# Patient Record
Sex: Female | Born: 1955
Health system: Southern US, Community
[De-identification: ages and names within clinical notes are randomized; demographics above are authoritative.]

## PROBLEM LIST (undated history)

## (undated) DIAGNOSIS — Z9221 Personal history of antineoplastic chemotherapy: Secondary | ICD-10-CM

## (undated) DIAGNOSIS — C787 Secondary malignant neoplasm of liver and intrahepatic bile duct: Secondary | ICD-10-CM

## (undated) DIAGNOSIS — Z86718 Personal history of other venous thrombosis and embolism: Secondary | ICD-10-CM

## (undated) DIAGNOSIS — G971 Other reaction to spinal and lumbar puncture: Secondary | ICD-10-CM

## (undated) DIAGNOSIS — Z933 Colostomy status: Secondary | ICD-10-CM

## (undated) DIAGNOSIS — K219 Gastro-esophageal reflux disease without esophagitis: Secondary | ICD-10-CM

## (undated) DIAGNOSIS — F32A Depression, unspecified: Secondary | ICD-10-CM

## (undated) DIAGNOSIS — Z978 Presence of other specified devices: Secondary | ICD-10-CM

## (undated) DIAGNOSIS — C19 Malignant neoplasm of rectosigmoid junction: Principal | ICD-10-CM

## (undated) DIAGNOSIS — R339 Retention of urine, unspecified: Secondary | ICD-10-CM

## (undated) DIAGNOSIS — Z7901 Long term (current) use of anticoagulants: Secondary | ICD-10-CM

## (undated) DIAGNOSIS — Z8719 Personal history of other diseases of the digestive system: Secondary | ICD-10-CM

## (undated) DIAGNOSIS — R0600 Dyspnea, unspecified: Secondary | ICD-10-CM

## (undated) DIAGNOSIS — Z86711 Personal history of pulmonary embolism: Secondary | ICD-10-CM

## (undated) DIAGNOSIS — Z8619 Personal history of other infectious and parasitic diseases: Secondary | ICD-10-CM

## (undated) DIAGNOSIS — D509 Iron deficiency anemia, unspecified: Secondary | ICD-10-CM

## (undated) DIAGNOSIS — I890 Lymphedema, not elsewhere classified: Secondary | ICD-10-CM

## (undated) DIAGNOSIS — Z872 Personal history of diseases of the skin and subcutaneous tissue: Secondary | ICD-10-CM

## (undated) DIAGNOSIS — M549 Dorsalgia, unspecified: Secondary | ICD-10-CM

## (undated) DIAGNOSIS — R21 Rash and other nonspecific skin eruption: Secondary | ICD-10-CM

## (undated) DIAGNOSIS — Z960 Presence of urogenital implants: Secondary | ICD-10-CM

## (undated) DIAGNOSIS — Q625 Duplication of ureter: Secondary | ICD-10-CM

## (undated) DIAGNOSIS — G8929 Other chronic pain: Secondary | ICD-10-CM

## (undated) DIAGNOSIS — F329 Major depressive disorder, single episode, unspecified: Secondary | ICD-10-CM

## (undated) DIAGNOSIS — T17998A Other foreign object in respiratory tract, part unspecified causing other injury, initial encounter: Secondary | ICD-10-CM

## (undated) DIAGNOSIS — Z973 Presence of spectacles and contact lenses: Secondary | ICD-10-CM

## (undated) DIAGNOSIS — D473 Essential (hemorrhagic) thrombocythemia: Secondary | ICD-10-CM

## (undated) DIAGNOSIS — F419 Anxiety disorder, unspecified: Secondary | ICD-10-CM

## (undated) DIAGNOSIS — E876 Hypokalemia: Secondary | ICD-10-CM

## (undated) DIAGNOSIS — K5909 Other constipation: Secondary | ICD-10-CM

## (undated) DIAGNOSIS — M419 Scoliosis, unspecified: Secondary | ICD-10-CM

## (undated) DIAGNOSIS — N1330 Unspecified hydronephrosis: Secondary | ICD-10-CM

## (undated) HISTORY — DX: Dorsalgia, unspecified: M54.9

## (undated) HISTORY — DX: Iron deficiency anemia, unspecified: D50.9

## (undated) HISTORY — DX: Essential (hemorrhagic) thrombocythemia: D47.3

## (undated) HISTORY — PX: WISDOM TOOTH EXTRACTION: SHX21

## (undated) HISTORY — DX: Other chronic pain: G89.29

## (undated) HISTORY — DX: Malignant neoplasm of rectosigmoid junction: C19

---

## 1998-02-20 ENCOUNTER — Ambulatory Visit (HOSPITAL_COMMUNITY): Admission: RE | Admit: 1998-02-20 | Discharge: 1998-02-20 | Payer: Self-pay | Admitting: Gynecology

## 2000-02-17 ENCOUNTER — Other Ambulatory Visit: Admission: RE | Admit: 2000-02-17 | Discharge: 2000-02-17 | Payer: Self-pay | Admitting: Gynecology

## 2000-11-04 ENCOUNTER — Ambulatory Visit (HOSPITAL_COMMUNITY): Admission: RE | Admit: 2000-11-04 | Discharge: 2000-11-04 | Payer: Self-pay | Admitting: Gynecology

## 2000-11-04 ENCOUNTER — Encounter: Payer: Self-pay | Admitting: Gynecology

## 2001-03-03 ENCOUNTER — Other Ambulatory Visit: Admission: RE | Admit: 2001-03-03 | Discharge: 2001-03-03 | Payer: Self-pay | Admitting: Gynecology

## 2002-04-20 ENCOUNTER — Other Ambulatory Visit: Admission: RE | Admit: 2002-04-20 | Discharge: 2002-04-20 | Payer: Self-pay | Admitting: Gynecology

## 2003-04-24 ENCOUNTER — Other Ambulatory Visit: Admission: RE | Admit: 2003-04-24 | Discharge: 2003-04-24 | Payer: Self-pay | Admitting: Gynecology

## 2003-05-03 ENCOUNTER — Ambulatory Visit (HOSPITAL_COMMUNITY): Admission: RE | Admit: 2003-05-03 | Discharge: 2003-05-03 | Payer: Self-pay | Admitting: Gynecology

## 2003-05-03 ENCOUNTER — Encounter: Payer: Self-pay | Admitting: Gynecology

## 2004-04-25 ENCOUNTER — Other Ambulatory Visit: Admission: RE | Admit: 2004-04-25 | Discharge: 2004-04-25 | Payer: Self-pay | Admitting: Gynecology

## 2005-06-05 ENCOUNTER — Other Ambulatory Visit: Admission: RE | Admit: 2005-06-05 | Discharge: 2005-06-05 | Payer: Self-pay | Admitting: Gynecology

## 2007-07-22 ENCOUNTER — Encounter: Admission: RE | Admit: 2007-07-22 | Discharge: 2007-08-11 | Payer: Self-pay | Admitting: Orthopedic Surgery

## 2009-12-31 ENCOUNTER — Other Ambulatory Visit: Admission: RE | Admit: 2009-12-31 | Discharge: 2009-12-31 | Payer: Self-pay | Admitting: Gynecology

## 2009-12-31 ENCOUNTER — Ambulatory Visit: Payer: Self-pay | Admitting: Gynecology

## 2010-01-08 ENCOUNTER — Ambulatory Visit: Payer: Self-pay | Admitting: Gynecology

## 2010-01-10 ENCOUNTER — Ambulatory Visit (HOSPITAL_COMMUNITY): Admission: RE | Admit: 2010-01-10 | Discharge: 2010-01-10 | Payer: Self-pay | Admitting: Gynecology

## 2010-01-23 ENCOUNTER — Encounter: Admission: RE | Admit: 2010-01-23 | Discharge: 2010-01-23 | Payer: Self-pay | Admitting: Gynecology

## 2010-04-25 ENCOUNTER — Ambulatory Visit: Payer: Self-pay | Admitting: Gynecology

## 2010-05-01 ENCOUNTER — Ambulatory Visit: Payer: Self-pay | Admitting: Oncology

## 2010-05-20 LAB — CBC WITH DIFFERENTIAL/PLATELET
Basophils Absolute: 0 10*3/uL (ref 0.0–0.1)
EOS%: 2.2 % (ref 0.0–7.0)
HCT: 40.1 % (ref 34.8–46.6)
HGB: 13.8 g/dL (ref 11.6–15.9)
MCH: 30 pg (ref 25.1–34.0)
MCV: 87.3 fL (ref 79.5–101.0)
MONO%: 7.4 % (ref 0.0–14.0)
NEUT%: 67.9 % (ref 38.4–76.8)
RDW: 15.2 % — ABNORMAL HIGH (ref 11.2–14.5)

## 2010-05-20 LAB — COMPREHENSIVE METABOLIC PANEL
AST: 16 U/L (ref 0–37)
Albumin: 4.2 g/dL (ref 3.5–5.2)
Alkaline Phosphatase: 75 U/L (ref 39–117)
Potassium: 4.5 mEq/L (ref 3.5–5.3)
Sodium: 138 mEq/L (ref 135–145)
Total Protein: 6.6 g/dL (ref 6.0–8.3)

## 2010-05-20 LAB — CHCC SMEAR

## 2010-05-23 LAB — BCR/ABL (LIO MMD)

## 2010-05-23 LAB — JAK-2 V617F

## 2010-08-15 ENCOUNTER — Ambulatory Visit: Payer: Self-pay | Admitting: Oncology

## 2010-08-19 LAB — CBC WITH DIFFERENTIAL/PLATELET
BASO%: 0.5 % (ref 0.0–2.0)
Eosinophils Absolute: 0.1 10*3/uL (ref 0.0–0.5)
LYMPH%: 25.8 % (ref 14.0–49.7)
MCHC: 34.2 g/dL (ref 31.5–36.0)
MCV: 87.2 fL (ref 79.5–101.0)
MONO%: 7.2 % (ref 0.0–14.0)
NEUT%: 63.7 % (ref 38.4–76.8)
Platelets: 553 10*3/uL — ABNORMAL HIGH (ref 145–400)
RBC: 4.75 10*6/uL (ref 3.70–5.45)

## 2010-11-18 ENCOUNTER — Other Ambulatory Visit: Payer: Self-pay | Admitting: Oncology

## 2010-11-18 ENCOUNTER — Encounter (HOSPITAL_BASED_OUTPATIENT_CLINIC_OR_DEPARTMENT_OTHER): Payer: BC Managed Care – PPO | Admitting: Oncology

## 2010-11-18 DIAGNOSIS — R799 Abnormal finding of blood chemistry, unspecified: Secondary | ICD-10-CM

## 2010-11-18 DIAGNOSIS — D473 Essential (hemorrhagic) thrombocythemia: Secondary | ICD-10-CM

## 2010-11-18 LAB — CBC WITH DIFFERENTIAL/PLATELET
BASO%: 0.4 % (ref 0.0–2.0)
Basophils Absolute: 0 10*3/uL (ref 0.0–0.1)
EOS%: 2.7 % (ref 0.0–7.0)
Eosinophils Absolute: 0.2 10*3/uL (ref 0.0–0.5)
HCT: 43.6 % (ref 34.8–46.6)
HGB: 14.4 g/dL (ref 11.6–15.9)
LYMPH%: 20 % (ref 14.0–49.7)
MCH: 28.8 pg (ref 25.1–34.0)
MCHC: 33.1 g/dL (ref 31.5–36.0)
MCV: 87.2 fL (ref 79.5–101.0)
MONO#: 0.2 10*3/uL (ref 0.1–0.9)
MONO%: 4.1 % (ref 0.0–14.0)
NEUT#: 4.2 10*3/uL (ref 1.5–6.5)
NEUT%: 72.8 % (ref 38.4–76.8)
Platelets: 553 10*3/uL — ABNORMAL HIGH (ref 145–400)
RBC: 5.01 10*6/uL (ref 3.70–5.45)
RDW: 15.4 % — ABNORMAL HIGH (ref 11.2–14.5)
WBC: 5.8 10*3/uL (ref 3.9–10.3)
lymph#: 1.2 10*3/uL (ref 0.9–3.3)

## 2010-11-18 LAB — LACTATE DEHYDROGENASE: LDH: 149 U/L (ref 94–250)

## 2010-11-18 LAB — MORPHOLOGY
PLT EST: INCREASED
RBC Comments: NORMAL

## 2010-11-18 LAB — COMPREHENSIVE METABOLIC PANEL
AST: 16 U/L (ref 0–37)
Albumin: 4.4 g/dL (ref 3.5–5.2)
Alkaline Phosphatase: 75 U/L (ref 39–117)
BUN: 9 mg/dL (ref 6–23)
Potassium: 4.9 mEq/L (ref 3.5–5.3)

## 2010-11-18 LAB — CHCC SMEAR

## 2011-02-17 ENCOUNTER — Other Ambulatory Visit: Payer: Self-pay | Admitting: Oncology

## 2011-02-17 ENCOUNTER — Encounter (HOSPITAL_BASED_OUTPATIENT_CLINIC_OR_DEPARTMENT_OTHER): Payer: BC Managed Care – PPO | Admitting: Oncology

## 2011-02-17 DIAGNOSIS — D473 Essential (hemorrhagic) thrombocythemia: Secondary | ICD-10-CM

## 2011-02-17 LAB — CBC WITH DIFFERENTIAL/PLATELET
BASO%: 0.4 % (ref 0.0–2.0)
Basophils Absolute: 0 10e3/uL (ref 0.0–0.1)
EOS%: 2.5 % (ref 0.0–7.0)
Eosinophils Absolute: 0.1 10e3/uL (ref 0.0–0.5)
HCT: 39.4 % (ref 34.8–46.6)
HGB: 13.3 g/dL (ref 11.6–15.9)
LYMPH%: 27.1 % (ref 14.0–49.7)
MCH: 29.6 pg (ref 25.1–34.0)
MCHC: 33.8 g/dL (ref 31.5–36.0)
MCV: 87.7 fL (ref 79.5–101.0)
MONO#: 0.4 10e3/uL (ref 0.1–0.9)
MONO%: 7 % (ref 0.0–14.0)
NEUT#: 3.5 10e3/uL (ref 1.5–6.5)
NEUT%: 63 % (ref 38.4–76.8)
Platelets: 509 10e3/uL — ABNORMAL HIGH (ref 145–400)
RBC: 4.5 10e6/uL (ref 3.70–5.45)
RDW: 15.9 % — ABNORMAL HIGH (ref 11.2–14.5)
WBC: 5.6 10e3/uL (ref 3.9–10.3)
lymph#: 1.5 10e3/uL (ref 0.9–3.3)

## 2011-05-19 ENCOUNTER — Other Ambulatory Visit: Payer: Self-pay | Admitting: Oncology

## 2011-05-19 ENCOUNTER — Encounter (HOSPITAL_BASED_OUTPATIENT_CLINIC_OR_DEPARTMENT_OTHER): Payer: BC Managed Care – PPO | Admitting: Oncology

## 2011-05-19 DIAGNOSIS — D473 Essential (hemorrhagic) thrombocythemia: Secondary | ICD-10-CM

## 2011-05-19 LAB — CBC WITH DIFFERENTIAL/PLATELET
Basophils Absolute: 0.1 10*3/uL (ref 0.0–0.1)
EOS%: 2.4 % (ref 0.0–7.0)
HCT: 40.7 % (ref 34.8–46.6)
HGB: 13.9 g/dL (ref 11.6–15.9)
LYMPH%: 20.7 % (ref 14.0–49.7)
MCH: 30.1 pg (ref 25.1–34.0)
MCV: 88.2 fL (ref 79.5–101.0)
MONO%: 6.5 % (ref 0.0–14.0)
NEUT%: 69 % (ref 38.4–76.8)
Platelets: 505 10*3/uL — ABNORMAL HIGH (ref 145–400)
RDW: 15.1 % — ABNORMAL HIGH (ref 11.2–14.5)

## 2011-05-19 LAB — MORPHOLOGY: PLT EST: INCREASED

## 2011-05-19 LAB — COMPREHENSIVE METABOLIC PANEL
ALT: 13 U/L (ref 0–35)
Albumin: 4 g/dL (ref 3.5–5.2)
CO2: 26 mEq/L (ref 19–32)
Chloride: 104 mEq/L (ref 96–112)
Glucose, Bld: 82 mg/dL (ref 70–99)
Potassium: 4.6 mEq/L (ref 3.5–5.3)
Sodium: 137 mEq/L (ref 135–145)
Total Protein: 6.5 g/dL (ref 6.0–8.3)

## 2011-05-19 LAB — LACTATE DEHYDROGENASE: LDH: 150 U/L (ref 94–250)

## 2011-08-20 ENCOUNTER — Telehealth: Payer: Self-pay | Admitting: Oncology

## 2011-08-20 ENCOUNTER — Telehealth: Payer: Self-pay | Admitting: *Deleted

## 2011-08-20 ENCOUNTER — Other Ambulatory Visit: Payer: Self-pay | Admitting: Oncology

## 2011-08-20 ENCOUNTER — Other Ambulatory Visit (HOSPITAL_BASED_OUTPATIENT_CLINIC_OR_DEPARTMENT_OTHER): Payer: BC Managed Care – PPO | Admitting: Lab

## 2011-08-20 DIAGNOSIS — R799 Abnormal finding of blood chemistry, unspecified: Secondary | ICD-10-CM

## 2011-08-20 DIAGNOSIS — D473 Essential (hemorrhagic) thrombocythemia: Secondary | ICD-10-CM

## 2011-08-20 LAB — CBC WITH DIFFERENTIAL/PLATELET
BASO%: 0.6 % (ref 0.0–2.0)
EOS%: 2.3 % (ref 0.0–7.0)
MCH: 29.4 pg (ref 25.1–34.0)
MCHC: 33.6 g/dL (ref 31.5–36.0)
MCV: 87.4 fL (ref 79.5–101.0)
MONO%: 6 % (ref 0.0–14.0)
RDW: 15.5 % — ABNORMAL HIGH (ref 11.2–14.5)
lymph#: 1.5 10*3/uL (ref 0.9–3.3)

## 2011-08-20 NOTE — Telephone Encounter (Signed)
Called pt again and relayed message below by Dr. Gaylyn Rong.  Instructed her to keep lab appts as scheduled and she verbalized understanding.

## 2011-08-20 NOTE — Telephone Encounter (Deleted)
Message copied by Wende Mott on Wed Aug 20, 2011  5:07 PM ------      Message from: HA, Raliegh Ip T      Created: Wed Aug 20, 2011  2:02 PM       Please call pt.  She has stable Plt.  She has known ET but no Hydrea yet since no history of clot.   Continue previously arranged appointments.   Thanks.

## 2011-08-20 NOTE — Telephone Encounter (Signed)
Message copied by Wende Mott on Wed Aug 20, 2011  2:08 PM ------      Message from: HA, Raliegh Ip T      Created: Wed Aug 20, 2011  2:02 PM       Please call pt.  She has stable Plt.  She has known ET but no Hydrea yet since no history of clot.   Continue previously arranged appointments.   Thanks.

## 2011-08-20 NOTE — Telephone Encounter (Signed)
gve the pt her feb,may,sept 2013 appt calendar.

## 2011-11-04 ENCOUNTER — Encounter: Payer: Self-pay | Admitting: *Deleted

## 2011-11-10 ENCOUNTER — Encounter: Payer: Self-pay | Admitting: Oncology

## 2011-11-10 DIAGNOSIS — D473 Essential (hemorrhagic) thrombocythemia: Secondary | ICD-10-CM | POA: Insufficient documentation

## 2011-11-17 ENCOUNTER — Telehealth: Payer: Self-pay | Admitting: Oncology

## 2011-11-17 ENCOUNTER — Other Ambulatory Visit (HOSPITAL_BASED_OUTPATIENT_CLINIC_OR_DEPARTMENT_OTHER): Payer: BC Managed Care – PPO | Admitting: Lab

## 2011-11-17 ENCOUNTER — Ambulatory Visit (HOSPITAL_BASED_OUTPATIENT_CLINIC_OR_DEPARTMENT_OTHER): Payer: BC Managed Care – PPO | Admitting: Oncology

## 2011-11-17 VITALS — BP 123/82 | HR 62 | Temp 97.1°F | Ht 67.0 in | Wt 141.9 lb

## 2011-11-17 DIAGNOSIS — D473 Essential (hemorrhagic) thrombocythemia: Secondary | ICD-10-CM

## 2011-11-17 LAB — CBC WITH DIFFERENTIAL/PLATELET
Basophils Absolute: 0 10*3/uL (ref 0.0–0.1)
EOS%: 2.6 % (ref 0.0–7.0)
HGB: 14.2 g/dL (ref 11.6–15.9)
MCH: 29.7 pg (ref 25.1–34.0)
MCV: 87.3 fL (ref 79.5–101.0)
MONO%: 5.8 % (ref 0.0–14.0)
NEUT#: 3.6 10*3/uL (ref 1.5–6.5)
RBC: 4.77 10*6/uL (ref 3.70–5.45)
RDW: 15.7 % — ABNORMAL HIGH (ref 11.2–14.5)
lymph#: 1.5 10*3/uL (ref 0.9–3.3)

## 2011-11-17 LAB — COMPREHENSIVE METABOLIC PANEL
ALT: 12 U/L (ref 0–35)
AST: 16 U/L (ref 0–37)
Albumin: 4.2 g/dL (ref 3.5–5.2)
Alkaline Phosphatase: 69 U/L (ref 39–117)
BUN: 9 mg/dL (ref 6–23)
Calcium: 9.4 mg/dL (ref 8.4–10.5)
Chloride: 103 mEq/L (ref 96–112)
Potassium: 5 mEq/L (ref 3.5–5.3)
Sodium: 139 mEq/L (ref 135–145)
Total Protein: 6.8 g/dL (ref 6.0–8.3)

## 2011-11-17 LAB — LACTATE DEHYDROGENASE: LDH: 153 U/L (ref 94–250)

## 2011-11-17 NOTE — Telephone Encounter (Signed)
Gv pt appts for june-feb2014

## 2011-11-17 NOTE — Progress Notes (Signed)
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  CURRENT THERAPY:  Watchful observation.  INTERVAL HISTORY: Carolyn Barton 56 y.o. female returns for regular follow up by herself.  She reports feeling well.  She denies head ache, visual changes, chest pain, abdominal pain, bleeding symptoms, lower extremity swelling/pain.    Patient denies fatigue, headache, visual changes, confusion, drenching night sweats, palpable lymph node swelling, mucositis, odynophagia, dysphagia, nausea vomiting, jaundice, chest pain, palpitation, shortness of breath, dyspnea on exertion, productive cough, gum bleeding, epistaxis, hematemesis, hemoptysis, abdominal pain, abdominal swelling, early satiety, melena, hematochezia, hematuria, skin rash, spontaneous bleeding, joint swelling, joint pain, heat or cold intolerance, bowel bladder incontinence, back pain, focal motor weakness, paresthesia, depression, suicidal or homocidal ideation, feeling hopelessness.   Past Medical History  Diagnosis Date  . Essential thrombocytosis    CURRENT MEDS:  ASA 81mg  PO daily.    ALLERGIES:   has no known allergies.  REVIEW OF SYSTEMS:  The rest of the 14-point review of system was negative.   Filed Vitals:   11/17/11 1204  BP: 123/82  Pulse: 62  Temp: 97.1 F (36.2 C)   Wt Readings from Last 3 Encounters:  11/17/11 141 lb 14.4 oz (64.365 kg)  05/19/11 141 lb 4.8 oz (64.093 kg)   ECOG Performance status: 0  PHYSICAL EXAMINATION:  General: thin-appearing woman in no acute distress.  Eyes:  no scleral icterus.  ENT:  There were no oropharyngeal lesions.  Neck was without thyromegaly.  Lymphatics:  Negative cervical, supraclavicular or axillary adenopathy.  Respiratory: lungs were clear bilaterally without wheezing or crackles.  Cardiovascular:  Regular rate and rhythm, S1/S2, without murmur, rub or gallop.  There was no pedal  edema.  GI:  abdomen was soft, flat, nontender, nondistended, without organomegaly.  Muscoloskeletal:  no spinal tenderness of palpation of vertebral spine.  Skin exam was without echymosis, petichae.  Neuro exam was nonfocal.  Patient was able to get on and off exam table without assistance.  Gait was normal.  Patient was alerted and oriented.  Attention was good.   Language was appropriate.  Mood was normal without depression.  Speech was not pressured.  Thought content was not tangential.     LABORATORY/RADIOLOGY DATA:  Lab Results  Component Value Date   WBC 5.6 11/17/2011   HGB 14.2 11/17/2011   HCT 41.7 11/17/2011   PLT 538* 11/17/2011   GLUCOSE 82 05/19/2011   ALT 13 05/19/2011   AST 18 05/19/2011   NA 137 05/19/2011   K 4.6 05/19/2011   CL 104 05/19/2011   CREATININE 0.66 05/19/2011   BUN 11 05/19/2011   CO2 26 05/19/2011    ASSESSMENT AND PLAN:   1. Essential thrombocytosis, JAK2 mutation negative:  No evidence of disease progression to fibrosis or AML.  As she is younger than 20 and never had an episode of thrombosis, there is no indication to start Hydrea at this time.  I advised her to continue taking Aspirin.  I will continue to observe with CBC in about 4 and then 8 months.  I'll see her in about 1 year.  2. Age-appropriate cancer screening. She reported that she is up to date with mammogram and Papsmear.  She is working with her insurance company to get preapproval for screening colonoscopy.

## 2012-02-09 ENCOUNTER — Other Ambulatory Visit: Payer: BC Managed Care – PPO

## 2012-03-11 ENCOUNTER — Telehealth: Payer: Self-pay

## 2012-03-11 ENCOUNTER — Other Ambulatory Visit (HOSPITAL_BASED_OUTPATIENT_CLINIC_OR_DEPARTMENT_OTHER): Payer: BC Managed Care – PPO | Admitting: Lab

## 2012-03-11 DIAGNOSIS — D473 Essential (hemorrhagic) thrombocythemia: Secondary | ICD-10-CM

## 2012-03-11 LAB — CBC WITH DIFFERENTIAL/PLATELET
BASO%: 1.2 % (ref 0.0–2.0)
Basophils Absolute: 0.1 10*3/uL (ref 0.0–0.1)
Eosinophils Absolute: 0.1 10*3/uL (ref 0.0–0.5)
HCT: 40.5 % (ref 34.8–46.6)
HGB: 13.8 g/dL (ref 11.6–15.9)
LYMPH%: 21.2 % (ref 14.0–49.7)
MCHC: 34 g/dL (ref 31.5–36.0)
MONO#: 0.3 10*3/uL (ref 0.1–0.9)
NEUT#: 3.8 10*3/uL (ref 1.5–6.5)
NEUT%: 69.1 % (ref 38.4–76.8)
Platelets: 517 10*3/uL — ABNORMAL HIGH (ref 145–400)
WBC: 5.5 10*3/uL (ref 3.9–10.3)
lymph#: 1.2 10*3/uL (ref 0.9–3.3)

## 2012-03-11 LAB — MORPHOLOGY: PLT EST: INCREASED

## 2012-03-11 NOTE — Telephone Encounter (Signed)
Message copied by Kallie Locks on Thu Mar 11, 2012  4:10 PM ------      Message from: HA, Raliegh Ip T      Created: Thu Mar 11, 2012  1:37 PM       Please call patient. Her thrombocytosis is stable. I again recommended watchful observation. Please keep previously range appointments.

## 2012-06-02 ENCOUNTER — Ambulatory Visit: Payer: BC Managed Care – PPO | Admitting: Oncology

## 2012-06-02 ENCOUNTER — Other Ambulatory Visit: Payer: BC Managed Care – PPO

## 2012-07-12 ENCOUNTER — Other Ambulatory Visit: Payer: BC Managed Care – PPO | Admitting: Lab

## 2012-07-14 ENCOUNTER — Other Ambulatory Visit (HOSPITAL_BASED_OUTPATIENT_CLINIC_OR_DEPARTMENT_OTHER): Payer: BC Managed Care – PPO | Admitting: Lab

## 2012-07-14 DIAGNOSIS — D473 Essential (hemorrhagic) thrombocythemia: Secondary | ICD-10-CM

## 2012-07-14 LAB — CBC WITH DIFFERENTIAL/PLATELET
BASO%: 1.2 % (ref 0.0–2.0)
Eosinophils Absolute: 0.1 10*3/uL (ref 0.0–0.5)
HCT: 39.9 % (ref 34.8–46.6)
HGB: 13.5 g/dL (ref 11.6–15.9)
LYMPH%: 29 % (ref 14.0–49.7)
MCHC: 33.8 g/dL (ref 31.5–36.0)
MONO#: 0.4 10*3/uL (ref 0.1–0.9)
NEUT#: 3.3 10*3/uL (ref 1.5–6.5)
NEUT%: 59.7 % (ref 38.4–76.8)
Platelets: 474 10*3/uL — ABNORMAL HIGH (ref 145–400)
WBC: 5.5 10*3/uL (ref 3.9–10.3)
lymph#: 1.6 10*3/uL (ref 0.9–3.3)

## 2012-07-16 ENCOUNTER — Telehealth: Payer: Self-pay

## 2012-07-16 NOTE — Telephone Encounter (Signed)
Message copied by Kallie Locks on Fri Jul 16, 2012 10:54 AM ------      Message from: HA, Raliegh Ip T      Created: Thu Jul 15, 2012  1:27 PM       Please call pt.  Her essential thrombocytosis is slightly better than before.  Continue observation for now.  No treatment is needed, unless she develops blood clots.  Thanks.

## 2012-07-29 ENCOUNTER — Telehealth: Payer: Self-pay

## 2012-07-29 NOTE — Telephone Encounter (Signed)
Message copied by Kallie Locks on Thu Jul 29, 2012 11:26 AM ------      Message from: HA, Raliegh Ip T      Created: Thu Jul 15, 2012  1:27 PM       Please call pt.  Her essential thrombocytosis is slightly better than before.  Continue observation for now.  No treatment is needed, unless she develops blood clots.  Thanks.

## 2012-09-29 DIAGNOSIS — G971 Other reaction to spinal and lumbar puncture: Secondary | ICD-10-CM

## 2012-09-29 HISTORY — DX: Other reaction to spinal and lumbar puncture: G97.1

## 2012-11-11 HISTORY — PX: LUMBAR LAMINECTOMY: SHX95

## 2012-11-16 ENCOUNTER — Telehealth: Payer: Self-pay | Admitting: Oncology

## 2012-11-16 NOTE — Telephone Encounter (Signed)
pt called to r/s just had back surgery and would like to r/s

## 2012-11-17 ENCOUNTER — Ambulatory Visit: Payer: BC Managed Care – PPO | Admitting: Oncology

## 2012-11-17 ENCOUNTER — Other Ambulatory Visit: Payer: BC Managed Care – PPO | Admitting: Lab

## 2012-12-01 ENCOUNTER — Ambulatory Visit (HOSPITAL_BASED_OUTPATIENT_CLINIC_OR_DEPARTMENT_OTHER): Payer: BC Managed Care – PPO | Admitting: Oncology

## 2012-12-01 ENCOUNTER — Other Ambulatory Visit (HOSPITAL_BASED_OUTPATIENT_CLINIC_OR_DEPARTMENT_OTHER): Payer: BC Managed Care – PPO | Admitting: Lab

## 2012-12-01 ENCOUNTER — Telehealth: Payer: Self-pay | Admitting: Oncology

## 2012-12-01 VITALS — BP 131/81 | HR 73 | Temp 97.5°F | Resp 20 | Ht 67.0 in | Wt 146.6 lb

## 2012-12-01 DIAGNOSIS — R51 Headache: Secondary | ICD-10-CM

## 2012-12-01 DIAGNOSIS — D473 Essential (hemorrhagic) thrombocythemia: Secondary | ICD-10-CM

## 2012-12-01 LAB — CBC WITH DIFFERENTIAL/PLATELET
BASO%: 0.6 % (ref 0.0–2.0)
EOS%: 3.9 % (ref 0.0–7.0)
HCT: 42.5 % (ref 34.8–46.6)
MCH: 29.8 pg (ref 25.1–34.0)
MCHC: 34.5 g/dL (ref 31.5–36.0)
NEUT%: 59.2 % (ref 38.4–76.8)
RBC: 4.93 10*6/uL (ref 3.70–5.45)
RDW: 15.3 % — ABNORMAL HIGH (ref 11.2–14.5)
lymph#: 1.8 10*3/uL (ref 0.9–3.3)

## 2012-12-01 LAB — COMPREHENSIVE METABOLIC PANEL (CC13)
ALT: 21 U/L (ref 0–55)
AST: 15 U/L (ref 5–34)
Calcium: 10.2 mg/dL (ref 8.4–10.4)
Chloride: 101 mEq/L (ref 98–107)
Creatinine: 0.8 mg/dL (ref 0.6–1.1)

## 2012-12-01 NOTE — Progress Notes (Signed)
[facility] OFFICE PROGRESS NOTE  Cc:  Dorian Heckle, MD  DIAGNOSIS:  JAK-2 positive Essential Thrombocytosis.  (BCR/ABL negative per testing 05/20/2010)  CURRENT THERAPY:  Watchful observation.  INTERVAL HISTORY: Carolyn Barton 57 y.o. female returns for regular follow up by herself.  She underwent some form of back surgery last week due to painful cyst.  She has been having severe headache the last few days.  She has drunk plenty of fluid and stayed in bed without much relief.  She has been taking some OTC pain med with only transient relief.  Headache is diffuse, intermittent; no relation to activities.  She denied fever, neck rigidity, photophobia.  With respect to ET, she denied chest pain, SOB, leg swelling, abd pain.  The rest of the 14-point review of system was negative.    Past Medical History  Diagnosis Date  . Essential thrombocytosis    CURRENT MEDS:  ASA 81mg  PO daily.    ALLERGIES:  has No Known Allergies.  REVIEW OF SYSTEMS:  The rest of the 14-point review of system was negative.   Filed Vitals:   12/01/12 0936  BP: 131/81  Pulse: 73  Temp: 97.5 F (36.4 C)  Resp: 20   Wt Readings from Last 3 Encounters:  12/01/12 146 lb 9.6 oz (66.497 kg)  11/17/11 141 lb 14.4 oz (64.365 kg)  05/19/11 141 lb 4.8 oz (64.093 kg)   ECOG Performance status: 0  PHYSICAL EXAMINATION:  General: thin-appearing woman in no acute distress.  Eyes:  no scleral icterus.  ENT:  There were no oropharyngeal lesions.  Neck was without thyromegaly. There was nuchal rigidity.   Lymphatics:  Negative cervical, supraclavicular or axillary adenopathy.  Respiratory: lungs were clear bilaterally without wheezing or crackles.  Cardiovascular:  Regular rate and rhythm, S1/S2, without murmur, rub or gallop.  There was no pedal edema.  GI:  abdomen was soft, flat, nontender, nondistended, without organomegaly.  Muscoloskeletal:  no spinal tenderness of palpation of vertebral spine.   Skin exam was without echymosis, petichae.  Neuro exam was nonfocal.  Patient was able to get on and off exam table without assistance.  Gait was normal.  Patient was alerted and oriented.  Attention was good.   Language was appropriate.  Mood was normal without depression.  Speech was not pressured.  Thought content was not tangential.    LABORATORY/RADIOLOGY DATA:  Lab Results  Component Value Date   WBC 6.6 12/01/2012   HGB 14.7 12/01/2012   HCT 42.5 12/01/2012   PLT 538* 12/01/2012   GLUCOSE 94 12/01/2012   ALT 21 12/01/2012   AST 15 12/01/2012   NA 136 12/01/2012   K 4.9 12/01/2012   CL 101 12/01/2012   CREATININE 0.8 12/01/2012   BUN 12.5 12/01/2012   CO2 26 12/01/2012    ASSESSMENT AND PLAN:   1. Essential thrombocytosis, JAK2 mutation positive:  There has been on evidence of thrombotic complication.  She is still <65.  There is no indication for Hydrea.  Her Plt has slightly increased most likely due to recent surgery.  She expressed informed understanding and agreed with observation for now.  2. Headache:  Query if CSF fluid leakage.  She has appointment with her neurosurgeon today.  I advised her to inquire about blood patch.  3. Age-appropriate cancer screening. She reported that she is up to date with mammogram and Papsmear.   4. Follow up:  As she has been stable, I recommended lab-only appointment in  about 6 months and return visit in about 1 year.

## 2012-12-01 NOTE — Telephone Encounter (Signed)
Carolyn Barton and advise on Sept and March 2015 appt... Barton ok and aware

## 2012-12-01 NOTE — Patient Instructions (Addendum)
1.  Essential thrombocytosis. 2.  Treatment:  Observation.  In patients with documented blood clots or older than 65, we should start Hydrea to decrease risk of blood clot. 3.  Start Aspirin 81mg  by mouth once daily to decrease risk of blood clot. 4.  Follow up:  In about 1 year.

## 2012-12-06 ENCOUNTER — Other Ambulatory Visit: Payer: BC Managed Care – PPO

## 2012-12-06 ENCOUNTER — Other Ambulatory Visit: Payer: Self-pay | Admitting: Neurosurgery

## 2012-12-06 DIAGNOSIS — M7130 Other bursal cyst, unspecified site: Secondary | ICD-10-CM

## 2012-12-07 ENCOUNTER — Other Ambulatory Visit: Payer: BC Managed Care – PPO

## 2012-12-07 ENCOUNTER — Ambulatory Visit
Admission: RE | Admit: 2012-12-07 | Discharge: 2012-12-07 | Disposition: A | Discharge status: Home / Self Care | Payer: BC Managed Care – PPO | Source: Ambulatory Visit | Attending: Neurosurgery | Admitting: Neurosurgery

## 2012-12-07 DIAGNOSIS — M7130 Other bursal cyst, unspecified site: Secondary | ICD-10-CM

## 2012-12-07 MED ORDER — GADOBENATE DIMEGLUMINE 529 MG/ML IV SOLN
13.0000 mL | Freq: Once | INTRAVENOUS | Status: AC | PRN
  Administered 2012-12-07: 13 mL via INTRAVENOUS

## 2012-12-09 ENCOUNTER — Other Ambulatory Visit: Payer: Self-pay | Admitting: Neurosurgery

## 2012-12-10 ENCOUNTER — Encounter (HOSPITAL_COMMUNITY): Payer: Self-pay | Admitting: Pharmacy Technician

## 2012-12-17 ENCOUNTER — Encounter (HOSPITAL_COMMUNITY)
Admission: RE | Admit: 2012-12-17 | Discharge: 2012-12-17 | Disposition: A | Discharge status: Home / Self Care | Payer: BC Managed Care – PPO | Source: Ambulatory Visit | Attending: Neurosurgery | Admitting: Neurosurgery

## 2012-12-17 ENCOUNTER — Encounter: Payer: Self-pay | Admitting: Oncology

## 2012-12-17 ENCOUNTER — Encounter (HOSPITAL_COMMUNITY): Payer: Self-pay

## 2012-12-17 LAB — BASIC METABOLIC PANEL
CO2: 26 mEq/L (ref 19–32)
Calcium: 9.8 mg/dL (ref 8.4–10.5)
Glucose, Bld: 95 mg/dL (ref 70–99)
Sodium: 136 mEq/L (ref 135–145)

## 2012-12-17 LAB — CBC
MCH: 29.9 pg (ref 26.0–34.0)
MCV: 86.7 fL (ref 78.0–100.0)
Platelets: 589 10*3/uL — ABNORMAL HIGH (ref 150–400)
RBC: 4.98 MIL/uL (ref 3.87–5.11)

## 2012-12-17 NOTE — Pre-Procedure Instructions (Signed)
DOLOREZ JEFFREY  12/17/2012   Your procedure is scheduled on:  Tuesday December 21, 2012  Report to [facility] at 1030 AM.  Call this number if you have problems the morning of surgery: [phone]   Remember:   Do not eat food or drink liquids after midnight.   Take these medicines the morning of surgery with A SIP OF WATER: Hydrocodone-Acetaminophen if needed for pain.   Do not wear jewelry, make-up or nail polish.  Do not wear lotions, powders, or perfumes. You may wear deodorant.  Do not shave 48 hours prior to surgery.   Do not bring valuables to the hospital.  Contacts, dentures or bridgework may not be worn into surgery.  Leave suitcase in the car. After surgery it may be brought to your room.  For patients admitted to the hospital, checkout time is 11:00 AM the day of  discharge.   Patients discharged the day of surgery will not be allowed to drive  home.    Special Instructions: Shower using CHG 2 nights before surgery and the night before surgery.  If you shower the day of surgery use CHG.  Use special wash - you have one bottle of CHG for all showers.  You should use approximately 1/3 of the bottle for each shower.   Please read over the following fact sheets that you were given: Pain Booklet, Coughing and Deep Breathing, MRSA Information and Surgical Site Infection Prevention

## 2012-12-18 ENCOUNTER — Inpatient Hospital Stay (HOSPITAL_COMMUNITY)
Admission: AD | Admit: 2012-12-18 | Discharge: 2012-12-23 | DRG: 837 | Disposition: A | Discharge status: Home / Self Care | Payer: BC Managed Care – PPO | Source: Ambulatory Visit | Attending: Neurosurgery | Admitting: Neurosurgery

## 2012-12-18 ENCOUNTER — Encounter (HOSPITAL_COMMUNITY): Payer: Self-pay | Admitting: *Deleted

## 2012-12-18 DIAGNOSIS — Z87891 Personal history of nicotine dependence: Secondary | ICD-10-CM

## 2012-12-18 DIAGNOSIS — Y834 Other reconstructive surgery as the cause of abnormal reaction of the patient, or of later complication, without mention of misadventure at the time of the procedure: Secondary | ICD-10-CM | POA: Diagnosis present

## 2012-12-18 DIAGNOSIS — Z79899 Other long term (current) drug therapy: Secondary | ICD-10-CM

## 2012-12-18 DIAGNOSIS — Z791 Long term (current) use of non-steroidal anti-inflammatories (NSAID): Secondary | ICD-10-CM

## 2012-12-18 DIAGNOSIS — G988 Other disorders of nervous system: Principal | ICD-10-CM | POA: Diagnosis present

## 2012-12-18 DIAGNOSIS — Y92009 Unspecified place in unspecified non-institutional (private) residence as the place of occurrence of the external cause: Secondary | ICD-10-CM

## 2012-12-18 DIAGNOSIS — D473 Essential (hemorrhagic) thrombocythemia: Secondary | ICD-10-CM | POA: Diagnosis present

## 2012-12-18 LAB — CBC WITH DIFFERENTIAL/PLATELET
Hemoglobin: 13.2 g/dL (ref 12.0–15.0)
Lymphocytes Relative: 10 % — ABNORMAL LOW (ref 12–46)
Lymphs Abs: 1.1 10*3/uL (ref 0.7–4.0)
MCH: 28.7 pg (ref 26.0–34.0)
Monocytes Relative: 5 % (ref 3–12)
Neutrophils Relative %: 84 % — ABNORMAL HIGH (ref 43–77)
Platelets: 557 10*3/uL — ABNORMAL HIGH (ref 150–400)
RBC: 4.6 MIL/uL (ref 3.87–5.11)
WBC: 11 10*3/uL — ABNORMAL HIGH (ref 4.0–10.5)

## 2012-12-18 LAB — BASIC METABOLIC PANEL
CO2: 20 mEq/L (ref 19–32)
Calcium: 9.4 mg/dL (ref 8.4–10.5)
GFR calc non Af Amer: >90 mL/min (ref >90)
Glucose, Bld: 98 mg/dL (ref 70–99)
Potassium: 3.7 mEq/L (ref 3.5–5.1)
Sodium: 130 mEq/L — ABNORMAL LOW (ref 135–145)

## 2012-12-18 LAB — URINALYSIS, ROUTINE W REFLEX MICROSCOPIC
Glucose, UA: NEGATIVE mg/dL
Hgb urine dipstick: NEGATIVE
Leukocytes, UA: NEGATIVE
Specific Gravity, Urine: 1.015 (ref 1.005–1.030)
pH: 7 (ref 5.0–8.0)

## 2012-12-18 LAB — PROTIME-INR: Prothrombin Time: 13 seconds (ref 11.6–15.2)

## 2012-12-18 LAB — APTT: aPTT: 35 seconds (ref 24–37)

## 2012-12-18 MED ORDER — CEFAZOLIN SODIUM 1-5 GM-% IV SOLN
1.0000 g | Freq: Three times a day (TID) | INTRAVENOUS | Status: DC
  Administered 2012-12-18 – 2012-12-21 (×9): 1 g via INTRAVENOUS
  Administered 2012-12-21: 2 g via INTRAVENOUS
  Administered 2012-12-21 – 2012-12-23 (×4): 1 g via INTRAVENOUS
  Filled 2012-12-18 (×15): qty 50

## 2012-12-18 MED ORDER — SENNOSIDES-DOCUSATE SODIUM 8.6-50 MG PO TABS: 1.0000 | ORAL_TABLET | Freq: Every evening | ORAL | Status: DC | PRN

## 2012-12-18 MED ORDER — NALOXONE HCL 0.4 MG/ML IJ SOLN: 0.4000 mg | INTRAMUSCULAR | Status: DC | PRN

## 2012-12-18 MED ORDER — ACETAMINOPHEN 650 MG RE SUPP: 650.0000 mg | RECTAL | Status: DC | PRN

## 2012-12-18 MED ORDER — PHENOL 1.4 % MT LIQD: 1.0000 | OROMUCOSAL | Status: DC | PRN

## 2012-12-18 MED ORDER — ONDANSETRON HCL 4 MG/2ML IJ SOLN
4.0000 mg | INTRAMUSCULAR | Status: DC | PRN
  Administered 2012-12-18 – 2012-12-19 (×3): 4 mg via INTRAVENOUS
  Filled 2012-12-18 (×3): qty 2

## 2012-12-18 MED ORDER — SENNA 8.6 MG PO TABS
1.0000 | ORAL_TABLET | Freq: Two times a day (BID) | ORAL | Status: DC
  Administered 2012-12-18 – 2012-12-23 (×9): 8.6 mg via ORAL
  Filled 2012-12-18 (×14): qty 1

## 2012-12-18 MED ORDER — MENTHOL 3 MG MT LOZG: 1.0000 | LOZENGE | OROMUCOSAL | Status: DC | PRN

## 2012-12-18 MED ORDER — OXYCODONE HCL 5 MG PO TABS
5.0000 mg | ORAL_TABLET | ORAL | Status: DC | PRN
  Administered 2012-12-18 – 2012-12-23 (×6): 5 mg via ORAL
  Filled 2012-12-18 (×6): qty 1

## 2012-12-18 MED ORDER — KETOROLAC TROMETHAMINE 30 MG/ML IJ SOLN
30.0000 mg | Freq: Four times a day (QID) | INTRAMUSCULAR | Status: DC
  Administered 2012-12-18 – 2012-12-23 (×15): 30 mg via INTRAVENOUS
  Filled 2012-12-18 (×24): qty 1

## 2012-12-18 MED ORDER — SODIUM CHLORIDE 0.9 % IV SOLN
250.0000 mL | INTRAVENOUS | Status: DC
  Administered 2012-12-18 – 2012-12-20 (×3): 250 mL via INTRAVENOUS

## 2012-12-18 MED ORDER — DIAZEPAM 5 MG PO TABS
5.0000 mg | ORAL_TABLET | Freq: Four times a day (QID) | ORAL | Status: DC | PRN
  Administered 2012-12-22 – 2012-12-23 (×4): 5 mg via ORAL
  Filled 2012-12-18 (×4): qty 1

## 2012-12-18 MED ORDER — DIPHENHYDRAMINE HCL 12.5 MG/5ML PO ELIX: 12.5000 mg | ORAL_SOLUTION | Freq: Four times a day (QID) | ORAL | Status: DC | PRN

## 2012-12-18 MED ORDER — SODIUM CHLORIDE 0.9 % IJ SOLN
3.0000 mL | Freq: Two times a day (BID) | INTRAMUSCULAR | Status: DC
  Administered 2012-12-21 – 2012-12-22 (×2): 3 mL via INTRAVENOUS

## 2012-12-18 MED ORDER — DIPHENHYDRAMINE HCL 50 MG/ML IJ SOLN: 12.5000 mg | Freq: Four times a day (QID) | INTRAMUSCULAR | Status: DC | PRN

## 2012-12-18 MED ORDER — SODIUM CHLORIDE 0.9 % IJ SOLN: 9.0000 mL | INTRAMUSCULAR | Status: DC | PRN

## 2012-12-18 MED ORDER — HYDROMORPHONE 0.3 MG/ML IV SOLN
INTRAVENOUS | Status: DC
  Administered 2012-12-18: 17:00:00 via INTRAVENOUS
  Administered 2012-12-19: 1.5 mg via INTRAVENOUS
  Administered 2012-12-19: 0.83 mg via INTRAVENOUS
  Administered 2012-12-19: 0.3 mg via INTRAVENOUS
  Administered 2012-12-19: 1.5 mg via INTRAVENOUS
  Administered 2012-12-19: 0.3 mg via INTRAVENOUS
  Administered 2012-12-19: 1.5 mg via INTRAVENOUS
  Administered 2012-12-19: 4.5 mg via INTRAVENOUS
  Administered 2012-12-19: 0.6 mg via INTRAVENOUS
  Administered 2012-12-20: 0.9 mg via INTRAVENOUS
  Administered 2012-12-20: 0.6 mg via INTRAVENOUS
  Administered 2012-12-20 (×2): 0.3 mg via INTRAVENOUS
  Administered 2012-12-20: 0.6 mg via INTRAVENOUS
  Administered 2012-12-21: 0.1 mg via INTRAVENOUS
  Filled 2012-12-18 (×3): qty 25

## 2012-12-18 MED ORDER — ACETAMINOPHEN 325 MG PO TABS: 650.0000 mg | ORAL_TABLET | ORAL | Status: DC | PRN

## 2012-12-18 MED ORDER — SODIUM CHLORIDE 0.9 % IJ SOLN: 3.0000 mL | INTRAMUSCULAR | Status: DC | PRN

## 2012-12-18 MED ORDER — POTASSIUM CHLORIDE IN NACL 20-0.9 MEQ/L-% IV SOLN
INTRAVENOUS | Status: DC
  Administered 2012-12-18 – 2012-12-21 (×5): via INTRAVENOUS
  Filled 2012-12-18 (×12): qty 1000

## 2012-12-18 MED ORDER — BISACODYL 5 MG PO TBEC: 5.0000 mg | DELAYED_RELEASE_TABLET | Freq: Every day | ORAL | Status: DC | PRN

## 2012-12-18 NOTE — Progress Notes (Signed)
Pt was a direct admission from the ED for spinal leak.  Assisted to bed, patient and husband oriented to room, pt in obvious excruitating pain.  Pt states pain is a 10/10 and is a headache.  Dr. Franky Macho paged and is in route to see patient.  Attempted to start PIV x2 without success.  IV team notified.  Arty Lantzy, [facility]

## 2012-12-18 NOTE — H&P (Signed)
Carolyn Barton is an 57 y.o. female.   Chief Complaint: headache, csf leak HPI: Recent surgery in February a right L4/5 laminectomy for synovial cyst resection. Did well until 3 weeks after surgery. Then developed headaches. 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 Decision made for surgical repair on the 25th of this month. Having unrelenting pain in her head and just could not deal with it at home.   Past Medical History  Diagnosis Date  . Essential thrombocytosis   . PONV (postoperative nausea and vomiting)     Past Surgical History  Procedure Laterality Date  . Cyst excision  11/11/2012    lumbar cyst remove    No family history on file. Social History:  reports that she has quit smoking. Her smoking use included Cigarettes. She has a .25 pack-year smoking history. She has never used smokeless tobacco. She reports that she does not drink alcohol or use illicit drugs.  Allergies: No Known Allergies  Medications Prior to Admission  Medication Sig Dispense Refill  . Butalbital-Acetaminophen 50-300 MG TABS Take 1 tablet by mouth every 6 (six) hours as needed (for headache).       Marland Kitchen HYDROcodone-acetaminophen (NORCO) 7.5-325 MG per tablet Take 1 tablet by mouth every 6 (six) hours as needed for pain.      Marland Kitchen ibuprofen (ADVIL,MOTRIN) 200 MG tablet Take 600 mg by mouth every 6 (six) hours as needed for pain.      . Naproxen Sodium (ALEVE PO) Take 2 tablets by mouth daily.        Results for orders placed during the hospital encounter of 12/17/12 (from the past 48 hour(s))  BASIC METABOLIC PANEL     Status: None   Collection Time    12/17/12  2:18 PM      Result Value Range   Sodium 136  135 - 145 mEq/L   Potassium 4.2  3.5 - 5.1 mEq/L   Chloride 100  96 - 112 mEq/L   CO2 26  19 - 32 mEq/L   Glucose, Bld 95  70 - 99 mg/dL   BUN 7  6 - 23 mg/dL   Creatinine, Ser 1.61  0.50 - 1.10 mg/dL   Calcium 9.8  8.4 - 09.6 mg/dL   GFR calc non Af Amer >90  >90 mL/min   GFR calc Af Amer >90   >90 mL/min   Comment:            The eGFR has been calculated     using the CKD EPI equation.     This calculation has not been     validated in all clinical     situations.     eGFR's persistently     <90 mL/min signify     possible Chronic Kidney Disease.  CBC     Status: Abnormal   Collection Time    12/17/12  2:18 PM      Result Value Range   WBC 9.9  4.0 - 10.5 K/uL   RBC 4.98  3.87 - 5.11 MIL/uL   Hemoglobin 14.9  12.0 - 15.0 g/dL   HCT 04.5  40.9 - 81.1 %   MCV 86.7  78.0 - 100.0 fL   MCH 29.9  26.0 - 34.0 pg   MCHC 34.5  30.0 - 36.0 g/dL   RDW 91.4  78.2 - 95.6 %   Platelets 589 (*) 150 - 400 K/uL  SURGICAL PCR SCREEN     Status: Abnormal   Collection Time  12/17/12  2:23 PM      Result Value Range   MRSA, PCR NEGATIVE  NEGATIVE   Staphylococcus aureus POSITIVE (*) NEGATIVE   Comment:            The Xpert SA Assay (FDA     approved for NASAL specimens     in patients over 72 years of age),     is one component of     a comprehensive surveillance     program.  Test performance has     been validated by [employer] for patients greater     than or equal to 2 year old.     It is not intended     to diagnose infection nor to     guide or monitor treatment.   Dg Chest 2 View  12/17/2012  *RADIOLOGY REPORT*  Clinical Data: Preoperative evaluation for spinal fluid leak repair.  Nonsmoker.  No cardiopulmonary disease  CHEST - 2 VIEW  Comparison: None.  Findings: Heart and mediastinal contours are within normal limits. The lung fields appear clear with no signs of focal infiltrate or congestive failure.  No pleural fluid or significant peribronchial cuffing is seen.  Bony structures appear intact.  IMPRESSION: No worrisome focal or acute cardiopulmonary abnormality identified.   Original Report Authenticated By: Rhodia Albright, M.D.     Review of Systems  Eyes: Negative.   Respiratory: Negative.   Cardiovascular: Negative.   Gastrointestinal: Negative.    Genitourinary: Negative.   Musculoskeletal: Positive for back pain.  Skin: Negative.   Neurological: Positive for weakness and headaches.  Endo/Heme/Allergies: Negative.   Psychiatric/Behavioral: Negative.     There were no vitals taken for this visit. Physical Exam  Constitutional: She is oriented to person, place, and time. She appears well-developed and well-nourished.  HENT:  Head: Normocephalic and atraumatic.  Eyes: Conjunctivae and EOM are normal. Pupils are equal, round, and reactive to light.  Neck: Normal range of motion. Neck supple.  Cardiovascular: Normal rate, regular rhythm, normal heart sounds and intact distal pulses.   Respiratory: Effort normal and breath sounds normal.  GI: Soft. Bowel sounds are normal.  Musculoskeletal: Normal range of motion.  Neurological: She is alert and oriented to person, place, and time. She has normal reflexes. No cranial nerve deficit. Coordination normal.  Skin: Skin is warm and dry.  Psychiatric: She has a normal mood and affect. Her behavior is normal. Judgment and thought content normal.     Assessment/Plan Admit for preop and pain control. Will also place on abx due to leak.  Neuro exam otherwise normal.  Dr. Venetia Maxon to see next week.    Carolyn Barton L 12/18/2012, 3:34 PM

## 2012-12-19 NOTE — Progress Notes (Signed)
Patient ID: Carolyn Barton, female   DOB: [birth year], 57 y.o.   MRN: [id number] BP 113/67  Pulse 100  Temp(Src) 97.2 F (36.2 C) (Oral)  Resp 14  Ht 5\' 6"  (1.676 m)  Wt 66.407 kg (146 lb 6.4 oz)  BMI 23.64 kg/m2  SpO2 100% Alert and oriented x 4 Speech is clear and fluent Moving all extremities well Wound is closed, no fluid on sheets or gown. Fluctuant Pain now controlled Await or

## 2012-12-20 MED ORDER — CEFAZOLIN SODIUM-DEXTROSE 2-3 GM-% IV SOLR: 2.0000 g | INTRAVENOUS | Status: DC

## 2012-12-20 NOTE — Progress Notes (Addendum)
Anesthesia Chart Review:  Patient is a 57 year old female scheduled for repair of CSF leak by Dr. Venetia Maxon on 12/21/12.  She underwent right L4-5 laminectomy for synovial cyst resection on 11/11/12.  Other history include essential thrombocytosis, post-operative N/V, former smoker.  She was seen by Hematologist Dr. Gaylyn Rong on 12/02/12 for follow-up of essential thrombocytosis, JAK2 mutation positive.  He felt there was no evidence of thrombotic complication and no indication for Hydrea at this time.  Continued observation was recommended. No PCP is listed.  [facility] reports her PCP is listed as Dr. Colin Broach (who is a GYN).  EKG on 12/17/12 showed NSR, T wave abnormality, consider anterior ischemia.  Currently, there are no comparison EKGs.  I was called for a preoperative EKG from [facility], but only a one lead was run on 11/11/12.    CXR on 12/17/12 showed no worrisome focal or acute cardiopulmonary abnormality.  Preoperative labs noted.  PLT count 557K, stable.   Patient has a CSF leak that needs to be repaired.  She has no known history of MI, CHF, HTN, DM.  She tolerated lumbar laminectomy last month.  I discussed above with Anesthesiologist Dr. Randa Evens.  Consider repeating her EKG on arrival and clinically correlate.  There is no documented chest pain history according to her health history or preoperative H&P. (Update: Noted patient has been admitted with headache and CSF leak.  Further evaluation and orders per the evaluating anesthesiologist.)  Shonna Chock, PA-C [facility]/Anesthesiology Phone [phone] 12/20/2012 1:27 PM

## 2012-12-20 NOTE — Progress Notes (Signed)
Utilization review completed. Brolin Dambrosia, RN, BSN. 

## 2012-12-20 NOTE — Progress Notes (Signed)
Subjective: Patient reports "I feel so much better than I did. I still have a headache, but its mild compared to Saturday."  Objective: Vital signs in last 24 hours: Temp:  [97.2 F (36.2 C)-99.1 F (37.3 C)] 97.9 F (36.6 C) (03/24 1406) Pulse Rate:  [72-101] 101 (03/24 1406) Resp:  [14-25] 22 (03/24 1553) BP: (91-133)/(57-79) 130/73 mmHg (03/24 1406) SpO2:  [94 %-98 %] 97 % (03/24 1553)  Intake/Output from previous day: 03/23 0701 - 03/24 0700 In: 1300 [P.O.:240; I.V.:960; IV Piggyback:100] Out: 3750 [Urine:3750] Intake/Output this shift: Total I/O In: -  Out: 2700 [Urine:2700]  Alert, conversant, in good spirits. MAEW. Only pain is "mild" headache. Incision without erythema. Some swelling present with HOB at 10degrees. Small amount clear drainage (?CSF) leaks from incision with logroll.  Foley patent. SCD's in use. Pt verbalizes understanding of plan for repair tomorrow.  Lab Results:  Recent Labs  12/18/12 1624  WBC 11.0*  HGB 13.2  HCT 38.1  PLT 557*   BMET  Recent Labs  12/18/12 1624  NA 130*  K 3.7  CL 94*  CO2 20  GLUCOSE 98  BUN 11  CREATININE 0.60  CALCIUM 9.4    Studies/Results: No results found.  Assessment/Plan:   LOS: 2 days  Permit for CSF leak repair. NPO after midnight.   Georgiann Cocker 12/20/2012, 5:47 PM

## 2012-12-21 ENCOUNTER — Inpatient Hospital Stay (HOSPITAL_COMMUNITY): Payer: BC Managed Care – PPO | Admitting: Anesthesiology

## 2012-12-21 ENCOUNTER — Inpatient Hospital Stay (HOSPITAL_COMMUNITY): Admission: RE | Admit: 2012-12-21 | Payer: BC Managed Care – PPO | Source: Ambulatory Visit | Admitting: Neurosurgery

## 2012-12-21 ENCOUNTER — Encounter (HOSPITAL_COMMUNITY)
Admission: AD | Disposition: A | Discharge status: Home / Self Care | Payer: Self-pay | Source: Ambulatory Visit | Attending: Neurosurgery

## 2012-12-21 ENCOUNTER — Encounter (HOSPITAL_COMMUNITY): Payer: Self-pay | Admitting: Anesthesiology

## 2012-12-21 ENCOUNTER — Encounter (HOSPITAL_COMMUNITY): Payer: Self-pay | Admitting: Vascular Surgery

## 2012-12-21 HISTORY — PX: OTHER SURGICAL HISTORY: SHX169

## 2012-12-21 SURGERY — REPAIR OF CEREBROSPINAL FLUID LEAK
Anesthesia: General | Site: Spine Lumbar | Wound class: Clean

## 2012-12-21 MED ORDER — HYDROCODONE-ACETAMINOPHEN 5-325 MG PO TABS: 1.0000 | ORAL_TABLET | ORAL | Status: DC | PRN

## 2012-12-21 MED ORDER — ONDANSETRON HCL 4 MG/2ML IJ SOLN
INTRAMUSCULAR | Status: DC | PRN
  Administered 2012-12-21: 4 mg via INTRAVENOUS

## 2012-12-21 MED ORDER — ONDANSETRON HCL 4 MG/2ML IJ SOLN: 4.0000 mg | INTRAMUSCULAR | Status: DC | PRN

## 2012-12-21 MED ORDER — DEXTROSE 5 % IV SOLN
INTRAVENOUS | Status: DC | PRN
  Administered 2012-12-21: 14:00:00 via INTRAVENOUS

## 2012-12-21 MED ORDER — ARTIFICIAL TEARS OP OINT
TOPICAL_OINTMENT | OPHTHALMIC | Status: DC | PRN
  Administered 2012-12-21: 1 via OPHTHALMIC

## 2012-12-21 MED ORDER — BACITRACIN ZINC 500 UNIT/GM EX OINT
TOPICAL_OINTMENT | CUTANEOUS | Status: DC | PRN
  Administered 2012-12-21: 1 via TOPICAL

## 2012-12-21 MED ORDER — EPHEDRINE SULFATE 50 MG/ML IJ SOLN
INTRAMUSCULAR | Status: DC | PRN
  Administered 2012-12-21: 10 mg via INTRAVENOUS

## 2012-12-21 MED ORDER — MIDAZOLAM HCL 5 MG/5ML IJ SOLN
INTRAMUSCULAR | Status: DC | PRN
  Administered 2012-12-21: 2 mg via INTRAVENOUS

## 2012-12-21 MED ORDER — 0.9 % SODIUM CHLORIDE (POUR BTL) OPTIME
TOPICAL | Status: DC | PRN
  Administered 2012-12-21: 1000 mL

## 2012-12-21 MED ORDER — CEFAZOLIN SODIUM 1-5 GM-% IV SOLN: 1.0000 g | Freq: Three times a day (TID) | INTRAVENOUS | Status: DC

## 2012-12-21 MED ORDER — PANTOPRAZOLE SODIUM 40 MG IV SOLR
40.0000 mg | Freq: Every day | INTRAVENOUS | Status: DC
  Administered 2012-12-21: 40 mg via INTRAVENOUS
  Filled 2012-12-21 (×2): qty 40

## 2012-12-21 MED ORDER — ONDANSETRON HCL 4 MG/2ML IJ SOLN: 4.0000 mg | Freq: Four times a day (QID) | INTRAMUSCULAR | Status: DC | PRN

## 2012-12-21 MED ORDER — THROMBIN 5000 UNITS EX SOLR
CUTANEOUS | Status: DC | PRN
  Administered 2012-12-21 (×2): 5000 [IU] via TOPICAL

## 2012-12-21 MED ORDER — POLYETHYLENE GLYCOL 3350 17 G PO PACK
17.0000 g | PACK | Freq: Every day | ORAL | Status: DC | PRN
  Filled 2012-12-21: qty 1

## 2012-12-21 MED ORDER — DEXAMETHASONE SODIUM PHOSPHATE 4 MG/ML IJ SOLN
INTRAMUSCULAR | Status: DC | PRN
  Administered 2012-12-21: 8 mg via INTRAVENOUS

## 2012-12-21 MED ORDER — FENTANYL CITRATE 0.05 MG/ML IJ SOLN
INTRAMUSCULAR | Status: DC | PRN
  Administered 2012-12-21 (×2): 50 ug via INTRAVENOUS
  Administered 2012-12-21: 100 ug via INTRAVENOUS

## 2012-12-21 MED ORDER — NEOSTIGMINE METHYLSULFATE 1 MG/ML IJ SOLN
INTRAMUSCULAR | Status: DC | PRN
  Administered 2012-12-21: 3 mg via INTRAVENOUS

## 2012-12-21 MED ORDER — ROCURONIUM BROMIDE 100 MG/10ML IV SOLN
INTRAVENOUS | Status: DC | PRN
  Administered 2012-12-21: 50 mg via INTRAVENOUS

## 2012-12-21 MED ORDER — FLEET ENEMA 7-19 GM/118ML RE ENEM: 1.0000 | ENEMA | Freq: Once | RECTAL | Status: AC | PRN

## 2012-12-21 MED ORDER — LACTATED RINGERS IV SOLN
INTRAVENOUS | Status: DC | PRN
  Administered 2012-12-21: 13:00:00 via INTRAVENOUS

## 2012-12-21 MED ORDER — ACETAMINOPHEN 325 MG PO TABS: 650.0000 mg | ORAL_TABLET | ORAL | Status: DC | PRN

## 2012-12-21 MED ORDER — PHENYLEPHRINE HCL 10 MG/ML IJ SOLN
INTRAMUSCULAR | Status: DC | PRN
  Administered 2012-12-21 (×3): 80 ug via INTRAVENOUS

## 2012-12-21 MED ORDER — OXYCODONE HCL 5 MG/5ML PO SOLN: 5.0000 mg | Freq: Once | ORAL | Status: DC | PRN

## 2012-12-21 MED ORDER — GLYCOPYRROLATE 0.2 MG/ML IJ SOLN
INTRAMUSCULAR | Status: DC | PRN
  Administered 2012-12-21: 0.4 mg via INTRAVENOUS

## 2012-12-21 MED ORDER — HYDROMORPHONE HCL PF 1 MG/ML IJ SOLN
0.2500 mg | INTRAMUSCULAR | Status: DC | PRN
  Administered 2012-12-21 (×4): 0.5 mg via INTRAVENOUS

## 2012-12-21 MED ORDER — SENNA 8.6 MG PO TABS
1.0000 | ORAL_TABLET | Freq: Two times a day (BID) | ORAL | Status: DC
  Filled 2012-12-21 (×4): qty 1

## 2012-12-21 MED ORDER — MORPHINE SULFATE 2 MG/ML IJ SOLN
1.0000 mg | INTRAMUSCULAR | Status: DC | PRN
  Administered 2012-12-21 (×2): 2 mg via INTRAVENOUS
  Filled 2012-12-21 (×2): qty 1

## 2012-12-21 MED ORDER — OXYCODONE HCL 5 MG PO TABS: 5.0000 mg | ORAL_TABLET | Freq: Once | ORAL | Status: DC | PRN

## 2012-12-21 MED ORDER — MENTHOL 3 MG MT LOZG: 1.0000 | LOZENGE | OROMUCOSAL | Status: DC | PRN

## 2012-12-21 MED ORDER — BISACODYL 10 MG RE SUPP
10.0000 mg | Freq: Every day | RECTAL | Status: DC | PRN
  Filled 2012-12-21: qty 1

## 2012-12-21 MED ORDER — PROPOFOL 10 MG/ML IV BOLUS
INTRAVENOUS | Status: DC | PRN
  Administered 2012-12-21: 170 mg via INTRAVENOUS

## 2012-12-21 MED ORDER — SODIUM CHLORIDE 0.9 % IV SOLN: 250.0000 mL | INTRAVENOUS | Status: DC

## 2012-12-21 MED ORDER — KCL IN DEXTROSE-NACL 20-5-0.45 MEQ/L-%-% IV SOLN
INTRAVENOUS | Status: DC
  Administered 2012-12-21 – 2012-12-22 (×2): via INTRAVENOUS
  Filled 2012-12-21 (×5): qty 1000

## 2012-12-21 MED ORDER — SODIUM CHLORIDE 0.9 % IJ SOLN
3.0000 mL | Freq: Two times a day (BID) | INTRAMUSCULAR | Status: DC
  Administered 2012-12-22 (×2): 3 mL via INTRAVENOUS

## 2012-12-21 MED ORDER — VANCOMYCIN HCL 1000 MG IV SOLR
1000.0000 mg | INTRAVENOUS | Status: DC | PRN
  Administered 2012-12-21: 1000 mg via INTRAVENOUS

## 2012-12-21 MED ORDER — DOCUSATE SODIUM 100 MG PO CAPS
100.0000 mg | ORAL_CAPSULE | Freq: Two times a day (BID) | ORAL | Status: DC
  Administered 2012-12-21 – 2012-12-23 (×3): 100 mg via ORAL
  Filled 2012-12-21 (×2): qty 1

## 2012-12-21 MED ORDER — HEMOSTATIC AGENTS (NO CHARGE) OPTIME
TOPICAL | Status: DC | PRN
  Administered 2012-12-21: 1 via TOPICAL

## 2012-12-21 MED ORDER — ACETAMINOPHEN 650 MG RE SUPP: 650.0000 mg | RECTAL | Status: DC | PRN

## 2012-12-21 MED ORDER — LIDOCAINE HCL 4 % MT SOLN
OROMUCOSAL | Status: DC | PRN
  Administered 2012-12-21: 4 mL via TOPICAL

## 2012-12-21 MED ORDER — PHENOL 1.4 % MT LIQD: 1.0000 | OROMUCOSAL | Status: DC | PRN

## 2012-12-21 MED ORDER — LIDOCAINE HCL (CARDIAC) 20 MG/ML IV SOLN
INTRAVENOUS | Status: DC | PRN
  Administered 2012-12-21: 80 mg via INTRAVENOUS

## 2012-12-21 MED ORDER — SODIUM CHLORIDE 0.9 % IJ SOLN: 3.0000 mL | INTRAMUSCULAR | Status: DC | PRN

## 2012-12-21 MED ORDER — OXYCODONE-ACETAMINOPHEN 5-325 MG PO TABS
1.0000 | ORAL_TABLET | ORAL | Status: DC | PRN
  Administered 2012-12-23: 2 via ORAL
  Filled 2012-12-21: qty 2

## 2012-12-21 SURGICAL SUPPLY — 57 items
APL SKNCLS STERI-STRIP NONHPOA (GAUZE/BANDAGES/DRESSINGS)
BAG DECANTER FOR FLEXI CONT (MISCELLANEOUS) ×1 IMPLANT
BENZOIN TINCTURE PRP APPL 2/3 (GAUZE/BANDAGES/DRESSINGS) IMPLANT
CANISTER SUCTION 2500CC (MISCELLANEOUS) ×2 IMPLANT
CLOTH BEACON ORANGE TIMEOUT ST (SAFETY) ×2 IMPLANT
CONT SPEC 4OZ CLIKSEAL STRL BL (MISCELLANEOUS) ×1 IMPLANT
DRAPE LAPAROTOMY 100X72X124 (DRAPES) ×2 IMPLANT
DRAPE MICROSCOPE ZEISS OPMI (DRAPES) ×1 IMPLANT
DRAPE POUCH INSTRU U-SHP 10X18 (DRAPES) ×2 IMPLANT
DRAPE SURG 17X23 STRL (DRAPES) ×2 IMPLANT
DRESSING TELFA 8X3 (GAUZE/BANDAGES/DRESSINGS) ×1 IMPLANT
DURAFORM COLLAGEN 1X1 5-PACK (Neuro Prosthesis/Implant) ×1 IMPLANT
DURAPREP 26ML APPLICATOR (WOUND CARE) ×2 IMPLANT
DURASEAL SPINE SEALANT 3ML (MISCELLANEOUS) ×1 IMPLANT
ELECT REM PT RETURN 9FT ADLT (ELECTROSURGICAL) ×2
ELECTRODE REM PT RTRN 9FT ADLT (ELECTROSURGICAL) ×1 IMPLANT
GAUZE SPONGE 4X4 16PLY XRAY LF (GAUZE/BANDAGES/DRESSINGS) IMPLANT
GLOVE BIO SURGEON STRL SZ8 (GLOVE) ×2 IMPLANT
GLOVE BIOGEL PI IND STRL 7.0 (GLOVE) IMPLANT
GLOVE BIOGEL PI IND STRL 8 (GLOVE) ×1 IMPLANT
GLOVE BIOGEL PI IND STRL 8.5 (GLOVE) ×1 IMPLANT
GLOVE BIOGEL PI INDICATOR 7.0 (GLOVE) ×3
GLOVE BIOGEL PI INDICATOR 8 (GLOVE) ×1
GLOVE BIOGEL PI INDICATOR 8.5 (GLOVE) ×2
GLOVE ECLIPSE 8.0 STRL XLNG CF (GLOVE) ×2 IMPLANT
GLOVE ECLIPSE 8.5 STRL (GLOVE) ×1 IMPLANT
GLOVE EXAM NITRILE LRG STRL (GLOVE) IMPLANT
GLOVE EXAM NITRILE MD LF STRL (GLOVE) IMPLANT
GLOVE EXAM NITRILE XL STR (GLOVE) IMPLANT
GLOVE EXAM NITRILE XS STR PU (GLOVE) IMPLANT
GLOVE SURG SS PI 6.5 STRL IVOR (GLOVE) ×2 IMPLANT
GOWN BRE IMP SLV AUR LG STRL (GOWN DISPOSABLE) ×1 IMPLANT
GOWN BRE IMP SLV AUR XL STRL (GOWN DISPOSABLE) ×2 IMPLANT
GOWN STRL REIN 2XL LVL4 (GOWN DISPOSABLE) ×1 IMPLANT
KIT BASIN OR (CUSTOM PROCEDURE TRAY) ×2 IMPLANT
KIT ROOM TURNOVER OR (KITS) ×2 IMPLANT
LIDOCAINE 1% W/EPI 1:100,000 IMPLANT
NDL HYPO 25X5/8 SAFETYGLIDE (NEEDLE) ×1 IMPLANT
NEEDLE HYPO 25X5/8 SAFETYGLIDE (NEEDLE) ×2 IMPLANT
NS IRRIG 1000ML POUR BTL (IV SOLUTION) ×2 IMPLANT
PACK LAMINECTOMY NEURO (CUSTOM PROCEDURE TRAY) ×2 IMPLANT
PAD ARMBOARD 7.5X6 YLW CONV (MISCELLANEOUS) ×6 IMPLANT
SENSORCAINE 0.5% IMPLANT
SPONGE GAUZE 4X4 12PLY (GAUZE/BANDAGES/DRESSINGS) ×1 IMPLANT
SPONGE SURGIFOAM ABS GEL SZ50 (HEMOSTASIS) ×1 IMPLANT
STAPLER SKIN PROX WIDE 3.9 (STAPLE) IMPLANT
STRIP CLOSURE SKIN 1/2X4 (GAUZE/BANDAGES/DRESSINGS) IMPLANT
SUT ETHILON 3 0 FSL (SUTURE) ×1 IMPLANT
SUT PROLENE 6 0 BV (SUTURE) ×3 IMPLANT
SUT VIC AB 0 CT1 18XCR BRD8 (SUTURE) ×1 IMPLANT
SUT VIC AB 0 CT1 8-18 (SUTURE) ×2
SUT VIC AB 2-0 CT1 18 (SUTURE) ×2 IMPLANT
SUT VIC AB 3-0 SH 8-18 (SUTURE) ×2 IMPLANT
TAPE CLOTH SURG 4X10 WHT LF (GAUZE/BANDAGES/DRESSINGS) ×1 IMPLANT
TOWEL OR 17X24 6PK STRL BLUE (TOWEL DISPOSABLE) ×2 IMPLANT
TOWEL OR 17X26 10 PK STRL BLUE (TOWEL DISPOSABLE) ×2 IMPLANT
WATER STERILE IRR 1000ML POUR (IV SOLUTION) ×2 IMPLANT

## 2012-12-21 NOTE — Progress Notes (Signed)
To OR for repair of CSF leak later today.

## 2012-12-21 NOTE — Brief Op Note (Signed)
12/18/2012 - 12/21/2012  3:01 PM  PATIENT:  Carolyn Barton  57 y.o. 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  PROCEDURE:  Procedure(s) with comments: REPAIR OF CEREBROSPINAL FLUID LEAK (N/A) - Repair of Cerebrospinal fluid leak  SURGEON:  Surgeon(s) and Role:    * Maeola Harman, MD - Primary    * Temple Pacini, MD - Assisting  PHYSICIAN ASSISTANT:   ASSISTANTS: Poteat, RN   ANESTHESIA:   general  EBL:  Total I/O In: 800 [I.V.:800] Out: 720 [Urine:700; Blood:20]  BLOOD ADMINISTERED:none  DRAINS: none   LOCAL MEDICATIONS USED:  MARCAINE     SPECIMEN:  No Specimen  DISPOSITION OF SPECIMEN:  N/A  COUNTS:  YES  TOURNIQUET:  * No tourniquets in log *  DICTATION: DICTATION: Patient is a 57 year old woman with headache and leakage from her back after uncomplicated lumbar laminectomy and resection of synovial cyst. She was admitted throught the ER with leakage of fluid from her back  Procedure: Patient was brought to the operating room and underwent uncomplicated induction of anesthesia and was then placed in a prone position on the [employer].  Her back was prepped and draped in the usual fashion with betadine scrub and duraprep.  Prior incision was reopened.  This demonstrated CSF.  A subcutaneous pocket was tracked to the laminectomy defect.  Using the microscope, a dural defect was identified along the dorsal laminectomy defect. I was able to gain access to this site without removing additional bone.  After doing so, I was able to define the dural defect.  I then repaired the dural defect with a running 6-0 prolene suture.  After a Valsalva maneuver showed a water-tight closure, the suture line was covered with Duragen and dural sealant.  The wound was closed with 0, 2-0 vicryl and 3-0 Nylonl sutures and a sterile occlusive dressing.  Counts were correct at the end of the case.  The patient was extubated and taken to Recovery in stable and satisfactory condition having tolerated her surgery well.  PLAN OF CARE: Admit to inpatient   PATIENT DISPOSITION:  PACU - hemodynamically stable.   Delay start of Pharmacological VTE agent (>24hrs) due to surgical blood loss or risk of bleeding: yes

## 2012-12-21 NOTE — Progress Notes (Signed)
Carolyn Barton  #[id number] DOB:  [birth year] 12/08/2012:  Carolyn Barton returns today to review her MRI. She is having worsening headaches and has occasional severe right leg pain.  Since the MRI is consistent with a CSF leak and I believe this is the basis for her pain, she is also getting an enlarging fluid collection on her back.  She does need to have exploration of this CSF leak.  Unfortunately I am leaving town at the end of the week. She says she did not want to have surgery and have me leave town. She would rather have me be around. Consequently, we are going to go ahead and schedule for exploration for CSF leak on 12/21/2012.  I explained to her that the only concern that I would have that would necessitate surgery sooner is if she actually had leakage from her skin, but that her incision appears to be healing well and clearly this is improved. I gave her Fioricet for headache. She knows to lie flat and that this will help, should that increase in frequency, I will plan on going ahead with surgery on the 25th of March. Risks and benefits were discussed. She knows she may have to lie flat for up to three days after surgery.          Danae Orleans. Venetia Maxon, M.D./aft  NEUROSURGICAL CONSULTATION   Carolyn Barton "Carolyn" C. Barton  DOB:  [birth year] #[id number]    November 03, 2012   HISTORY:     Carolyn Barton is a 57 year old woman with right leg pain.  She complains of pain into her right thigh, occasionally into her right lower leg and is frustrated with her inability to play tennis as a result of this pain.  She says this began in August of 2013.  She has been taking Hydrocodone 7.5/325 twice daily without relief.  She has a "high platelet count" which is followed at [facility] every four months and no treatment has been required.  She is otherwise healthy.    REVIEW OF SYSTEMS:   A detailed Review of Systems sheet was reviewed with the patient.  Pertinent positives include musculoskeletal - she notes leg  pain, otherwise unremarkable.  All other systems are negative; this includes Constitutional symptoms, Eyes, Cardiovascular, Ears, nose, mouth, throat, Endocrine, Respiratory, Gastrointestinal, Genitourinary, Integumentary & Breast, Neurologic, Psychiatric, Hematologic/Lymphatic, Allergic/Immunologic.    PAST MEDICAL HISTORY:      Current Medical Conditions:    As previously described above.     Medications and Allergies:  Current medications include Hydrocodone  and she takes no other medications.      Height and Weight:     She is currently 5'6" tall, 138 lbs.   FAMILY HISTORY:    Her mother is deceased.  Her father is 73 in good health.    SOCIAL HISTORY:    She denies smoking, drinking alcohol, or any history of substance abuse.    DIAGNOSTIC STUDIES:   She has an MRI which was obtained through [facility] which shows moderate to severe facet arthropathy at L4-5 with a 1.2 x .8 x 1.3 cm. right facet synovial cyst causing severe right lateral recess stenosis and impinging upon and displacing the descending right L5, S1, and S2 nerve roots. She has a mild right and minimal left foraminal stenosis.  The 1.3 cm. right facet synovial cyst extends into the paraspinal soft tissues containing debris and synovitis.  There is a 9 mm. left-sided facet cyst.  There is additionally at  L3-4 a left facet synovial cyst of 7 mm. in size and there is a moderate to severe foraminal stenosis at L5-S1.     PHYSICAL EXAMINATION:      General Appearance:   On examination today, Carolyn Barton is a pleasant and cooperative woman in no acute distress.     Blood Pressure, Pulse:     Her blood pressure is 118/74.  Heart rate is 64 and regular.  Respiratory rate is 18.      HEENT - normocephalic, atraumatic.  The pupils are equal, round and reactive to light.  The extraocular muscles are intact.  Sclerae - white.  Conjunctiva - pink.  Oropharynx benign.  Uvula midline.     Neck - there are no masses,  meningismus, deformities, tracheal deviation, jugular vein distention or carotid bruits.  There is normal cervical range of motion.  Spurlings' test is negative without reproducible radicular pain turning the patient's head to either side.  Lhermitte's sign is not present with axial compression.      Respiratory - there is normal respiratory effort with good intercostal function.  Lungs are clear to auscultation.  There are no rales, rhonchi or wheezes.      Cardiovascular - the heart has regular rate and rhythm to auscultation.  No murmurs are appreciated.  There is no extremity edema, cyanosis or clubbing.  There are palpable pedal pulses.      Abdomen - soft, nontender, no hepatosplenomegaly appreciated or masses.  There are active bowel sounds.  No guarding or rebound.      Musculoskeletal Examination - she is able to bend to touch her toes.  She is able to stand on her heels and toes.  She has a positive straight leg raise on the right and negative on the left.  She has decreased ability to squat on her right leg compared to the left.    NEUROLOGICAL EXAMINATION: The patient is oriented to time, person and place and has good recall of both recent and remote memory with normal attention span and concentration.  The patient speaks with clear and fluent speech and exhibits normal language function and appropriate fund of knowledge.    Cranial Nerve Examination - pupils are equal, round and reactive to light.  Extraocular movements are full.  Visual fields are full to confrontational testing.  Facial sensation and facial movement are symmetric and intact.  Hearing is intact to finger rub.  Palate is upgoing.  Shoulder shrug is symmetric.  Tongue protrudes in the midline.      Motor Examination - motor strength is 5/5 in the bilateral deltoids, biceps, triceps, handgrips, wrist extensors, interosseous.  In the lower extremities motor strength is 5/5 in hip flexion, extension, quadriceps, hamstrings,  plantar flexion, dorsiflexion, 4-/5 right hip abductor strength, 4-/5 right extensor hallucis longus strength, and 5/5 left extensor hallucis longus.      Sensory Examination - she notes decreased pin sensation in a right L5 distribution.      Deep Tendon Reflexes - 2 in the biceps, triceps, and brachioradialis, 2 in the knees, 2 in the ankles.  The great toes are downgoing to plantar stimulation.      Cerebellar Examination - normal coordination in upper and lower extremities and normal rapid alternating movements.  Romberg test is negative.    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  I have recommended that she undergo a right L4-5 laminectomy and resection of synovial cyst.  We went over attendant risks and benefits of surgery. She wishes to proceed.  This has been scheduled for 11/11/2012.    I reviewed the studies with the patient and went over her physical examination.  I reviewed surgical models and discussed the typical hospital course and operative and postoperative course and the potential risks and benefits of surgery.  The risks of surgery were discussed in detail and include, but are not limited to, the risks of anesthesia, blood loss and the possibility of hemorrhage, infection, damage to nerves, damage to blood vessels, injury to the lumbar nerve root causing either temporary or permanent leg pain, numbness, weakness.  There is potential for spinal fluid leak from dural tear.  There is the potential for post-laminectomy spondylolisthesis, recurrent disc ruptured quoted at approximately 10%, failure to relieve pain, worsening of pain, need for further surgery.    [facility]    Danae Orleans. Venetia Maxon, M.D.

## 2012-12-21 NOTE — Progress Notes (Signed)
Subjective: Patient reports "I feel ok"  Objective: Vital signs in last 24 hours: Temp:  [97.9 F (36.6 C)-98.7 F (37.1 C)] 98 F (36.7 C) (03/25 1000) Pulse Rate:  [72-101] 81 (03/25 1000) Resp:  [15-25] 18 (03/25 1000) BP: (102-130)/(64-79) 116/69 mmHg (03/25 1000) SpO2:  [96 %-98 %] 96 % (03/25 1000)  Intake/Output from previous day: 03/24 0701 - 03/25 0700 In: 2059.4 [P.O.:240; I.V.:1819.4] Out: 7075 [Urine:7075] Intake/Output this shift:    Alert, conversant. Husband and daughter at bedside. No change in h/a. Ready to proceed with repair of csf leak.  Lab Results:  Recent Labs  12/18/12 1624  WBC 11.0*  HGB 13.2  HCT 38.1  PLT 557*   BMET  Recent Labs  12/18/12 1624  NA 130*  K 3.7  CL 94*  CO2 20  GLUCOSE 98  BUN 11  CREATININE 0.60  CALCIUM 9.4    Studies/Results: No results found.  Assessment/Plan:   LOS: 3 days  Surgical repair of csf leak today.   Georgiann Cocker 12/21/2012, 11:49 AM

## 2012-12-21 NOTE — Transfer of Care (Signed)
Immediate Anesthesia Transfer of Care Note  Patient: Carolyn Barton  Procedure(s) Performed: Procedure(s) with comments: REPAIR OF CEREBROSPINAL FLUID LEAK (N/A) - Repair of Cerebrospinal fluid leak  Patient Location: PACU  Anesthesia Type:General  Level of Consciousness: awake, alert  and oriented  Airway & Oxygen Therapy: Patient Spontanous Breathing and Patient connected to face mask oxygen  Post-op Assessment: Report given to PACU RN  Post vital signs: Reviewed and stable  Complications: No apparent anesthesia complications

## 2012-12-21 NOTE — Op Note (Signed)
12/18/2012 - 12/21/2012  3:01 PM  PATIENT:  Carolyn Barton  57 y.o. 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  PROCEDURE:  Procedure(s) with comments: REPAIR OF CEREBROSPINAL FLUID LEAK (N/A) - Repair of Cerebrospinal fluid leak  SURGEON:  Surgeon(s) and Role:    * Makynzi Eastland, MD - Primary    * Henry A Pool, MD - Assisting  PHYSICIAN ASSISTANT:   ASSISTANTS: Poteat, RN   ANESTHESIA:   general  EBL:  Total I/O In: 800 [I.V.:800] Out: 720 [Urine:700; Blood:20]  BLOOD ADMINISTERED:none  DRAINS: none   LOCAL MEDICATIONS USED:  MARCAINE     SPECIMEN:  No Specimen  DISPOSITION OF SPECIMEN:  N/A  COUNTS:  YES  TOURNIQUET:  * No tourniquets in log *  DICTATION: DICTATION: Patient is a 57 year old woman with headache and leakage from her back after uncomplicated lumbar laminectomy and resection of synovial cyst. She was admitted throught the ER with leakage of fluid from her back  Procedure: Patient was brought to the operating room and underwent uncomplicated induction of anesthesia and was then placed in a prone position on the Wilson Frame.  Her back was prepped and draped in the usual fashion with betadine scrub and duraprep.  Prior incision was reopened.  This demonstrated CSF.  A subcutaneous pocket was tracked to the laminectomy defect.  Using the microscope, a dural defect was identified along the dorsal laminectomy defect. I was able to gain access to this site without removing additional bone.  After doing so, I was able to define the dural defect.  I then repaired the dural defect with a running 6-0 prolene suture.  After a Valsalva maneuver showed a water-tight closure, the suture line was covered with Duragen and dural sealant.  The wound was closed with 0, 2-0 vicryl and 3-0 Nylonl sutures and a sterile occlusive dressing.  Counts were correct at the end of the case.  The patient was extubated and taken to Recovery in stable and satisfactory condition having tolerated her surgery well.  PLAN OF CARE: Admit to inpatient   PATIENT DISPOSITION:  PACU - hemodynamically stable.   Delay start of Pharmacological VTE agent (>24hrs) due to surgical blood loss or risk of bleeding: yes  

## 2012-12-21 NOTE — Anesthesia Preprocedure Evaluation (Signed)
Anesthesia Evaluation  Patient identified by MRN, date of birth, ID band Patient awake    Reviewed: Allergy & Precautions, H&P , NPO status , Patient's Chart, lab work & pertinent test results  History of Anesthesia Complications (+) PONV  Airway Mallampati: II  Neck ROM: full    Dental   Pulmonary former smoker,          Cardiovascular     Neuro/Psych    GI/Hepatic   Endo/Other    Renal/GU      Musculoskeletal   Abdominal   Peds  Hematology   Anesthesia Other Findings   Reproductive/Obstetrics                           Anesthesia Physical Anesthesia Plan  ASA: II  Anesthesia Plan: General   Post-op Pain Management:    Induction: Intravenous  Airway Management Planned: Oral ETT  Additional Equipment:   Intra-op Plan:   Post-operative Plan: Extubation in OR  Informed Consent: I have reviewed the patients History and Physical, chart, labs and discussed the procedure including the risks, benefits and alternatives for the proposed anesthesia with the patient or authorized representative who has indicated his/her understanding and acceptance.     Plan Discussed with: CRNA and Surgeon  Anesthesia Plan Comments:         Anesthesia Quick Evaluation

## 2012-12-21 NOTE — Progress Notes (Signed)
Awake, alert, conversant.  No headache or leg pain.  MAEW.  Doing well.

## 2012-12-21 NOTE — Progress Notes (Signed)
Ready for surgery in am.

## 2012-12-21 NOTE — Preoperative (Signed)
Beta Blockers   Reason not to administer Beta Blockers:Not Applicable 

## 2012-12-22 MED ORDER — PANTOPRAZOLE SODIUM 40 MG PO TBEC
40.0000 mg | DELAYED_RELEASE_TABLET | Freq: Every day | ORAL | Status: DC
  Administered 2012-12-22: 40 mg via ORAL
  Filled 2012-12-22 (×2): qty 1

## 2012-12-22 NOTE — Progress Notes (Signed)
PT Cancellation Note  Patient Details Name: CURTIS URIARTE MRN: [id number] DOB: 1955/11/09   Cancelled Treatment:    Reason Eval/Treat Not Completed: Patient not medically ready. Pt with CSF leak and to remain flat in bed today. Plan for OOB tomorrow per Dr. Lovell Sheehan note. PT to re-assess when able.   Marcene Brawn 12/22/2012, 11:24 AM

## 2012-12-22 NOTE — Progress Notes (Signed)
Subjective: Patient reports doing well  Objective: Vital signs in last 24 hours: Temp:  [97 F (36.1 C)-98.5 F (36.9 C)] 98.2 F (36.8 C) (03/26 0605) Pulse Rate:  [61-90] 67 (03/26 0605) Resp:  [11-20] 16 (03/26 0605) BP: (104-137)/(55-81) 131/77 mmHg (03/26 0605) SpO2:  [92 %-100 %] 99 % (03/26 0605)  Intake/Output from previous day: 03/25 0701 - 03/26 0700 In: 800 [I.V.:800] Out: 1120 [Urine:1100; Blood:20] Intake/Output this shift:    Physical Exam: Back flat.  No leg complaints, dressing CDI  Lab Results: No results found for this basename: WBC, HGB, HCT, PLT,  in the last 72 hours BMET No results found for this basename: NA, K, CL, CO2, GLUCOSE, BUN, CREATININE, CALCIUM,  in the last 72 hours  Studies/Results: No results found.  Assessment/Plan: Flat until tomorrow, then OOB.    LOS: 4 days    Dorian Heckle, MD 12/22/2012, 6:20 AM

## 2012-12-22 NOTE — Clinical Social Work Note (Signed)
Clinical Social Work   CSW received consult for SNF. CSW reviewed pt's chart and discussed pt with RNCM. Awaiting PT/OT evals for discharge recommendations. Evals are needed for prior auth for SNF placement. CSW will assess for SNF, if appropriate. CSW will continue to follow.   Dede Query, MSW, LCSW [phone]

## 2012-12-22 NOTE — Progress Notes (Signed)
OT Cancellation Note  Patient Details Name: Carolyn Barton MRN: [id number] DOB: 1956/03/05   Cancelled Treatment:    Reason Eval/Treat Not Completed: Patient not medically ready. Per MD this morning, pt to remain flat in bed today, OOB tomorrow  Galen Manila 12/22/2012, 9:51 AM

## 2012-12-22 NOTE — Consult Note (Cosign Needed)
Carolyn Mcsweeney EdD 

## 2012-12-22 NOTE — Anesthesia Postprocedure Evaluation (Signed)
Anesthesia Post Note  Patient: Carolyn Barton  Procedure(s) Performed: Procedure(s) (LRB): REPAIR OF CEREBROSPINAL FLUID LEAK (N/A)  Anesthesia type: general  Patient location: PACU  Post pain: Pain level controlled  Post assessment: Patient's Cardiovascular Status Stable  Last Vitals:  Filed Vitals:   12/22/12 0605  BP: 131/77  Pulse: 67  Temp: 36.8 C  Resp: 16    Post vital signs: Reviewed and stable  Level of consciousness: sedated  Complications: No apparent anesthesia complications

## 2012-12-23 NOTE — Progress Notes (Signed)
PT Cancellation Note  Patient Details Name: Carolyn Barton MRN: [id number] DOB: [birth year]   Cancelled Treatment:    Reason Eval/Treat Not Completed: Other (comment) (Pt. DC'd home prior to PT eval)   Ferman Hamming 12/23/2012, 2:01 PM Weldon Picking PT [facility] [phone] Beeper [phone]

## 2012-12-23 NOTE — Consult Note (Cosign Needed)
Sonja Wilson, EdD 

## 2012-12-23 NOTE — Clinical Social Work Note (Signed)
Clinical Social Work   Pt discharged to home prior to assessment. Per discharge summary, SNF was not needed. CSW is signing off at this time.   Dede Query, MSW, LCSW [phone]

## 2012-12-23 NOTE — Discharge Summary (Signed)
Physician Discharge Summary  Patient ID: EGYPT WELCOME MRN: [id number] DOB/AGE: 56/02/1956 56 y.o.  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 *   Discharged Condition: good  Hospital Course: Carolyn Barton was readmitted on 12-18-12 for severe headache.  Pain was controlled with Dilaudid PCA and surgery for repair of lumbar CSF leak proceeded as scheduled on 12-21-12.  Following uncomplicated surgery, she remained flat for 36hrs, mobilizing 12-23-12.  Consults: None  Significant Diagnostic Studies:   Treatments: surgery: Repair of Cerebrospinal fluid leak    Discharge Exam: Blood pressure 103/60, pulse 65, temperature 98.1 F (36.7 C), temperature source Oral, resp. rate 16, height 5\' 6"  (1.676 m), weight 66.407 kg (146 lb 6.4 oz), SpO2 98.00%. Alert, conversant. No report of pain. Denies h/a. MAEW. Good strength all extremities. Incision without erythema, drainage, or swelling. Site slightly sunken, as expected. Suture intact.    Disposition: D/C to home. Pt verbalizes understanding of d/c instructions & will call office for 2week appt for suture removal. Rx's: Hydrocodone 5/325 1-2 po q6hrs prn pain #50, Valium 5mg  1po q8hrs prn spasm #50      Future Appointments Provider Department Dept Phone   06/03/2013 10:00 AM [facility] [phone]   12/01/2013 10:45 AM Krista Blue [facility] [phone]   12/01/2013 11:15 AM Myrtis Ser, NP [facility] [phone]       Medication List    ASK your doctor about these medications       ALEVE PO  Take 2 tablets by mouth daily.     Butalbital-Acetaminophen 50-300 MG Tabs  Take 1 tablet by mouth every 6 (six) hours as needed (for headache).     HYDROcodone-acetaminophen 7.5-325 MG per tablet  Commonly known as:  NORCO  Take 1 tablet by mouth every 6 (six) hours as needed for pain.     ibuprofen 200 MG tablet  Commonly known as:  ADVIL,MOTRIN  Take 600 mg by mouth every 6 (six) hours as needed for pain.         Signed: Georgiann Cocker 12/23/2012, 7:55 AM

## 2012-12-23 NOTE — Progress Notes (Signed)
Subjective: Patient reports "I think I'm doing well." I don't have any headache."  Objective: Vital signs in last 24 hours: Temp:  [98.1 F (36.7 C)-98.6 F (37 C)] 98.1 F (36.7 C) (03/27 0505) Pulse Rate:  [65-90] 65 (03/27 0505) Resp:  [16-18] 16 (03/27 0505) BP: (103-126)/(60-83) 103/60 mmHg (03/27 0505) SpO2:  [98 %-100 %] 98 % (03/27 0505)  Intake/Output from previous day: 03/26 0701 - 03/27 0700 In: -  Out: 3900 [Urine:3900] Intake/Output this shift:    Alert, conversant. No report of pain. Denies h/a. MAEW. Good strength all extremities. Incision without erythema, drainage, or swelling. Site slightly sunken, as expected. Suture intact.  Lab Results: No results found for this basename: WBC, HGB, HCT, PLT,  in the last 72 hours BMET No results found for this basename: NA, K, CL, CO2, GLUCOSE, BUN, CREATININE, CALCIUM,  in the last 72 hours  Studies/Results: No results found.  Assessment/Plan: Improving   LOS: 5 days  Per Dr. Venetia Maxon, d/c foley, elevate hob gradually this am, to chair by noon, ambulate after lunch. Ok to d/c to home when ambulating safely. Pt verbalizes understanding of d/c instructions & will call office for 2week appt for suture removal. Rx's to chart: Hydrocodone 5/325 1-2 po q6hrs prn pain #50, Valium 5mg  1po q8hrs prn spasm #50   Georgiann Cocker 12/23/2012, 7:50 AM

## 2012-12-23 NOTE — Progress Notes (Signed)
Doing well  DC home

## 2012-12-28 NOTE — Discharge Summary (Signed)
D/C home. Doing well.

## 2013-03-31 ENCOUNTER — Other Ambulatory Visit: Payer: Self-pay | Admitting: Neurosurgery

## 2013-03-31 DIAGNOSIS — M5416 Radiculopathy, lumbar region: Secondary | ICD-10-CM

## 2013-04-05 ENCOUNTER — Ambulatory Visit
Admission: RE | Admit: 2013-04-05 | Discharge: 2013-04-05 | Disposition: A | Discharge status: Home / Self Care | Payer: BC Managed Care – PPO | Source: Ambulatory Visit | Attending: Neurosurgery | Admitting: Neurosurgery

## 2013-04-05 DIAGNOSIS — M5416 Radiculopathy, lumbar region: Secondary | ICD-10-CM

## 2013-04-05 MED ORDER — GADOBENATE DIMEGLUMINE 529 MG/ML IV SOLN
13.0000 mL | Freq: Once | INTRAVENOUS | Status: AC | PRN
  Administered 2013-04-05: 13 mL via INTRAVENOUS

## 2013-04-26 ENCOUNTER — Other Ambulatory Visit: Payer: Self-pay | Admitting: Neurosurgery

## 2013-04-26 DIAGNOSIS — M533 Sacrococcygeal disorders, not elsewhere classified: Secondary | ICD-10-CM

## 2013-04-27 ENCOUNTER — Ambulatory Visit
Admission: RE | Admit: 2013-04-27 | Discharge: 2013-04-27 | Disposition: A | Discharge status: Home / Self Care | Payer: BC Managed Care – PPO | Source: Ambulatory Visit | Attending: Neurosurgery | Admitting: Neurosurgery

## 2013-04-27 VITALS — BP 134/86 | HR 77

## 2013-04-27 DIAGNOSIS — M533 Sacrococcygeal disorders, not elsewhere classified: Secondary | ICD-10-CM

## 2013-04-27 MED ORDER — METHYLPREDNISOLONE ACETATE 40 MG/ML INJ SUSP (RADIOLOG
120.0000 mg | Freq: Once | INTRAMUSCULAR | Status: AC
  Administered 2013-04-27: 120 mg via EPIDURAL

## 2013-04-27 MED ORDER — IOHEXOL 180 MG/ML  SOLN
1.0000 mL | Freq: Once | INTRAMUSCULAR | Status: AC | PRN
Start: 2013-04-27 — End: 2013-04-27
  Administered 2013-04-27: 1 mL via EPIDURAL

## 2013-05-09 ENCOUNTER — Other Ambulatory Visit (HOSPITAL_COMMUNITY): Payer: Self-pay | Admitting: Neurosurgery

## 2013-05-09 ENCOUNTER — Other Ambulatory Visit: Payer: Self-pay | Admitting: Neurosurgery

## 2013-05-09 DIAGNOSIS — M545 Low back pain: Secondary | ICD-10-CM

## 2013-05-10 ENCOUNTER — Ambulatory Visit (HOSPITAL_COMMUNITY)
Admission: RE | Admit: 2013-05-10 | Discharge: 2013-05-10 | Disposition: A | Discharge status: Home / Self Care | Payer: BC Managed Care – PPO | Source: Ambulatory Visit | Attending: Neurosurgery | Admitting: Neurosurgery

## 2013-05-10 DIAGNOSIS — I700 Atherosclerosis of aorta: Secondary | ICD-10-CM | POA: Insufficient documentation

## 2013-05-10 DIAGNOSIS — I708 Atherosclerosis of other arteries: Secondary | ICD-10-CM | POA: Insufficient documentation

## 2013-05-10 DIAGNOSIS — M545 Low back pain: Secondary | ICD-10-CM

## 2013-05-10 DIAGNOSIS — M48061 Spinal stenosis, lumbar region without neurogenic claudication: Secondary | ICD-10-CM | POA: Insufficient documentation

## 2013-05-10 DIAGNOSIS — N3289 Other specified disorders of bladder: Secondary | ICD-10-CM | POA: Insufficient documentation

## 2013-05-10 DIAGNOSIS — IMO0002 Reserved for concepts with insufficient information to code with codable children: Secondary | ICD-10-CM | POA: Insufficient documentation

## 2013-05-10 DIAGNOSIS — Q762 Congenital spondylolisthesis: Secondary | ICD-10-CM | POA: Insufficient documentation

## 2013-05-10 DIAGNOSIS — M5380 Other specified dorsopathies, site unspecified: Secondary | ICD-10-CM | POA: Insufficient documentation

## 2013-05-10 DIAGNOSIS — M4040 Postural lordosis, site unspecified: Secondary | ICD-10-CM | POA: Insufficient documentation

## 2013-05-10 MED ORDER — ONDANSETRON HCL 4 MG/2ML IJ SOLN: 4.0000 mg | Freq: Four times a day (QID) | INTRAMUSCULAR | Status: DC | PRN

## 2013-05-10 MED ORDER — DIAZEPAM 5 MG PO TABS
10.0000 mg | ORAL_TABLET | Freq: Once | ORAL | Status: AC
  Administered 2013-05-10: 10 mg via ORAL
  Filled 2013-05-10: qty 2

## 2013-05-10 MED ORDER — IOHEXOL 180 MG/ML  SOLN
20.0000 mL | Freq: Once | INTRAMUSCULAR | Status: AC | PRN
  Administered 2013-05-10: 16 mL via INTRATHECAL

## 2013-05-10 MED ORDER — HYDROCODONE-ACETAMINOPHEN 10-325 MG PO TABS: 1.0000 | ORAL_TABLET | ORAL | Status: DC | PRN

## 2013-05-10 MED ORDER — HYDROCODONE-ACETAMINOPHEN 5-325 MG PO TABS
ORAL_TABLET | ORAL | Status: AC
  Administered 2013-05-10: 2
  Filled 2013-05-10: qty 2

## 2013-05-10 NOTE — Procedures (Signed)
Omnipaque 180 L23 LP no complications

## 2013-06-03 ENCOUNTER — Telehealth: Payer: Self-pay | Admitting: Hematology and Oncology

## 2013-06-03 ENCOUNTER — Other Ambulatory Visit: Payer: BC Managed Care – PPO | Admitting: Lab

## 2013-06-03 NOTE — Telephone Encounter (Signed)
Pt lmonvm 9/3 to cx 9/5 lab. Returned call and asked pt to call back to r/s lb.

## 2013-11-29 ENCOUNTER — Telehealth: Payer: Self-pay | Admitting: Hematology and Oncology

## 2013-11-29 NOTE — Telephone Encounter (Signed)
pt called and r/s lab and MD to 3/10

## 2013-12-01 ENCOUNTER — Ambulatory Visit: Payer: BC Managed Care – PPO | Admitting: Hematology and Oncology

## 2013-12-01 ENCOUNTER — Other Ambulatory Visit: Payer: BC Managed Care – PPO

## 2013-12-05 ENCOUNTER — Other Ambulatory Visit: Payer: Self-pay | Admitting: Hematology and Oncology

## 2013-12-05 DIAGNOSIS — D473 Essential (hemorrhagic) thrombocythemia: Secondary | ICD-10-CM

## 2013-12-06 ENCOUNTER — Encounter: Payer: Self-pay | Admitting: Hematology and Oncology

## 2013-12-06 ENCOUNTER — Ambulatory Visit (HOSPITAL_BASED_OUTPATIENT_CLINIC_OR_DEPARTMENT_OTHER): Payer: 59 | Admitting: Hematology and Oncology

## 2013-12-06 ENCOUNTER — Other Ambulatory Visit (HOSPITAL_BASED_OUTPATIENT_CLINIC_OR_DEPARTMENT_OTHER): Payer: 59

## 2013-12-06 VITALS — BP 123/79 | HR 73 | Temp 98.0°F | Resp 18 | Ht 66.0 in | Wt 144.4 lb

## 2013-12-06 DIAGNOSIS — D47Z9 Other specified neoplasms of uncertain behavior of lymphoid, hematopoietic and related tissue: Secondary | ICD-10-CM

## 2013-12-06 DIAGNOSIS — M549 Dorsalgia, unspecified: Secondary | ICD-10-CM

## 2013-12-06 DIAGNOSIS — D473 Essential (hemorrhagic) thrombocythemia: Secondary | ICD-10-CM

## 2013-12-06 LAB — CBC WITH DIFFERENTIAL/PLATELET
BASO%: 1.2 % (ref 0.0–2.0)
BASOS ABS: 0.1 10*3/uL (ref 0.0–0.1)
EOS ABS: 0.2 10*3/uL (ref 0.0–0.5)
EOS%: 2.9 % (ref 0.0–7.0)
HCT: 40.8 % (ref 34.8–46.6)
HEMOGLOBIN: 13.3 g/dL (ref 11.6–15.9)
LYMPH%: 28.7 % (ref 14.0–49.7)
MCH: 28.2 pg (ref 25.1–34.0)
MCHC: 32.5 g/dL (ref 31.5–36.0)
MCV: 86.6 fL (ref 79.5–101.0)
MONO#: 0.6 10*3/uL (ref 0.1–0.9)
MONO%: 8.2 % (ref 0.0–14.0)
NEUT%: 59 % (ref 38.4–76.8)
NEUTROS ABS: 4.1 10*3/uL (ref 1.5–6.5)
PLATELETS: 652 10*3/uL — AB (ref 145–400)
RBC: 4.71 10*6/uL (ref 3.70–5.45)
RDW: 15.1 % — ABNORMAL HIGH (ref 11.2–14.5)
WBC: 6.9 10*3/uL (ref 3.9–10.3)
lymph#: 2 10*3/uL (ref 0.9–3.3)

## 2013-12-06 LAB — FERRITIN CHCC: FERRITIN: 81 ng/mL (ref 9–269)

## 2013-12-06 NOTE — Progress Notes (Signed)
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 Further testing revealed abnormalities with JAK 2 mutation positive from peripheral blood, suggestive of essential thrombocytosis. She was being observed.  INTERVAL HISTORY: Carolyn Barton 58 y.o. female returns for further followup. Since she was seen here last year, she underwent spine surgery complicated by bulging disc and severe back pain. She is currently in the process of getting a second opinion. She denies any recent thrombosis after surgery. Denies any headache, skin itching, night sweats or weight loss.  I have reviewed the past medical history, past surgical history, social history and family history with the patient and they are unchanged from previous note.  ALLERGIES:  has No Known Allergies.  MEDICATIONS:  Current Outpatient Prescriptions  Medication Sig Dispense Refill  . gabapentin (NEURONTIN) 300 MG capsule Take 300 mg by mouth 3 (three) times daily.      Marland Kitchen ibuprofen (ADVIL,MOTRIN) 200 MG tablet Take 600 mg by mouth every 6 (six) hours as needed for pain.      Marland Kitchen oxyCODONE-acetaminophen (PERCOCET) 10-325 MG per tablet Take 1 tablet by mouth every 6 (six) hours as needed.       No current facility-administered medications for this visit.    REVIEW OF SYSTEMS:   Constitutional: Denies fevers, chills or abnormal weight loss Eyes: Denies blurriness of vision Ears, nose, mouth, throat, and face: Denies mucositis or sore throat Respiratory: Denies cough, dyspnea or wheezes Cardiovascular: Denies palpitation, chest discomfort or lower extremity swelling Gastrointestinal:  Denies nausea,  heartburn or change in bowel habits Skin: Denies abnormal skin rashes Lymphatics: Denies new lymphadenopathy or easy bruising Neurological:Denies numbness, tingling or new weaknesses Behavioral/Psych: Mood is stable, no new changes  All other systems were reviewed with the patient and are negative.  PHYSICAL EXAMINATION: ECOG PERFORMANCE STATUS: 0 - Asymptomatic  Filed Vitals:   12/06/13 1258  BP: 123/79  Pulse: 73  Temp: 98 F (36.7 C)  Resp: 18   Filed Weights   12/06/13 1258  Weight: 144 lb 6.4 oz (65.499 kg)    GENERAL:alert, no distress and comfortable SKIN: skin color, texture, turgor are normal, no rashes or significant lesions EYES: normal, Conjunctiva are pink and non-injected, sclera clear OROPHARYNX:no exudate, no erythema and lips, buccal mucosa, and tongue normal  NECK: supple, thyroid normal size, non-tender, without nodularity LYMPH:  no palpable lymphadenopathy in the cervical, axillary or inguinal LUNGS: clear to auscultation and percussion with normal breathing effort HEART: regular rate & rhythm and no murmurs and no lower extremity edema ABDOMEN:abdomen soft, non-tender and normal bowel sounds Musculoskeletal:no cyanosis of digits and no clubbing  NEURO: alert & oriented x 3 with fluent speech, no focal motor/sensory deficits  LABORATORY DATA:  I have reviewed the data as listed    Component Value Date/Time   NA 130* 12/18/2012 1624   NA 136 12/01/2012 0924   K 3.7 12/18/2012 1624   K 4.9 12/01/2012 0924   CL 94* 12/18/2012 1624   CL 101 12/01/2012 0924   CO2 20 12/18/2012 1624   CO2 26 12/01/2012 0924   GLUCOSE 98 12/18/2012 1624   GLUCOSE 94 12/01/2012 0924   BUN 11 12/18/2012 1624   BUN 12.5 12/01/2012 0924   CREATININE  0.60 12/18/2012 1624   CREATININE 0.8 12/01/2012 0924   CALCIUM 9.4 12/18/2012 1624   CALCIUM 10.2 12/01/2012 0924   PROT 7.6 12/01/2012 0924   PROT 6.8 11/17/2011 1128   ALBUMIN 4.0 12/01/2012 0924   ALBUMIN 4.2 11/17/2011 1128   AST 15 12/01/2012  0924   AST 16 11/17/2011 1128   ALT 21 12/01/2012 0924   ALT 12 11/17/2011 1128   ALKPHOS 92 12/01/2012 0924   ALKPHOS 69 11/17/2011 1128   BILITOT 0.48 12/01/2012 0924   BILITOT 0.5 11/17/2011 1128   GFRNONAA >90 12/18/2012 1624   GFRAA >90 12/18/2012 1624    No results found for this basename: SPEP, UPEP,  kappa and lambda light chains    Lab Results  Component Value Date   WBC 6.9 12/06/2013   NEUTROABS 4.1 12/06/2013   HGB 13.3 12/06/2013   HCT 40.8 12/06/2013   MCV 86.6 12/06/2013   PLT 652* 12/06/2013      Chemistry      Component Value Date/Time   NA 130* 12/18/2012 1624   NA 136 12/01/2012 0924   K 3.7 12/18/2012 1624   K 4.9 12/01/2012 0924   CL 94* 12/18/2012 1624   CL 101 12/01/2012 0924   CO2 20 12/18/2012 1624   CO2 26 12/01/2012 0924   BUN 11 12/18/2012 1624   BUN 12.5 12/01/2012 0924   CREATININE 0.60 12/18/2012 1624   CREATININE 0.8 12/01/2012 0924      Component Value Date/Time   CALCIUM 9.4 12/18/2012 1624   CALCIUM 10.2 12/01/2012 0924   ALKPHOS 92 12/01/2012 0924   ALKPHOS 69 11/17/2011 1128   AST 15 12/01/2012 0924   AST 16 11/17/2011 1128   ALT 21 12/01/2012 0924   ALT 12 11/17/2011 1128   BILITOT 0.48 12/01/2012 0924   BILITOT 0.5 11/17/2011 1128     ASSESSMENT & PLAN:  #1 myeloproliferative disorder There is no indication to treat. I recommend the patient to start aspirin 81 mg daily after she completed her surgical treatment for back pain #2 back pain I furnished her a letter to proceed with surgery as indicated. There is no contraindication for her to proceed with surgery for back pain. I did mention the risk of thrombosis postoperatively and recommend aggressive DVT prophylaxis.  Orders Placed This Encounter  Procedures  . CBC with Differential    Standing Status: Future     Number of Occurrences:      Standing Expiration Date: 12/06/2014   All questions were answered. The patient knows to call the clinic with any problems, questions or concerns. No barriers to learning was  detected. I spent 15 minutes counseling the patient face to face. The total time spent in the appointment was 20 minutes and more than 50% was on counseling and review of test results     [facility], [place], MD 12/06/2013 1:32 PM

## 2013-12-07 ENCOUNTER — Telehealth: Payer: Self-pay | Admitting: *Deleted

## 2013-12-07 NOTE — Telephone Encounter (Signed)
sw pt gv appt for 12/10/13 w/ labs@ 2:15pm and ov@2 :45pm. Pt is aware...td

## 2013-12-26 ENCOUNTER — Other Ambulatory Visit: Payer: Self-pay | Admitting: Neurosurgery

## 2013-12-27 ENCOUNTER — Encounter (HOSPITAL_COMMUNITY): Payer: Self-pay | Admitting: Pharmacy Technician

## 2014-01-04 ENCOUNTER — Other Ambulatory Visit (HOSPITAL_COMMUNITY): Payer: Self-pay | Admitting: *Deleted

## 2014-01-04 NOTE — Pre-Procedure Instructions (Signed)
Carolyn Barton  01/04/2014   Your procedure is scheduled on:  Wednesday, January 11, 2014 at 10:30 AM.   Report to [facility] Entrance "A" Admitting Office at 7:30 AM.   Call this number if you have problems the morning of surgery: [phone]   Remember:   Do not eat food or drink liquids after midnight Tuesday, 01/10/14.   Take these medicines the morning of surgery with A SIP OF WATER: gabapentin (NEURONTIN), oxyCODONE-acetaminophen (PERCOCET) - if needed.  Stop Vitamins and Ibuprofen as of today. Do not take any NSAIDS (Ibuprofen, Aleve, Motrin, etc) or Aspirin products prior to surgery.     Do not wear jewelry, make-up or nail polish.  Do not wear lotions, powders, or perfumes. You may wear deodorant.  Do not shave 48 hours prior to surgery.   Do not bring valuables to the hospital.  [facility] is not responsible                  for any belongings or valuables.               Contacts, dentures or bridgework may not be worn into surgery.  Leave suitcase in the car. After surgery it may be brought to your room.  For patients admitted to the hospital, discharge time is determined by your                treatment team.               Special Instructions: [place] - Preparing for Surgery  Before surgery, you can play an important role.  Because skin is not sterile, your skin needs to be as free of germs as possible.  You can reduce the number of germs on you skin by washing with CHG (chlorahexidine gluconate) soap before surgery.  CHG is an antiseptic cleaner which kills germs and bonds with the skin to continue killing germs even after washing.  Please DO NOT use if you have an allergy to CHG or antibacterial soaps.  If your skin becomes reddened/irritated stop using the CHG and inform your nurse when you arrive at Short Stay.  Do not shave (including legs and underarms) for at least 48 hours prior to the first CHG shower.  You may shave your face.  Please follow  these instructions carefully:   1.  Shower with CHG Soap the night before surgery and the                                morning of Surgery.  2.  If you choose to wash your hair, wash your hair first as usual with your       normal shampoo.  3.  After you shampoo, rinse your hair and body thoroughly to remove the                      Shampoo.  4.  Use CHG as you would any other liquid soap.  You can apply chg directly       to the skin and wash gently with scrungie or a clean washcloth.  5.  Apply the CHG Soap to your body ONLY FROM THE NECK DOWN.        Do not use on open wounds or open sores.  Avoid contact with your eyes, ears, mouth and genitals (private parts).  Wash genitals (private parts) with your  normal soap.  6.  Wash thoroughly, paying special attention to the area where your surgery        will be performed.  7.  Thoroughly rinse your body with warm water from the neck down.  8.  DO NOT shower/wash with your normal soap after using and rinsing off       the CHG Soap.  9.  Pat yourself dry with a clean towel.            10.  Wear clean pajamas.            11.  Place clean sheets on your bed the night of your first shower and do not        sleep with pets.  Day of Surgery  Do not apply any lotions the morning of surgery.  Please wear clean clothes to the hospital/surgery center.     Please read over the following fact sheets that you were given: Pain Booklet, Coughing and Deep Breathing, Blood Transfusion Information, MRSA Information and Surgical Site Infection Prevention

## 2014-01-05 ENCOUNTER — Other Ambulatory Visit (HOSPITAL_COMMUNITY): Payer: 59

## 2014-01-05 ENCOUNTER — Encounter (HOSPITAL_COMMUNITY)
Admission: RE | Admit: 2014-01-05 | Discharge: 2014-01-05 | Disposition: A | Discharge status: Home / Self Care | Payer: 59 | Source: Ambulatory Visit | Attending: Neurosurgery | Admitting: Neurosurgery

## 2014-01-05 ENCOUNTER — Encounter (HOSPITAL_COMMUNITY): Payer: Self-pay

## 2014-01-05 DIAGNOSIS — Z01812 Encounter for preprocedural laboratory examination: Secondary | ICD-10-CM | POA: Insufficient documentation

## 2014-01-05 LAB — CBC
HCT: 43 % (ref 36.0–46.0)
HEMOGLOBIN: 13.9 g/dL (ref 12.0–15.0)
MCH: 28.4 pg (ref 26.0–34.0)
MCHC: 32.3 g/dL (ref 30.0–36.0)
MCV: 87.8 fL (ref 78.0–100.0)
PLATELETS: 607 10*3/uL — AB (ref 150–400)
RBC: 4.9 MIL/uL (ref 3.87–5.11)
RDW: 14.9 % (ref 11.5–15.5)
WBC: 7 10*3/uL (ref 4.0–10.5)

## 2014-01-05 LAB — TYPE AND SCREEN
ABO/RH(D): A NEG
Antibody Screen: NEGATIVE

## 2014-01-05 LAB — BASIC METABOLIC PANEL
BUN: 10 mg/dL (ref 6–23)
CHLORIDE: 102 meq/L (ref 96–112)
CO2: 27 mEq/L (ref 19–32)
Calcium: 9.7 mg/dL (ref 8.4–10.5)
Creatinine, Ser: 0.69 mg/dL (ref 0.50–1.10)
GFR calc non Af Amer: >90 mL/min (ref >90)
Glucose, Bld: 97 mg/dL (ref 70–99)
POTASSIUM: 5.2 meq/L (ref 3.7–5.3)
Sodium: 141 mEq/L (ref 137–147)

## 2014-01-05 LAB — SURGICAL PCR SCREEN
MRSA, PCR: NEGATIVE
STAPHYLOCOCCUS AUREUS: POSITIVE — AB

## 2014-01-05 LAB — ABO/RH: ABO/RH(D): A NEG

## 2014-01-05 NOTE — Progress Notes (Signed)
Patient made aware that her nasal swab tested positive for staph and that a prescription was called in to [facility]. She verbalized understanding .

## 2014-01-10 MED ORDER — CEFAZOLIN SODIUM-DEXTROSE 2-3 GM-% IV SOLR
2.0000 g | INTRAVENOUS | Status: DC
  Filled 2014-01-10: qty 50

## 2014-01-11 ENCOUNTER — Inpatient Hospital Stay (HOSPITAL_COMMUNITY): Payer: 59

## 2014-01-11 ENCOUNTER — Encounter (HOSPITAL_COMMUNITY)
Admission: RE | Disposition: A | Discharge status: Home / Self Care | Payer: Self-pay | Source: Ambulatory Visit | Attending: Neurosurgery

## 2014-01-11 ENCOUNTER — Encounter (HOSPITAL_COMMUNITY): Payer: Self-pay | Admitting: *Deleted

## 2014-01-11 ENCOUNTER — Inpatient Hospital Stay (HOSPITAL_COMMUNITY)
Admission: RE | Admit: 2014-01-11 | Discharge: 2014-01-13 | DRG: 458 | Disposition: A | Discharge status: Home / Self Care | Payer: 59 | Source: Ambulatory Visit | Attending: Neurosurgery | Admitting: Neurosurgery

## 2014-01-11 ENCOUNTER — Encounter (HOSPITAL_COMMUNITY): Payer: 59 | Admitting: Vascular Surgery

## 2014-01-11 ENCOUNTER — Ambulatory Visit (HOSPITAL_COMMUNITY): Payer: 59 | Admitting: Anesthesiology

## 2014-01-11 DIAGNOSIS — M419 Scoliosis, unspecified: Secondary | ICD-10-CM | POA: Diagnosis present

## 2014-01-11 DIAGNOSIS — M5126 Other intervertebral disc displacement, lumbar region: Secondary | ICD-10-CM | POA: Diagnosis present

## 2014-01-11 DIAGNOSIS — M4120 Other idiopathic scoliosis, site unspecified: Principal | ICD-10-CM | POA: Diagnosis present

## 2014-01-11 DIAGNOSIS — M47817 Spondylosis without myelopathy or radiculopathy, lumbosacral region: Secondary | ICD-10-CM | POA: Diagnosis present

## 2014-01-11 DIAGNOSIS — Z87891 Personal history of nicotine dependence: Secondary | ICD-10-CM

## 2014-01-11 HISTORY — PX: MAXIMUM ACCESS (MAS)POSTERIOR LUMBAR INTERBODY FUSION (PLIF) 1 LEVEL: SHX6368

## 2014-01-11 SURGERY — FOR MAXIMUM ACCESS (MAS) POSTERIOR LUMBAR INTERBODY FUSION (PLIF) 1 LEVEL
Anesthesia: General | Site: Back

## 2014-01-11 MED ORDER — OXYCODONE-ACETAMINOPHEN 5-325 MG PO TABS: 1.0000 | ORAL_TABLET | Freq: Four times a day (QID) | ORAL | Status: DC | PRN

## 2014-01-11 MED ORDER — OXYCODONE-ACETAMINOPHEN 5-325 MG PO TABS: 1.0000 | ORAL_TABLET | ORAL | Status: DC | PRN

## 2014-01-11 MED ORDER — POLYETHYLENE GLYCOL 3350 17 G PO PACK
17.0000 g | PACK | Freq: Every day | ORAL | Status: DC | PRN
  Filled 2014-01-11: qty 1

## 2014-01-11 MED ORDER — PROPOFOL 10 MG/ML IV BOLUS
INTRAVENOUS | Status: AC
  Filled 2014-01-11: qty 20

## 2014-01-11 MED ORDER — GLYCOPYRROLATE 0.2 MG/ML IJ SOLN
INTRAMUSCULAR | Status: AC
  Filled 2014-01-11: qty 4

## 2014-01-11 MED ORDER — GLYCOPYRROLATE 0.2 MG/ML IJ SOLN
INTRAMUSCULAR | Status: DC | PRN
  Administered 2014-01-11: 0.2 mg via INTRAVENOUS

## 2014-01-11 MED ORDER — OXYCODONE HCL 5 MG PO TABS: 5.0000 mg | ORAL_TABLET | Freq: Four times a day (QID) | ORAL | Status: DC | PRN

## 2014-01-11 MED ORDER — DEXAMETHASONE SODIUM PHOSPHATE 10 MG/ML IJ SOLN
INTRAMUSCULAR | Status: AC
  Filled 2014-01-11: qty 1

## 2014-01-11 MED ORDER — OXYCODONE HCL 5 MG PO TABS
5.0000 mg | ORAL_TABLET | ORAL | Status: DC | PRN
  Administered 2014-01-11 – 2014-01-12 (×3): 5 mg via ORAL
  Filled 2014-01-11 (×3): qty 1

## 2014-01-11 MED ORDER — OXYCODONE HCL 5 MG PO TABS
5.0000 mg | ORAL_TABLET | Freq: Once | ORAL | Status: AC | PRN
  Administered 2014-01-11: 5 mg via ORAL

## 2014-01-11 MED ORDER — KCL IN DEXTROSE-NACL 20-5-0.45 MEQ/L-%-% IV SOLN
INTRAVENOUS | Status: DC
  Filled 2014-01-11 (×5): qty 1000

## 2014-01-11 MED ORDER — HYDROCODONE-ACETAMINOPHEN 5-325 MG PO TABS
1.0000 | ORAL_TABLET | ORAL | Status: DC | PRN
Start: 2014-01-11 — End: 2014-01-13
  Administered 2014-01-12 – 2014-01-13 (×9): 2 via ORAL
  Filled 2014-01-11 (×9): qty 2

## 2014-01-11 MED ORDER — BUPIVACAINE LIPOSOME 1.3 % IJ SUSP
INTRAMUSCULAR | Status: DC | PRN
  Administered 2014-01-11: 20 mL

## 2014-01-11 MED ORDER — GABAPENTIN 300 MG PO CAPS
300.0000 mg | ORAL_CAPSULE | Freq: Three times a day (TID) | ORAL | Status: DC
  Administered 2014-01-11 – 2014-01-13 (×7): 300 mg via ORAL
  Filled 2014-01-11 (×8): qty 1

## 2014-01-11 MED ORDER — LIDOCAINE-EPINEPHRINE 1 %-1:100000 IJ SOLN
INTRAMUSCULAR | Status: DC | PRN
  Administered 2014-01-11: 5 mL via INTRADERMAL

## 2014-01-11 MED ORDER — DEXTROSE 5 % IV SOLN
10.0000 mg | INTRAVENOUS | Status: DC | PRN
  Administered 2014-01-11: 20 ug/min via INTRAVENOUS

## 2014-01-11 MED ORDER — EPHEDRINE SULFATE 50 MG/ML IJ SOLN
INTRAMUSCULAR | Status: DC | PRN
  Administered 2014-01-11: 5 mg via INTRAVENOUS

## 2014-01-11 MED ORDER — PROPOFOL 10 MG/ML IV BOLUS
INTRAVENOUS | Status: DC | PRN
  Administered 2014-01-11: 130 mg via INTRAVENOUS

## 2014-01-11 MED ORDER — OXYCODONE HCL 5 MG PO TABS
ORAL_TABLET | ORAL | Status: AC
  Filled 2014-01-11: qty 1

## 2014-01-11 MED ORDER — CEFAZOLIN SODIUM 1-5 GM-% IV SOLN
1.0000 g | Freq: Three times a day (TID) | INTRAVENOUS | Status: AC
  Administered 2014-01-11 (×2): 1 g via INTRAVENOUS
  Filled 2014-01-11 (×2): qty 50

## 2014-01-11 MED ORDER — DIAZEPAM 5 MG PO TABS
ORAL_TABLET | ORAL | Status: AC
  Filled 2014-01-11: qty 1

## 2014-01-11 MED ORDER — OXYCODONE HCL 5 MG/5ML PO SOLN: 5.0000 mg | Freq: Once | ORAL | Status: AC | PRN

## 2014-01-11 MED ORDER — OXYCODONE-ACETAMINOPHEN 5-325 MG PO TABS
1.0000 | ORAL_TABLET | Freq: Four times a day (QID) | ORAL | Status: DC
  Administered 2014-01-11: 1 via ORAL

## 2014-01-11 MED ORDER — MORPHINE SULFATE 2 MG/ML IJ SOLN
1.0000 mg | INTRAMUSCULAR | Status: DC | PRN
  Administered 2014-01-11: 4 mg via INTRAVENOUS
  Filled 2014-01-11: qty 2

## 2014-01-11 MED ORDER — HYDROMORPHONE HCL PF 1 MG/ML IJ SOLN
0.2500 mg | INTRAMUSCULAR | Status: DC | PRN
  Administered 2014-01-11 (×2): 0.5 mg via INTRAVENOUS

## 2014-01-11 MED ORDER — DOCUSATE SODIUM 100 MG PO CAPS
100.0000 mg | ORAL_CAPSULE | Freq: Two times a day (BID) | ORAL | Status: DC
  Administered 2014-01-11 – 2014-01-13 (×4): 100 mg via ORAL
  Filled 2014-01-11 (×5): qty 1

## 2014-01-11 MED ORDER — FLEET ENEMA 7-19 GM/118ML RE ENEM
1.0000 | ENEMA | Freq: Once | RECTAL | Status: AC | PRN
  Filled 2014-01-11: qty 1

## 2014-01-11 MED ORDER — LIDOCAINE HCL (CARDIAC) 20 MG/ML IV SOLN
INTRAVENOUS | Status: AC
  Filled 2014-01-11: qty 5

## 2014-01-11 MED ORDER — BISACODYL 10 MG RE SUPP: 10.0000 mg | Freq: Every day | RECTAL | Status: DC | PRN

## 2014-01-11 MED ORDER — FENTANYL CITRATE 0.05 MG/ML IJ SOLN
INTRAMUSCULAR | Status: AC
  Filled 2014-01-11: qty 5

## 2014-01-11 MED ORDER — ONDANSETRON HCL 4 MG/2ML IJ SOLN
INTRAMUSCULAR | Status: AC
  Filled 2014-01-11: qty 2

## 2014-01-11 MED ORDER — DEXMEDETOMIDINE HCL IN NACL 200 MCG/50ML IV SOLN
INTRAVENOUS | Status: DC | PRN
  Administered 2014-01-11: 0.5 ug/kg/h via INTRAVENOUS

## 2014-01-11 MED ORDER — PHENOL 1.4 % MT LIQD: 1.0000 | OROMUCOSAL | Status: DC | PRN

## 2014-01-11 MED ORDER — ARTIFICIAL TEARS OP OINT
TOPICAL_OINTMENT | OPHTHALMIC | Status: DC | PRN
  Administered 2014-01-11: 1 via OPHTHALMIC

## 2014-01-11 MED ORDER — LACTATED RINGERS IV SOLN
INTRAVENOUS | Status: DC | PRN
  Administered 2014-01-11: 11:00:00 via INTRAVENOUS

## 2014-01-11 MED ORDER — LACTATED RINGERS IV SOLN
INTRAVENOUS | Status: DC
  Administered 2014-01-11: 08:00:00 via INTRAVENOUS

## 2014-01-11 MED ORDER — HYDROMORPHONE HCL PF 1 MG/ML IJ SOLN
INTRAMUSCULAR | Status: AC
  Filled 2014-01-11: qty 1

## 2014-01-11 MED ORDER — SODIUM CHLORIDE 0.9 % IJ SOLN: 3.0000 mL | INTRAMUSCULAR | Status: DC | PRN

## 2014-01-11 MED ORDER — LACTATED RINGERS IV SOLN
INTRAVENOUS | Status: DC | PRN
  Administered 2014-01-11 (×2): via INTRAVENOUS

## 2014-01-11 MED ORDER — ONDANSETRON HCL 4 MG/2ML IJ SOLN: 4.0000 mg | INTRAMUSCULAR | Status: DC | PRN

## 2014-01-11 MED ORDER — SENNA 8.6 MG PO TABS
1.0000 | ORAL_TABLET | Freq: Two times a day (BID) | ORAL | Status: DC
  Administered 2014-01-11 – 2014-01-13 (×4): 8.6 mg via ORAL
  Filled 2014-01-11 (×6): qty 1

## 2014-01-11 MED ORDER — PHENYLEPHRINE HCL 10 MG/ML IJ SOLN
INTRAMUSCULAR | Status: DC | PRN
  Administered 2014-01-11: 80 ug via INTRAVENOUS
  Administered 2014-01-11 (×2): 40 ug via INTRAVENOUS

## 2014-01-11 MED ORDER — THROMBIN 20000 UNITS EX SOLR
CUTANEOUS | Status: DC | PRN
  Administered 2014-01-11: 12:00:00 via TOPICAL

## 2014-01-11 MED ORDER — ACETAMINOPHEN 650 MG RE SUPP: 650.0000 mg | RECTAL | Status: DC | PRN

## 2014-01-11 MED ORDER — FENTANYL CITRATE 0.05 MG/ML IJ SOLN
INTRAMUSCULAR | Status: DC | PRN
  Administered 2014-01-11: 75 ug via INTRAVENOUS
  Administered 2014-01-11: 100 ug via INTRAVENOUS
  Administered 2014-01-11: 75 ug via INTRAVENOUS

## 2014-01-11 MED ORDER — DIAZEPAM 5 MG PO TABS
5.0000 mg | ORAL_TABLET | Freq: Four times a day (QID) | ORAL | Status: DC | PRN
  Administered 2014-01-11 – 2014-01-13 (×6): 5 mg via ORAL
  Filled 2014-01-11 (×6): qty 1

## 2014-01-11 MED ORDER — ONDANSETRON HCL 4 MG/2ML IJ SOLN: 4.0000 mg | Freq: Once | INTRAMUSCULAR | Status: DC | PRN

## 2014-01-11 MED ORDER — ROCURONIUM BROMIDE 100 MG/10ML IV SOLN
INTRAVENOUS | Status: DC | PRN
  Administered 2014-01-11: 25 mg via INTRAVENOUS

## 2014-01-11 MED ORDER — SODIUM CHLORIDE 0.9 % IJ SOLN
3.0000 mL | Freq: Two times a day (BID) | INTRAMUSCULAR | Status: DC
  Administered 2014-01-11 – 2014-01-12 (×2): 3 mL via INTRAVENOUS

## 2014-01-11 MED ORDER — MENTHOL 3 MG MT LOZG: 1.0000 | LOZENGE | OROMUCOSAL | Status: DC | PRN

## 2014-01-11 MED ORDER — DEXAMETHASONE SODIUM PHOSPHATE 10 MG/ML IJ SOLN
INTRAMUSCULAR | Status: DC | PRN
  Administered 2014-01-11: 10 mg via INTRAVENOUS

## 2014-01-11 MED ORDER — ONDANSETRON HCL 4 MG/2ML IJ SOLN
INTRAMUSCULAR | Status: DC | PRN
  Administered 2014-01-11: 4 mg via INTRAVENOUS

## 2014-01-11 MED ORDER — LIDOCAINE HCL (CARDIAC) 20 MG/ML IV SOLN
INTRAVENOUS | Status: DC | PRN
  Administered 2014-01-11: 20 mg via INTRAVENOUS

## 2014-01-11 MED ORDER — PHENYLEPHRINE 40 MCG/ML (10ML) SYRINGE FOR IV PUSH (FOR BLOOD PRESSURE SUPPORT)
PREFILLED_SYRINGE | INTRAVENOUS | Status: AC
  Filled 2014-01-11: qty 10

## 2014-01-11 MED ORDER — 0.9 % SODIUM CHLORIDE (POUR BTL) OPTIME
TOPICAL | Status: DC | PRN
  Administered 2014-01-11: 1000 mL

## 2014-01-11 MED ORDER — ALUM & MAG HYDROXIDE-SIMETH 200-200-20 MG/5ML PO SUSP: 30.0000 mL | Freq: Four times a day (QID) | ORAL | Status: DC | PRN

## 2014-01-11 MED ORDER — BUPIVACAINE HCL (PF) 0.5 % IJ SOLN
INTRAMUSCULAR | Status: DC | PRN
  Administered 2014-01-11: 5 mL

## 2014-01-11 MED ORDER — MIDAZOLAM HCL 2 MG/2ML IJ SOLN
INTRAMUSCULAR | Status: AC
  Filled 2014-01-11: qty 2

## 2014-01-11 MED ORDER — PANTOPRAZOLE SODIUM 40 MG IV SOLR
40.0000 mg | Freq: Every day | INTRAVENOUS | Status: DC
  Administered 2014-01-11: 40 mg via INTRAVENOUS
  Filled 2014-01-11 (×2): qty 40

## 2014-01-11 MED ORDER — BUPIVACAINE LIPOSOME 1.3 % IJ SUSP
20.0000 mL | Freq: Once | INTRAMUSCULAR | Status: DC
  Filled 2014-01-11: qty 20

## 2014-01-11 MED ORDER — DEXMEDETOMIDINE HCL IN NACL 200 MCG/50ML IV SOLN
INTRAVENOUS | Status: AC
  Filled 2014-01-11: qty 50

## 2014-01-11 MED ORDER — ACETAMINOPHEN 325 MG PO TABS: 650.0000 mg | ORAL_TABLET | ORAL | Status: DC | PRN

## 2014-01-11 MED ORDER — MIDAZOLAM HCL 5 MG/5ML IJ SOLN
INTRAMUSCULAR | Status: DC | PRN
  Administered 2014-01-11: 2 mg via INTRAVENOUS

## 2014-01-11 MED ORDER — NEOSTIGMINE METHYLSULFATE 1 MG/ML IJ SOLN
INTRAMUSCULAR | Status: AC
  Filled 2014-01-11: qty 10

## 2014-01-11 MED ORDER — OXYCODONE-ACETAMINOPHEN 5-325 MG PO TABS
1.0000 | ORAL_TABLET | ORAL | Status: DC | PRN
  Administered 2014-01-11 – 2014-01-12 (×2): 1 via ORAL
  Filled 2014-01-11 (×3): qty 1

## 2014-01-11 MED ORDER — VITAMIN D3 25 MCG (1000 UNIT) PO TABS
1000.0000 [IU] | ORAL_TABLET | Freq: Every day | ORAL | Status: DC
  Administered 2014-01-11 – 2014-01-13 (×3): 1000 [IU] via ORAL
  Filled 2014-01-11 (×3): qty 1

## 2014-01-11 MED ORDER — SUCCINYLCHOLINE CHLORIDE 20 MG/ML IJ SOLN
INTRAMUSCULAR | Status: AC
  Filled 2014-01-11: qty 1

## 2014-01-11 SURGICAL SUPPLY — 100 items
ADH SKN CLS APL DERMABOND .7 (GAUZE/BANDAGES/DRESSINGS) ×1
APL SKNCLS STERI-STRIP NONHPOA (GAUZE/BANDAGES/DRESSINGS)
BAG DECANTER FOR FLEXI CONT (MISCELLANEOUS) ×1 IMPLANT
BENZOIN TINCTURE PRP APPL 2/3 (GAUZE/BANDAGES/DRESSINGS) ×1 IMPLANT
BLADE SURG ROTATE 9660 (MISCELLANEOUS) IMPLANT
BONE MATRIX OSTEOCEL PRO MED (Bone Implant) ×2 IMPLANT
BUR MATCHSTICK NEURO 3.0 LAGG (BURR) ×3 IMPLANT
BUR PRECISION FLUTE 5.0 (BURR) ×3 IMPLANT
CAGE COROENT MP 8X23 (Cage) ×4 IMPLANT
CANISTER SUCT 3000ML (MISCELLANEOUS) ×3 IMPLANT
CLIP NEUROVISION LG (CLIP) ×2 IMPLANT
CLOSURE WOUND 1/2 X4 (GAUZE/BANDAGES/DRESSINGS)
CONT SPEC 4OZ CLIKSEAL STRL BL (MISCELLANEOUS) ×6 IMPLANT
COVER BACK TABLE 24X17X13 BIG (DRAPES) IMPLANT
COVER TABLE BACK 60X90 (DRAPES) ×3 IMPLANT
DERMABOND ADVANCED (GAUZE/BANDAGES/DRESSINGS) ×2
DERMABOND ADVANCED .7 DNX12 (GAUZE/BANDAGES/DRESSINGS) ×1 IMPLANT
DRAPE C-ARM 42X72 X-RAY (DRAPES) ×4 IMPLANT
DRAPE C-ARMOR (DRAPES) ×2 IMPLANT
DRAPE LAPAROTOMY 100X72X124 (DRAPES) ×3 IMPLANT
DRAPE POUCH INSTRU U-SHP 10X18 (DRAPES) ×3 IMPLANT
DRAPE PROXIMA HALF (DRAPES) ×4 IMPLANT
DRAPE SURG 17X23 STRL (DRAPES) ×3 IMPLANT
DRESSING TELFA 8X3 (GAUZE/BANDAGES/DRESSINGS) ×1 IMPLANT
DRSG OPSITE POSTOP 4X6 (GAUZE/BANDAGES/DRESSINGS) ×2 IMPLANT
DURAPREP 26ML APPLICATOR (WOUND CARE) ×3 IMPLANT
ELECT BLADE 4.0 EZ CLEAN MEGAD (MISCELLANEOUS) ×3
ELECT REM PT RETURN 9FT ADLT (ELECTROSURGICAL) ×3
ELECTRODE BLDE 4.0 EZ CLN MEGD (MISCELLANEOUS) IMPLANT
ELECTRODE REM PT RTRN 9FT ADLT (ELECTROSURGICAL) ×1 IMPLANT
EVACUATOR 1/8 PVC DRAIN (DRAIN) ×1 IMPLANT
GAUZE SPONGE 4X4 16PLY XRAY LF (GAUZE/BANDAGES/DRESSINGS) IMPLANT
GLOVE BIO SURGEON STRL SZ8 (GLOVE) ×6 IMPLANT
GLOVE BIOGEL PI IND STRL 7.0 (GLOVE) IMPLANT
GLOVE BIOGEL PI IND STRL 7.5 (GLOVE) IMPLANT
GLOVE BIOGEL PI IND STRL 8 (GLOVE) ×2 IMPLANT
GLOVE BIOGEL PI IND STRL 8.5 (GLOVE) ×2 IMPLANT
GLOVE BIOGEL PI INDICATOR 7.0 (GLOVE) ×4
GLOVE BIOGEL PI INDICATOR 7.5 (GLOVE) ×4
GLOVE BIOGEL PI INDICATOR 8 (GLOVE) ×2
GLOVE BIOGEL PI INDICATOR 8.5 (GLOVE) ×2
GLOVE ECLIPSE 7.5 STRL STRAW (GLOVE) ×4 IMPLANT
GLOVE ECLIPSE 8.0 STRL XLNG CF (GLOVE) ×4 IMPLANT
GLOVE EXAM NITRILE LRG STRL (GLOVE) IMPLANT
GLOVE EXAM NITRILE MD LF STRL (GLOVE) IMPLANT
GLOVE EXAM NITRILE XL STR (GLOVE) IMPLANT
GLOVE EXAM NITRILE XS STR PU (GLOVE) IMPLANT
GLOVE SURG SS PI 7.0 STRL IVOR (GLOVE) ×8 IMPLANT
GOWN BRE IMP SLV AUR LG STRL (GOWN DISPOSABLE) IMPLANT
GOWN BRE IMP SLV AUR XL STRL (GOWN DISPOSABLE) ×2 IMPLANT
GOWN STRL REIN 2XL LVL4 (GOWN DISPOSABLE) ×2 IMPLANT
GOWN STRL REUS W/ TWL LRG LVL3 (GOWN DISPOSABLE) IMPLANT
GOWN STRL REUS W/ TWL XL LVL3 (GOWN DISPOSABLE) IMPLANT
GOWN STRL REUS W/TWL 2XL LVL3 (GOWN DISPOSABLE) ×2 IMPLANT
GOWN STRL REUS W/TWL LRG LVL3 (GOWN DISPOSABLE) ×6
GOWN STRL REUS W/TWL XL LVL3 (GOWN DISPOSABLE) ×6
KIT BASIN OR (CUSTOM PROCEDURE TRAY) ×3 IMPLANT
KIT NDL NVM5 EMG ELECT (KITS) IMPLANT
KIT NEEDLE NVM5 EMG ELECT (KITS) ×1 IMPLANT
KIT NEEDLE NVM5 EMG ELECTRODE (KITS) ×2
KIT POSITION SURG JACKSON T1 (MISCELLANEOUS) ×3 IMPLANT
KIT ROOM TURNOVER OR (KITS) ×3 IMPLANT
MILL MEDIUM DISP (BLADE) ×3 IMPLANT
NDL HYPO 21X1 ECLIPSE (NEEDLE) IMPLANT
NDL HYPO 25X1 1.5 SAFETY (NEEDLE) ×1 IMPLANT
NDL SPNL 18GX3.5 QUINCKE PK (NEEDLE) IMPLANT
NEEDLE HYPO 21X1 ECLIPSE (NEEDLE) ×3 IMPLANT
NEEDLE HYPO 25X1 1.5 SAFETY (NEEDLE) ×3 IMPLANT
NEEDLE SPNL 18GX3.5 QUINCKE PK (NEEDLE) ×3 IMPLANT
NS IRRIG 1000ML POUR BTL (IV SOLUTION) ×3 IMPLANT
PACK LAMINECTOMY NEURO (CUSTOM PROCEDURE TRAY) ×3 IMPLANT
PAD ARMBOARD 7.5X6 YLW CONV (MISCELLANEOUS) ×5 IMPLANT
PATTIES SURGICAL .5 X.5 (GAUZE/BANDAGES/DRESSINGS) IMPLANT
PATTIES SURGICAL .5 X1 (DISPOSABLE) IMPLANT
PATTIES SURGICAL 1X1 (DISPOSABLE) IMPLANT
ROD 35MM (Rod) ×4 IMPLANT
SCREW LOCK (Screw) ×12 IMPLANT
SCREW LOCK FXNS SPNE MAS PL (Screw) IMPLANT
SCREW SHANK 5.0X30MM (Screw) ×2 IMPLANT
SCREW SHANK 5.0X35 (Screw) ×2 IMPLANT
SCREW SHANK 6.5X65 (Screw) ×4 IMPLANT
SCREW TULIP 5.5 (Screw) ×8 IMPLANT
SPONGE GAUZE 4X4 12PLY (GAUZE/BANDAGES/DRESSINGS) ×1 IMPLANT
SPONGE LAP 4X18 X RAY DECT (DISPOSABLE) IMPLANT
SPONGE SURGIFOAM ABS GEL 100 (HEMOSTASIS) ×3 IMPLANT
STAPLER SKIN PROX WIDE 3.9 (STAPLE) IMPLANT
STRIP CLOSURE SKIN 1/2X4 (GAUZE/BANDAGES/DRESSINGS) ×1 IMPLANT
SUT VIC AB 1 CT1 18XBRD ANBCTR (SUTURE) ×2 IMPLANT
SUT VIC AB 1 CT1 8-18 (SUTURE) ×3
SUT VIC AB 2-0 CT1 18 (SUTURE) ×4 IMPLANT
SUT VIC AB 3-0 SH 8-18 (SUTURE) ×6 IMPLANT
SYR 20CC LL (SYRINGE) ×2 IMPLANT
SYR 20ML ECCENTRIC (SYRINGE) ×3 IMPLANT
SYR 3ML LL SCALE MARK (SYRINGE) ×2 IMPLANT
SYR 5ML LL (SYRINGE) IMPLANT
TOWEL OR 17X24 6PK STRL BLUE (TOWEL DISPOSABLE) ×3 IMPLANT
TOWEL OR 17X26 10 PK STRL BLUE (TOWEL DISPOSABLE) ×3 IMPLANT
TRAP SPECIMEN MUCOUS 40CC (MISCELLANEOUS) ×3 IMPLANT
TRAY FOLEY CATH 14FRSI W/METER (CATHETERS) ×3 IMPLANT
WATER STERILE IRR 1000ML POUR (IV SOLUTION) ×3 IMPLANT

## 2014-01-11 NOTE — Anesthesia Procedure Notes (Addendum)
Procedure Name: Intubation Date/Time: 01/11/2014 10:36 AM Performed by: Judith Demps, Virgel Gess Pre-anesthesia Checklist: Patient identified, Emergency Drugs available, Suction available and Patient being monitored Patient Re-evaluated:Patient Re-evaluated prior to inductionOxygen Delivery Method: Circle system utilized Preoxygenation: Pre-oxygenation with 100% oxygen Intubation Type: IV induction Ventilation: Mask ventilation without difficulty Laryngoscope Size: Mac and 3 Grade View: Grade I Tube type: Oral Tube size: 7.5 mm Number of attempts: 1 Airway Equipment and Method: Stylet Placement Confirmation: ETT inserted through vocal cords under direct vision,  positive ETCO2,  CO2 detector and breath sounds checked- equal and bilateral Secured at: 21 cm Tube secured with: Tape Dental Injury: Teeth and Oropharynx as per pre-operative assessment

## 2014-01-11 NOTE — Interval H&P Note (Signed)
History and Physical Interval Note:  01/11/2014 8:09 AM  Carolyn Barton  has presented today for surgery, with the diagnosis of Scoliosis, Stenosis, Spondylosis, Lumbar hnp without myelopathy  The various methods of treatment have been discussed with the patient and family. After consideration of risks, benefits and other options for treatment, the patient has consented to  Procedure(s) with comments: FOR MAXIMUM ACCESS (MAS) POSTERIOR LUMBAR INTERBODY FUSION (PLIF) 1 LEVEL (N/A) - L5-S1 For maximum access posterior lumbar interbody fusion as a surgical intervention .  The patient's history has been reviewed, patient examined, no change in status, stable for surgery.  I have reviewed the patient's chart and labs.  Questions were answered to the patient's satisfaction.     [name]

## 2014-01-11 NOTE — H&P (Signed)
[place], [place] [address] Phone: [phone]   Patient ID:   [id number] Patient: Carolyn Barton  Date of Birth: July 21, 1956 Visit Type: Office Visit   Date: 12/26/2013 11:30 AM Provider: Marchia Meiers. Vertell Limber MD   This 58 year old female presents for back pain.  History of Present Illness: 1.  back pain  The patient has gone on to have continued pain in her left leg despite Dr. Maryjean Ka injections and subsequent referral to Dr. Randolm Idol at [facility].  She did get relief from a left L5 selective nerve root block recently which lasted 24 hours and was a first time she got sustained relief for any length of time.  A repeat MRI of the lumbar spine was performed which demonstrates in conjunction with scoliotic curvature and foraminal stenosis on the left at the L5 S1 level that there is a significant disc bulge at this level which appears to be causing left L5 nerve root compression within the neural foramen.  [facility] who recommended l[facility]ctomy at this level.  My review of the films indicates that the foraminal stenosis and disc degeneration with scoliosis would not be adequately addressed with simple decompressive surgery alone as a significant contributor to her pathology relates to the disc height loss, curvature, foraminal stenosis and disc protrusion, which would not be addressed with simple decompressive surgery.  Clearly the patient continues to have significant pain and she describes that she remains quite miserable.  She appears to have full strength on competition testing.  She has significant left leg pain with standing and weightbearing and is unable to do so for any length of time.  She is not able to sit on her left buttock cheek because of the severity of her pain.  Because of these findings I recommended proceeding with surgery.        PAST MEDICAL/SURGICAL HISTORY   (Reviewed, updated)  Disease/disorder Onset Date Management  Date Comments    Lumbar synovial cyst removal     DIAGNOSTICS HISTORY: Test Ordered Interpretation Result completed  TFESI - L4-L5 - L5-S1 07/12/2013   07/14/2013   Test Ordered Ordering Comments Modifier  TFESI - L4-L5 - L5-S1 07/12/2013       PAST MEDICAL HISTORY, SURGICAL HISTORY, FAMILY HISTORY, SOCIAL HISTORY AND REVIEW OF SYSTEMS I have reviewed the patient's past medical, surgical, family and social history as well as the comprehensive review of systems as included on the [facility] history form dated, which I have signed.  Family History  (Reviewed, updated)  Relationship Family Member Name Deceased Age at Death Condition Onset Age Cause of Death  Mother  Y  Cancer, unknown  Y  Mother  Y  Hypertension  N  Mother  Y  Coronary artery disease  N  Mother  Y       SOCIAL HISTORY  (Reviewed, updated) Preferred language is Unknown.   MARITAL STATUS/FAMILY/SOCIAL SUPPORT Currently married.   Smoking status: Never smoker.         MEDICATIONS(added, continued or stopped this visit):   Started Medication Directions Instruction Stopped   gabapentin 300 mg capsule take 1 capsule by oral route 3 times every day    03/29/2013 Medrol (Pak) 4 mg tablets in a dose pack take as directed  12/26/2013  04/06/2013 Mobic 7.5 mg tablet take 1 tablet by oral route  BID prn pain  12/26/2013  07/14/2013 Norco 5 mg-325 mg tablet take 1 tablet  by oral route  every 8 hours as needed for pain  12/26/2013  07/28/2013 Nucynta 50 mg tablet take 1 tablet by oral route  every 4 - 6 hours as needed not to exceed 5/day  12/26/2013   oxycodone-acetaminophen 10 mg-325 mg tablet take 1 tablet by oral route  every 6 hours as needed    03/29/2013 oxycodone-acetaminophen 5 mg-325 mg tablet take tablet by oral route  every 4 hours as needed  12/26/2013  08/16/2013 oxycodone-acetaminophen 5 mg-325 mg tablet take 1 tablet by oral route  every 6 hours as needed  12/26/2013   05/31/2013 voltaren 75mg  ORAL Take one tablet twice a day for two weeks, then take one tablet as needed  12/26/2013    ALLERGIES:  Ingredient Reaction Medication Name Comment  NO KNOWN ALLERGIES     No known allergies. Reviewed, no changes.   Vitals Date Temp F BP Pulse Ht In Wt Lb BMI BSA Pain Score  12/26/2013  127/83 90 66 140 22.6  7/10        IMPRESSION Severe left L5 radiculopathy secondary to foraminal stenosis, disc protrusion, disc height loss, scoliotic curvature.  I recommended decompression and fusion at the L5 through S1 level and she wishes to proceed.  While she's had prior surgery at L4 L5 and in fact had a synovial cyst on the right at this level, this level does not appear at this point to be severely affected and I have not recommended that anything be done at the L4 L5 level.  Assessment/Plan # Detail Type Description   1. 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       2. Assessment Lumbar radiculopathy (724.4).       3. Assessment Lumbar spinal stenosis (724.02).       4. Assessment Lumbago (724.2).       5. Assessment Lumbar scoliosis (737.30).       6. Assessment Lumbar spondylosis (721.3).         Pain Assessment/Treatment Pain Scale: 7/10. Method: Numeric Pain Intensity Scale. Location: back. Onset: 10/26/2013. Duration: varies. Quality: sharp, stabbing. Pain Assessment/Treatment follow-up plan of care: Patient currently taking pain medication as prescribed..  Patient will return to me with [facility] for brace fitting and we will proceed with surgical decompression fusion at the L5-S1 level on 01/11/14  Orders: Diagnostic Procedures: Assessment Procedure  737.30 PLIF - L5-S1             Provider:  Marchia Meiers. Vertell Limber MD  12/27/2013 11:26 AM Dictation edited by: Marchia Meiers. Vertell Limber    CC Providers: [name] MD [address] [place], [place]  [address]  ----------------------------------------------------------------------------------------------------------------------------------------------------------------------         Electronically signed by Marchia Meiers. Vertell Limber MD on 12/27/2013 11:26 AM  > [place], [place] [address] Phone: [phone]   Patient ID:   [id number] Patient: Carolyn Barton  Date of Birth: [birth year] Visit Type: Office Visit   Date: 01/09/2014 09:15 AM Provider: Marchia Meiers. Vertell Limber MD   This 58 year old female presents for Follow Up of back pain.  History of Present Illness: 1.  Follow Up of back pain  Mrs. Feeser visits today for LSO fitting.      Medical/Surgical/Interim History Reviewed, no change.  Last detailed document date:12/26/2013.   PAST MEDICAL HISTORY, SURGICAL HISTORY, FAMILY HISTORY, SOCIAL HISTORY AND REVIEW OF SYSTEMS I have reviewed the patient's past medical, surgical, family and social history as well as the comprehensive review of systems as  included on the [facility] history form dated, which I have signed.  Family History: Reviewed, no changes.  Last detailed document: 12/26/2013.   Social History: Tobacco use reviewed. Reviewed, no changes. Last detailed document date: 12/26/2013.      MEDICATIONS(added, continued or stopped this visit):   Started Medication Directions Instruction Stopped   gabapentin 300 mg capsule take 1 capsule by oral route 3 times every day     oxycodone-acetaminophen 10 mg-325 mg tablet take 1 tablet by oral route  every 6 hours as needed      ALLERGIES:  Ingredient Reaction Medication Name Comment  NO KNOWN ALLERGIES     No known allergies. Reviewed, no changes.   Vitals Date Temp F BP Pulse Ht In Wt Lb BMI BSA Pain Score  01/09/2014  125/86 71 66 148 23.89  7/10        IMPRESSION Persistent lumbar, left hip, and left leg pain.   Assessment/Plan # Detail  Type Description   1. Assessment Lumbar spondylosis (721.3).       2. Assessment Lumbar scoliosis (737.30).       3. Assessment Lumbar radiculopathy (724.4).       4. 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         Pain Assessment/Treatment Pain Scale: 7/10. Method: Numeric Pain Intensity Scale. Location: back. Onset: 10/26/2013. Duration: varies. Quality: throbbing. Pain Assessment/Treatment follow-up plan of care: Patient currently taking pain medication daily..  Medium LSO with small rigid anterior panel fitted.  Planned procedure discussed with pt, including pre-op and post-op expectations.              Provider:  Marchia Meiers. Vertell Limber MD  01/09/2014 05:11 PM Dictation edited by: Mike Craze. [place]    CC Providers: [name] MD [address] [place], [place] [address]  ----------------------------------------------------------------------------------------------------------------------------------------------------------------------         Electronically signed by Marchia Meiers. Vertell Limber MD on 01/11/2014 07:26 AM

## 2014-01-11 NOTE — Progress Notes (Signed)
pts precedex dced

## 2014-01-11 NOTE — Progress Notes (Signed)
Utilization review completed.  

## 2014-01-11 NOTE — Progress Notes (Signed)
Pt. With precedex running upon arrival to pacu at 0.49mcg/kg/hr

## 2014-01-11 NOTE — Anesthesia Preprocedure Evaluation (Addendum)
Anesthesia Evaluation  Patient identified by MRN, date of birth, ID band Patient awake    Reviewed: Allergy & Precautions, H&P , NPO status , Patient's Chart, lab work & pertinent test results  Airway Mallampati: II TM Distance: >3 FB Neck ROM: Full    Dental  (+) Teeth Intact, Dental Advisory Given   Pulmonary former smoker,  breath sounds clear to auscultation        Cardiovascular Rhythm:Regular Rate:Normal     Neuro/Psych    GI/Hepatic   Endo/Other    Renal/GU      Musculoskeletal   Abdominal   Peds  Hematology   Anesthesia Other Findings   Reproductive/Obstetrics                          Anesthesia Physical Anesthesia Plan  ASA: II  Anesthesia Plan: General   Post-op Pain Management:    Induction: Intravenous  Airway Management Planned: Oral ETT  Additional Equipment:   Intra-op Plan:   Post-operative Plan: Extubation in OR  Informed Consent: I have reviewed the patients History and Physical, chart, labs and discussed the procedure including the risks, benefits and alternatives for the proposed anesthesia with the patient or authorized representative who has indicated his/her understanding and acceptance.   Dental advisory given  Plan Discussed with: CRNA and Anesthesiologist  Anesthesia Plan Comments: (HNP L5-S1 H/O Post-op N/V  Plan GA with oral  ETT  Roberts Gaudy, MD)        Anesthesia Quick Evaluation

## 2014-01-11 NOTE — Plan of Care (Signed)
Problem: Consults Goal: Diagnosis - Spinal Surgery Outcome: Completed/Met Date Met:  01/11/14 Thoraco/Lumbar Spine Fusion

## 2014-01-11 NOTE — Brief Op Note (Addendum)
01/11/2014  1:30 PM  PATIENT:  Carolyn Barton  58 y.o. 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  SURGEON:  Surgeon(s) and Role:    * [name], MD - Primary    * Floyce Stakes, MD - Assisting  PHYSICIAN ASSISTANT:   ASSISTANTS: Poteat, RN   ANESTHESIA:   general  EBL:  Total I/O In: 1500 [I.V.:1500] Out: 575 [Urine:425; Blood:150]  BLOOD ADMINISTERED:none  DRAINS: none   LOCAL MEDICATIONS USED:  MARCAINE     SPECIMEN:  No Specimen  DISPOSITION OF SPECIMEN:  N/A  COUNTS:  YES  TOURNIQUET:  * No tourniquets in log *  DICTATION: Patient is a 58 year old with spondylosis , stenosis, scoliosis, disc herniation and severe back and left lower extremity pain at L 5 S 1level of the lumbar spine. It was elected to take her to surgery for MASPLIF L 5 S 1  level with posterolateral arthrodesis.  Procedure:   Following uncomplicated induction of GETA, and placement of electrodes for neural monitoring, patient was turned into a prone position on the [place] tableand using AP  fluoroscopy the area of planned incision was marked, prepped with betadine scrub and Duraprep, then draped. Exposure was performed of facet joint complex at L 5 S 1 level and the MAS retractor was placed.5.5 x 35 mm cortical Nuvasive screw was placed at L 5 on the right and 5.5 x 30 mm screw was placed at L 5 on the left according  to standard landmarks using neural monitoring.  A total laminectomy of L 5 was then performed with disarticulation of facets.   Decompression was greater than for standard PLIF procedure with thorough decompression of all neural elements. This bone was saved for grafting, combined with Osteocel after being run through bone mill and was placed in bone packing device.  Thorough discectomy was performed bilaterally at L  5 S 1 and the endplates were prepared for grafting.  23 x 8 x 4 degree cages were placed in the interspace and positioning was confirmed with AP and lateral fluoroscopy.  8 cc of autograft/Osteocel was packed in the interspace medial to the second cage.   Remaining screws were placed at S 1 (6.5 x 35 mm)  and 35 mm rods were placed.   And the screws were locked and torqued.Final Xrays showed well positioned implants and screw fixation. The posterolateral region was packed with remaining autograft on the right of midline. 20 cc Exparel was placed in the subcutaneous tissues.  The wounds were irrigated and then closed with 1, 2-0 and 3-0 Vicryl stitches. Sterile occlusive dressing was placed with Dermabond. The patient was then extubated in the operating room and taken to recovery in stable and satisfactory condition having tolerated her operation well. Counts were correct at the end of the case.  PLAN OF CARE: Admit to inpatient   PATIENT DISPOSITION:  PACU - hemodynamically stable.   Delay start of Pharmacological VTE agent (>24hrs) due to surgical blood loss or risk of bleeding:  yes

## 2014-01-11 NOTE — Anesthesia Postprocedure Evaluation (Signed)
  Anesthesia Post-op Note  Patient: Carolyn Barton  Procedure(s) Performed: Procedure(s) with comments: FOR MAXIMUM ACCESS (MAS) POSTERIOR LUMBAR INTERBODY FUSION Lumbar Five Sacral One (N/A) - FOR MAXIMUM ACCESS (MAS) POSTERIOR LUMBAR INTERBODY FUSION Lumbar Five Sacral One  Patient Location: PACU  Anesthesia Type:General  Level of Consciousness: awake, alert  and oriented  Airway and Oxygen Therapy: Patient Spontanous Breathing and Patient connected to nasal cannula oxygen  Post-op Pain: mild  Post-op Assessment: Post-op Vital signs reviewed, Patient's Cardiovascular Status Stable, Respiratory Function Stable, Patent Airway, No signs of Nausea or vomiting and Pain level controlled  Post-op Vital Signs: stable  Last Vitals:  Filed Vitals:   01/11/14 1526  BP: 109/57  Pulse:   Temp: 36.1 C  Resp: 10    Complications: No apparent anesthesia complications

## 2014-01-11 NOTE — Progress Notes (Signed)
Awake, alert, conversant.  Full strength both lower extremities.  Doing well.  

## 2014-01-11 NOTE — Op Note (Addendum)
01/11/2014  1:30 PM  PATIENT:  Carolyn Barton  58 y.o. 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   SURGEON:  Surgeon(s) and Role:    * [name], MD - Primary    * Floyce Stakes, MD - Assisting  PHYSICIAN ASSISTANT:   ASSISTANTS: Poteat, RN   ANESTHESIA:   general  EBL:  Total I/O In: 1500 [I.V.:1500] Out: 575 [Urine:425; Blood:150]  BLOOD ADMINISTERED:none  DRAINS: none   LOCAL MEDICATIONS USED:  MARCAINE     SPECIMEN:  No Specimen  DISPOSITION OF SPECIMEN:  N/A  COUNTS:  YES  TOURNIQUET:  * No tourniquets in log *  DICTATION: Patient is a 58 year old with spondylosis , stenosis, scoliosis, disc herniation and severe back and left lower extremity pain at L 5 S 1level of the lumbar spine. It was elected to take her to surgery for MASPLIF L 5 S 1  level with posterolateral arthrodesis.  Procedure:   Following uncomplicated induction of GETA, and placement of electrodes for neural monitoring, patient was turned into a prone position on the [place] tableand using AP  fluoroscopy the area of planned incision was marked, prepped with betadine scrub and Duraprep, then draped. Exposure was performed of facet joint complex at L 5 S 1 level and the MAS retractor was placed.5.5 x 35 mm cortical Nuvasive screw was placed at L 5 on the right and 5.5 x 30 mm screw was placed at L 5 on the left according  to standard landmarks using neural monitoring.  A total laminectomy of L 5 was then performed with disarticulation of facets.  This bone was saved for grafting, combined with Osteocel after being run through bone mill and was placed in bone packing device.  Decompression was greater than for standard PLIF procedure with thorough decompression of all neural elements. Thorough discectomy was performed bilaterally at L  5 S 1 and the endplates were prepared for grafting.  23 x 8 x 4 degree cages were placed in the interspace and positioning was confirmed with AP and lateral fluoroscopy.  8 cc of autograft/Osteocel was packed in the interspace medial to the second cage.   Remaining screws were placed at S 1 (6.5 x 35 mm)  and 35 mm rods were placed.   And the screws were locked and torqued.Final Xrays showed well positioned implants and screw fixation. The posterolateral region was packed with remaining autograft on the right of midline. 20 cc Exparel was placed in the subcutaneous tissues.  The wounds were irrigated and then closed with 1, 2-0 and 3-0 Vicryl stitches. Sterile occlusive dressing was placed with Dermabond. The patient was then extubated in the operating room and taken to recovery in stable and satisfactory condition having tolerated her operation well. Counts were correct at the end of the case.  PLAN OF CARE: Admit to inpatient   PATIENT DISPOSITION:  PACU - hemodynamically stable.   Delay start of Pharmacological VTE agent (>24hrs) due to surgical blood loss or risk of bleeding:  yes

## 2014-01-11 NOTE — Evaluation (Signed)
Occupational Therapy Evaluation Patient Details Name: Carolyn Barton MRN: [id number] DOB: Mar 01, 1956 Today's Date: 01/11/2014    History of Present Illness 58 y.o. s/p FOR MAXIMUM ACCESS (MAS) POSTERIOR LUMBAR INTERBODY FUSION Lumbar Five Sacral One (N/A) - FOR MAXIMUM ACCESS (MAS) POSTERIOR LUMBAR INTERBODY FUSION Lumbar Five Sacral One   Clinical Impression   Pt presents with below problem list. Pt independent with ADLs, PTA. Feel pt will benefit from acute OT to increase independence prior to d/c.     Follow Up Recommendations  No OT follow up;Supervision - Intermittent (when OOB/mobility)    Equipment Recommendations  3 in 1 bedside comode    Recommendations for Other Services       Precautions / Restrictions Precautions Precautions: Fall;Back Precaution Booklet Issued: Yes (comment) Precaution Comments: Reviewed precautions with pt and spouse; nurse verified okay to work with pt Required Braces or Orthoses: Spinal Brace Spinal Brace: Lumbar corset;Applied in sitting position Restrictions Weight Bearing Restrictions: No      Mobility Bed Mobility Overal bed mobility: Needs Assistance Bed Mobility: Rolling;Sidelying to Sit;Sit to Sidelying Rolling: Min guard Sidelying to sit: Min assist     Sit to sidelying: Min guard General bed mobility comments: Cues for log roll technique.  Transfers Overall transfer level: Needs assistance Equipment used: 1 person hand held assist Transfers: Sit to/from Stand Sit to Stand: Min guard;Min assist         General transfer comment: Cues for technique.    Balance                                            ADL Overall ADL's : Needs assistance/impaired                 Upper Body Dressing : Moderate assistance;Sitting (back brace)   Lower Body Dressing: Minimal assistance;Sit to/from stand;With adaptive equipment   Toilet Transfer: Ambulation;Min guard;Regular Toilet;Grab bars   Toileting-  Water quality scientist and Hygiene: Min guard;Sit to/from stand       Functional mobility during ADLs: Min guard (handheld assist/IV pole) General ADL Comments: Educated on use of cup for teeth care and placement of grooming items. Educated on AE for LB ADLs and pt practiced with reacher/sockaid. Educated on toilet aid if pt would need it-simulated later and pt appeared to be able to reach. Educated on back brace.     Vision                     Perception     Praxis      Pertinent Vitals/Pain Pain 6/10. Nurse notified. Pt premedicated.      Hand Dominance Right   Extremity/Trunk Assessment Upper Extremity Assessment Upper Extremity Assessment: Overall WFL for tasks assessed   Lower Extremity Assessment Lower Extremity Assessment: Defer to PT evaluation       Communication Communication Communication: No difficulties   Cognition Arousal/Alertness: Awake/alert Behavior During Therapy: WFL for tasks assessed/performed Overall Cognitive Status: Within Functional Limits for tasks assessed                     General Comments       Exercises       Shoulder Instructions      Home Living Family/patient expects to be discharged to:: Private residence Living Arrangements: Spouse/significant other Available Help at Discharge: Family;Available 24 hours/day Type of Home: House  Home Access: Level entry     Home Layout: One level     Bathroom Shower/Tub: Occupational psychologist: Handicapped height     Home Equipment: Shower seat - built in;Cane - single point (access to equipment)          Prior Functioning/Environment Level of Independence: Independent             OT Diagnosis: Acute pain   OT Problem List: Decreased strength;Impaired balance (sitting and/or standing);Decreased activity tolerance;Decreased range of motion;Decreased knowledge of use of DME or AE;Decreased knowledge of precautions;Pain   OT  Treatment/Interventions: Self-care/ADL training;DME and/or AE instruction;Therapeutic activities;Patient/family education;Balance training    OT Goals(Current goals can be found in the care plan section) Acute Rehab OT Goals Patient Stated Goal: get back to tennis OT Goal Formulation: With patient Time For Goal Achievement: 01/18/14 Potential to Achieve Goals: Good ADL Goals Pt Will Perform Lower Body Dressing: with modified independence;with adaptive equipment;sit to/from stand Pt Will Transfer to Toilet: with modified independence;ambulating (3 in 1 over commode) Pt Will Perform Toileting - Clothing Manipulation and hygiene: with modified independence;sit to/from stand Pt Will Perform Tub/Shower Transfer: Shower transfer;with supervision;ambulating;rolling walker (shower equipment tbd) Additional ADL Goal #1: Pt will independently verbalize and demonstrate 3/3 back precautions.    OT Frequency: Min 2X/week   Barriers to D/C:            Co-evaluation              End of Session Equipment Utilized During Treatment: Gait belt;Back brace Nurse Communication: Mobility status;Other (comment) (pain level)  Activity Tolerance: Patient tolerated treatment well Patient left: in bed;with call bell/phone within reach;with family/visitor present   Time: 1749-4496 OT Time Calculation (min): 26 min Charges:  OT General Charges $OT Visit: 1 Procedure OT Evaluation $Initial OT Evaluation Tier I: 1 Procedure OT Treatments $Self Care/Home Management : 8-22 mins G-Codes:    Benito Mccreedy OTR/L [phone] 01/11/2014, 5:59 PM

## 2014-01-11 NOTE — Transfer of Care (Signed)
Immediate Anesthesia Transfer of Care Note  Patient: Carolyn Barton  Procedure(s) Performed: Procedure(s) with comments: FOR MAXIMUM ACCESS (MAS) POSTERIOR LUMBAR INTERBODY FUSION Lumbar Five Sacral One (N/A) - FOR MAXIMUM ACCESS (MAS) POSTERIOR LUMBAR INTERBODY FUSION Lumbar Five Sacral One  Patient Location: PACU  Anesthesia Type:General  Level of Consciousness: awake, alert , oriented and sedated  Airway & Oxygen Therapy: Patient Spontanous Breathing and Patient connected to nasal cannula oxygen  Post-op Assessment: Report given to PACU RN, Post -op Vital signs reviewed and stable and Patient moving all extremities  Post vital signs: Reviewed and stable  Complications: No apparent anesthesia complications

## 2014-01-12 ENCOUNTER — Encounter (HOSPITAL_COMMUNITY): Payer: Self-pay | Admitting: Neurosurgery

## 2014-01-12 MED ORDER — PANTOPRAZOLE SODIUM 40 MG PO TBEC: 40.0000 mg | DELAYED_RELEASE_TABLET | Freq: Every day | ORAL | Status: DC

## 2014-01-12 NOTE — Progress Notes (Signed)
Occupational Therapy Treatment and Discharge Patient Details Name: Carolyn Barton MRN: [id number] DOB: 29-Oct-1955 Today's Date: 01/12/2014    History of present illness 58 y.o. s/p FOR MAXIMUM ACCESS (MAS) POSTERIOR LUMBAR INTERBODY FUSION Lumbar Five Sacral One (N/A) - FOR MAXIMUM ACCESS (MAS) POSTERIOR LUMBAR INTERBODY FUSION Lumbar Five Sacral One   OT comments  This 58 yo female presents to acute OT with all education completed, we will sign off.  Follow Up Recommendations  No OT follow up;Supervision - Intermittent    Equipment Recommendations  3 in 1 bedside comode       Precautions / Restrictions Precautions Precautions: Back Precaution Booklet Issued: Yes (comment) Precaution Comments: Pt able to state 3/3 precautions Required Braces or Orthoses: Spinal Brace Spinal Brace: Applied in sitting position Restrictions Weight Bearing Restrictions: No       Mobility Bed Mobility Overal bed mobility: Needs Assistance Bed Mobility: Rolling;Sidelying to Sit Rolling: Supervision Sidelying to sit: Supervision       General bed mobility comments: Vcs for technique  Transfers Overall transfer level: Needs assistance Equipment used: Rolling walker (2 wheeled) Transfers: Sit to/from Stand Sit to Stand: Min guard         General transfer comment: Cues for safe hand placement        ADL Overall ADL's : Needs assistance/impaired                         Toilet Transfer: Supervision/safety;Comfort height toilet;Grab bars   Toileting- Clothing Manipulation and Hygiene: Supervision/safety;Sit to/from stand Toileting - Clothing Manipulation Details (indicate cue type and reason): Pt reports she will use wet wipes at home for back peri-care       General ADL Comments: Pt reports she does not feel geting in and out of shower stall will be an issue. She will borrow a reacher from a friend and does not plan on wearing socks and only slip on shoes. She says if  she needs to wear socks she will do them if she can by crossing her legs over one another or ask her husband to A her. She is aware that she should dress her UB first and then her lower body so that her brace will be on. Pt able to donn her own back brace                Cognition   Behavior During Therapy: [facility] for tasks assessed/performed Overall Cognitive Status: Within Functional Limits for tasks assessed                                    Pertinent Vitals/ Pain       7/10--RN made aware  Home Living Family/patient expects to be discharged to:: Private residence Living Arrangements: Spouse/significant other Available Help at Discharge: Family;Available 24 hours/day                                        Frequency Min 2X/week     Progress Toward Goals  OT Goals(current goals can now be found in the care plan section)  Progress towards OT goals: Goals met/education completed, patient discharged from [place] Discharge plan remains appropriate       End of Session Equipment Utilized During Treatment: Back brace;Rolling walker  Activity Tolerance Patient tolerated treatment well   Patient Left in chair;with call bell/phone within reach;with family/visitor present   Nurse Communication Patient requests pain meds        Time: 2256-7209 OT Time Calculation (min): 23 min  Charges: OT General Charges $OT Visit: 1 Procedure OT Treatments $Self Care/Home Management : 23-37 mins  Almon Register [phone] 01/12/2014, 11:25 AM

## 2014-01-12 NOTE — Progress Notes (Signed)
PT Cancellation Note  Patient Details Name: Carolyn Barton MRN: [id number] DOB: 1955-11-13   Cancelled Treatment:    Reason Eval/Treat Not Completed: PT screened, no needs identified, will sign off.  Pt ambulating mod I with RW and her husband in the hallway.  Per RN she has been getting up appropriately.  Education on precautions completed by OT (see OT notes for details).    PT to sign off.    Thanks,    Barbarann Ehlers. Debbra Digiulio, PT, DPT [phone]   01/12/2014, 1:25 PM

## 2014-01-12 NOTE — Progress Notes (Signed)
Subjective: Patient reports "I'm sore in my back, but my leg is good"  Objective: Vital signs in last 24 hours: Temp:  [97 F (36.1 C)-98.8 F (37.1 C)] 98.8 F (37.1 C) (04/16 0813) Pulse Rate:  [56-101] 101 (04/16 0813) Resp:  [10-25] 18 (04/16 0813) BP: (99-134)/(42-92) 117/78 mmHg (04/16 0813) SpO2:  [94 %-100 %] 97 % (04/16 0813)  Intake/Output from previous day: 04/15 0701 - 04/16 0700 In: 2260 [P.O.:360; I.V.:1900] Out: 2125 [Urine:1975; Blood:150] Intake/Output this shift:    Alert, conversant, husband present. Some left buttock pain and incisional soreness, but no leg pain. Incision without erythema,swelling, or drainage beneath honeycomb drsg & Dermabond. Good strength BLE. Voiding without difficulty. No BM yet.   Lab Results: No results found for this basename: WBC, HGB, HCT, PLT,  in the last 72 hours BMET No results found for this basename: NA, K, CL, CO2, GLUCOSE, BUN, CREATININE, CALCIUM,  in the last 72 hours  Studies/Results: Dg Lumbar Spine 2-3 Views  01/11/2014   CLINICAL DATA:  Lumbar fusion  EXAM: LUMBAR SPINE - 2-3 VIEW; DG C-ARM 1-60 MIN  COMPARISON:  12/07/2013 and earlier studies  FINDINGS: Two intraoperative fluoroscopic spot images document placement of bilateral pedicle screws at L5 and S1. Graft markers placed in the L5-S1 interspace.  IMPRESSION: PLIF L5-S1.   Electronically Signed   By: Arne Cleveland M.D.   On: 01/11/2014 16:42   Dg C-arm 1-60 Min  01/11/2014   CLINICAL DATA:  Lumbar fusion  EXAM: LUMBAR SPINE - 2-3 VIEW; DG C-ARM 1-60 MIN  COMPARISON:  12/07/2013 and earlier studies  FINDINGS: Two intraoperative fluoroscopic spot images document placement of bilateral pedicle screws at L5 and S1. Graft markers placed in the L5-S1 interspace.  IMPRESSION: PLIF L5-S1.   Electronically Signed   By: Arne Cleveland M.D.   On: 01/11/2014 16:42    Assessment/Plan: Improving   LOS: 1 day  Mobilize in LSO with PT.   Aaron Edelman Raedyn Wenke 01/12/2014, 10:34  AM

## 2014-01-13 NOTE — Progress Notes (Signed)
Pt and husband given D/C instructions with Rx's, verbal understanding of teaching was given. Pt D/C'd home via wheelchair @ 1610 per MD order. Pt's IV was removed prior to D/C. Pt is stable and has no other needs. Holli Humbles, RN

## 2014-01-13 NOTE — Discharge Summary (Signed)
Physician Discharge Summary  Patient ID: Carolyn Barton MRN: [id number] DOB/AGE: October 15, 1955 58 y.o.  Admit date: 01/11/2014 Discharge date: 01/13/2014  Admission Diagnoses:lumbar degenerative disc disease. scoliosis  Discharge Diagnoses:  Active Problems:   Scoliosis of lumbar spine   Discharged Condition: ambulating  Hospital Course: surgery  Consults: none  Significant Diagnostic Studies: mri  Treatments: lumbar fusion  Discharge Exam: Blood pressure 108/71, pulse 82, temperature 99.5 F (37.5 C), temperature source Oral, resp. rate 16, height 5\' 6"  (1.676 m), weight 67.132 kg (148 lb), SpO2 95.00%. Ambulating. Wants to go home  Disposition: 01-Home or Self Care   Future Appointments Provider Department Dept Phone   12/11/2014 2:15 PM [facility] [phone]   12/11/2014 2:45 PM Heath Lark, MD [facility] [phone]       Medication List    ASK your doctor about these medications       cholecalciferol 1000 UNITS tablet  Commonly known as:  VITAMIN D  Take 1,000 Units by mouth daily.     gabapentin 300 MG capsule  Commonly known as:  NEURONTIN  Take 300 mg by mouth 3 (three) times daily.     ibuprofen 200 MG tablet  Commonly known as:  ADVIL,MOTRIN  Take 600 mg by mouth every 6 (six) hours as needed for pain.     oxyCODONE-acetaminophen 10-325 MG per tablet  Commonly known as:  PERCOCET  Take 1 tablet by mouth every 6 (six) hours as needed for pain.         Signed: Floyce Stakes 01/13/2014, 3:39 PM

## 2014-01-25 ENCOUNTER — Ambulatory Visit
Admission: RE | Admit: 2014-01-25 | Discharge: 2014-01-25 | Disposition: A | Discharge status: Home / Self Care | Payer: 59 | Source: Ambulatory Visit | Attending: Neurosurgery | Admitting: Neurosurgery

## 2014-01-25 ENCOUNTER — Other Ambulatory Visit: Payer: Self-pay | Admitting: Neurosurgery

## 2014-01-25 DIAGNOSIS — M545 Low back pain, unspecified: Secondary | ICD-10-CM

## 2014-01-26 ENCOUNTER — Encounter (HOSPITAL_COMMUNITY): Payer: 59 | Admitting: Anesthesiology

## 2014-01-26 ENCOUNTER — Ambulatory Visit (HOSPITAL_COMMUNITY): Payer: 59

## 2014-01-26 ENCOUNTER — Ambulatory Visit (HOSPITAL_COMMUNITY)
Admission: RE | Admit: 2014-01-26 | Discharge: 2014-01-27 | Disposition: A | Discharge status: Home / Self Care | Payer: 59 | Source: Ambulatory Visit | Attending: Neurosurgery | Admitting: Neurosurgery

## 2014-01-26 ENCOUNTER — Other Ambulatory Visit: Payer: Self-pay | Admitting: Neurosurgery

## 2014-01-26 ENCOUNTER — Encounter (HOSPITAL_COMMUNITY)
Admission: RE | Disposition: A | Discharge status: Home / Self Care | Payer: Self-pay | Source: Ambulatory Visit | Attending: Neurosurgery

## 2014-01-26 ENCOUNTER — Ambulatory Visit (HOSPITAL_COMMUNITY): Payer: 59 | Admitting: Anesthesiology

## 2014-01-26 DIAGNOSIS — R030 Elevated blood-pressure reading, without diagnosis of hypertension: Secondary | ICD-10-CM | POA: Insufficient documentation

## 2014-01-26 DIAGNOSIS — IMO0002 Reserved for concepts with insufficient information to code with codable children: Secondary | ICD-10-CM | POA: Insufficient documentation

## 2014-01-26 DIAGNOSIS — Z87891 Personal history of nicotine dependence: Secondary | ICD-10-CM | POA: Insufficient documentation

## 2014-01-26 DIAGNOSIS — Y831 Surgical operation with implant of artificial internal device as the cause of abnormal reaction of the patient, or of later complication, without mention of misadventure at the time of the procedure: Secondary | ICD-10-CM | POA: Insufficient documentation

## 2014-01-26 DIAGNOSIS — D759 Disease of blood and blood-forming organs, unspecified: Secondary | ICD-10-CM | POA: Insufficient documentation

## 2014-01-26 DIAGNOSIS — M5416 Radiculopathy, lumbar region: Secondary | ICD-10-CM | POA: Diagnosis present

## 2014-01-26 DIAGNOSIS — T8489XA Other specified complication of internal orthopedic prosthetic devices, implants and grafts, initial encounter: Secondary | ICD-10-CM | POA: Insufficient documentation

## 2014-01-26 HISTORY — PX: OTHER SURGICAL HISTORY: SHX169

## 2014-01-26 LAB — CBC
HEMATOCRIT: 41.2 % (ref 36.0–46.0)
Hemoglobin: 13.2 g/dL (ref 12.0–15.0)
MCH: 28.3 pg (ref 26.0–34.0)
MCHC: 32 g/dL (ref 30.0–36.0)
MCV: 88.4 fL (ref 78.0–100.0)
Platelets: 707 10*3/uL — ABNORMAL HIGH (ref 150–400)
RBC: 4.66 MIL/uL (ref 3.87–5.11)
RDW: 15.2 % (ref 11.5–15.5)
WBC: 9.8 10*3/uL (ref 4.0–10.5)

## 2014-01-26 LAB — BASIC METABOLIC PANEL
BUN: 12 mg/dL (ref 6–23)
CALCIUM: 9.5 mg/dL (ref 8.4–10.5)
CHLORIDE: 103 meq/L (ref 96–112)
CO2: 24 meq/L (ref 19–32)
Creatinine, Ser: 0.65 mg/dL (ref 0.50–1.10)
GFR calc Af Amer: >90 mL/min (ref >90)
GFR calc non Af Amer: >90 mL/min (ref >90)
GLUCOSE: 72 mg/dL (ref 70–99)
Potassium: 4.3 mEq/L (ref 3.7–5.3)
Sodium: 142 mEq/L (ref 137–147)

## 2014-01-26 SURGERY — POSTERIOR LUMBAR FUSION 1 LEVEL
Anesthesia: General | Site: Back

## 2014-01-26 MED ORDER — METHOCARBAMOL 500 MG PO TABS
500.0000 mg | ORAL_TABLET | Freq: Four times a day (QID) | ORAL | Status: DC | PRN
  Administered 2014-01-27: 500 mg via ORAL
  Filled 2014-01-26: qty 1

## 2014-01-26 MED ORDER — MIDAZOLAM HCL 2 MG/2ML IJ SOLN
INTRAMUSCULAR | Status: AC
  Filled 2014-01-26: qty 2

## 2014-01-26 MED ORDER — GLYCOPYRROLATE 0.2 MG/ML IJ SOLN
INTRAMUSCULAR | Status: AC
  Filled 2014-01-26: qty 2

## 2014-01-26 MED ORDER — LIDOCAINE HCL (CARDIAC) 20 MG/ML IV SOLN
INTRAVENOUS | Status: DC | PRN
  Administered 2014-01-26: 100 mg via INTRAVENOUS

## 2014-01-26 MED ORDER — SODIUM CHLORIDE 0.9 % IJ SOLN: 3.0000 mL | INTRAMUSCULAR | Status: DC | PRN

## 2014-01-26 MED ORDER — LIDOCAINE-EPINEPHRINE 1 %-1:100000 IJ SOLN
INTRAMUSCULAR | Status: DC | PRN
  Administered 2014-01-26: 5 mL via INTRADERMAL
  Administered 2014-01-26: 30 mL

## 2014-01-26 MED ORDER — BISACODYL 10 MG RE SUPP: 10.0000 mg | Freq: Every day | RECTAL | Status: DC | PRN

## 2014-01-26 MED ORDER — SENNA 8.6 MG PO TABS
1.0000 | ORAL_TABLET | Freq: Two times a day (BID) | ORAL | Status: DC
  Filled 2014-01-26 (×2): qty 1

## 2014-01-26 MED ORDER — FENTANYL CITRATE 0.05 MG/ML IJ SOLN
INTRAMUSCULAR | Status: DC | PRN
  Administered 2014-01-26: 100 ug via INTRAVENOUS
  Administered 2014-01-26 (×3): 50 ug via INTRAVENOUS

## 2014-01-26 MED ORDER — HYDROCODONE-ACETAMINOPHEN 5-325 MG PO TABS
1.0000 | ORAL_TABLET | ORAL | Status: DC | PRN
  Administered 2014-01-27 (×2): 2 via ORAL
  Filled 2014-01-26 (×2): qty 2

## 2014-01-26 MED ORDER — ONDANSETRON HCL 4 MG/2ML IJ SOLN
INTRAMUSCULAR | Status: AC
  Filled 2014-01-26: qty 2

## 2014-01-26 MED ORDER — SODIUM CHLORIDE 0.9 % IV SOLN
250.0000 mL | INTRAVENOUS | Status: DC
Start: 2014-01-26 — End: 2014-01-27

## 2014-01-26 MED ORDER — PANTOPRAZOLE SODIUM 40 MG IV SOLR
40.0000 mg | Freq: Every day | INTRAVENOUS | Status: DC
  Filled 2014-01-26: qty 40

## 2014-01-26 MED ORDER — DEXAMETHASONE SODIUM PHOSPHATE 10 MG/ML IJ SOLN
INTRAMUSCULAR | Status: AC
  Filled 2014-01-26: qty 1

## 2014-01-26 MED ORDER — ACETAMINOPHEN 325 MG PO TABS: 650.0000 mg | ORAL_TABLET | ORAL | Status: DC | PRN

## 2014-01-26 MED ORDER — ONDANSETRON HCL 4 MG/2ML IJ SOLN
INTRAMUSCULAR | Status: DC | PRN
  Administered 2014-01-26: 4 mg via INTRAVENOUS

## 2014-01-26 MED ORDER — CEFAZOLIN SODIUM-DEXTROSE 2-3 GM-% IV SOLR
INTRAVENOUS | Status: AC
  Administered 2014-01-26: 2 g via INTRAVENOUS
  Filled 2014-01-26: qty 50

## 2014-01-26 MED ORDER — VITAMIN D3 25 MCG (1000 UNIT) PO TABS
1000.0000 [IU] | ORAL_TABLET | Freq: Every day | ORAL | Status: DC
  Administered 2014-01-26: 1000 [IU] via ORAL
  Filled 2014-01-26 (×2): qty 1

## 2014-01-26 MED ORDER — ALUM & MAG HYDROXIDE-SIMETH 200-200-20 MG/5ML PO SUSP: 30.0000 mL | Freq: Four times a day (QID) | ORAL | Status: DC | PRN

## 2014-01-26 MED ORDER — PHENYLEPHRINE HCL 10 MG/ML IJ SOLN
INTRAMUSCULAR | Status: DC | PRN
  Administered 2014-01-26 (×3): 80 ug via INTRAVENOUS

## 2014-01-26 MED ORDER — FLEET ENEMA 7-19 GM/118ML RE ENEM
1.0000 | ENEMA | Freq: Once | RECTAL | Status: AC | PRN
  Filled 2014-01-26: qty 1

## 2014-01-26 MED ORDER — THROMBIN 20000 UNITS EX SOLR
CUTANEOUS | Status: DC | PRN
  Administered 2014-01-26: 19:00:00 via TOPICAL

## 2014-01-26 MED ORDER — SODIUM CHLORIDE 0.9 % IJ SOLN: 3.0000 mL | Freq: Two times a day (BID) | INTRAMUSCULAR | Status: DC

## 2014-01-26 MED ORDER — MIDAZOLAM HCL 5 MG/5ML IJ SOLN
INTRAMUSCULAR | Status: DC | PRN
  Administered 2014-01-26: 2 mg via INTRAVENOUS

## 2014-01-26 MED ORDER — CEFAZOLIN SODIUM 1-5 GM-% IV SOLN
1.0000 g | Freq: Three times a day (TID) | INTRAVENOUS | Status: DC
  Administered 2014-01-27: 1 g via INTRAVENOUS
  Filled 2014-01-26 (×2): qty 50

## 2014-01-26 MED ORDER — HYDROMORPHONE HCL PF 1 MG/ML IJ SOLN
INTRAMUSCULAR | Status: AC
  Filled 2014-01-26: qty 1

## 2014-01-26 MED ORDER — MIDAZOLAM HCL 2 MG/2ML IJ SOLN: 0.5000 mg | Freq: Once | INTRAMUSCULAR | Status: DC | PRN

## 2014-01-26 MED ORDER — OXYCODONE HCL 5 MG/5ML PO SOLN: 5.0000 mg | Freq: Once | ORAL | Status: AC | PRN

## 2014-01-26 MED ORDER — PROMETHAZINE HCL 25 MG/ML IJ SOLN: 6.2500 mg | INTRAMUSCULAR | Status: DC | PRN

## 2014-01-26 MED ORDER — DIAZEPAM 5 MG PO TABS
5.0000 mg | ORAL_TABLET | Freq: Four times a day (QID) | ORAL | Status: DC | PRN
  Administered 2014-01-26: 5 mg via ORAL

## 2014-01-26 MED ORDER — PANTOPRAZOLE SODIUM 40 MG PO TBEC
40.0000 mg | DELAYED_RELEASE_TABLET | Freq: Every day | ORAL | Status: DC
  Administered 2014-01-26: 40 mg via ORAL

## 2014-01-26 MED ORDER — OXYCODONE HCL 5 MG PO TABS: 15.0000 mg | ORAL_TABLET | ORAL | Status: DC | PRN

## 2014-01-26 MED ORDER — DOCUSATE SODIUM 100 MG PO CAPS
100.0000 mg | ORAL_CAPSULE | Freq: Two times a day (BID) | ORAL | Status: DC
  Administered 2014-01-26: 100 mg via ORAL
  Filled 2014-01-26 (×3): qty 1

## 2014-01-26 MED ORDER — SENNOSIDES-DOCUSATE SODIUM 8.6-50 MG PO TABS
1.0000 | ORAL_TABLET | Freq: Every evening | ORAL | Status: DC | PRN
  Filled 2014-01-26: qty 1

## 2014-01-26 MED ORDER — KCL IN DEXTROSE-NACL 20-5-0.45 MEQ/L-%-% IV SOLN
INTRAVENOUS | Status: DC
  Administered 2014-01-26: 23:00:00 via INTRAVENOUS
  Filled 2014-01-26 (×3): qty 1000

## 2014-01-26 MED ORDER — ACETAMINOPHEN 650 MG RE SUPP: 650.0000 mg | RECTAL | Status: DC | PRN

## 2014-01-26 MED ORDER — PHENYLEPHRINE 40 MCG/ML (10ML) SYRINGE FOR IV PUSH (FOR BLOOD PRESSURE SUPPORT)
PREFILLED_SYRINGE | INTRAVENOUS | Status: AC
  Filled 2014-01-26: qty 10

## 2014-01-26 MED ORDER — ROCURONIUM BROMIDE 100 MG/10ML IV SOLN
INTRAVENOUS | Status: DC | PRN
  Administered 2014-01-26: 40 mg via INTRAVENOUS

## 2014-01-26 MED ORDER — FENTANYL CITRATE 0.05 MG/ML IJ SOLN
INTRAMUSCULAR | Status: AC
  Filled 2014-01-26: qty 5

## 2014-01-26 MED ORDER — METHOCARBAMOL 1000 MG/10ML IJ SOLN
500.0000 mg | Freq: Four times a day (QID) | INTRAMUSCULAR | Status: DC | PRN
  Filled 2014-01-26: qty 5

## 2014-01-26 MED ORDER — NEOSTIGMINE METHYLSULFATE 10 MG/10ML IV SOLN
INTRAVENOUS | Status: DC | PRN
  Administered 2014-01-26: 3 mg via INTRAVENOUS

## 2014-01-26 MED ORDER — 0.9 % SODIUM CHLORIDE (POUR BTL) OPTIME
TOPICAL | Status: DC | PRN
  Administered 2014-01-26: 1000 mL

## 2014-01-26 MED ORDER — NEOSTIGMINE METHYLSULFATE 10 MG/10ML IV SOLN
INTRAVENOUS | Status: AC
  Filled 2014-01-26: qty 1

## 2014-01-26 MED ORDER — MENTHOL 3 MG MT LOZG: 1.0000 | LOZENGE | OROMUCOSAL | Status: DC | PRN

## 2014-01-26 MED ORDER — OXYCODONE-ACETAMINOPHEN 10-325 MG PO TABS: 1.0000 | ORAL_TABLET | ORAL | Status: DC | PRN

## 2014-01-26 MED ORDER — LIDOCAINE HCL (CARDIAC) 20 MG/ML IV SOLN
INTRAVENOUS | Status: AC
  Filled 2014-01-26: qty 5

## 2014-01-26 MED ORDER — BUPIVACAINE HCL (PF) 0.5 % IJ SOLN
INTRAMUSCULAR | Status: DC | PRN
  Administered 2014-01-26: 5 mL

## 2014-01-26 MED ORDER — ONDANSETRON HCL 4 MG/2ML IJ SOLN: 4.0000 mg | INTRAMUSCULAR | Status: DC | PRN

## 2014-01-26 MED ORDER — GLYCOPYRROLATE 0.2 MG/ML IJ SOLN
INTRAMUSCULAR | Status: DC | PRN
  Administered 2014-01-26: 0.4 mg via INTRAVENOUS

## 2014-01-26 MED ORDER — PHENOL 1.4 % MT LIQD: 1.0000 | OROMUCOSAL | Status: DC | PRN

## 2014-01-26 MED ORDER — LACTATED RINGERS IV SOLN
INTRAVENOUS | Status: DC | PRN
  Administered 2014-01-26 (×2): via INTRAVENOUS

## 2014-01-26 MED ORDER — HYDROMORPHONE HCL PF 1 MG/ML IJ SOLN
0.2500 mg | INTRAMUSCULAR | Status: DC | PRN
  Administered 2014-01-26 (×4): 0.5 mg via INTRAVENOUS

## 2014-01-26 MED ORDER — LACTATED RINGERS IV SOLN: INTRAVENOUS | Status: DC

## 2014-01-26 MED ORDER — MEPERIDINE HCL 25 MG/ML IJ SOLN: 6.2500 mg | INTRAMUSCULAR | Status: DC | PRN

## 2014-01-26 MED ORDER — CEFAZOLIN SODIUM-DEXTROSE 2-3 GM-% IV SOLR: 2.0000 g | INTRAVENOUS | Status: DC

## 2014-01-26 MED ORDER — OXYCODONE HCL 5 MG PO TABS
5.0000 mg | ORAL_TABLET | Freq: Once | ORAL | Status: AC | PRN
  Administered 2014-01-26: 5 mg via ORAL

## 2014-01-26 MED ORDER — PROPOFOL 10 MG/ML IV BOLUS
INTRAVENOUS | Status: AC
  Filled 2014-01-26: qty 20

## 2014-01-26 MED ORDER — DIAZEPAM 5 MG PO TABS
ORAL_TABLET | ORAL | Status: AC
  Filled 2014-01-26: qty 1

## 2014-01-26 MED ORDER — DEXAMETHASONE SODIUM PHOSPHATE 10 MG/ML IJ SOLN
INTRAMUSCULAR | Status: DC | PRN
  Administered 2014-01-26: 10 mg via INTRAVENOUS

## 2014-01-26 MED ORDER — GABAPENTIN 300 MG PO CAPS
300.0000 mg | ORAL_CAPSULE | Freq: Three times a day (TID) | ORAL | Status: DC
  Administered 2014-01-26: 300 mg via ORAL
  Filled 2014-01-26 (×4): qty 1

## 2014-01-26 MED ORDER — PROPOFOL 10 MG/ML IV BOLUS
INTRAVENOUS | Status: DC | PRN
  Administered 2014-01-26: 180 mg via INTRAVENOUS

## 2014-01-26 SURGICAL SUPPLY — 79 items
ADH SKN CLS APL DERMABOND .7 (GAUZE/BANDAGES/DRESSINGS) ×1
APL SKNCLS STERI-STRIP NONHPOA (GAUZE/BANDAGES/DRESSINGS) ×1
BAG DECANTER FOR FLEXI CONT (MISCELLANEOUS) ×3 IMPLANT
BENZOIN TINCTURE PRP APPL 2/3 (GAUZE/BANDAGES/DRESSINGS) ×3 IMPLANT
BLADE 10 SAFETY STRL DISP (BLADE) ×3 IMPLANT
BLADE SURG ROTATE 9660 (MISCELLANEOUS) IMPLANT
BUR MATCHSTICK NEURO 3.0 LAGG (BURR) ×3 IMPLANT
BUR PRECISION FLUTE 5.0 (BURR) ×3 IMPLANT
CANISTER SUCT 3000ML (MISCELLANEOUS) ×3 IMPLANT
CLOSURE WOUND 1/2 X4 (GAUZE/BANDAGES/DRESSINGS) ×1
CONT SPEC 4OZ CLIKSEAL STRL BL (MISCELLANEOUS) ×6 IMPLANT
COVER BACK TABLE 24X17X13 BIG (DRAPES) IMPLANT
COVER TABLE BACK 60X90 (DRAPES) ×3 IMPLANT
DERMABOND ADVANCED (GAUZE/BANDAGES/DRESSINGS) ×2
DERMABOND ADVANCED .7 DNX12 (GAUZE/BANDAGES/DRESSINGS) ×1 IMPLANT
DRAPE C-ARM 42X72 X-RAY (DRAPES) ×4 IMPLANT
DRAPE C-ARMOR (DRAPES) ×2 IMPLANT
DRAPE LAPAROTOMY 100X72X124 (DRAPES) ×3 IMPLANT
DRAPE POUCH INSTRU U-SHP 10X18 (DRAPES) ×3 IMPLANT
DRAPE SURG 17X23 STRL (DRAPES) ×3 IMPLANT
DRESSING TELFA 8X3 (GAUZE/BANDAGES/DRESSINGS) ×3 IMPLANT
DRSG OPSITE POSTOP 4X6 (GAUZE/BANDAGES/DRESSINGS) ×2 IMPLANT
DURAPREP 26ML APPLICATOR (WOUND CARE) ×3 IMPLANT
ELECT REM PT RETURN 9FT ADLT (ELECTROSURGICAL) ×3
ELECTRODE REM PT RTRN 9FT ADLT (ELECTROSURGICAL) ×1 IMPLANT
EVACUATOR 1/8 PVC DRAIN (DRAIN) ×3 IMPLANT
GAUZE SPONGE 4X4 16PLY XRAY LF (GAUZE/BANDAGES/DRESSINGS) IMPLANT
GLOVE BIO SURGEON STRL SZ8 (GLOVE) ×6 IMPLANT
GLOVE BIOGEL PI IND STRL 7.5 (GLOVE) IMPLANT
GLOVE BIOGEL PI IND STRL 8 (GLOVE) ×2 IMPLANT
GLOVE BIOGEL PI IND STRL 8.5 (GLOVE) ×2 IMPLANT
GLOVE BIOGEL PI INDICATOR 7.5 (GLOVE) ×2
GLOVE BIOGEL PI INDICATOR 8 (GLOVE) ×6
GLOVE BIOGEL PI INDICATOR 8.5 (GLOVE) ×4
GLOVE ECLIPSE 7.5 STRL STRAW (GLOVE) ×8 IMPLANT
GLOVE ECLIPSE 8.0 STRL XLNG CF (GLOVE) ×6 IMPLANT
GLOVE EXAM NITRILE LRG STRL (GLOVE) IMPLANT
GLOVE EXAM NITRILE MD LF STRL (GLOVE) IMPLANT
GLOVE EXAM NITRILE XL STR (GLOVE) IMPLANT
GLOVE EXAM NITRILE XS STR PU (GLOVE) IMPLANT
GOWN BRE IMP SLV AUR LG STRL (GOWN DISPOSABLE) IMPLANT
GOWN BRE IMP SLV AUR XL STRL (GOWN DISPOSABLE) ×2 IMPLANT
GOWN STRL REIN 2XL LVL4 (GOWN DISPOSABLE) ×2 IMPLANT
GOWN STRL REUS W/ TWL LRG LVL3 (GOWN DISPOSABLE) IMPLANT
GOWN STRL REUS W/ TWL XL LVL3 (GOWN DISPOSABLE) IMPLANT
GOWN STRL REUS W/TWL 2XL LVL3 (GOWN DISPOSABLE) ×4 IMPLANT
GOWN STRL REUS W/TWL LRG LVL3 (GOWN DISPOSABLE) ×3
GOWN STRL REUS W/TWL XL LVL3 (GOWN DISPOSABLE) ×3
KIT BASIN OR (CUSTOM PROCEDURE TRAY) ×3 IMPLANT
KIT POSITION SURG JACKSON T1 (MISCELLANEOUS) ×3 IMPLANT
KIT ROOM TURNOVER OR (KITS) ×3 IMPLANT
MILL MEDIUM DISP (BLADE) ×3 IMPLANT
NDL HYPO 25X1 1.5 SAFETY (NEEDLE) ×1 IMPLANT
NDL SPNL 18GX3.5 QUINCKE PK (NEEDLE) IMPLANT
NEEDLE HYPO 25X1 1.5 SAFETY (NEEDLE) ×3 IMPLANT
NEEDLE SPNL 18GX3.5 QUINCKE PK (NEEDLE) ×3 IMPLANT
NS IRRIG 1000ML POUR BTL (IV SOLUTION) ×3 IMPLANT
PACK LAMINECTOMY NEURO (CUSTOM PROCEDURE TRAY) ×3 IMPLANT
PAD ARMBOARD 7.5X6 YLW CONV (MISCELLANEOUS) ×9 IMPLANT
PATTIES SURGICAL .5 X.5 (GAUZE/BANDAGES/DRESSINGS) IMPLANT
PATTIES SURGICAL .5 X1 (DISPOSABLE) IMPLANT
PATTIES SURGICAL 1X1 (DISPOSABLE) IMPLANT
SPONGE GAUZE 4X4 12PLY (GAUZE/BANDAGES/DRESSINGS) ×3 IMPLANT
SPONGE LAP 4X18 X RAY DECT (DISPOSABLE) IMPLANT
SPONGE SURGIFOAM ABS GEL 100 (HEMOSTASIS) ×3 IMPLANT
STAPLER SKIN PROX WIDE 3.9 (STAPLE) IMPLANT
STRIP CLOSURE SKIN 1/2X4 (GAUZE/BANDAGES/DRESSINGS) ×2 IMPLANT
SUT VIC AB 1 CT1 18XBRD ANBCTR (SUTURE) ×2 IMPLANT
SUT VIC AB 1 CT1 8-18 (SUTURE) ×3
SUT VIC AB 2-0 CT1 18 (SUTURE) ×4 IMPLANT
SUT VIC AB 3-0 SH 8-18 (SUTURE) ×6 IMPLANT
SYR 20ML ECCENTRIC (SYRINGE) ×3 IMPLANT
SYR 3ML LL SCALE MARK (SYRINGE) ×2 IMPLANT
SYR 5ML LL (SYRINGE) IMPLANT
TOWEL OR 17X24 6PK STRL BLUE (TOWEL DISPOSABLE) ×3 IMPLANT
TOWEL OR 17X26 10 PK STRL BLUE (TOWEL DISPOSABLE) ×3 IMPLANT
TRAP SPECIMEN MUCOUS 40CC (MISCELLANEOUS) ×1 IMPLANT
TRAY FOLEY CATH 14FRSI W/METER (CATHETERS) ×1 IMPLANT
WATER STERILE IRR 1000ML POUR (IV SOLUTION) ×3 IMPLANT

## 2014-01-26 NOTE — Interval H&P Note (Signed)
History and Physical Interval Note:  01/26/2014 5:36 PM  Carolyn Barton  has presented today for surgery, with the diagnosis of Lumbar Hardware  The various methods of treatment have been discussed with the patient and family. After consideration of risks, benefits and other options for treatment, the patient has consented to  Procedure(s): Revision Lumbar Hardware (N/A) as a surgical intervention .  The patient's history has been reviewed, patient examined, no change in status, stable for surgery.  I have reviewed the patient's chart and labs.  Questions were answered to the patient's satisfaction.     Carolyn Barton  CT scan shows medial breach of left L 5 pedicle.  Plan is to revise this screw with the hope that patient has relief of her radicular pain.

## 2014-01-26 NOTE — Progress Notes (Signed)
Awake, alert, conversant.  Patient states leg pain resolved.  Strength full PF/DF.  Doing well.

## 2014-01-26 NOTE — Anesthesia Procedure Notes (Signed)
Procedure Name: Intubation Date/Time: 01/26/2014 6:02 PM Performed by: Maude Leriche D Pre-anesthesia Checklist: Patient identified, Emergency Drugs available, Suction available, Patient being monitored and Timeout performed Patient Re-evaluated:Patient Re-evaluated prior to inductionOxygen Delivery Method: Circle system utilized Preoxygenation: Pre-oxygenation with 100% oxygen Intubation Type: IV induction Ventilation: Mask ventilation without difficulty Laryngoscope Size: Miller and 2 Grade View: Grade I Tube type: Oral Tube size: 7.0 mm Number of attempts: 1 Airway Equipment and Method: Stylet Placement Confirmation: ETT inserted through vocal cords under direct vision,  positive ETCO2 and breath sounds checked- equal and bilateral Secured at: 21 cm Tube secured with: Tape Dental Injury: Teeth and Oropharynx as per pre-operative assessment

## 2014-01-26 NOTE — Anesthesia Preprocedure Evaluation (Addendum)
Anesthesia Evaluation  Patient identified by MRN, date of birth, ID band Patient awake    Reviewed: Allergy & Precautions, H&P , NPO status , Patient's Chart, lab work & pertinent test results, reviewed documented beta blocker date and time   History of Anesthesia Complications (+) PONV and history of anesthetic complications  Airway Mallampati: II TM Distance: >3 FB Neck ROM: Full    Dental  (+) Teeth Intact, Dental Advisory Given   Pulmonary former smoker,  breath sounds clear to auscultation  Pulmonary exam normal       Cardiovascular negative cardio ROS  Rhythm:Regular Rate:Normal     Neuro/Psych Chronic back pain: narcotics    GI/Hepatic negative GI ROS, Neg liver ROS,   Endo/Other  negative endocrine ROS  Renal/GU negative Renal ROS     Musculoskeletal   Abdominal   Peds  Hematology  (+) Blood dyscrasia (h/o thrombocytosis: plt 707K), ,   Anesthesia Other Findings   Reproductive/Obstetrics                         Anesthesia Physical Anesthesia Plan  ASA: II  Anesthesia Plan: General   Post-op Pain Management:    Induction: Intravenous  Airway Management Planned: Oral ETT  Additional Equipment:   Intra-op Plan:   Post-operative Plan: Extubation in OR  Informed Consent: I have reviewed the patients History and Physical, chart, labs and discussed the procedure including the risks, benefits and alternatives for the proposed anesthesia with the patient or authorized representative who has indicated his/her understanding and acceptance.   Dental advisory given  Plan Discussed with: CRNA and Surgeon  Anesthesia Plan Comments: (Plan routine monitors, GETA)        Anesthesia Quick Evaluation

## 2014-01-26 NOTE — Transfer of Care (Signed)
Immediate Anesthesia Transfer of Care Note  Patient: Carolyn Barton  Procedure(s) Performed: Procedure(s) with comments: Revision Lumbar Hardware (N/A) - Revision Lumbar Hardware  Patient Location: PACU  Anesthesia Type:General  Level of Consciousness: awake and alert   Airway & Oxygen Therapy: Patient Spontanous Breathing and Patient connected to nasal cannula oxygen  Post-op Assessment: Report given to PACU RN and Post -op Vital signs reviewed and stable  Post vital signs: Reviewed and stable  Complications: No apparent anesthesia complications

## 2014-01-26 NOTE — Anesthesia Postprocedure Evaluation (Signed)
Anesthesia Post Note  Patient: Carolyn Barton  Procedure(s) Performed: Procedure(s) (LRB): Revision Lumbar Hardware (N/A)  Anesthesia type: General  Patient location: PACU  Post pain: Pain level controlled and Adequate analgesia  Post assessment: Post-op Vital signs reviewed, Patient's Cardiovascular Status Stable, Respiratory Function Stable, Patent Airway and Pain level controlled  Last Vitals:  Filed Vitals:   01/26/14 1915  BP: 93/49  Pulse: 86  Temp: 36.9 C  Resp: 12    Post vital signs: Reviewed and stable  Level of consciousness: awake, alert  and oriented  Complications: No apparent anesthesia complications

## 2014-01-26 NOTE — Brief Op Note (Signed)
01/26/2014  7:23 PM  PATIENT:  Carolyn Barton  58 y.o. female  PRE-OPERATIVE DIAGNOSIS:  Lumbar Radiculopathy  POST-OPERATIVE DIAGNOSIS:  Lumbar Radiculopathy  PROCEDURE:  Procedure(s) with comments: Revision Lumbar Hardware (N/A) - Revision Lumbar Hardware  SURGEON:  Surgeon(s) and Role:    * Serina Nichter, MD - Primary  PHYSICIAN ASSISTANT:   ASSISTANTS: Poteat, RN   ANESTHESIA:   general  EBL:  Total I/O In: 500 [I.V.:500] Out: 25 [Blood:25]  BLOOD ADMINISTERED:none  DRAINS: none   LOCAL MEDICATIONS USED:  MARCAINE     SPECIMEN:  No Specimen  DISPOSITION OF SPECIMEN:  N/A  COUNTS:  YES  TOURNIQUET:  * No tourniquets in log *  DICTATION: DICTATION: Patient is a 58-year-old woman who recently underwent MAS PLIF procedure at L 5 S 1 level and did well for two weeks, then began to complain of left leg and buttock pain.  Imaging with CT demonstrated a medial breach of the pedicle of the Left L 5 screw and it was elected to take the patient back to surgery for hardware revision.w Procedure:   Following uncomplicated induction of GETA,  patient was turned into a prone position on the Jackson table and  the area of planned incision was marked, prepped with betadine scrub and Duraprep, then draped. Exposure was performed previous surgery and the MAS retractor was placed.A 5.5 x 30 mm cortical Nuvasive screws was removed at L 5 on the left and repositioned.  This was done after undoing the locking caps and removing the rod on the left.   Subsequently,  the screws were locked and torqued. Final Xrays showed well positioned implants and screw fixation.  The wound was irrigated and then closed with 1, 2-0 and 3-0 Vicryl stitches. Sterile occlusive dressing was placed with Dermabond and an occlusive dressing. The patient was then extubated in the operating room and taken to recovery in stable and satisfactory condition having tolerated her operation well. Counts were correct at the  end of the case.  PLAN OF CARE: Admit for overnight observation  PATIENT DISPOSITION:  PACU - hemodynamically stable.   Delay start of Pharmacological VTE agent (>24hrs) due to surgical blood loss or risk of bleeding: yes  

## 2014-01-26 NOTE — Op Note (Signed)
01/26/2014  7:23 PM  PATIENT:  Carolyn Barton  58 y.o. female  PRE-OPERATIVE DIAGNOSIS:  Lumbar Radiculopathy  POST-OPERATIVE DIAGNOSIS:  Lumbar Radiculopathy  PROCEDURE:  Procedure(s) with comments: Revision Lumbar Hardware (N/A) - Revision Lumbar Hardware  SURGEON:  Surgeon(s) and Role:    * [name], MD - Primary  PHYSICIAN ASSISTANT:   ASSISTANTS: Poteat, RN   ANESTHESIA:   general  EBL:  Total I/O In: 500 [I.V.:500] Out: 25 [Blood:25]  BLOOD ADMINISTERED:none  DRAINS: none   LOCAL MEDICATIONS USED:  MARCAINE     SPECIMEN:  No Specimen  DISPOSITION OF SPECIMEN:  N/A  COUNTS:  YES  TOURNIQUET:  * No tourniquets in log *  DICTATION: DICTATION: Patient is a 58 year old woman who recently underwent MAS PLIF procedure at L 5 S 1 level and did well for two weeks, then began to complain of left leg and buttock pain.  Imaging with CT demonstrated a medial breach of the pedicle of the Left L 5 screw and it was elected to take the patient back to surgery for hardware revision.w Procedure:   Following uncomplicated induction of GETA,  patient was turned into a prone position on the [place] table and  the area of planned incision was marked, prepped with betadine scrub and Duraprep, then draped. Exposure was performed previous surgery and the MAS retractor was placed.A 5.5 x 30 mm cortical Nuvasive screws was removed at L 5 on the left and repositioned.  This was done after undoing the locking caps and removing the rod on the left.   Subsequently,  the screws were locked and torqued. Final Xrays showed well positioned implants and screw fixation.  The wound was irrigated and then closed with 1, 2-0 and 3-0 Vicryl stitches. Sterile occlusive dressing was placed with Dermabond and an occlusive dressing. The patient was then extubated in the operating room and taken to recovery in stable and satisfactory condition having tolerated her operation well. Counts were correct at the  end of the case.  PLAN OF CARE: Admit for overnight observation  PATIENT DISPOSITION:  PACU - hemodynamically stable.   Delay start of Pharmacological VTE agent (>24hrs) due to surgical blood loss or risk of bleeding: yes

## 2014-01-26 NOTE — H&P (Signed)
[facility] [place], [place] [address] Phone: [phone]   Patient ID:   [id number] Patient: Carolyn Barton  Date of Birth: 11-13-1955 Visit Type: Office Visit   Date: 01/25/2014 09:30 AM Provider: Marchia Meiers. Vertell Limber MD   This 58 year old female presents for Follow Up of Back pain.  History of Present Illness: 1.  Follow Up of Back pain  01/11/14  L5-S1 MAS PLIF  Pt visits after calling to report sudden onset & subsequent increrase of left buttock pain.  She notes she did very well after surgery for 2 weeks. She recalls no injury.   Medrol Dose pack completed - no help Oxycodone 15mg  q4hrs Valium 5mg  q4-5 hrs   X-ray on [facility]  The patient is planing of extrusion and left buttock pain.  She said she did well for 2 weeks and then had severe pain immediately following which was sudden in onset.  I reviewed her radiographs today and I'm concerned that the left L5 pedicle screw appears to be medial.  This could account for her left leg pain.  I recommend a CT scan be obtained to evaluate screw and hardware positioning and if this is indeed a problem this will need to be revised.      Medical/Surgical/Interim History Reviewed, no change.  Last detailed document date:12/26/2013.   Family History: Reviewed, no changes.  Last detailed document: 12/26/2013.   Social History: Tobacco use reviewed. Reviewed, no changes. Last detailed document date: 12/26/2013.      MEDICATIONS(added, continued or stopped this visit):   Started Medication Directions Instruction Stopped  01/20/2014 diazepam 5 mg tablet take 1 tablet by oral route  every 6 - 8 hours as needed for spasms     gabapentin 300 mg capsule take 1 capsule by oral route 3 times every day    01/20/2014 Medrol (Pak) 4 mg tablets in a dose pack Take as directed  01/25/2014  01/20/2014 oxycodone 15 mg tablet take 1 tablet by oral route  every 4 - 6 hours as needed      ALLERGIES:  Ingredient  Reaction Medication Name Comment  NO KNOWN ALLERGIES     No known allergies.   Vitals Date Temp F BP Pulse Ht In Wt Lb BMI BSA Pain Score  01/25/2014  157/98 120 66 145 23.4  10/10        IMPRESSION Left buttock pain following lumbar decompression and fusion.  Completed Orders (this encounter) Order Details Reason Side Interpretation Result Initial Treatment Date Region  Hypertension education Continue to monitor blood pressure. If remains elevated, contact primary care physician.         Assessment/Plan # Detail Type Description   1. Assessment Lumbar scoliosis (737.30).       2. Assessment Lumbar radiculopathy (724.4).       3. Assessment Lumbar spondylosis (721.3).       4. Assessment Lumbar spinal stenosis (724.02).       5. Assessment Abnormal findings, elevated BP w/o HTN (796.2).         Pain Assessment/Treatment Pain Scale: 10/10. Method: Numeric Pain Intensity Scale. Location: left buttock. Onset: 10/26/2013. Duration: varies. Quality: stabbing. Pain Assessment/Treatment follow-up plan of care: Patient currently taking pain medication as prescribed..  CT scan to assess hardware positioning.  Orders: Diagnostic Procedures: Assessment Procedure  724.4 CT L-Spine W/o Contrast  Instruction(s)/Education: Assessment Instruction  796.2 Hypertension education             Provider:  Marchia Meiers.  Vertell Limber MD  01/25/2014 09:30 AM Dictation edited by: Marchia Meiers. Vertell Limber    CC Providers: [name] MD [address] [place], [place] [address]  ----------------------------------------------------------------------------------------------------------------------------------------------------------------------         Electronically signed by Marchia Meiers. Vertell Limber MD on 01/25/2014 09:45 AM

## 2014-01-27 NOTE — Discharge Summary (Signed)
Physician Discharge Summary  Patient ID: MAKALYNN BERWANGER MRN: [id number] DOB/AGE: [birth year] 58 y.o.  Admit date: 01/26/2014 Discharge date: 01/27/2014  Admission Diagnoses: Lumbar Radiculopathy    Discharge Diagnoses: Lumbar Radiculopathy s/p Revision Lumbar Hardware   Active Problems:   Lumbar radiculopathy   Discharged Condition: good  Hospital Course: Jenesis Martin was admitted for revision of lumbar hardware.  Following uncomplicated surgery, she recovered nicely in Neuro PACU and transferred to 3500 for observation.  She has progressed well.  Consults: None  Significant Diagnostic Studies: radiology: X-Ray: intra-operative  Treatments: surgery: Revision Lumbar Hardware    Discharge Exam: Blood pressure 109/75, pulse 66, temperature 97.9 F (36.6 C), temperature source Oral, resp. rate 18, height 5\' 6"  (1.676 m), weight 67.132 kg (148 lb), SpO2 97.00%. Alert, conversant, smiling. Denies leg pain, buttock pain, noting only lumbar incisional soreness.  Honeycomb drsg intact over Dermabond. No erythema, swelling, or drainage. Good strength BLE.   Disposition: 01-Home or Self Care  Pt has pain meds & muscle relaxers at home. She verbalizes understanding of d/c instructions & agrees to call office to schedule 3-4 week f/u appt.    Future Appointments Provider Department Dept Phone   12/11/2014 2:15 PM [facility] [phone]   12/11/2014 2:45 PM Heath Lark, MD [facility] [phone]       Medication List    ASK your doctor about these medications       cholecalciferol 1000 UNITS tablet  Commonly known as:  VITAMIN D  Take 1,000 Units by mouth daily.     diazepam 5 MG tablet  Commonly known as:  VALIUM  Take 5 mg by mouth every 6 (six) hours as needed for muscle spasms.     gabapentin 300 MG capsule  Commonly known as:  NEURONTIN  Take 300 mg by mouth 3 (three) times daily.      oxyCODONE 15 MG immediate release tablet  Commonly known as:  ROXICODONE  Take 15 mg by mouth every 6 (six) hours as needed for pain.     oxyCODONE-acetaminophen 10-325 MG per tablet  Commonly known as:  PERCOCET  Take 1 tablet by mouth every 4 (four) hours as needed for pain.         SignedVerdis Prime 01/27/2014, 7:58 AM

## 2014-01-27 NOTE — Progress Notes (Signed)
Subjective: Patient reports "I'm a little sore where the surgery was, but my leg doesn't hurt"  Objective: Vital signs in last 24 hours: Temp:  [97.3 F (36.3 C)-98.5 F (36.9 C)] 97.9 F (36.6 C) (05/01 0424) Pulse Rate:  [54-86] 66 (05/01 0424) Resp:  [9-18] 18 (05/01 0424) BP: (88-128)/(49-83) 109/75 mmHg (05/01 0424) SpO2:  [92 %-100 %] 97 % (05/01 0424) Weight:  [67.132 kg (148 lb)] 67.132 kg (148 lb) (04/30 1322)  Intake/Output from previous day: 04/30 0701 - 05/01 0700 In: 1050 [P.O.:50; I.V.:1000] Out: 25 [Blood:25] Intake/Output this shift:    Alert, conversant, smiling. Denies leg pain, buttock pain, noting only lumbar incisional soreness.  Honeycomb drsg intact over Dermabond. No erythema, swelling, or drainage. Good strength BLE.  Lab Results:  Recent Labs  01/26/14 1315  WBC 9.8  HGB 13.2  HCT 41.2  PLT 707*   BMET  Recent Labs  01/26/14 1315  NA 142  K 4.3  CL 103  CO2 24  GLUCOSE 72  BUN 12  CREATININE 0.65  CALCIUM 9.5    Studies/Results: Ct Lumbar Spine Wo Contrast  01/25/2014   CLINICAL DATA:  Back pain radiating to the left buttock. Fusion surgery 2 weeks ago.  EXAM: CT LUMBAR SPINE WITHOUT CONTRAST  TECHNIQUE: Multidetector CT imaging of the lumbar spine was performed without intravenous contrast administration. Multiplanar CT image reconstructions were also generated.  COMPARISON:  Operative radiographs 01/11/2014. Radiographs 01/25/2014. Preoperative myelogram 05/10/2013.  FINDINGS: T12-L1 and L1-2:  Unremarkable.  L2-3: Retrolisthesis of 3 mm. Bulging of the disc. No significant stenosis.  L3-4: Mild bulging of the disc. Mild facet and ligamentous hypertrophy. No significant stenosis.  L4-5: Bulging of the disc. Facet and ligamentous hypertrophy. No significant stenosis.  L5-S1: Previous decompression and fusion surgery. Discectomy with interbody fusion material that is grossly well positioned. The pedicle screw on the right at L5 traverses  the L4-5 facet joint and angles outward, passing through the ventral cortex at the junction of the pedicle and transverse process. The screw on the left traverses the facet joint and breaches the medial cortex of the pedicle. There would be some potential for this to affect the left L5 nerve root. Sacral screws appear well positioned without involvement of the sacral foramina.  IMPRESSION: Decompression and fusion surgery at L5-S1. Right pedicle screw at L5 traverses the facet joint and passes through the ventral cortex at the junction of the pedicle and transverse process. Left pedicle screw at L5 traverses the facet joint and breaches the medial cortex of the pedicle. This would have some potential to affect the left L5 nerve root.   Electronically Signed   By: Nelson Chimes M.D.   On: 01/25/2014 21:16    Assessment/Plan: Improved   LOS: 1 day  Per DrStern, ok to d/c to home. Pt has pain meds & muscle relaxers at home. She verbalizes understanding of d/c instructions & agrees to call office to schedule 3-4 week f/u appt.   Aaron Edelman Enijah Furr 01/27/2014, 7:53 AM

## 2014-01-27 NOTE — Plan of Care (Signed)
Problem: Consults Goal: Diagnosis - Spinal Surgery Outcome: Completed/Met Date Met:  01/27/14 REVISION OF LUMBAR HARDWARE

## 2014-01-27 NOTE — Progress Notes (Signed)
PT Cancellation Note/ Discharge  Patient Details Name: Carolyn Barton MRN: [id number] DOB: 01-17-1956   Cancelled Treatment:    Reason Eval/Treat Not Completed: PT screened, no needs identified, will sign off. Pt familiar with all precautions from prior surgery without current needs or deficits.   Santanna Olenik B Tadan Shill 01/27/2014, 8:39 AM Elwyn Reach, PT [phone]

## 2014-01-27 NOTE — Progress Notes (Signed)
Pt. Alert and oriented,follows simple instructions, denies pain. Incision area without swelling, redness or S/S of infection. Voiding adequate clear yellow urine. Moving all extremities well and vitals stable and documented. Patient discharged home with spouse. Lumbar surgery notes instructions given to patient and family member for home safety and precautions. Pt. and family stated understanding of instructions given. Pain med given to patient prior to discharged.

## 2014-01-27 NOTE — Progress Notes (Signed)
Patient doing better.  Discharge home.

## 2014-01-27 NOTE — Progress Notes (Signed)
OT Cancellation Note  Patient Details Name: Carolyn Barton MRN: [id number] DOB: 21-Sep-1956   Cancelled Treatment:    Reason Eval/Treat Not Completed: OT screened, no needs identified, will sign off.    01/27/2014 Luther Bradley OTR/L Pager [phone] Office [phone]

## 2014-06-13 ENCOUNTER — Ambulatory Visit: Payer: Self-pay | Admitting: Gynecology

## 2014-07-31 ENCOUNTER — Ambulatory Visit: Payer: Self-pay | Admitting: Gynecology

## 2014-12-08 ENCOUNTER — Other Ambulatory Visit: Payer: Self-pay | Admitting: Hematology and Oncology

## 2014-12-08 DIAGNOSIS — D473 Essential (hemorrhagic) thrombocythemia: Secondary | ICD-10-CM

## 2014-12-11 ENCOUNTER — Ambulatory Visit: Payer: 59 | Admitting: Hematology and Oncology

## 2014-12-11 ENCOUNTER — Other Ambulatory Visit: Payer: 59

## 2015-06-07 ENCOUNTER — Encounter: Payer: Self-pay | Admitting: Internal Medicine

## 2015-07-27 ENCOUNTER — Ambulatory Visit (AMBULATORY_SURGERY_CENTER): Payer: Self-pay | Admitting: *Deleted

## 2015-07-27 VITALS — Ht 65.0 in | Wt 139.0 lb

## 2015-07-27 DIAGNOSIS — Z1211 Encounter for screening for malignant neoplasm of colon: Secondary | ICD-10-CM

## 2015-07-27 MED ORDER — SUPREP BOWEL PREP KIT 17.5-3.13-1.6 GM/177ML PO SOLN: 1.0000 | Freq: Once | ORAL | Status: DC

## 2015-07-27 NOTE — Progress Notes (Signed)
Patient denies any allergies to egg or soy products. Patient has history of PONV with anesthesia.  Patient denies oxygen use at home and denies diet medications. Emmi instructions for colonoscopy explained and given to patient.

## 2015-07-27 NOTE — Progress Notes (Signed)
Medication Samples have been provided to the patient.  Drug name: Hinda Kehr: 1  LOT: [id number]  Exp.Date: 05/2017  The patient has been instructed regarding the correct time, dose, and frequency of taking this medication, including desired effects and most common side effects.   Marveen Reeks 12:53 PM 07/27/2015

## 2015-08-07 ENCOUNTER — Encounter: Payer: Self-pay | Admitting: Internal Medicine

## 2015-08-10 ENCOUNTER — Ambulatory Visit (AMBULATORY_SURGERY_CENTER): Payer: Self-pay | Admitting: Internal Medicine

## 2015-08-10 ENCOUNTER — Emergency Department (HOSPITAL_COMMUNITY)
Admission: EM | Admit: 2015-08-10 | Discharge: 2015-08-10 | Disposition: A | Discharge status: Home / Self Care | Payer: BLUE CROSS/BLUE SHIELD | Attending: Emergency Medicine | Admitting: Emergency Medicine

## 2015-08-10 ENCOUNTER — Encounter (HOSPITAL_COMMUNITY): Payer: Self-pay

## 2015-08-10 ENCOUNTER — Encounter: Payer: Self-pay | Admitting: Internal Medicine

## 2015-08-10 VITALS — BP 154/88 | HR 147 | Temp 96.7°F | Resp 17 | Ht 65.0 in | Wt 139.0 lb

## 2015-08-10 DIAGNOSIS — D649 Anemia, unspecified: Secondary | ICD-10-CM | POA: Insufficient documentation

## 2015-08-10 DIAGNOSIS — G8929 Other chronic pain: Secondary | ICD-10-CM | POA: Insufficient documentation

## 2015-08-10 DIAGNOSIS — R002 Palpitations: Secondary | ICD-10-CM | POA: Insufficient documentation

## 2015-08-10 DIAGNOSIS — Z87891 Personal history of nicotine dependence: Secondary | ICD-10-CM | POA: Diagnosis not present

## 2015-08-10 DIAGNOSIS — Z79899 Other long term (current) drug therapy: Secondary | ICD-10-CM | POA: Diagnosis not present

## 2015-08-10 DIAGNOSIS — Z1211 Encounter for screening for malignant neoplasm of colon: Secondary | ICD-10-CM

## 2015-08-10 DIAGNOSIS — R600 Localized edema: Secondary | ICD-10-CM | POA: Diagnosis not present

## 2015-08-10 DIAGNOSIS — R000 Tachycardia, unspecified: Secondary | ICD-10-CM

## 2015-08-10 LAB — CBC WITH DIFFERENTIAL/PLATELET
Basophils Absolute: 0.1 10*3/uL (ref 0.0–0.1)
Basophils Relative: 1 %
EOS PCT: 4 %
Eosinophils Absolute: 0.2 10*3/uL (ref 0.0–0.7)
HCT: 34.8 % — ABNORMAL LOW (ref 36.0–46.0)
HEMOGLOBIN: 10.9 g/dL — AB (ref 12.0–15.0)
LYMPHS ABS: 1.2 10*3/uL (ref 0.7–4.0)
LYMPHS PCT: 24 %
MCH: 23.9 pg — AB (ref 26.0–34.0)
MCHC: 31.3 g/dL (ref 30.0–36.0)
MCV: 76.3 fL — AB (ref 78.0–100.0)
MONOS PCT: 6 %
Monocytes Absolute: 0.3 10*3/uL (ref 0.1–1.0)
Neutro Abs: 3.2 10*3/uL (ref 1.7–7.7)
Neutrophils Relative %: 65 %
PLATELETS: 507 10*3/uL — AB (ref 150–400)
RBC: 4.56 MIL/uL (ref 3.87–5.11)
RDW: 15.2 % (ref 11.5–15.5)
WBC: 5 10*3/uL (ref 4.0–10.5)

## 2015-08-10 LAB — COMPREHENSIVE METABOLIC PANEL
ALK PHOS: 101 U/L (ref 38–126)
ALT: 14 U/L (ref 14–54)
AST: 19 U/L (ref 15–41)
Albumin: 3.4 g/dL — ABNORMAL LOW (ref 3.5–5.0)
Anion gap: 8 (ref 5–15)
BUN: 5 mg/dL — ABNORMAL LOW (ref 6–20)
CO2: 24 mmol/L (ref 22–32)
Calcium: 8.7 mg/dL — ABNORMAL LOW (ref 8.9–10.3)
Chloride: 106 mmol/L (ref 101–111)
Creatinine, Ser: 0.61 mg/dL (ref 0.44–1.00)
Glucose, Bld: 76 mg/dL (ref 65–99)
Potassium: 4.1 mmol/L (ref 3.5–5.1)
Sodium: 138 mmol/L (ref 135–145)
Total Bilirubin: 0.4 mg/dL (ref 0.3–1.2)
Total Protein: 6.3 g/dL — ABNORMAL LOW (ref 6.5–8.1)

## 2015-08-10 LAB — I-STAT TROPONIN, ED: TROPONIN I, POC: 0 ng/mL (ref 0.00–0.08)

## 2015-08-10 LAB — TSH: TSH: 1.815 u[IU]/mL (ref 0.350–4.500)

## 2015-08-10 LAB — D-DIMER, QUANTITATIVE (NOT AT ARMC): D DIMER QUANT: 0.48 ug{FEU}/mL (ref 0.00–0.48)

## 2015-08-10 MED ORDER — METHADONE HCL 10 MG PO TABS
10.0000 mg | ORAL_TABLET | Freq: Once | ORAL | Status: AC
  Administered 2015-08-10: 10 mg via ORAL
  Filled 2015-08-10: qty 1

## 2015-08-10 MED ORDER — SODIUM CHLORIDE 0.9 % IV BOLUS (SEPSIS)
1000.0000 mL | Freq: Once | INTRAVENOUS | Status: AC
  Administered 2015-08-10: 1000 mL via INTRAVENOUS

## 2015-08-10 MED ORDER — SODIUM CHLORIDE 0.9 % IV SOLN: 500.0000 mL | INTRAVENOUS | Status: DC

## 2015-08-10 NOTE — ED Notes (Addendum)
Pt. Coming from [place]. Pt. Was being prepped for colonoscopy when she went into SVT at 150bpm. Nurses at endoscopy center instructed pt. To preform vagal maneuvers. Pt. Converted before EMS arrival to sinus tachycardia at 115bpm. Pt. Only given NS at endoscopy center. Pt. Having no other associated symptoms.

## 2015-08-10 NOTE — Progress Notes (Signed)
Pt arrived in room and when EKG applied pt found to be in SVT at 150BPM, VSS, plan is to cancel case and f/u with cardiology.

## 2015-08-10 NOTE — Discharge Instructions (Signed)
Continue taking your home medications as prescribed. Follow-up with your primary care provider in 3 days. I also recommend having your blood work rechecked by her primary care provider regarding the your hemoglobin level. Return to the emergency department if symptoms worsen or new onset of fever, difficulty breathing, coughing up blood, wheezing, chest pain, numbness, tingling, weakness, leg swelling, syncope.

## 2015-08-10 NOTE — Progress Notes (Signed)
Patient brought back to admitting bay 6 with Dr. Hilarie Fredrickson in attendance. States patient is in new onset SVT. Patient arrived 0848 am, placed on monitor SVT rate 117-119. Denies chest pain or shortness of breath. Does not appear to be in distress. Patient does state that she feels heart beating rapidly, unaware of when it began. Dr. Hilarie Fredrickson consulted with Dr. Aundra Dubin, advised to send patient to [facility] ED for evaluation. Patient and husband notified and in agreement. EMS called at [place], [place], charge nurse [facility] ED notified at [phone]. EMS arrived at 0913, vital signs unchanged, patient departs with EMS at 0918, IV in right hand patent IVF infused, total 800 cc.

## 2015-08-10 NOTE — ED Notes (Signed)
Pt. Given drink with EDP approval.  

## 2015-08-10 NOTE — ED Provider Notes (Signed)
CSN: [id number]     Arrival date & time 08/10/15  0941 History   First MD Initiated Contact with Patient 08/10/15 1002     Chief Complaint  Patient presents with  . Tachycardia     (Consider location/radiation/quality/duration/timing/severity/associated sxs/prior Treatment) HPI Comments: Patient is a 59 year old female with PMH of chronic back pain who presents to the ED with complaint of tachycardia, onset PTA. Patient states she was about to get a [facility] when she started to feel like her heart was racing. She notes when she was placed on the monitor prior to having the procedure done, nurse reported pt was in SVT at a rate of 150. Patient was advised by the nurse at the clinic to perform vagal maneuvers. Patient states she did not feel like her heart rate started to decrease until she was walking from the procedure room to the waiting room. She was given 1/2L IVF at the clinic. Upon arrival of EMS patient was no longer in SVT, reported heart rate by EMS was 115 bpm. Denies fever, chills, headache, cough, SOB, CP, abdominal pain, nausea, vomiting, diaphoresis, lightheadedness, dizziness, numbness, tingling, weakness. Denies prior cardiac history.   Past Medical History  Diagnosis Date  . PONV (postoperative nausea and vomiting)   . Headache(784.0)     not since surgery for spinal leak  . Blood dyscrasia     thrombocytosis not on any meds  . Chronic back pain   . Anemia     History  . Essential thrombocytosis (HCC)     pt. denies this diagnosis   Past Surgical History  Procedure Laterality Date  . Cyst excision  11/11/2012    lumbar cyst remove  . Maximum access (mas)posterior lumbar interbody fusion (plif) 1 level N/A 01/11/2014    Procedure: FOR MAXIMUM ACCESS (MAS) POSTERIOR LUMBAR INTERBODY FUSION Lumbar Five Sacral One;  Surgeon: [name], MD;  Location: [place];  Service: Neurosurgery;  Laterality: N/A;  FOR MAXIMUM ACCESS  (MAS) POSTERIOR LUMBAR INTERBODY FUSION Lumbar Five Sacral One  . Back surgery      x 4 lower back  . Wisdom tooth extraction    . Perm. dental implant      lower back right   Family History  Problem Relation Age of Onset  . Colon cancer Neg Hx   . Colon polyps Neg Hx   . Rectal cancer Neg Hx   . Stomach cancer Neg Hx   . Esophageal cancer Neg Hx    Social History  Substance Use Topics  . Smoking status: Former Smoker -- 0.25 packs/day for 1 years    Types: Cigarettes  . Smokeless tobacco: Never Used  . Alcohol Use: No   OB History    No data available     Review of Systems  Cardiovascular: Positive for palpitations.  All other systems reviewed and are negative.     Allergies  Review of patient's allergies indicates no known allergies.  Home Medications   Prior to Admission medications   Medication Sig Start Date End Date Taking? Authorizing Provider  DULoxetine (CYMBALTA) 60 MG capsule Take 60 mg by mouth daily.   Yes Historical Provider, MD  HYDROmorphone (DILAUDID) 4 MG tablet Take by mouth every 4 (four) hours as needed for severe pain.   Yes Historical Provider, MD  methadone (DOLOPHINE) 10 MG tablet Take 10 mg by mouth every 3 (three) hours.   Yes Historical Provider, MD  vitamin B-12 (  CYANOCOBALAMIN) 1000 MCG tablet Take 1,000 mcg by mouth daily.   Yes Historical Provider, MD   BP 134/88 mmHg  Pulse 93  Temp(Src) 98.1 F (36.7 C) (Oral)  Resp 18  Ht 5\' 6"  (1.676 m)  Wt 139 lb (63.05 kg)  BMI 22.45 kg/m2  SpO2 100% Physical Exam  Constitutional: She is oriented to person, place, and time. She appears well-developed and well-nourished.  HENT:  Head: Normocephalic and atraumatic.  Mouth/Throat: Oropharynx is clear and moist. No oropharyngeal exudate.  Eyes: Conjunctivae and EOM are normal. Right eye exhibits no discharge. Left eye exhibits no discharge. No scleral icterus.  Neck: Normal range of motion. Neck supple.  Cardiovascular: Regular rhythm,  normal heart sounds and intact distal pulses.   Tachycardic (106 bpm)  Pulmonary/Chest: Effort normal and breath sounds normal. No respiratory distress. She has no wheezes. She has no rales. She exhibits no tenderness.  Abdominal: Soft. Bowel sounds are normal. She exhibits no distension and no mass. There is no tenderness. There is no rebound and no guarding.  Musculoskeletal: Normal range of motion. She exhibits edema (mild nonpitting edema noted in BLE). She exhibits no tenderness.  Lymphadenopathy:    She has no cervical adenopathy.  Neurological: She is alert and oriented to person, place, and time.  Skin: Skin is warm and dry. She is not diaphoretic.  Nursing note and vitals reviewed.   ED Course  Procedures (including critical care time) Labs Review Labs Reviewed  CBC WITH DIFFERENTIAL/PLATELET - Abnormal; Notable for the following:    Hemoglobin 10.9 (*)    HCT 34.8 (*)    MCV 76.3 (*)    MCH 23.9 (*)    Platelets 507 (*)    All other components within normal limits  COMPREHENSIVE METABOLIC PANEL - Abnormal; Notable for the following:    BUN 5 (*)    Calcium 8.7 (*)    Total Protein 6.3 (*)    Albumin 3.4 (*)    All other components within normal limits  TSH  D-DIMER, QUANTITATIVE (NOT AT [facility])  I-STAT TROPOININ, ED    Imaging Review No results found. I have personally reviewed and evaluated these images and lab results as part of my medical decision-making.   EKG Interpretation   Date/Time:  Friday August 10 2015 09:43:46 EST Ventricular Rate:  108 PR Interval:  161 QRS Duration: 89 QT Interval:  341 QTC Calculation: 457 R Axis:   11 Text Interpretation:  Sinus tachycardia Probable left atrial enlargement  Abnormal R-wave progression, early transition Nonspecific T abnormalities,  anterior leads No significant change since last tracing Confirmed by YAO   MD, DAVID ([id number]) on 08/10/2015 10:09:58 AM     Filed Vitals:   08/10/15 1500  BP: 134/88   Pulse: 93  Temp:   Resp: 18     MDM   Final diagnoses:  Tachycardia    Pt presents with tachycardia and reported SVT that occurred at [facility] prior to having a routine colonoscopy. Patient reports no resolution of SVT with vagal maneuvers, she notes that she fell her palpitations improved while she was walking from the procedure room to the waiting room at the clinic. She was started on IV fluids at the clinic. Denies any chest pain or shortness of breath. Denies any prior cardiac history. HR 106 bpm. exam unremarkable. EKG revealed sinus tachycardia. Hgb 10.9. Troponin negative. Patient given 1 L IVF in the ED.  Patient reports she was having a routine colonoscopy. Denies hematochezia  or rectal bleeding. She notes when she was seen in her PCP office in September her hemoglobin was low (value unknown). She was prescribed iron however she states that she was unable to take them do to side effects. Patient also reports taking methadone and Dilaudid daily for chronic pain. She notes she takes 10 mg of methadone 3 times a day. Patient states she took her dose of methadone this morning. She also reports being nothing by mouth for colonoscopy procedure since last night. Pt given her home dose of methadone in the ED.   D-dimer 0.48. TSH 1.815. Orthostatics negative. Pt's vitals rechecked s/p IVF and methadone. HR in the 80s and 90s. No SVT present during ED visit. Pt continues to not have any CP, SOB or palpitations. I suspect pt's tachycardia is likely due to dehydration associated with being NPO for colonoscopy and possible methadone withdrawal. Discussed results and plan for discharge with patient. Patient advised to follow-up with her PCP on Monday for follow up regarding tachycardia and dec. hemoglobin.  Evaluation does not show pathology requring ongoing emergent intervention or admission. Pt is hemodynamically stable and mentating appropriately. Discussed findings/results and plan  with patient/guardian, who agrees with plan. All questions answered. Return precautions discussed and outpatient follow up given.    Chesley Noon [place], [place] 08/10/15 [place] Yao, MD 08/11/15 [phone]

## 2015-08-13 ENCOUNTER — Telehealth: Payer: Self-pay | Admitting: *Deleted

## 2015-08-13 NOTE — Telephone Encounter (Signed)
  Follow up Call-  Call back number 08/10/2015  Post procedure Call Back phone  # [phone]  Permission to leave phone message Yes     Patient questions:  Do you have a fever, pain , or abdominal swelling? No.  Patient is home after ED room on Friday. States she is "fine." Pain Score  0 *  Have you tolerated food without any problems? Yes.    Have you been able to return to your normal activities? Yes.    Do you have any questions about your discharge instructions: Diet   No. Medications  No. Follow up visit  No.  Do you have questions or concerns about your Care? No.  Actions: * If pain score is 4 or above: No action needed, pain <4.

## 2015-08-14 ENCOUNTER — Telehealth: Payer: Self-pay | Admitting: Internal Medicine

## 2015-08-14 NOTE — Telephone Encounter (Signed)
Contacted patient to check on her. No answer I left a message She came for colonoscopy last week which was canceled when she was noted to have supraventricular tachycardia I asked that she call me back to let me know how she is doing and we can hopefully facilitate rescheduling colonoscopy

## 2015-08-15 NOTE — Telephone Encounter (Signed)
Patient returned phone call. °

## 2015-08-15 NOTE — Telephone Encounter (Signed)
I have spoken to patient who states that she is doing well. She went to the ER shortly after leaving our office with SVT. ER was unable to find any cause and PCP was unable to find any abnormality. Patient is scheduled to see Dr Harrington Challenger on 09/03/15 and states that she will call back when she is ready to reschedule her procedure.

## 2015-08-27 ENCOUNTER — Other Ambulatory Visit (HOSPITAL_COMMUNITY): Payer: Self-pay | Admitting: *Deleted

## 2015-08-28 ENCOUNTER — Encounter (HOSPITAL_COMMUNITY)
Admission: RE | Admit: 2015-08-28 | Discharge: 2015-08-28 | Disposition: A | Discharge status: Home / Self Care | Payer: BLUE CROSS/BLUE SHIELD | Source: Ambulatory Visit | Attending: Internal Medicine | Admitting: Internal Medicine

## 2015-08-28 DIAGNOSIS — D649 Anemia, unspecified: Secondary | ICD-10-CM | POA: Insufficient documentation

## 2015-08-28 MED ORDER — SODIUM CHLORIDE 0.9 % IV SOLN
510.0000 mg | Freq: Once | INTRAVENOUS | Status: AC
  Administered 2015-08-28: 510 mg via INTRAVENOUS
  Filled 2015-08-28: qty 17

## 2015-09-03 ENCOUNTER — Encounter: Payer: Self-pay | Admitting: Internal Medicine

## 2015-09-03 ENCOUNTER — Ambulatory Visit (INDEPENDENT_AMBULATORY_CARE_PROVIDER_SITE_OTHER): Payer: BLUE CROSS/BLUE SHIELD | Admitting: Internal Medicine

## 2015-09-03 VITALS — BP 120/62 | HR 68 | Ht 65.0 in | Wt 137.8 lb

## 2015-09-03 DIAGNOSIS — R609 Edema, unspecified: Secondary | ICD-10-CM | POA: Diagnosis not present

## 2015-09-03 NOTE — Patient Instructions (Signed)

## 2015-09-03 NOTE — Progress Notes (Signed)
Cardiology Office Note   Date:  09/03/2015   ID:  Carolyn Barton, DOB Feb 04, 1956, MRN [id number]  PCP:  Marton Redwood, MD  Cardiologist:   Dorris Carnes, MD   F/U of SVT  Eval of edema    History of Present Illness: Carolyn Barton is a 59 y.o. female who is referred for SVT  The pt was sched to undergo colonoscopy in November.  Was at endoscope center, about to start procedure when HR increased to 150s   Per Dr Garth Schlatter note rate wasd 117-119 Pt instructed to bear down (vagal maneuver)  Sent to [facility]  Procedure cancelled  By time she got to ER she was in ST Pt sensed heart racing at time  Denied dizziness  No CP   Since then she denies further palpitations. She is active  [employer].  No problems  Did notice the development of LE edema over past 1 month  Denied change in diet  No injury       Current Outpatient Prescriptions  Medication Sig Dispense Refill  . DULoxetine (CYMBALTA) 60 MG capsule Take 60 mg by mouth daily.    . furosemide (LASIX) 20 MG tablet Take 20 mg by mouth daily.   5  . HYDROmorphone (DILAUDID) 4 MG tablet Take by mouth every 4 (four) hours as needed for severe pain.    . methadone (DOLOPHINE) 10 MG tablet Take 10 mg by mouth every 3 (three) hours.    . vitamin B-12 (CYANOCOBALAMIN) 1000 MCG tablet Take 1,000 mcg by mouth daily.    . Vitamin D, Ergocalciferol, (DRISDOL) 50000 UNITS CAPS capsule Take 50,000 Units by mouth every 7 (seven) days.   10   No current facility-administered medications for this visit.    Allergies:   Review of patient's allergies indicates no known allergies.   Past Medical History  Diagnosis Date  . PONV (postoperative nausea and vomiting)   . Headache(784.0)     not since surgery for spinal leak  . Blood dyscrasia     thrombocytosis not on any meds  . Chronic back pain   . Anemia     History  . Essential thrombocytosis (HCC)     pt. denies this diagnosis    Past Surgical History  Procedure Laterality Date    . Cyst excision  11/11/2012    lumbar cyst remove  . Maximum access (mas)posterior lumbar interbody fusion (plif) 1 level N/A 01/11/2014    Procedure: FOR MAXIMUM ACCESS (MAS) POSTERIOR LUMBAR INTERBODY FUSION Lumbar Five Sacral One;  Surgeon: [name], MD;  Location: [place] [facility];  Service: Neurosurgery;  Laterality: N/A;  FOR MAXIMUM ACCESS (MAS) POSTERIOR LUMBAR INTERBODY FUSION Lumbar Five Sacral One  . Back surgery      x 4 lower back  . Wisdom tooth extraction    . Perm. dental implant      lower back right     Social History:  The patient  reports that she has quit smoking. Her smoking use included Cigarettes. She has a .25 pack-year smoking history. She has never used smokeless tobacco. She reports that she does not drink alcohol or use illicit drugs.   Family History:  The patient's family history includes Heart disease in her mother; Melanoma in her mother. There is no history of Colon cancer, Colon polyps, Rectal cancer, Stomach cancer, or Esophageal cancer.    ROS:  Please see the history of present illness. All other systems are reviewed and  Negative  to the above problem except as noted.    PHYSICAL EXAM: VS:  BP 120/62 mmHg  Pulse 68  Ht 5\' 5"  (1.651 m)  Wt 62.506 kg (137 lb 12.8 oz)  BMI 22.93 kg/m2  GEN: Well nourished, well developed, in no acute distress HEENT: normal Neck: JVP is increased, carotid bruits, or masses Cardiac: RRR; no murmurs, rubs, or gallops, 1+ edema  Respiratory:  clear to auscultation bilaterally, normal work of breathing GI: soft, nontender, nondistended, + BS  No hepatomegaly  MS: no deformity Moving all extremities   Skin: warm and dry, no rash Neuro:  Strength and sensation are intact Psych: euthymic mood, full affect   EKG:  EKG is not ordered today. Previously ST   Lipid Panel No results found for: CHOL, TRIG, HDL, CHOLHDL, VLDL, LDLCALC, LDLDIRECT    Wt Readings from Last 3 Encounters:  09/03/15 62.506 kg (137 lb 12.8  oz)  08/10/15 63.05 kg (139 lb)  08/10/15 63.05 kg (139 lb)      ASSESSMENT AND PLAN:  1.  Tachycardia  Reported SVT on 11/11  I cannot find that strips were saved  Very transient  By time she got to ER she was in [place]. Recomm I would follow  First episode in her life, if indeed it occurred ans was not sinus tach .  Avoid overdoing stimulants Rx for metoprolol given to slow heart rate  If recurs will need to determine further Rx.  2.  Edema  Edema in feet/calves  Neck veins are sl increased I would recomm an echo to evaluate systolic/diastolic dysfunction. She has Rx for lasix  It is OK to take 1 periodically  Will contact pt with test results     F/U based on test results    Signed, Dorris Carnes, MD  09/03/2015 11:14 AM    [facility] [address], [place], [place]  [address] Phone: [phone]; Fax: [phone]

## 2015-09-17 ENCOUNTER — Other Ambulatory Visit: Payer: Self-pay

## 2015-09-17 ENCOUNTER — Ambulatory Visit (HOSPITAL_COMMUNITY): Payer: BLUE CROSS/BLUE SHIELD | Attending: Cardiovascular Disease

## 2015-09-17 DIAGNOSIS — I313 Pericardial effusion (noninflammatory): Secondary | ICD-10-CM | POA: Insufficient documentation

## 2015-09-17 DIAGNOSIS — R609 Edema, unspecified: Secondary | ICD-10-CM | POA: Diagnosis not present

## 2015-09-17 DIAGNOSIS — I517 Cardiomegaly: Secondary | ICD-10-CM | POA: Diagnosis not present

## 2015-09-17 DIAGNOSIS — Z87891 Personal history of nicotine dependence: Secondary | ICD-10-CM | POA: Diagnosis not present

## 2015-09-17 DIAGNOSIS — I071 Rheumatic tricuspid insufficiency: Secondary | ICD-10-CM | POA: Diagnosis not present

## 2015-09-20 ENCOUNTER — Encounter: Payer: Self-pay | Admitting: Internal Medicine

## 2015-09-27 ENCOUNTER — Telehealth: Payer: Self-pay | Admitting: *Deleted

## 2015-09-27 NOTE — Telephone Encounter (Signed)
Left another voicemail for patient to call back.   ===View-only below this line===  ----- Message -----    From: Larina Bras, CMA    Sent: 09/27/2015   8:23 AM      To: Larina Bras, CMA  ===View-only below this line=== ----- Message -----    From: Larina Bras, CMA    Sent: 09/26/2015      To: Larina Bras, CMA  Left voicemail for patient to call back.  ----- Message -----    From: Jerene Bears, MD    Sent: 09/25/2015   4:37 PM      To: Larina Bras, CMA  We need to reschedule colonoscopy JMP  ----- Message -----    From: Fay Records, MD    Sent: 09/25/2015   4:27 PM      To: Jerene Bears, MD  I saw pt in clinic. Attached is note.  Echo with vigorous LV function  Would follow for recurrent SVT  Taking lasix for some LE edema OK to proceed with work up for anemia with colonoscopy. If neg, with edema, would you recomm a abdominal scan?

## 2015-10-02 NOTE — Telephone Encounter (Signed)
Left message for patient to call back. If she does not return my call this time, a letter will be sent to her home address for her to contact us regarding rescheduling colonoscopy.

## 2015-10-03 NOTE — Telephone Encounter (Signed)
Patient has scheduled a colonoscopy on 12/20/15 and for previsit prior to that.

## 2015-11-02 ENCOUNTER — Other Ambulatory Visit (HOSPITAL_COMMUNITY): Payer: Self-pay | Admitting: *Deleted

## 2015-11-05 ENCOUNTER — Encounter (HOSPITAL_COMMUNITY)
Admission: RE | Admit: 2015-11-05 | Discharge: 2015-11-05 | Disposition: A | Discharge status: Home / Self Care | Payer: BLUE CROSS/BLUE SHIELD | Source: Ambulatory Visit | Attending: Internal Medicine | Admitting: Internal Medicine

## 2015-11-05 DIAGNOSIS — D649 Anemia, unspecified: Secondary | ICD-10-CM | POA: Insufficient documentation

## 2015-11-05 MED ORDER — SODIUM CHLORIDE 0.9 % IV SOLN
510.0000 mg | INTRAVENOUS | Status: DC
  Administered 2015-11-05: 510 mg via INTRAVENOUS
  Filled 2015-11-05: qty 17

## 2015-11-12 ENCOUNTER — Ambulatory Visit (HOSPITAL_COMMUNITY)
Admission: RE | Admit: 2015-11-12 | Discharge: 2015-11-12 | Disposition: A | Discharge status: Home / Self Care | Payer: BLUE CROSS/BLUE SHIELD | Source: Ambulatory Visit | Attending: Internal Medicine | Admitting: Internal Medicine

## 2015-11-12 DIAGNOSIS — D649 Anemia, unspecified: Secondary | ICD-10-CM | POA: Diagnosis present

## 2015-11-12 MED ORDER — FERUMOXYTOL INJECTION 510 MG/17 ML
510.0000 mg | INTRAVENOUS | Status: AC
  Administered 2015-11-12: 510 mg via INTRAVENOUS
  Filled 2015-11-12: qty 17

## 2015-11-29 ENCOUNTER — Ambulatory Visit (AMBULATORY_SURGERY_CENTER): Payer: Self-pay | Admitting: *Deleted

## 2015-11-29 VITALS — Ht 66.0 in | Wt 138.0 lb

## 2015-11-29 DIAGNOSIS — Z1211 Encounter for screening for malignant neoplasm of colon: Secondary | ICD-10-CM

## 2015-11-29 MED ORDER — NA SULFATE-K SULFATE-MG SULF 17.5-3.13-1.6 GM/177ML PO SOLN: 1.0000 | Freq: Once | ORAL | Status: DC

## 2015-11-29 NOTE — Progress Notes (Signed)
No egg or soy allergy. No anesthesia problems.  No home O2.  No diet meds.  

## 2015-12-20 ENCOUNTER — Telehealth: Payer: Self-pay | Admitting: *Deleted

## 2015-12-20 ENCOUNTER — Ambulatory Visit: Payer: BLUE CROSS/BLUE SHIELD | Admitting: Internal Medicine

## 2015-12-20 NOTE — Progress Notes (Signed)
Left message for Carolyn Barton to reschedule the patient later on this morning for a later date.  Please include nausea medication in pre-vist.  Dr. Hilarie Fredrickson is aware of the situation.

## 2015-12-20 NOTE — Telephone Encounter (Signed)
Patient still vomited the whole prep after speaking with Dr. Hilarie Fredrickson.  We decided to reschedule.  I told her to go back to bed, and that we would call her later to reschedule.  She would like some nausea meds with the next prep if at all possible. She hadn't had a BM yet at all.

## 2015-12-28 ENCOUNTER — Telehealth: Payer: Self-pay | Admitting: *Deleted

## 2015-12-28 NOTE — Telephone Encounter (Signed)
Dr Hilarie Fredrickson has requested that patient complete a cologuard since patient has been unable to go forward with colonoscopy due to vomiting prep. I have left a voicemail for patient to call back.

## 2016-01-01 NOTE — Telephone Encounter (Signed)
Patient states that she is willing to have cologuard. I have sent demographic information to [employer] for the test and have asked that patient contact us if she does not hear from them within 1 week.

## 2016-01-01 NOTE — Telephone Encounter (Signed)
I have attempted to reach out to patient again. I have left another voicemail for her to call back.

## 2016-01-18 ENCOUNTER — Telehealth: Payer: Self-pay | Admitting: *Deleted

## 2016-01-18 NOTE — Telephone Encounter (Signed)
Dr Hilarie Fredrickson has received lab results from Dr Jacquelynn Cree office. He states, "labs reviewed. Patient has IDA. This is concerning for possible GI blood loss. She has attempted colonoscopy on 2 occasions which failed 1) SVT in pre-op 2) intolerant of prep (N/V). Cologuard requested. Should be returned, ASAP, though best test is colonoscopy. Please contact patient re: cologuard." I have spoken to patient to advise her of Dr Vena Rua recommendations. She verbalizes understanding and states that she will complete her cologuard this upcoming week.

## 2016-02-29 ENCOUNTER — Inpatient Hospital Stay (HOSPITAL_COMMUNITY)
Admission: EM | Admit: 2016-02-29 | Discharge: 2016-03-15 | DRG: 329 | Disposition: A | Discharge status: Home Health | Payer: BLUE CROSS/BLUE SHIELD | Attending: Family Medicine | Admitting: Family Medicine

## 2016-02-29 ENCOUNTER — Emergency Department (HOSPITAL_COMMUNITY): Payer: BLUE CROSS/BLUE SHIELD

## 2016-02-29 ENCOUNTER — Encounter (HOSPITAL_COMMUNITY): Payer: Self-pay | Admitting: *Deleted

## 2016-02-29 DIAGNOSIS — K6389 Other specified diseases of intestine: Secondary | ICD-10-CM | POA: Diagnosis not present

## 2016-02-29 DIAGNOSIS — J90 Pleural effusion, not elsewhere classified: Secondary | ICD-10-CM | POA: Diagnosis not present

## 2016-02-29 DIAGNOSIS — R160 Hepatomegaly, not elsewhere classified: Secondary | ICD-10-CM | POA: Diagnosis present

## 2016-02-29 DIAGNOSIS — G8929 Other chronic pain: Secondary | ICD-10-CM | POA: Diagnosis present

## 2016-02-29 DIAGNOSIS — C7919 Secondary malignant neoplasm of other urinary organs: Secondary | ICD-10-CM | POA: Diagnosis present

## 2016-02-29 DIAGNOSIS — Z7982 Long term (current) use of aspirin: Secondary | ICD-10-CM

## 2016-02-29 DIAGNOSIS — C19 Malignant neoplasm of rectosigmoid junction: Secondary | ICD-10-CM | POA: Diagnosis not present

## 2016-02-29 DIAGNOSIS — A419 Sepsis, unspecified organism: Secondary | ICD-10-CM | POA: Diagnosis not present

## 2016-02-29 DIAGNOSIS — D649 Anemia, unspecified: Secondary | ICD-10-CM | POA: Diagnosis present

## 2016-02-29 DIAGNOSIS — R109 Unspecified abdominal pain: Secondary | ICD-10-CM | POA: Diagnosis present

## 2016-02-29 DIAGNOSIS — K56609 Unspecified intestinal obstruction, unspecified as to partial versus complete obstruction: Secondary | ICD-10-CM | POA: Diagnosis present

## 2016-02-29 DIAGNOSIS — C786 Secondary malignant neoplasm of retroperitoneum and peritoneum: Secondary | ICD-10-CM | POA: Diagnosis present

## 2016-02-29 DIAGNOSIS — M5416 Radiculopathy, lumbar region: Secondary | ICD-10-CM | POA: Diagnosis present

## 2016-02-29 DIAGNOSIS — E860 Dehydration: Secondary | ICD-10-CM | POA: Diagnosis present

## 2016-02-29 DIAGNOSIS — R112 Nausea with vomiting, unspecified: Secondary | ICD-10-CM

## 2016-02-29 DIAGNOSIS — D72829 Elevated white blood cell count, unspecified: Secondary | ICD-10-CM

## 2016-02-29 DIAGNOSIS — Z981 Arthrodesis status: Secondary | ICD-10-CM

## 2016-02-29 DIAGNOSIS — Z79891 Long term (current) use of opiate analgesic: Secondary | ICD-10-CM

## 2016-02-29 DIAGNOSIS — F329 Major depressive disorder, single episode, unspecified: Secondary | ICD-10-CM | POA: Diagnosis present

## 2016-02-29 DIAGNOSIS — Z87891 Personal history of nicotine dependence: Secondary | ICD-10-CM

## 2016-02-29 DIAGNOSIS — E440 Moderate protein-calorie malnutrition: Secondary | ICD-10-CM | POA: Diagnosis present

## 2016-02-29 DIAGNOSIS — E876 Hypokalemia: Secondary | ICD-10-CM | POA: Diagnosis present

## 2016-02-29 DIAGNOSIS — L0390 Cellulitis, unspecified: Secondary | ICD-10-CM | POA: Diagnosis not present

## 2016-02-29 DIAGNOSIS — K5669 Other intestinal obstruction: Secondary | ICD-10-CM | POA: Diagnosis not present

## 2016-02-29 DIAGNOSIS — S30821A Blister (nonthermal) of abdominal wall, initial encounter: Secondary | ICD-10-CM

## 2016-02-29 DIAGNOSIS — L089 Local infection of the skin and subcutaneous tissue, unspecified: Secondary | ICD-10-CM

## 2016-02-29 DIAGNOSIS — F32A Depression, unspecified: Secondary | ICD-10-CM | POA: Diagnosis present

## 2016-02-29 DIAGNOSIS — L03311 Cellulitis of abdominal wall: Secondary | ICD-10-CM | POA: Diagnosis not present

## 2016-02-29 DIAGNOSIS — D473 Essential (hemorrhagic) thrombocythemia: Secondary | ICD-10-CM | POA: Diagnosis present

## 2016-02-29 DIAGNOSIS — C787 Secondary malignant neoplasm of liver and intrahepatic bile duct: Secondary | ICD-10-CM | POA: Diagnosis present

## 2016-02-29 DIAGNOSIS — K5660 Unspecified intestinal obstruction: Secondary | ICD-10-CM | POA: Diagnosis present

## 2016-02-29 DIAGNOSIS — N135 Crossing vessel and stricture of ureter without hydronephrosis: Secondary | ICD-10-CM | POA: Diagnosis present

## 2016-02-29 DIAGNOSIS — M419 Scoliosis, unspecified: Secondary | ICD-10-CM | POA: Diagnosis present

## 2016-02-29 DIAGNOSIS — R188 Other ascites: Secondary | ICD-10-CM | POA: Diagnosis present

## 2016-02-29 DIAGNOSIS — K56699 Other intestinal obstruction unspecified as to partial versus complete obstruction: Secondary | ICD-10-CM

## 2016-02-29 DIAGNOSIS — E871 Hypo-osmolality and hyponatremia: Secondary | ICD-10-CM | POA: Diagnosis present

## 2016-02-29 LAB — CBC
HEMATOCRIT: 39.4 % (ref 36.0–46.0)
HEMOGLOBIN: 12.4 g/dL (ref 12.0–15.0)
MCH: 22.7 pg — ABNORMAL LOW (ref 26.0–34.0)
MCHC: 31.5 g/dL (ref 30.0–36.0)
MCV: 72.2 fL — ABNORMAL LOW (ref 78.0–100.0)
Platelets: 954 10*3/uL (ref 150–400)
RBC: 5.46 MIL/uL — AB (ref 3.87–5.11)
RDW: 16 % — ABNORMAL HIGH (ref 11.5–15.5)
WBC: 14 10*3/uL — AB (ref 4.0–10.5)

## 2016-02-29 LAB — COMPREHENSIVE METABOLIC PANEL
ALBUMIN: 3.7 g/dL (ref 3.5–5.0)
ALT: 23 U/L (ref 14–54)
ANION GAP: 12 (ref 5–15)
AST: 28 U/L (ref 15–41)
Alkaline Phosphatase: 88 U/L (ref 38–126)
BILIRUBIN TOTAL: 0.4 mg/dL (ref 0.3–1.2)
BUN: 12 mg/dL (ref 6–20)
CO2: 23 mmol/L (ref 22–32)
Calcium: 9.5 mg/dL (ref 8.9–10.3)
Chloride: 98 mmol/L — ABNORMAL LOW (ref 101–111)
Creatinine, Ser: 0.57 mg/dL (ref 0.44–1.00)
GFR calc non Af Amer: >60 mL/min (ref >60)
GLUCOSE: 127 mg/dL — AB (ref 65–99)
POTASSIUM: 3.7 mmol/L (ref 3.5–5.1)
Sodium: 133 mmol/L — ABNORMAL LOW (ref 135–145)
TOTAL PROTEIN: 6.9 g/dL (ref 6.5–8.1)

## 2016-02-29 LAB — LIPASE, BLOOD: Lipase: 17 U/L (ref 11–51)

## 2016-02-29 MED ORDER — SODIUM CHLORIDE 0.9 % IV SOLN
Freq: Once | INTRAVENOUS | Status: AC
  Administered 2016-03-01: 05:00:00 via INTRAVENOUS

## 2016-02-29 MED ORDER — ONDANSETRON HCL 4 MG/2ML IJ SOLN
4.0000 mg | Freq: Three times a day (TID) | INTRAMUSCULAR | Status: DC | PRN
  Administered 2016-03-03: 4 mg via INTRAVENOUS

## 2016-02-29 MED ORDER — SODIUM CHLORIDE 0.9 % IV SOLN
Freq: Once | INTRAVENOUS | Status: AC
  Administered 2016-02-29: 18:00:00 via INTRAVENOUS

## 2016-02-29 MED ORDER — ACETAMINOPHEN 325 MG PO TABS
650.0000 mg | ORAL_TABLET | Freq: Four times a day (QID) | ORAL | Status: DC | PRN
  Administered 2016-03-11: 650 mg via ORAL
  Filled 2016-02-29: qty 2

## 2016-02-29 MED ORDER — LIDOCAINE HCL 2 % EX GEL
1.0000 "application " | Freq: Once | CUTANEOUS | Status: AC
  Administered 2016-02-29: 1 via TOPICAL
  Filled 2016-02-29: qty 20

## 2016-02-29 MED ORDER — IOPAMIDOL (ISOVUE-300) INJECTION 61%
INTRAVENOUS | Status: AC
  Administered 2016-02-29: 100 mL
  Filled 2016-02-29: qty 100

## 2016-02-29 MED ORDER — SODIUM CHLORIDE 0.9 % IV BOLUS (SEPSIS)
1000.0000 mL | Freq: Once | INTRAVENOUS | Status: AC
  Administered 2016-02-29: 1000 mL via INTRAVENOUS

## 2016-02-29 MED ORDER — HYDROMORPHONE HCL 1 MG/ML IJ SOLN
1.0000 mg | INTRAMUSCULAR | Status: DC | PRN
Start: 2016-02-29 — End: 2016-03-07
  Administered 2016-03-01 – 2016-03-03 (×10): 1 mg via INTRAVENOUS
  Administered 2016-03-03 (×2): 0.5 mg via INTRAVENOUS
  Administered 2016-03-03: 1 mg via INTRAVENOUS
  Filled 2016-02-29 (×12): qty 1

## 2016-02-29 MED ORDER — FENTANYL CITRATE (PF) 100 MCG/2ML IJ SOLN
50.0000 ug | INTRAMUSCULAR | Status: DC | PRN
  Administered 2016-02-29: 50 ug via INTRAVENOUS
  Filled 2016-02-29: qty 2

## 2016-02-29 MED ORDER — SODIUM CHLORIDE 0.9% FLUSH
3.0000 mL | Freq: Two times a day (BID) | INTRAVENOUS | Status: DC
  Administered 2016-03-01 – 2016-03-14 (×18): 3 mL via INTRAVENOUS

## 2016-02-29 MED ORDER — MIDAZOLAM HCL 2 MG/2ML IJ SOLN
2.0000 mg | Freq: Once | INTRAMUSCULAR | Status: AC
  Administered 2016-02-29: 2 mg via INTRAVENOUS
  Filled 2016-02-29: qty 2

## 2016-02-29 MED ORDER — ACETAMINOPHEN 650 MG RE SUPP: 650.0000 mg | Freq: Four times a day (QID) | RECTAL | Status: DC | PRN

## 2016-02-29 NOTE — H&P (Signed)
History and Physical    Carolyn Barton:[id number] DOB: [birth year] DOA: 02/29/2016  Referring MD/NP/PA:   PCP: Marton Redwood, MD   Patient coming from:  The patient is coming from home.  At baseline, pt is independent for most of ADL.    Chief Complaint: Nausea, vomiting, abdominal pain, constipation  HPI: Carolyn Barton is a 60 y.o. female with medical history significant of chronic back pain, thrombocytosis, depression, tachycardia, who presents with nausea, vomiting, abdominal pain and constipation.  Patient reports that she has been having severe constipation, nausea, vomiting and abdominal pain for about one week. Her last bowel movement was a week ago. She has vomited 1 to 2 time each day without blood in the vomitus. She has a diffused abdominal pain, intermittent, 5 out of 10 in severity, nonradiating. It is not aggravated or alleviated by any fractures. The patient denies chest pain, shortness of breath, cough, symptoms of UTI or unilateral weakness. She was given prescription of Movantik by her PCP without help.  ED Course: pt was found to have WBC 14.0, lipase is 17, temperature normal, tachycardia, renal function okay. X-ray showed possible bowel obstruction. Pt is place on tele bed for obs.  # CT abdomen/pelvis showed: 1. colonic mass noted at the distal sigmoid colon, measuring 6.6 x 3.2 x 3.8 cm, resulting in partial obstruction, compatible with primary colonic malignancy, 2. mall adjacent nodules seen, concerning for local spread of disease. No pelvic sidewall lymphadenopathy seen. 3. Small volume ascites within the abdomen and pelvis. 4. Nonspecific hypodensities within the liver, measuring up to 2.2 cm in size. Metastatic disease cannot be excluded. 5. Moderate hiatal hernia seen. 7. 1.7 cm nodule posterior to the uterus may simply reflect an exophytic fibroid.  Review of Systems:   General: no fevers, chills, no changes in body weight, has poor appetite, has  fatigue HEENT: no blurry vision, hearing changes or sore throat Pulm: no dyspnea, coughing, wheezing CV: no chest pain, no palpitations Abd: has nausea, vomiting, abdominal pain, no diarrhea, has constipation GU: no dysuria, burning on urination, increased urinary frequency, hematuria  Ext: no leg edema Neuro: no unilateral weakness, numbness, or tingling, no vision change or hearing loss Skin: no rash MSK: No muscle spasm, no deformity, no limitation of range of movement in spin Heme: No easy bruising.  Travel history: No recent long distant travel.  Allergy: No Known Allergies  Past Medical History  Diagnosis Date  . PONV (postoperative nausea and vomiting)   . Headache(784.0)     not since surgery for spinal leak  . Blood dyscrasia     thrombocytosis not on any meds  . Chronic back pain   . Anemia     History  . Essential thrombocytosis (HCC)     pt. denies this diagnosis  . Tachycardia     Past Surgical History  Procedure Laterality Date  . Cyst excision  11/11/2012    lumbar cyst remove  . Maximum access (mas)posterior lumbar interbody fusion (plif) 1 level N/A 01/11/2014    Procedure: FOR MAXIMUM ACCESS (MAS) POSTERIOR LUMBAR INTERBODY FUSION Lumbar Five Sacral One;  Surgeon: [name], MD;  Location: [facility];  Service: Neurosurgery;  Laterality: N/A;  FOR MAXIMUM ACCESS (MAS) POSTERIOR LUMBAR INTERBODY FUSION Lumbar Five Sacral One  . Back surgery      x 4 lower back  . Wisdom tooth extraction    . Perm. dental implant      lower back right  Social History:  reports that she has quit smoking. Her smoking use included Cigarettes. She has a .25 pack-year smoking history. She has never used smokeless tobacco. She reports that she does not drink alcohol or use illicit drugs.  Family History:  Family History  Problem Relation Age of Onset  . Colon cancer Neg Hx   . Colon polyps Neg Hx   . Rectal cancer Neg Hx   . Stomach cancer Neg Hx   . Esophageal  cancer Neg Hx   . Heart disease Mother   . Melanoma Mother      Prior to Admission medications   Medication Sig Start Date End Date Taking? Authorizing Provider  DULoxetine (CYMBALTA) 60 MG capsule Take 60 mg by mouth daily.   Yes Historical Provider, MD  furosemide (LASIX) 20 MG tablet Take 20 mg by mouth daily.  08/29/15  Yes Historical Provider, MD  HYDROmorphone (DILAUDID) 4 MG tablet Take by mouth every 4 (four) hours as needed for severe pain.   Yes Historical Provider, MD  methadone (DOLOPHINE) 10 MG tablet Take 10 mg by mouth 3 (three) times daily.    Yes Historical Provider, MD  naloxegol oxalate (MOVANTIK) 25 MG TABS tablet Take 25 mg by mouth daily.   Yes Historical Provider, MD  vitamin B-12 (CYANOCOBALAMIN) 1000 MCG tablet Take 1,000 mcg by mouth daily.   Yes Historical Provider, MD  Vitamin D, Ergocalciferol, (DRISDOL) 50000 UNITS CAPS capsule Take 50,000 Units by mouth every 7 (seven) days.  08/29/15  Yes Historical Provider, MD  Na Sulfate-K Sulfate-Mg Sulf (SUPREP BOWEL PREP) SOLN Take 1 kit by mouth once. Name brand only, suprep as directed, no substitutions 11/29/15   Jerene Bears, MD    Physical Exam: Filed Vitals:   02/29/16 2015 02/29/16 2030 02/29/16 2045 02/29/16 2100  BP: 146/88 144/89 147/88 150/89  Pulse: 106 104 108 105  Temp:      TempSrc:      Resp:      Height:      Weight:      SpO2: 96% 97% 95% 96%   General: Not in acute distress HEENT:       Eyes: PERRL, EOMI, no scleral icterus.       ENT: No discharge from the ears and nose, no pharynx injection, no tonsillar enlargement.        Neck: No JVD, no bruit, no mass felt. Heme: No neck lymph node enlargement. Cardiac: S1/S2, RRR, No murmurs, No gallops or rubs. Pulm:  No rales, wheezing, rhonchi or rubs. Abd: distended, diffused tenderness, no rebound pain, no organomegaly, BS present. GU: No hematuria Ext: No pitting leg edema bilaterally. 2+DP/PT pulse bilaterally. Musculoskeletal: No joint  deformities, No joint redness or warmth, no limitation of ROM in spin. Skin: No rashes.  Neuro: Alert, oriented X3, cranial nerves II-XII grossly intact, moves all extremities normally. Psych: Patient is not psychotic, no suicidal or hemocidal ideation.  Labs on Admission: I have personally reviewed following labs and imaging studies  CBC:  Recent Labs Lab 02/29/16 1457  WBC 14.0*  HGB 12.4  HCT 39.4  MCV 72.2*  PLT 676*   Basic Metabolic Panel:  Recent Labs Lab 02/29/16 1457  NA 133*  K 3.7  CL 98*  CO2 23  GLUCOSE 127*  BUN 12  CREATININE 0.57  CALCIUM 9.5   GFR: Estimated Creatinine Clearance: 68.1 mL/min (by C-G formula based on Cr of 0.57). Liver Function Tests:  Recent Labs Lab 02/29/16 1457  AST 28  ALT 23  ALKPHOS 88  BILITOT 0.4  PROT 6.9  ALBUMIN 3.7    Recent Labs Lab 02/29/16 1457  LIPASE 17   No results for input(s): AMMONIA in the last 168 hours. Coagulation Profile: No results for input(s): INR, PROTIME in the last 168 hours. Cardiac Enzymes: No results for input(s): CKTOTAL, CKMB, CKMBINDEX, TROPONINI in the last 168 hours. BNP (last 3 results) No results for input(s): PROBNP in the last 8760 hours. HbA1C: No results for input(s): HGBA1C in the last 72 hours. CBG: No results for input(s): GLUCAP in the last 168 hours. Lipid Profile: No results for input(s): CHOL, HDL, LDLCALC, TRIG, CHOLHDL, LDLDIRECT in the last 72 hours. Thyroid Function Tests: No results for input(s): TSH, T4TOTAL, FREET4, T3FREE, THYROIDAB in the last 72 hours. Anemia Panel: No results for input(s): VITAMINB12, FOLATE, FERRITIN, TIBC, IRON, RETICCTPCT in the last 72 hours. Urine analysis:    Component Value Date/Time   COLORURINE YELLOW 12/18/2012 [place] 12/18/2012 1754   LABSPEC 1.015 12/18/2012 1754   PHURINE 7.0 12/18/2012 1754   GLUCOSEU NEGATIVE 12/18/2012 1754   HGBUR NEGATIVE 12/18/2012 1754   BILIRUBINUR NEGATIVE 12/18/2012  1754   KETONESUR 40* 12/18/2012 1754   PROTEINUR NEGATIVE 12/18/2012 1754   UROBILINOGEN 1.0 12/18/2012 1754   NITRITE NEGATIVE 12/18/2012 1754   LEUKOCYTESUR NEGATIVE 12/18/2012 1754   Sepsis Labs: '@LABRCNTIP'$ (procalcitonin:4,lacticidven:4) )No results found for this or any previous visit (from the past 240 hour(s)).   Radiological Exams on Admission: Ct Abdomen Pelvis W Contrast  02/29/2016  CLINICAL DATA:  Acute onset of constipation, nausea and vomiting. Initial encounter. EXAM: CT ABDOMEN AND PELVIS WITH CONTRAST TECHNIQUE: Multidetector CT imaging of the abdomen and pelvis was performed using the standard protocol following bolus administration of intravenous contrast. CONTRAST:  157m ISOVUE-300 IOPAMIDOL (ISOVUE-300) INJECTION 61% COMPARISON:  CT of the lumbar spine performed 01/25/2014, and abdominal radiograph performed earlier today at 2:46 p.m. FINDINGS: The visualized lung bases are clear. A moderate hiatal hernia is noted. The patient's enteric tube is seen coiled within the hiatal hernia, ending about the superior aspect of the hernia. Small volume ascites is noted within the abdomen and pelvis. Nonspecific hypodensities are seen within the liver, measuring up to 2.2 cm in size. Metastatic disease cannot be excluded. The spleen is unremarkable in appearance. The gallbladder is within normal limits. The pancreas and adrenal glands are unremarkable. The kidneys are unremarkable in appearance. There is no evidence of hydronephrosis. No renal or ureteral stones are seen. No perinephric stranding is appreciated. No free fluid is identified. The small bowel is unremarkable in appearance. The stomach is within normal limits. No acute vascular abnormalities are seen. The appendix is not definitely characterized. The colon is diffusely filled with fluid and air. There is interposition of the hepatic flexure of the colon anterior to the liver. This appears to reflect partial obstruction due to a mass  at the distal sigmoid colon, measuring approximately 6.6 x 3.2 x 3.8 cm. Small adjacent nodules are seen, concerning for local spread of disease. No pelvic sidewall lymphadenopathy is seen. The bladder is mildly distended and grossly unremarkable in appearance. A 1.7 cm nodule posterior to the uterus may simply reflect an exophytic fibroid. The uterus is otherwise unremarkable. The ovaries are relatively symmetric. No suspicious adnexal masses are seen. No inguinal lymphadenopathy is seen. No acute osseous abnormalities are identified. The patient is status post lumbar spinal fusion at L5-S1. IMPRESSION: 1. Colonic mass noted at  the distal sigmoid colon, measuring 6.6 x 3.2 x 3.8 cm, resulting in partial obstruction. The colon is diffusely filled with fluid and air, more proximally. Findings compatible with primary colonic malignancy. 2. Small adjacent nodules seen, concerning for local spread of disease. No pelvic sidewall lymphadenopathy seen. 3. Small volume ascites within the abdomen and pelvis. 4. Nonspecific hypodensities within the liver, measuring up to 2.2 cm in size. Metastatic disease cannot be excluded. Would correlate with LFTs, and consider further evaluation as deemed clinically appropriate. 5. Moderate hiatal hernia seen. 6. Enteric tube noted coiled within the hiatal hernia, ending about the superior aspect of the hernia. 7. 1.7 cm nodule posterior to the uterus may simply reflect an exophytic fibroid. Electronically Signed   By: Garald Balding M.D.   On: 02/29/2016 22:18   Dg Abd 2 Views  02/29/2016  CLINICAL DATA:  Constipation and vomit EXAM: ABDOMEN - 2 VIEW COMPARISON:  None. FINDINGS: Supine and upright images obtained. There is generalized bowel dilatation with multiple air-fluid levels. No free air evident. Postoperative changes noted at L5 and S1. There is atelectatic change in lung bases. IMPRESSION: Bowel gas pattern is consistent with obstruction. No free air evident. Atelectasis lung  bases, slightly more on the right than on the left. Electronically Signed   By: Lowella Grip III M.D.   On: 02/29/2016 15:15     EKG: Not done in ED, will get one.   Assessment/Plan Principal Problem:   Mass of colon Active Problems:   Essential thrombocytosis (HCC)   Scoliosis of lumbar spine   Lumbar radiculopathy   Bowel obstruction (HCC)   Depression   Colonic mass   Nausea & vomiting   Abdominal pain   Mass of colon/bowel obstruction: Patient's nausea, vomiting and abdominal pain are caused by partial bowel obstruction secondary to massive colon. It is very concerning for colon cancer.  -Will place on tele bed for obs -NGT placed in ED -NPO -INR/PTT/type & screen -check CEA -please call oncology in AM  Lumbar radiculopathy: pt is on chronic Dilaudid and methadone for back pain  -IV Dilaudid when necessary -hold oral pain meds   Depression: Stable, no suicidal or homicidal ideations. -hold home oral medications: Cymbalta  Essential thrombocytosis [facility]): pt was seen by oncologist Dr. Alvy Bimler on 12/06/13. Per dr. Calton Dach note, pt has abnormalities with JAK 2 mutation, suggestive of essential thrombocytosis. She was being observed. Pt is supposed to take ASA 81 mg daily, which she is taking inconsitently. -will start ASA when able to eat. -follow up with oncologist   DVT ppx: SCD Code Status: Full code Family Communication: None at bed side.  Disposition Plan:  Anticipate discharge back to previous home environment Consults called:  none Admission status: Obs / tele  Date of Service 02/29/2016    Ivor Costa [facility] Pager [phone]  If 7PM-7AM, please contact night-coverage [email or web address] Password [email or web address] 02/29/2016, 10:40 PM

## 2016-02-29 NOTE — ED Notes (Signed)
Notified Dr. Laneta Simmers of Platelet count of 954 and no new orders

## 2016-02-29 NOTE — ED Notes (Signed)
Pt states her last bm was last Thursday (on chronic pain meds).  She states went to her pcp who placed her on Movantik and a clear diet.  Pt states X-ray yesterday did not show a blockage.  Pt called her pcp today b/c she is unable to keep any food down and still had not had a bm, and they told her to come here.

## 2016-02-29 NOTE — ED Notes (Signed)
Notified Dr. Laneta Simmers of platelet count of 954.

## 2016-03-01 DIAGNOSIS — K6389 Other specified diseases of intestine: Secondary | ICD-10-CM | POA: Diagnosis not present

## 2016-03-01 DIAGNOSIS — C189 Malignant neoplasm of colon, unspecified: Secondary | ICD-10-CM | POA: Diagnosis not present

## 2016-03-01 DIAGNOSIS — F329 Major depressive disorder, single episode, unspecified: Secondary | ICD-10-CM | POA: Diagnosis not present

## 2016-03-01 DIAGNOSIS — E860 Dehydration: Secondary | ICD-10-CM | POA: Diagnosis present

## 2016-03-01 DIAGNOSIS — J90 Pleural effusion, not elsewhere classified: Secondary | ICD-10-CM | POA: Diagnosis not present

## 2016-03-01 DIAGNOSIS — I959 Hypotension, unspecified: Secondary | ICD-10-CM | POA: Diagnosis not present

## 2016-03-01 DIAGNOSIS — E440 Moderate protein-calorie malnutrition: Secondary | ICD-10-CM | POA: Diagnosis not present

## 2016-03-01 DIAGNOSIS — E871 Hypo-osmolality and hyponatremia: Secondary | ICD-10-CM | POA: Diagnosis present

## 2016-03-01 DIAGNOSIS — N135 Crossing vessel and stricture of ureter without hydronephrosis: Secondary | ICD-10-CM | POA: Diagnosis present

## 2016-03-01 DIAGNOSIS — M5416 Radiculopathy, lumbar region: Secondary | ICD-10-CM | POA: Diagnosis present

## 2016-03-01 DIAGNOSIS — L03311 Cellulitis of abdominal wall: Secondary | ICD-10-CM | POA: Diagnosis not present

## 2016-03-01 DIAGNOSIS — K5660 Unspecified intestinal obstruction: Secondary | ICD-10-CM | POA: Diagnosis present

## 2016-03-01 DIAGNOSIS — D72819 Decreased white blood cell count, unspecified: Secondary | ICD-10-CM | POA: Diagnosis not present

## 2016-03-01 DIAGNOSIS — D473 Essential (hemorrhagic) thrombocythemia: Secondary | ICD-10-CM | POA: Diagnosis not present

## 2016-03-01 DIAGNOSIS — K5649 Other impaction of intestine: Secondary | ICD-10-CM | POA: Diagnosis not present

## 2016-03-01 DIAGNOSIS — C786 Secondary malignant neoplasm of retroperitoneum and peritoneum: Secondary | ICD-10-CM | POA: Diagnosis present

## 2016-03-01 DIAGNOSIS — Z79891 Long term (current) use of opiate analgesic: Secondary | ICD-10-CM | POA: Diagnosis not present

## 2016-03-01 DIAGNOSIS — C7919 Secondary malignant neoplasm of other urinary organs: Secondary | ICD-10-CM | POA: Diagnosis present

## 2016-03-01 DIAGNOSIS — Z7982 Long term (current) use of aspirin: Secondary | ICD-10-CM | POA: Diagnosis not present

## 2016-03-01 DIAGNOSIS — G8929 Other chronic pain: Secondary | ICD-10-CM | POA: Diagnosis present

## 2016-03-01 DIAGNOSIS — Z87891 Personal history of nicotine dependence: Secondary | ICD-10-CM | POA: Diagnosis not present

## 2016-03-01 DIAGNOSIS — D649 Anemia, unspecified: Secondary | ICD-10-CM | POA: Diagnosis present

## 2016-03-01 DIAGNOSIS — C19 Malignant neoplasm of rectosigmoid junction: Secondary | ICD-10-CM | POA: Diagnosis present

## 2016-03-01 DIAGNOSIS — E876 Hypokalemia: Secondary | ICD-10-CM | POA: Diagnosis present

## 2016-03-01 DIAGNOSIS — A419 Sepsis, unspecified organism: Secondary | ICD-10-CM | POA: Diagnosis not present

## 2016-03-01 DIAGNOSIS — R112 Nausea with vomiting, unspecified: Secondary | ICD-10-CM | POA: Diagnosis not present

## 2016-03-01 DIAGNOSIS — C787 Secondary malignant neoplasm of liver and intrahepatic bile duct: Secondary | ICD-10-CM | POA: Diagnosis present

## 2016-03-01 DIAGNOSIS — R160 Hepatomegaly, not elsewhere classified: Secondary | ICD-10-CM | POA: Diagnosis present

## 2016-03-01 DIAGNOSIS — R1010 Upper abdominal pain, unspecified: Secondary | ICD-10-CM | POA: Diagnosis not present

## 2016-03-01 DIAGNOSIS — R1013 Epigastric pain: Secondary | ICD-10-CM | POA: Diagnosis not present

## 2016-03-01 DIAGNOSIS — R109 Unspecified abdominal pain: Secondary | ICD-10-CM | POA: Diagnosis present

## 2016-03-01 DIAGNOSIS — K5669 Other intestinal obstruction: Secondary | ICD-10-CM | POA: Diagnosis not present

## 2016-03-01 DIAGNOSIS — R1084 Generalized abdominal pain: Secondary | ICD-10-CM | POA: Diagnosis not present

## 2016-03-01 DIAGNOSIS — R188 Other ascites: Secondary | ICD-10-CM | POA: Diagnosis present

## 2016-03-01 DIAGNOSIS — M419 Scoliosis, unspecified: Secondary | ICD-10-CM | POA: Diagnosis present

## 2016-03-01 LAB — BASIC METABOLIC PANEL
Anion gap: 9 (ref 5–15)
BUN: 11 mg/dL (ref 6–20)
CHLORIDE: 104 mmol/L (ref 101–111)
CO2: 25 mmol/L (ref 22–32)
Calcium: 8.7 mg/dL — ABNORMAL LOW (ref 8.9–10.3)
Creatinine, Ser: 0.54 mg/dL (ref 0.44–1.00)
GFR calc non Af Amer: >60 mL/min (ref >60)
Glucose, Bld: 87 mg/dL (ref 65–99)
POTASSIUM: 3.2 mmol/L — AB (ref 3.5–5.1)
SODIUM: 138 mmol/L (ref 135–145)

## 2016-03-01 LAB — PROTIME-INR
INR: 1.29 (ref 0.00–1.49)
PROTHROMBIN TIME: 16.2 s — AB (ref 11.6–15.2)

## 2016-03-01 LAB — APTT: aPTT: 31 seconds (ref 24–37)

## 2016-03-01 LAB — CBC
HCT: 33.7 % — ABNORMAL LOW (ref 36.0–46.0)
HEMOGLOBIN: 10.2 g/dL — AB (ref 12.0–15.0)
MCH: 22.2 pg — AB (ref 26.0–34.0)
MCHC: 30.3 g/dL (ref 30.0–36.0)
MCV: 73.4 fL — ABNORMAL LOW (ref 78.0–100.0)
Platelets: 727 10*3/uL — ABNORMAL HIGH (ref 150–400)
RBC: 4.59 MIL/uL (ref 3.87–5.11)
RDW: 16 % — ABNORMAL HIGH (ref 11.5–15.5)
WBC: 13 10*3/uL — ABNORMAL HIGH (ref 4.0–10.5)

## 2016-03-01 LAB — TYPE AND SCREEN
ABO/RH(D): A NEG
Antibody Screen: NEGATIVE

## 2016-03-01 LAB — GLUCOSE, CAPILLARY: GLUCOSE-CAPILLARY: 74 mg/dL (ref 65–99)

## 2016-03-01 LAB — LACTIC ACID, PLASMA: LACTIC ACID, VENOUS: 0.7 mmol/L (ref 0.5–2.0)

## 2016-03-01 MED ORDER — POTASSIUM CHLORIDE 10 MEQ/100ML IV SOLN
10.0000 meq | Freq: Once | INTRAVENOUS | Status: AC
  Administered 2016-03-01: 10 meq via INTRAVENOUS

## 2016-03-01 MED ORDER — ENOXAPARIN SODIUM 40 MG/0.4ML ~~LOC~~ SOLN
40.0000 mg | Freq: Once | SUBCUTANEOUS | Status: AC
  Administered 2016-03-01: 40 mg via SUBCUTANEOUS
  Filled 2016-03-01: qty 0.4

## 2016-03-01 MED ORDER — SODIUM CHLORIDE 0.9 % IV SOLN
INTRAVENOUS | Status: DC
  Administered 2016-03-01 (×2): via INTRAVENOUS
  Administered 2016-03-03: 1000 mL via INTRAVENOUS
  Administered 2016-03-04: 04:00:00 via INTRAVENOUS

## 2016-03-01 MED ORDER — POTASSIUM CHLORIDE 10 MEQ/100ML IV SOLN
10.0000 meq | INTRAVENOUS | Status: AC
  Administered 2016-03-01: 10 meq via INTRAVENOUS
  Filled 2016-03-01 (×2): qty 100

## 2016-03-01 NOTE — Consult Note (Signed)
Reason for Consult:Large bowel obstruction Referring Physician: Taniyah Ballow is an 60 y.o. female.  HPI: This patient first noted problems with generalized abdominal bloating and borborigmy about 9 days ago.  This was not associated with nausea or vomiting.  She saw her PCP who, because of her history of chronic back pain, thought this was related to her chronic narcotic use.  He prescribed a laxative, but when this failed to improve her condition he told her to come to the emergency department where she was seen yesterday and found to have a large bowel obstruction.  She has not had a bowel movement or passed gas for a week.  No colonoscopy ever.  Scheduled for colonoscopy in the fall of 2016, cancel because of tachycardia.  Again scheduled in early this year, canceled for reaction to prep.  Dr. Hilarie Fredrickson is her GI doctor, Dr. Marton Redwood is her PCP, and Dr. Dierdre Harness is her neurosurgeon.  Past Medical History  Diagnosis Date  . PONV (postoperative nausea and vomiting)   . Headache(784.0)     not since surgery for spinal leak  . Blood dyscrasia     thrombocytosis not on any meds  . Chronic back pain   . Anemia     History  . Essential thrombocytosis (HCC)     pt. denies this diagnosis  . Tachycardia     Past Surgical History  Procedure Laterality Date  . Cyst excision  11/11/2012    lumbar cyst remove  . Maximum access (mas)posterior lumbar interbody fusion (plif) 1 level N/A 01/11/2014    Procedure: FOR MAXIMUM ACCESS (MAS) POSTERIOR LUMBAR INTERBODY FUSION Lumbar Five Sacral One;  Surgeon: [name], MD;  Location: [facility];  Service: Neurosurgery;  Laterality: N/A;  FOR MAXIMUM ACCESS (MAS) POSTERIOR LUMBAR INTERBODY FUSION Lumbar Five Sacral One  . Back surgery      x 4 lower back  . Wisdom tooth extraction    . Perm. dental implant      lower back right    Family History  Problem Relation Age of Onset  . Colon cancer Neg Hx   . Colon polyps Neg Hx   .  Rectal cancer Neg Hx   . Stomach cancer Neg Hx   . Esophageal cancer Neg Hx   . Heart disease Mother   . Melanoma Mother     Social History:  reports that she has quit smoking. Her smoking use included Cigarettes. She has a .25 pack-year smoking history. She has never used smokeless tobacco. She reports that she does not drink alcohol or use illicit drugs.  Allergies: No Known Allergies  Medications: I have reviewed the patient's current medications.  Results for orders placed or performed during the hospital encounter of 02/29/16 (from the past 48 hour(s))  Lipase, blood     Status: None   Collection Time: 02/29/16  2:57 PM  Result Value Ref Range   Lipase 17 11 - 51 U/L  Comprehensive metabolic panel     Status: Abnormal   Collection Time: 02/29/16  2:57 PM  Result Value Ref Range   Sodium 133 (L) 135 - 145 mmol/L   Potassium 3.7 3.5 - 5.1 mmol/L   Chloride 98 (L) 101 - 111 mmol/L   CO2 23 22 - 32 mmol/L   Glucose, Bld 127 (H) 65 - 99 mg/dL   BUN 12 6 - 20 mg/dL   Creatinine, Ser 0.57 0.44 - 1.00 mg/dL   Calcium 9.5 8.9 -  10.3 mg/dL   Total Protein 6.9 6.5 - 8.1 g/dL   Albumin 3.7 3.5 - 5.0 g/dL   AST 28 15 - 41 U/L   ALT 23 14 - 54 U/L   Alkaline Phosphatase 88 38 - 126 U/L   Total Bilirubin 0.4 0.3 - 1.2 mg/dL   GFR calc non Af Amer >60 >60 mL/min   GFR calc Af Amer >60 >60 mL/min    Comment: (NOTE) The eGFR has been calculated using the CKD EPI equation. This calculation has not been validated in all clinical situations. eGFR's persistently <60 mL/min signify possible Chronic Kidney Disease.    Anion gap 12 5 - 15  CBC     Status: Abnormal   Collection Time: 02/29/16  2:57 PM  Result Value Ref Range   WBC 14.0 (H) 4.0 - 10.5 K/uL   RBC 5.46 (H) 3.87 - 5.11 MIL/uL   Hemoglobin 12.4 12.0 - 15.0 g/dL   HCT 39.4 36.0 - 46.0 %   MCV 72.2 (L) 78.0 - 100.0 fL   MCH 22.7 (L) 26.0 - 34.0 pg   MCHC 31.5 30.0 - 36.0 g/dL   RDW 16.0 (H) 11.5 - 15.5 %   Platelets 954  (HH) 150 - 400 K/uL    Comment: REPEATED TO VERIFY SPECIMEN CHECKED FOR CLOTS PLATELET COUNT CONFIRMED BY SMEAR CRITICAL RESULT CALLED TO, READ BACK BY AND VERIFIED WITH: S NEESOM,RN AT 1604 ON 6.2.17 BY W JOHNSON   Type and screen [place]     Status: None   Collection Time: 02/29/16 11:30 PM  Result Value Ref Range   ABO/RH(D) A NEG    Antibody Screen NEG    Sample Expiration 03/03/2016   Protime-INR     Status: Abnormal   Collection Time: 02/29/16 11:43 PM  Result Value Ref Range   Prothrombin Time 16.2 (H) 11.6 - 15.2 seconds   INR 1.29 0.00 - 1.49  APTT     Status: None   Collection Time: 02/29/16 11:43 PM  Result Value Ref Range   aPTT 31 24 - 37 seconds  Lactic acid, plasma     Status: None   Collection Time: 02/29/16 11:44 PM  Result Value Ref Range   Lactic Acid, Venous 0.7 0.5 - 2.0 mmol/L  Basic metabolic panel     Status: Abnormal   Collection Time: 03/01/16  5:30 AM  Result Value Ref Range   Sodium 138 135 - 145 mmol/L   Potassium 3.2 (L) 3.5 - 5.1 mmol/L   Chloride 104 101 - 111 mmol/L   CO2 25 22 - 32 mmol/L   Glucose, Bld 87 65 - 99 mg/dL   BUN 11 6 - 20 mg/dL   Creatinine, Ser 0.54 0.44 - 1.00 mg/dL   Calcium 8.7 (L) 8.9 - 10.3 mg/dL   GFR calc non Af Amer >60 >60 mL/min   GFR calc Af Amer >60 >60 mL/min    Comment: (NOTE) The eGFR has been calculated using the CKD EPI equation. This calculation has not been validated in all clinical situations. eGFR's persistently <60 mL/min signify possible Chronic Kidney Disease.    Anion gap 9 5 - 15    Ct Abdomen Pelvis W Contrast  02/29/2016  CLINICAL DATA:  Acute onset of constipation, nausea and vomiting. Initial encounter. EXAM: CT ABDOMEN AND PELVIS WITH CONTRAST TECHNIQUE: Multidetector CT imaging of the abdomen and pelvis was performed using the standard protocol following bolus administration of intravenous contrast. CONTRAST:  142m ISOVUE-300  IOPAMIDOL (ISOVUE-300) INJECTION 61%  COMPARISON:  CT of the lumbar spine performed 01/25/2014, and abdominal radiograph performed earlier today at 2:46 p.m. FINDINGS: The visualized lung bases are clear. A moderate hiatal hernia is noted. The patient's enteric tube is seen coiled within the hiatal hernia, ending about the superior aspect of the hernia. Small volume ascites is noted within the abdomen and pelvis. Nonspecific hypodensities are seen within the liver, measuring up to 2.2 cm in size. Metastatic disease cannot be excluded. The spleen is unremarkable in appearance. The gallbladder is within normal limits. The pancreas and adrenal glands are unremarkable. The kidneys are unremarkable in appearance. There is no evidence of hydronephrosis. No renal or ureteral stones are seen. No perinephric stranding is appreciated. No free fluid is identified. The small bowel is unremarkable in appearance. The stomach is within normal limits. No acute vascular abnormalities are seen. The appendix is not definitely characterized. The colon is diffusely filled with fluid and air. There is interposition of the hepatic flexure of the colon anterior to the liver. This appears to reflect partial obstruction due to a mass at the distal sigmoid colon, measuring approximately 6.6 x 3.2 x 3.8 cm. Small adjacent nodules are seen, concerning for local spread of disease. No pelvic sidewall lymphadenopathy is seen. The bladder is mildly distended and grossly unremarkable in appearance. A 1.7 cm nodule posterior to the uterus may simply reflect an exophytic fibroid. The uterus is otherwise unremarkable. The ovaries are relatively symmetric. No suspicious adnexal masses are seen. No inguinal lymphadenopathy is seen. No acute osseous abnormalities are identified. The patient is status post lumbar spinal fusion at L5-S1. IMPRESSION: 1. Colonic mass noted at the distal sigmoid colon, measuring 6.6 x 3.2 x 3.8 cm, resulting in partial obstruction. The colon is diffusely filled  with fluid and air, more proximally. Findings compatible with primary colonic malignancy. 2. Small adjacent nodules seen, concerning for local spread of disease. No pelvic sidewall lymphadenopathy seen. 3. Small volume ascites within the abdomen and pelvis. 4. Nonspecific hypodensities within the liver, measuring up to 2.2 cm in size. Metastatic disease cannot be excluded. Would correlate with LFTs, and consider further evaluation as deemed clinically appropriate. 5. Moderate hiatal hernia seen. 6. Enteric tube noted coiled within the hiatal hernia, ending about the superior aspect of the hernia. 7. 1.7 cm nodule posterior to the uterus may simply reflect an exophytic fibroid. Electronically Signed   By: Roanna Raider M.D.   On: 02/29/2016 22:18   Dg Abd 2 Views  02/29/2016  CLINICAL DATA:  Constipation and vomit EXAM: ABDOMEN - 2 VIEW COMPARISON:  None. FINDINGS: Supine and upright images obtained. There is generalized bowel dilatation with multiple air-fluid levels. No free air evident. Postoperative changes noted at L5 and S1. There is atelectatic change in lung bases. IMPRESSION: Bowel gas pattern is consistent with obstruction. No free air evident. Atelectasis lung bases, slightly more on the right than on the left. Electronically Signed   By: Bretta Bang III M.D.   On: 02/29/2016 15:15    Review of Systems  Constitutional: Negative for fever and chills.  Eyes: Negative.   Respiratory: Negative.   Cardiovascular: Negative.   Gastrointestinal: Positive for constipation.       Bloating and obstipation  Musculoskeletal: Negative.   Neurological: Negative.   Endo/Heme/Allergies: Negative.   Psychiatric/Behavioral: Negative.    Blood pressure 142/78, pulse 98, temperature 99.3 F (37.4 C), temperature source Oral, resp. rate 16, height 5\' 5"  (1.651 m), weight 57.635  kg (127 lb 1 oz), SpO2 98 %. Physical Exam  Nursing note and vitals reviewed. Constitutional: She is oriented to person,  place, and time. She appears well-developed and well-nourished. No distress.  Small in stature  HENT:  Head: Normocephalic and atraumatic.  Eyes: Conjunctivae and EOM are normal. Pupils are equal, round, and reactive to light.  Neck: Normal range of motion. Neck supple.  Cardiovascular: Regular rhythm, normal heart sounds and intact distal pulses.  Exam reveals no friction rub.   No murmur heard. P 110  Respiratory: Effort normal and breath sounds normal. No respiratory distress. She has no wheezes. She has no rales. She exhibits no tenderness.  GI: Soft. She exhibits distension. She exhibits no shifting dullness, no pulsatile liver, no fluid wave, no abdominal bruit, no ascites and no mass. Bowel sounds are increased. There is no tenderness. There is no rigidity, no rebound, no guarding and no CVA tenderness. No hernia.  Musculoskeletal: Normal range of motion.  Neurological: She is alert and oriented to person, place, and time. She has normal reflexes.  Skin: Skin is warm and dry.  Psychiatric: Her speech is normal and behavior is normal. Judgment and thought content normal. Her mood appears anxious.    Assessment/Plan: Unfortunate, very nice female patient, with large bowel obstruction, partial, likely from and obstructing tumor.  She hsa an NGT in place which does not seem to be doing any good with decompression.  She is not tender, therefore the urgency for surgery is not much although this will not get better without surgery.  More than likely she will need a diverting colostomy, possible resection.  Unknown if she has metastatic disease.  Will check CEA level.  Discuss when surgery is possible.  Loetta Connelley 03/01/2016, 6:42 AM

## 2016-03-01 NOTE — Progress Notes (Signed)
Patient ID: Carolyn Barton, female   DOB: 24-May-1956, 60 y.o.   MRN: [id number]                                                                PROGRESS NOTE                                                                                                                                                                                                             Patient Demographics:    Carolyn Barton, is a 60 y.o. female, DOB - Sep 03, 1956, [id number]  Admit date - 02/29/2016   Admitting Physician Ivor Costa, MD  Outpatient Primary MD for the patient is Marton Redwood, MD  LOS - 1d  Outpatient Specialists:   [facility], Dierdre Harness neurosurgery   Chief Complaint  Patient presents with  . Constipation       Brief Narrative  60 y.o. female with medical history significant of chronic back pain, thrombocytosis, depression, tachycardia, who presents with nausea, vomiting, abdominal pain and constipation.  Patient reports that she has been having severe constipation, nausea, vomiting and abdominal pain for about one week. Her last bowel movement was a week ago. She has vomited 1 to 2 time each day without blood in the vomitus. She has a diffused abdominal pain, intermittent, 5 out of 10 in severity, nonradiating. It is not aggravated or alleviated by any fractures. The patient denies chest pain, shortness of breath, cough, symptoms of UTI or unilateral weakness. She was given prescription of Movantik by her PCP without help.  ED Course: pt was found to have WBC 14.0, lipase is 17, temperature normal, tachycardia, renal function okay. X-ray showed possible bowel obstruction. Pt is place on tele bed for obs.  # CT abdomen/pelvis showed: 1. colonic mass noted at the distal sigmoid colon, measuring 6.6 x 3.2 x 3.8 cm, resulting in partial obstruction, compatible with primary colonic malignancy, 2. mall adjacent nodules seen, concerning for local spread of disease. No pelvic sidewall  lymphadenopathy seen. 3. Small volume ascites within the abdomen and pelvis. 4. Nonspecific hypodensities within the liver, measuring up to 2.2 cm in size. Metastatic disease cannot be excluded. 5. Moderate hiatal hernia seen. 7. 1.7 cm nodule posterior to the uterus may simply reflect an exophytic fibroid.   Subjective:  [name] today denies abd pain, n/v, diarrhea, brbpr, black stool.  Pt has NGT in place, and is on ice chips per surgery,  Pt awaiting surgical intervention for removal of colonic mass.     Assessment  & Plan :    Principal Problem:   Mass of colon Active Problems:   Essential thrombocytosis (HCC)   Scoliosis of lumbar spine   Lumbar radiculopathy   Bowel obstruction (HCC)   Depression   Colonic mass   Nausea & vomiting   Abdominal pain   1.  Colonic mass Appreciate surgery input Awaiting CEA NPO  I spoke with Dr. Jana Hakim, apparently GI oncology (Dr. Benay Spice), will be available on Monday, and can be consulted then   2. Partial bowel obstruction NPO, NGT to low intermittent suction Awaiting removal of colonic mass.   3.  Hypokalemia Replete Check cmp in am  4.  N/v resolved for now zofran iv prn  5.  Thrombocytosis (pt was seen by oncologist Dr. Alvy Bimler on 12/06/13. Per dr. Calton Dach note, pt has abnormalities with JAK 2 mutation, suggestive of essential thrombocytosis) Repeat cbc in am Check ferritin, iron, tibc  Code Status : FULL CODE  Family Communication  :  W/ patient  Disposition Plan  :  home  Barriers For Discharge : none  Consults  :  surgery  Procedures  :  Awaiting colonic mass resection  DVT Prophylaxis  :  lovenox  Lab Results  Component Value Date   PLT 727* 03/01/2016    Antibiotics  :  none  Anti-infectives    None        Objective:   Filed Vitals:   02/29/16 2100 02/29/16 2301 03/01/16 0005 03/01/16 0608  BP: 150/89 136/95 141/83 142/78  Pulse: 105 113 103 98  Temp:   99.2 F (37.3 C) 99.3 F  (37.4 C)  TempSrc:    Oral  Resp:  18  16  Height:   5\' 5"  (1.651 m)   Weight:   57.635 kg (127 lb 1 oz)   SpO2: 96% 93% 96% 98%    Wt Readings from Last 3 Encounters:  03/01/16 57.635 kg (127 lb 1 oz)  11/29/15 62.596 kg (138 lb)  11/12/15 62.143 kg (137 lb)    No intake or output data in the 24 hours ending 03/01/16 0741   Physical Exam  Awake Alert, Oriented X 3, No new F.N deficits, Normal affect [place].AT,PERRAL Supple Neck,No JVD, No cervical lymphadenopathy appriciated.  Symmetrical Chest wall movement, Good air movement bilaterally, CTAB RRR,No Gallops,Rubs or new Murmurs, No Parasternal Heave +ve B.Sounds, Abd Soft, No tenderness, No organomegaly appriciated, No rebound - guarding or rigidity. No Cyanosis, Clubbing or edema, No new Rash or bruise  NGT in place   Data Review:    CBC  Recent Labs Lab 02/29/16 1457 03/01/16 0530  WBC 14.0* 13.0*  HGB 12.4 10.2*  HCT 39.4 33.7*  PLT 954* 727*  MCV 72.2* 73.4*  MCH 22.7* 22.2*  MCHC 31.5 30.3  RDW 16.0* 16.0*    Chemistries   Recent Labs Lab 02/29/16 1457 03/01/16 0530  NA 133* 138  K 3.7 3.2*  CL 98* 104  CO2 23 25  GLUCOSE 127* 87  BUN 12 11  CREATININE 0.57 0.54  CALCIUM 9.5 8.7*  AST 28  --   ALT 23  --   ALKPHOS 88  --   BILITOT 0.4  --    ------------------------------------------------------------------------------------------------------------------ No results for input(s): CHOL, HDL, LDLCALC, TRIG,  CHOLHDL, LDLDIRECT in the last 72 hours.  No results found for: HGBA1C ------------------------------------------------------------------------------------------------------------------ No results for input(s): TSH, T4TOTAL, T3FREE, THYROIDAB in the last 72 hours.  Invalid input(s): FREET3 ------------------------------------------------------------------------------------------------------------------ No results for input(s): VITAMINB12, FOLATE, FERRITIN, TIBC, IRON, RETICCTPCT in the  last 72 hours.  Coagulation profile  Recent Labs Lab 02/29/16 2343  INR 1.29    No results for input(s): DDIMER in the last 72 hours.  Cardiac Enzymes No results for input(s): CKMB, TROPONINI, MYOGLOBIN in the last 168 hours.  Invalid input(s): CK ------------------------------------------------------------------------------------------------------------------ No results found for: BNP  Inpatient Medications  Scheduled Meds: . potassium chloride  10 mEq Intravenous Q1 Hr x 2  . sodium chloride flush  3 mL Intravenous Q12H   Continuous Infusions: . sodium chloride     PRN Meds:.acetaminophen **OR** acetaminophen, HYDROmorphone (DILAUDID) injection, ondansetron  Micro Results No results found for this or any previous visit (from the past 240 hour(s)).  Radiology Reports Ct Abdomen Pelvis W Contrast  02/29/2016  CLINICAL DATA:  Acute onset of constipation, nausea and vomiting. Initial encounter. EXAM: CT ABDOMEN AND PELVIS WITH CONTRAST TECHNIQUE: Multidetector CT imaging of the abdomen and pelvis was performed using the standard protocol following bolus administration of intravenous contrast. CONTRAST:  118mL ISOVUE-300 IOPAMIDOL (ISOVUE-300) INJECTION 61% COMPARISON:  CT of the lumbar spine performed 01/25/2014, and abdominal radiograph performed earlier today at 2:46 p.m. FINDINGS: The visualized lung bases are clear. A moderate hiatal hernia is noted. The patient's enteric tube is seen coiled within the hiatal hernia, ending about the superior aspect of the hernia. Small volume ascites is noted within the abdomen and pelvis. Nonspecific hypodensities are seen within the liver, measuring up to 2.2 cm in size. Metastatic disease cannot be excluded. The spleen is unremarkable in appearance. The gallbladder is within normal limits. The pancreas and adrenal glands are unremarkable. The kidneys are unremarkable in appearance. There is no evidence of hydronephrosis. No renal or ureteral  stones are seen. No perinephric stranding is appreciated. No free fluid is identified. The small bowel is unremarkable in appearance. The stomach is within normal limits. No acute vascular abnormalities are seen. The appendix is not definitely characterized. The colon is diffusely filled with fluid and air. There is interposition of the hepatic flexure of the colon anterior to the liver. This appears to reflect partial obstruction due to a mass at the distal sigmoid colon, measuring approximately 6.6 x 3.2 x 3.8 cm. Small adjacent nodules are seen, concerning for local spread of disease. No pelvic sidewall lymphadenopathy is seen. The bladder is mildly distended and grossly unremarkable in appearance. A 1.7 cm nodule posterior to the uterus may simply reflect an exophytic fibroid. The uterus is otherwise unremarkable. The ovaries are relatively symmetric. No suspicious adnexal masses are seen. No inguinal lymphadenopathy is seen. No acute osseous abnormalities are identified. The patient is status post lumbar spinal fusion at L5-S1. IMPRESSION: 1. Colonic mass noted at the distal sigmoid colon, measuring 6.6 x 3.2 x 3.8 cm, resulting in partial obstruction. The colon is diffusely filled with fluid and air, more proximally. Findings compatible with primary colonic malignancy. 2. Small adjacent nodules seen, concerning for local spread of disease. No pelvic sidewall lymphadenopathy seen. 3. Small volume ascites within the abdomen and pelvis. 4. Nonspecific hypodensities within the liver, measuring up to 2.2 cm in size. Metastatic disease cannot be excluded. Would correlate with LFTs, and consider further evaluation as deemed clinically appropriate. 5. Moderate hiatal hernia seen. 6. Enteric tube noted coiled  within the hiatal hernia, ending about the superior aspect of the hernia. 7. 1.7 cm nodule posterior to the uterus may simply reflect an exophytic fibroid. Electronically Signed   By: Garald Balding M.D.   On:  02/29/2016 22:18   Dg Abd 2 Views  02/29/2016  CLINICAL DATA:  Constipation and vomit EXAM: ABDOMEN - 2 VIEW COMPARISON:  None. FINDINGS: Supine and upright images obtained. There is generalized bowel dilatation with multiple air-fluid levels. No free air evident. Postoperative changes noted at L5 and S1. There is atelectatic change in lung bases. IMPRESSION: Bowel gas pattern is consistent with obstruction. No free air evident. Atelectasis lung bases, slightly more on the right than on the left. Electronically Signed   By: Lowella Grip III M.D.   On: 02/29/2016 15:15    Time Spent in minutes  30   Jani Gravel M.D on 03/01/2016 at 7:41 AM  Between 7am to 7pm - Pager - [phone]  After 7pm go to [email or web address] - password [email or web address]  [facility] -  Office  [phone]

## 2016-03-01 NOTE — ED Provider Notes (Signed)
CSN: [id number]     Arrival date & time 02/29/16  1435 History   First MD Initiated Contact with Patient 02/29/16 1646     Chief Complaint  Patient presents with  . Constipation     (Consider location/radiation/quality/duration/timing/severity/associated sxs/prior Treatment) Patient is a 60 y.o. female presenting with constipation. The history is provided by the patient.  Constipation Severity:  Severe Time since last bowel movement:  1 week Timing:  Constant Progression:  Worsening Chronicity:  New Context: dehydration and narcotics   Stool description:  None produced Relieved by:  Nothing Worsened by:  Nothing tried Ineffective treatments:  None tried Associated symptoms: vomiting   Associated symptoms: no abdominal pain, no diarrhea and no fever     Past Medical History  Diagnosis Date  . PONV (postoperative nausea and vomiting)   . Headache(784.0)     not since surgery for spinal leak  . Blood dyscrasia     thrombocytosis not on any meds  . Chronic back pain   . Anemia     History  . Essential thrombocytosis (HCC)     pt. denies this diagnosis  . Tachycardia    Past Surgical History  Procedure Laterality Date  . Cyst excision  11/11/2012    lumbar cyst remove  . Maximum access (mas)posterior lumbar interbody fusion (plif) 1 level N/A 01/11/2014    Procedure: FOR MAXIMUM ACCESS (MAS) POSTERIOR LUMBAR INTERBODY FUSION Lumbar Five Sacral One;  Surgeon: Maeola Harman, MD;  Location: [facility];  Service: Neurosurgery;  Laterality: N/A;  FOR MAXIMUM ACCESS (MAS) POSTERIOR LUMBAR INTERBODY FUSION Lumbar Five Sacral One  . Back surgery      x 4 lower back  . Wisdom tooth extraction    . Perm. dental implant      lower back right   Family History  Problem Relation Age of Onset  . Colon cancer Neg Hx   . Colon polyps Neg Hx   . Rectal cancer Neg Hx   . Stomach cancer Neg Hx   . Esophageal cancer Neg Hx   . Heart disease Mother   . Melanoma Mother    Social  History  Substance Use Topics  . Smoking status: Former Smoker -- 0.25 packs/day for 1 years    Types: Cigarettes  . Smokeless tobacco: Never Used  . Alcohol Use: No   OB History    No data available     Review of Systems  Constitutional: Negative for fever.  Gastrointestinal: Positive for vomiting and constipation. Negative for abdominal pain and diarrhea.  All other systems reviewed and are negative.     Allergies  Review of patient's allergies indicates no known allergies.  Home Medications   Prior to Admission medications   Medication Sig Start Date End Date Taking? Authorizing Provider  DULoxetine (CYMBALTA) 60 MG capsule Take 60 mg by mouth daily.   Yes Historical Provider, MD  furosemide (LASIX) 20 MG tablet Take 20 mg by mouth daily.  08/29/15  Yes Historical Provider, MD  HYDROmorphone (DILAUDID) 4 MG tablet Take by mouth every 4 (four) hours as needed for severe pain.   Yes Historical Provider, MD  methadone (DOLOPHINE) 10 MG tablet Take 10 mg by mouth 3 (three) times daily.    Yes Historical Provider, MD  naloxegol oxalate (MOVANTIK) 25 MG TABS tablet Take 25 mg by mouth daily.   Yes Historical Provider, MD  vitamin B-12 (CYANOCOBALAMIN) 1000 MCG tablet Take 1,000 mcg by mouth daily.   Yes Historical  Provider, MD  Vitamin D, Ergocalciferol, (DRISDOL) 50000 UNITS CAPS capsule Take 50,000 Units by mouth every 7 (seven) days.  08/29/15  Yes Historical Provider, MD  Na Sulfate-K Sulfate-Mg Sulf (SUPREP BOWEL PREP) SOLN Take 1 kit by mouth once. Name brand only, suprep as directed, no substitutions 11/29/15   Jerene Bears, MD   BP 141/83 mmHg  Pulse 103  Temp(Src) 99.2 F (37.3 C) (Oral)  Resp 18  Ht '5\' 5"'$  (1.651 m)  Wt 127 lb 1 oz (57.635 kg)  BMI 21.14 kg/m2  SpO2 96% Physical Exam  Constitutional: She is oriented to person, place, and time. She appears well-developed and well-nourished. No distress.  HENT:  Head: Normocephalic.  Eyes: Conjunctivae are normal.   Neck: Neck supple. No tracheal deviation present.  Cardiovascular: Normal rate, regular rhythm and normal heart sounds.   Pulmonary/Chest: Effort normal and breath sounds normal. No respiratory distress.  Abdominal: Soft. She exhibits distension (with tympany). There is tenderness (diffuse). There is no rebound and no guarding.  Neurological: She is alert and oriented to person, place, and time.  Skin: Skin is warm and dry.  Psychiatric: She has a normal mood and affect.  Vitals reviewed.   ED Course  Procedures (including critical care time) Labs Review Labs Reviewed  COMPREHENSIVE METABOLIC PANEL - Abnormal; Notable for the following:    Sodium 133 (*)    Chloride 98 (*)    Glucose, Bld 127 (*)    All other components within normal limits  CBC - Abnormal; Notable for the following:    WBC 14.0 (*)    RBC 5.46 (*)    MCV 72.2 (*)    MCH 22.7 (*)    RDW 16.0 (*)    Platelets 954 (*)    All other components within normal limits  PROTIME-INR - Abnormal; Notable for the following:    Prothrombin Time 16.2 (*)    All other components within normal limits  LIPASE, BLOOD  APTT  LACTIC ACID, PLASMA  URINALYSIS, ROUTINE W REFLEX MICROSCOPIC (NOT AT [facility])  PATHOLOGIST SMEAR REVIEW  CBC  BASIC METABOLIC PANEL  CEA  TYPE AND SCREEN    Imaging Review Ct Abdomen Pelvis W Contrast  02/29/2016  CLINICAL DATA:  Acute onset of constipation, nausea and vomiting. Initial encounter. EXAM: CT ABDOMEN AND PELVIS WITH CONTRAST TECHNIQUE: Multidetector CT imaging of the abdomen and pelvis was performed using the standard protocol following bolus administration of intravenous contrast. CONTRAST:  147m ISOVUE-300 IOPAMIDOL (ISOVUE-300) INJECTION 61% COMPARISON:  CT of the lumbar spine performed 01/25/2014, and abdominal radiograph performed earlier today at 2:46 p.m. FINDINGS: The visualized lung bases are clear. A moderate hiatal hernia is noted. The patient's enteric tube is seen coiled within  the hiatal hernia, ending about the superior aspect of the hernia. Small volume ascites is noted within the abdomen and pelvis. Nonspecific hypodensities are seen within the liver, measuring up to 2.2 cm in size. Metastatic disease cannot be excluded. The spleen is unremarkable in appearance. The gallbladder is within normal limits. The pancreas and adrenal glands are unremarkable. The kidneys are unremarkable in appearance. There is no evidence of hydronephrosis. No renal or ureteral stones are seen. No perinephric stranding is appreciated. No free fluid is identified. The small bowel is unremarkable in appearance. The stomach is within normal limits. No acute vascular abnormalities are seen. The appendix is not definitely characterized. The colon is diffusely filled with fluid and air. There is interposition of the hepatic flexure of  the colon anterior to the liver. This appears to reflect partial obstruction due to a mass at the distal sigmoid colon, measuring approximately 6.6 x 3.2 x 3.8 cm. Small adjacent nodules are seen, concerning for local spread of disease. No pelvic sidewall lymphadenopathy is seen. The bladder is mildly distended and grossly unremarkable in appearance. A 1.7 cm nodule posterior to the uterus may simply reflect an exophytic fibroid. The uterus is otherwise unremarkable. The ovaries are relatively symmetric. No suspicious adnexal masses are seen. No inguinal lymphadenopathy is seen. No acute osseous abnormalities are identified. The patient is status post lumbar spinal fusion at L5-S1. IMPRESSION: 1. Colonic mass noted at the distal sigmoid colon, measuring 6.6 x 3.2 x 3.8 cm, resulting in partial obstruction. The colon is diffusely filled with fluid and air, more proximally. Findings compatible with primary colonic malignancy. 2. Small adjacent nodules seen, concerning for local spread of disease. No pelvic sidewall lymphadenopathy seen. 3. Small volume ascites within the abdomen and  pelvis. 4. Nonspecific hypodensities within the liver, measuring up to 2.2 cm in size. Metastatic disease cannot be excluded. Would correlate with LFTs, and consider further evaluation as deemed clinically appropriate. 5. Moderate hiatal hernia seen. 6. Enteric tube noted coiled within the hiatal hernia, ending about the superior aspect of the hernia. 7. 1.7 cm nodule posterior to the uterus may simply reflect an exophytic fibroid. Electronically Signed   By: Garald Balding M.D.   On: 02/29/2016 22:18   Dg Abd 2 Views  02/29/2016  CLINICAL DATA:  Constipation and vomit EXAM: ABDOMEN - 2 VIEW COMPARISON:  None. FINDINGS: Supine and upright images obtained. There is generalized bowel dilatation with multiple air-fluid levels. No free air evident. Postoperative changes noted at L5 and S1. There is atelectatic change in lung bases. IMPRESSION: Bowel gas pattern is consistent with obstruction. No free air evident. Atelectasis lung bases, slightly more on the right than on the left. Electronically Signed   By: Lowella Grip III M.D.   On: 02/29/2016 15:15   I have personally reviewed and evaluated these images and lab results as part of my medical decision-making.   EKG Interpretation None      MDM   Final diagnoses:  Other specified intestinal obstruction (HCC)  Mass of colon    60 y.o. female presents with clinical signs of obstruction with ongoing constipation, no bowel movements in many days and increasing abdominal distention. XR c/w obstructive pattern. CT ordered for definitive evaluation after NG placed for decompression. Hospitalist was consulted for admission and will see the patient in the emergency department. Dr Hulen Skains of general surgery was consulted regarding colonic mass which is likely underlying etiology and they will see in consultation.    Leo Grosser, MD 03/01/16 [phone]

## 2016-03-02 ENCOUNTER — Inpatient Hospital Stay (HOSPITAL_COMMUNITY): Payer: BLUE CROSS/BLUE SHIELD

## 2016-03-02 LAB — COMPREHENSIVE METABOLIC PANEL
ALBUMIN: 2.8 g/dL — AB (ref 3.5–5.0)
ALT: 16 U/L (ref 14–54)
ANION GAP: 14 (ref 5–15)
AST: 17 U/L (ref 15–41)
Alkaline Phosphatase: 69 U/L (ref 38–126)
BUN: 7 mg/dL (ref 6–20)
CHLORIDE: 106 mmol/L (ref 101–111)
CO2: 18 mmol/L — AB (ref 22–32)
Calcium: 8.7 mg/dL — ABNORMAL LOW (ref 8.9–10.3)
Creatinine, Ser: 0.56 mg/dL (ref 0.44–1.00)
GFR calc Af Amer: >60 mL/min (ref >60)
GFR calc non Af Amer: >60 mL/min (ref >60)
GLUCOSE: 49 mg/dL — AB (ref 65–99)
POTASSIUM: 3.5 mmol/L (ref 3.5–5.1)
SODIUM: 138 mmol/L (ref 135–145)
Total Bilirubin: 0.7 mg/dL (ref 0.3–1.2)
Total Protein: 5.5 g/dL — ABNORMAL LOW (ref 6.5–8.1)

## 2016-03-02 LAB — URINALYSIS, ROUTINE W REFLEX MICROSCOPIC
BILIRUBIN URINE: NEGATIVE
GLUCOSE, UA: 100 mg/dL — AB
HGB URINE DIPSTICK: NEGATIVE
Ketones, ur: >80 mg/dL — AB
Leukocytes, UA: NEGATIVE
Nitrite: NEGATIVE
PROTEIN: NEGATIVE mg/dL
Specific Gravity, Urine: 1.025 (ref 1.005–1.030)
pH: 6.5 (ref 5.0–8.0)

## 2016-03-02 LAB — GLUCOSE, CAPILLARY
GLUCOSE-CAPILLARY: 127 mg/dL — AB (ref 65–99)
Glucose-Capillary: 49 mg/dL — ABNORMAL LOW (ref 65–99)

## 2016-03-02 LAB — CBC
HEMATOCRIT: 32.9 % — AB (ref 36.0–46.0)
Hemoglobin: 9.9 g/dL — ABNORMAL LOW (ref 12.0–15.0)
MCH: 22.2 pg — ABNORMAL LOW (ref 26.0–34.0)
MCHC: 30.1 g/dL (ref 30.0–36.0)
MCV: 73.8 fL — ABNORMAL LOW (ref 78.0–100.0)
PLATELETS: 628 10*3/uL — AB (ref 150–400)
RBC: 4.46 MIL/uL (ref 3.87–5.11)
RDW: 16 % — AB (ref 11.5–15.5)
WBC: 11.2 10*3/uL — AB (ref 4.0–10.5)

## 2016-03-02 LAB — CEA
CEA: 6.9 ng/mL — AB (ref 0.0–4.7)
CEA: 7.1 ng/mL — AB (ref 0.0–4.7)

## 2016-03-02 MED ORDER — ENOXAPARIN SODIUM 40 MG/0.4ML ~~LOC~~ SOLN
40.0000 mg | Freq: Once | SUBCUTANEOUS | Status: AC
  Administered 2016-03-02: 40 mg via SUBCUTANEOUS
  Filled 2016-03-02: qty 0.4

## 2016-03-02 MED ORDER — DEXTROSE 5 % IV SOLN
2.0000 g | INTRAVENOUS | Status: AC
  Administered 2016-03-03: 2 g via INTRAVENOUS
  Filled 2016-03-02: qty 2

## 2016-03-02 MED ORDER — SODIUM CHLORIDE 0.9 % IV SOLN
INTRAVENOUS | Status: DC
  Administered 2016-03-02: 21:00:00 via INTRAVENOUS

## 2016-03-02 MED ORDER — DEXTROSE 5 % IV SOLN
2.0000 g | Freq: Two times a day (BID) | INTRAVENOUS | Status: DC
  Filled 2016-03-02 (×2): qty 2

## 2016-03-02 MED ORDER — POLYETHYLENE GLYCOL 3350 17 GM/SCOOP PO POWD
1.0000 | Freq: Once | ORAL | Status: DC
  Filled 2016-03-02: qty 255

## 2016-03-02 NOTE — Progress Notes (Signed)
I called Dr. Maudie Mercury and spoke with him about placement of NGT from the Xray. He called back after he spoke with radiology and stated that it might just be kinked and to advance it. Myself and Ginger RN advanced the tube and got out about 20 cc. After flushing NGT another 30 cc came out and tube appeared to be working satisfactory. After about 2 hr on low intermittent suction, tube did not appear to be putting out much but did work. Dr Maudie Mercury stated to just continue on with NGT since it did seem to be working. Pt in no distress. Will continue to monitor

## 2016-03-02 NOTE — Progress Notes (Addendum)
Patient ID: Carolyn Barton, female   DOB: 1955-11-27, 60 y.o.   MRN: [id number]                                                                PROGRESS NOTE                                                                                                                                                                                                             Patient Demographics:    Carolyn Barton, is a 60 y.o. female, DOB - 1956/07/21, [id number]  Admit date - 02/29/2016   Admitting Physician Ivor Costa, MD  Outpatient Primary MD for the patient is Marton Redwood, MD  LOS - 3  Outpatient Specialists:  [facility], [facility] neurosurgery  Chief Complaint  Patient presents with  . Constipation       Brief Narrative   60 y.o. female with medical history significant of chronic back pain, thrombocytosis, depression, tachycardia, who presents with nausea, vomiting, abdominal pain and constipation.  Patient reports that she has been having severe constipation, nausea, vomiting and abdominal pain for about one week. Her last bowel movement was a week ago. She has vomited 1 to 2 time each day without blood in the vomitus. She has a diffused abdominal pain, intermittent, 5 out of 10 in severity, nonradiating. It is not aggravated or alleviated by any fractures. The patient denies chest pain, shortness of breath, cough, symptoms of UTI or unilateral weakness. She was given prescription of Movantik by her PCP without help.  ED Course: pt was found to have WBC 14.0, lipase is 17, temperature normal, tachycardia, renal function okay. X-ray showed possible bowel obstruction. Pt is place on tele bed for obs.  # CT abdomen/pelvis showed: 1. colonic mass noted at the distal sigmoid colon, measuring 6.6 x 3.2 x 3.8 cm, resulting in partial obstruction, compatible with primary colonic malignancy, 2. mall adjacent nodules seen, concerning for local spread of disease. No pelvic sidewall lymphadenopathy  seen. 3. Small volume ascites within the abdomen and pelvis. 4. Nonspecific hypodensities within the liver, measuring up to 2.2 cm in size. Metastatic disease cannot be excluded. 5. Moderate hiatal hernia seen. 7. 1.7 cm nodule posterior to the uterus may simply reflect an exophytic fibroid.  Subjective:  Carolyn Barton today denies n/v, abd pain, diarrhea, brbpr, black stool. No bm.  NGT ? Not suctioning, awaiting abd xray to verify placement.      Assessment  & Plan :    Principal Problem:   Mass of colon Active Problems:   Essential thrombocytosis (HCC)   Scoliosis of lumbar spine   Lumbar radiculopathy   Bowel obstruction (HCC)   Depression   Colonic mass   Nausea & vomiting   Abdominal pain   1.  Colonic mass NPO after MN Appreciate surgery input.  Anticipate surgery tomorrow to remove.  Please call oncology in am tomorrow regarding colonic mass, Dr. Benay Spice or another GI oncologist should be available at that time  2. Partial SBO Clear liquid as tolerated.  Cont NGT Xray abdomen to verify placement and then restart with low intermittent suction  3. Anemia Stable Check cbc in am  4. Thrombocytosis (pt was seen by oncologist Dr. Alvy Bimler on 12/06/13. Per dr. Calton Dach note, pt has abnormalities with JAK 2 mutation, suggestive of essential thrombocytosis) Repeat cbc in am  5. . Hypokalemia resolved Replete Check cmp in am  6. N/v resolved for now zofran iv prn     Code Status : FULL  Family Communication  : patient  Disposition Plan  : home  Barriers For Discharge : none  Consults  :  surgery  Procedures  : ngt  DVT Prophylaxis  :  SCD  Lab Results  Component Value Date   PLT 628* 03/02/2016    Antibiotics  :     Anti-infectives    None        Objective:   Filed Vitals:   03/01/16 0608 03/01/16 1435 03/01/16 2214 03/02/16 0506  BP: 142/78 130/71 130/81 139/78  Pulse: 98 82 88 94  Temp: 99.3 F (37.4 C) 98.4 F (36.9 C) 98.4 F  (36.9 C) 97.9 F (36.6 C)  TempSrc: Oral   Oral  Resp: 16 20 16 18   Height:      Weight:      SpO2: 98% 97% 100% 100%    Wt Readings from Last 3 Encounters:  03/01/16 57.635 kg (127 lb 1 oz)  11/29/15 62.596 kg (138 lb)  11/12/15 62.143 kg (137 lb)    No intake or output data in the 24 hours ending 03/02/16 0722   Physical Exam  Awake Alert, Oriented X 3, No new F.N deficits, Normal affect [place].AT,PERRAL Supple Neck,No JVD, No cervical lymphadenopathy appriciated.  Symmetrical Chest wall movement, Good air movement bilaterally, CTAB RRR,No Gallops,Rubs or new Murmurs, No Parasternal Heave +ve B.Sounds, (slightly hyperactive)  Abd Soft, No tenderness, No organomegaly appriciated, No rebound - guarding or rigidity. No Cyanosis, Clubbing or edema, No new Rash or bruise  NGT in place    Data Review:    CBC  Recent Labs Lab 02/29/16 1457 03/01/16 0530 03/02/16 0449  WBC 14.0* 13.0* 11.2*  HGB 12.4 10.2* 9.9*  HCT 39.4 33.7* 32.9*  PLT 954* 727* 628*  MCV 72.2* 73.4* 73.8*  MCH 22.7* 22.2* 22.2*  MCHC 31.5 30.3 30.1  RDW 16.0* 16.0* 16.0*    Chemistries   Recent Labs Lab 02/29/16 1457 03/01/16 0530 03/02/16 0449  NA 133* 138 138  K 3.7 3.2* 3.5  CL 98* 104 106  CO2 23 25 18*  GLUCOSE 127* 87 49*  BUN 12 11 7   CREATININE 0.57 0.54 0.56  CALCIUM 9.5 8.7* 8.7*  AST 28  --  17  ALT 23  --  16  ALKPHOS 88  --  69  BILITOT 0.4  --  0.7   ------------------------------------------------------------------------------------------------------------------ No results for input(s): CHOL, HDL, LDLCALC, TRIG, CHOLHDL, LDLDIRECT in the last 72 hours.  No results found for: HGBA1C ------------------------------------------------------------------------------------------------------------------ No results for input(s): TSH, T4TOTAL, T3FREE, THYROIDAB in the last 72 hours.  Invalid input(s):  FREET3 ------------------------------------------------------------------------------------------------------------------ No results for input(s): VITAMINB12, FOLATE, FERRITIN, TIBC, IRON, RETICCTPCT in the last 72 hours.  Coagulation profile  Recent Labs Lab 02/29/16 2343  INR 1.29    No results for input(s): DDIMER in the last 72 hours.  Cardiac Enzymes No results for input(s): CKMB, TROPONINI, MYOGLOBIN in the last 168 hours.  Invalid input(s): CK ------------------------------------------------------------------------------------------------------------------ No results found for: BNP  Inpatient Medications  Scheduled Meds: . sodium chloride flush  3 mL Intravenous Q12H   Continuous Infusions: . sodium chloride 100 mL/hr at 03/01/16 2052   PRN Meds:.acetaminophen **OR** acetaminophen, HYDROmorphone (DILAUDID) injection, ondansetron  Micro Results No results found for this or any previous visit (from the past 240 hour(s)).  Radiology Reports Ct Abdomen Pelvis W Contrast  02/29/2016  CLINICAL DATA:  Acute onset of constipation, nausea and vomiting. Initial encounter. EXAM: CT ABDOMEN AND PELVIS WITH CONTRAST TECHNIQUE: Multidetector CT imaging of the abdomen and pelvis was performed using the standard protocol following bolus administration of intravenous contrast. CONTRAST:  136mL ISOVUE-300 IOPAMIDOL (ISOVUE-300) INJECTION 61% COMPARISON:  CT of the lumbar spine performed 01/25/2014, and abdominal radiograph performed earlier today at 2:46 p.m. FINDINGS: The visualized lung bases are clear. A moderate hiatal hernia is noted. The patient's enteric tube is seen coiled within the hiatal hernia, ending about the superior aspect of the hernia. Small volume ascites is noted within the abdomen and pelvis. Nonspecific hypodensities are seen within the liver, measuring up to 2.2 cm in size. Metastatic disease cannot be excluded. The spleen is unremarkable in appearance. The gallbladder  is within normal limits. The pancreas and adrenal glands are unremarkable. The kidneys are unremarkable in appearance. There is no evidence of hydronephrosis. No renal or ureteral stones are seen. No perinephric stranding is appreciated. No free fluid is identified. The small bowel is unremarkable in appearance. The stomach is within normal limits. No acute vascular abnormalities are seen. The appendix is not definitely characterized. The colon is diffusely filled with fluid and air. There is interposition of the hepatic flexure of the colon anterior to the liver. This appears to reflect partial obstruction due to a mass at the distal sigmoid colon, measuring approximately 6.6 x 3.2 x 3.8 cm. Small adjacent nodules are seen, concerning for local spread of disease. No pelvic sidewall lymphadenopathy is seen. The bladder is mildly distended and grossly unremarkable in appearance. A 1.7 cm nodule posterior to the uterus may simply reflect an exophytic fibroid. The uterus is otherwise unremarkable. The ovaries are relatively symmetric. No suspicious adnexal masses are seen. No inguinal lymphadenopathy is seen. No acute osseous abnormalities are identified. The patient is status post lumbar spinal fusion at L5-S1. IMPRESSION: 1. Colonic mass noted at the distal sigmoid colon, measuring 6.6 x 3.2 x 3.8 cm, resulting in partial obstruction. The colon is diffusely filled with fluid and air, more proximally. Findings compatible with primary colonic malignancy. 2. Small adjacent nodules seen, concerning for local spread of disease. No pelvic sidewall lymphadenopathy seen. 3. Small volume ascites within the abdomen and pelvis. 4. Nonspecific hypodensities within the liver, measuring up to 2.2 cm in size. Metastatic disease cannot be excluded. Would correlate with LFTs, and  consider further evaluation as deemed clinically appropriate. 5. Moderate hiatal hernia seen. 6. Enteric tube noted coiled within the hiatal hernia, ending  about the superior aspect of the hernia. 7. 1.7 cm nodule posterior to the uterus may simply reflect an exophytic fibroid. Electronically Signed   By: Garald Balding M.D.   On: 02/29/2016 22:18   Dg Abd 2 Views  02/29/2016  CLINICAL DATA:  Constipation and vomit EXAM: ABDOMEN - 2 VIEW COMPARISON:  None. FINDINGS: Supine and upright images obtained. There is generalized bowel dilatation with multiple air-fluid levels. No free air evident. Postoperative changes noted at L5 and S1. There is atelectatic change in lung bases. IMPRESSION: Bowel gas pattern is consistent with obstruction. No free air evident. Atelectasis lung bases, slightly more on the right than on the left. Electronically Signed   By: Lowella Grip III M.D.   On: 02/29/2016 15:15    Time Spent in minutes  30   Jani Gravel M.D on 03/02/2016 at 7:22 AM  Between 7am to 7pm - Pager - [phone]  After 7pm go to [email or web address] - password [facility]  [facility] -  Office  [phone]

## 2016-03-02 NOTE — Progress Notes (Signed)
NGT still not hooked into suction because there appears to be a reason that it is not suctioning. ABD xray delayed due to high volume of tests per Radiology tech. Pt not in distress

## 2016-03-02 NOTE — Progress Notes (Signed)
Have alerted our GI oncology team to patient's situation and consult to follow 03/03/2016

## 2016-03-02 NOTE — Anesthesia Preprocedure Evaluation (Addendum)
Anesthesia Evaluation  Patient identified by MRN, date of birth, ID band Patient awake    Reviewed: Allergy & Precautions, NPO status , Patient's Chart, lab work & pertinent test results  History of Anesthesia Complications (+) PONV and history of anesthetic complications  Airway Mallampati: I  TM Distance: >3 FB Neck ROM: Full    Dental  (+) Dental Advisory Given, Implants   Pulmonary neg pulmonary ROS, former smoker,    breath sounds clear to auscultation       Cardiovascular hypertension, Pt. on medications (-) angina Rhythm:Regular Rate:Normal  12/16 ECHO: EF 65-70%, valves OK   Neuro/Psych  Headaches, Depression Chronic back pain: narcotics    GI/Hepatic Neg liver ROS, N/v with abdominal mass, has NG now   Endo/Other  negative endocrine ROS  Renal/GU negative Renal ROS     Musculoskeletal   Abdominal   Peds  Hematology  (+) Blood dyscrasia (Hb 9.9), ,   Anesthesia Other Findings   Reproductive/Obstetrics                          Anesthesia Physical Anesthesia Plan  ASA: III  Anesthesia Plan: General   Post-op Pain Management:    Induction: Intravenous and Rapid sequence  Airway Management Planned: Oral ETT  Additional Equipment:   Intra-op Plan:   Post-operative Plan: Possible Post-op intubation/ventilation  Informed Consent: I have reviewed the patients History and Physical, chart, labs and discussed the procedure including the risks, benefits and alternatives for the proposed anesthesia with the patient or authorized representative who has indicated his/her understanding and acceptance.   Dental advisory given  Plan Discussed with: Surgeon and CRNA  Anesthesia Plan Comments: (Plan routine monitors, GETA, possible post op ventilation)        Anesthesia Quick Evaluation

## 2016-03-02 NOTE — Progress Notes (Signed)
Pt's NG tube was not suctioning. MD informed, and he advised he would order a CXR. Will continue to monitor.

## 2016-03-02 NOTE — Progress Notes (Signed)
Subjective: Comfortable No flatus  Objective: Vital signs in last 24 hours: Temp:  [97.9 F (36.6 C)-98.4 F (36.9 C)] 97.9 F (36.6 C) (06/04 0506) Pulse Rate:  [82-94] 94 (06/04 0506) Resp:  [16-20] 18 (06/04 0506) BP: (130-139)/(71-81) 139/78 mmHg (06/04 0506) SpO2:  [97 %-100 %] 100 % (06/04 0506) Last BM Date: 02/21/16  Intake/Output from previous day: 06/03 0701 - 06/04 0700 In: 1198.3 [I.V.:1198.3] Out: -  Intake/Output this shift:    Abdomen distended, non tender  Lab Results:   Recent Labs  03/01/16 0530 03/02/16 0449  WBC 13.0* 11.2*  HGB 10.2* 9.9*  HCT 33.7* 32.9*  PLT 727* 628*   BMET  Recent Labs  03/01/16 0530 03/02/16 0449  NA 138 138  K 3.2* 3.5  CL 104 106  CO2 25 18*  GLUCOSE 87 49*  BUN 11 7  CREATININE 0.54 0.56  CALCIUM 8.7* 8.7*   PT/INR  Recent Labs  02/29/16 2343  LABPROT 16.2*  INR 1.29   ABG No results for input(s): PHART, HCO3 in the last 72 hours.  Invalid input(s): PCO2, PO2  Studies/Results: Ct Abdomen Pelvis W Contrast  02/29/2016  CLINICAL DATA:  Acute onset of constipation, nausea and vomiting. Initial encounter. EXAM: CT ABDOMEN AND PELVIS WITH CONTRAST TECHNIQUE: Multidetector CT imaging of the abdomen and pelvis was performed using the standard protocol following bolus administration of intravenous contrast. CONTRAST:  14mL ISOVUE-300 IOPAMIDOL (ISOVUE-300) INJECTION 61% COMPARISON:  CT of the lumbar spine performed 01/25/2014, and abdominal radiograph performed earlier today at 2:46 p.m. FINDINGS: The visualized lung bases are clear. A moderate hiatal hernia is noted. The patient's enteric tube is seen coiled within the hiatal hernia, ending about the superior aspect of the hernia. Small volume ascites is noted within the abdomen and pelvis. Nonspecific hypodensities are seen within the liver, measuring up to 2.2 cm in size. Metastatic disease cannot be excluded. The spleen is unremarkable in appearance. The  gallbladder is within normal limits. The pancreas and adrenal glands are unremarkable. The kidneys are unremarkable in appearance. There is no evidence of hydronephrosis. No renal or ureteral stones are seen. No perinephric stranding is appreciated. No free fluid is identified. The small bowel is unremarkable in appearance. The stomach is within normal limits. No acute vascular abnormalities are seen. The appendix is not definitely characterized. The colon is diffusely filled with fluid and air. There is interposition of the hepatic flexure of the colon anterior to the liver. This appears to reflect partial obstruction due to a mass at the distal sigmoid colon, measuring approximately 6.6 x 3.2 x 3.8 cm. Small adjacent nodules are seen, concerning for local spread of disease. No pelvic sidewall lymphadenopathy is seen. The bladder is mildly distended and grossly unremarkable in appearance. A 1.7 cm nodule posterior to the uterus may simply reflect an exophytic fibroid. The uterus is otherwise unremarkable. The ovaries are relatively symmetric. No suspicious adnexal masses are seen. No inguinal lymphadenopathy is seen. No acute osseous abnormalities are identified. The patient is status post lumbar spinal fusion at L5-S1. IMPRESSION: 1. Colonic mass noted at the distal sigmoid colon, measuring 6.6 x 3.2 x 3.8 cm, resulting in partial obstruction. The colon is diffusely filled with fluid and air, more proximally. Findings compatible with primary colonic malignancy. 2. Small adjacent nodules seen, concerning for local spread of disease. No pelvic sidewall lymphadenopathy seen. 3. Small volume ascites within the abdomen and pelvis. 4. Nonspecific hypodensities within the liver, measuring up to 2.2 cm in  size. Metastatic disease cannot be excluded. Would correlate with LFTs, and consider further evaluation as deemed clinically appropriate. 5. Moderate hiatal hernia seen. 6. Enteric tube noted coiled within the hiatal  hernia, ending about the superior aspect of the hernia. 7. 1.7 cm nodule posterior to the uterus may simply reflect an exophytic fibroid. Electronically Signed   By: Garald Balding M.D.   On: 02/29/2016 22:18   Dg Abd 2 Views  02/29/2016  CLINICAL DATA:  Constipation and vomit EXAM: ABDOMEN - 2 VIEW COMPARISON:  None. FINDINGS: Supine and upright images obtained. There is generalized bowel dilatation with multiple air-fluid levels. No free air evident. Postoperative changes noted at L5 and S1. There is atelectatic change in lung bases. IMPRESSION: Bowel gas pattern is consistent with obstruction. No free air evident. Atelectasis lung bases, slightly more on the right than on the left. Electronically Signed   By: Lowella Grip III M.D.   On: 02/29/2016 15:15    Anti-infectives: Anti-infectives    None      Assessment/Plan: s/p * No surgery found *  Obstructing colon mass  Will need exp lap with colostomy/resection of mass this week.  Hopefully tomorrow by Dr. Brantley Stage depending on the OR schedule CEA is pending I discussed the surgery with the patient and her family in detail  LOS: 1 day    Carolyn Barton A 03/02/2016

## 2016-03-03 ENCOUNTER — Inpatient Hospital Stay (HOSPITAL_COMMUNITY): Payer: BLUE CROSS/BLUE SHIELD | Admitting: Anesthesiology

## 2016-03-03 ENCOUNTER — Encounter (HOSPITAL_COMMUNITY)
Admission: EM | Disposition: A | Discharge status: Home Health | Payer: Self-pay | Source: Home / Self Care | Attending: Internal Medicine

## 2016-03-03 ENCOUNTER — Encounter (HOSPITAL_COMMUNITY): Payer: Self-pay | Admitting: Certified Registered Nurse Anesthetist

## 2016-03-03 ENCOUNTER — Encounter: Payer: Self-pay | Admitting: *Deleted

## 2016-03-03 DIAGNOSIS — C19 Malignant neoplasm of rectosigmoid junction: Secondary | ICD-10-CM

## 2016-03-03 HISTORY — PX: LAPAROTOMY: SHX154

## 2016-03-03 HISTORY — DX: Malignant neoplasm of rectosigmoid junction: C19

## 2016-03-03 HISTORY — PX: BIOPSY: SHX5522

## 2016-03-03 HISTORY — PX: COLOSTOMY: SHX63

## 2016-03-03 LAB — CBC
HCT: 32.6 % — ABNORMAL LOW (ref 36.0–46.0)
HEMATOCRIT: 39.9 % (ref 36.0–46.0)
HEMOGLOBIN: 9.9 g/dL — AB (ref 12.0–15.0)
Hemoglobin: 12.1 g/dL (ref 12.0–15.0)
MCH: 21.8 pg — AB (ref 26.0–34.0)
MCH: 21.6 pg — ABNORMAL LOW (ref 26.0–34.0)
MCHC: 30.4 g/dL (ref 30.0–36.0)
MCHC: 30.3 g/dL (ref 30.0–36.0)
MCV: 71.8 fL — ABNORMAL LOW (ref 78.0–100.0)
MCV: 71.1 fL — AB (ref 78.0–100.0)
Platelets: 480 10*3/uL — ABNORMAL HIGH (ref 150–400)
Platelets: 552 10*3/uL — ABNORMAL HIGH (ref 150–400)
RBC: 4.54 MIL/uL (ref 3.87–5.11)
RBC: 5.61 MIL/uL — AB (ref 3.87–5.11)
RDW: 15.9 % — AB (ref 11.5–15.5)
RDW: 15.8 % — AB (ref 11.5–15.5)
WBC: 10.4 10*3/uL (ref 4.0–10.5)
WBC: 2.8 10*3/uL — AB (ref 4.0–10.5)

## 2016-03-03 LAB — COMPREHENSIVE METABOLIC PANEL
ALBUMIN: 2.9 g/dL — AB (ref 3.5–5.0)
ALK PHOS: 65 U/L (ref 38–126)
ALT: 15 U/L (ref 14–54)
ANION GAP: 9 (ref 5–15)
AST: 15 U/L (ref 15–41)
BUN: <5 mg/dL — ABNORMAL LOW (ref 6–20)
CALCIUM: 8.5 mg/dL — AB (ref 8.9–10.3)
CO2: 24 mmol/L (ref 22–32)
Chloride: 102 mmol/L (ref 101–111)
Creatinine, Ser: 0.47 mg/dL (ref 0.44–1.00)
GFR calc non Af Amer: >60 mL/min (ref >60)
GLUCOSE: 81 mg/dL (ref 65–99)
POTASSIUM: 3.2 mmol/L — AB (ref 3.5–5.1)
SODIUM: 135 mmol/L (ref 135–145)
Total Bilirubin: 0.7 mg/dL (ref 0.3–1.2)
Total Protein: 5.5 g/dL — ABNORMAL LOW (ref 6.5–8.1)

## 2016-03-03 LAB — BASIC METABOLIC PANEL
ANION GAP: 10 (ref 5–15)
BUN: 8 mg/dL (ref 6–20)
CALCIUM: 8 mg/dL — AB (ref 8.9–10.3)
CHLORIDE: 104 mmol/L (ref 101–111)
CO2: 19 mmol/L — AB (ref 22–32)
CREATININE: 0.77 mg/dL (ref 0.44–1.00)
GFR calc non Af Amer: >60 mL/min (ref >60)
Glucose, Bld: 125 mg/dL — ABNORMAL HIGH (ref 65–99)
Potassium: 4.6 mmol/L (ref 3.5–5.1)
SODIUM: 133 mmol/L — AB (ref 135–145)

## 2016-03-03 LAB — PATHOLOGIST SMEAR REVIEW

## 2016-03-03 LAB — SURGICAL PCR SCREEN
MRSA, PCR: NEGATIVE
Staphylococcus aureus: NEGATIVE

## 2016-03-03 LAB — MAGNESIUM: MAGNESIUM: 1.2 mg/dL — AB (ref 1.7–2.4)

## 2016-03-03 LAB — GLUCOSE, CAPILLARY: Glucose-Capillary: 79 mg/dL (ref 65–99)

## 2016-03-03 SURGERY — LAPAROTOMY, EXPLORATORY
Anesthesia: General | Site: Abdomen

## 2016-03-03 MED ORDER — NALOXONE HCL 0.4 MG/ML IJ SOLN: 0.4000 mg | INTRAMUSCULAR | Status: DC | PRN

## 2016-03-03 MED ORDER — FENTANYL CITRATE (PF) 100 MCG/2ML IJ SOLN
INTRAMUSCULAR | Status: DC | PRN
  Administered 2016-03-03 (×2): 50 ug via INTRAVENOUS
  Administered 2016-03-03: 200 ug via INTRAVENOUS
  Administered 2016-03-03 (×2): 100 ug via INTRAVENOUS
  Administered 2016-03-03: 50 ug via INTRAVENOUS

## 2016-03-03 MED ORDER — CHLORHEXIDINE GLUCONATE 0.12 % MT SOLN
15.0000 mL | Freq: Two times a day (BID) | OROMUCOSAL | Status: DC
  Administered 2016-03-03 – 2016-03-15 (×22): 15 mL via OROMUCOSAL
  Filled 2016-03-03 (×18): qty 15

## 2016-03-03 MED ORDER — PROMETHAZINE HCL 25 MG/ML IJ SOLN: 6.2500 mg | INTRAMUSCULAR | Status: DC | PRN

## 2016-03-03 MED ORDER — PROPOFOL 10 MG/ML IV BOLUS
INTRAVENOUS | Status: DC | PRN
  Administered 2016-03-03: 160 mg via INTRAVENOUS
  Administered 2016-03-03 (×2): 40 mg via INTRAVENOUS

## 2016-03-03 MED ORDER — KETOROLAC TROMETHAMINE 30 MG/ML IJ SOLN
30.0000 mg | Freq: Four times a day (QID) | INTRAMUSCULAR | Status: DC | PRN
  Administered 2016-03-03 – 2016-03-05 (×4): 30 mg via INTRAVENOUS
  Filled 2016-03-03 (×4): qty 1

## 2016-03-03 MED ORDER — KETOROLAC TROMETHAMINE 30 MG/ML IJ SOLN
INTRAMUSCULAR | Status: DC | PRN
  Administered 2016-03-03: 30 mg via INTRAVENOUS

## 2016-03-03 MED ORDER — ONDANSETRON HCL 4 MG/2ML IJ SOLN: 4.0000 mg | Freq: Four times a day (QID) | INTRAMUSCULAR | Status: DC | PRN

## 2016-03-03 MED ORDER — 0.9 % SODIUM CHLORIDE (POUR BTL) OPTIME
TOPICAL | Status: DC | PRN
  Administered 2016-03-03 (×2): 1000 mL

## 2016-03-03 MED ORDER — LACTATED RINGERS IV SOLN
INTRAVENOUS | Status: DC
  Administered 2016-03-03 (×4): via INTRAVENOUS

## 2016-03-03 MED ORDER — PHENYLEPHRINE 40 MCG/ML (10ML) SYRINGE FOR IV PUSH (FOR BLOOD PRESSURE SUPPORT)
PREFILLED_SYRINGE | INTRAVENOUS | Status: DC | PRN
  Administered 2016-03-03: 40 ug via INTRAVENOUS

## 2016-03-03 MED ORDER — DIPHENHYDRAMINE HCL 12.5 MG/5ML PO ELIX
12.5000 mg | ORAL_SOLUTION | Freq: Four times a day (QID) | ORAL | Status: DC | PRN
  Filled 2016-03-03: qty 5

## 2016-03-03 MED ORDER — LORAZEPAM 2 MG/ML IJ SOLN
1.0000 mg | INTRAMUSCULAR | Status: DC | PRN
  Administered 2016-03-03 – 2016-03-15 (×3): 1 mg via INTRAVENOUS
  Filled 2016-03-03 (×3): qty 1

## 2016-03-03 MED ORDER — HYDROMORPHONE 1 MG/ML IV SOLN
INTRAVENOUS | Status: AC
  Administered 2016-03-03: 0 mg
  Filled 2016-03-03: qty 25

## 2016-03-03 MED ORDER — HYDROMORPHONE HCL 1 MG/ML IJ SOLN
1.0000 mg | Freq: Once | INTRAMUSCULAR | Status: AC
  Administered 2016-03-03: 1 mg via INTRAVENOUS
  Filled 2016-03-03: qty 1

## 2016-03-03 MED ORDER — SUCCINYLCHOLINE CHLORIDE 200 MG/10ML IV SOSY
PREFILLED_SYRINGE | INTRAVENOUS | Status: DC | PRN
  Administered 2016-03-03: 100 mg via INTRAVENOUS

## 2016-03-03 MED ORDER — DIPHENHYDRAMINE HCL 50 MG/ML IJ SOLN: 12.5000 mg | Freq: Four times a day (QID) | INTRAMUSCULAR | Status: DC | PRN

## 2016-03-03 MED ORDER — METHOCARBAMOL 1000 MG/10ML IJ SOLN
500.0000 mg | Freq: Four times a day (QID) | INTRAVENOUS | Status: DC | PRN
  Administered 2016-03-04 – 2016-03-05 (×2): 500 mg via INTRAVENOUS
  Filled 2016-03-03 (×5): qty 5

## 2016-03-03 MED ORDER — HYDROMORPHONE HCL 1 MG/ML IJ SOLN
INTRAMUSCULAR | Status: AC
  Filled 2016-03-03: qty 1

## 2016-03-03 MED ORDER — DILTIAZEM HCL 100 MG IV SOLR
5.0000 mg/h | INTRAVENOUS | Status: DC
  Administered 2016-03-03: 5 mg/h via INTRAVENOUS
  Filled 2016-03-03 (×2): qty 100

## 2016-03-03 MED ORDER — HYDROMORPHONE 1 MG/ML IV SOLN
INTRAVENOUS | Status: DC
  Administered 2016-03-03: 5.1 mg via INTRAVENOUS
  Administered 2016-03-03: 12:00:00 via INTRAVENOUS

## 2016-03-03 MED ORDER — PROPOFOL 10 MG/ML IV BOLUS
INTRAVENOUS | Status: AC
  Filled 2016-03-03: qty 40

## 2016-03-03 MED ORDER — METOPROLOL TARTRATE 5 MG/5ML IV SOLN
5.0000 mg | INTRAVENOUS | Status: DC | PRN
  Administered 2016-03-03: 5 mg via INTRAVENOUS
  Filled 2016-03-03: qty 5

## 2016-03-03 MED ORDER — MIDAZOLAM HCL 2 MG/2ML IJ SOLN
INTRAMUSCULAR | Status: AC
  Filled 2016-03-03: qty 2

## 2016-03-03 MED ORDER — MIDAZOLAM HCL 2 MG/2ML IJ SOLN: 0.5000 mg | Freq: Once | INTRAMUSCULAR | Status: DC | PRN

## 2016-03-03 MED ORDER — TEMAZEPAM 7.5 MG PO CAPS: 7.5000 mg | ORAL_CAPSULE | Freq: Every evening | ORAL | Status: DC | PRN

## 2016-03-03 MED ORDER — POTASSIUM CHLORIDE 10 MEQ/100ML IV SOLN
10.0000 meq | INTRAVENOUS | Status: AC
  Administered 2016-03-03 (×3): 10 meq via INTRAVENOUS
  Filled 2016-03-03 (×3): qty 100

## 2016-03-03 MED ORDER — MIDAZOLAM HCL 5 MG/5ML IJ SOLN
INTRAMUSCULAR | Status: DC | PRN
  Administered 2016-03-03 (×2): 1 mg via INTRAVENOUS

## 2016-03-03 MED ORDER — LIDOCAINE 2% (20 MG/ML) 5 ML SYRINGE
INTRAMUSCULAR | Status: DC | PRN
  Administered 2016-03-03: 20 mg via INTRAVENOUS

## 2016-03-03 MED ORDER — FENTANYL CITRATE (PF) 250 MCG/5ML IJ SOLN
INTRAMUSCULAR | Status: AC
  Filled 2016-03-03: qty 5

## 2016-03-03 MED ORDER — CETYLPYRIDINIUM CHLORIDE 0.05 % MT LIQD
7.0000 mL | Freq: Two times a day (BID) | OROMUCOSAL | Status: DC
  Administered 2016-03-04 – 2016-03-14 (×19): 7 mL via OROMUCOSAL

## 2016-03-03 MED ORDER — HYDROMORPHONE HCL 1 MG/ML IJ SOLN
0.2500 mg | INTRAMUSCULAR | Status: DC | PRN
  Administered 2016-03-03 (×2): 0.5 mg via INTRAVENOUS

## 2016-03-03 MED ORDER — SODIUM CHLORIDE 0.9% FLUSH: 9.0000 mL | INTRAVENOUS | Status: DC | PRN

## 2016-03-03 MED ORDER — HYDROMORPHONE 1 MG/ML IV SOLN
INTRAVENOUS | Status: DC
  Administered 2016-03-03: 17:00:00 via INTRAVENOUS
  Administered 2016-03-03 – 2016-03-04 (×2): 2.4 mg via INTRAVENOUS
  Administered 2016-03-04: 4.8 mg via INTRAVENOUS
  Administered 2016-03-04: 6 mg via INTRAVENOUS
  Administered 2016-03-04: 3 mg via INTRAVENOUS
  Administered 2016-03-04: 4.2 mg via INTRAVENOUS
  Administered 2016-03-04: 5.4 mg via INTRAVENOUS
  Administered 2016-03-05: 4.2 mg via INTRAVENOUS
  Administered 2016-03-05: 5.4 mg via INTRAVENOUS
  Administered 2016-03-05 (×2): 3.6 mg via INTRAVENOUS
  Administered 2016-03-05: 1.2 mg via INTRAVENOUS
  Administered 2016-03-05: 4.2 mg via INTRAVENOUS
  Administered 2016-03-05: 0.6 mg via INTRAVENOUS
  Administered 2016-03-06: 2.21 mg via INTRAVENOUS
  Administered 2016-03-06: 2.4 mg via INTRAVENOUS
  Administered 2016-03-06: 5.4 mg via INTRAVENOUS
  Administered 2016-03-06: 0.6 mg via INTRAVENOUS
  Administered 2016-03-06: 1.2 mg via INTRAVENOUS
  Administered 2016-03-06: 3 mg via INTRAVENOUS
  Administered 2016-03-06: 4.2 mg via INTRAVENOUS
  Administered 2016-03-07 (×2): 2.4 mg via INTRAVENOUS
  Administered 2016-03-07: 3.4 mg via INTRAVENOUS
  Filled 2016-03-03 (×3): qty 25

## 2016-03-03 MED ORDER — SUGAMMADEX SODIUM 200 MG/2ML IV SOLN
INTRAVENOUS | Status: DC | PRN
  Administered 2016-03-03: 115.2 mg via INTRAVENOUS

## 2016-03-03 MED ORDER — MEPERIDINE HCL 25 MG/ML IJ SOLN: 6.2500 mg | INTRAMUSCULAR | Status: DC | PRN

## 2016-03-03 MED ORDER — VECURONIUM BROMIDE 10 MG IV SOLR
INTRAVENOUS | Status: DC | PRN
  Administered 2016-03-03: 3 mg via INTRAVENOUS
  Administered 2016-03-03 (×2): 2 mg via INTRAVENOUS
  Administered 2016-03-03: 5 mg via INTRAVENOUS

## 2016-03-03 MED ORDER — HYDROMORPHONE HCL 1 MG/ML IJ SOLN
2.0000 mg | INTRAMUSCULAR | Status: DC | PRN
  Administered 2016-03-03: 2 mg via INTRAVENOUS
  Filled 2016-03-03: qty 2

## 2016-03-03 MED ORDER — CEFOTETAN DISODIUM-DEXTROSE 2-2.08 GM-% IV SOLR
INTRAVENOUS | Status: AC
  Filled 2016-03-03: qty 50

## 2016-03-03 MED ORDER — DILTIAZEM LOAD VIA INFUSION
10.0000 mg | Freq: Once | INTRAVENOUS | Status: AC
  Administered 2016-03-03: 10 mg via INTRAVENOUS
  Filled 2016-03-03: qty 10

## 2016-03-03 SURGICAL SUPPLY — 56 items
BLADE SURG ROTATE 9660 (MISCELLANEOUS) ×3 IMPLANT
BNDG GAUZE ELAST 4 BULKY (GAUZE/BANDAGES/DRESSINGS) ×3 IMPLANT
CANISTER SUCTION 2500CC (MISCELLANEOUS) ×4 IMPLANT
CATH ROBINSON RED A/P 14FR (CATHETERS) ×3 IMPLANT
CHLORAPREP W/TINT 26ML (MISCELLANEOUS) ×4 IMPLANT
CONT SPEC 4OZ CLIKSEAL STRL BL (MISCELLANEOUS) ×6 IMPLANT
COVER SURGICAL LIGHT HANDLE (MISCELLANEOUS) ×4 IMPLANT
DRAPE LAPAROSCOPIC ABDOMINAL (DRAPES) ×4 IMPLANT
DRAPE UTILITY XL STRL (DRAPES) ×11 IMPLANT
DRAPE WARM FLUID 44X44 (DRAPE) ×4 IMPLANT
DRSG OPSITE POSTOP 4X10 (GAUZE/BANDAGES/DRESSINGS) ×3 IMPLANT
DRSG OPSITE POSTOP 4X8 (GAUZE/BANDAGES/DRESSINGS) IMPLANT
DRSG PAD ABDOMINAL 8X10 ST (GAUZE/BANDAGES/DRESSINGS) ×6 IMPLANT
ELECT BLADE 6.5 EXT (BLADE) ×4 IMPLANT
ELECT CAUTERY BLADE 6.4 (BLADE) ×8 IMPLANT
ELECT REM PT RETURN 9FT ADLT (ELECTROSURGICAL) ×4
ELECTRODE REM PT RTRN 9FT ADLT (ELECTROSURGICAL) ×2 IMPLANT
GLOVE BIO SURGEON STRL SZ7 (GLOVE) ×3 IMPLANT
GLOVE BIO SURGEON STRL SZ8 (GLOVE) ×8 IMPLANT
GLOVE BIOGEL PI IND STRL 7.0 (GLOVE) ×1 IMPLANT
GLOVE BIOGEL PI IND STRL 7.5 (GLOVE) ×1 IMPLANT
GLOVE BIOGEL PI IND STRL 8 (GLOVE) ×3 IMPLANT
GLOVE BIOGEL PI INDICATOR 7.0 (GLOVE) ×2
GLOVE BIOGEL PI INDICATOR 7.5 (GLOVE) ×2
GLOVE BIOGEL PI INDICATOR 8 (GLOVE) ×2
GLOVE ECLIPSE 7.5 STRL STRAW (GLOVE) ×3 IMPLANT
GOWN STRL REUS W/ TWL LRG LVL3 (GOWN DISPOSABLE) ×12 IMPLANT
GOWN STRL REUS W/ TWL XL LVL3 (GOWN DISPOSABLE) ×2 IMPLANT
GOWN STRL REUS W/TWL LRG LVL3 (GOWN DISPOSABLE) ×24
GOWN STRL REUS W/TWL XL LVL3 (GOWN DISPOSABLE) ×4
IV CATH 14GX2 1/4 (CATHETERS) ×3 IMPLANT
KIT BASIN OR (CUSTOM PROCEDURE TRAY) ×4 IMPLANT
KIT OSTOMY DRAINABLE 2.75 STR (WOUND CARE) ×3 IMPLANT
KIT ROOM TURNOVER OR (KITS) ×4 IMPLANT
LIGASURE IMPACT 36 18CM CVD LR (INSTRUMENTS) ×3 IMPLANT
NS IRRIG 1000ML POUR BTL (IV SOLUTION) ×8 IMPLANT
PACK GENERAL/GYN (CUSTOM PROCEDURE TRAY) ×4 IMPLANT
PAD ARMBOARD 7.5X6 YLW CONV (MISCELLANEOUS) ×4 IMPLANT
PENCIL BUTTON HOLSTER BLD 10FT (ELECTRODE) ×4 IMPLANT
RELOAD PROXIMATE 75MM BLUE (ENDOMECHANICALS) ×4 IMPLANT
RELOAD STAPLE 75 3.8 BLU REG (ENDOMECHANICALS) IMPLANT
SPECIMEN JAR X LARGE (MISCELLANEOUS) ×1 IMPLANT
SPONGE LAP 18X18 X RAY DECT (DISPOSABLE) ×3 IMPLANT
STAPLER PROXIMATE 75MM BLUE (STAPLE) ×3 IMPLANT
STAPLER VISISTAT 35W (STAPLE) ×4 IMPLANT
SUCTION POOLE TIP (SUCTIONS) ×4 IMPLANT
SUT PDS AB 1 CTX 36 (SUTURE) ×6 IMPLANT
SUT PDS AB 1 TP1 96 (SUTURE) IMPLANT
SUT VIC AB 2-0 SH 18 (SUTURE) ×3 IMPLANT
SUT VIC AB 3-0 SH 18 (SUTURE) ×13 IMPLANT
SUT VICRYL AB 2 0 TIES (SUTURE) ×4 IMPLANT
SUT VICRYL AB 3 0 TIES (SUTURE) ×4 IMPLANT
TAPE CLOTH SURG 6X10 WHT LF (GAUZE/BANDAGES/DRESSINGS) ×3 IMPLANT
TOWEL OR 17X26 10 PK STRL BLUE (TOWEL DISPOSABLE) ×8 IMPLANT
TRAY FOLEY CATH 14FRSI W/METER (CATHETERS) ×3 IMPLANT
YANKAUER SUCT BULB TIP NO VENT (SUCTIONS) ×4 IMPLANT

## 2016-03-03 NOTE — Progress Notes (Signed)
After new orders givens, pt. Finally more comfortable, pain level down to 4, on scale of 0 -10. But HR remains up in 150-160's.  Text paged Dr. Eliseo Squires, will await for new orders or return call and continue to monitor.  Alphonzo Lemmings, RN

## 2016-03-03 NOTE — Progress Notes (Signed)
Pt. Being transferred to stepdown 3S 16, report given to Joy, Therapist, sports.  Rapid response RN's will transfer pt.  Alphonzo Lemmings, RN

## 2016-03-03 NOTE — Progress Notes (Signed)
Oncology Nurse Navigator Documentation  Oncology Nurse Navigator Flowsheets 03/03/2016  Navigator Location [facility]  Abnormal Finding Date 02/29/2016  Confirmed Diagnosis Date 03/03/2016  Surgery Date 03/03/2016  Referral from hospital for medical oncology for colon cancer. Going to surgery today. Scheduled for 03/18/16 with Dr. Benay Spice at 2 pm. HIM department notified to enter into EPIC. Will contact patient when she recovers from her surgery with appointment. Confirmed with Dr. Alvy Bimler (saw patient in 2015) that OK for Dr. Benay Spice to see her for this oncology need.

## 2016-03-03 NOTE — Care Management Note (Signed)
Case Management Note  Patient Details  Name: Carolyn Barton MRN: [id number] Date of Birth: [birth year]  Subjective/Objective:                 Spoke with patient and two brothers in room. Patient returned from having colostomy placed this afternoon. Has NGT to wall suction. Lives at home with husband. Will require [place] RN at DC and colostomy supplies.    Action/Plan:  CM to continue to follow for HH/ colostomy needs.  Expected Discharge Date:                  Expected Discharge Plan:  [place]  In-House Referral:     Discharge planning Services  CM Consult  Post Acute Care Choice:  Durable Medical Equipment, Home Health (New colostomy) Choice offered to:     DME Arranged:    DME Agency:     HH Arranged:    HH Agency:     Status of Service:  In process, will continue to follow  Medicare Important Message Given:    Date Medicare IM Given:    Medicare IM give by:    Date Additional Medicare IM Given:    Additional Medicare Important Message give by:     If discussed at [place] of Stay Meetings, dates discussed:    Additional Comments:  Carles Collet, RN 03/03/2016, 3:54 PM

## 2016-03-03 NOTE — Addendum Note (Signed)
Addendum  created 03/03/16 1314 by Bethel Born, CRNA   Modules edited: Anesthesia Events, Narrator   Narrator:  Narrator: Event Log Edited

## 2016-03-03 NOTE — Progress Notes (Signed)
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  Unfortunately, had two failed attempts at colonoscopy due to SVT (11/16) and intolerance to bowel prep (3/17). Cologuard was ordered by GI (unsure if she completed this).  Now, admitted with distal colonic obstruction secondary to obstructing colonic mass with possible lymph node +/- liver metastases.  I visited with patient this morning and provided support.  She is anticipating resection today with need for colostomy.  Has discussed possibility of chemotherapy with Dr. Benay Spice after surgical recovery.  She is comfortable with treatment plan and displaying optimism "in looking forward".  Appreciate care by all involved.  Please call if I can be of assistance.  Will follow peripherally.

## 2016-03-03 NOTE — Progress Notes (Signed)
Initial Nutrition Assessment  DOCUMENTATION CODES:   Not applicable  INTERVENTION:   -RD will follow for diet advancement and supplement as appropriate  NUTRITION DIAGNOSIS:   Inadequate oral intake related to altered GI function as evidenced by NPO status.  GOAL:   Patient will meet greater than or equal to 90% of their needs  MONITOR:   Diet advancement, Labs, Weight trends, Skin, I & O's  REASON FOR ASSESSMENT:   Malnutrition Screening Tool    ASSESSMENT:   Carolyn Barton is a 60 y.o. female with medical history significant of chronic back pain, thrombocytosis, depression, tachycardia, who presents with nausea, vomiting, abdominal pain and constipation.  Pt admitted with colonic mass vs SBO.   S/p Procedure(s) (LRB) on 03/03/16: EXPLORATORY LAPAROTOMY (N/A) BIOPSY OF PERITONEAL NODULE (N/A) DIVERTING SIGMOID COLOSTOMY (N/A)  Pt either in OR or receiving nursing care at time of visit. Unable to complete Nutrition-Focused physical exam at this time.   Pt currently NPO. NGT placed on 02/29/16 and has been connected low, intermittent suction.   Per MD notes, pt with large bowel obstruction for probable distal colon cancer, with potential peritoneal and liver mets. Awaiting oncology consult.   Wt hx reviewed. UBW around 145#. Noted pt has experienced a 7.9% wt loss over the past 3 months, which is significant for time frame.   RD suspects malnutrition, however, unable to confirm at this time. Pt is at high nutritional risk due to recent surgery, NPO status, weight loss, and metastatic disease.   Labs reviewed: K: 3.0.   Diet Order:  Diet NPO time specified  Skin:  Reviewed, no issues  Last BM:  03/03/16  Height:   Ht Readings from Last 1 Encounters:  03/01/16 5\' 5"  (1.651 m)    Weight:   Wt Readings from Last 1 Encounters:  03/01/16 127 lb 1 oz (57.635 kg)    Ideal Body Weight:  56.8 kg  BMI:  Body mass index is 21.14 kg/(m^2).  Estimated Nutritional  Needs:   Kcal:  1700-1900  Protein:  85-100 grams  Fluid:  1.7-1.9 L  EDUCATION NEEDS:   No education needs identified at this time  Breck [place] A. Jimmye Norman, RD, LDN, CDE Pager: [phone] After hours Pager: [phone]

## 2016-03-03 NOTE — Progress Notes (Signed)
HR still up in 160's. Paged Dr. Eliseo Squires & orders for EKG.  Will continue to monitor.  Alphonzo Lemmings, RN

## 2016-03-03 NOTE — Progress Notes (Signed)
Received pt. From PACU after surgery, Dilaudid PCA was max out, and pt. Still in severe abd. Pain, rating it 10 plus.  Paged Dr. Brantley Stage and informed of above, orders received.  Will continue to monitor and carry out orders.  Alphonzo Lemmings, RN

## 2016-03-03 NOTE — Progress Notes (Signed)
Patient ID: Carolyn Barton, female   DOB: [birth year], 60 y.o.   MRN: [id number]                                                                PROGRESS NOTE                                                                                                                                                                                                             Patient Demographics:    Adaisha Harewood, is a 60 y.o. female, DOB - Sep 08, 1956, [id number]  Admit date - 02/29/2016   Admitting Physician Ivor Costa, MD  Outpatient Primary MD for the patient is Marton Redwood, MD  LOS - 4  Outpatient Specialists:  [facility], Dr. Vertell Limber neurosurgery  Chief Complaint  Patient presents with  . Constipation       Brief Narrative   60 y.o. female with medical history significant of chronic back pain, thrombocytosis, depression, tachycardia, who presents with nausea, vomiting, abdominal pain and constipation.  Patient reports that she has been having severe constipation, nausea, vomiting and abdominal pain for about one week. Her last bowel movement was a week ago. She has vomited 1 to 2 time each day without blood in the vomitus. She has a diffused abdominal pain, intermittent, 5 out of 10 in severity, nonradiating. It is not aggravated or alleviated by any fractures. The patient denies chest pain, shortness of breath, cough, symptoms of UTI or unilateral weakness. She was given prescription of Movantik by her PCP without help.   # CT abdomen/pelvis showed: 1. colonic mass noted at the distal sigmoid colon, measuring 6.6 x 3.2 x 3.8 cm, resulting in partial obstruction, compatible with primary colonic malignancy, 2. mall adjacent nodules seen, concerning for local spread of disease. No pelvic sidewall lymphadenopathy seen. 3. Small volume ascites within the abdomen and pelvis. 4. Nonspecific hypodensities within the liver, measuring up to 2.2 cm in size. Metastatic disease cannot be excluded. 5.  Moderate hiatal hernia seen. 7. 1.7 cm nodule posterior to the uterus may simply reflect an exophytic fibroid.    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 Alvy Bimler on 12/06/13. Per dr. Calton Dach note, pt has abnormalities with JAK 2 mutation, suggestive of essential thrombocytosis)    Hypokalemia resolved replace   N/v resolved for now zofran iv prn     Code Status : FULL  Family Communication  : patient  Disposition Plan  : home  Barriers For Discharge : none  Consults  :  surgery  Procedures  : ngt  DVT Prophylaxis  :  SCD  Lab Results  Component Value Date   PLT 480* 03/03/2016    Antibiotics  :     Anti-infectives    Start     Dose/Rate Route Frequency Ordered Stop   03/03/16 0846  cefoTEtan in Dextrose 5% (CEFOTAN) 2-2.08 GM-% IVPB    Comments:  Forte, Lindsi   : cabinet override      03/03/16 0846 03/03/16 2059   03/03/16 0600  cefoTEtan (CEFOTAN) 2 g in dextrose 5 % 50 mL IVPB    Comments:  In OR holding pre op   2 g 100 mL/hr over 30 Minutes Intravenous On call to O.R. 03/02/16 1054 03/03/16 1009   03/02/16 1000  cefoTEtan (CEFOTAN) 2 g in dextrose 5 % 50 mL IVPB  Status:  Discontinued    Comments:  In OR holding pre op   2 g 100 mL/hr over 30 Minutes Intravenous Every 12 hours 03/02/16 0959 03/02/16 1054        Objective:   Filed Vitals:   03/03/16 1245 03/03/16 1311 03/03/16 1327 03/03/16 1425  BP:  146/88 148/88 111/80  Pulse:  102 113 100 80  Temp:   97.7 F (36.5 C) 98.1 F (36.7 C)  TempSrc:   Oral Oral  Resp: 22  24 22   Height:      Weight:      SpO2: 98% 99% 100% 95%    Wt Readings from Last 3 Encounters:  03/01/16 57.635 kg (127 lb 1 oz)  11/29/15 62.596 kg (138 lb)  11/12/15 62.143 kg (137 lb)     Intake/Output Summary (Last 24 hours) at 03/03/16 1534 Last data filed at 03/03/16 1345  Gross per 24 hour  Intake   3350 ml  Output   3195 ml  Net    155 ml     Physical Exam  Awake Alert, appears in pain Symmetrical Chest wall movement, Good air movement bilaterally, CTAB RRR,No Gallops,Rubs or new Murmurs, No Parasternal Heave Colostomy bag in place- output     Data Review:    CBC  Recent Labs Lab 02/29/16 1457 03/01/16 0530 03/02/16 0449 03/03/16 0634  WBC 14.0* 13.0* 11.2* 10.4  HGB 12.4 10.2* 9.9* 9.9*  HCT 39.4 33.7* 32.9* 32.6*  PLT 954* 727* 628* 480*  MCV 72.2* 73.4* 73.8* 71.8*  MCH 22.7* 22.2* 22.2* 21.8*  MCHC 31.5 30.3 30.1 30.4  RDW 16.0* 16.0* 16.0* 15.9*    Chemistries   Recent Labs Lab 02/29/16 1457 03/01/16 0530 03/02/16 0449 03/03/16 0634  NA 133* 138 138 135  K 3.7 3.2* 3.5 3.2*  CL 98* 104  106 102  CO2 23 25 18* 24  GLUCOSE 127* 87 49* 81  BUN 12 11 7  <5*  CREATININE 0.57 0.54 0.56 0.47  CALCIUM 9.5 8.7* 8.7* 8.5*  AST 28  --  17 15  ALT 23  --  16 15  ALKPHOS 88  --  69 65  BILITOT 0.4  --  0.7 0.7   ------------------------------------------------------------------------------------------------------------------ No results for input(s): CHOL, HDL, LDLCALC, TRIG, CHOLHDL, LDLDIRECT in the last 72 hours.  No results found for: HGBA1C ------------------------------------------------------------------------------------------------------------------ No results for input(s): TSH, T4TOTAL, T3FREE, THYROIDAB in the last 72 hours.  Invalid input(s):  FREET3 ------------------------------------------------------------------------------------------------------------------ No results for input(s): VITAMINB12, FOLATE, FERRITIN, TIBC, IRON, RETICCTPCT in the last 72 hours.  Coagulation profile  Recent Labs Lab 02/29/16 2343  INR 1.29    No results for input(s): DDIMER in the last 72 hours.  Cardiac Enzymes No results for input(s): CKMB, TROPONINI, MYOGLOBIN in the last 168 hours.  Invalid input(s): CK ------------------------------------------------------------------------------------------------------------------ No results found for: BNP  Inpatient Medications  Scheduled Meds: . cefoTEtan in Dextrose 5%      . HYDROmorphone      . HYDROmorphone   Intravenous Q4H  . HYDROmorphone      . sodium chloride flush  3 mL Intravenous Q12H   Continuous Infusions: . sodium chloride 100 mL/hr at 03/01/16 2052  . lactated ringers 10 mL/hr at 03/03/16 0850   PRN Meds:.acetaminophen **OR** acetaminophen, diphenhydrAMINE **OR** diphenhydrAMINE, HYDROmorphone (DILAUDID) injection, LORazepam, naloxone **AND** sodium chloride flush, ondansetron (ZOFRAN) IV, temazepam  Micro Results Recent Results (from the past 240 hour(s))  Surgical PCR screen     Status: None   Collection Time: 03/03/16  4:00 AM  Result Value Ref Range Status   MRSA, PCR NEGATIVE NEGATIVE Final   Staphylococcus aureus NEGATIVE NEGATIVE Final    Comment:        The Xpert SA Assay (FDA approved for NASAL specimens in patients over 76 years of age), is one component of a comprehensive surveillance program.  Test performance has been validated by [facility] for patients greater than or equal to 62 year old. It is not intended to diagnose infection nor to guide or monitor treatment.     Radiology Reports Ct Abdomen Pelvis W Contrast  02/29/2016  CLINICAL DATA:  Acute onset of constipation, nausea and vomiting. Initial encounter. EXAM: CT ABDOMEN AND PELVIS WITH  CONTRAST TECHNIQUE: Multidetector CT imaging of the abdomen and pelvis was performed using the standard protocol following bolus administration of intravenous contrast. CONTRAST:  12mL ISOVUE-300 IOPAMIDOL (ISOVUE-300) INJECTION 61% COMPARISON:  CT of the lumbar spine performed 01/25/2014, and abdominal radiograph performed earlier today at 2:46 p.m. FINDINGS: The visualized lung bases are clear. A moderate hiatal hernia is noted. The patient's enteric tube is seen coiled within the hiatal hernia, ending about the superior aspect of the hernia. Small volume ascites is noted within the abdomen and pelvis. Nonspecific hypodensities are seen within the liver, measuring up to 2.2 cm in size. Metastatic disease cannot be excluded. The spleen is unremarkable in appearance. The gallbladder is within normal limits. The pancreas and adrenal glands are unremarkable. The kidneys are unremarkable in appearance. There is no evidence of hydronephrosis. No renal or ureteral stones are seen. No perinephric stranding is appreciated. No free fluid is identified. The small bowel is unremarkable in appearance. The stomach is within normal limits. No acute vascular abnormalities are seen. The appendix is not definitely characterized. The colon is diffusely filled with fluid and  air. There is interposition of the hepatic flexure of the colon anterior to the liver. This appears to reflect partial obstruction due to a mass at the distal sigmoid colon, measuring approximately 6.6 x 3.2 x 3.8 cm. Small adjacent nodules are seen, concerning for local spread of disease. No pelvic sidewall lymphadenopathy is seen. The bladder is mildly distended and grossly unremarkable in appearance. A 1.7 cm nodule posterior to the uterus may simply reflect an exophytic fibroid. The uterus is otherwise unremarkable. The ovaries are relatively symmetric. No suspicious adnexal masses are seen. No inguinal lymphadenopathy is seen. No acute osseous abnormalities  are identified. The patient is status post lumbar spinal fusion at L5-S1. IMPRESSION: 1. Colonic mass noted at the distal sigmoid colon, measuring 6.6 x 3.2 x 3.8 cm, resulting in partial obstruction. The colon is diffusely filled with fluid and air, more proximally. Findings compatible with primary colonic malignancy. 2. Small adjacent nodules seen, concerning for local spread of disease. No pelvic sidewall lymphadenopathy seen. 3. Small volume ascites within the abdomen and pelvis. 4. Nonspecific hypodensities within the liver, measuring up to 2.2 cm in size. Metastatic disease cannot be excluded. Would correlate with LFTs, and consider further evaluation as deemed clinically appropriate. 5. Moderate hiatal hernia seen. 6. Enteric tube noted coiled within the hiatal hernia, ending about the superior aspect of the hernia. 7. 1.7 cm nodule posterior to the uterus may simply reflect an exophytic fibroid. Electronically Signed   By: Garald Balding M.D.   On: 02/29/2016 22:18   Dg Abd 2 Views  03/02/2016  ADDENDUM REPORT: 03/02/2016 12:40 ADDENDUM: NG tube is buckled at the side port and looped back on itself in the distal esophagus. Electronically Signed   By: Rolm Baptise M.D.   On: 03/02/2016 12:40  03/02/2016  CLINICAL DATA:  Bowel obstruction EXAM: ABDOMEN - 2 VIEW COMPARISON:  02/29/2016 FINDINGS: Again noted are dilated large and small bowel loops as seen on prior study, unchanged, concerning for distal obstruction. No free air organomegaly. IMPRESSION: Stable distal obstruction pattern. Electronically Signed: By: Rolm Baptise M.D. On: 03/02/2016 11:44   Dg Abd 2 Views  02/29/2016  CLINICAL DATA:  Constipation and vomit EXAM: ABDOMEN - 2 VIEW COMPARISON:  None. FINDINGS: Supine and upright images obtained. There is generalized bowel dilatation with multiple air-fluid levels. No free air evident. Postoperative changes noted at L5 and S1. There is atelectatic change in lung bases. IMPRESSION: Bowel gas pattern  is consistent with obstruction. No free air evident. Atelectasis lung bases, slightly more on the right than on the left. Electronically Signed   By: Lowella Grip III M.D.   On: 02/29/2016 15:15    Time Spent in minutes  [place] DO on 03/03/2016 at 3:34 PM  Between 7am to 7pm - Pager - [phone]  After 7pm go to [email or web address] - password [facility]  [facility] -  Office  [phone]

## 2016-03-03 NOTE — Progress Notes (Signed)
Day of Surgery  Subjective: Pt in good spirits   Discussed care plan and need for  Colostomy   Objective: Vital signs in last 24 hours: Temp:  [98.4 F (36.9 C)-99.4 F (37.4 C)] 98.6 F (37 C) (06/05 0834) Pulse Rate:  [90-100] 93 (06/05 0834) Resp:  [16-19] 16 (06/05 0834) BP: (146-158)/(85-92) 158/92 mmHg (06/05 0834) SpO2:  [96 %-97 %] 96 % (06/05 0834) Last BM Date: 02/21/16  Intake/Output from previous day: March 31, 2023 0701 - 06/05 0700 In: 1120 [P.O.:120; I.V.:1000] Out: -  Intake/Output this shift:    abdomen  mild distention   Lab Results:   Recent Labs  03-30-16 0449 03/03/16 0634  WBC 11.2* 10.4  HGB 9.9* 9.9*  HCT 32.9* 32.6*  PLT 628* 480*   BMET  Recent Labs  03/30/2016 0449 03/03/16 0634  NA 138 135  K 3.5 3.2*  CL 106 102  CO2 18* 24  GLUCOSE 49* 81  BUN 7 <5*  CREATININE 0.56 0.47  CALCIUM 8.7* 8.5*   PT/INR  Recent Labs  02/29/16 2343  LABPROT 16.2*  INR 1.29   ABG No results for input(s): PHART, HCO3 in the last 72 hours.  Invalid input(s): PCO2, PO2  Studies/Results: Dg Abd 2 Views  2016-03-30  ADDENDUM REPORT: 03/30/16 12:40 ADDENDUM: NG tube is buckled at the side port and looped back on itself in the distal esophagus. Electronically Signed   By: Rolm Baptise M.D.   On: 2016-03-30 12:40  2016/03/30  CLINICAL DATA:  Bowel obstruction EXAM: ABDOMEN - 2 VIEW COMPARISON:  02/29/2016 FINDINGS: Again noted are dilated large and small bowel loops as seen on prior study, unchanged, concerning for distal obstruction. No free air organomegaly. IMPRESSION: Stable distal obstruction pattern. Electronically Signed: By: Rolm Baptise M.D. On: 2016/03/30 11:44    Anti-infectives: Anti-infectives    Start     Dose/Rate Route Frequency Ordered Stop   03/03/16 0846  cefoTEtan in Dextrose 5% (CEFOTAN) 2-2.08 GM-% IVPB    Comments:  Forte, Lindsi   : cabinet override      03/03/16 0846 03/03/16 2059   03/03/16 0600  cefoTEtan (CEFOTAN) 2 g in  dextrose 5 % 50 mL IVPB    Comments:  In OR holding pre op   2 g 100 mL/hr over 30 Minutes Intravenous On call to O.R. March 30, 2016 1054 03/04/16 0559   03-30-16 1000  cefoTEtan (CEFOTAN) 2 g in dextrose 5 % 50 mL IVPB  Status:  Discontinued    Comments:  In OR holding pre op   2 g 100 mL/hr over 30 Minutes Intravenous Every 12 hours 03/30/16 0959 2016-03-30 1054      Assessment/Plan: Large bowel obstruction for probable distal colon cancer Possible peritoneal and liver mets  Recommend Ex lap and possible colectomy.  She will need colostomy  This may be unresectable currently   The procedure was discussed with the patient. partial colectomy discussed with the patient as well as non operative treatments. The risks of operative management include bleeding,  Infection,  Leak of anastamosis,  Ostomy formation, open procedure,  Sepsis,  Abcess,  Hernia,  DVT,  Pulmonary complications,  Cardiovascular  complications,  Injury to ureter,  Bladder,kidney,and anesthesia risks,  And death. The patient understands.  Questions answered.   The success of the procedure is 50-85 % for treating the patients symptoms. They agree to proceed.   LOS: 2 days    Chanese Hartsough A. 03/03/2016

## 2016-03-03 NOTE — Progress Notes (Signed)
Call received from central telemetry that pt. HR was 140-150's sustain.  Pt. Very uncomfortable, hurting in back and abd. Call rapid response nurse to come see pt.  Charge Nurse, Ihor Dow & this RN reposition pt. In bed.  Paged Dr. Brantley Stage to inform of HR and pain.  New orders received, will continue to monitor and carry out orders.  Alphonzo Lemmings, RN

## 2016-03-03 NOTE — Progress Notes (Signed)
Colostomy bag was full and when this RN went to empty it, noticed that it had leaked onto the abd. Dressing a little.  Paged Dr. Brantley Stage and informed of above, orders received to do a wet to dry dressing.  Will continue to monitor and carry out orders.  Alphonzo Lemmings, RN

## 2016-03-03 NOTE — Op Note (Signed)
Preoperative diagnosis: Large bowel obstruction at rectosigmoid junction  Postoperative diagnosis: Large bowel obstruction at proximal rectum consistent with adenocarcinoma and carcinomatosis with multiple liver masses bilateral lobes consistent with metastatic disease  Procedure: Exploratory laparotomy with descending colostomy and creation of mucous fistula with repair of chronic cecal serosal tears  Surgeon: Erroll Luna M.D.  Anesthesia: Gen.  EBL: 50 mL  Specimens: Peritoneal nodules consistent with adenocarcinoma on frozen section  Drains: 14 French red rubber catheter into mucous fistula  Indications for procedure: The patient presents for exploratory laparotomy due to a large bowel obstruction after being admitted of the weekend. CT scan shows a large mass at the rectosigmoid junction causing complete obstruction. She was resuscitated over the weekend with a nasogastric tube and IV fluids. CT scan was reviewed which showed a large obstructing mass at the rectosigmoid junction complete obstruction as well as possible carcinomatosis and possible liver metastases. The patient and husband were present for discussion of operative intervention and the high risk that this was metastatic disease. I explained to the need for colostomy currently have potential chemotherapy and/or radiation therapy depending on findings. I was highly suspicious that this was not resectable explained this to them up front. I felt palate colostomy would be helpful in the subsequent adjuvant chemotherapy and/or radiation therapy depending on findings.The procedure was discussed with the patient. partial colectomy discussed with the patient as well as non operative treatments. The risks of operative management include bleeding,  Infection,  Leak of anastamosis,  Ostomy formation, open procedure,  Sepsis,  Abcess,  Hernia,  DVT,  Pulmonary complications,  Cardiovascular  complications,  Injury to ureter,   Bladder,kidney,and anesthesia risks,  And death. The patient understands.  Questions answered.   The success of the procedure is 50-85 % for treating the patients symptoms. They agree to proceed.    Description of procedure: The patient has permitted in holding area and questions were answered. She was taken back to the operating room and placed supine on the OR table. After induction of general anesthesia, a Foley catheter was placed under sterile conditions and the abdomen was prepped and draped in a sterile fashion. Timeout was done and she received appropriate preoperative antibiotics. A lower midline incision was used just below the umbilicus down to the pubic symphysis. Dissection was carried down to the subcutaneous fatty tissue until the midline of the abdominal wall was encountered. This was opened in the midline. The abdominal cavity was entered. She had a moderate amount of ascites. The large and small bowel are massively dilated consistent with distal obstruction. The small intestine was run from ligament of Treitz to the cecum and appeared distended but viable without signs of obstruction. She did have significant studding of the pelvic peritoneum adjacent to a large obstructing mass which appeared to be at the proximal rectum. The mass was densely adherent to the left pelvic sidewall just below the ovary. The cecum was examined and had significant chronic serosal tears noted. I decompressed the cecum with a pursestring suture and the need to catheter to remove much of the air and then tied the Pershing sutures to close the enterotomy. I then prepared the serosal tears with 3-0 Vicryl. These appeared chronic. I then divided the distal descending colon with a GIA 75 stapling device. I then decompressed this with a pursestring suture and suctioned as well as the distal colon leading down to the pelvic mass. The Pershing sutures on each were tied and closed. Frozen section was taken  of 2 peritoneal  nodules away from the primary mass consistent with carcinomatosis and adenocarcinoma. Upon palpation of the liver, her multiple 1 cm white appearing lesions consistent with metastatic disease. I mobilized the left colon down to the pelvis. I first identified the left ureter and it appeared mildly dilated. I followed the ureter down into the pelvis and the ureter entered the left pelvic sidewall where the cancer was densely adherent to. These 2 structures could not be easily separated. I felt that diversion at this point in time due to her obstruction was most appropriate move given her evidence of metastatic disease involvement of her left pelvic sidewall that would require resection of the ureter. This would offer her no survival advantage it would prolong her postoperative course. She will ultimately require a stent in the left ureter in the near future and chemotherapy given her multiple metastatic sites. The pelvis was irrigated. I reexamined the ascending, colon, transverse colon and descending colons insult no other evidence of serosal tear from distention. In the left lower quadrant circular incision was made and I was able to bring the descending colon that for an end colostomy. I brought up the mucus fistula through this same wound and secured to the skin with 3-0 Vicryl. The end was opened and through and I placed a 14 French red rubber catheter into the mucus fistula given the massive amount of edema at her colostomy site. I then closed the fascia with #1 PDS in the midline and packed the skin edges opened with saline soaked Curlex. The ostomy was matured with 3-0 Vicryl in the mucus fistula was incorporated into this. The red rubber catheter marked the mucus fistula. Appliance was applied. A dry dressing was applied. All final counts are found to be correct. The patient was awoke extubated taken to recovery in satisfactory condition.

## 2016-03-03 NOTE — Progress Notes (Addendum)
Paged regarding patient's HR.  Pain controlled.  Chart review shows h/o suspected SVT when colonoscopy attempted.  Asked for EKG and will try IV BB for now.  BP stable.  May need cardizem bolus if not responding.  Doubt patient could do vagal maneuvers.  Eulogio Bear DO    Addendum:   EKG appears to have p waves, though baseline moving.  Will repeat after BB if HR slows down. Will transfer to SDU and start cardizem gtt with bolus

## 2016-03-03 NOTE — Anesthesia Procedure Notes (Signed)
Procedure Name: Intubation Date/Time: 03/03/2016 9:33 AM Performed by: Bethel Born Pre-anesthesia Checklist: Timeout performed, Patient identified, Emergency Drugs available, Suction available and Patient being monitored Patient Re-evaluated:Patient Re-evaluated prior to inductionOxygen Delivery Method: Circle system utilized Preoxygenation: Pre-oxygenation with 100% oxygen Intubation Type: Rapid sequence and IV induction Laryngoscope Size: Miller and 2 Grade View: Grade I Tube type: Oral Tube size: 7.0 mm Number of attempts: 1 Airway Equipment and Method: Stylet Placement Confirmation: ETT inserted through vocal cords under direct vision,  breath sounds checked- equal and bilateral and positive ETCO2 Secured at: 23 cm Tube secured with: Tape Dental Injury: Teeth and Oropharynx as per pre-operative assessment  Comments: NG tube connected to suction prior to induction.  Stomach decompressed and suction removed.

## 2016-03-03 NOTE — Transfer of Care (Signed)
Immediate Anesthesia Transfer of Care Note  Patient: Carolyn Barton  Procedure(s) Performed: Procedure(s): EXPLORATORY LAPAROTOMY (N/A) BIOPSY OF PERITONEAL NODULE (N/A) DIVERTING SIGMOID COLOSTOMY (N/A)  Patient Location: PACU  Anesthesia Type:General  Level of Consciousness: awake, alert , oriented and patient cooperative  Airway & Oxygen Therapy: Patient Spontanous Breathing and Patient connected to nasal cannula oxygen  Post-op Assessment: Report given to RN and Post -op Vital signs reviewed and stable  Post vital signs: Reviewed and stable  Last Vitals:  Filed Vitals:   03/03/16 0616 03/03/16 0834  BP: 150/88 158/92  Pulse: 90 93  Temp: 36.9 C 37 C  Resp: 16 16    Last Pain:  Filed Vitals:   03/03/16 0836  PainSc: 6       Patients Stated Pain Goal: 2 ([id number])  Complications: No apparent anesthesia complications

## 2016-03-03 NOTE — Anesthesia Postprocedure Evaluation (Signed)
Anesthesia Post Note  Patient: Carolyn Barton  Procedure(s) Performed: Procedure(s) (LRB): EXPLORATORY LAPAROTOMY (N/A) BIOPSY OF PERITONEAL NODULE (N/A) DIVERTING SIGMOID COLOSTOMY (N/A)  Patient location during evaluation: PACU Anesthesia Type: General Level of consciousness: sedated Pain management: pain level controlled Vital Signs Assessment: post-procedure vital signs reviewed and stable Respiratory status: spontaneous breathing Cardiovascular status: stable Postop Assessment: no signs of nausea or vomiting Anesthetic complications: no     Last Vitals:  Filed Vitals:   03/03/16 1233 03/03/16 1245  BP: 151/92   Pulse: 102 102  Temp:    Resp: 23 22    Last Pain:  Filed Vitals:   03/03/16 1246  PainSc: Asleep   Pain Goal: Patients Stated Pain Goal: 2 (03/03/16 0700)               Cristi Gwynn JR,JOHN Mateo Flow

## 2016-03-03 NOTE — Significant Event (Signed)
Rapid Response Event Note  Overview: Time Called: 1600 Arrival Time: [id number] Event Type: Cardiac  Initial Focused Assessment: Patient is sp bowel resection and colostomy.  She is in extreme pain 10/10, back and abdomen.   BP 140/100  ST 140s  RR 18  O2 sat 98% on 2l [place] Lung sounds decreased bases, regular heart tones.  Interventions: Attempting Pain control additional 2mg  Dilaudid IV and increased parameters on PCA dilaudid.  She has also received a dose of Ativan. Heat pad to lower back with some relief.  1800 Patient with increased pain control but HR remain 120-140 BP 119/84 IV lopressor given with effect but HR again increased. 12 lead EKG confirms ST 1900  Cardizem gtt started, 10mg  bolus and gtt at 5mg /hr ST 108 after bolus BP 110/77 During transport to 3s16 HR again 120. Patient continues with improved pain control Rn updated on patient status,  RN to call if assistance needed  Event Summary: Name of Physician Notified: Dr Eliseo Squires at 1600  Name of Consulting Physician Notified: cornett at 1600  Outcome: Transferred (Comment)  Event End Time: 1953  Raliegh Ip

## 2016-03-04 DIAGNOSIS — I959 Hypotension, unspecified: Secondary | ICD-10-CM

## 2016-03-04 LAB — BASIC METABOLIC PANEL
ANION GAP: 10 (ref 5–15)
BUN: 12 mg/dL (ref 6–20)
CHLORIDE: 107 mmol/L (ref 101–111)
CO2: 18 mmol/L — AB (ref 22–32)
Calcium: 7.6 mg/dL — ABNORMAL LOW (ref 8.9–10.3)
Creatinine, Ser: 0.66 mg/dL (ref 0.44–1.00)
GFR calc Af Amer: >60 mL/min (ref >60)
GLUCOSE: 97 mg/dL (ref 65–99)
POTASSIUM: 4.6 mmol/L (ref 3.5–5.1)
Sodium: 135 mmol/L (ref 135–145)

## 2016-03-04 LAB — CBC
HEMATOCRIT: 41 % (ref 36.0–46.0)
HEMOGLOBIN: 13 g/dL (ref 12.0–15.0)
MCH: 22.8 pg — AB (ref 26.0–34.0)
MCHC: 31.7 g/dL (ref 30.0–36.0)
MCV: 72.1 fL — AB (ref 78.0–100.0)
PLATELETS: 461 10*3/uL — AB (ref 150–400)
RBC: 5.69 MIL/uL — AB (ref 3.87–5.11)
RDW: 16.1 % — ABNORMAL HIGH (ref 11.5–15.5)
WBC: 11.7 10*3/uL — AB (ref 4.0–10.5)

## 2016-03-04 LAB — IRON AND TIBC
Iron: 6 ug/dL — ABNORMAL LOW (ref 28–170)
SATURATION RATIOS: 3 % — AB (ref 10.4–31.8)
TIBC: 232 ug/dL — AB (ref 250–450)
UIBC: 226 ug/dL

## 2016-03-04 LAB — MAGNESIUM: Magnesium: 1.8 mg/dL (ref 1.7–2.4)

## 2016-03-04 LAB — FERRITIN: FERRITIN: 23 ng/mL (ref 11–307)

## 2016-03-04 MED ORDER — MAGNESIUM SULFATE 2 GM/50ML IV SOLN
2.0000 g | Freq: Once | INTRAVENOUS | Status: AC
  Administered 2016-03-04: 2 g via INTRAVENOUS
  Filled 2016-03-04: qty 50

## 2016-03-04 MED ORDER — SODIUM CHLORIDE 0.9 % IV BOLUS (SEPSIS)
500.0000 mL | Freq: Once | INTRAVENOUS | Status: AC
  Administered 2016-03-04: 500 mL via INTRAVENOUS

## 2016-03-04 MED ORDER — SODIUM CHLORIDE 0.9 % IV BOLUS (SEPSIS)
1000.0000 mL | Freq: Once | INTRAVENOUS | Status: AC
Start: 2016-03-04 — End: 2016-03-04
  Administered 2016-03-04: 1000 mL via INTRAVENOUS

## 2016-03-04 MED ORDER — DEXTROSE-NACL 5-0.9 % IV SOLN
INTRAVENOUS | Status: DC
  Administered 2016-03-04: 12:00:00 via INTRAVENOUS
  Administered 2016-03-04: 1000 mL via INTRAVENOUS
  Administered 2016-03-05: 23:00:00 via INTRAVENOUS
  Administered 2016-03-05: 1000 mL via INTRAVENOUS
  Administered 2016-03-06 – 2016-03-07 (×2): via INTRAVENOUS

## 2016-03-04 MED ORDER — SODIUM CHLORIDE 0.9 % IV BOLUS (SEPSIS)
1000.0000 mL | Freq: Once | INTRAVENOUS | Status: AC
  Administered 2016-03-04: 1000 mL via INTRAVENOUS

## 2016-03-04 MED ORDER — ENOXAPARIN SODIUM 40 MG/0.4ML ~~LOC~~ SOLN
40.0000 mg | Freq: Every day | SUBCUTANEOUS | Status: DC
Start: 2016-03-04 — End: 2016-03-15
  Administered 2016-03-04 – 2016-03-15 (×12): 40 mg via SUBCUTANEOUS
  Filled 2016-03-04 (×12): qty 0.4

## 2016-03-04 NOTE — Progress Notes (Signed)
Patient ID: Carolyn Barton, female   DOB: [birth year], 60 y.o.   MRN: [id number]     [facility]      [address] [place], [place] [address]    Phone: [phone] FAX: [phone]     Subjective: Pt remains tachycardic.  sBP high 80s.  Denies symptoms.  Minimal NGT output.  Ostomy functioning.   Objective:  Vital signs:  Filed Vitals:   03/04/16 0700 03/04/16 0705 03/04/16 0731 03/04/16 0800  BP: '86/69 88/70 84/71 '$ 87/64  Pulse: 110 112 120 118  Temp:   98.3 F (36.8 C)   TempSrc:   Oral   Resp: '28 29 27 16  '$ Height:      Weight:      SpO2: 97% 97%  97%    Last BM Date: 03/03/16  Intake/Output   Yesterday:  06/05 0701 - 06/06 0700 In: 7353 [I.V.:6966; NG/GT:35; IV Piggyback:850] Out: 2992 [Urine:3205; Emesis/NG output:50; Stool:460; Blood:70] This shift:  Total I/O In: -  Out: 50 [Emesis/NG output:50]   Physical Exam: General: Pt awake/alert/oriented x4 in no acute distress Chest: cta.  CV:  s1s2 rrr, tachy.  Abdomen: Soft.  distended.  Midline wound is c/d/i.  Lt colostomy swollen, pink and viable, stool present.  No evidence of peritonitis.  No incarcerated hernias.    Problem List:   Principal Problem:   Mass of colon Active Problems:   Essential thrombocytosis (HCC)   Scoliosis of lumbar spine   Lumbar radiculopathy   Bowel obstruction (HCC)   Depression   Colonic mass   Nausea & vomiting   Abdominal pain    Results:   Labs: Results for orders placed or performed during the hospital encounter of 02/29/16 (from the past 48 hour(s))  Glucose, capillary     Status: Abnormal   Collection Time: 03/02/16 11:52 AM  Result Value Ref Range   Glucose-Capillary 127 (H) 65 - 99 mg/dL   Comment 1 Notify RN   Urinalysis, Routine w reflex microscopic     Status: Abnormal   Collection Time: 03/02/16  6:52 PM  Result Value Ref Range   Color, Urine YELLOW YELLOW   APPearance CLOUDY (A) CLEAR   Specific  Gravity, Urine 1.025 1.005 - 1.030   pH 6.5 5.0 - 8.0   Glucose, UA 100 (A) NEGATIVE mg/dL   Hgb urine dipstick NEGATIVE NEGATIVE   Bilirubin Urine NEGATIVE NEGATIVE   Ketones, ur >80 (A) NEGATIVE mg/dL   Protein, ur NEGATIVE NEGATIVE mg/dL   Nitrite NEGATIVE NEGATIVE   Leukocytes, UA NEGATIVE NEGATIVE    Comment: MICROSCOPIC NOT DONE ON URINES WITH NEGATIVE PROTEIN, BLOOD, LEUKOCYTES, NITRITE, OR GLUCOSE <1000 mg/dL.  Surgical PCR screen     Status: None   Collection Time: 03/03/16  4:00 AM  Result Value Ref Range   MRSA, PCR NEGATIVE NEGATIVE   Staphylococcus aureus NEGATIVE NEGATIVE    Comment:        The Xpert SA Assay (FDA approved for NASAL specimens in patients over 41 years of age), is one component of a comprehensive surveillance program.  Test performance has been validated by [facility] for patients greater than or equal to 1 year old. It is not intended to diagnose infection nor to guide or monitor treatment.   CBC     Status: Abnormal   Collection Time: 03/03/16  6:34 AM  Result Value Ref Range   WBC 10.4 4.0 - 10.5 K/uL   RBC 4.54 3.87 -  5.11 MIL/uL   Hemoglobin 9.9 (L) 12.0 - 15.0 g/dL   HCT 32.6 (L) 36.0 - 46.0 %   MCV 71.8 (L) 78.0 - 100.0 fL   MCH 21.8 (L) 26.0 - 34.0 pg   MCHC 30.4 30.0 - 36.0 g/dL   RDW 15.9 (H) 11.5 - 15.5 %   Platelets 480 (H) 150 - 400 K/uL  Comprehensive metabolic panel     Status: Abnormal   Collection Time: 03/03/16  6:34 AM  Result Value Ref Range   Sodium 135 135 - 145 mmol/L   Potassium 3.2 (L) 3.5 - 5.1 mmol/L   Chloride 102 101 - 111 mmol/L   CO2 24 22 - 32 mmol/L   Glucose, Bld 81 65 - 99 mg/dL   BUN <5 (L) 6 - 20 mg/dL   Creatinine, Ser 0.47 0.44 - 1.00 mg/dL   Calcium 8.5 (L) 8.9 - 10.3 mg/dL   Total Protein 5.5 (L) 6.5 - 8.1 g/dL   Albumin 2.9 (L) 3.5 - 5.0 g/dL   AST 15 15 - 41 U/L   ALT 15 14 - 54 U/L   Alkaline Phosphatase 65 38 - 126 U/L   Total Bilirubin 0.7 0.3 - 1.2 mg/dL   GFR calc non Af Amer >60  >60 mL/min   GFR calc Af Amer >60 >60 mL/min    Comment: (NOTE) The eGFR has been calculated using the CKD EPI equation. This calculation has not been validated in all clinical situations. eGFR's persistently <60 mL/min signify possible Chronic Kidney Disease.    Anion gap 9 5 - 15  Glucose, capillary     Status: None   Collection Time: 03/03/16  7:48 AM  Result Value Ref Range   Glucose-Capillary 79 65 - 99 mg/dL  CBC     Status: Abnormal   Collection Time: 03/03/16  9:20 PM  Result Value Ref Range   WBC 2.8 (L) 4.0 - 10.5 K/uL   RBC 5.61 (H) 3.87 - 5.11 MIL/uL   Hemoglobin 12.1 12.0 - 15.0 g/dL   HCT 39.9 36.0 - 46.0 %   MCV 71.1 (L) 78.0 - 100.0 fL   MCH 21.6 (L) 26.0 - 34.0 pg   MCHC 30.3 30.0 - 36.0 g/dL   RDW 15.8 (H) 11.5 - 15.5 %   Platelets 552 (H) 150 - 400 K/uL  Basic metabolic panel     Status: Abnormal   Collection Time: 03/03/16 11:06 PM  Result Value Ref Range   Sodium 133 (L) 135 - 145 mmol/L   Potassium 4.6 3.5 - 5.1 mmol/L    Comment: DELTA CHECK NOTED   Chloride 104 101 - 111 mmol/L   CO2 19 (L) 22 - 32 mmol/L   Glucose, Bld 125 (H) 65 - 99 mg/dL   BUN 8 6 - 20 mg/dL   Creatinine, Ser 0.77 0.44 - 1.00 mg/dL   Calcium 8.0 (L) 8.9 - 10.3 mg/dL   GFR calc non Af Amer >60 >60 mL/min   GFR calc Af Amer >60 >60 mL/min    Comment: (NOTE) The eGFR has been calculated using the CKD EPI equation. This calculation has not been validated in all clinical situations. eGFR's persistently <60 mL/min signify possible Chronic Kidney Disease.    Anion gap 10 5 - 15  Magnesium     Status: Abnormal   Collection Time: 03/03/16 11:06 PM  Result Value Ref Range   Magnesium 1.2 (L) 1.7 - 2.4 mg/dL  Ferritin     Status: None  Collection Time: 03/04/16  4:12 AM  Result Value Ref Range   Ferritin 23 11 - 307 ng/mL  Iron and TIBC     Status: Abnormal   Collection Time: 03/04/16  4:12 AM  Result Value Ref Range   Iron 6 (L) 28 - 170 ug/dL   TIBC 232 (L) 250 - 450 ug/dL    Saturation Ratios 3 (L) 10.4 - 31.8 %   UIBC 226 ug/dL    Imaging / Studies: Dg Abd 2 Views  03/02/2016  ADDENDUM REPORT: 03/02/2016 12:40 ADDENDUM: NG tube is buckled at the side port and looped back on itself in the distal esophagus. Electronically Signed   By: Rolm Baptise M.D.   On: 03/02/2016 12:40  03/02/2016  CLINICAL DATA:  Bowel obstruction EXAM: ABDOMEN - 2 VIEW COMPARISON:  02/29/2016 FINDINGS: Again noted are dilated large and small bowel loops as seen on prior study, unchanged, concerning for distal obstruction. No free air organomegaly. IMPRESSION: Stable distal obstruction pattern. Electronically Signed: By: Rolm Baptise M.D. On: 03/02/2016 11:44    Medications / Allergies:  Scheduled Meds: . antiseptic oral rinse  7 mL Mouth Rinse q12n4p  . chlorhexidine  15 mL Mouth Rinse BID  . HYDROmorphone   Intravenous Q4H  . sodium chloride  1,000 mL Intravenous Once  . sodium chloride flush  3 mL Intravenous Q12H   Continuous Infusions:  PRN Meds:.acetaminophen **OR** acetaminophen, diphenhydrAMINE **OR** diphenhydrAMINE, HYDROmorphone (DILAUDID) injection, HYDROmorphone (DILAUDID) injection, ketorolac, LORazepam, methocarbamol (ROBAXIN)  IV, naloxone **AND** sodium chloride flush, ondansetron (ZOFRAN) IV  Antibiotics: Anti-infectives    Start     Dose/Rate Route Frequency Ordered Stop   03/03/16 0846  cefoTEtan in Dextrose 5% (CEFOTAN) 2-2.08 GM-% IVPB    Comments:  Forte, Lindsi   : cabinet override      03/03/16 0846 03/03/16 2059   03/03/16 0600  cefoTEtan (CEFOTAN) 2 g in dextrose 5 % 50 mL IVPB    Comments:  In OR holding pre op   2 g 100 mL/hr over 30 Minutes Intravenous On call to O.R. 03/02/16 1054 03/03/16 1009   03/02/16 1000  cefoTEtan (CEFOTAN) 2 g in dextrose 5 % 50 mL IVPB  Status:  Discontinued    Comments:  In OR holding pre op   2 g 100 mL/hr over 30 Minutes Intravenous Every 12 hours 03/02/16 0959 03/02/16 1054        Assessment/Plan Obstructing  large bowel mass, likely adenocarcinoma with metastasis to liver  POD#1 exploratory laparotomy, descending colostomy, mucus fistula, repair of chronic serosal tears---Dr. Brantley Stage 03/03/16 -continue NGT decompression, bowel rest, ice chips, PCA, WOC consult for teaching -mobilize, IS -BID wet to dry dressing changes -await pathology Hypotension/tachycardia-likely volume related. give 1L saline bolus.  Continue foley and watch UOP.  Will consider changing to fentanyl PCA.  Further management per primary team. FEN-NPO, add D5NS, recheck Mg, await labs VTE prophylaxis-scd, add lovenox Dispo-SDU   Erby Pian, [facility] Surgery Pager [phone](7A-4:30P) For consults and floor pages call [phone](7A-4:30P)  03/04/2016

## 2016-03-04 NOTE — Progress Notes (Signed)
PROGRESS NOTE    Carolyn Barton  [id number] DOB: December 06, 1955 DOA: 02/29/2016 PCP: Marton Redwood, MD   Outpatient Specialists:     Brief Narrative:  60 y.o. female with medical history significant of chronic back pain, thrombocytosis, depression, tachycardia, who presents with nausea, vomiting, abdominal pain and constipation.  Patient reports that she has been having severe constipation, nausea, vomiting and abdominal pain for about one week. Her last bowel movement was a week ago. She has vomited 1 to 2 time each day without blood in the vomitus. She has a diffused abdominal pain, intermittent, 5 out of 10 in severity, nonradiating. It is not aggravated or alleviated by any fractures. The patient denies chest pain, shortness of breath, cough, symptoms of UTI or unilateral weakness. She was given prescription of Movantik by her PCP without help.  The CT abdomen/pelvis showed: 1. colonic mass noted at the distal sigmoid colon, measuring 6.6 x 3.2 x 3.8 cm, resulting in partial obstruction, compatible with primary colonic malignancy, 2. mall adjacent nodules seen, concerning for local spread of disease. No pelvic sidewall lymphadenopathy seen.  S/p resection on 6/5.  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 Alvy Bimler on 12/06/13. Per dr. Calton Dach note, pt has abnormalities with  JAK 2 mutation, suggestive of essential thrombocytosis) -blood thinners once ok with surgery  Hypokalemia  replace  N/v -NG tube in place zofran iv prn  Urinary retention -placed foley Almost 2L out     DVT prophylaxis:  SCD's  Code Status: Full Code   Family Communication: Husband at bedside  Disposition Plan:     Consultants:   surgery  Procedures:   Colonic resection     Subjective: Episode of fast HR last PM Low BP this AM Urinary retention  Objective: Filed Vitals:   03/04/16 0700 03/04/16 0705 03/04/16 0731 03/04/16 0800  BP: 86/69 88/70 84/71  87/64  Pulse: 110 112 120 118  Temp:   98.3 F (36.8 C)   TempSrc:   Oral   Resp: 28 29 27 16   Height:      Weight:      SpO2: 97% 97%  97%    Intake/Output Summary (Last 24 hours) at 03/04/16 0948 Last data filed at 03/04/16 0745  Gross per 24 hour  Intake   7851 ml  Output   3835 ml  Net   4016 ml   Filed Weights   02/29/16 1445 03/01/16 0005  Weight: 61.236 kg (135 lb) 57.635 kg (127 lb 1 oz)    Examination:  General exam: more awake and interactive today Respiratory system: Clear to auscultation. Respiratory effort normal. Cardiovascular system: S1 & S2 heard, tachy-sinus Gastrointestinal system: small amount of stool in  Central nervous system: Alert and oriented. No focal neurological deficits. Skin: No rashes, lesions or ulcers Psychiatry: asking same questions over and over  Data Reviewed: I have personally reviewed following labs and imaging studies  CBC:  Recent Labs Lab 02/29/16 1457 03/01/16 0530 03/02/16 0449 03/03/16 0634 03/03/16 2120  WBC 14.0* 13.0* 11.2* 10.4 2.8*  HGB 12.4 10.2* 9.9* 9.9* 12.1  HCT 39.4 33.7* 32.9* 32.6* 39.9  MCV 72.2* 73.4* 73.8* 71.8* 71.1*  PLT 954* 727* 628* 480* [id number]*   Basic Metabolic Panel:  Recent Labs Lab 02/29/16 1457 03/01/16 0530 03/02/16 0449 03/03/16 0634 03/03/16 2306  NA 133* 138 138 135 133*  K 3.7 3.2*  3.5 3.2* 4.6  CL 98* 104 106 102 104  CO2 23 25 18* 24 19*  GLUCOSE 127* 87 49* 81 125*  BUN 12 11 7  <5* 8  CREATININE 0.57 0.54 0.56 0.47 0.77  CALCIUM 9.5 8.7* 8.7* 8.5* 8.0*  MG  --   --   --   --  1.2*   GFR: Estimated Creatinine Clearance: 68.1 mL/min (by C-G formula based on Cr of 0.77). Liver Function Tests:  Recent Labs Lab 02/29/16 1457 03/02/16 0449 03/03/16 0634  AST 28 17 15   ALT 23 16 15   ALKPHOS 88 69 65  BILITOT 0.4 0.7 0.7  PROT 6.9 5.5* 5.5*  ALBUMIN 3.7 2.8* 2.9*    Recent Labs Lab 02/29/16 1457  LIPASE 17   No results for input(s): AMMONIA in the last 168 hours. Coagulation Profile:  Recent Labs Lab 02/29/16 2343  INR 1.29   Cardiac Enzymes: No results for input(s): CKTOTAL, CKMB, CKMBINDEX, TROPONINI in the last 168 hours. BNP (last 3 results) No results for input(s): PROBNP in the last 8760 hours. HbA1C: No results for input(s): HGBA1C in the last 72 hours. CBG:  Recent Labs Lab 03/01/16 0741 03/02/16 0818 03/02/16 1152 03/03/16 0748  GLUCAP 74 49* 127* 79   Lipid Profile: No results for input(s): CHOL, HDL, LDLCALC, TRIG, CHOLHDL, LDLDIRECT in the last 72 hours. Thyroid Function Tests: No results for input(s): TSH, T4TOTAL, FREET4, T3FREE, THYROIDAB in the last 72 hours. Anemia Panel:  Recent Labs  03/04/16 0412  FERRITIN 23  TIBC 232*  IRON 6*   Urine analysis:    Component Value Date/Time   COLORURINE YELLOW 03/02/2016 1852   APPEARANCEUR CLOUDY* 03/02/2016 1852   LABSPEC 1.025 03/02/2016 1852   PHURINE 6.5 03/02/2016 1852   GLUCOSEU 100* 03/02/2016 1852   HGBUR NEGATIVE 03/02/2016 1852   BILIRUBINUR NEGATIVE 03/02/2016 1852   KETONESUR >80* 03/02/2016 1852   PROTEINUR NEGATIVE 03/02/2016 1852   UROBILINOGEN 1.0 12/18/2012 1754   NITRITE NEGATIVE 03/02/2016 1852   LEUKOCYTESUR NEGATIVE 03/02/2016 1852    ) Recent Results (from the past 240 hour(s))  Surgical PCR screen     Status: None   Collection Time:  03/03/16  4:00 AM  Result Value Ref Range Status   MRSA, PCR NEGATIVE NEGATIVE Final   Staphylococcus aureus NEGATIVE NEGATIVE Final    Comment:        The Xpert SA Assay (FDA approved for NASAL specimens in patients over 74 years of age), is one component of a comprehensive surveillance program.  Test performance has been validated by [facility] for patients greater than or equal to 13 year old. It is not intended to diagnose infection nor to guide or monitor treatment.       Anti-infectives    Start     Dose/Rate Route Frequency Ordered Stop   03/03/16 0846  cefoTEtan in Dextrose 5% (CEFOTAN) 2-2.08 GM-% IVPB    Comments:  Forte, Lindsi   :  cabinet override      03/03/16 0846 03/03/16 2059   03/03/16 0600  cefoTEtan (CEFOTAN) 2 g in dextrose 5 % 50 mL IVPB    Comments:  In OR holding pre op   2 g 100 mL/hr over 30 Minutes Intravenous On call to O.R. 03/02/16 1054 03/03/16 1009   03/02/16 1000  cefoTEtan (CEFOTAN) 2 g in dextrose 5 % 50 mL IVPB  Status:  Discontinued    Comments:  In OR holding pre op   2 g 100 mL/hr over 30 Minutes Intravenous Every 12 hours 03/02/16 0959 03/02/16 1054       Radiology Studies: Dg Abd 2 Views  03/02/2016  ADDENDUM REPORT: 03/02/2016 12:40 ADDENDUM: NG tube is buckled at the side port and looped back on itself in the distal esophagus. Electronically Signed   By: Rolm Baptise M.D.   On: 03/02/2016 12:40  03/02/2016  CLINICAL DATA:  Bowel obstruction EXAM: ABDOMEN - 2 VIEW COMPARISON:  02/29/2016 FINDINGS: Again noted are dilated large and small bowel loops as seen on prior study, unchanged, concerning for distal obstruction. No free air organomegaly. IMPRESSION: Stable distal obstruction pattern. Electronically Signed: By: Rolm Baptise M.D. On: 03/02/2016 11:44        Scheduled Meds: . antiseptic oral rinse  7 mL Mouth Rinse q12n4p  . chlorhexidine  15 mL Mouth Rinse BID  . HYDROmorphone   Intravenous Q4H  . sodium chloride  1,000 mL  Intravenous Once  . sodium chloride flush  3 mL Intravenous Q12H   Continuous Infusions:    LOS: 3 days    Time spent: 35 min    [place], DO [facility] Pager [phone]  If 7PM-7AM, please contact night-coverage [email or web address] Password [facility] 03/04/2016, 9:48 AM

## 2016-03-04 NOTE — Consult Note (Signed)
Urology Consult   Physician requesting consult: Erroll Luna  Reason for consult: Possible ureteral involvement of colon cancer   History of Present Illness: Carolyn Barton is a 60 y.o. female with PMH significant for lumbar radiculopathy, scoliosis, depression, thrombocytosis, and tachycardia who presented to the ED with c/o abdominal pain with N/V and constipation x 1 week.  CT scan revealed a colonic mass at the distal sigmoid colon resulting in partial obstruction compatible with primary colonic malignancy, small adjacent nodules concerning for local spread of disease, non specific hypodensities in the liver, and no hydroureteronephrosis.  WBC 11, Cr 0.56 on admission.   She is s/p colon resection with colostomy 03/03/16 by Dr. Brantley Stage.  During the procedure he noted that the cancer was densely adherent to the left pelvic sidewall where the ureter entered.  The two structures could not be easily separated.  He documented in his op note that he felt she would likely require a left ureteral stent and chemotherapy due to multiple metastatic sites. Final path is pending but it appears to be adenocarcinoma. Oncology is evaluating the pt.   She did not have a foley placed until post operatively at which time she had 1700cc out.  Foley has been in place and functioning well since that time.    She denies a history of voiding or storage urinary symptoms, hematuria,  urolithiasis, GU malignancy/trauma/surgery.  She is currently sleepy but her pain is well controlled.  She denies F/C, HA, CP, SOB, and N/V.  Past Medical History  Diagnosis Date  . PONV (postoperative nausea and vomiting)   . Headache(784.0)     not since surgery for spinal leak  . Blood dyscrasia     thrombocytosis not on any meds  . Chronic back pain   . Anemia     History  . Essential thrombocytosis (HCC)     pt. denies this diagnosis  . Tachycardia     Past Surgical History  Procedure Laterality Date  . Cyst excision   11/11/2012    lumbar cyst remove  . Maximum access (mas)posterior lumbar interbody fusion (plif) 1 level N/A 01/11/2014    Procedure: FOR MAXIMUM ACCESS (MAS) POSTERIOR LUMBAR INTERBODY FUSION Lumbar Five Sacral One;  Surgeon: [name], MD;  Location: [facility];  Service: Neurosurgery;  Laterality: N/A;  FOR MAXIMUM ACCESS (MAS) POSTERIOR LUMBAR INTERBODY FUSION Lumbar Five Sacral One  . Back surgery      x 4 lower back  . Wisdom tooth extraction    . Perm. dental implant      lower back right     Current Hospital Medications:  Home meds:    Medication List    ASK your doctor about these medications        DULoxetine 60 MG capsule  Commonly known as:  CYMBALTA  Take 60 mg by mouth daily.     furosemide 20 MG tablet  Commonly known as:  LASIX  Take 20 mg by mouth daily.     HYDROmorphone 4 MG tablet  Commonly known as:  DILAUDID  Take by mouth every 4 (four) hours as needed for severe pain.     methadone 10 MG tablet  Commonly known as:  DOLOPHINE  Take 10 mg by mouth 3 (three) times daily.     MOVANTIK 25 MG Tabs tablet  Generic drug:  naloxegol oxalate  Take 25 mg by mouth daily.     Na Sulfate-K Sulfate-Mg Sulf 17.5-3.13-1.6 GM/180ML Soln  Commonly known  as:  SUPREP BOWEL PREP KIT  Take 1 kit by mouth once. Name brand only, suprep as directed, no substitutions     vitamin B-12 1000 MCG tablet  Commonly known as:  CYANOCOBALAMIN  Take 1,000 mcg by mouth daily.     Vitamin D (Ergocalciferol) 50000 units Caps capsule  Commonly known as:  DRISDOL  Take 50,000 Units by mouth every 7 (seven) days.        Scheduled Meds: . antiseptic oral rinse  7 mL Mouth Rinse q12n4p  . chlorhexidine  15 mL Mouth Rinse BID  . HYDROmorphone   Intravenous Q4H  . sodium chloride  1,000 mL Intravenous Once  . sodium chloride flush  3 mL Intravenous Q12H   Continuous Infusions:  PRN Meds:.acetaminophen **OR** acetaminophen, diphenhydrAMINE **OR** diphenhydrAMINE,  HYDROmorphone (DILAUDID) injection, HYDROmorphone (DILAUDID) injection, ketorolac, LORazepam, methocarbamol (ROBAXIN)  IV, naloxone **AND** sodium chloride flush, ondansetron (ZOFRAN) IV  Allergies: No Known Allergies  Family History  Problem Relation Age of Onset  . Colon cancer Neg Hx   . Colon polyps Neg Hx   . Rectal cancer Neg Hx   . Stomach cancer Neg Hx   . Esophageal cancer Neg Hx   . Heart disease Mother   . Melanoma Mother     Social History:  reports that she has quit smoking. Her smoking use included Cigarettes. She has a .25 pack-year smoking history. She has never used smokeless tobacco. She reports that she does not drink alcohol or use illicit drugs.  ROS: A complete review of systems was performed.  All systems are negative except for pertinent findings as noted.  Physical Exam:  Vital signs in last 24 hours: Temp:  [97 F (36.1 C)-99.3 F (37.4 C)] 98.3 F (36.8 C) (06/06 0731) Pulse Rate:  [80-161] 118 (06/06 0800) Resp:  [16-33] 16 (06/06 0800) BP: (84-156)/(61-103) 87/64 mmHg (06/06 0800) SpO2:  [94 %-100 %] 97 % (06/06 0800) Constitutional:  Alert and oriented, No acute distress; very sleepy Cardiovascular: Regular rate and rhythm Respiratory: Normal respiratory effort GI: NGT in place; Abdomen is soft, distended, surgical site with C/D dressing; ostomy bag with stool GU: foley in place with pink urine Lymphatic: No lymphadenopathy Neurologic: Grossly intact, no focal deficits Psychiatric: Normal mood and affect  Laboratory Data:   Recent Labs  03/02/16 0449 03/03/16 0634 03/03/16 2120  WBC 11.2* 10.4 2.8*  HGB 9.9* 9.9* 12.1  HCT 32.9* 32.6* 39.9  PLT 628* 480* 552*     Recent Labs  03/02/16 0449 03/03/16 0634 03/03/16 2306  NA 138 135 133*  K 3.5 3.2* 4.6  CL 106 102 104  GLUCOSE 49* 81 125*  BUN 7 <5* 8  CALCIUM 8.7* 8.5* 8.0*  CREATININE 0.56 0.47 0.77     Results for orders placed or performed during the hospital encounter  of 02/29/16 (from the past 24 hour(s))  CBC     Status: Abnormal   Collection Time: 03/03/16  9:20 PM  Result Value Ref Range   WBC 2.8 (L) 4.0 - 10.5 K/uL   RBC 5.61 (H) 3.87 - 5.11 MIL/uL   Hemoglobin 12.1 12.0 - 15.0 g/dL   HCT 39.9 36.0 - 46.0 %   MCV 71.1 (L) 78.0 - 100.0 fL   MCH 21.6 (L) 26.0 - 34.0 pg   MCHC 30.3 30.0 - 36.0 g/dL   RDW 15.8 (H) 11.5 - 15.5 %   Platelets 552 (H) 150 - 400 K/uL  Basic metabolic panel  Status: Abnormal   Collection Time: 03/03/16 11:06 PM  Result Value Ref Range   Sodium 133 (L) 135 - 145 mmol/L   Potassium 4.6 3.5 - 5.1 mmol/L   Chloride 104 101 - 111 mmol/L   CO2 19 (L) 22 - 32 mmol/L   Glucose, Bld 125 (H) 65 - 99 mg/dL   BUN 8 6 - 20 mg/dL   Creatinine, Ser 0.77 0.44 - 1.00 mg/dL   Calcium 8.0 (L) 8.9 - 10.3 mg/dL   GFR calc non Af Amer >60 >60 mL/min   GFR calc Af Amer >60 >60 mL/min   Anion gap 10 5 - 15  Magnesium     Status: Abnormal   Collection Time: 03/03/16 11:06 PM  Result Value Ref Range   Magnesium 1.2 (L) 1.7 - 2.4 mg/dL  Ferritin     Status: None   Collection Time: 03/04/16  4:12 AM  Result Value Ref Range   Ferritin 23 11 - 307 ng/mL  Iron and TIBC     Status: Abnormal   Collection Time: 03/04/16  4:12 AM  Result Value Ref Range   Iron 6 (L) 28 - 170 ug/dL   TIBC 232 (L) 250 - 450 ug/dL   Saturation Ratios 3 (L) 10.4 - 31.8 %   UIBC 226 ug/dL   Recent Results (from the past 240 hour(s))  Surgical PCR screen     Status: None   Collection Time: 03/03/16  4:00 AM  Result Value Ref Range Status   MRSA, PCR NEGATIVE NEGATIVE Final   Staphylococcus aureus NEGATIVE NEGATIVE Final    Comment:        The Xpert SA Assay (FDA approved for NASAL specimens in patients over 57 years of age), is one component of a comprehensive surveillance program.  Test performance has been validated by [facility] for patients greater than or equal to 25 year old. It is not intended to diagnose infection nor to guide or  monitor treatment.     Renal Function:  Recent Labs  02/29/16 1457 03/01/16 0530 03/02/16 0449 03/03/16 0634 03/03/16 2306  CREATININE 0.57 0.54 0.56 0.47 0.77   Estimated Creatinine Clearance: 68.1 mL/min (by C-G formula based on Cr of 0.77).  Radiologic Imaging: Dg Abd 2 Views  03/02/2016  ADDENDUM REPORT: 03/02/2016 12:40 ADDENDUM: NG tube is buckled at the side port and looped back on itself in the distal esophagus. Electronically Signed   By: Rolm Baptise M.D.   On: 03/02/2016 12:40  03/02/2016  CLINICAL DATA:  Bowel obstruction EXAM: ABDOMEN - 2 VIEW COMPARISON:  02/29/2016 FINDINGS: Again noted are dilated large and small bowel loops as seen on prior study, unchanged, concerning for distal obstruction. No free air organomegaly. IMPRESSION: Stable distal obstruction pattern. Electronically Signed: By: Rolm Baptise M.D. On: 03/02/2016 11:44   Imaging: CLINICAL DATA: Acute onset of constipation, nausea and vomiting. Initial encounter.  EXAM: CT ABDOMEN AND PELVIS WITH CONTRAST  TECHNIQUE: Multidetector CT imaging of the abdomen and pelvis was performed using the standard protocol following bolus administration of intravenous contrast.  CONTRAST: 16m ISOVUE-300 IOPAMIDOL (ISOVUE-300) INJECTION 61%  COMPARISON: CT of the lumbar spine performed 01/25/2014, and abdominal radiograph performed earlier today at 2:46 p.m.  FINDINGS: The visualized lung bases are clear. A moderate hiatal hernia is noted. The patient's enteric tube is seen coiled within the hiatal hernia, ending about the superior aspect of the hernia.  Small volume ascites is noted within the abdomen and pelvis.  Nonspecific hypodensities  are seen within the liver, measuring up to 2.2 cm in size. Metastatic disease cannot be excluded. The spleen is unremarkable in appearance. The gallbladder is within normal limits. The pancreas and adrenal glands are unremarkable.  The kidneys are  unremarkable in appearance. There is no evidence of hydronephrosis. No renal or ureteral stones are seen. No perinephric stranding is appreciated.  No free fluid is identified. The small bowel is unremarkable in appearance. The stomach is within normal limits. No acute vascular abnormalities are seen.  The appendix is not definitely characterized. The colon is diffusely filled with fluid and air. There is interposition of the hepatic flexure of the colon anterior to the liver. This appears to reflect partial obstruction due to a mass at the distal sigmoid colon, measuring approximately 6.6 x 3.2 x 3.8 cm.  Small adjacent nodules are seen, concerning for local spread of disease. No pelvic sidewall lymphadenopathy is seen.  The bladder is mildly distended and grossly unremarkable in appearance. A 1.7 cm nodule posterior to the uterus may simply reflect an exophytic fibroid. The uterus is otherwise unremarkable. The ovaries are relatively symmetric. No suspicious adnexal masses are seen. No inguinal lymphadenopathy is seen.  No acute osseous abnormalities are identified. The patient is status post lumbar spinal fusion at L5-S1.  IMPRESSION: 1. Colonic mass noted at the distal sigmoid colon, measuring 6.6 x 3.2 x 3.8 cm, resulting in partial obstruction. The colon is diffusely filled with fluid and air, more proximally. Findings compatible with primary colonic malignancy. 2. Small adjacent nodules seen, concerning for local spread of disease. No pelvic sidewall lymphadenopathy seen. 3. Small volume ascites within the abdomen and pelvis. 4. Nonspecific hypodensities within the liver, measuring up to 2.2 cm in size. Metastatic disease cannot be excluded. Would correlate with LFTs, and consider further evaluation as deemed clinically appropriate. 5. Moderate hiatal hernia seen. 6. Enteric tube noted coiled within the hiatal hernia, ending about the superior aspect of the  hernia. 7. 1.7 cm nodule posterior to the uterus may simply reflect an exophytic fibroid.   Electronically Signed  By: Garald Balding M.D.  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  Dr. Gaynelle Arabian will discuss with Dr. Brantley Stage and Oncology.  She may require a left ureteral stent in the future.    DANCY, AMANDA 03/04/2016, 10:28 AM    Pt seen and examined. We have discussed observation for now, and then placement of left ureteral stent as noted above.

## 2016-03-04 NOTE — Progress Notes (Signed)
Brief discussion with patient yesterday and this morning. She is recovering from surgery. The operative findings are noted.  Full consult follow over the next few days. Outpatient follow-up will be scheduled at the [facility].

## 2016-03-04 NOTE — Consult Note (Signed)
WOC ostomy consult note Stoma type/location: LLQ, end colostomy Stomal assessment/size: aprox. 2" round, budded, pink, moist Peristomal assessment: pouch intact from change at bedside during the night Treatment options for stomal/peristomal skin: NA Output bloody Ostomy pouching:2pc. In place Education provided:  Brother at the bedside with patient, she is sleeping but open eyes and appropriate.  I have left Ostomy journal and other materials for patient today. She still has PCA and NG tube in place.  I will begin teaching with patient and husband this week.  Enrolled patient in [place] program:No  [place] will follow along with you for continued support with ostomy teaching and care [place] RN,CWOCN [id number]

## 2016-03-05 ENCOUNTER — Encounter (HOSPITAL_COMMUNITY): Payer: Self-pay | Admitting: Surgery

## 2016-03-05 ENCOUNTER — Encounter: Payer: Self-pay | Admitting: *Deleted

## 2016-03-05 DIAGNOSIS — D72819 Decreased white blood cell count, unspecified: Secondary | ICD-10-CM

## 2016-03-05 LAB — PREALBUMIN

## 2016-03-05 MED ORDER — SODIUM CHLORIDE 0.9 % IV BOLUS (SEPSIS): 500.0000 mL | Freq: Once | INTRAVENOUS | Status: DC

## 2016-03-05 MED ORDER — SODIUM CHLORIDE 0.9 % IV BOLUS (SEPSIS)
750.0000 mL | Freq: Once | INTRAVENOUS | Status: AC
  Administered 2016-03-05: 750 mL via INTRAVENOUS

## 2016-03-05 MED ORDER — SODIUM CHLORIDE 0.9 % IV BOLUS (SEPSIS)
500.0000 mL | Freq: Once | INTRAVENOUS | Status: AC
  Administered 2016-03-05: 500 mL via INTRAVENOUS

## 2016-03-05 MED ORDER — METOPROLOL TARTRATE 5 MG/5ML IV SOLN
2.5000 mg | Freq: Four times a day (QID) | INTRAVENOUS | Status: DC | PRN
  Administered 2016-03-06 – 2016-03-11 (×4): 2.5 mg via INTRAVENOUS
  Filled 2016-03-05 (×4): qty 5

## 2016-03-05 NOTE — Progress Notes (Signed)
2 Days Post-Op  Subjective: She walked 40 feet with PT today.  HR up to 140 with that.  Ostomy has some gas and a fair amount of stool in it. Her Open wound looks fine and we redressed it.  Objective: Vital signs in last 24 hours: Temp:  [97.5 F (36.4 C)-98.9 F (37.2 C)] 98.9 F (37.2 C) (06/07 0743) Pulse Rate:  [86-124] 122 (06/07 0742) Resp:  [16-30] 22 (06/07 0742) BP: (76-113)/(48-72) 103/62 mmHg (06/07 0742) SpO2:  [89 %-100 %] 96 % (06/07 0742) Last BM Date: 03/03/16 2317 IV NG 500 Stool 500 Urine 550 recorded for the 24 hours. Fluid balance is just 767 cc. BP is better, but she remains tachycardic sats good on [place] and room air Labs:  BMP OK WBC is up H/H is also up Platelets remain elevated  Intake/Output from previous day: 06/06 0701 - 06/07 0700 In: 2317.5 [I.V.:2262.5; IV Piggyback:55] Out: 0923 [Urine:550; Emesis/NG output:500; Stool:500] Intake/Output this shift: Total I/O In: 250 [I.V.:250] Out: 200 [Urine:200]  General appearance: alert, cooperative, no distress and tired and up in chair at exam. Resp: clear to auscultation bilaterally Cardio: sinus tachycardia GI: soft, very sore, sites look fine, I cannot see the ostomy right now, few BS.  Lab Results:   Recent Labs  03/03/16 2120 03/04/16 0959  WBC 2.8* 11.7*  HGB 12.1 13.0  HCT 39.9 41.0  PLT 552* 461*    BMET  Recent Labs  03/03/16 2306 03/04/16 0959  NA 133* 135  K 4.6 4.6  CL 104 107  CO2 19* 18*  GLUCOSE 125* 97  BUN 8 12  CREATININE 0.77 0.66  CALCIUM 8.0* 7.6*   PT/INR No results for input(s): LABPROT, INR in the last 72 hours.   Recent Labs Lab 02/29/16 1457 03/02/16 0449 03/03/16 0634  AST '28 17 15  '$ ALT '23 16 15  '$ ALKPHOS 88 69 65  BILITOT 0.4 0.7 0.7  PROT 6.9 5.5* 5.5*  ALBUMIN 3.7 2.8* 2.9*     Lipase     Component Value Date/Time   LIPASE 17 02/29/2016 1457     Studies/Results: No results found. Prior to Admission medications   Medication Sig  Start Date End Date Taking? Authorizing Provider  DULoxetine (CYMBALTA) 60 MG capsule Take 60 mg by mouth daily.   Yes Historical Provider, MD  furosemide (LASIX) 20 MG tablet Take 20 mg by mouth daily.  08/29/15  Yes Historical Provider, MD  HYDROmorphone (DILAUDID) 4 MG tablet Take by mouth every 4 (four) hours as needed for severe pain.   Yes Historical Provider, MD  methadone (DOLOPHINE) 10 MG tablet Take 10 mg by mouth 3 (three) times daily.    Yes Historical Provider, MD  naloxegol oxalate (MOVANTIK) 25 MG TABS tablet Take 25 mg by mouth daily.   Yes Historical Provider, MD  vitamin B-12 (CYANOCOBALAMIN) 1000 MCG tablet Take 1,000 mcg by mouth daily.   Yes Historical Provider, MD  Vitamin D, Ergocalciferol, (DRISDOL) 50000 UNITS CAPS capsule Take 50,000 Units by mouth every 7 (seven) days.  08/29/15  Yes Historical Provider, MD  Na Sulfate-K Sulfate-Mg Sulf (SUPREP BOWEL PREP) SOLN Take 1 kit by mouth once. Name brand only, suprep as directed, no substitutions 11/29/15   Jerene Bears, MD    Medications: . antiseptic oral rinse  7 mL Mouth Rinse q12n4p  . chlorhexidine  15 mL Mouth Rinse BID  . enoxaparin (LOVENOX) injection  40 mg Subcutaneous Daily  . HYDROmorphone   Intravenous Q4H  .  sodium chloride flush  3 mL Intravenous Q12H   . 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 Brantley Stage 03/03/16 Hypotension/tachycardia - fluid bolus this AM 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 Chronic constipation Hx of depression Hx of essential thrombocytosis FEN-NPO, fluids - clamp NG trail VTE prophylaxis-scd, add lovenox    Plan:  I am going to leave her NG clamped for now and see how she does, add flavors to ice chips.  Wet to dry dressings BID,  I am going to give her a NS bolus this AM.   Check daily weights also, I think she is most likely dry.  Recheck labs in AM, add prealbumin to labs this AM.  D/c foley.     LOS: 4 days    Amber Guthridge 03/05/2016 [phone]

## 2016-03-05 NOTE — Evaluation (Signed)
Physical Therapy Evaluation Patient Details Name: Carolyn Barton MRN: [id number] DOB: August 20, 1956 Today's Date: 03/05/2016   History of Present Illness  Carolyn Barton is a 60 y.o. female with PMH significant for lumbar radiculopathy, scoliosis, depression, thrombocytosis, and tachycardia who presented to the ED with c/o abdominal pain with N/V and constipation x 1 week. CT scan revealed a colonic mass at the distal sigmoid colon resulting in partial obstruction compatible with primary colonic malignancy, small adjacent nodules concerning for local spread of disease, non specific hypodensities in the liver, and no hydroureteronephrosis. WBC 11, Cr 0.56 on admission. She is s/p colon resection with colostomy 03/03/16 by Dr. Brantley Stage. During the procedure he noted that the cancer was densely adherent to the left pelvic sidewall where the ureter entered. The two structures could not be easily separated. He documented in his op note that he felt she would likely require a left ureteral stent and chemotherapy due to multiple metastatic sites. Final path is pending but it appears to be adenocarcinoma. Oncology is evaluating the pt.   Clinical Impression  Pt admitted with above diagnosis. Pt currently with functional limitations due to the deficits listed below (see PT Problem List). Pt was able to ambulate with PT with chair follow. Pt should progress well and just need HHPT at home with suppportive family.  Will follow acutely.  Pt will benefit from skilled PT to increase their independence and safety with mobility to allow discharge to the venue listed below.      Follow Up Recommendations Home health PT;Supervision/Assistance - 24 hour    Equipment Recommendations  Rolling walker with 5" wheels;3in1 (PT)    Recommendations for Other Services       Precautions / Restrictions Precautions Precautions: Fall Restrictions Weight Bearing Restrictions: No      Mobility  Bed Mobility                General bed mobility comments: in chair  Transfers Overall transfer level: Needs assistance Equipment used: Rolling walker (2 wheeled) Transfers: Sit to/from Stand Sit to Stand: Mod assist;+2 physical assistance         General transfer comment: Needed asssit to power up due to pain abdomen.    Ambulation/Gait Ambulation/Gait assistance: Min assist;+2 physical assistance Ambulation Distance (Feet): 50 Feet Assistive device: Rolling walker (2 wheeled) Gait Pattern/deviations: Step-through pattern;Decreased stride length;Antalgic;Trunk flexed   Gait velocity interpretation: Below normal speed for age/gender General Gait Details: Pt was able to ambulate with RW with min assist and cues for step sequencing.  followed pt with chair for safety and pt did need to sit after 50 feet.  Took pt to  a window in the chair so she could see outside.    Stairs            Wheelchair Mobility    Modified Rankin (Stroke Patients Only)       Balance Overall balance assessment: Needs assistance Sitting-balance support: Feet supported;Bilateral upper extremity supported Sitting balance-Leahy Scale: Poor Sitting balance - Comments: needs UE support due to pain abdomen   Standing balance support: Bilateral upper extremity supported;During functional activity Standing balance-Leahy Scale: Poor Standing balance comment: relies on RW for balance                             Pertinent Vitals/Pain Pain Assessment: 0-10 Pain Score: 7  Pain Location: abdomen Pain Descriptors / Indicators: Aching;Grimacing;Guarding Pain Intervention(s): Limited activity within patient's tolerance;Monitored  during session;Premedicated before session;Repositioned used PCA Hr 122-145 bpm.  92% on RA.      Home Living Family/patient expects to be discharged to:: Private residence Living Arrangements: Spouse/significant other Available Help at Discharge: Family;Available 24 hours/day Type  of Home: House Home Access: Stairs to enter Entrance Stairs-Rails: Right;Left;Can reach both Entrance Stairs-Number of Steps: 2 Home Layout: One level Home Equipment: Cane - single point;Shower seat - built in Additional Comments: Pt still drives.     Prior Function Level of Independence: Independent               Hand Dominance   Dominant Hand: Right    Extremity/Trunk Assessment   Upper Extremity Assessment: Defer to OT evaluation           Lower Extremity Assessment: Generalized weakness      Cervical / Trunk Assessment: Normal  Communication   Communication: No difficulties  Cognition Arousal/Alertness: Awake/alert Behavior During Therapy: Anxious Overall Cognitive Status: Within Functional Limits for tasks assessed                      General Comments      Exercises        Assessment/Plan    PT Assessment Patient needs continued PT services  PT Diagnosis Generalized weakness;Acute pain   PT Problem List Decreased balance;Decreased activity tolerance;Decreased mobility;Decreased knowledge of use of DME;Decreased safety awareness;Decreased knowledge of precautions;Pain;Decreased strength  PT Treatment Interventions DME instruction;Gait training;Functional mobility training;Therapeutic activities;Therapeutic exercise;Stair training;Balance training;Patient/family education   PT Goals (Current goals can be found in the Care Plan section) Acute Rehab PT Goals Patient Stated Goal: to go home PT Goal Formulation: With patient Time For Goal Achievement: 03/19/16 Potential to Achieve Goals: Good    Frequency Min 3X/week   Barriers to discharge        Co-evaluation               End of Session Equipment Utilized During Treatment: Gait belt Activity Tolerance: Patient limited by fatigue Patient left: in chair;with call bell/phone within reach;with family/visitor present Nurse Communication: Mobility status         Time:  0912-0943 PT Time Calculation (min) (ACUTE ONLY): 31 min   Charges:    Eval Gait     PT G CodesDenice Barton 03/20/16, 1:03 PM Carolyn Barton,PT Acute Rehabilitation [phone] (pager)

## 2016-03-05 NOTE — Progress Notes (Signed)
Still tachycardic with 750 fluid bolus.  EKG on 03/03/16 confirmed Sinus tachycardia.  Planning to get up and void shortly after foley was d/ced.  I am going to give her another 500 ml bolus and talk with Dr. Doyle Askew.

## 2016-03-05 NOTE — Consult Note (Signed)
WOC ostomy consult note Stoma type/location: LLQ colostomy end stoma Stomal assessment/size: 2 3/4 inches Peristomal assessment: Intact  Output:  Small amount soft brown Ostomy pouching: 2pc.  Education provided: Patient's spouse participated in total appliance change process.  Spouse educated on the 2 piece Hollister system being used.  States he has read the Journal materials provided by [employer], [place].  Steps he directly performed included:  Measuring the stoma, molding of the barrier ring, application of wafer.  Spouse observed all steps involved and all of his questions were answered to his expressed satisfaction.  Patient unable to participate secondary to fatigue of working with PT this morning.  Discussed POC with patient and bedside nurse.  [facility] will continue to follow for additional Ostomy teaching. Thanks, Val Riles MSN, RN, CNS-BC, [employer]

## 2016-03-05 NOTE — Progress Notes (Signed)
PROGRESS NOTE    Carolyn Barton  [id number] DOB: 07-29-1956 DOA: 02/29/2016 PCP: Marton Redwood, MD    Brief Narrative:  60 y.o. female with chronic back pain, thrombocytosis, depression, tachycardia, who presented with nausea, vomiting, abdominal pain and constipation. Imaging studies notable for colonic mass in distal sigmoid colon, measuring 6.6 x 3.2 x 3.8 cm, resulting in partial obstruction, compatible with primary colonic malignancy, small adjacent nodules, concerning for local spread of disease. Pt is now S/p resection on 6/5.  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 Brantley Stage 03/03/16 - plan to leave NGT clamed for now and see how she does  - allow ice chips  - provide analgesia as needed   Hypotension/tachycardia - appears to be reactive - agree with boluses for now - added low dose metoprolol as needed for HR > 110  Leukocytosis - from leukopenia to leukocytosis in 24 hours: 2.8 --> 11.7, no blood work this AM, pt does not want to get stuck again  - she was made aware she may need further sepsis work up if she clinically decompensates  - ? Underlying developing sepsis  - monitor closely in SDU  - will ask for CXR and UA, urine culture, lactic acid in AM  Right hand swelling - ask nurse to reposition line   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 - continue to provide IV analgesia until pt PO and at that time we can resume Methadone   Thrombocytosis, essential  - CBC in AM  Hypokalemia  - resolved   N/V - improving - allow zofran as needed   Urinary retention - placed foley  DVT prophylaxis:  SCD's  Code Status: Full Code  Family Communication: Husband at bedside  Disposition Plan:  Home once cleared by surgery team   Consultants:   surgery  Procedures:   Colonic resection   Subjective: Fast HR  last PM and this AM  Objective: Filed Vitals:   03/05/16 1437 03/05/16 1438 03/05/16 1600 03/05/16 1907  BP: 115/69     Pulse:      Temp:  97.6 F (36.4 C)  98.6 F (37 C)  TempSrc:  Oral  Oral  Resp: 32  36   Height:      Weight:      SpO2:   100%     Intake/Output Summary (Last 24 hours) at 03/05/16 1911 Last data filed at 03/05/16 1800  Gross per 24 hour  Intake 2856.45 ml  Output   1825 ml  Net 1031.45 ml   Filed Weights   02/29/16 1445 03/01/16 0005  Weight: 61.236 kg (135 lb) 57.635 kg (127 lb 1 oz)    Examination:  General exam: more awake and interactive today Respiratory system: Clear to auscultation. Respiratory effort normal. Cardiovascular system: S1 & S2 heard, tachy-sinus Gastrointestinal system: non tender, non distended  Central nervous system: Alert and oriented. No focal neurological deficits.  Data Reviewed: I have personally reviewed following labs and imaging studies  CBC:  Recent Labs Lab 03/01/16 0530 03/02/16 0449 03/03/16 0634 03/03/16 2120 03/04/16 0959  WBC 13.0* 11.2* 10.4 2.8* 11.7*  HGB 10.2* 9.9* 9.9* 12.1 13.0  HCT 33.7* 32.9* 32.6* 39.9 41.0  MCV 73.4* 73.8* 71.8* 71.1* 72.1*  PLT 727* 628* 480* 552* 123456*   Basic Metabolic Panel:  Recent Labs Lab 03/01/16 0530 03/02/16 0449 03/03/16 0634 03/03/16 2306 03/04/16  0959  NA 138 138 135 133* 135  K 3.2* 3.5 3.2* 4.6 4.6  CL 104 106 102 104 107  CO2 25 18* 24 19* 18*  GLUCOSE 87 49* 81 125* 97  BUN 11 7 <5* 8 12  CREATININE 0.54 0.56 0.47 0.77 0.66  CALCIUM 8.7* 8.7* 8.5* 8.0* 7.6*  MG  --   --   --  1.2* 1.8   GFR: Estimated Creatinine Clearance: 68.1 mL/min (by C-G formula based on Cr of 0.66). Liver Function Tests:  Recent Labs Lab 02/29/16 1457 03/02/16 0449 03/03/16 0634  AST 28 17 15   ALT 23 16 15   ALKPHOS 88 69 65  BILITOT 0.4 0.7 0.7  PROT 6.9 5.5* 5.5*  ALBUMIN 3.7 2.8* 2.9*    Recent Labs Lab 02/29/16 1457  LIPASE 17   No results for  input(s): AMMONIA in the last 168 hours. Coagulation Profile:  Recent Labs Lab 02/29/16 2343  INR 1.29   CBG:  Recent Labs Lab 03/01/16 0741 03/02/16 0818 03/02/16 1152 03/03/16 0748  GLUCAP 74 49* 127* 79   Anemia Panel:  Recent Labs  03/04/16 0412  FERRITIN 23  TIBC 232*  IRON 6*   Urine analysis:    Component Value Date/Time   COLORURINE YELLOW 03/02/2016 1852   APPEARANCEUR CLOUDY* 03/02/2016 1852   LABSPEC 1.025 03/02/2016 1852   PHURINE 6.5 03/02/2016 1852   GLUCOSEU 100* 03/02/2016 1852   HGBUR NEGATIVE 03/02/2016 1852   BILIRUBINUR NEGATIVE 03/02/2016 1852   KETONESUR >80* 03/02/2016 1852   PROTEINUR NEGATIVE 03/02/2016 1852   UROBILINOGEN 1.0 12/18/2012 1754   NITRITE NEGATIVE 03/02/2016 1852   LEUKOCYTESUR NEGATIVE 03/02/2016 1852    ) Recent Results (from the past 240 hour(s))  Surgical PCR screen     Status: None   Collection Time: 03/03/16  4:00 AM  Result Value Ref Range Status   MRSA, PCR NEGATIVE NEGATIVE Final   Staphylococcus aureus NEGATIVE NEGATIVE Final    Comment:        The Xpert SA Assay (FDA approved for NASAL specimens in patients over 37 years of age), is one component of a comprehensive surveillance program.  Test performance has been validated by [facility] for patients greater than or equal to 31 year old. It is not intended to diagnose infection nor to guide or monitor treatment.       Anti-infectives    Start     Dose/Rate Route Frequency Ordered Stop   03/03/16 0846  cefoTEtan in Dextrose 5% (CEFOTAN) 2-2.08 GM-% IVPB    Comments:  Forte, Lindsi   : cabinet override      03/03/16 0846 03/03/16 2059   03/03/16 0600  cefoTEtan (CEFOTAN) 2 g in dextrose 5 % 50 mL IVPB    Comments:  In OR holding pre op   2 g 100 mL/hr over 30 Minutes Intravenous On call to O.R. 03/02/16 1054 03/03/16 1009   03/02/16 1000  cefoTEtan (CEFOTAN) 2 g in dextrose 5 % 50 mL IVPB  Status:  Discontinued    Comments:  In OR holding pre  op   2 g 100 mL/hr over 30 Minutes Intravenous Every 12 hours 03/02/16 0959 03/02/16 1054     Radiology Studies: No results found.  Scheduled Meds: . antiseptic oral rinse  7 mL Mouth Rinse q12n4p  . chlorhexidine  15 mL Mouth Rinse BID  . enoxaparin (LOVENOX) injection  40 mg Subcutaneous Daily  . HYDROmorphone   Intravenous Q4H  . sodium chloride  500 mL Intravenous Once  . sodium chloride flush  3 mL Intravenous Q12H   Continuous Infusions: . dextrose 5 % and 0.9% NaCl 1,000 mL (03/05/16 0427)     LOS: 4 days    Time spent: 35 min  MAGICK-Kerin Kren, DO [facility] Pager [phone]  If 7PM-7AM, please contact night-coverage [email or web address] Password [email or web address] 03/05/2016, 7:11 PM

## 2016-03-05 NOTE — Progress Notes (Signed)
Email request to pathology to send case out for [facility] testing:  Patient: Carolyn Barton, Carolyn Barton Collected: 03/03/2016 Client: Connell Accession: [id number] Received: 03/03/2016 Erroll Luna, MD DOB: 11/07/1955 Age: 60 Gender: F Reported: 03/04/2016 [address] Patient Ph: [phone] MRN #: [id number] [place], [place] [address] Visit #: [id number] Chart #: Phone: [phone] Fax: CC: Ivor Costa, MD REPORT OF SURGICAL

## 2016-03-06 ENCOUNTER — Inpatient Hospital Stay (HOSPITAL_COMMUNITY): Payer: BLUE CROSS/BLUE SHIELD

## 2016-03-06 DIAGNOSIS — K5660 Unspecified intestinal obstruction: Secondary | ICD-10-CM

## 2016-03-06 DIAGNOSIS — C189 Malignant neoplasm of colon, unspecified: Secondary | ICD-10-CM

## 2016-03-06 DIAGNOSIS — C786 Secondary malignant neoplasm of retroperitoneum and peritoneum: Secondary | ICD-10-CM

## 2016-03-06 DIAGNOSIS — D473 Essential (hemorrhagic) thrombocythemia: Secondary | ICD-10-CM

## 2016-03-06 DIAGNOSIS — M549 Dorsalgia, unspecified: Secondary | ICD-10-CM

## 2016-03-06 DIAGNOSIS — C787 Secondary malignant neoplasm of liver and intrahepatic bile duct: Secondary | ICD-10-CM

## 2016-03-06 DIAGNOSIS — R1010 Upper abdominal pain, unspecified: Secondary | ICD-10-CM

## 2016-03-06 DIAGNOSIS — N135 Crossing vessel and stricture of ureter without hydronephrosis: Secondary | ICD-10-CM

## 2016-03-06 LAB — URINALYSIS, ROUTINE W REFLEX MICROSCOPIC
BILIRUBIN URINE: NEGATIVE
GLUCOSE, UA: 100 mg/dL — AB
Ketones, ur: NEGATIVE mg/dL
Leukocytes, UA: NEGATIVE
Nitrite: NEGATIVE
PH: 5.5 (ref 5.0–8.0)
Protein, ur: 30 mg/dL — AB
SPECIFIC GRAVITY, URINE: 1.019 (ref 1.005–1.030)

## 2016-03-06 LAB — BASIC METABOLIC PANEL
Anion gap: 5 (ref 5–15)
BUN: 8 mg/dL (ref 6–20)
CHLORIDE: 108 mmol/L (ref 101–111)
CO2: 21 mmol/L — ABNORMAL LOW (ref 22–32)
CREATININE: 0.43 mg/dL — AB (ref 0.44–1.00)
Calcium: 7.8 mg/dL — ABNORMAL LOW (ref 8.9–10.3)
GFR calc non Af Amer: >60 mL/min (ref >60)
GLUCOSE: 117 mg/dL — AB (ref 65–99)
Potassium: 3.9 mmol/L (ref 3.5–5.1)
SODIUM: 134 mmol/L — AB (ref 135–145)

## 2016-03-06 LAB — URINE MICROSCOPIC-ADD ON: WBC, UA: NONE SEEN WBC/hpf (ref 0–5)

## 2016-03-06 LAB — CBC
HCT: 29.3 % — ABNORMAL LOW (ref 36.0–46.0)
Hemoglobin: 9 g/dL — ABNORMAL LOW (ref 12.0–15.0)
MCH: 21.5 pg — AB (ref 26.0–34.0)
MCHC: 30.7 g/dL (ref 30.0–36.0)
MCV: 70.1 fL — ABNORMAL LOW (ref 78.0–100.0)
Platelets: 375 10*3/uL (ref 150–400)
RBC: 4.18 MIL/uL (ref 3.87–5.11)
RDW: 16.1 % — AB (ref 11.5–15.5)
WBC: 11.7 10*3/uL — ABNORMAL HIGH (ref 4.0–10.5)

## 2016-03-06 LAB — LACTIC ACID, PLASMA: LACTIC ACID, VENOUS: 1.8 mmol/L (ref 0.5–2.0)

## 2016-03-06 MED ORDER — TAMSULOSIN HCL 0.4 MG PO CAPS
0.4000 mg | ORAL_CAPSULE | Freq: Every day | ORAL | Status: DC
  Administered 2016-03-06 – 2016-03-15 (×10): 0.4 mg via ORAL
  Filled 2016-03-06 (×10): qty 1

## 2016-03-06 NOTE — Progress Notes (Signed)
Assessment: 1. Metastatic colon cancer. Awaiting Oncology evaluation and Rx. Discussion with Pt and husband this AM. She will probably need elective Left ureteral stent in future. This could be placed as [facility] at [facility].                         2. Voiding: Foley out. Pt has not voided. 700cc this am. May be too early to be concerned.   Plan:  Walk pt , follow pvr's. Consider I/O cath, or repeat foley until pt is up and around.     Subjective:     AEMILIA Barton is a 60 y.o. female with:  1. abdominal pain with N/V and constipation x 1 week.   CT scan revealed a colonic mass at the distal sigmoid colon resulting in partial obstruction compatible with primary colonic malignancy, small adjacent nodules concerning for local spread of disease, non specific hypodensities in the liver, and no hydroureteronephrosis. WBC 11, Cr 0.56 on admission.   .   She is now s/p colon resection with colostomy 03/03/16 by Dr. Brantley Stage. During the procedure he noted that the cancer was densely adherent to the left pelvic sidewall where the ureter entered. The two structures could not be easily separated. He documented in his op note that he felt she would likely require a left ureteral stent and chemotherapy due to multiple metastatic sites.     Path:   1. Peritoneum, biopsy, Peritoneal Nodule - METASTATIC ADENOCARCINOMA, CONSISTENT WITH COLONIC PRIMARY. 2. Peritoneum, biopsy, 2nd Peritoneal Nodule - METASTATIC ADENOCARCINOMA, CONSISTENT WITH COLONIC PRIMARY. [name] MD  2.  She did not have a foley placed until post operatively at which time she had 1700cc out. Foley out now, but pt has not voided. 700cc by u/s this AM, and pt has no sense of urge to void.  U/S: O: Abd: tender 2ndary to surgical incision. No bladder palpation A: Pt will get up and walk and try to transfer to toilet to void. ? 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  P: Follow.  3. PMH:  1.  lumbar radiculopathy                2.  Scoliosis,                3. Depression                4. Thrombocytosis                5.  tachycardia Objective: Vital signs in last 24 hours: Temp:  [97.6 F (36.4 C)-99 F (37.2 C)] 98.4 F (36.9 C) (06/08 0341) Pulse Rate:  [118] 118 (06/08 0343) Resp:  [16-36] 23 (06/08 0753) BP: (107-151)/(67-137) 126/69 mmHg (06/08 0343) SpO2:  [98 %-100 %] 99 % (06/08 0753) Weight:  [72.848 kg (160 lb 9.6 oz)] 72.848 kg (160 lb 9.6 oz) (06/08 0446)A  Intake/Output from previous day: 06/07 0701 - 06/08 0700 In: 1862.8 [I.V.:1573.8; IV Piggyback:289] Out: 1250 [Urine:1100; Emesis/NG output:150] Intake/Output this shift:    Past Medical History  Diagnosis Date  . PONV (postoperative nausea and vomiting)   . Headache(784.0)     not since surgery for spinal leak  . Blood dyscrasia     thrombocytosis not on any meds  . Chronic back pain   . Anemia     History  . Essential thrombocytosis (HCC)     pt. denies this diagnosis  . Tachycardia     Physical  Exam:  Lungs - Normal respiratory effort, chest expands symmetrically.  Abdomen - Soft, non-tender & non-distended.  Lab Results:  Recent Labs  03/03/16 2120 03/04/16 0959 03/06/16 0424  WBC 2.8* 11.7* 11.7*  HGB 12.1 13.0 9.0*  HCT 39.9 41.0 29.3*   BMET  Recent Labs  03/04/16 0959 03/06/16 0424  NA 135 134*  K 4.6 3.9  CL 107 108  CO2 18* 21*  GLUCOSE 97 117*  BUN 12 8  CREATININE 0.66 0.43*  CALCIUM 7.6* 7.8*   No results for input(s): LABURIN in the last 72 hours. Results for orders placed or performed during the hospital encounter of 02/29/16  Surgical PCR screen     Status: None   Collection Time: 03/03/16  4:00 AM  Result Value Ref Range Status   MRSA, PCR NEGATIVE NEGATIVE Final   Staphylococcus aureus NEGATIVE NEGATIVE Final    Comment:        The Xpert SA Assay (FDA approved for NASAL specimens in patients over 30 years of age), is one component of a comprehensive surveillance program.  Test  performance has been validated by [facility] for patients greater than or equal to 52 year old. It is not intended to diagnose infection nor to guide or monitor treatment.     Studies/Results: Path as above.   [name] 03/06/2016, 8:09 AM

## 2016-03-06 NOTE — Consult Note (Signed)
New Hematology/Oncology Consult   Referral MD: Dr. Doyle Askew       Reason for Referral: Colon cancer    HPI: She reports feeling well until approximately one week prior to hospital admission which she developed the onset of nausea and constipation. She presented to the emergency room 02/29/2016 with nausea/vomiting and abdominal pain. A CT of the abdomen and pelvis on 02/29/2016 revealed nonspecific hypodensities in the liver, unremarkable kidneys. The colon was diffusely filled with fluid and air with evidence of a partial obstruction due to a mass at the distal sigmoid colon. Small adjacent nodules were seen. Small volume of ascites. An NG tube was placed and surgery was consulted. She was taken to the operating room on 03/03/2016. The large and small bowel were dilated consistent with an obstruction. Studding of the pelvic peritoneum was noted adjacent to a large obstructing mass at the proximal rectum. The mass was adherent to the left pelvic sidewall. Chronic serosal tears were noted of the cecum. Frozen sections of 2 peritoneal nodules were consistent with adenocarcinoma. Multiple 1 cm white lesions were noted in the liver consistent with metastatic disease. The left ureter appeared mildly dilated where it entered the pelvic sidewall at the adherent cancer. The ureter and tumor could not be easily separated. A decision was made to proceed with diversion and not attempt to resect the primary tumor. An end colostomy was created at the descending colon.  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  [name] remains in the ICU step down unit recovering from surgery.     Past Medical History  Diagnosis Date  . G2 P2    . Headache(784.0)     not since surgery for spinal leak  .        Marland Kitchen Chronic back pain   . Anemia     History  . Essential thrombocytosis (HCC)           :  Past Surgical History   Procedure Laterality Date  . Cyst excision  11/11/2012    lumbar cyst remove  . Maximum access (mas)posterior lumbar interbody fusion (plif) 1 level N/A 01/11/2014    Procedure: FOR MAXIMUM ACCESS (MAS) POSTERIOR LUMBAR INTERBODY FUSION Lumbar Five Sacral One;  Surgeon: [name], MD;  Location: [facility];  Service: Neurosurgery;  Laterality: N/A;  FOR MAXIMUM ACCESS (MAS) POSTERIOR LUMBAR INTERBODY FUSION Lumbar Five Sacral One  . Back surgery      x 4 lower back  . Wisdom tooth extraction    . Perm. dental implant      lower back right  . Laparotomy N/A 03/03/2016    Procedure: EXPLORATORY LAPAROTOMY;  Surgeon: Erroll Luna, MD;  Location: [facility];  Service: General;  Laterality: N/A;  . Biopsy N/A 03/03/2016    Procedure: BIOPSY OF PERITONEAL NODULE;  Surgeon: Erroll Luna, MD;  Location: [place];  Service: General;  Laterality: N/A;  . Colostomy N/A 03/03/2016    Procedure: DIVERTING SIGMOID COLOSTOMY;  Surgeon: Erroll Luna, MD;  Location: [place];  Service: General;  Laterality: N/A;  :   Current facility-administered medications:  .  acetaminophen (TYLENOL) tablet 650 mg, 650 mg, Oral, Q6H PRN **OR** acetaminophen (TYLENOL) suppository 650 mg, 650 mg, Rectal, Q6H PRN, Ivor Costa, MD .  antiseptic oral rinse (CPC / CETYLPYRIDINIUM CHLORIDE 0.05%) solution 7 mL, 7 mL, Mouth Rinse, q12n4p, Jessica U Vann, DO, 7 mL at 03/05/16 1600 .  chlorhexidine (PERIDEX) 0.12 %  solution 15 mL, 15 mL, Mouth Rinse, BID, Jessica U Vann, DO, 15 mL at 03/05/16 2200 .  dextrose 5 %-0.9 % sodium chloride infusion, , Intravenous, Continuous, Emina Riebock, NP, Last Rate: 125 mL/hr at 03/06/16 0629 .  diphenhydrAMINE (BENADRYL) injection 12.5 mg, 12.5 mg, Intravenous, Q6H PRN **OR** diphenhydrAMINE (BENADRYL) 12.5 MG/5ML elixir 12.5 mg, 12.5 mg, Oral, Q6H PRN, Erroll Luna, MD .  enoxaparin (LOVENOX) injection 40 mg, 40 mg, Subcutaneous, Daily, Emina Riebock, NP, 40 mg at 03/05/16 0916 .  HYDROmorphone  (DILAUDID) 1 mg/mL PCA injection, , Intravenous, Q4H, Erroll Luna, MD, 1.2 mg at 03/05/16 1228 .  HYDROmorphone (DILAUDID) injection 1 mg, 1 mg, Intravenous, Q3H PRN, Ivor Costa, MD, 0.5 mg at 03/03/16 1117 .  HYDROmorphone (DILAUDID) injection 2 mg, 2 mg, Intravenous, Q1H PRN, Erroll Luna, MD, 2 mg at 03/03/16 1659 .  ketorolac (TORADOL) 30 MG/ML injection 30 mg, 30 mg, Intravenous, Q6H PRN, Erroll Luna, MD, 30 mg at 03/05/16 [id number] .  LORazepam (ATIVAN) injection 1 mg, 1 mg, Intravenous, Q2H PRN, Geradine Girt, DO, 1 mg at 03/03/16 2254 .  methocarbamol (ROBAXIN) 500 mg in dextrose 5 % 50 mL IVPB, 500 mg, Intravenous, Q6H PRN, Erroll Luna, MD, 500 mg at 03/05/16 0919 .  metoprolol (LOPRESSOR) injection 2.5 mg, 2.5 mg, Intravenous, Q6H PRN, Theodis Blaze, MD .  naloxone [facility]) injection 0.4 mg, 0.4 mg, Intravenous, PRN **AND** sodium chloride flush (NS) 0.9 % injection 9 mL, 9 mL, Intravenous, PRN, Erroll Luna, MD .  ondansetron (ZOFRAN) injection 4 mg, 4 mg, Intravenous, Q6H PRN, Erroll Luna, MD .  sodium chloride flush (NS) 0.9 % injection 3 mL, 3 mL, Intravenous, Q12H, Ivor Costa, MD, 3 mL at 03/05/16 2200:  . antiseptic oral rinse  7 mL Mouth Rinse q12n4p  . chlorhexidine  15 mL Mouth Rinse BID  . enoxaparin (LOVENOX) injection  40 mg Subcutaneous Daily  . HYDROmorphone   Intravenous Q4H  . sodium chloride flush  3 mL Intravenous Q12H  :  No Known Allergies:  Family History  Problem Relation Age of Onset  . Colon cancer Neg Hx   . Colon polyps Neg Hx   . Rectal cancer Neg Hx   . Stomach cancer Neg Hx   . Esophageal cancer Neg Hx   . Heart disease Mother   . Melanoma Mother   :  Social History   Social History  . Marital Status: Married    Spouse Name: N/A  . Number of Children: N/A  . Years of Education: N/A         Social History Main Topics  . Smoking status: Former Smoker -- 0.25 packs/day for 1 years    Types: Cigarettes  . Smokeless tobacco:  Never Used  . Alcohol Use: No  . Drug Use: No  . Sexual Activity: Yes   Review of Systems:  Positives include:Nausea, constipation, vomiting, abdominal pain  A complete ROS was otherwise negative.   Physical Exam:  Blood pressure 126/69, pulse 118, temperature 98.4 F (36.9 C), temperature source Oral, resp. rate 23, height 5\' 5"  (1.651 m), weight 160 lb 9.6 oz (72.848 kg), SpO2 99 %.  HEENT: Neck without mass Lungs: Clear bilaterally, no respiratory distress Cardiac: Tachycardia, regular rate and rhythm Abdomen: Open midline incision with a gauze packing, left abdominal colostomy with liquid stool, no mass, no hepatomegaly  Vascular: No leg edema Lymph nodes: No cervical, supra-clavicular, axillary, or inguinal nodes Neurologic: Alert and oriented, the motor exam appears  intact in the upper and lower extremities Skin: No rash  LABS:   Recent Labs  03/04/16 0959 03/06/16 0424  WBC 11.7* 11.7*  HGB 13.0 9.0*  HCT 41.0 29.3*  PLT 461* 375     Recent Labs  03/04/16 0959 03/06/16 0424  NA 135 134*  K 4.6 3.9  CL 107 108  CO2 18* 21*  GLUCOSE 97 117*  BUN 12 8  CREATININE 0.66 0.43*  CALCIUM 7.6* 7.8*   02/29/2016-[facility]      RADIOLOGY:  Dg Chest 2 View  03/06/2016  CLINICAL DATA:  Shortness of breath and weakness EXAM: CHEST  2 VIEW COMPARISON:  12/17/2012 FINDINGS: The heart size appears normal. Aortic atherosclerosis noted. Lung volumes are low. There are bilateral pleural effusions identified. Overlying platelike atelectasis identified. IMPRESSION: 1. Low lung volumes and bilateral pleural effusions. Electronically Signed   By: Kerby Moors M.D.   On: 03/06/2016 08:28   Ct Abdomen Pelvis W Contrast  02/29/2016  CLINICAL DATA:  Acute onset of constipation, nausea and vomiting. Initial encounter. EXAM: CT ABDOMEN AND PELVIS WITH CONTRAST TECHNIQUE: Multidetector CT imaging of the abdomen and pelvis was performed using the standard protocol following bolus  administration of intravenous contrast. CONTRAST:  172mL ISOVUE-300 IOPAMIDOL (ISOVUE-300) INJECTION 61% COMPARISON:  CT of the lumbar spine performed 01/25/2014, and abdominal radiograph performed earlier today at 2:46 p.m. FINDINGS: The visualized lung bases are clear. A moderate hiatal hernia is noted. The patient's enteric tube is seen coiled within the hiatal hernia, ending about the superior aspect of the hernia. Small volume ascites is noted within the abdomen and pelvis. Nonspecific hypodensities are seen within the liver, measuring up to 2.2 cm in size. Metastatic disease cannot be excluded. The spleen is unremarkable in appearance. The gallbladder is within normal limits. The pancreas and adrenal glands are unremarkable. The kidneys are unremarkable in appearance. There is no evidence of hydronephrosis. No renal or ureteral stones are seen. No perinephric stranding is appreciated. No free fluid is identified. The small bowel is unremarkable in appearance. The stomach is within normal limits. No acute vascular abnormalities are seen. The appendix is not definitely characterized. The colon is diffusely filled with fluid and air. There is interposition of the hepatic flexure of the colon anterior to the liver. This appears to reflect partial obstruction due to a mass at the distal sigmoid colon, measuring approximately 6.6 x 3.2 x 3.8 cm. Small adjacent nodules are seen, concerning for local spread of disease. No pelvic sidewall lymphadenopathy is seen. The bladder is mildly distended and grossly unremarkable in appearance. A 1.7 cm nodule posterior to the uterus may simply reflect an exophytic fibroid. The uterus is otherwise unremarkable. The ovaries are relatively symmetric. No suspicious adnexal masses are seen. No inguinal lymphadenopathy is seen. No acute osseous abnormalities are identified. The patient is status post lumbar spinal fusion at L5-S1. IMPRESSION: 1. Colonic mass noted at the distal  sigmoid colon, measuring 6.6 x 3.2 x 3.8 cm, resulting in partial obstruction. The colon is diffusely filled with fluid and air, more proximally. Findings compatible with primary colonic malignancy. 2. Small adjacent nodules seen, concerning for local spread of disease. No pelvic sidewall lymphadenopathy seen. 3. Small volume ascites within the abdomen and pelvis. 4. Nonspecific hypodensities within the liver, measuring up to 2.2 cm in size. Metastatic disease cannot be excluded. Would correlate with LFTs, and consider further evaluation as deemed clinically appropriate. 5. Moderate hiatal hernia seen. 6. Enteric tube noted coiled within the  hiatal hernia, ending about the superior aspect of the hernia. 7. 1.7 cm nodule posterior to the uterus may simply reflect an exophytic fibroid. Electronically Signed   By: Garald Balding M.D.   On: 02/29/2016 22:18   Dg Abd 2 Views  03/02/2016  ADDENDUM REPORT: 03/02/2016 12:40 ADDENDUM: NG tube is buckled at the side port and looped back on itself in the distal esophagus. Electronically Signed   By: Rolm Baptise M.D.   On: 03/02/2016 12:40  03/02/2016  CLINICAL DATA:  Bowel obstruction EXAM: ABDOMEN - 2 VIEW COMPARISON:  02/29/2016 FINDINGS: Again noted are dilated large and small bowel loops as seen on prior study, unchanged, concerning for distal obstruction. No free air organomegaly. IMPRESSION: Stable distal obstruction pattern. Electronically Signed: By: Rolm Baptise M.D. On: 03/02/2016 11:44   Dg Abd 2 Views  02/29/2016  CLINICAL DATA:  Constipation and vomit EXAM: ABDOMEN - 2 VIEW COMPARISON:  None. FINDINGS: Supine and upright images obtained. There is generalized bowel dilatation with multiple air-fluid levels. No free air evident. Postoperative changes noted at L5 and S1. There is atelectatic change in lung bases. IMPRESSION: Bowel gas pattern is consistent with obstruction. No free air evident. Atelectasis lung bases, slightly more on the right than on the left.  Electronically Signed   By: Lowella Grip III M.D.   On: 02/29/2016 15:15   CT abdomen/pelvis 02/29/2016-images reviewed   Assessment and Plan:   1.  Metastatic colorectal cancer-status post an exploratory laparotomy 03/03/2016 revealing a proximal rectal mass, peritoneal carcinomatosis, and liver metastases  Biopsy of peritoneal nodules 03/03/2016 confirmed metastatic adenocarcinoma consistent with a colon primary  2.  Bowel obstruction secondary to #1  3.  Chronic back pain  4.  Essential thrombocytosis  5.  Early obstruction of the left ureter noted at the time of surgery 03/03/2016-Dr. Gaynelle Arabian consulted   Mrs. Yang was admitted with a distal colon obstruction. She has been diagnosed with metastatic colorectal cancer. She underwent a diverting colostomy and biopsy of peritoneal nodules on 03/03/2016.  I discussed the diagnosis and treatment options with Ms. Merkt and her husband. I recommend systemic therapy to begin when she has recovered from surgery. We submitted the peritoneal nodule pathology for Foundation 1 testing. We will decide on an appropriate chemotherapy regimen based on her recovery from surgery and mutation testing.  Recommendations: 1. Continue postoperative care per Dr. Brantley Stage and internal medicine 2. Schedule Port-A-Cath placement 3. Outpatient follow-up with Dr. Gaynelle Arabian to consider placement of a ureter stent 4. Outpatient follow-up will be scheduled at the [facility] in approximately 2 weeks.              [name] Coder, MD 03/06/2016, 8:31 AM

## 2016-03-06 NOTE — Progress Notes (Signed)
Physical Therapy Treatment Patient Details Name: Carolyn Barton MRN: [id number] DOB: 1956-03-10 Today's Date: 03/06/2016    History of Present Illness Carolyn Barton is a 60 y.o. female with PMH significant for lumbar radiculopathy, scoliosis, depression, thrombocytosis, and tachycardia who presented to the ED with c/o abdominal pain with N/V and constipation x 1 week. CT scan revealed a colonic mass at the distal sigmoid colon resulting in partial obstruction compatible with primary colonic malignancy, small adjacent nodules concerning for local spread of disease, non specific hypodensities in the liver, and no hydroureteronephrosis. WBC 11, Cr 0.56 on admission. She is s/p colon resection with colostomy 03/03/16 by Dr. Brantley Stage. During the procedure he noted that the cancer was densely adherent to the left pelvic sidewall where the ureter entered. The two structures could not be easily separated. He documented in his op note that he felt she would likely require a left ureteral stent and chemotherapy due to multiple metastatic sites. Final path is pending but it appears to be adenocarcinoma. Oncology is evaluating the pt.     PT Comments    Patient seen for mobility progression. Tolerated bed mobility and ambulation but required significant time to perform secondary to back pain. Patient with elevated HR upper 140s with activity, required increased rest breaks. Remained elevated 120s with rest, nsg aware. Saturations 98% on room air. No DOE noted.   Follow Up Recommendations  Home health PT;Supervision/Assistance - 24 hour     Equipment Recommendations  Rolling walker with 5" wheels;3in1 (PT)    Recommendations for Other Services       Precautions / Restrictions Precautions Precautions: Fall Restrictions Weight Bearing Restrictions: No    Mobility  Bed Mobility Overal bed mobility: Needs Assistance Bed Mobility: Supine to Sit;Sit to Supine     Supine to sit: Mod  assist Sit to supine: Mod assist   General bed mobility comments: Moderate assist to rotate and elevate trunk to upright, assist for positioning and elevation of LEs to return to supine   Transfers Overall transfer level: Needs assistance Equipment used: Rolling walker (2 wheeled)   Sit to Stand: Mod assist;+2 physical assistance         General transfer comment: Moderate assist to elevate to standing from raised surface. increased time to perform  Ambulation/Gait Ambulation/Gait assistance: Min assist Ambulation Distance (Feet): 60 Feet Assistive device: Rolling walker (2 wheeled) Gait Pattern/deviations: Step-through pattern;Decreased stride length;Antalgic;Trunk flexed Gait velocity: signifcantly decreased Gait velocity interpretation: <1.8 ft/sec, indicative of risk for recurrent falls General Gait Details: Significantly limited by back pain with mobility. Slow guarded gait, increased time to perform   Stairs            Wheelchair Mobility    Modified Rankin (Stroke Patients Only)       Balance     Sitting balance-Leahy Scale: Fair Sitting balance - Comments: could sit EOB self supported without assist     Standing balance-Leahy Scale: Poor Standing balance comment: heavy reliance on RW for UE support                    Cognition Arousal/Alertness: Awake/alert Behavior During Therapy: Anxious Overall Cognitive Status: Within Functional Limits for tasks assessed                      Exercises      General Comments General comments (skin integrity, edema, etc.): increased time spend with bedmobility and gait secondary to pain and elevated HR  Pertinent Vitals/Pain Pain Assessment: 0-10 Pain Score: 4  Pain Location: back Pain Descriptors / Indicators: Aching;Grimacing;Guarding Pain Intervention(s): Limited activity within patient's tolerance;Monitored during session;Premedicated before session;Repositioned    Home Living                       Prior Function            PT Goals (current goals can now be found in the care plan section) Acute Rehab PT Goals Patient Stated Goal: to go home PT Goal Formulation: With patient Time For Goal Achievement: 03/19/16 Potential to Achieve Goals: Good Progress towards PT goals: Progressing toward goals    Frequency  Min 3X/week    PT Plan Current plan remains appropriate    Co-evaluation             End of Session Equipment Utilized During Treatment: Gait belt Activity Tolerance: Patient limited by fatigue Patient left: in bed;with call bell/phone within reach     Time: 1430-1454 PT Time Calculation (min) (ACUTE ONLY): 24 min  Charges:  $Gait Training: 8-22 mins $Therapeutic Activity: 8-22 mins                    G CodesDuncan Dull 04/02/2016, 5:34 PM Alben Deeds, [place] DPT  [phone]

## 2016-03-06 NOTE — Progress Notes (Signed)
3 Days Post-Op  Subjective: She looks a little better this Am, Tachycardia is a little better with the foley back in.  She could not void.  Open wound looks a bit dry and there was some green staining on the dressing.  I don't see a source and wonder if it isn't somehow from the ostomy.  Ostomy bag has some green colored stool in it, she has had the NG removed and Clears ordered.    Objective: Vital signs in last 24 hours: Temp:  [97.6 F (36.4 C)-99.4 F (37.4 C)] 99.4 F (37.4 C) (06/08 0851) Pulse Rate:  [118] 118 (06/08 0343) Resp:  [16-36] 28 (06/08 1107) BP: (107-151)/(67-137) 126/69 mmHg (06/08 0343) SpO2:  [98 %-100 %] 100 % (06/08 1107) Weight:  [72.848 kg (160 lb 9.6 oz)] 72.848 kg (160 lb 9.6 oz) (06/08 0446) Last BM Date: 03/03/16 NG 150 Stool - none recorded Urine 1100 Fluid balance 612 Afebrile, VSS Labs OK, WBC still up, H/H is down some CXR shows low lung volumes, and pleural effusions bilaterally.    Intake/Output from previous day: 06/07 0701 - 06/08 0700 In: 1862.8 [I.V.:1573.8; IV Piggyback:289] Out: 1250 [Urine:1100; Emesis/NG output:150] Intake/Output this shift: Total I/O In: -  Out: 475 [Urine:375; Stool:100]  General appearance: alert, cooperative, no distress and fatigued this AM.   Resp: clear to auscultation bilaterally and anterior exam, she is getting abd wound changed.   GI: soft, open site looks ok, rather dry, but i do not see a source for the soiling noted on the dressing.  she is sore but not tender and does not have peritonitis currently.    Lab Results:   Recent Labs  03/04/16 0959 03/06/16 0424  WBC 11.7* 11.7*  HGB 13.0 9.0*  HCT 41.0 29.3*  PLT 461* 375    BMET  Recent Labs  03/04/16 0959 03/06/16 0424  NA 135 134*  K 4.6 3.9  CL 107 108  CO2 18* 21*  GLUCOSE 97 117*  BUN 12 8  CREATININE 0.66 0.43*  CALCIUM 7.6* 7.8*   PT/INR No results for input(s): LABPROT, INR in the last 72 hours.   Recent Labs Lab  02/29/16 1457 03/02/16 0449 03/03/16 0634  AST 28 17 15   ALT 23 16 15   ALKPHOS 88 69 65  BILITOT 0.4 0.7 0.7  PROT 6.9 5.5* 5.5*  ALBUMIN 3.7 2.8* 2.9*     Lipase     Component Value Date/Time   LIPASE 17 02/29/2016 1457     Studies/Results: Dg Chest 2 View  03/06/2016  CLINICAL DATA:  Shortness of breath and weakness EXAM: CHEST  2 VIEW COMPARISON:  12/17/2012 FINDINGS: The heart size appears normal. Aortic atherosclerosis noted. Lung volumes are low. There are bilateral pleural effusions identified. Overlying platelike atelectasis identified. IMPRESSION: 1. Low lung volumes and bilateral pleural effusions. Electronically Signed   By: Kerby Moors M.D.   On: 03/06/2016 08:28    Medications: . antiseptic oral rinse  7 mL Mouth Rinse q12n4p  . chlorhexidine  15 mL Mouth Rinse BID  . enoxaparin (LOVENOX) injection  40 mg Subcutaneous Daily  . HYDROmorphone   Intravenous Q4H  . sodium chloride flush  3 mL Intravenous Q12H   . dextrose 5 % and 0.9% NaCl 125 mL/hr at 03/06/16 [id number]    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 Chronic constipation Hx of depression Hx of essential thrombocytosis  Assessment/Plan Obstructing large bowel mass, likely adenocarcinoma with metastasis to liver  POD#2 exploratory laparotomy, descending colostomy,  mucus fistula, repair of chronic serosal tears---Dr. Brantley Stage 03/03/16 Hypotension/tachycardia - fluid bolus this AM Urinary retention, she has foley back in .  - will add Flomax  FEN-  IV fluids, and starting clears, NG out ID:  Pre op only VTE prophylaxis-scd/lovenox   Plan:  Increase Wet to dry dressings to TID, Dr. Doyle Askew is giving her some BB for her rate also.  If she does well with the PO's perhaps we can transition her back to PO Methadone, and give oral supplements tomorrow.    LOS: 5 days    Numan Zylstra 03/06/2016 [phone]

## 2016-03-06 NOTE — Progress Notes (Signed)
PROGRESS NOTE    Carolyn Barton  [id number] DOB: [birth year] DOA: 02/29/2016   PCP: Marton Redwood, MD   Brief Narrative:  60 y.o. female with chronic back pain, thrombocytosis, depression, tachycardia, who presented with nausea, vomiting, abdominal pain and constipation. Imaging studies notable for colonic mass in distal sigmoid colon, measuring 6.6 x 3.2 x 3.8 cm, resulting in partial obstruction, compatible with primary colonic malignancy, small adjacent nodules, concerning for local spread of disease. Pt is now S/p resection on 6/5.  Assessment & Plan:   Colonic mass - Obstructing large bowel mass, likely adenocarcinoma with metastasis to liver  - POD# exploratory laparotomy, descending colostomy, mucus fistula by Dr. Brantley Stage 03/03/16 - NGT out and pt reports feeling better  - attempt clear liquids today and see how pt does  - provide analgesia as needed   Hypotension/tachycardia - appears to be reactive - added low dose metoprolol as needed for HR > 110 - keep in SDU for now   Pleural effusions - noted on CXR but no dyspnea - cut fluids down to prevent decompensation   Leukocytosis - from leukopenia to leukocytosis in 24 hours: 2.8 --> 11.7 - lactic acid WNL and UA clear, CXR with no indication of PNA - CBC in AM  Right hand swelling - from IV line - improved   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 - continue to provide IV analgesia until pt PO and at that time we can resume Methadone   Thrombocytosis, essential  - resolved  - CBC in AM  Hypokalemia  - resolved  - BMP in AM  N/V - improving - allow zofran as needed   Urinary retention - placed foley - pt reports feeling better   DVT prophylaxis:  SCD's  Code Status: Full Code  Family Communication: Husband at bedside  Disposition Plan:  Home once cleared by surgery team   Consultants:   Surgery   Oncology   Procedures:   Colonic resection  Subjective: Fast HR last PM and this  AM  Objective: Filed Vitals:   03/06/16 1119 03/06/16 1555 03/06/16 1557 03/06/16 1709  BP:   122/77   Pulse:   115   Temp: 98.5 F (36.9 C)  98.6 F (37 C)   TempSrc: Oral  Oral   Resp:  22 26 28   Height:      Weight:      SpO2:  100% 100% 98%    Intake/Output Summary (Last 24 hours) at 03/06/16 1744 Last data filed at 03/06/16 1600  Gross per 24 hour  Intake 3023.2 ml  Output   1025 ml  Net 1998.2 ml   Filed Weights   02/29/16 1445 03/01/16 0005 03/06/16 0446  Weight: 61.236 kg (135 lb) 57.635 kg (127 lb 1 oz) 72.848 kg (160 lb 9.6 oz)    Examination:  General exam: more awake and interactive today Respiratory system: Clear to auscultation. Respiratory effort normal, diminished breath sounds at bases  Cardiovascular system: S1 & S2 heard, tachy-sinus Gastrointestinal system: non tender, open site looks ok, bit dry, no tenderness with dressing changes  Central nervous system: Alert and oriented. No focal neurological deficits.  Data Reviewed: I have personally reviewed following labs and imaging studies  CBC:  Recent Labs Lab 03/02/16 0449 03/03/16 0634 03/03/16 2120 03/04/16 0959 03/06/16 0424  WBC 11.2* 10.4 2.8* 11.7* 11.7*  HGB 9.9* 9.9* 12.1 13.0 9.0*  HCT 32.9* 32.6* 39.9 41.0 29.3*  MCV 73.8* 71.8* 71.1* 72.1* 70.1*  PLT 628* 480*  552* 461* 123456   Basic Metabolic Panel:  Recent Labs Lab 03/02/16 0449 03/03/16 0634 03/03/16 2306 03/04/16 0959 03/06/16 0424  NA 138 135 133* 135 134*  K 3.5 3.2* 4.6 4.6 3.9  CL 106 102 104 107 108  CO2 18* 24 19* 18* 21*  GLUCOSE 49* 81 125* 97 117*  BUN 7 <5* 8 12 8   CREATININE 0.56 0.47 0.77 0.66 0.43*  CALCIUM 8.7* 8.5* 8.0* 7.6* 7.8*  MG  --   --  1.2* 1.8  --    Liver Function Tests:  Recent Labs Lab 02/29/16 1457 03/02/16 0449 03/03/16 0634  AST 28 17 15   ALT 23 16 15   ALKPHOS 88 69 65  BILITOT 0.4 0.7 0.7  PROT 6.9 5.5* 5.5*  ALBUMIN 3.7 2.8* 2.9*    Recent Labs Lab 02/29/16 1457   LIPASE 17   Coagulation Profile:  Recent Labs Lab 02/29/16 2343  INR 1.29   CBG:  Recent Labs Lab 03/01/16 0741 03/02/16 0818 03/02/16 1152 03/03/16 0748  GLUCAP 74 49* 127* 79   Anemia Panel:  Recent Labs  03/04/16 0412  FERRITIN 23  TIBC 232*  IRON 6*   Urine analysis:    Component Value Date/Time   COLORURINE AMBER* 03/06/2016 0157   APPEARANCEUR CLEAR 03/06/2016 0157   LABSPEC 1.019 03/06/2016 0157   PHURINE 5.5 03/06/2016 0157   GLUCOSEU 100* 03/06/2016 0157   HGBUR SMALL* 03/06/2016 0157   BILIRUBINUR NEGATIVE 03/06/2016 0157   KETONESUR NEGATIVE 03/06/2016 0157   PROTEINUR 30* 03/06/2016 0157   UROBILINOGEN 1.0 12/18/2012 1754   NITRITE NEGATIVE 03/06/2016 0157   LEUKOCYTESUR NEGATIVE 03/06/2016 0157   Recent Results (from the past 240 hour(s))  Surgical PCR screen     Status: None   Collection Time: 03/03/16  4:00 AM  Result Value Ref Range Status   MRSA, PCR NEGATIVE NEGATIVE Final   Staphylococcus aureus NEGATIVE NEGATIVE Final    Anti-infectives    Start     Dose/Rate Route Frequency Ordered Stop   03/03/16 0846  cefoTEtan in Dextrose 5% (CEFOTAN) 2-2.08 GM-% IVPB    Comments:  Forte, Lindsi   : cabinet override      03/03/16 0846 03/03/16 2059   03/03/16 0600  cefoTEtan (CEFOTAN) 2 g in dextrose 5 % 50 mL IVPB    Comments:  In OR holding pre op   2 g 100 mL/hr over 30 Minutes Intravenous On call to O.R. 03/02/16 1054 03/03/16 1009   03/02/16 1000  cefoTEtan (CEFOTAN) 2 g in dextrose 5 % 50 mL IVPB  Status:  Discontinued    Comments:  In OR holding pre op   2 g 100 mL/hr over 30 Minutes Intravenous Every 12 hours 03/02/16 0959 03/02/16 1054     Radiology Studies: Dg Chest 2 View  03/06/2016  CLINICAL DATA:  Shortness of breath and weakness EXAM: CHEST  2 VIEW COMPARISON:  12/17/2012 FINDINGS: The heart size appears normal. Aortic atherosclerosis noted. Lung volumes are low. There are bilateral pleural effusions identified. Overlying  platelike atelectasis identified. IMPRESSION: 1. Low lung volumes and bilateral pleural effusions. Electronically Signed   By: Kerby Moors M.D.   On: 03/06/2016 08:28    Scheduled Meds: . antiseptic oral rinse  7 mL Mouth Rinse q12n4p  . chlorhexidine  15 mL Mouth Rinse BID  . enoxaparin (LOVENOX) injection  40 mg Subcutaneous Daily  . HYDROmorphone   Intravenous Q4H  . sodium chloride flush  3 mL Intravenous Q12H  .  tamsulosin  0.4 mg Oral Daily   Continuous Infusions: . dextrose 5 % and 0.9% NaCl 125 mL/hr at 03/06/16 0629     LOS: 5 days    Time spent: 35 min  Faye Ramsay, DO [facility] Pager [phone]  If 7PM-7AM, please contact night-coverage [email or web address] Password [facility] 03/06/2016, 5:44 PM

## 2016-03-06 NOTE — Plan of Care (Signed)
Problem: Fluid Volume: Goal: Ability to maintain a balanced intake and output will improve Outcome: Not Progressing Unable to void on own post catheter discontinuation.

## 2016-03-07 DIAGNOSIS — E440 Moderate protein-calorie malnutrition: Secondary | ICD-10-CM | POA: Diagnosis present

## 2016-03-07 LAB — CBC
HEMATOCRIT: 28.7 % — AB (ref 36.0–46.0)
Hemoglobin: 9 g/dL — ABNORMAL LOW (ref 12.0–15.0)
MCH: 21.4 pg — AB (ref 26.0–34.0)
MCHC: 31.4 g/dL (ref 30.0–36.0)
MCV: 68.2 fL — AB (ref 78.0–100.0)
Platelets: 336 10*3/uL (ref 150–400)
RBC: 4.21 MIL/uL (ref 3.87–5.11)
RDW: 16.1 % — AB (ref 11.5–15.5)
WBC: 8.8 10*3/uL (ref 4.0–10.5)

## 2016-03-07 LAB — URINE CULTURE: Culture: NO GROWTH

## 2016-03-07 LAB — BASIC METABOLIC PANEL
Anion gap: 6 (ref 5–15)
BUN: <5 mg/dL — ABNORMAL LOW (ref 6–20)
CALCIUM: 7.8 mg/dL — AB (ref 8.9–10.3)
CO2: 23 mmol/L (ref 22–32)
CREATININE: 0.44 mg/dL (ref 0.44–1.00)
Chloride: 105 mmol/L (ref 101–111)
GFR calc Af Amer: >60 mL/min (ref >60)
GFR calc non Af Amer: >60 mL/min (ref >60)
GLUCOSE: 97 mg/dL (ref 65–99)
Potassium: 2.9 mmol/L — ABNORMAL LOW (ref 3.5–5.1)
Sodium: 134 mmol/L — ABNORMAL LOW (ref 135–145)

## 2016-03-07 LAB — GLUCOSE, CAPILLARY: GLUCOSE-CAPILLARY: 89 mg/dL (ref 65–99)

## 2016-03-07 MED ORDER — HYDROMORPHONE HCL 1 MG/ML IJ SOLN
1.0000 mg | INTRAMUSCULAR | Status: DC | PRN
  Administered 2016-03-07 – 2016-03-15 (×8): 1 mg via INTRAVENOUS
  Filled 2016-03-07 (×8): qty 1

## 2016-03-07 MED ORDER — ENSURE ENLIVE PO LIQD
237.0000 mL | Freq: Three times a day (TID) | ORAL | Status: DC
  Administered 2016-03-07 – 2016-03-11 (×7): 237 mL via ORAL

## 2016-03-07 MED ORDER — POTASSIUM CHLORIDE 10 MEQ/100ML IV SOLN
10.0000 meq | INTRAVENOUS | Status: DC
  Administered 2016-03-07 (×2): 10 meq via INTRAVENOUS
  Filled 2016-03-07 (×2): qty 100

## 2016-03-07 MED ORDER — METHADONE HCL 10 MG PO TABS
10.0000 mg | ORAL_TABLET | Freq: Three times a day (TID) | ORAL | Status: DC
  Administered 2016-03-07 – 2016-03-15 (×25): 10 mg via ORAL
  Filled 2016-03-07 (×26): qty 1

## 2016-03-07 MED ORDER — KETOROLAC TROMETHAMINE 30 MG/ML IJ SOLN
15.0000 mg | Freq: Four times a day (QID) | INTRAMUSCULAR | Status: AC | PRN
  Administered 2016-03-08: 15 mg via INTRAVENOUS
  Filled 2016-03-07: qty 1

## 2016-03-07 MED ORDER — METHADONE HCL 10 MG PO TABS: 10.0000 mg | ORAL_TABLET | Freq: Three times a day (TID) | ORAL | Status: DC

## 2016-03-07 MED ORDER — HYDROMORPHONE HCL 2 MG PO TABS: 4.0000 mg | ORAL_TABLET | ORAL | Status: DC | PRN

## 2016-03-07 MED ORDER — DULOXETINE HCL 60 MG PO CPEP
60.0000 mg | ORAL_CAPSULE | Freq: Every day | ORAL | Status: DC
  Administered 2016-03-07 – 2016-03-15 (×9): 60 mg via ORAL
  Filled 2016-03-07 (×9): qty 1

## 2016-03-07 MED ORDER — DULOXETINE HCL 20 MG PO CPEP: 20.0000 mg | ORAL_CAPSULE | Freq: Every day | ORAL | Status: DC

## 2016-03-07 MED ORDER — ENSURE ENLIVE PO LIQD
237.0000 mL | Freq: Two times a day (BID) | ORAL | Status: DC
  Administered 2016-03-07: 237 mL via ORAL

## 2016-03-07 NOTE — Progress Notes (Signed)
PROGRESS NOTE    Carolyn Barton  [id number] DOB: 12-24-1955 DOA: 02/29/2016   PCP: Marton Redwood, MD   Brief Narrative:  60 y.o. female with chronic back pain, thrombocytosis, depression, tachycardia, who presented with nausea, vomiting, abdominal pain and constipation. Imaging studies notable for colonic mass in distal sigmoid colon, measuring 6.6 x 3.2 x 3.8 cm, resulting in partial obstruction, compatible with primary colonic malignancy, small adjacent nodules, concerning for local spread of disease. Pt is now S/p resection on 6/5.  Assessment & Plan:   Colonic mass - Obstructing large bowel mass, likely adenocarcinoma with metastasis to liver  - POD# 4 exploratory laparotomy, descending colostomy, mucus fistula by Dr. Brantley Stage 03/03/16 - NGT out and pt reports feeling better  - advance diet to full liquids  - provide analgesia as needed  - stop IVF as pt over 30 lbs over baseline weight  - encouraged oral intake   Hypotension/tachycardia - appears to be reactive - added low dose metoprolol as needed for HR > 110 - keep in SDU for now   Pleural effusions, anasarca - noted on CXR but no dyspnea - stop fluids as pt over 30 lbs over baseline weight, upper and lower extremity bilateral edema  - resume home regimen with lasix - monitor daily weights   Leukocytosis - from leukopenia to leukocytosis in 24 hours: 2.8 --> 11.7 - lactic acid WNL and UA clear, CXR with no indication of PNA - WBC now WNL  - CBC in AM  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   Objective: Filed Vitals:   03/06/16 2249 03/07/16 0000 03/07/16 0319 03/07/16 0357  BP: 138/80  138/86   Pulse: 118  120   Temp: 99.2 F (37.3 C)  98.6 F (37 C)   TempSrc: Oral  Oral   Resp: 18 19 27 21   Height:   5\' 5"  (1.651 m)   Weight:   73.573 kg (162 lb 3.2 oz)   SpO2: 93% 90% 96% 96%    Intake/Output Summary (Last 24 hours) at 03/07/16 0657 Last data filed at 03/07/16 0528  Gross per 24 hour  Intake 2063.42 ml  Output   1425 ml  Net 638.42 ml   Filed Weights   03/01/16 0005 03/06/16 0446 03/07/16 0319  Weight: 57.635 kg (127 lb 1 oz) 72.848 kg (160 lb 9.6 oz) 73.573 kg (162 lb 3.2 oz)    Examination:  General exam: more awake and interactive today, diffuse anasarca  Respiratory system: Clear to auscultation. Respiratory effort normal, diminished breath sounds at bases  Cardiovascular system: S1 & S2 heard, tachy-sinus Gastrointestinal system: non tender, open site looks ok, bit dry, no tenderness with dressing changes  Central nervous system: Alert and oriented. No focal neurological deficits.  Data Reviewed: I have personally reviewed following labs and imaging studies  CBC:  Recent Labs Lab 03/03/16 0634 03/03/16 2120 03/04/16 0959 03/06/16 0424 03/07/16 0344  WBC  10.4 2.8* 11.7* 11.7* 8.8  HGB 9.9* 12.1 13.0 9.0* 9.0*  HCT 32.6* 39.9 41.0 29.3* 28.7*  MCV 71.8* 71.1* 72.1* 70.1* 68.2*  PLT 480* 552* 461* 375 [id number]   Basic Metabolic Panel:  Recent Labs Lab 03/03/16 0634 03/03/16 2306 03/04/16 0959 03/06/16 0424 03/07/16 0344  NA 135 133* 135 134* 134*  K 3.2* 4.6 4.6 3.9 2.9*  CL 102 104 107 108 105  CO2 24 19* 18* 21* 23  GLUCOSE 81 125* 97 117* 97  BUN <5* 8 12 8  <5*  CREATININE 0.47 0.77 0.66 0.43* 0.44  CALCIUM 8.5* 8.0* 7.6* 7.8* 7.8*  MG  --  1.2* 1.8  --   --    Liver Function Tests:  Recent Labs Lab 02/29/16 1457 03/02/16 0449  03/03/16 0634  AST 28 17 15   ALT 23 16 15   ALKPHOS 88 69 65  BILITOT 0.4 0.7 0.7  PROT 6.9 5.5* 5.5*  ALBUMIN 3.7 2.8* 2.9*    Recent Labs Lab 02/29/16 1457  LIPASE 17   Coagulation Profile:  Recent Labs Lab 02/29/16 2343  INR 1.29   CBG:  Recent Labs Lab 03/01/16 0741 03/02/16 0818 03/02/16 1152 03/03/16 0748  GLUCAP 74 49* 127* 79   Urine analysis:    Component Value Date/Time   COLORURINE AMBER* 03/06/2016 0157   APPEARANCEUR CLEAR 03/06/2016 0157   LABSPEC 1.019 03/06/2016 0157   PHURINE 5.5 03/06/2016 0157   GLUCOSEU 100* 03/06/2016 0157   HGBUR SMALL* 03/06/2016 0157   BILIRUBINUR NEGATIVE 03/06/2016 0157   KETONESUR NEGATIVE 03/06/2016 0157   PROTEINUR 30* 03/06/2016 0157   UROBILINOGEN 1.0 12/18/2012 1754   NITRITE NEGATIVE 03/06/2016 0157   LEUKOCYTESUR NEGATIVE 03/06/2016 0157   Recent Results (from the past 240 hour(s))  Surgical PCR screen     Status: None   Collection Time: 03/03/16  4:00 AM  Result Value Ref Range Status   MRSA, PCR NEGATIVE NEGATIVE Final   Staphylococcus aureus NEGATIVE NEGATIVE Final    Anti-infectives    Start     Dose/Rate Route Frequency Ordered Stop   03/03/16 0846  cefoTEtan in Dextrose 5% (CEFOTAN) 2-2.08 GM-% IVPB    Comments:  Forte, Lindsi   : cabinet override      03/03/16 0846 03/03/16 2059   03/03/16 0600  cefoTEtan (CEFOTAN) 2 g in dextrose 5 % 50 mL IVPB    Comments:  In OR holding pre op   2 g 100 mL/hr over 30 Minutes Intravenous On call to O.R. 03/02/16 1054 03/03/16 1009   03/02/16 1000  cefoTEtan (CEFOTAN) 2 g in dextrose 5 % 50 mL IVPB  Status:  Discontinued    Comments:  In OR holding pre op   2 g 100 mL/hr over 30 Minutes Intravenous Every 12 hours 03/02/16 0959 03/02/16 1054     Radiology Studies: Dg Chest 2 View  03/06/2016  CLINICAL DATA:  Shortness of breath and weakness EXAM: CHEST  2 VIEW COMPARISON:  12/17/2012 FINDINGS: The heart size appears normal. Aortic atherosclerosis noted.  Lung volumes are low. There are bilateral pleural effusions identified. Overlying platelike atelectasis identified. IMPRESSION: 1. Low lung volumes and bilateral pleural effusions. Electronically Signed   By: Kerby Moors M.D.   On: 03/06/2016 08:28    Scheduled Meds: . antiseptic oral rinse  7 mL Mouth Rinse q12n4p  . chlorhexidine  15 mL Mouth Rinse BID  . enoxaparin (LOVENOX) injection  40 mg Subcutaneous Daily  . HYDROmorphone   Intravenous Q4H  .  sodium chloride flush  3 mL Intravenous Q12H  . tamsulosin  0.4 mg Oral Daily   Continuous Infusions: . dextrose 5 % and 0.9% NaCl 50 mL/hr at 03/07/16 0358     LOS: 6 days    Time spent: 35 min  Faye Ramsay, DO [facility] Pager [phone]  If 7PM-7AM, please contact night-coverage [email or web address] Password [facility] 03/07/2016, 6:57 AM

## 2016-03-07 NOTE — Progress Notes (Signed)
Nutrition Follow-up  DOCUMENTATION CODES:   Non-severe (moderate) malnutrition in context of chronic illness  INTERVENTION:  Ensure Enlive TID. Each supplement provides 350 kcals and 20 grams of protein.   NUTRITION DIAGNOSIS:   Malnutrition related to chronic illness as evidenced by percent weight loss, moderate depletions of muscle mass (7.9% x 3 mo).  Ongoing   GOAL:   Patient will meet greater than or equal to 90% of their needs  Unmet  MONITOR:   PO intake, Supplement acceptance, Labs, Weight trends, Skin, I & O's, Diet advancement  ASSESSMENT:   Carolyn Barton is a 60 y.o. female with medical history significant of chronic back pain, thrombocytosis, depression, tachycardia, who presents with nausea, vomiting, abdominal pain and constipation.  S/p diverting sigmoid colostomy.   C/s for assessment of nutritional status.  Diet advanced to full liquids. Pt drinking Ensure at time of visit.  Pt reports getting Jello and ginger ale at breakfast but poor PO's because she does not like those foods. Pt likes Ensure and requests ice cream. Will order both.  Pt tolerating FLD well. No abdominal pain or N/V.   Pt is +8 L fluid and +27 lbs since admission. She reports swelling has gone down.  NFPE: reveals moderate muscle depletion, no fat depletion, and moderate edema.  Previous RD note: "Wt hx reviewed. UBW around 145#. Noted pt has experienced a 7.9% wt loss over the past 3 months, which is significant for time frame." Pt meets criteria for moderate malnutrition.   Labs reviewed; Na 134, K 2.9, BUN <5, Ca 7.8, Iron 6, TIBC 232. Meds reviewed.  Diet Order:  Diet full liquid Room service appropriate?: Yes; Fluid consistency:: Thin  Skin:  Reviewed, no issues  Last BM:  6/8  Height:   Ht Readings from Last 1 Encounters:  03/07/16 5\' 5"  (1.651 m)    Weight:   Wt Readings from Last 1 Encounters:  03/07/16 162 lb 3.2 oz (73.573 kg)    Ideal Body Weight:  56.8  kg  BMI:  Body mass index is 26.99 kg/(m^2).  Estimated Nutritional Needs:   Kcal:  1700-1900  Protein:  85-100 grams  Fluid:  1.7-1.9 L  EDUCATION NEEDS:   No education needs identified at this time  Geoffery Lyons, [place] Dietetic Intern Pager [phone]

## 2016-03-07 NOTE — Consult Note (Signed)
WOC ostomy follow up Stoma type/location: LLQ, end colostomy Stomal assessment/size: pink, moist, budded nicely from the skin Peristomal assessment: intact  Treatment options for stomal/peristomal skin: using 2" barrier ring Output paste dark brown/green stool Ostomy pouching: 2pc. 2 3/4" system Education provided: Husband performed all steps of the pouch change with minimal cuing.  Patient did attempt to open and close the pouch.  Discussed supplies and HHRN.  Education on routine care, emptying pouch.  Enrolled in [facility] program:  Yes   WOC will follow along with you for continued support with ostomy teaching and care [place] RN,CWOCN [id number]

## 2016-03-07 NOTE — Progress Notes (Signed)
Patient experienced one episode of vomiting during which she saturated the top of her abdominal dressing.  ABD dressing removed.  Patients wound has green and blue discharge with a foul odor.  Wound cleansed and redressed.  Patient has redness to her left abdomin at this time that was not present previously in the day.  Updated NP [place] on patients condition.  Received no new orders at this time.  Will continue to monitor.

## 2016-03-07 NOTE — Progress Notes (Signed)
Patient ID: Carolyn Barton, female   DOB: [birth year], 60 y.o.   MRN: [id number]     [facility]      [address] [place], [place] [address]    Phone: [phone] FAX: [phone]     Subjective: No n/v. Still tachy. bp stable. Tolerating clears.   Objective:  Vital signs:  Filed Vitals:   03/07/16 0319 03/07/16 0357 03/07/16 0738 03/07/16 0744  BP: 138/86  144/90   Pulse: 120  118   Temp: 98.6 F (37 C)  99.1 F (37.3 C)   TempSrc: Oral  Axillary   Resp: _0 Height: _1  (1.651 m)     Weight: 73.573 kg (162 lb 3.2 oz)     SpO2: 96% 96% 96% 96%    Last BM Date: 03/03/16  Intake/Output   Yesterday:  06/08 0701 - 06/09 0700 In: 2063.4 [P.O.:600; I.V.:1463.4] Out: 1219 [Urine:1125; Stool:300] This shift: I/O last 3 completed shifts: In: 3387.3 [P.O.:600; I.V.:2787.3] Out: 1975 [Urine:1525; Emesis/NG output:150; Stool:300]    Physical Exam: General: Pt awake/alert/oriented x4 in no  acute distress Abdomen: Soft.  Nondistended.  Tender around incision.  Midline wound with intact fascia, green tinged drainage on dressing, no odor, patches of necrosis.  No evidence of peritonitis.  No incarcerated hernias. llq ostomy is viable, stool and air present.    Problem List:   Principal Problem:   Mass of colon Active Problems:   Essential thrombocytosis (HCC)   Scoliosis of lumbar spine   Lumbar radiculopathy   Bowel obstruction (HCC)   Depression   Colonic mass   Nausea & vomiting   Abdominal pain    Results:   Labs: Results for orders placed or performed during the hospital encounter of 02/29/16 (from the past 48 hour(s))  Prealbumin     Status: Abnormal   Collection Time: 03/05/16 11:37 AM  Result Value Ref Range   Prealbumin <2 (L) 18 - 38 mg/dL  Urinalysis, Routine w reflex microscopic (not at [facility])     Status: Abnormal   Collection Time: 03/06/16  1:57 AM  Result Value Ref Range   Color,  Urine AMBER (A) YELLOW    Comment: BIOCHEMICALS MAY BE AFFECTED BY COLOR   APPearance CLEAR CLEAR   Specific Gravity, Urine 1.019 1.005 - 1.030   pH 5.5 5.0 - 8.0   Glucose, UA 100 (A) NEGATIVE mg/dL   Hgb urine dipstick SMALL (A) NEGATIVE   Bilirubin Urine NEGATIVE NEGATIVE   Ketones, ur NEGATIVE NEGATIVE mg/dL   Protein, ur 30 (A) NEGATIVE mg/dL   Nitrite NEGATIVE NEGATIVE   Leukocytes, UA NEGATIVE NEGATIVE  Urine microscopic-add on     Status: Abnormal   Collection Time: 03/06/16  1:57 AM  Result Value Ref Range   Squamous Epithelial / LPF 0-5 (A) NONE SEEN   WBC, UA NONE SEEN 0 - 5 WBC/hpf   RBC / HPF 0-5 0 - 5 RBC/hpf   Bacteria, UA RARE (A) NONE SEEN   Casts HYALINE CASTS (A) NEGATIVE  CBC     Status: Abnormal   Collection Time: 03/06/16  4:24 AM  Result Value Ref Range   WBC 11.7 (H) 4.0 - 10.5 K/uL   RBC 4.18 3.87 - 5.11 MIL/uL   Hemoglobin 9.0 (L) 12.0 - 15.0 g/dL    Comment: DELTA CHECK NOTED REPEATED TO VERIFY    HCT 29.3 (L) 36.0 - 46.0 %   MCV 70.1 (L) 78.0 -  100.0 fL   MCH 21.5 (L) 26.0 - 34.0 pg   MCHC 30.7 30.0 - 36.0 g/dL   RDW 16.1 (H) 11.5 - 15.5 %   Platelets 375 150 - 400 K/uL  Basic metabolic panel     Status: Abnormal   Collection Time: 03/06/16  4:24 AM  Result Value Ref Range   Sodium 134 (L) 135 - 145 mmol/L   Potassium 3.9 3.5 - 5.1 mmol/L   Chloride 108 101 - 111 mmol/L   CO2 21 (L) 22 - 32 mmol/L   Glucose, Bld 117 (H) 65 - 99 mg/dL   BUN 8 6 - 20 mg/dL   Creatinine, Ser 0.43 (L) 0.44 - 1.00 mg/dL   Calcium 7.8 (L) 8.9 - 10.3 mg/dL   GFR calc non Af Amer >60 >60 mL/min   GFR calc Af Amer >60 >60 mL/min    Comment: (NOTE) The eGFR has been calculated using the CKD EPI equation. This calculation has not been validated in all clinical situations. eGFR's persistently <60 mL/min signify possible Chronic Kidney Disease.    Anion gap 5 5 - 15  Lactic acid, plasma     Status: None   Collection Time: 03/06/16  5:00 AM  Result Value Ref  Range   Lactic Acid, Venous 1.8 0.5 - 2.0 mmol/L  CBC     Status: Abnormal   Collection Time: 03/07/16  3:44 AM  Result Value Ref Range   WBC 8.8 4.0 - 10.5 K/uL   RBC 4.21 3.87 - 5.11 MIL/uL   Hemoglobin 9.0 (L) 12.0 - 15.0 g/dL   HCT 28.7 (L) 36.0 - 46.0 %   MCV 68.2 (L) 78.0 - 100.0 fL   MCH 21.4 (L) 26.0 - 34.0 pg   MCHC 31.4 30.0 - 36.0 g/dL   RDW 16.1 (H) 11.5 - 15.5 %   Platelets 336 150 - 400 K/uL  Basic metabolic panel     Status: Abnormal   Collection Time: 03/07/16  3:44 AM  Result Value Ref Range   Sodium 134 (L) 135 - 145 mmol/L   Potassium 2.9 (L) 3.5 - 5.1 mmol/L    Comment: DELTA CHECK NOTED   Chloride 105 101 - 111 mmol/L   CO2 23 22 - 32 mmol/L   Glucose, Bld 97 65 - 99 mg/dL   BUN <5 (L) 6 - 20 mg/dL   Creatinine, Ser 0.44 0.44 - 1.00 mg/dL   Calcium 7.8 (L) 8.9 - 10.3 mg/dL   GFR calc non Af Amer >60 >60 mL/min   GFR calc Af Amer >60 >60 mL/min    Comment: (NOTE) The eGFR has been calculated using the CKD EPI equation. This calculation has not been validated in all clinical situations. eGFR's persistently <60 mL/min signify possible Chronic Kidney Disease.    Anion gap 6 5 - 15    Imaging / Studies: Dg Chest 2 View  03/06/2016  CLINICAL DATA:  Shortness of breath and weakness EXAM: CHEST  2 VIEW COMPARISON:  12/17/2012 FINDINGS: The heart size appears normal. Aortic atherosclerosis noted. Lung volumes are low. There are bilateral pleural effusions identified. Overlying platelike atelectasis identified. IMPRESSION: 1. Low lung volumes and bilateral pleural effusions. Electronically Signed   By: Kerby Moors M.D.   On: 03/06/2016 08:28    Medications / Allergies:  Scheduled Meds: . antiseptic oral rinse  7 mL Mouth Rinse q12n4p  . chlorhexidine  15 mL Mouth Rinse BID  . enoxaparin (LOVENOX) injection  40 mg Subcutaneous Daily  .  HYDROmorphone   Intravenous Q4H  . potassium chloride  10 mEq Intravenous Q1 Hr x 4  . sodium chloride flush  3 mL  Intravenous Q12H  . tamsulosin  0.4 mg Oral Daily   Continuous Infusions: . dextrose 5 % and 0.9% NaCl 50 mL/hr at 03/07/16 0358   PRN Meds:.acetaminophen **OR** acetaminophen, diphenhydrAMINE **OR** diphenhydrAMINE, HYDROmorphone (DILAUDID) injection, HYDROmorphone (DILAUDID) injection, ketorolac, LORazepam, methocarbamol (ROBAXIN)  IV, metoprolol, naloxone **AND** sodium chloride flush, ondansetron (ZOFRAN) IV  Antibiotics: Anti-infectives    Start     Dose/Rate Route Frequency Ordered Stop   03/03/16 0846  cefoTEtan in Dextrose 5% (CEFOTAN) 2-2.08 GM-% IVPB    Comments:  Forte, Lindsi   : cabinet override      03/03/16 0846 03/03/16 2059   03/03/16 0600  cefoTEtan (CEFOTAN) 2 g in dextrose 5 % 50 mL IVPB    Comments:  In OR holding pre op   2 g 100 mL/hr over 30 Minutes Intravenous On call to O.R. 03/02/16 1054 03/03/16 1009   03/02/16 1000  cefoTEtan (CEFOTAN) 2 g in dextrose 5 % 50 mL IVPB  Status:  Discontinued    Comments:  In OR holding pre op   2 g 100 mL/hr over 30 Minutes Intravenous Every 12 hours 03/02/16 0959 03/02/16 1054         Assessment/Plan Metastatic adenocarcinoma of the colon  POD#4 exploratory laparotomy, descending colostomy, mucus fistula, repair of chronic serosal tears---Dr. Brantley Stage 03/03/16 -advance to fulls.  DC PCA, resume methadone and dilaudid home dose, IV dilaudid for breakthrough to start  -mobilize, IS -TID wet to dry dressing changes Urinary retention-urology following. Foley replaced 6/8, on flomax.  Could consider voiding trial next 1-2 days   Hypotension/tachycardia-BP stable, still tachy, control pain. PRN metoprolol per primary team.  FEN-fulls, dietician consult.  K 2.9, supplemented.  VTE prophylaxis-scd, lovenox Dispo-SDU  Erby Pian, [facility] Pager [phone]) For consults and floor pages call [phone](7A-4:30P)  03/07/2016 8:47 AM

## 2016-03-08 DIAGNOSIS — K5649 Other impaction of intestine: Secondary | ICD-10-CM

## 2016-03-08 LAB — CBC
HEMATOCRIT: 28.7 % — AB (ref 36.0–46.0)
Hemoglobin: 9.1 g/dL — ABNORMAL LOW (ref 12.0–15.0)
MCH: 21.3 pg — ABNORMAL LOW (ref 26.0–34.0)
MCHC: 31.7 g/dL (ref 30.0–36.0)
MCV: 67.1 fL — ABNORMAL LOW (ref 78.0–100.0)
Platelets: 312 10*3/uL (ref 150–400)
RBC: 4.28 MIL/uL (ref 3.87–5.11)
RDW: 16.1 % — AB (ref 11.5–15.5)
WBC: 6.8 10*3/uL (ref 4.0–10.5)

## 2016-03-08 LAB — BASIC METABOLIC PANEL
ANION GAP: 7 (ref 5–15)
BUN: 7 mg/dL (ref 6–20)
CALCIUM: 7.8 mg/dL — AB (ref 8.9–10.3)
CHLORIDE: 103 mmol/L (ref 101–111)
CO2: 23 mmol/L (ref 22–32)
CREATININE: 0.46 mg/dL (ref 0.44–1.00)
GFR calc non Af Amer: >60 mL/min (ref >60)
Glucose, Bld: 95 mg/dL (ref 65–99)
Potassium: 2.7 mmol/L — CL (ref 3.5–5.1)
SODIUM: 133 mmol/L — AB (ref 135–145)

## 2016-03-08 LAB — GLUCOSE, CAPILLARY: GLUCOSE-CAPILLARY: 92 mg/dL (ref 65–99)

## 2016-03-08 MED ORDER — POTASSIUM CHLORIDE 10 MEQ/100ML IV SOLN
10.0000 meq | INTRAVENOUS | Status: AC
  Administered 2016-03-08 (×6): 10 meq via INTRAVENOUS
  Filled 2016-03-08 (×5): qty 100

## 2016-03-08 MED ORDER — DAKINS (1/4 STRENGTH) 0.125 % EX SOLN
Freq: Every day | CUTANEOUS | Status: AC
  Administered 2016-03-08 – 2016-03-10 (×3)
  Filled 2016-03-08: qty 473

## 2016-03-08 MED ORDER — FUROSEMIDE 10 MG/ML IJ SOLN
20.0000 mg | Freq: Once | INTRAMUSCULAR | Status: AC
  Administered 2016-03-08: 20 mg via INTRAVENOUS
  Filled 2016-03-08: qty 2

## 2016-03-08 NOTE — Progress Notes (Addendum)
PROGRESS NOTE    Carolyn Barton  [id number] DOB: 01-Sep-1956 DOA: 02/29/2016   PCP: Marton Redwood, MD   Brief Narrative:  60 y.o. female with chronic back pain, thrombocytosis, depression, tachycardia, who presented with nausea, vomiting, abdominal pain and constipation. Imaging studies notable for colonic mass in distal sigmoid colon, measuring 6.6 x 3.2 x 3.8 cm, resulting in partial obstruction, compatible with primary colonic malignancy, small adjacent nodules, concerning for local spread of disease. Pt is now S/p resection on 6/5.  Assessment & Plan:   Colonic mass - Obstructing large bowel mass, likely adenocarcinoma with metastasis to liver  - POD# 5 exploratory laparotomy, descending colostomy, mucus fistula by Dr. Brantley Stage 03/03/16 - advanced diet to full liquids 6/9 but after Ensure, pt had some vomiting so stopped - if this continues will have to back down to clears  - provide analgesia as needed  - encourage OOB to chair and ambulating with assistance  - appreciate surgery team following   Hypotension/tachycardia - appears to be reactive and I suspect that extra volume also contributing to tachycardia  - added low dose metoprolol as needed for HR > 110 - pt is on lasix 20 mg PO QD at home, I will give her Lasix 20 mg here to see if that will help a little bit with diuresis  - keep in SDU for now   Pleural effusions, anasarca - noted on CXR pleural effusions and with lower and upper extremity swelling  - there is some discrepancy in weight measures: 135 --> 127 --> 160 --> 162 --> 132 lbs this AM - will ask nurse to recheck weight  - at baseline pt is about 130 lbs   Leukocytosis - from leukopenia to leukocytosis in 24 hours: 2.8 --> 11.7 - lactic acid WNL and UA clear, CXR with no indication of PNA 6/8 - WBC now WNL  6.8 - CBC in AM  Moderate protein calorie malnutrition  - nutritionist consulted   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 - resumed methadone    Thrombocytosis, essential  - resolved  - CBC in AM  Hypokalemia  - continue to supplement  - check Mg level  - BMP in AM  N/V - improving - allow zofran as needed  - hold off on ensure for now  Urinary retention - placed foley - pt reports feeling better   DVT prophylaxis:  SCD's  Code Status: Full Code  Family Communication: Husband at bedside  Disposition Plan:  Home once cleared by surgery team   Consultants:   Surgery   Oncology   Procedures:   Colonic resection  Subjective: Fast HR still this AM.   Objective: Filed Vitals:   03/08/16 0313 03/08/16 0724 03/08/16 1031 03/08/16 1100  BP: 137/92     Pulse: 103     Temp: 98.2 F (36.8 C) 98.2 F (36.8 C)  98.1 F (36.7 C)  TempSrc: Oral Oral  Oral  Resp: 28     Height:      Weight:   59.966 kg (132 lb 3.2 oz)   SpO2: 97%       Intake/Output Summary (Last 24 hours) at 03/08/16 1233 Last data filed at 03/08/16 0900  Gross per 24 hour  Intake    200 ml  Output    900 ml  Net   -700 ml   Filed Weights   03/06/16 0446 03/07/16 0319 03/08/16 1031  Weight: 72.848 kg (160 lb 9.6 oz) 73.573 kg (162 lb 3.2 oz)  59.966 kg (132 lb 3.2 oz)    Examination:  General exam: more awake and interactive today, diffuse anasarca  Respiratory system: mild tachypnea with diminished breath sounds at bases  Cardiovascular system: S1 & S2 heard, tachy-sinus Gastrointestinal system: non tender, open site looks ok, bit dry, no tenderness with dressing changes  Central nervous system: Alert and oriented. No focal neurological deficits. Extremities: Lower and upper extremity edema improving   Data Reviewed: I have personally reviewed following labs and imaging studies  CBC:  Recent Labs Lab 03/03/16 2120 03/04/16 0959 03/06/16 0424 03/07/16 0344 03/08/16 0252  WBC 2.8* 11.7* 11.7* 8.8 6.8  HGB 12.1 13.0 9.0* 9.0* 9.1*  HCT 39.9 41.0 29.3* 28.7* 28.7*  MCV 71.1* 72.1* 70.1* 68.2* 67.1*  PLT 552* 461*  375 336 [id number]   Basic Metabolic Panel:  Recent Labs Lab 03/03/16 2306 03/04/16 0959 03/06/16 0424 03/07/16 0344 03/08/16 0252  NA 133* 135 134* 134* 133*  K 4.6 4.6 3.9 2.9* 2.7*  CL 104 107 108 105 103  CO2 19* 18* 21* 23 23  GLUCOSE 125* 97 117* 97 95  BUN 8 12 8  <5* 7  CREATININE 0.77 0.66 0.43* 0.44 0.46  CALCIUM 8.0* 7.6* 7.8* 7.8* 7.8*  MG 1.2* 1.8  --   --   --    Liver Function Tests:  Recent Labs Lab 03/02/16 0449 03/03/16 0634  AST 17 15  ALT 16 15  ALKPHOS 69 65  BILITOT 0.7 0.7  PROT 5.5* 5.5*  ALBUMIN 2.8* 2.9*   CBG:  Recent Labs Lab 03/02/16 0818 03/02/16 1152 03/03/16 0748 03/07/16 0738 03/08/16 0722  GLUCAP 49* 127* 79 89 92   Urine analysis:    Component Value Date/Time   COLORURINE AMBER* 03/06/2016 0157   APPEARANCEUR CLEAR 03/06/2016 0157   LABSPEC 1.019 03/06/2016 0157   PHURINE 5.5 03/06/2016 0157   GLUCOSEU 100* 03/06/2016 0157   HGBUR SMALL* 03/06/2016 0157   BILIRUBINUR NEGATIVE 03/06/2016 0157   KETONESUR NEGATIVE 03/06/2016 0157   PROTEINUR 30* 03/06/2016 0157   UROBILINOGEN 1.0 12/18/2012 1754   NITRITE NEGATIVE 03/06/2016 0157   LEUKOCYTESUR NEGATIVE 03/06/2016 0157   Recent Results (from the past 240 hour(s))  Surgical PCR screen     Status: None   Collection Time: 03/03/16  4:00 AM  Result Value Ref Range Status   MRSA, PCR NEGATIVE NEGATIVE Final   Staphylococcus aureus NEGATIVE NEGATIVE Final    Anti-infectives    Start     Dose/Rate Route Frequency Ordered Stop   03/03/16 0846  cefoTEtan in Dextrose 5% (CEFOTAN) 2-2.08 GM-% IVPB    Comments:  Forte, Lindsi   : cabinet override      03/03/16 0846 03/03/16 2059   03/03/16 0600  cefoTEtan (CEFOTAN) 2 g in dextrose 5 % 50 mL IVPB    Comments:  In OR holding pre op   2 g 100 mL/hr over 30 Minutes Intravenous On call to O.R. 03/02/16 1054 03/03/16 1009   03/02/16 1000  cefoTEtan (CEFOTAN) 2 g in dextrose 5 % 50 mL IVPB  Status:  Discontinued    Comments:  In OR  holding pre op   2 g 100 mL/hr over 30 Minutes Intravenous Every 12 hours 03/02/16 0959 03/02/16 1054     Radiology Studies: No results found.  Scheduled Meds: . antiseptic oral rinse  7 mL Mouth Rinse q12n4p  . chlorhexidine  15 mL Mouth Rinse BID  . DULoxetine  60 mg Oral Daily  . enoxaparin (  LOVENOX) injection  40 mg Subcutaneous Daily  . feeding supplement (ENSURE ENLIVE)  237 mL Oral TID BM  . furosemide  20 mg Intravenous Once  . methadone  10 mg Oral TID  . sodium chloride flush  3 mL Intravenous Q12H  . sodium hypochlorite   Irrigation Daily  . tamsulosin  0.4 mg Oral Daily   Continuous Infusions:     LOS: 7 days    Time spent: 35 min  Faye Ramsay, DO [facility] Pager [phone]  If 7PM-7AM, please contact night-coverage [email or web address] Password [facility] 03/08/2016, 12:33 PM

## 2016-03-08 NOTE — Progress Notes (Signed)
Patient sat in the chair for three hours. She was able to stand and walk in room. She got on the standing scale to clarify weight which is documented in flow sheets. Pt stated after she rests awhile she will get up again. Dressing changed and abdominal incision irrigated with dankin's solution per MD order. Pt tolerated well.

## 2016-03-08 NOTE — Progress Notes (Signed)
Patient ID: Carolyn Barton, female   DOB: 1955/11/17, 60 y.o.   MRN: [id number]     [facility]      [address] [place], [place] [address]    Phone: [phone] FAX: [phone]     Subjective: Vomited yesterday. Had ensure and tolerated well thereafter. Pain okay. Walked x1 yesterday. Still tachy.  bp stable. Normal WBC. Afebrile.    Objective:  Vital signs:  Filed Vitals:   03/07/16 2256 03/07/16 2306 03/08/16 0313 03/08/16 0724  BP:  155/89 137/92   Pulse: 119 112 103   Temp:  99.8 F (37.7 C) 98.2 F (36.8 C) 98.2 F (36.8 C)  TempSrc:  Oral Oral Oral  Resp: '23 24 28   '$ Height:      Weight:      SpO2: 97% 97% 97%     Last BM Date: 03/06/16  Intake/Output   Yesterday:  06/09 0701 - 06/10 0700 In: -  Out: 1100 [Urine:700; Stool:400] This shift:    Physical Exam: General: Pt awake/alert/oriented x4 in no acute distress Abdomen: Soft. Nondistended. Tender around incision. Midline wound with intact fascia, green tinged drainage on dressing, no odor, patches of necrosis. No evidence of peritonitis. No incarcerated hernias. llq ostomy is viable, stool and air present.    Problem List:   Principal Problem:   Mass of colon Active Problems:   Essential thrombocytosis (HCC)   Scoliosis of lumbar spine   Lumbar radiculopathy   Bowel obstruction (HCC)   Depression   Colonic mass   Nausea & vomiting   Abdominal pain   Malnutrition of moderate degree    Results:   Labs: Results for orders placed or performed during the hospital encounter of 02/29/16 (from the past 48 hour(s))  CBC     Status: Abnormal   Collection Time: 03/07/16  3:44 AM  Result Value Ref Range   WBC 8.8 4.0 - 10.5 K/uL   RBC 4.21 3.87 - 5.11 MIL/uL   Hemoglobin 9.0 (L) 12.0 - 15.0 g/dL   HCT 28.7 (L) 36.0 - 46.0 %   MCV 68.2 (L) 78.0 - 100.0 fL   MCH 21.4 (L) 26.0 - 34.0 pg   MCHC 31.4 30.0 - 36.0 g/dL   RDW 16.1 (H) 11.5 -  15.5 %   Platelets 336 150 - 400 K/uL  Basic metabolic panel     Status: Abnormal   Collection Time: 03/07/16  3:44 AM  Result Value Ref Range   Sodium 134 (L) 135 - 145 mmol/L   Potassium 2.9 (L) 3.5 - 5.1 mmol/L    Comment: DELTA CHECK NOTED   Chloride 105 101 - 111 mmol/L   CO2 23 22 - 32 mmol/L   Glucose, Bld 97 65 - 99 mg/dL   BUN <5 (L) 6 - 20 mg/dL   Creatinine, Ser 0.44 0.44 - 1.00 mg/dL   Calcium 7.8 (L) 8.9 - 10.3 mg/dL   GFR calc non Af Amer >60 >60 mL/min   GFR calc Af Amer >60 >60 mL/min    Comment: (NOTE) The eGFR has been calculated using the CKD EPI equation. This calculation has not been validated in all clinical situations. eGFR's persistently <60 mL/min signify possible Chronic Kidney Disease.    Anion gap 6 5 - 15  Glucose, capillary     Status: None   Collection Time: 03/07/16  7:38 AM  Result Value Ref Range   Glucose-Capillary 89 65 - 99 mg/dL  Comment 1 Notify RN   CBC     Status: Abnormal   Collection Time: 03/08/16  2:52 AM  Result Value Ref Range   WBC 6.8 4.0 - 10.5 K/uL   RBC 4.28 3.87 - 5.11 MIL/uL   Hemoglobin 9.1 (L) 12.0 - 15.0 g/dL   HCT 28.7 (L) 36.0 - 46.0 %   MCV 67.1 (L) 78.0 - 100.0 fL   MCH 21.3 (L) 26.0 - 34.0 pg   MCHC 31.7 30.0 - 36.0 g/dL   RDW 16.1 (H) 11.5 - 15.5 %   Platelets 312 150 - 400 K/uL  Basic metabolic panel     Status: Abnormal   Collection Time: 03/08/16  2:52 AM  Result Value Ref Range   Sodium 133 (L) 135 - 145 mmol/L   Potassium 2.7 (LL) 3.5 - 5.1 mmol/L    Comment: CRITICAL RESULT CALLED TO, READ BACK BY AND VERIFIED WITH: AMUNDSON K,RN 03/08/16 0326 WAYK    Chloride 103 101 - 111 mmol/L   CO2 23 22 - 32 mmol/L   Glucose, Bld 95 65 - 99 mg/dL   BUN 7 6 - 20 mg/dL   Creatinine, Ser 0.46 0.44 - 1.00 mg/dL   Calcium 7.8 (L) 8.9 - 10.3 mg/dL   GFR calc non Af Amer >60 >60 mL/min   GFR calc Af Amer >60 >60 mL/min    Comment: (NOTE) The eGFR has been calculated using the CKD EPI equation. This  calculation has not been validated in all clinical situations. eGFR's persistently <60 mL/min signify possible Chronic Kidney Disease.    Anion gap 7 5 - 15  Glucose, capillary     Status: None   Collection Time: 03/08/16  7:22 AM  Result Value Ref Range   Glucose-Capillary 92 65 - 99 mg/dL   Comment 1 Notify RN     Imaging / Studies: No results found.  Medications / Allergies:  Scheduled Meds: . antiseptic oral rinse  7 mL Mouth Rinse q12n4p  . chlorhexidine  15 mL Mouth Rinse BID  . DULoxetine  60 mg Oral Daily  . enoxaparin (LOVENOX) injection  40 mg Subcutaneous Daily  . feeding supplement (ENSURE ENLIVE)  237 mL Oral TID BM  . methadone  10 mg Oral TID  . potassium chloride  10 mEq Intravenous Q1 Hr x 6  . sodium chloride flush  3 mL Intravenous Q12H  . tamsulosin  0.4 mg Oral Daily   Continuous Infusions:  PRN Meds:.acetaminophen **OR** acetaminophen, HYDROmorphone (DILAUDID) injection, HYDROmorphone, ketorolac, LORazepam, methocarbamol (ROBAXIN)  IV, metoprolol  Antibiotics: Anti-infectives    Start     Dose/Rate Route Frequency Ordered Stop   03/03/16 0846  cefoTEtan in Dextrose 5% (CEFOTAN) 2-2.08 GM-% IVPB    Comments:  Forte, Lindsi   : cabinet override      03/03/16 0846 03/03/16 2059   03/03/16 0600  cefoTEtan (CEFOTAN) 2 g in dextrose 5 % 50 mL IVPB    Comments:  In OR holding pre op   2 g 100 mL/hr over 30 Minutes Intravenous On call to O.R. 03/02/16 1054 03/03/16 1009   03/02/16 1000  cefoTEtan (CEFOTAN) 2 g in dextrose 5 % 50 mL IVPB  Status:  Discontinued    Comments:  In OR holding pre op   2 g 100 mL/hr over 30 Minutes Intravenous Every 12 hours 03/02/16 0959 03/02/16 1054        Assessment/Plan Metastatic adenocarcinoma of the colon  POD#5 exploratory laparotomy, descending colostomy, mucus fistula,  repair of chronic serosal tears---Dr. Brantley Stage 03/03/16 -continue with fulls, if vomiting recurs back down to clears. Continue with methadone, PRN  dilaudid -mobilize, IS -TID wet to dry dressing changes, add dakin's, reassess wound in am.  Urinary retention-urology following. Foley replaced 6/8, on flomax. Could consider voiding trial next 1-2 days when more ambulatory Hypotension/tachycardia-BP stable, still tachy, control pain. PRN metoprolol per primary team.  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  ensure FEN-fulls K 2.7, supplemented.  VTE prophylaxis-scd, lovenox Dispo-SDU   Erby Pian, [facility] Surgery Pager [phone])   03/08/2016 8:56 AM

## 2016-03-08 NOTE — Progress Notes (Addendum)
CRITICAL VALUE ALERT  Critical value received:  Potassium 2.7  Date of notification:  03/08/2016  Time of notification:  [id number]  Critical value read back:Yes.    Nurse who received alert:  Fara Chute  MD notified (1st page):  NP Donnal Debar  Time of first page:  0420  MD notified (2nd page):  Time of second page:  Responding MD:  NP Donnal Debar   Time MD responded:  0430   Patient Potassium 2.7. NP notified and ordered Potassium chloride 10 mEq in 100 mL every hour x 6. RN will continue to monitor.

## 2016-03-08 NOTE — Progress Notes (Signed)
Patient up to chair and has agreed to sit for about an hour. Encouraged patient to be up more today and will also attempt to get in two walks today.

## 2016-03-09 DIAGNOSIS — E876 Hypokalemia: Secondary | ICD-10-CM

## 2016-03-09 LAB — CBC
HCT: 27.9 % — ABNORMAL LOW (ref 36.0–46.0)
HEMOGLOBIN: 9 g/dL — AB (ref 12.0–15.0)
MCH: 21.4 pg — ABNORMAL LOW (ref 26.0–34.0)
MCHC: 32.3 g/dL (ref 30.0–36.0)
MCV: 66.4 fL — ABNORMAL LOW (ref 78.0–100.0)
Platelets: 320 10*3/uL (ref 150–400)
RBC: 4.2 MIL/uL (ref 3.87–5.11)
RDW: 16.2 % — ABNORMAL HIGH (ref 11.5–15.5)
WBC: 8.3 10*3/uL (ref 4.0–10.5)

## 2016-03-09 LAB — BASIC METABOLIC PANEL
ANION GAP: 8 (ref 5–15)
BUN: 7 mg/dL (ref 6–20)
CHLORIDE: 99 mmol/L — AB (ref 101–111)
CO2: 23 mmol/L (ref 22–32)
CREATININE: 0.4 mg/dL — AB (ref 0.44–1.00)
Calcium: 7.8 mg/dL — ABNORMAL LOW (ref 8.9–10.3)
GFR calc non Af Amer: >60 mL/min (ref >60)
Glucose, Bld: 71 mg/dL (ref 65–99)
POTASSIUM: 2.8 mmol/L — AB (ref 3.5–5.1)
SODIUM: 130 mmol/L — AB (ref 135–145)

## 2016-03-09 LAB — MAGNESIUM: MAGNESIUM: 1.5 mg/dL — AB (ref 1.7–2.4)

## 2016-03-09 LAB — GLUCOSE, CAPILLARY
GLUCOSE-CAPILLARY: 66 mg/dL (ref 65–99)
Glucose-Capillary: 101 mg/dL — ABNORMAL HIGH (ref 65–99)

## 2016-03-09 MED ORDER — FUROSEMIDE 10 MG/ML IJ SOLN
20.0000 mg | Freq: Once | INTRAMUSCULAR | Status: AC
  Administered 2016-03-09: 20 mg via INTRAVENOUS
  Filled 2016-03-09: qty 2

## 2016-03-09 MED ORDER — MAGNESIUM SULFATE 2 GM/50ML IV SOLN
2.0000 g | Freq: Once | INTRAVENOUS | Status: AC
Start: 2016-03-09 — End: 2016-03-09
  Administered 2016-03-09: 2 g via INTRAVENOUS
  Filled 2016-03-09: qty 50

## 2016-03-09 MED ORDER — POTASSIUM CHLORIDE 20 MEQ/15ML (10%) PO SOLN
40.0000 meq | Freq: Three times a day (TID) | ORAL | Status: DC
  Administered 2016-03-09 – 2016-03-10 (×6): 40 meq via ORAL
  Filled 2016-03-09 (×6): qty 30

## 2016-03-09 MED ORDER — POTASSIUM CHLORIDE CRYS ER 20 MEQ PO TBCR
40.0000 meq | EXTENDED_RELEASE_TABLET | Freq: Three times a day (TID) | ORAL | Status: DC
  Filled 2016-03-09: qty 2

## 2016-03-09 NOTE — Progress Notes (Signed)
Pt's abdominal dressing changed at bedside with Erby Pian NP. Pt tolerated well. Consuelo Pandy RN

## 2016-03-09 NOTE — Progress Notes (Signed)
Patient ID: Carolyn Barton, female   DOB: 1955/10/20, 60 y.o.   MRN: [id number]     [facility]      [address] [place], [place] [address]    Phone: [phone] FAX: [phone]     Subjective: Not eating bs 66.  No n/v.  No appetite. Ostomy is functioning.   Objective:  Vital signs:  Filed Vitals:   03/08/16 1449 03/08/16 2045 03/08/16 2354 03/09/16 0336  BP: 135/83 138/81 128/73 128/75  Pulse: 100 112 118 123  Temp:   97.9 F (36.6 C) 98 F (36.7 C)  TempSrc:   Oral Oral  Resp: _0 Height:      Weight:      SpO2: 98% 97% 94% 96%    Last BM Date: 03/08/16  Intake/Output   Yesterday:  06/10 0701 - 06/11 0700 In: 560 [P.O.:560] Out: 2425 [Urine:2275; Stool:150] This shift:     Physical Exam: General: Pt awake/alert/oriented x4 in no acute distress Abdomen: Soft. Nondistended. Tender around incision. Midline wound with intact fascia, green tinged drainage on dressing, patches of necrosis. No evidence of peritonitis. No incarcerated hernias. llq ostomy is viable, stool and air present.    Problem List:   Principal Problem:   Mass of colon Active Problems:   Essential thrombocytosis (HCC)   Scoliosis of lumbar spine   Lumbar radiculopathy   Bowel obstruction (HCC)   Depression   Colonic mass   Nausea & vomiting   Abdominal pain   Malnutrition of moderate degree    Results:   Labs: Results for orders placed or performed during the hospital encounter of 02/29/16 (from the past 48 hour(s))  CBC     Status: Abnormal   Collection Time: 03/08/16  2:52 AM  Result Value Ref Range   WBC 6.8 4.0 - 10.5 K/uL   RBC 4.28 3.87 - 5.11 MIL/uL   Hemoglobin 9.1 (L) 12.0 - 15.0 g/dL   HCT 28.7 (L) 36.0 - 46.0 %   MCV 67.1 (L) 78.0 - 100.0 fL   MCH 21.3 (L) 26.0 - 34.0 pg   MCHC 31.7 30.0 - 36.0 g/dL   RDW 16.1 (H) 11.5 - 15.5 %   Platelets 312 150 - 400 K/uL  Basic metabolic panel     Status:  Abnormal   Collection Time: 03/08/16  2:52 AM  Result Value Ref Range   Sodium 133 (L) 135 - 145 mmol/L   Potassium 2.7 (LL) 3.5 - 5.1 mmol/L    Comment: CRITICAL RESULT CALLED TO, READ BACK BY AND VERIFIED WITH: AMUNDSON K,RN 03/08/16 0326 WAYK    Chloride 103 101 - 111 mmol/L   CO2 23 22 - 32 mmol/L   Glucose, Bld 95 65 - 99 mg/dL   BUN 7 6 - 20 mg/dL   Creatinine, Ser 0.46 0.44 - 1.00 mg/dL   Calcium 7.8 (L) 8.9 - 10.3 mg/dL   GFR calc non Af Amer >60 >60 mL/min   GFR calc Af Amer >60 >60 mL/min    Comment: (NOTE) The eGFR has been calculated using the CKD EPI equation. This calculation has not been validated in all clinical situations. eGFR's persistently <60 mL/min signify possible Chronic Kidney Disease.    Anion gap 7 5 - 15  Glucose, capillary     Status: None   Collection Time: 03/08/16  7:22 AM  Result Value Ref Range   Glucose-Capillary 92 65 - 99 mg/dL  Comment 1 Notify RN   CBC     Status: Abnormal   Collection Time: 03/09/16  5:04 AM  Result Value Ref Range   WBC 8.3 4.0 - 10.5 K/uL   RBC 4.20 3.87 - 5.11 MIL/uL   Hemoglobin 9.0 (L) 12.0 - 15.0 g/dL   HCT 27.9 (L) 36.0 - 46.0 %   MCV 66.4 (L) 78.0 - 100.0 fL   MCH 21.4 (L) 26.0 - 34.0 pg   MCHC 32.3 30.0 - 36.0 g/dL   RDW 16.2 (H) 11.5 - 15.5 %   Platelets 320 150 - 400 K/uL  Basic metabolic panel     Status: Abnormal   Collection Time: 03/09/16  5:04 AM  Result Value Ref Range   Sodium 130 (L) 135 - 145 mmol/L   Potassium 2.8 (L) 3.5 - 5.1 mmol/L   Chloride 99 (L) 101 - 111 mmol/L   CO2 23 22 - 32 mmol/L   Glucose, Bld 71 65 - 99 mg/dL   BUN 7 6 - 20 mg/dL   Creatinine, Ser 0.40 (L) 0.44 - 1.00 mg/dL   Calcium 7.8 (L) 8.9 - 10.3 mg/dL   GFR calc non Af Amer >60 >60 mL/min   GFR calc Af Amer >60 >60 mL/min    Comment: (NOTE) The eGFR has been calculated using the CKD EPI equation. This calculation has not been validated in all clinical situations. eGFR's persistently <60 mL/min signify possible  Chronic Kidney Disease.    Anion gap 8 5 - 15  Magnesium     Status: Abnormal   Collection Time: 03/09/16  5:04 AM  Result Value Ref Range   Magnesium 1.5 (L) 1.7 - 2.4 mg/dL    Imaging / Studies: No results found.  Medications / Allergies:  Scheduled Meds: . antiseptic oral rinse  7 mL Mouth Rinse q12n4p  . chlorhexidine  15 mL Mouth Rinse BID  . DULoxetine  60 mg Oral Daily  . enoxaparin (LOVENOX) injection  40 mg Subcutaneous Daily  . feeding supplement (ENSURE ENLIVE)  237 mL Oral TID BM  . magnesium sulfate 1 - 4 g bolus IVPB  2 g Intravenous Once  . methadone  10 mg Oral TID  . potassium chloride  40 mEq Oral TID  . sodium chloride flush  3 mL Intravenous Q12H  . sodium hypochlorite   Irrigation Daily  . tamsulosin  0.4 mg Oral Daily   Continuous Infusions:  PRN Meds:.acetaminophen **OR** acetaminophen, HYDROmorphone (DILAUDID) injection, LORazepam, methocarbamol (ROBAXIN)  IV, metoprolol  Antibiotics: Anti-infectives    Start     Dose/Rate Route Frequency Ordered Stop   03/03/16 0846  cefoTEtan in Dextrose 5% (CEFOTAN) 2-2.08 GM-% IVPB    Comments:  Forte, Lindsi   : cabinet override      03/03/16 0846 03/03/16 2059   03/03/16 0600  cefoTEtan (CEFOTAN) 2 g in dextrose 5 % 50 mL IVPB    Comments:  In OR holding pre op   2 g 100 mL/hr over 30 Minutes Intravenous On call to O.R. 03/02/16 1054 03/03/16 1009   03/02/16 1000  cefoTEtan (CEFOTAN) 2 g in dextrose 5 % 50 mL IVPB  Status:  Discontinued    Comments:  In OR holding pre op   2 g 100 mL/hr over 30 Minutes Intravenous Every 12 hours 03/02/16 0959 03/02/16 1054         Assessment/Plan Metastatic adenocarcinoma of the colon  POD#6 exploratory laparotomy, descending colostomy, mucus fistula, repair of chronic serosal tears---Dr. Brantley Stage  03/03/16 -change to regular diet, encouraged PO intake.  She is not eating anything, prealbumin <2.  I recommended a cortrak until appetite increases, but pt declined.  Will  see how she does with a regular diet +shakes. -Continue with methadone, PRN dilaudid -mobilize, IS -suspect pseudomonas to wound, Dakin's started 6/10, check daily.  Urinary retention-urology following. Foley replaced 6/8, on flomax.voiding trial tomorrow Hypotension/tachycardia-BP stable, still tachy, control pain. PRN metoprolol per primary team.  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 see above FEN-give 40kcl PO x3 doses today, give 2g Mg.  Repeat labs in AM.  VTE prophylaxis-scd, lovenox Dispo-SDU   Erby Pian, ANP-BC [facility]   03/09/2016 8:08 AM

## 2016-03-09 NOTE — Progress Notes (Signed)
Pt's blood sugar was 66, pt given 261ml of Coke to drink. Pt's blood sugar was rechecked and had increased to 101. Emina Riebock NP at bedside and aware of blood sugar. Consuelo Pandy RN

## 2016-03-09 NOTE — Progress Notes (Signed)
PROGRESS NOTE    Carolyn Barton  [id number] DOB: 05-14-1956 DOA: 02/29/2016   PCP: Marton Redwood, MD   Brief Narrative:  60 y.o. female with chronic back pain, thrombocytosis, depression, tachycardia, who presented with nausea, vomiting, abdominal pain and constipation. Imaging studies notable for colonic mass in distal sigmoid colon, measuring 6.6 x 3.2 x 3.8 cm, resulting in partial obstruction, compatible with primary colonic malignancy, small adjacent nodules, concerning for local spread of disease. Pt is now S/p resection on 6/5.  Assessment & Plan:   Colonic mass - Obstructing large bowel mass, likely adenocarcinoma with metastasis to liver  - POD# 6 exploratory laparotomy, descending colostomy, mucus fistula by Dr. Brantley Stage 03/03/16 - tolerated full liquids, diet now advanced to regular and pt so far tolerating well  - provide analgesia as needed  - encourage OOB to chair and ambulating with assistance  - appreciate surgery team following   ? Cellulitis - erythema and TTP extending from the outside the wounds site at the umbilical level and towards the left flank area - pt with no fever or leukocytosis but will still need to keep close eye on this  - I have outlined the borders of the erythmea - asked surgery team to look at this as well to address if pt would benefit from abx   Hypotension/tachycardia - appears to be reactive and I suspect that extra volume also contributing to tachycardia  - keep on metoprolol as needed for HR > 110 - gave one dose of Lasix 20 mg IV 6/10 and pt's weight is down from 162 --> 159 lbs this AM - give one more dose today Lasix 20 mg IV  - keep in SDU for now   Pleural effusions, anasarca - noted on CXR pleural effusions and with lower and upper extremity swelling  - there is some discrepancy in weight measures: 135 --> 127 --> 160 --> 162 --> 132 lbs this AM - asked to nurse to get standing weight, pt is 159 lbs this AM  - continue to  monitor daily weights, strict I/O  Leukocytosis - from leukopenia to leukocytosis in 24 hours: 2.8 --> 11.7 --> 8.3 - resolved - CBC In AM  Moderate protein calorie malnutrition  - nutritionist consulted   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 - resumed methadone  - pain controlled for now  Thrombocytosis, essential  - resolved  - CBC in AM  Hypokalemia and hypomagnesemia - supplement both electrolytes and repeat BMP and Mg in AM  N/V - improving - allow zofran as needed   Urinary retention - placed foley  DVT prophylaxis:  SCD's  Code Status: Full Code  Family Communication: Husband at bedside, his questions were answered   Disposition Plan:  Home once cleared by surgery team   Consultants:   Surgery   Oncology   Procedures:   Colonic resection  Subjective: Fast HR still this AM. Some TTP at the left abd area.   Objective: Filed Vitals:   03/08/16 2354 03/09/16 0336 03/09/16 0744 03/09/16 0745  BP: 128/73 128/75 127/76   Pulse: 118 123 119   Temp: 97.9 F (36.6 C) 98 F (36.7 C)  98.4 F (36.9 C)  TempSrc: Oral Oral  Oral  Resp: 19 23 27    Height:      Weight:      SpO2: 94% 96% 94%     Intake/Output Summary (Last 24 hours) at 03/09/16 1151 Last data filed at 03/09/16 0900  Gross per 24 hour  Intake    600 ml  Output   2250 ml  Net  -1650 ml   Filed Weights   03/07/16 0319 03/08/16 1031 03/08/16 1400  Weight: 73.573 kg (162 lb 3.2 oz) 59.966 kg (132 lb 3.2 oz) 72.576 kg (160 lb)    Examination:  General exam: more awake and interactive today Respiratory system: mild tachypnea with diminished breath sounds at bases and crackles  Cardiovascular system: S1 & S2 heard, tachy-sinus Gastrointestinal system: non tender, open site looks ok, bit dry, at the umbilical level and outside the wounds border there is an area of erythema and TTP and warmth to touch that extends toward left flank Central nervous system: Alert and oriented. No focal  neurological deficits. Extremities: Lower and upper extremity edema improving   Data Reviewed: I have personally reviewed following labs and imaging studies  CBC:  Recent Labs Lab 03/04/16 0959 03/06/16 0424 03/07/16 0344 03/08/16 0252 03/09/16 0504  WBC 11.7* 11.7* 8.8 6.8 8.3  HGB 13.0 9.0* 9.0* 9.1* 9.0*  HCT 41.0 29.3* 28.7* 28.7* 27.9*  MCV 72.1* 70.1* 68.2* 67.1* 66.4*  PLT 461* 375 336 312 [id number]   Basic Metabolic Panel:  Recent Labs Lab 03/03/16 2306 03/04/16 0959 03/06/16 0424 03/07/16 0344 03/08/16 0252 03/09/16 0504  NA 133* 135 134* 134* 133* 130*  K 4.6 4.6 3.9 2.9* 2.7* 2.8*  CL 104 107 108 105 103 99*  CO2 19* 18* 21* 23 23 23   GLUCOSE 125* 97 117* 97 95 71  BUN 8 12 8  <5* 7 7  CREATININE 0.77 0.66 0.43* 0.44 0.46 0.40*  CALCIUM 8.0* 7.6* 7.8* 7.8* 7.8* 7.8*  MG 1.2* 1.8  --   --   --  1.5*   Liver Function Tests:  Recent Labs Lab 03/03/16 0634  AST 15  ALT 15  ALKPHOS 65  BILITOT 0.7  PROT 5.5*  ALBUMIN 2.9*   CBG:  Recent Labs Lab 03/03/16 0748 03/07/16 0738 03/08/16 0722 03/09/16 0742 03/09/16 0852  GLUCAP 79 89 92 66 101*   Urine analysis:    Component Value Date/Time   COLORURINE AMBER* 03/06/2016 0157   APPEARANCEUR CLEAR 03/06/2016 0157   LABSPEC 1.019 03/06/2016 0157   PHURINE 5.5 03/06/2016 0157   GLUCOSEU 100* 03/06/2016 0157   HGBUR SMALL* 03/06/2016 0157   BILIRUBINUR NEGATIVE 03/06/2016 0157   KETONESUR NEGATIVE 03/06/2016 0157   PROTEINUR 30* 03/06/2016 0157   UROBILINOGEN 1.0 12/18/2012 1754   NITRITE NEGATIVE 03/06/2016 0157   LEUKOCYTESUR NEGATIVE 03/06/2016 0157   Recent Results (from the past 240 hour(s))  Surgical PCR screen     Status: None   Collection Time: 03/03/16  4:00 AM  Result Value Ref Range Status   MRSA, PCR NEGATIVE NEGATIVE Final   Staphylococcus aureus NEGATIVE NEGATIVE Final    Anti-infectives    Start     Dose/Rate Route Frequency Ordered Stop   03/03/16 0846  cefoTEtan in  Dextrose 5% (CEFOTAN) 2-2.08 GM-% IVPB    Comments:  Forte, Lindsi   : cabinet override      03/03/16 0846 03/03/16 2059   03/03/16 0600  cefoTEtan (CEFOTAN) 2 g in dextrose 5 % 50 mL IVPB    Comments:  In OR holding pre op   2 g 100 mL/hr over 30 Minutes Intravenous On call to O.R. 03/02/16 1054 03/03/16 1009   03/02/16 1000  cefoTEtan (CEFOTAN) 2 g in dextrose 5 % 50 mL IVPB  Status:  Discontinued    Comments:  In  OR holding pre op   2 g 100 mL/hr over 30 Minutes Intravenous Every 12 hours 03/02/16 0959 03/02/16 1054     Radiology Studies: No results found.  Scheduled Meds: . antiseptic oral rinse  7 mL Mouth Rinse q12n4p  . chlorhexidine  15 mL Mouth Rinse BID  . DULoxetine  60 mg Oral Daily  . enoxaparin (LOVENOX) injection  40 mg Subcutaneous Daily  . feeding supplement (ENSURE ENLIVE)  237 mL Oral TID BM  . methadone  10 mg Oral TID  . potassium chloride  40 mEq Oral TID  . sodium chloride flush  3 mL Intravenous Q12H  . sodium hypochlorite   Irrigation Daily  . tamsulosin  0.4 mg Oral Daily   Continuous Infusions:     LOS: 8 days    Time spent: 35 min  Faye Ramsay, DO [facility] Pager [phone]  If 7PM-7AM, please contact night-coverage [email or web address] Password [facility] 03/09/2016, 11:51 AM

## 2016-03-10 ENCOUNTER — Encounter (HOSPITAL_COMMUNITY): Payer: Self-pay | Admitting: Radiology

## 2016-03-10 ENCOUNTER — Inpatient Hospital Stay (HOSPITAL_COMMUNITY): Payer: BLUE CROSS/BLUE SHIELD

## 2016-03-10 DIAGNOSIS — L0390 Cellulitis, unspecified: Secondary | ICD-10-CM | POA: Diagnosis not present

## 2016-03-10 LAB — BASIC METABOLIC PANEL
Anion gap: 6 (ref 5–15)
BUN: 8 mg/dL (ref 6–20)
CALCIUM: 7.6 mg/dL — AB (ref 8.9–10.3)
CHLORIDE: 98 mmol/L — AB (ref 101–111)
CO2: 26 mmol/L (ref 22–32)
CREATININE: 0.38 mg/dL — AB (ref 0.44–1.00)
GFR calc non Af Amer: >60 mL/min (ref >60)
GLUCOSE: 99 mg/dL (ref 65–99)
Potassium: 3.7 mmol/L (ref 3.5–5.1)
Sodium: 130 mmol/L — ABNORMAL LOW (ref 135–145)

## 2016-03-10 LAB — CBC
HEMATOCRIT: 26.8 % — AB (ref 36.0–46.0)
Hemoglobin: 8.7 g/dL — ABNORMAL LOW (ref 12.0–15.0)
MCH: 21.3 pg — AB (ref 26.0–34.0)
MCHC: 32.5 g/dL (ref 30.0–36.0)
MCV: 65.5 fL — AB (ref 78.0–100.0)
PLATELETS: 324 10*3/uL (ref 150–400)
RBC: 4.09 MIL/uL (ref 3.87–5.11)
RDW: 16.1 % — AB (ref 11.5–15.5)
WBC: 11.4 10*3/uL — AB (ref 4.0–10.5)

## 2016-03-10 LAB — GLUCOSE, CAPILLARY: Glucose-Capillary: 95 mg/dL (ref 65–99)

## 2016-03-10 LAB — MAGNESIUM: Magnesium: 1.8 mg/dL (ref 1.7–2.4)

## 2016-03-10 MED ORDER — CLINDAMYCIN PHOSPHATE 600 MG/50ML IV SOLN
600.0000 mg | Freq: Three times a day (TID) | INTRAVENOUS | Status: DC
  Administered 2016-03-10 – 2016-03-11 (×3): 600 mg via INTRAVENOUS
  Filled 2016-03-10 (×4): qty 50

## 2016-03-10 MED ORDER — DEXTROSE 5 % IV SOLN: 1.0000 g | INTRAVENOUS | Status: DC

## 2016-03-10 MED ORDER — IOPAMIDOL (ISOVUE-300) INJECTION 61%
INTRAVENOUS | Status: AC
  Administered 2016-03-10: 100 mL
  Filled 2016-03-10: qty 100

## 2016-03-10 MED ORDER — DIATRIZOATE MEGLUMINE & SODIUM 66-10 % PO SOLN
ORAL | Status: AC
  Administered 2016-03-10: 18:00:00
  Filled 2016-03-10: qty 30

## 2016-03-10 NOTE — Consult Note (Addendum)
WOC follow-up:  Surgical team following for assessment and plan of care for abd wound. Ostomy pouch intact with good seal.  Small amt tan drainage in the pouch; appearance consistent with possible pus.  No stool or flatus, previously noted erythremia in Surgical team progress notes to left outer abd has been marked.  Pt is preparing to go to CT scan this afternoon.  Plan to change pouch and assess peristomal area tomorrow. Julien Girt MSN, RN, [place], [place], [place].

## 2016-03-10 NOTE — Progress Notes (Signed)
Patient ID: Carolyn Barton, female   DOB: 01-Mar-1956, 60 y.o.   MRN: [id number]     [facility]      [address] [place], [place] [address]    Phone: [phone] FAX: [phone]     Subjective: Ate more yesterday and this AM. Walked this AM. WBC up to 11.4k. Tachy. bp stable.  Afebrile.   Objective:  Vital signs:  Filed Vitals:   03/09/16 2355 03/10/16 0415 03/10/16 0650 03/10/16 0725  BP: 116/75 110/88  109/72  Pulse: 120 117 113 116  Temp: 99.1 F (37.3 C) 98.5 F (36.9 C)  98 F (36.7 C)  TempSrc: Oral Oral  Oral  Resp: '13 27 11 20  '$ Height:      Weight:   69.5 kg (153 lb 3.5 oz)   SpO2: 95% 95% 96% 98%    Last BM Date: 03/08/16  Intake/Output   Yesterday:  06/11 0701 - 06/12 0700 In: 840 [P.O.:840] Out: 1657 [Urine:2100; XUXYB:3383] This shift:  Total I/O In: -  Out: 600 [Urine:250; Stool:350]   Physical Exam: General: Pt awake/alert/oriented x4 in no acute distress Abdomen: Soft. Nondistended. Tender around incision. Midline wound with intact fascia, patches of necrosis. No evidence of peritonitis. No incarcerated hernias.  Left abdominal wall erythema, no areas of fluctuance.  llq ostomy is viable, stool and air present.     Problem List:   Principal Problem:   Mass of colon Active Problems:   Essential thrombocytosis (HCC)   Scoliosis of lumbar spine   Lumbar radiculopathy   Bowel obstruction (HCC)   Depression   Colonic mass   Nausea & vomiting   Abdominal pain   Malnutrition of moderate degree    Results:   Labs: Results for orders placed or performed during the hospital encounter of 02/29/16 (from the past 48 hour(s))  CBC     Status: Abnormal   Collection Time: 03/09/16  5:04 AM  Result Value Ref Range   WBC 8.3 4.0 - 10.5 K/uL   RBC 4.20 3.87 - 5.11 MIL/uL   Hemoglobin 9.0 (L) 12.0 - 15.0 g/dL   HCT 27.9 (L) 36.0 - 46.0 %   MCV 66.4 (L) 78.0 - 100.0 fL   MCH 21.4 (L)  26.0 - 34.0 pg   MCHC 32.3 30.0 - 36.0 g/dL   RDW 16.2 (H) 11.5 - 15.5 %   Platelets 320 150 - 400 K/uL  Basic metabolic panel     Status: Abnormal   Collection Time: 03/09/16  5:04 AM  Result Value Ref Range   Sodium 130 (L) 135 - 145 mmol/L   Potassium 2.8 (L) 3.5 - 5.1 mmol/L   Chloride 99 (L) 101 - 111 mmol/L   CO2 23 22 - 32 mmol/L   Glucose, Bld 71 65 - 99 mg/dL   BUN 7 6 - 20 mg/dL   Creatinine, Ser 0.40 (L) 0.44 - 1.00 mg/dL   Calcium 7.8 (L) 8.9 - 10.3 mg/dL   GFR calc non Af Amer >60 >60 mL/min   GFR calc Af Amer >60 >60 mL/min    Comment: (NOTE) The eGFR has been calculated using the CKD EPI equation. This calculation has not been validated in all clinical situations. eGFR's persistently <60 mL/min signify possible Chronic Kidney Disease.    Anion gap 8 5 - 15  Magnesium     Status: Abnormal   Collection Time: 03/09/16  5:04 AM  Result Value Ref Range  Magnesium 1.5 (L) 1.7 - 2.4 mg/dL  Glucose, capillary     Status: None   Collection Time: 03/09/16  7:42 AM  Result Value Ref Range   Glucose-Capillary 66 65 - 99 mg/dL   Comment 1 Notify RN   Glucose, capillary     Status: Abnormal   Collection Time: 03/09/16  8:52 AM  Result Value Ref Range   Glucose-Capillary 101 (H) 65 - 99 mg/dL   Comment 1 Notify RN   Basic metabolic panel     Status: Abnormal   Collection Time: 03/10/16  4:06 AM  Result Value Ref Range   Sodium 130 (L) 135 - 145 mmol/L   Potassium 3.7 3.5 - 5.1 mmol/L    Comment: DELTA CHECK NOTED   Chloride 98 (L) 101 - 111 mmol/L   CO2 26 22 - 32 mmol/L   Glucose, Bld 99 65 - 99 mg/dL   BUN 8 6 - 20 mg/dL   Creatinine, Ser 0.38 (L) 0.44 - 1.00 mg/dL   Calcium 7.6 (L) 8.9 - 10.3 mg/dL   GFR calc non Af Amer >60 >60 mL/min   GFR calc Af Amer >60 >60 mL/min    Comment: (NOTE) The eGFR has been calculated using the CKD EPI equation. This calculation has not been validated in all clinical situations. eGFR's persistently <60 mL/min signify possible  Chronic Kidney Disease.    Anion gap 6 5 - 15  Magnesium     Status: None   Collection Time: 03/10/16  4:06 AM  Result Value Ref Range   Magnesium 1.8 1.7 - 2.4 mg/dL  CBC     Status: Abnormal   Collection Time: 03/10/16  4:06 AM  Result Value Ref Range   WBC 11.4 (H) 4.0 - 10.5 K/uL   RBC 4.09 3.87 - 5.11 MIL/uL   Hemoglobin 8.7 (L) 12.0 - 15.0 g/dL   HCT 26.8 (L) 36.0 - 46.0 %   MCV 65.5 (L) 78.0 - 100.0 fL   MCH 21.3 (L) 26.0 - 34.0 pg   MCHC 32.5 30.0 - 36.0 g/dL   RDW 16.1 (H) 11.5 - 15.5 %   Platelets 324 150 - 400 K/uL  Glucose, capillary     Status: None   Collection Time: 03/10/16  7:22 AM  Result Value Ref Range   Glucose-Capillary 95 65 - 99 mg/dL   Comment 1 Notify RN     Imaging / Studies: No results found.  Medications / Allergies:  Scheduled Meds: . antiseptic oral rinse  7 mL Mouth Rinse q12n4p  . chlorhexidine  15 mL Mouth Rinse BID  . clindamycin (CLEOCIN) IV  600 mg Intravenous Q8H  . DULoxetine  60 mg Oral Daily  . enoxaparin (LOVENOX) injection  40 mg Subcutaneous Daily  . feeding supplement (ENSURE ENLIVE)  237 mL Oral TID BM  . methadone  10 mg Oral TID  . potassium chloride  40 mEq Oral TID  . sodium chloride flush  3 mL Intravenous Q12H  . tamsulosin  0.4 mg Oral Daily   Continuous Infusions:  PRN Meds:.acetaminophen **OR** acetaminophen, HYDROmorphone (DILAUDID) injection, LORazepam, methocarbamol (ROBAXIN)  IV, metoprolol  Antibiotics: Anti-infectives    Start     Dose/Rate Route Frequency Ordered Stop   03/10/16 1400  clindamycin (CLEOCIN) IVPB 600 mg     600 mg 100 mL/hr over 30 Minutes Intravenous Every 8 hours 03/10/16 1104     03/03/16 0846  cefoTEtan in Dextrose 5% (CEFOTAN) 2-2.08 GM-% IVPB    Comments:  Forte, Lindsi   : cabinet override      03/03/16 0846 03/03/16 2059   03/03/16 0600  cefoTEtan (CEFOTAN) 2 g in dextrose 5 % 50 mL IVPB    Comments:  In OR holding pre op   2 g 100 mL/hr over 30 Minutes Intravenous On call to  O.R. 03/02/16 1054 03/03/16 1009   03/02/16 1000  cefoTEtan (CEFOTAN) 2 g in dextrose 5 % 50 mL IVPB  Status:  Discontinued    Comments:  In OR holding pre op   2 g 100 mL/hr over 30 Minutes Intravenous Every 12 hours 03/02/16 0959 03/02/16 1054         Assessment/Plan Metastatic adenocarcinoma of the colon(Dr. Benay Spice)  POD#7 exploratory laparotomy, descending colostomy, mucus fistula, repair of chronic serosal tears---Dr. Brantley Stage 03/03/16 -regular diet, encouraged PO intake.ate a bit more yesterday and this AM.  Continue regular diet +shakes.  -Continue with methadone, PRN dilaudid -mobilize, IS -suspect pseudomonas to wound, Dakin's D#2/3 Left abdominal wall cellulitis-start clindamycin Urinary retention-urology following. Foley replaced 6/8, on flomax.voiding trial Hypotension/tachycardia-BP stable, still tachy, control pain. PRN metoprolol per primary team.  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 see above FEN-hypokalemia and hypomagnesemia-resolved  VTE prophylaxis-scd, lovenox Dispo-SDU   Erby Pian, ANP-BC [facility] Pager [phone](7A-4:30P) For consults and floor pages call [phone](7A-4:30P)  03/10/2016 11:07 AM

## 2016-03-10 NOTE — Progress Notes (Signed)
PT Cancellation Note  Patient Details Name: Carolyn Barton MRN: [id number] DOB: 10/26/1955   Cancelled Treatment:    Reason Eval/Treat Not Completed: Patient declined, no reason specified, stated she just walked with nsg.   Duncan Dull 03/10/2016, 12:12 PM Alben Deeds, [place] DPT  [phone]

## 2016-03-10 NOTE — Progress Notes (Addendum)
PROGRESS NOTE    Carolyn Barton  [id number] DOB: [birth year] DOA: 02/29/2016   PCP: Marton Redwood, MD   Brief Narrative:  60 y.o. female with chronic back pain, thrombocytosis, depression, tachycardia, who presented with nausea, vomiting, abdominal pain and constipation. Imaging studies notable for colonic mass in distal sigmoid colon, measuring 6.6 x 3.2 x 3.8 cm, resulting in partial obstruction, compatible with primary colonic malignancy, small adjacent nodules, concerning for local spread of disease. Pt is now S/p resection on 6/5.  Assessment & Plan:   Colonic mass - Obstructing large bowel mass, likely adenocarcinoma with metastasis to liver  - POD# 7 exploratory laparotomy, descending colostomy, mucus fistula by Dr. Brantley Stage 03/03/16 - diet advanced to regular and pt so far tolerating well  - provide analgesia as needed  - encourage OOB to chair and ambulating with assistance  - appreciate surgery team following   SIRS secondary to Cellulitis - erythema and TTP extending from the outside the wounds site at the umbilical level and towards the left flank area - looks more red this AM and WBC up from 8 --> 11 K with Tmax 100.1 F and still tachy - I have outlined the borders of the erythmea - asked surgery team to look at this as well  - will place on Clindamycin for now, blood cultures per protocol for cellulitis   Hypotension/tachycardia - appears to be reactive and I suspect that extra volume also contributing to tachycardia as well as now cellulitis as outlined above  - will add Clindamycin for now and will obtain blood cultures per protocol  - gave one dose of Lasix 20 mg IV 6/10 and pt's weight is down from 162 --> 159 lbs --> 153 lbs this AM - will hold off on Lasix for now and see how pt does on her own, perhaps she will continue to diurese without lasix  - keep in SDU for now   Pleural effusions, anasarca - improving, weight trending down as outlined above  - hold  Lasix today  - continue to monitor daily weights, strict I/O  Leukocytosis - from leukopenia to leukocytosis in 24 hours: 2.8 --> 11.7 --> 8.3 --> 11 K - suspect SIRS due to cellulitis as outlined above - started Clindamycin, follow up on blood cultures   Moderate protein calorie malnutrition  - nutritionist consulted - pt tolerating diet well    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 - resumed methadone  - pain controlled for now  Thrombocytosis, essential  - resolved  - CBC in AM  Hypokalemia and hypomagnesemia - supplemented and both electrolytes at target range  - BMP and Mg level in AM  N/V - resolved  - allow zofran as needed   Hyponatremia - mild, monitor  - BMP in AM  Urinary retention - keep foley today, trial of voiding in AM  DVT prophylaxis:  SCD's  Code Status: Full Code  Family Communication: Husband at bedside, his questions were answered   Disposition Plan:  Home once cleared by surgery team   Consultants:   Surgery   Oncology   Procedures:   Colonic resection  Subjective: Fast HR still this AM. Still with TTP at the left abd area.   Objective: Filed Vitals:   03/09/16 2355 03/10/16 0415 03/10/16 0650 03/10/16 0725  BP: 116/75 110/88  109/72  Pulse: 120 117 113 116  Temp: 99.1 F (37.3 C) 98.5 F (36.9 C)  98 F (36.7 C)  TempSrc: Oral Oral  Oral  Resp: 13 27 11 20   Height:      Weight:   69.5 kg (153 lb 3.5 oz)   SpO2: 95% 95% 96% 98%    Intake/Output Summary (Last 24 hours) at 03/10/16 1135 Last data filed at 03/10/16 1058  Gross per 24 hour  Intake    600 ml  Output   3800 ml  Net  -3200 ml   Filed Weights   03/08/16 1400 03/09/16 1221 03/10/16 0650  Weight: 72.576 kg (160 lb) 70.7 kg (155 lb 13.8 oz) 69.5 kg (153 lb 3.5 oz)    Examination:  General exam: more awake and interactive today Respiratory system: mild tachypnea with diminished breath sounds at bases and crackles  Cardiovascular system: S1 & S2 heard,  tachy-sinus Gastrointestinal system: non tender, open site looks ok, bit dry, at the umbilical level and outside the wounds border there is an area of erythema and TTP and warmth to touch that extends toward left flank, more red this am compared to yesterday  Central nervous system: Alert and oriented. No focal neurological deficits. Extremities: Lower and upper extremity edema improving   Data Reviewed: I have personally reviewed following labs and imaging studies  CBC:  Recent Labs Lab 03/06/16 0424 03/07/16 0344 03/08/16 0252 03/09/16 0504 03/10/16 0406  WBC 11.7* 8.8 6.8 8.3 11.4*  HGB 9.0* 9.0* 9.1* 9.0* 8.7*  HCT 29.3* 28.7* 28.7* 27.9* 26.8*  MCV 70.1* 68.2* 67.1* 66.4* 65.5*  PLT 375 336 312 320 0000000   Basic Metabolic Panel:  Recent Labs Lab 03/03/16 2306 03/04/16 0959 03/06/16 0424 03/07/16 0344 03/08/16 0252 03/09/16 0504 03/10/16 0406  NA 133* 135 134* 134* 133* 130* 130*  K 4.6 4.6 3.9 2.9* 2.7* 2.8* 3.7  CL 104 107 108 105 103 99* 98*  CO2 19* 18* 21* 23 23 23 26   GLUCOSE 125* 97 117* 97 95 71 99  BUN 8 12 8  <5* 7 7 8   CREATININE 0.77 0.66 0.43* 0.44 0.46 0.40* 0.38*  CALCIUM 8.0* 7.6* 7.8* 7.8* 7.8* 7.8* 7.6*  MG 1.2* 1.8  --   --   --  1.5* 1.8   CBG:  Recent Labs Lab 03/07/16 0738 03/08/16 0722 03/09/16 0742 03/09/16 0852 03/10/16 0722  GLUCAP 89 92 66 101* 95   Urine analysis:    Component Value Date/Time   COLORURINE AMBER* 03/06/2016 0157   APPEARANCEUR CLEAR 03/06/2016 0157   LABSPEC 1.019 03/06/2016 0157   PHURINE 5.5 03/06/2016 0157   GLUCOSEU 100* 03/06/2016 0157   HGBUR SMALL* 03/06/2016 0157   BILIRUBINUR NEGATIVE 03/06/2016 0157   KETONESUR NEGATIVE 03/06/2016 0157   PROTEINUR 30* 03/06/2016 0157   UROBILINOGEN 1.0 12/18/2012 1754   NITRITE NEGATIVE 03/06/2016 0157   LEUKOCYTESUR NEGATIVE 03/06/2016 0157   Recent Results (from the past 240 hour(s))  Surgical PCR screen     Status: None   Collection Time: 03/03/16  4:00  AM  Result Value Ref Range Status   MRSA, PCR NEGATIVE NEGATIVE Final   Staphylococcus aureus NEGATIVE NEGATIVE Final    Anti-infectives    Start     Dose/Rate Route Frequency Ordered Stop   03/10/16 1400  clindamycin (CLEOCIN) IVPB 600 mg     600 mg 100 mL/hr over 30 Minutes Intravenous Every 8 hours 03/10/16 1104     03/03/16 0846  cefoTEtan in Dextrose 5% (CEFOTAN) 2-2.08 GM-% IVPB    Comments:  Forte, Lindsi   : cabinet override      03/03/16 0846 03/03/16 2059  03/03/16 0600  cefoTEtan (CEFOTAN) 2 g in dextrose 5 % 50 mL IVPB    Comments:  In OR holding pre op   2 g 100 mL/hr over 30 Minutes Intravenous On call to O.R. 03/02/16 1054 03/03/16 1009   03/02/16 1000  cefoTEtan (CEFOTAN) 2 g in dextrose 5 % 50 mL IVPB  Status:  Discontinued    Comments:  In OR holding pre op   2 g 100 mL/hr over 30 Minutes Intravenous Every 12 hours 03/02/16 0959 03/02/16 1054     Radiology Studies: No results found.  Scheduled Meds: . antiseptic oral rinse  7 mL Mouth Rinse q12n4p  . chlorhexidine  15 mL Mouth Rinse BID  . clindamycin (CLEOCIN) IV  600 mg Intravenous Q8H  . DULoxetine  60 mg Oral Daily  . enoxaparin (LOVENOX) injection  40 mg Subcutaneous Daily  . feeding supplement (ENSURE ENLIVE)  237 mL Oral TID BM  . methadone  10 mg Oral TID  . potassium chloride  40 mEq Oral TID  . sodium chloride flush  3 mL Intravenous Q12H  . tamsulosin  0.4 mg Oral Daily   Continuous Infusions:     LOS: 9 days    Time spent: 35 min  MAGICK-MYERS, ISKRA, DO [facility] Pager [phone]  If 7PM-7AM, please contact night-coverage [email or web address] Password [facility] 03/10/2016, 11:35 AM

## 2016-03-10 NOTE — Care Management Note (Signed)
Case Management Note  Patient Details  Name: Carolyn Barton MRN: [id number] Date of Birth: 06-03-1956  Subjective/Objective:  Colonic mass, Exploratory laparotomy, descending colostomy, SIRS                 Action/Plan: Discharge Planning: Adonis Housekeeper MD  NCM spoke to pt and brother at bedside. States her husband will be at home to assist with care at home. Will need RW and 3n1 bedside commode. States she believes she can get from a family member. Offered choice/HH list provided. Pt requested [facility] for HH. Contacted [facility] Liaison for [facility]. Will need HH RN/PT orders. Will continue to follow for dc needs. Can afford medications at home.   Expected Discharge Date:               Expected Discharge Plan:  [place]  In-House Referral:  NA  Discharge planning Services  CM Consult  Post Acute Care Choice:  Durable Medical Equipment, Home Health (New colostomy) Choice offered to:  Patient  DME Arranged:    DME Agency:     HH Arranged:  RN, PT [place] Agency:  [facility] [place]  Status of Service:  In process, will continue to follow  Medicare Important Message Given:    Date Medicare IM Given:    Medicare IM give by:    Date Additional Medicare IM Given:    Additional Medicare Important Message give by:     If discussed at [place] of Stay Meetings, dates discussed:    Additional Comments:  Erenest Rasher, RN 03/10/2016, 11:56 AM

## 2016-03-11 DIAGNOSIS — A499 Bacterial infection, unspecified: Secondary | ICD-10-CM

## 2016-03-11 DIAGNOSIS — R6510 Systemic inflammatory response syndrome (SIRS) of non-infectious origin without acute organ dysfunction: Secondary | ICD-10-CM

## 2016-03-11 LAB — BLOOD CULTURE ID PANEL (REFLEXED)
ACINETOBACTER BAUMANNII: NOT DETECTED
CANDIDA PARAPSILOSIS: NOT DETECTED
CARBAPENEM RESISTANCE: NOT DETECTED
Candida albicans: NOT DETECTED
Candida glabrata: NOT DETECTED
Candida krusei: NOT DETECTED
Candida tropicalis: NOT DETECTED
ENTEROBACTER CLOACAE COMPLEX: NOT DETECTED
ENTEROBACTERIACEAE SPECIES: NOT DETECTED
ESCHERICHIA COLI: NOT DETECTED
Enterococcus species: NOT DETECTED
Haemophilus influenzae: NOT DETECTED
KLEBSIELLA OXYTOCA: NOT DETECTED
Klebsiella pneumoniae: NOT DETECTED
LISTERIA MONOCYTOGENES: NOT DETECTED
Methicillin resistance: DETECTED — AB
Neisseria meningitidis: NOT DETECTED
PSEUDOMONAS AERUGINOSA: NOT DETECTED
Proteus species: NOT DETECTED
STAPHYLOCOCCUS AUREUS BCID: NOT DETECTED
STAPHYLOCOCCUS SPECIES: DETECTED — AB
STREPTOCOCCUS AGALACTIAE: NOT DETECTED
Serratia marcescens: NOT DETECTED
Streptococcus pneumoniae: NOT DETECTED
Streptococcus pyogenes: NOT DETECTED
Streptococcus species: NOT DETECTED
Vancomycin resistance: NOT DETECTED

## 2016-03-11 LAB — CBC
HEMATOCRIT: 23.5 % — AB (ref 36.0–46.0)
HEMOGLOBIN: 7.7 g/dL — AB (ref 12.0–15.0)
MCH: 21.5 pg — AB (ref 26.0–34.0)
MCHC: 32.8 g/dL (ref 30.0–36.0)
MCV: 65.6 fL — AB (ref 78.0–100.0)
Platelets: 354 10*3/uL (ref 150–400)
RBC: 3.58 MIL/uL — AB (ref 3.87–5.11)
RDW: 16.1 % — ABNORMAL HIGH (ref 11.5–15.5)
WBC: 14.3 10*3/uL — ABNORMAL HIGH (ref 4.0–10.5)

## 2016-03-11 LAB — BASIC METABOLIC PANEL
Anion gap: 5 (ref 5–15)
BUN: 5 mg/dL — AB (ref 6–20)
CHLORIDE: 98 mmol/L — AB (ref 101–111)
CO2: 24 mmol/L (ref 22–32)
CREATININE: 0.39 mg/dL — AB (ref 0.44–1.00)
Calcium: 7.7 mg/dL — ABNORMAL LOW (ref 8.9–10.3)
GFR calc Af Amer: >60 mL/min (ref >60)
GFR calc non Af Amer: >60 mL/min (ref >60)
GLUCOSE: 79 mg/dL (ref 65–99)
POTASSIUM: 4.7 mmol/L (ref 3.5–5.1)
SODIUM: 127 mmol/L — AB (ref 135–145)

## 2016-03-11 LAB — MAGNESIUM: Magnesium: 1.8 mg/dL (ref 1.7–2.4)

## 2016-03-11 LAB — GLUCOSE, CAPILLARY: Glucose-Capillary: 93 mg/dL (ref 65–99)

## 2016-03-11 MED ORDER — VANCOMYCIN HCL IN DEXTROSE 1-5 GM/200ML-% IV SOLN
1000.0000 mg | Freq: Two times a day (BID) | INTRAVENOUS | Status: DC
  Administered 2016-03-12 – 2016-03-15 (×7): 1000 mg via INTRAVENOUS
  Filled 2016-03-11 (×7): qty 200

## 2016-03-11 MED ORDER — OXYCODONE HCL 5 MG PO TABS
10.0000 mg | ORAL_TABLET | ORAL | Status: DC | PRN
  Administered 2016-03-13 – 2016-03-15 (×5): 10 mg via ORAL
  Filled 2016-03-11 (×6): qty 2

## 2016-03-11 MED ORDER — VANCOMYCIN HCL 10 G IV SOLR
1500.0000 mg | Freq: Once | INTRAVENOUS | Status: AC
  Administered 2016-03-11: 1500 mg via INTRAVENOUS
  Filled 2016-03-11: qty 1500

## 2016-03-11 MED ORDER — TRAMADOL HCL 50 MG PO TABS: 50.0000 mg | ORAL_TABLET | Freq: Four times a day (QID) | ORAL | Status: DC | PRN

## 2016-03-11 MED ORDER — IMIPENEM-CILASTATIN 500 MG IV SOLR
500.0000 mg | Freq: Four times a day (QID) | INTRAVENOUS | Status: DC
  Administered 2016-03-11 – 2016-03-14 (×13): 500 mg via INTRAVENOUS
  Filled 2016-03-11 (×15): qty 500

## 2016-03-11 NOTE — Progress Notes (Addendum)
Pharmacy Antibiotic Note  Carolyn Barton is a 60 y.o. female admitted on 02/29/2016 with nausea, vomiting, abdominal pain, and constipation.  Pharmacy has been consulted for vancomycin dosing.   Called by [facility] lab and patient now has 2/2 positive blood cultures with staphylococcus epidermidis - methicillin resistant (MRSE). She has been receiving imipenem for abdominal wall cellulitis. Spoke with Dr. Garwin Brothers and will start patient on vancomycin  Plan: - Vancomycin 1500 mg IV load x 1 - Then vancomycin 1,000 mg IV q12h - VT goal 15-20 - VT at steady state - Monitor repeat BCx, CBC, renal fx, clinical course  Height: 5\' 5"  (165.1 cm) Weight: 159 lb 2.8 oz (72.2 kg) IBW/kg (Calculated) : 57  Temp (24hrs), Avg:98.3 F (36.8 C), Min:98 F (36.7 C), Max:98.6 F (37 C)   Recent Labs Lab 03/06/16 0500 03/07/16 0344 03/08/16 0252 03/09/16 0504 03/10/16 0406 03/11/16 0241  WBC  --  8.8 6.8 8.3 11.4* 14.3*  CREATININE  --  0.44 0.46 0.40* 0.38* 0.39*  LATICACIDVEN 1.8  --   --   --   --   --     Estimated Creatinine Clearance: 75.4 mL/min (by C-G formula based on Cr of 0.39).    No Known Allergies  Antimicrobials this admission: Imipenem 6/13 >> Vancomycin 6/13 >>  Dose adjustments this admission: None  Microbiology results: 6/12 BCx: MRSE (2/2) 6/8 UCx: negative  6/5 MRSA PCR: negative  Thank you for allowing pharmacy to be a part of this patient's care.  Cassie L. Nicole Kindred, PharmD PGY2 Infectious Diseases Pharmacy Resident Pager: [phone] 03/11/2016 3:17 PM

## 2016-03-11 NOTE — Progress Notes (Signed)
[facility] Progress Note 8 Days Post-Op  Subjective: Patient eating better and feeling better.  Hates the potassium.  Voiding without the catheter.  Objective: Vital signs in last 24 hours: Temp:  [98 F (36.7 C)-98.7 F (37.1 C)] 98.2 F (36.8 C) (06/13 0748) Pulse Rate:  [108-125] 108 (06/13 0409) Resp:  [16-36] 19 (06/13 0409) BP: (106-116)/(63-68) 111/63 mmHg (06/13 0409) SpO2:  [95 %-100 %] 99 % (06/13 0409) Weight:  [69.6 kg (153 lb 7 oz)-72.2 kg (159 lb 2.8 oz)] 72.2 kg (159 lb 2.8 oz) (06/13 0500) Last BM Date: 03/08/16  Intake/Output from previous day: 06/12 0701 - 06/13 0700 In: 50 [IV Piggyback:50] Out: 1700 [Urine:750; Stool:950] Intake/Output this shift:    General: No distress.  Lungs: clear  Abd: Area around the stoma is receding.  Much less tender.  CT shows subcutaneous air in the skin on that side., no abscess or fluid collection.  Extremities: No changes  Neuro: Intact  Lab Results:  @LABLAST2 (wbc:2,hgb:2,hct:2,plt:2) BMET ) Recent Labs  03/10/16 0406 03/11/16 0241  NA 130* 127*  K 3.7 4.7  CL 98* 98*  CO2 26 24  GLUCOSE 99 79  BUN 8 5*  CREATININE 0.38* 0.39*  CALCIUM 7.6* 7.7*   PT/INR No results for input(s): LABPROT, INR in the last 72 hours. ABG No results for input(s): PHART, HCO3 in the last 72 hours.  Invalid input(s): PCO2, PO2  Studies/Results: Ct Abdomen Pelvis W Contrast  03/10/2016  CLINICAL DATA:  Pt with follow-up on blister, infected, abdominal wall, mass of colon. EXAM: CT ABDOMEN AND PELVIS WITH CONTRAST TECHNIQUE: Multidetector CT imaging of the abdomen and pelvis was performed using the standard protocol following bolus administration of intravenous contrast. CONTRAST:  100 ml ISOVUE-300 IOPAMIDOL (ISOVUE-300) INJECTION 61% COMPARISON:  02/29/2016 FINDINGS: Since the prior exam, patient has undergone surgery with formation of a left lower quadrant colostomy decompressing the bowel obstruction caused by the apparent  distal colonic mass. Subcutaneous air surrounds the colostomy. The skin and subcutaneous soft tissues along the anterior abdominal wall incision remains open. The fascia is closed. There are air-fluid levels within the stomach, small bowel and colon consistent with a postoperative adynamic ileus. There is no significant bowel distention. Diffuse edema is seen throughout subcutaneous soft tissues, most evident along the flanks, new since the prior exam. There is no defined fluid collection within the abdomen or the abdominal wall suggest an abscess. Lung bases: Small effusions, right greater than left, with associated lung base atelectasis. Stable hiatal hernia. Hepatobiliary: Subtle liver lesions noted on the prior study are not resolved on this unenhanced scan. Liver unremarkable. Gallbladder is unremarkable. No bile duct dilation. Spleen, pancreas, adrenal glands:  Unremarkable. Kidneys, ureters, bladder: No renal mass or stone. No hydronephrosis. Ureters not well-defined. Bladder with nondependent air likely from recent instrumentation. Peritoneal cavity: Small amount ascites. Minimal free air likely postoperative in origin. Lymph nodes: No definitive enlarged lymph nodes. Small nodules in the pelvis noted on the prior study are less well-defined. Musculoskeletal: Stable postsurgical changes from L5-S1 fusion. No osteoblastic or osteolytic lesions. IMPRESSION: 1. Since the prior exam, the patient has undergone abdominal surgery with formation of a left lower quadrant colostomy. There is subcutaneous air surrounding the colostomy. There is no significant residual bowel obstruction. Air-fluid levels within the bowel is consistent with a postoperative adynamic ileus. There is also diffuse subcutaneous edema which is new. The skin and subcutaneous soft tissues of the anterior abdominal wall incision are open. The fascia is closed. 2.  There is a small amount of ascites, which has increased from the prior exam. 3.  Small pleural effusions with lung base atelectasis is new from the prior study. 4. There is no defined fluid collection to suggest an abscess. Electronically Signed   By: Lajean Manes M.D.   On: 03/10/2016 21:00    Anti-infectives: Anti-infectives    Start     Dose/Rate Route Frequency Ordered Stop   03/11/16 0900  imipenem-cilastatin (PRIMAXIN) 500 mg in sodium chloride 0.9 % 100 mL IVPB     500 mg 200 mL/hr over 30 Minutes Intravenous Every 6 hours 03/11/16 0738     03/10/16 1400  clindamycin (CLEOCIN) IVPB 600 mg  Status:  Discontinued     600 mg 100 mL/hr over 30 Minutes Intravenous Every 8 hours 03/10/16 1104 03/11/16 0738   03/10/16 1145  cefTRIAXone (ROCEPHIN) 1 g in dextrose 5 % 50 mL IVPB  Status:  Discontinued     1 g 100 mL/hr over 30 Minutes Intravenous Every 24 hours 03/10/16 1138 03/10/16 1144   03/03/16 0846  cefoTEtan in Dextrose 5% (CEFOTAN) 2-2.08 GM-% IVPB    Comments:  Forte, Lindsi   : cabinet override      03/03/16 0846 03/03/16 2059   03/03/16 0600  cefoTEtan (CEFOTAN) 2 g in dextrose 5 % 50 mL IVPB    Comments:  In OR holding pre op   2 g 100 mL/hr over 30 Minutes Intravenous On call to O.R. 03/02/16 1054 03/03/16 1009   03/02/16 1000  cefoTEtan (CEFOTAN) 2 g in dextrose 5 % 50 mL IVPB  Status:  Discontinued    Comments:  In OR holding pre op   2 g 100 mL/hr over 30 Minutes Intravenous Every 12 hours 03/02/16 0959 03/02/16 1054      Assessment/Plan: s/p Procedure(s): EXPLORATORY LAPAROTOMY BIOPSY OF PERITONEAL NODULE DIVERTING SIGMOID COLOSTOMY Advance diet Transfer to the floor.  LOS: 10 days   Kathryne Eriksson. Dahlia Bailiff, MD, FACS [phone] [facility] Surgery 03/11/2016

## 2016-03-11 NOTE — Progress Notes (Signed)
PHARMACY - PHYSICIAN COMMUNICATION CRITICAL VALUE ALERT - BLOOD CULTURE IDENTIFICATION (BCID)  Results for orders placed or performed during the hospital encounter of 02/29/16  Blood Culture ID Panel (Reflexed) (Collected: 03/10/2016  2:00 PM)  Result Value Ref Range   Enterococcus species NOT DETECTED NOT DETECTED   Vancomycin resistance NOT DETECTED NOT DETECTED   Listeria monocytogenes NOT DETECTED NOT DETECTED   Staphylococcus species DETECTED (A) NOT DETECTED   Staphylococcus aureus NOT DETECTED NOT DETECTED   Methicillin resistance DETECTED (A) NOT DETECTED   Streptococcus species NOT DETECTED NOT DETECTED   Streptococcus agalactiae NOT DETECTED NOT DETECTED   Streptococcus pneumoniae NOT DETECTED NOT DETECTED   Streptococcus pyogenes NOT DETECTED NOT DETECTED   Acinetobacter baumannii NOT DETECTED NOT DETECTED   Enterobacteriaceae species NOT DETECTED NOT DETECTED   Enterobacter cloacae complex NOT DETECTED NOT DETECTED   Escherichia coli NOT DETECTED NOT DETECTED   Klebsiella oxytoca NOT DETECTED NOT DETECTED   Klebsiella pneumoniae NOT DETECTED NOT DETECTED   Proteus species NOT DETECTED NOT DETECTED   Serratia marcescens NOT DETECTED NOT DETECTED   Carbapenem resistance NOT DETECTED NOT DETECTED   Haemophilus influenzae NOT DETECTED NOT DETECTED   Neisseria meningitidis NOT DETECTED NOT DETECTED   Pseudomonas aeruginosa NOT DETECTED NOT DETECTED   Candida albicans NOT DETECTED NOT DETECTED   Candida glabrata NOT DETECTED NOT DETECTED   Candida krusei NOT DETECTED NOT DETECTED   Candida parapsilosis NOT DETECTED NOT DETECTED   Candida tropicalis NOT DETECTED NOT DETECTED    Name of physician (or Provider) Contacted: Dr. Doyle Askew  Changes to prescribed antibiotics required: One of two sets of blood cultures is positive for coagulase negative staph, methicillin resistant.  This probably represents contamination.  No orders received.  Patient continues on imipenem for abdominal  wall cellulitis.  Carolyn Barton 03/11/2016  11:28 AM

## 2016-03-11 NOTE — Progress Notes (Signed)
WOC wound consult note Reason for Consult: Follow up for ostomy teaching and monitoring Wound type: Colostomy  Measurement: At the 3 - 4 o'clock peristomal site, there is a site of mucocutaneous separation measuring 1.5 cm x 3 cm  X 2.5 cm.  No tunneling identified.  The wound bed is 100% yellow slough.  No observable drainage from area.  Periwound: Intact skin.  Patient's spouse performed the entire pouch removal, cleansing, and pouch replacement process with very minimal assistance.    Dressing procedure/placement/frequency: A section of Aquacel Ag+ hydrofiber cut and lightly packed into separation area.  A barrier ring was stretched and shaped to closely fit around the stoma, then the pouch wafer was placed over the area and the pouch was snapped in place.  Dr. Garwin Brothers was updated on findings and treatment approach.     Discussed POC with patient and bedside nurse.   Thanks Val Riles MSN, RN, CNS-BC, [employer]

## 2016-03-11 NOTE — Progress Notes (Signed)
Received patient from 3S, noted IV site swollen,IV team notified.

## 2016-03-11 NOTE — Progress Notes (Signed)
PROGRESS NOTE    Carolyn Barton  [id number] DOB: 08-13-1956 DOA: 02/29/2016   PCP: Marton Redwood, MD   Brief Narrative:  60 y.o. female with chronic back pain, thrombocytosis, depression, tachycardia, who presented with nausea, vomiting, abdominal pain and constipation. Imaging studies notable for colonic mass in distal sigmoid colon, measuring 6.6 x 3.2 x 3.8 cm, resulting in partial obstruction, compatible with primary colonic malignancy, small adjacent nodules, concerning for local spread of disease. Pt is now S/p resection on 6/5.  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 Brantley Stage 03/03/16 - diet advanced to regular and pt so far tolerating well  - provide analgesia as needed  - encourage OOB to chair and ambulating with assistance  - also encouraged use of spirometer while awake, educated in proper use of spirometer and importance of its use in overall recovery, currently gets it up to 550 cc - appreciate surgery team following   SIRS secondary to Cellulitis 6/12, bacteremia with g+ cocci in clusters (preliminar report) - erythema and TTP extending from the outside the wounds site at the umbilical level and towards the left flank area - looks more red this AM and WBC up from 8 --> 11 --> 14 K, no fevers overnight but still tachy  - I have outlined the borders of the erythema on the abd area and it appears that it is getting better but still warm to touch and TTP - clindamycin was started on 6/12 but now changed to Primaxin and Vancomycin   Hypotension/tachycardia - appears to be reactive and I  suspect that extra volume also contributing to tachycardia as well as now cellulitis as outlined above  - ABX as noted above  - gave one dose of Lasix 20 mg IV 6/10 and pt's weight is down from 162 --> 159 lbs --> 153 lbs --> pending this AM - will hold off on Lasix for now and see how pt does on her own, perhaps she will continue to diurese without lasix  - Ok for transfer out of SDU   Pleural effusions, anasarca - improving, weight trending down as outlined above, weight pending this AM - hold Lasix today and may need to give extra dose in AM if weight not trending down  - continue to monitor daily weights, strict I/O  Leukocytosis - from leukopenia to leukocytosis in 24 hours: 2.8 --> 11.7 --> 8.3 --> 11 --. 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 - pain controlled on chronic home regimen with methadone   Anemia of acute illness - with some post op bleed contributing as well - HG slightly down this AM - CBC in AM  Thrombocytosis, essential  - resolved  - CBC in AM  Hypokalemia and hypomagnesemia - supplemented and both electrolytes at target range  - BMP and Mg level in AM  N/V - resolved  - allow zofran as needed   Hyponatremia - trending down, monitor  -  BMP in AM  Urinary retention - foley taken out but pt with 700 cc in bladder after 8 hours of foley being taken out  - will try in and out cath and if still no urine output later, may need foley to be placed back in   DVT prophylaxis:  SCD's  Code Status: Full Code  Family Communication: Husband at bedside, his questions were answered. Husband also educated on how to help his wife with spirometer.   Disposition Plan:  Home once cleared by surgery team   Consultants:   Surgery   Oncology   Procedures:   Colonic  resection  Subjective: Fast HR still this AM. Still with TTP at the left abd area.   Objective: Filed Vitals:   03/11/16 1224 03/11/16 1225 03/11/16 1420 03/11/16 1630  BP:  113/60 101/62 95/58  Pulse:  121 123 122  Temp: 98.2 F (36.8 C)  98.6 F (37 C) 98 F (36.7 C)  TempSrc: Oral  Oral Oral  Resp:  20 26 25   Height:      Weight:      SpO2:  95% 100% 98%    Intake/Output Summary (Last 24 hours) at 03/11/16 1653 Last data filed at 03/11/16 1421  Gross per 24 hour  Intake    530 ml  Output   1750 ml  Net  -1220 ml   Filed Weights   03/10/16 0650 03/10/16 2136 03/11/16 0500  Weight: 69.5 kg (153 lb 3.5 oz) 69.6 kg (153 lb 7 oz) 72.2 kg (159 lb 2.8 oz)    Examination:  General exam: more awake and interactive today Respiratory system: mild tachypnea with diminished breath sounds at bases and crackles  Cardiovascular system: S1 & S2 heard, tachy-sinus Gastrointestinal system: non tender, open site looks ok, bit dry, at the umbilical level and outside the wounds border, area of erythema and TTP and warmth to touch that extends toward left flank, less red but still TTP Central nervous system: Alert and oriented. No focal neurological deficits. Extremities: Lower and upper extremity edema still present but overall improving   Data Reviewed: I have personally reviewed following labs and imaging studies  CBC:  Recent Labs Lab 03/07/16 0344 03/08/16 0252 03/09/16 0504 03/10/16 0406 03/11/16 0241  WBC 8.8 6.8 8.3 11.4* 14.3*  HGB 9.0* 9.1* 9.0* 8.7* 7.7*  HCT 28.7* 28.7* 27.9* 26.8* 23.5*  MCV 68.2* 67.1* 66.4* 65.5* 65.6*  PLT 336 312 320 324 [id number]   Basic Metabolic Panel:  Recent Labs Lab 03/07/16 0344 03/08/16 0252 03/09/16 0504 03/10/16 0406 03/11/16 0241  NA 134* 133* 130* 130* 127*  K 2.9* 2.7* 2.8* 3.7 4.7  CL 105 103 99* 98* 98*  CO2 23 23 23 26 24   GLUCOSE 97 95 71 99 79  BUN <5* 7 7 8  5*  CREATININE 0.44 0.46 0.40* 0.38* 0.39*  CALCIUM 7.8*  7.8* 7.8* 7.6* 7.7*  MG  --   --  1.5* 1.8 1.8   CBG:  Recent Labs Lab 03/08/16 0722 03/09/16 0742 03/09/16 0852 03/10/16 0722 03/11/16 0746  GLUCAP 92 66 101* 95 93   Urine analysis:    Component Value Date/Time   COLORURINE AMBER* 03/06/2016 0157   APPEARANCEUR CLEAR 03/06/2016 0157   LABSPEC 1.019 03/06/2016 0157   PHURINE 5.5 03/06/2016 0157   GLUCOSEU 100* 03/06/2016 0157   HGBUR SMALL* 03/06/2016 0157   BILIRUBINUR NEGATIVE 03/06/2016 0157   KETONESUR NEGATIVE 03/06/2016 0157   PROTEINUR 30* 03/06/2016 0157   UROBILINOGEN  1.0 12/18/2012 1754   NITRITE NEGATIVE 03/06/2016 0157   LEUKOCYTESUR NEGATIVE 03/06/2016 0157   Recent Results (from the past 240 hour(s))  Surgical PCR screen     Status: None   Collection Time: 03/03/16  4:00 AM  Result Value Ref Range Status   MRSA, PCR NEGATIVE NEGATIVE Final   Staphylococcus aureus NEGATIVE NEGATIVE Final    Anti-infectives    Start     Dose/Rate Route Frequency Ordered Stop   03/12/16 0400  vancomycin (VANCOCIN) IVPB 1000 mg/200 mL premix     1,000 mg 200 mL/hr over 60 Minutes Intravenous Every 12 hours 03/11/16 1513     03/11/16 1600  vancomycin (VANCOCIN) 1,500 mg in sodium chloride 0.9 % 500 mL IVPB     1,500 mg 250 mL/hr over 120 Minutes Intravenous  Once 03/11/16 1513     03/11/16 0900  imipenem-cilastatin (PRIMAXIN) 500 mg in sodium chloride 0.9 % 100 mL IVPB     500 mg 200 mL/hr over 30 Minutes Intravenous Every 6 hours 03/11/16 0738     03/10/16 1400  clindamycin (CLEOCIN) IVPB 600 mg  Status:  Discontinued     600 mg 100 mL/hr over 30 Minutes Intravenous Every 8 hours 03/10/16 1104 03/11/16 0738   03/10/16 1145  cefTRIAXone (ROCEPHIN) 1 g in dextrose 5 % 50 mL IVPB  Status:  Discontinued     1 g 100 mL/hr over 30 Minutes Intravenous Every 24 hours 03/10/16 1138 03/10/16 1144   03/03/16 0846  cefoTEtan in Dextrose 5% (CEFOTAN) 2-2.08 GM-% IVPB    Comments:  Forte, Lindsi   : cabinet override      03/03/16  0846 03/03/16 2059   03/03/16 0600  cefoTEtan (CEFOTAN) 2 g in dextrose 5 % 50 mL IVPB    Comments:  In OR holding pre op   2 g 100 mL/hr over 30 Minutes Intravenous On call to O.R. 03/02/16 1054 03/03/16 1009   03/02/16 1000  cefoTEtan (CEFOTAN) 2 g in dextrose 5 % 50 mL IVPB  Status:  Discontinued    Comments:  In OR holding pre op   2 g 100 mL/hr over 30 Minutes Intravenous Every 12 hours 03/02/16 0959 03/02/16 1054     Radiology Studies: Ct Abdomen Pelvis W Contrast  03/10/2016  CLINICAL DATA:  Pt with follow-up on blister, infected, abdominal wall, mass of colon. EXAM: CT ABDOMEN AND PELVIS WITH CONTRAST TECHNIQUE: Multidetector CT imaging of the abdomen and pelvis was performed using the standard protocol following bolus administration of intravenous contrast. CONTRAST:  100 ml ISOVUE-300 IOPAMIDOL (ISOVUE-300) INJECTION 61% COMPARISON:  02/29/2016 FINDINGS: Since the prior exam, patient has undergone surgery with formation of a left lower quadrant colostomy decompressing the bowel obstruction caused by the apparent distal colonic mass. Subcutaneous air surrounds the colostomy. The skin and subcutaneous soft tissues along the anterior abdominal wall incision remains open. The fascia is closed. There are air-fluid levels within the stomach, small bowel and colon consistent with a postoperative adynamic ileus. There is no significant bowel distention. Diffuse edema is seen throughout subcutaneous soft tissues, most evident along the flanks, new since the prior exam. There is no defined fluid collection within the abdomen or the abdominal wall suggest an abscess. Lung bases: Small effusions, right greater than left, with associated lung base atelectasis. Stable hiatal hernia. Hepatobiliary: Subtle liver lesions noted on the prior study are not resolved on this unenhanced scan. Liver unremarkable. Gallbladder is unremarkable. No bile duct dilation. Spleen, pancreas, adrenal glands:  Unremarkable.  Kidneys, ureters, bladder: No renal mass or stone. No hydronephrosis. Ureters not well-defined. Bladder with nondependent air likely from recent instrumentation. Peritoneal cavity: Small amount ascites. Minimal free air likely postoperative in origin. Lymph nodes: No definitive enlarged lymph nodes. Small nodules in the pelvis noted on the prior study are less well-defined. Musculoskeletal: Stable postsurgical changes from L5-S1 fusion. No osteoblastic or osteolytic lesions. IMPRESSION: 1. Since the prior exam, the patient has undergone abdominal surgery with formation of a left lower quadrant colostomy. There is subcutaneous air surrounding the colostomy. There is no significant residual bowel obstruction. Air-fluid levels within the bowel is consistent with a postoperative adynamic ileus. There is also diffuse subcutaneous edema which is new. The skin and subcutaneous soft tissues of the anterior abdominal wall incision are open. The fascia is closed. 2. There is a small amount of ascites, which has increased from the prior exam. 3. Small pleural effusions with lung base atelectasis is new from the prior study. 4. There is no defined fluid collection to suggest an abscess. Electronically Signed   By: Lajean Manes M.D.   On: 03/10/2016 21:00    Scheduled Meds: . antiseptic oral rinse  7 mL Mouth Rinse q12n4p  . chlorhexidine  15 mL Mouth Rinse BID  . DULoxetine  60 mg Oral Daily  . enoxaparin (LOVENOX) injection  40 mg Subcutaneous Daily  . feeding supplement (ENSURE ENLIVE)  237 mL Oral TID BM  . imipenem-cilastatin  500 mg Intravenous Q6H  . methadone  10 mg Oral TID  . sodium chloride flush  3 mL Intravenous Q12H  . tamsulosin  0.4 mg Oral Daily  . vancomycin  1,500 mg Intravenous Once  . [START ON 03/12/2016] vancomycin  1,000 mg Intravenous Q12H   Continuous Infusions:     LOS: 10 days    Time spent: 35 min  Faye Ramsay, DO [facility] Pager [phone]  If 7PM-7AM,  please contact night-coverage [email or web address] Password [facility] 03/11/2016, 4:53 PM

## 2016-03-11 NOTE — Progress Notes (Signed)
Physical Therapy Treatment Patient Details Name: Carolyn Barton MRN: [id number] DOB: 11-18-1955 Today's Date: 03/11/2016    History of Present Illness Carolyn Barton is a 60 y.o. female with PMH significant for lumbar radiculopathy, scoliosis, depression, thrombocytosis, and tachycardia who presented to the ED with c/o abdominal pain with N/V and constipation x 1 week. CT scan revealed a colonic mass at the distal sigmoid colon resulting in partial obstruction compatible with primary colonic malignancy, small adjacent nodules concerning for local spread of disease, non specific hypodensities in the liver, and no hydroureteronephrosis. WBC 11, Cr 0.56 on admission. She is s/p colon resection with colostomy 03/03/16 by Dr. Brantley Stage. During the procedure he noted that the cancer was densely adherent to the left pelvic sidewall where the ureter entered. The two structures could not be easily separated. He documented in his op note that he felt she would likely require a left ureteral stent and chemotherapy due to multiple metastatic sites. Final path is pending but it appears to be adenocarcinoma. Oncology is evaluating the pt.     PT Comments    Pt is progressing nicely with gait and mobility, only one person assist today and we did not follow her with the chair.  Pt reporting memory issues since admission, thinks it is due to pain meds.  Husband confirms this is just since being in the hospital.  Significant improvements in gait and stability since last session.  I still believe she will progress well enough to d/c home with husband and home therapy.    Follow Up Recommendations  Home health PT;Supervision/Assistance - 24 hour     Equipment Recommendations  Rolling walker with 5" wheels;3in1 (PT)    Recommendations for Other Services   NA     Precautions / Restrictions Precautions Precautions: Fall Precaution Comments: weak and unsteady on her feet    Mobility  Bed Mobility Overal  bed mobility: Needs Assistance Bed Mobility: Rolling;Sidelying to Sit Rolling: Min assist Sidelying to sit: Mod assist       General bed mobility comments: Min assist to help pt find railing to roll, mod assist to support trunk during transition up to sitting EOB.   Transfers Overall transfer level: Needs assistance Equipment used: Rolling walker (2 wheeled) Transfers: Sit to/from Stand Sit to Stand: Mod assist         General transfer comment: Mod assist to support trunk as she transitioned up over weak legs.  Pt needed verbal cues for safe hand placement.   Ambulation/Gait Ambulation/Gait assistance: Min assist Ambulation Distance (Feet): 150 Feet Assistive device: Rolling walker (2 wheeled) Gait Pattern/deviations: Step-through pattern;Shuffle;Trunk flexed Gait velocity: decreased Gait velocity interpretation: Below normal speed for age/gender General Gait Details: Pt with slow, shuffling gait pattern, flexed trunk posture and reliance on RW for balance and support.         Balance Overall balance assessment: Needs assistance Sitting-balance support: Feet supported;Bilateral upper extremity supported Sitting balance-Leahy Scale: Fair     Standing balance support: Bilateral upper extremity supported Standing balance-Leahy Scale: Poor                      Cognition Arousal/Alertness: Awake/alert Behavior During Therapy: WFL for tasks assessed/performed Overall Cognitive Status: Impaired/Different from baseline Area of Impairment: Memory     Memory: Decreased short-term memory                     Pertinent Vitals/Pain Pain Assessment: Faces Faces Pain Scale: Hurts  even more Pain Location: abdomen and low back Pain Descriptors / Indicators: Aching;Burning Pain Intervention(s): Limited activity within patient's tolerance;Monitored during session;Repositioned           PT Goals (current goals can now be found in the care plan section) Acute  Rehab PT Goals Patient Stated Goal: to go home Progress towards PT goals: Progressing toward goals    Frequency  Min 3X/week    PT Plan Current plan remains appropriate       End of Session   Activity Tolerance: Patient limited by pain Patient left: in chair;with call bell/phone within reach;with family/visitor present     Time: [id number] PT Time Calculation (min) (ACUTE ONLY): 21 min  Charges:  $Gait Training: 8-22 mins                      Aiden Rao B. Brook, [place], DPT [phone]   03/11/2016, 4:54 PM

## 2016-03-12 DIAGNOSIS — D72829 Elevated white blood cell count, unspecified: Secondary | ICD-10-CM | POA: Diagnosis present

## 2016-03-12 DIAGNOSIS — R1013 Epigastric pain: Secondary | ICD-10-CM

## 2016-03-12 LAB — CBC
HCT: 23.1 % — ABNORMAL LOW (ref 36.0–46.0)
Hemoglobin: 7.3 g/dL — ABNORMAL LOW (ref 12.0–15.0)
MCH: 21 pg — ABNORMAL LOW (ref 26.0–34.0)
MCHC: 31.6 g/dL (ref 30.0–36.0)
MCV: 66.6 fL — ABNORMAL LOW (ref 78.0–100.0)
Platelets: 455 10*3/uL — ABNORMAL HIGH (ref 150–400)
RBC: 3.47 MIL/uL — ABNORMAL LOW (ref 3.87–5.11)
RDW: 16.6 % — ABNORMAL HIGH (ref 11.5–15.5)
WBC: 20.9 10*3/uL — ABNORMAL HIGH (ref 4.0–10.5)

## 2016-03-12 LAB — BASIC METABOLIC PANEL
ANION GAP: 4 — AB (ref 5–15)
BUN: 6 mg/dL (ref 6–20)
CHLORIDE: 96 mmol/L — AB (ref 101–111)
CO2: 26 mmol/L (ref 22–32)
Calcium: 7.5 mg/dL — ABNORMAL LOW (ref 8.9–10.3)
Creatinine, Ser: 0.41 mg/dL — ABNORMAL LOW (ref 0.44–1.00)
Glucose, Bld: 99 mg/dL (ref 65–99)
POTASSIUM: 3.9 mmol/L (ref 3.5–5.1)
SODIUM: 126 mmol/L — AB (ref 135–145)

## 2016-03-12 NOTE — Progress Notes (Signed)
Patient ID: Carolyn Barton, female   DOB: Aug 28, 1956, 60 y.o.   MRN: [id number]     [facility]      [address] [place], [place] [address]    Phone: [phone] FAX: [phone]     Subjective: No n/v.  Appetite is improving.  WBC up, afebrile. More active. Foley replaced.   Objective:  Vital signs:  Filed Vitals:   03/11/16 1630 03/11/16 2102 03/12/16 0459 03/12/16 0500  BP: 95/58 105/79 124/86   Pulse: 122 118 116   Temp: 98 F (36.7 C) 98.9 F (37.2 C) 98.4 F (36.9 C)   TempSrc: Oral Oral Oral   Resp: '25 20 20   '$ Height:      Weight:    71.4 kg (157 lb 6.5 oz)  SpO2: 98% 93% 93%     Last BM Date: 03/08/16  Intake/Output   Yesterday:  06/13 0701 - 06/14 0700 In: 555 [P.O.:555] Out: 1350 [Urine:1200; Stool:150] This shift:    I/O last 3 completed shifts: In: 30 [P.O.:555; IV Piggyback:50] Out: 2300 [Urine:1550; Stool:750]   Physical Exam: General: Pt awake/alert/oriented x4 in no acute distress  Abdomen: Soft.  Nondistended.  Tender around incision.  Incision without drainage or odor, patches of necrosis with intact fascia. llq stoma is pink and viable.  Left abdominal wall cellulitis, improved.   Problem List:   Principal Problem:   Mass of colon Active Problems:   Essential thrombocytosis (HCC)   Lumbar radiculopathy   Bowel obstruction (HCC)   Nausea & vomiting   Malnutrition of moderate degree   Cellulitis    Results:   Labs: Results for orders placed or performed during the hospital encounter of 02/29/16 (from the past 48 hour(s))  Culture, blood (routine x 2)     Status: None (Preliminary result)   Collection Time: 03/10/16  1:45 PM  Result Value Ref Range   Specimen Description BLOOD LEFT FOREARM    Special Requests BOTTLES DRAWN AEROBIC AND ANAEROBIC 10CC    Culture  Setup Time      GRAM POSITIVE COCCI IN CLUSTERS IN BOTH AEROBIC AND ANAEROBIC BOTTLES CRITICAL RESULT CALLED TO,  READ BACK BY AND VERIFIED WITH: C. STEWART, [place] D AT 1434 ON [id number] BY S. YARBROUGH    Culture NO GROWTH 1 DAY    Report Status PENDING   Culture, blood (routine x 2)     Status: None (Preliminary result)   Collection Time: 03/10/16  2:00 PM  Result Value Ref Range   Specimen Description BLOOD LEFT HAND    Special Requests BOTTLES DRAWN AEROBIC AND ANAEROBIC 10CC    Culture  Setup Time      GRAM POSITIVE COCCI IN CLUSTERS IN BOTH AEROBIC AND ANAEROBIC BOTTLES Organism ID to follow CRITICAL RESULT CALLED TO, READ BACK BY AND VERIFIED WITH: J. Darleen Crocker D AT 1115 ON [id number] BY M. WILSON    Culture NO GROWTH 1 DAY    Report Status PENDING   Blood Culture ID Panel (Reflexed)     Status: Abnormal   Collection Time: 03/10/16  2:00 PM  Result Value Ref Range   Enterococcus species NOT DETECTED NOT DETECTED   Vancomycin resistance NOT DETECTED NOT DETECTED   Listeria monocytogenes NOT DETECTED NOT DETECTED   Staphylococcus species DETECTED (A) NOT DETECTED    Comment: CRITICAL RESULT CALLED TO, READ BACK BY AND VERIFIED WITH: J. Frens Pharm.D. 11:15 03/11/16 (wilsonm)    Staphylococcus aureus NOT DETECTED  NOT DETECTED   Methicillin resistance DETECTED (A) NOT DETECTED    Comment: CRITICAL RESULT CALLED TO, READ BACK BY AND VERIFIED WITH: J. Frens Pharm.D. 11:15 03/11/16 (wilsonm)    Streptococcus species NOT DETECTED NOT DETECTED   Streptococcus agalactiae NOT DETECTED NOT DETECTED   Streptococcus pneumoniae NOT DETECTED NOT DETECTED   Streptococcus pyogenes NOT DETECTED NOT DETECTED   Acinetobacter baumannii NOT DETECTED NOT DETECTED   Enterobacteriaceae species NOT DETECTED NOT DETECTED   Enterobacter cloacae complex NOT DETECTED NOT DETECTED   Escherichia coli NOT DETECTED NOT DETECTED   Klebsiella oxytoca NOT DETECTED NOT DETECTED   Klebsiella pneumoniae NOT DETECTED NOT DETECTED   Proteus species NOT DETECTED NOT DETECTED   Serratia marcescens NOT DETECTED NOT DETECTED    Carbapenem resistance NOT DETECTED NOT DETECTED   Haemophilus influenzae NOT DETECTED NOT DETECTED   Neisseria meningitidis NOT DETECTED NOT DETECTED   Pseudomonas aeruginosa NOT DETECTED NOT DETECTED   Candida albicans NOT DETECTED NOT DETECTED   Candida glabrata NOT DETECTED NOT DETECTED   Candida krusei NOT DETECTED NOT DETECTED   Candida parapsilosis NOT DETECTED NOT DETECTED   Candida tropicalis NOT DETECTED NOT DETECTED  CBC     Status: Abnormal   Collection Time: 03/11/16  2:41 AM  Result Value Ref Range   WBC 14.3 (H) 4.0 - 10.5 K/uL   RBC 3.58 (L) 3.87 - 5.11 MIL/uL   Hemoglobin 7.7 (L) 12.0 - 15.0 g/dL   HCT 23.5 (L) 36.0 - 46.0 %   MCV 65.6 (L) 78.0 - 100.0 fL   MCH 21.5 (L) 26.0 - 34.0 pg   MCHC 32.8 30.0 - 36.0 g/dL   RDW 16.1 (H) 11.5 - 15.5 %   Platelets 354 150 - 400 K/uL  Basic metabolic panel     Status: Abnormal   Collection Time: 03/11/16  2:41 AM  Result Value Ref Range   Sodium 127 (L) 135 - 145 mmol/L   Potassium 4.7 3.5 - 5.1 mmol/L    Comment: DELTA CHECK NOTED   Chloride 98 (L) 101 - 111 mmol/L   CO2 24 22 - 32 mmol/L   Glucose, Bld 79 65 - 99 mg/dL   BUN 5 (L) 6 - 20 mg/dL   Creatinine, Ser 0.39 (L) 0.44 - 1.00 mg/dL   Calcium 7.7 (L) 8.9 - 10.3 mg/dL   GFR calc non Af Amer >60 >60 mL/min   GFR calc Af Amer >60 >60 mL/min    Comment: (NOTE) The eGFR has been calculated using the CKD EPI equation. This calculation has not been validated in all clinical situations. eGFR's persistently <60 mL/min signify possible Chronic Kidney Disease.    Anion gap 5 5 - 15  Magnesium     Status: None   Collection Time: 03/11/16  2:41 AM  Result Value Ref Range   Magnesium 1.8 1.7 - 2.4 mg/dL  Glucose, capillary     Status: None   Collection Time: 03/11/16  7:46 AM  Result Value Ref Range   Glucose-Capillary 93 65 - 99 mg/dL   Comment 1 Notify RN    Comment 2 Document in Chart   CBC     Status: Abnormal   Collection Time: 03/12/16  3:40 AM  Result Value  Ref Range   WBC 20.9 (H) 4.0 - 10.5 K/uL   RBC 3.47 (L) 3.87 - 5.11 MIL/uL   Hemoglobin 7.3 (L) 12.0 - 15.0 g/dL   HCT 23.1 (L) 36.0 - 46.0 %   MCV 66.6 (  L) 78.0 - 100.0 fL   MCH 21.0 (L) 26.0 - 34.0 pg   MCHC 31.6 30.0 - 36.0 g/dL   RDW 16.6 (H) 11.5 - 15.5 %   Platelets 455 (H) 150 - 400 K/uL  Basic metabolic panel     Status: Abnormal   Collection Time: 03/12/16  3:40 AM  Result Value Ref Range   Sodium 126 (L) 135 - 145 mmol/L   Potassium 3.9 3.5 - 5.1 mmol/L    Comment: DELTA CHECK NOTED NO VISIBLE HEMOLYSIS    Chloride 96 (L) 101 - 111 mmol/L   CO2 26 22 - 32 mmol/L   Glucose, Bld 99 65 - 99 mg/dL   BUN 6 6 - 20 mg/dL   Creatinine, Ser 0.41 (L) 0.44 - 1.00 mg/dL   Calcium 7.5 (L) 8.9 - 10.3 mg/dL   GFR calc non Af Amer >60 >60 mL/min   GFR calc Af Amer >60 >60 mL/min    Comment: (NOTE) The eGFR has been calculated using the CKD EPI equation. This calculation has not been validated in all clinical situations. eGFR's persistently <60 mL/min signify possible Chronic Kidney Disease.    Anion gap 4 (L) 5 - 15    Imaging / Studies: Ct Abdomen Pelvis W Contrast  03/10/2016  CLINICAL DATA:  Pt with follow-up on blister, infected, abdominal wall, mass of colon. EXAM: CT ABDOMEN AND PELVIS WITH CONTRAST TECHNIQUE: Multidetector CT imaging of the abdomen and pelvis was performed using the standard protocol following bolus administration of intravenous contrast. CONTRAST:  100 ml ISOVUE-300 IOPAMIDOL (ISOVUE-300) INJECTION 61% COMPARISON:  02/29/2016 FINDINGS: Since the prior exam, patient has undergone surgery with formation of a left lower quadrant colostomy decompressing the bowel obstruction caused by the apparent distal colonic mass. Subcutaneous air surrounds the colostomy. The skin and subcutaneous soft tissues along the anterior abdominal wall incision remains open. The fascia is closed. There are air-fluid levels within the stomach, small bowel and colon consistent with a  postoperative adynamic ileus. There is no significant bowel distention. Diffuse edema is seen throughout subcutaneous soft tissues, most evident along the flanks, new since the prior exam. There is no defined fluid collection within the abdomen or the abdominal wall suggest an abscess. Lung bases: Small effusions, right greater than left, with associated lung base atelectasis. Stable hiatal hernia. Hepatobiliary: Subtle liver lesions noted on the prior study are not resolved on this unenhanced scan. Liver unremarkable. Gallbladder is unremarkable. No bile duct dilation. Spleen, pancreas, adrenal glands:  Unremarkable. Kidneys, ureters, bladder: No renal mass or stone. No hydronephrosis. Ureters not well-defined. Bladder with nondependent air likely from recent instrumentation. Peritoneal cavity: Small amount ascites. Minimal free air likely postoperative in origin. Lymph nodes: No definitive enlarged lymph nodes. Small nodules in the pelvis noted on the prior study are less well-defined. Musculoskeletal: Stable postsurgical changes from L5-S1 fusion. No osteoblastic or osteolytic lesions. IMPRESSION: 1. Since the prior exam, the patient has undergone abdominal surgery with formation of a left lower quadrant colostomy. There is subcutaneous air surrounding the colostomy. There is no significant residual bowel obstruction. Air-fluid levels within the bowel is consistent with a postoperative adynamic ileus. There is also diffuse subcutaneous edema which is new. The skin and subcutaneous soft tissues of the anterior abdominal wall incision are open. The fascia is closed. 2. There is a small amount of ascites, which has increased from the prior exam. 3. Small pleural effusions with lung base atelectasis is new from the prior study. 4. There  is no defined fluid collection to suggest an abscess. Electronically Signed   By: Lajean Manes M.D.   On: 03/10/2016 21:00    Medications / Allergies:  Scheduled Meds: .  antiseptic oral rinse  7 mL Mouth Rinse q12n4p  . chlorhexidine  15 mL Mouth Rinse BID  . DULoxetine  60 mg Oral Daily  . enoxaparin (LOVENOX) injection  40 mg Subcutaneous Daily  . feeding supplement (ENSURE ENLIVE)  237 mL Oral TID BM  . imipenem-cilastatin  500 mg Intravenous Q6H  . methadone  10 mg Oral TID  . sodium chloride flush  3 mL Intravenous Q12H  . tamsulosin  0.4 mg Oral Daily  . vancomycin  1,000 mg Intravenous Q12H   Continuous Infusions:  PRN Meds:.acetaminophen **OR** acetaminophen, HYDROmorphone (DILAUDID) injection, LORazepam, methocarbamol (ROBAXIN)  IV, metoprolol, oxyCODONE, traMADol  Antibiotics: Anti-infectives    Start     Dose/Rate Route Frequency Ordered Stop   03/12/16 0400  vancomycin (VANCOCIN) IVPB 1000 mg/200 mL premix     1,000 mg 200 mL/hr over 60 Minutes Intravenous Every 12 hours 03/11/16 1513     03/11/16 1600  vancomycin (VANCOCIN) 1,500 mg in sodium chloride 0.9 % 500 mL IVPB     1,500 mg 250 mL/hr over 120 Minutes Intravenous  Once 03/11/16 1513 03/11/16 2041   03/11/16 0900  imipenem-cilastatin (PRIMAXIN) 500 mg in sodium chloride 0.9 % 100 mL IVPB     500 mg 200 mL/hr over 30 Minutes Intravenous Every 6 hours 03/11/16 0738     03/10/16 1400  clindamycin (CLEOCIN) IVPB 600 mg  Status:  Discontinued     600 mg 100 mL/hr over 30 Minutes Intravenous Every 8 hours 03/10/16 1104 03/11/16 0738   03/10/16 1145  cefTRIAXone (ROCEPHIN) 1 g in dextrose 5 % 50 mL IVPB  Status:  Discontinued     1 g 100 mL/hr over 30 Minutes Intravenous Every 24 hours 03/10/16 1138 03/10/16 1144   03/03/16 0846  cefoTEtan in Dextrose 5% (CEFOTAN) 2-2.08 GM-% IVPB    Comments:  Forte, Lindsi   : cabinet override      03/03/16 0846 03/03/16 2059   03/03/16 0600  cefoTEtan (CEFOTAN) 2 g in dextrose 5 % 50 mL IVPB    Comments:  In OR holding pre op   2 g 100 mL/hr over 30 Minutes Intravenous On call to O.R. 03/02/16 1054 03/03/16 1009   03/02/16 1000  cefoTEtan  (CEFOTAN) 2 g in dextrose 5 % 50 mL IVPB  Status:  Discontinued    Comments:  In OR holding pre op   2 g 100 mL/hr over 30 Minutes Intravenous Every 12 hours 03/02/16 0959 03/02/16 1054         Assessment/Plan Metastatic adenocarcinoma of the colon(Dr. Benay Spice)  POD#9 exploratory laparotomy, descending colostomy, mucus fistula, repair of chronic serosal tears---Dr. Brantley Stage 03/03/16 -regular diet, appetite improving -Continue with methadone, PRN dilaudid -mobilize, IS -stop dakin's, resume BID wet to dry dressing changes ID-GPC bacteremia +abdominal wall cellulitis, Vanc and primaxin.  Left abdominal wall cellulitis-continue with antibiotics, monitor for developing fluid collection  Urinary retention-foley once again replaced, management per urology.  Plans for JJ stents next week(Dr. Gaynelle Arabian) Hypotension/tachycardia-BP stable, still tachy, control pain. PRN metoprolol per primary team.  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 see above VTE prophylaxis-scd, lovenox Dispo-continue inpatient    Erby Pian, [facility] Surgery Pager [phone](7A-4:30P) For consults and floor pages call [phone](7A-4:30P)  03/12/2016 7:42 AM

## 2016-03-12 NOTE — Progress Notes (Signed)
Assessment: Metastatic Ca. of the rectosigmoid, post resection; with recurrent post op urinary retention with elevated post void residuals with out sensation of retention. Possibilities for urinary retention may be simply post op state with abd pain and infection; lack of mobility; but could reflect neurogenic component of hypotonic bladder 2ndary to bladder nerve disruption from low anterior colonic surgery ( will not really know for about 3 months).   Plan: Tenitively planning for L JJ stent for next week-? Tuesday evening. Hopefully pt will be discharged from [facility] and we can do at [facility] daytime or at [facility] as add-on ( all Urology equipment there). Also, agree with Dr. Olen Pel that in and out cath would be preferred way to handle urinary retention if possible. OK to leave pt overnight after I/O cath at 11 pm, with early AM  Void, PVR by u/s and then I/O cath if pvr > 200cc. In general post void  I/o cath for pvr > 200cc q 6 h after checking pvr by u/s first.     Subjective: 60 yo female: 6.6 x 3.2 x 3.8 cm distal sigmoid colonic mass with partial bowel obstruction, post resection June 05,  With diverting colostomy; with finding of local carcinomatosis involving the Left ureter. Pt will need f/u chemotherapy, and, while she has no hydronephrosis pre-op, is high risk for hydronephrosis 2ndary to external ureteral obstruction from her tumor-which would limit her chemotherapy dosage. Therefore, in consultation with GS ( Dr.Coronett) and Oncology ( Dr. Benay Spice), Urology asked to place Left JJ stent. Hospital course complicated by cellulitis ( gm +m cocci).    In addition, pt has had recurrent urinary retention. She had PVR 70cc last night, without sensation of fullness. Foley catheter replaced overnight.     Objective: Vital signs in last 24 hours: Temp:  [98 F (36.7 C)-98.9 F (37.2 C)] 98.4 F (36.9 C) (06/14 0459) Pulse Rate:  [116-123] 116 (06/14 0459) Resp:  [20-26] 20 (06/14 0459) BP:  (95-124)/(57-86) 124/86 mmHg (06/14 0459) SpO2:  [93 %-100 %] 93 % (06/14 0459) Weight:  [71.4 kg (157 lb 6.5 oz)] 71.4 kg (157 lb 6.5 oz) (06/14 0500)A  Intake/Output from previous day: 06/13 0701 - 06/14 0700 In: 555 [P.O.:555] Out: 1350 [Urine:1200; Stool:150] Intake/Output this shift:    Past Medical History  Diagnosis Date  . PONV (postoperative nausea and vomiting)   . Headache(784.0)     not since surgery for spinal leak  . Blood dyscrasia     thrombocytosis not on any meds  . Chronic back pain   . Anemia     History  . Essential thrombocytosis (HCC)     pt. denies this diagnosis  . Tachycardia     Physical Exam:  Lungs - Normal respiratory effort, chest expands symmetrically.  Abdomen - Soft, non-tender & non-distended.  Lab Results:  Recent Labs  03/10/16 0406 03/11/16 0241 03/12/16 0340  WBC 11.4* 14.3* 20.9*  HGB 8.7* 7.7* 7.3*  HCT 26.8* 23.5* 23.1*   BMET  Recent Labs  03/11/16 0241 03/12/16 0340  NA 127* 126*  K 4.7 3.9  CL 98* 96*  CO2 24 26  GLUCOSE 79 99  BUN 5* 6  CREATININE 0.39* 0.41*  CALCIUM 7.7* 7.5*   No results for input(s): LABURIN in the last 72 hours. Results for orders placed or performed during the hospital encounter of 02/29/16  Surgical PCR screen     Status: None   Collection Time: 03/03/16  4:00 AM  Result Value Ref Range  Status   MRSA, PCR NEGATIVE NEGATIVE Final   Staphylococcus aureus NEGATIVE NEGATIVE Final    Comment:        The Xpert SA Assay (FDA approved for NASAL specimens in patients over 7 years of age), is one component of a comprehensive surveillance program.  Test performance has been validated by [facility] for patients greater than or equal to 92 year old. It is not intended to diagnose infection nor to guide or monitor treatment.   Urine culture     Status: None   Collection Time: 03/06/16  1:57 AM  Result Value Ref Range Status   Specimen Description URINE, CATHETERIZED  Final    Special Requests NONE  Final   Culture NO GROWTH  Final   Report Status 03/07/2016 FINAL  Final  Culture, blood (routine x 2)     Status: None (Preliminary result)   Collection Time: 03/10/16  1:45 PM  Result Value Ref Range Status   Specimen Description BLOOD LEFT FOREARM  Final   Special Requests BOTTLES DRAWN AEROBIC AND ANAEROBIC 10CC  Final   Culture  Setup Time   Final    GRAM POSITIVE COCCI IN CLUSTERS IN BOTH AEROBIC AND ANAEROBIC BOTTLES CRITICAL RESULT CALLED TO, READ BACK BY AND VERIFIED WITH: C. STEWART, PHARM D AT [id number] ON [id number] BY S. YARBROUGH    Culture NO GROWTH 1 DAY  Final   Report Status PENDING  Incomplete  Culture, blood (routine x 2)     Status: None (Preliminary result)   Collection Time: 03/10/16  2:00 PM  Result Value Ref Range Status   Specimen Description BLOOD LEFT HAND  Final   Special Requests BOTTLES DRAWN AEROBIC AND ANAEROBIC 10CC  Final   Culture  Setup Time   Final    GRAM POSITIVE COCCI IN CLUSTERS IN BOTH AEROBIC AND ANAEROBIC BOTTLES Organism ID to follow CRITICAL RESULT CALLED TO, READ BACK BY AND VERIFIED WITH: J. Darleen Crocker D AT 1115 ON [id number] BY M. WILSON    Culture NO GROWTH 1 DAY  Final   Report Status PENDING  Incomplete  Blood Culture ID Panel (Reflexed)     Status: Abnormal   Collection Time: 03/10/16  2:00 PM  Result Value Ref Range Status   Enterococcus species NOT DETECTED NOT DETECTED Final   Vancomycin resistance NOT DETECTED NOT DETECTED Final   Listeria monocytogenes NOT DETECTED NOT DETECTED Final   Staphylococcus species DETECTED (A) NOT DETECTED Final    Comment: CRITICAL RESULT CALLED TO, READ BACK BY AND VERIFIED WITH: J. Frens Pharm.D. 11:15 03/11/16 ([email or web address])    Staphylococcus aureus NOT DETECTED NOT DETECTED Final   Methicillin resistance DETECTED (A) NOT DETECTED Final    Comment: CRITICAL RESULT CALLED TO, READ BACK BY AND VERIFIED WITH: J. Frens Pharm.D. 11:15 03/11/16 ([email or web address])    Streptococcus species NOT  DETECTED NOT DETECTED Final   Streptococcus agalactiae NOT DETECTED NOT DETECTED Final   Streptococcus pneumoniae NOT DETECTED NOT DETECTED Final   Streptococcus pyogenes NOT DETECTED NOT DETECTED Final   Acinetobacter baumannii NOT DETECTED NOT DETECTED Final   Enterobacteriaceae species NOT DETECTED NOT DETECTED Final   Enterobacter cloacae complex NOT DETECTED NOT DETECTED Final   Escherichia coli NOT DETECTED NOT DETECTED Final   Klebsiella oxytoca NOT DETECTED NOT DETECTED Final   Klebsiella pneumoniae NOT DETECTED NOT DETECTED Final   Proteus species NOT DETECTED NOT DETECTED Final   Serratia marcescens NOT DETECTED NOT DETECTED Final   Carbapenem resistance NOT DETECTED NOT  DETECTED Final   Haemophilus influenzae NOT DETECTED NOT DETECTED Final   Neisseria meningitidis NOT DETECTED NOT DETECTED Final   Pseudomonas aeruginosa NOT DETECTED NOT DETECTED Final   Candida albicans NOT DETECTED NOT DETECTED Final   Candida glabrata NOT DETECTED NOT DETECTED Final   Candida krusei NOT DETECTED NOT DETECTED Final   Candida parapsilosis NOT DETECTED NOT DETECTED Final   Candida tropicalis NOT DETECTED NOT DETECTED Final    Studies/Results: No results found.    Jackee Glasner I Nate Common 03/12/2016, 7:05 AM

## 2016-03-12 NOTE — Progress Notes (Addendum)
PROGRESS NOTE    SHATONGA SCIULLI  [id number] DOB: 08/01/1956 DOA: 02/29/2016   PCP: Marton Redwood, MD   Brief Narrative:  60 y.o. female with chronic back pain, thrombocytosis, depression, tachycardia, who presented with nausea, vomiting, abdominal pain and constipation. Imaging studies notable for colonic mass in distal sigmoid colon, measuring 6.6 x 3.2 x 3.8 cm, resulting in partial obstruction, compatible with primary colonic malignancy, small adjacent nodules, concerning for local spread of disease. Pt is now S/p resection on 6/5.  Major events since admission: 6/12 - worsening cellulitis at the abd area, started Clindamycin, blood cultures obtained  6/13 - blood cultures 2/2 G+ cocci in clusters, ABX changed to Primaxin and Vancomycin, pt Ok to transfer out of SDU  6/14 - pt reports feeling better,   Assessment & Plan:   Colonic mass, Metastatic Ca. 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 Brantley Stage 03/03/16 - diet advanced to regular and pt so far tolerating well  - provide analgesia as needed  - encourage OOB to chair and ambulating with assistance  - also encouraged use of spirometer while awake, educated in proper use of spirometer and importance of its use in overall recovery, currently gets it up to 550 cc - appreciate surgery team following   SIRS secondary to Cellulitis 6/12, bacteremia with g+ cocci in clusters (preliminar report) - erythema and TTP extending from the outside the wounds site at the umbilical level and towards the left flank area, looks better  - unclear why WBC up from 8 --> 11 --> 14 --> 20 K, no fevers overnight but still tachy  - continue Primaxin and Vancomycin, d/w ID team   Hypotension/tachycardia - appears to be reactive and I suspect that extra volume also contributing to tachycardia as well as now  cellulitis as outlined above  - ABX as noted above  - gave one dose of Lasix 20 mg IV 6/10 and pt's weight is down from 162 --> 159 lbs --> 157 lbs - place on lasix as per home regimen   Pleural effusions, anasarca - improving, weight trending down as outlined above, weight pending this AM - hold Lasix today and may need to give extra dose in AM if weight not trending down  - continue to monitor daily weights, strict I/O  Leukocytosis - from leukopenia to leukocytosis in 24 hours: 2.8 --> 11.7 --> 8.3 --> 11 --> 14 --> 20 K - suspect SIRS due to cellulitis and bacteremia as outlined above - ABX as noted above  Moderate protein calorie malnutrition  - nutritionist consulted - pt tolerating diet well    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 - pain controlled on chronic home regimen with methadone   Anemia of acute illness - with some post op bleed contributing as well - HG slightly down this AM, if < 7, will plan of transfusing PRBC - CBC in AM  Thrombocytosis, essential  - reactive  - CBC in AM  Hypokalemia and hypomagnesemia - supplemented and both electrolytes at target range  - BMP and Mg level in AM  N/V - resolved  - allow zofran as needed   Hyponatremia - continue trending down, unclear why  - will need to monitor closely  - BMP in AM  Urinary retention - foley taken out but pt with 700 cc in bladder after 8 hours of foley being taken out  -  per urology team, planning for L JJ stent next week  - continue with in/out cath  - appreciate urology team assistance   DVT prophylaxis:  SCD's  Code Status: Full Code  Family Communication: Husband at bedside, his questions were answered. Husband also educated on how to help his wife with spirometer.   Disposition Plan:  Home once cleared by surgery team   Consultants:   Surgery   Oncology   Urology   Procedures:   Colonic resection  Antibiotics:  Primaxin 6/13 -->  Vancomycin 6/13  -->  Subjective: Fast HR still this AM. Still with TTP at the left abd area.   Objective: Filed Vitals:   03/11/16 2102 03/12/16 0459 03/12/16 0500 03/12/16 1438  BP: 105/79 124/86  116/66  Pulse: 118 116  117  Temp: 98.9 F (37.2 C) 98.4 F (36.9 C)  98.2 F (36.8 C)  TempSrc: Oral Oral  Oral  Resp: 20 20  21   Height:      Weight:   71.4 kg (157 lb 6.5 oz)   SpO2: 93% 93%  95%    Intake/Output Summary (Last 24 hours) at 03/12/16 1441 Last data filed at 03/12/16 1234  Gross per 24 hour  Intake    315 ml  Output   1000 ml  Net   -685 ml   Filed Weights   03/10/16 2136 03/11/16 0500 03/12/16 0500  Weight: 69.6 kg (153 lb 7 oz) 72.2 kg (159 lb 2.8 oz) 71.4 kg (157 lb 6.5 oz)    Examination:  General exam: more awake and interactive today Respiratory system: mild tachypnea with diminished breath sounds at bases and crackles  Cardiovascular system: S1 & S2 heard, tachy-sinus Gastrointestinal system: non tender, open site looks ok, bit dry, at the umbilical level and outside the wounds border, area of erythema and TTP and warmth to touch that extends toward left flank, less red but still TTP Central nervous system: Alert and oriented. No focal neurological deficits. Extremities: Lower and upper extremity edema still present but overall improving   Data Reviewed: I have personally reviewed following labs and imaging studies  CBC:  Recent Labs Lab 03/08/16 0252 03/09/16 0504 03/10/16 0406 03/11/16 0241 03/12/16 0340  WBC 6.8 8.3 11.4* 14.3* 20.9*  HGB 9.1* 9.0* 8.7* 7.7* 7.3*  HCT 28.7* 27.9* 26.8* 23.5* 23.1*  MCV 67.1* 66.4* 65.5* 65.6* 66.6*  PLT 312 320 324 354 [id number]*   Basic Metabolic Panel:  Recent Labs Lab 03/08/16 0252 03/09/16 0504 03/10/16 0406 03/11/16 0241 03/12/16 0340  NA 133* 130* 130* 127* 126*  K 2.7* 2.8* 3.7 4.7 3.9  CL 103 99* 98* 98* 96*  CO2 23 23 26 24 26   GLUCOSE 95 71 99 79 99  BUN 7 7 8  5* 6  CREATININE 0.46 0.40* 0.38* 0.39*  0.41*  CALCIUM 7.8* 7.8* 7.6* 7.7* 7.5*  MG  --  1.5* 1.8 1.8  --    CBG:  Recent Labs Lab 03/08/16 0722 03/09/16 0742 03/09/16 0852 03/10/16 0722 03/11/16 0746  GLUCAP 92 66 101* 95 93   Urine analysis:    Component Value Date/Time   COLORURINE AMBER* 03/06/2016 0157   APPEARANCEUR CLEAR 03/06/2016 0157   LABSPEC 1.019 03/06/2016 0157   PHURINE 5.5 03/06/2016 0157   GLUCOSEU 100* 03/06/2016 0157   HGBUR SMALL* 03/06/2016 0157   BILIRUBINUR NEGATIVE 03/06/2016 0157   KETONESUR NEGATIVE 03/06/2016 0157   PROTEINUR 30* 03/06/2016 0157   UROBILINOGEN 1.0 12/18/2012 1754   NITRITE NEGATIVE  03/06/2016 0157   LEUKOCYTESUR NEGATIVE 03/06/2016 0157   Recent Results (from the past 240 hour(s))  Surgical PCR screen     Status: None   Collection Time: 03/03/16  4:00 AM  Result Value Ref Range Status   MRSA, PCR NEGATIVE NEGATIVE Final   Staphylococcus aureus NEGATIVE NEGATIVE Final    Anti-infectives    Start     Dose/Rate Route Frequency Ordered Stop   03/12/16 0400  vancomycin (VANCOCIN) IVPB 1000 mg/200 mL premix     1,000 mg 200 mL/hr over 60 Minutes Intravenous Every 12 hours 03/11/16 1513     03/11/16 1600  vancomycin (VANCOCIN) 1,500 mg in sodium chloride 0.9 % 500 mL IVPB     1,500 mg 250 mL/hr over 120 Minutes Intravenous  Once 03/11/16 1513 03/11/16 2041   03/11/16 0900  imipenem-cilastatin (PRIMAXIN) 500 mg in sodium chloride 0.9 % 100 mL IVPB     500 mg 200 mL/hr over 30 Minutes Intravenous Every 6 hours 03/11/16 0738     03/10/16 1400  clindamycin (CLEOCIN) IVPB 600 mg  Status:  Discontinued     600 mg 100 mL/hr over 30 Minutes Intravenous Every 8 hours 03/10/16 1104 03/11/16 0738   03/10/16 1145  cefTRIAXone (ROCEPHIN) 1 g in dextrose 5 % 50 mL IVPB  Status:  Discontinued     1 g 100 mL/hr over 30 Minutes Intravenous Every 24 hours 03/10/16 1138 03/10/16 1144   03/03/16 0846  cefoTEtan in Dextrose 5% (CEFOTAN) 2-2.08 GM-% IVPB    Comments:  Forte, Lindsi    : cabinet override      03/03/16 0846 03/03/16 2059   03/03/16 0600  cefoTEtan (CEFOTAN) 2 g in dextrose 5 % 50 mL IVPB    Comments:  In OR holding pre op   2 g 100 mL/hr over 30 Minutes Intravenous On call to O.R. 03/02/16 1054 03/03/16 1009   03/02/16 1000  cefoTEtan (CEFOTAN) 2 g in dextrose 5 % 50 mL IVPB  Status:  Discontinued    Comments:  In OR holding pre op   2 g 100 mL/hr over 30 Minutes Intravenous Every 12 hours 03/02/16 0959 03/02/16 1054     Radiology Studies: Ct Abdomen Pelvis W Contrast  03/10/2016  CLINICAL DATA:  Pt with follow-up on blister, infected, abdominal wall, mass of colon. EXAM: CT ABDOMEN AND PELVIS WITH CONTRAST TECHNIQUE: Multidetector CT imaging of the abdomen and pelvis was performed using the standard protocol following bolus administration of intravenous contrast. CONTRAST:  100 ml ISOVUE-300 IOPAMIDOL (ISOVUE-300) INJECTION 61% COMPARISON:  02/29/2016 FINDINGS: Since the prior exam, patient has undergone surgery with formation of a left lower quadrant colostomy decompressing the bowel obstruction caused by the apparent distal colonic mass. Subcutaneous air surrounds the colostomy. The skin and subcutaneous soft tissues along the anterior abdominal wall incision remains open. The fascia is closed. There are air-fluid levels within the stomach, small bowel and colon consistent with a postoperative adynamic ileus. There is no significant bowel distention. Diffuse edema is seen throughout subcutaneous soft tissues, most evident along the flanks, new since the prior exam. There is no defined fluid collection within the abdomen or the abdominal wall suggest an abscess. Lung bases: Small effusions, right greater than left, with associated lung base atelectasis. Stable hiatal hernia. Hepatobiliary: Subtle liver lesions noted on the prior study are not resolved on this unenhanced scan. Liver unremarkable. Gallbladder is unremarkable. No bile duct dilation. Spleen, pancreas,  adrenal glands:  Unremarkable. Kidneys, ureters, bladder: No  renal mass or stone. No hydronephrosis. Ureters not well-defined. Bladder with nondependent air likely from recent instrumentation. Peritoneal cavity: Small amount ascites. Minimal free air likely postoperative in origin. Lymph nodes: No definitive enlarged lymph nodes. Small nodules in the pelvis noted on the prior study are less well-defined. Musculoskeletal: Stable postsurgical changes from L5-S1 fusion. No osteoblastic or osteolytic lesions. IMPRESSION: 1. Since the prior exam, the patient has undergone abdominal surgery with formation of a left lower quadrant colostomy. There is subcutaneous air surrounding the colostomy. There is no significant residual bowel obstruction. Air-fluid levels within the bowel is consistent with a postoperative adynamic ileus. There is also diffuse subcutaneous edema which is new. The skin and subcutaneous soft tissues of the anterior abdominal wall incision are open. The fascia is closed. 2. There is a small amount of ascites, which has increased from the prior exam. 3. Small pleural effusions with lung base atelectasis is new from the prior study. 4. There is no defined fluid collection to suggest an abscess. Electronically Signed   By: Lajean Manes M.D.   On: 03/10/2016 21:00    Scheduled Meds: . antiseptic oral rinse  7 mL Mouth Rinse q12n4p  . chlorhexidine  15 mL Mouth Rinse BID  . DULoxetine  60 mg Oral Daily  . enoxaparin (LOVENOX) injection  40 mg Subcutaneous Daily  . imipenem-cilastatin  500 mg Intravenous Q6H  . methadone  10 mg Oral TID  . sodium chloride flush  3 mL Intravenous Q12H  . tamsulosin  0.4 mg Oral Daily  . vancomycin  1,000 mg Intravenous Q12H   Continuous Infusions:     LOS: 11 days    Time spent: 35 min  Faye Ramsay, DO [facility] Pager [phone]  If 7PM-7AM, please contact night-coverage [email or web address] Password [facility] 03/12/2016, 2:41 PM

## 2016-03-12 NOTE — Care Management Note (Signed)
Case Management Note  Patient Details  Name: Carolyn Barton MRN: [id number] Date of Birth: April 08, 1956  Subjective/Objective:                    Action/Plan:  Spoke with patient and husband at bedside . Offered choice , they want [facility] . Patient's husband willing to learn dressing change and ostomy , Foley care . Patient already has bedside commode and walker at home  Expected Discharge Date:                  Expected Discharge Plan:  [place]  In-House Referral:  NA  Discharge planning Services  CM Consult  Post Acute Care Choice:  Durable Medical Equipment, Home Health (New colostomy) Choice offered to:  Patient  DME Arranged:    DME Agency:     HH Arranged:  RN, PT [place] Agency:  [place]  Status of Service:  Completed, signed off  Medicare Important Message Given:    Date Medicare IM Given:    Medicare IM give by:    Date Additional Medicare IM Given:    Additional Medicare Important Message give by:     If discussed at [place] of Stay Meetings, dates discussed:    Additional Comments:  Marilu Favre, RN 03/12/2016, 10:10 AM

## 2016-03-12 NOTE — Progress Notes (Signed)
Nutrition Follow-up  DOCUMENTATION CODES:   Non-severe (moderate) malnutrition in context of chronic illness  INTERVENTION:   -D/c Ensure Enlive po TID, each supplement provides 350 kcal and 20 grams of protein, due to poor acceptance -Magic Cup TID with meals -Snacks (yogurt) TID  NUTRITION DIAGNOSIS:   Malnutrition related to chronic illness as evidenced by percent weight loss, moderate depletions of muscle mass (7.9% x 3 mo).  Ongoing  GOAL:   Patient will meet greater than or equal to 90% of their needs  Progressing  MONITOR:   PO intake, Supplement acceptance, Labs, Weight trends, Skin, I & O's, Diet advancement  REASON FOR ASSESSMENT:   Malnutrition Screening Tool    ASSESSMENT:   Carolyn Barton is a 60 y.o. female with medical history significant of chronic back pain, thrombocytosis, depression, tachycardia, who presents with nausea, vomiting, abdominal pain and constipation.  S/p procedures on 03/03/16: exploratory laparotomy, descending colostomy, mucus fistula, repair of chronic serosal tears  Pt transferred from SDU to medical floor on 03/11/16.   Pt was advanced to a regular diet on 03/09/16. Spoke with pt and pt husband at bedside. Pt husband reports that pt has overall made a lot of progress since admission. Pt reports that her appetite has improved greatly over the past 3-4 days, particularly due to expanded choices with regular food. Meal completion 20-50%. Pt acknowledges that she has Ensure ordered, but does not like the supplement. Offered other options on formulary; pt reports she has tried [employer], but dislikes them both ("I'm sorry, but I just can't take them"). Pt amenable to yogurt between meals and Magic Cup to promote nutritional adequacy.   Discussed with pt importance of good meal intake for healing. Discussed specific ways in which pt could increase protein sources in her diet (including meats, cheese, milk, ice cream, yogurt,  pudding, etc). Pt very grateful for visit and information given by RD.   Labs reviewed: Na: 127.   Diet Order:  Diet regular Room service appropriate?: Yes; Fluid consistency:: Thin  Skin:  Wound (see comment) (closed abdominal incision)  Last BM:  03/12/16  Height:   Ht Readings from Last 1 Encounters:  03/07/16 5\' 5"  (1.651 m)    Weight:   Wt Readings from Last 1 Encounters:  03/12/16 157 lb 6.5 oz (71.4 kg)    Ideal Body Weight:  56.8 kg  BMI:  Body mass index is 26.19 kg/(m^2).  Estimated Nutritional Needs:   Kcal:  1800-2000  Protein:  90-105 grams  Fluid:  1.8-2.0 L  EDUCATION NEEDS:   No education needs identified at this time  Nurah Petrides A. Jimmye Norman, RD, LDN, CDE Pager: [phone] After hours Pager: [phone]

## 2016-03-13 LAB — CBC
HCT: 22.7 % — ABNORMAL LOW (ref 36.0–46.0)
HEMOGLOBIN: 7.4 g/dL — AB (ref 12.0–15.0)
MCH: 21.1 pg — ABNORMAL LOW (ref 26.0–34.0)
MCHC: 32.6 g/dL (ref 30.0–36.0)
MCV: 64.9 fL — ABNORMAL LOW (ref 78.0–100.0)
PLATELETS: 545 10*3/uL — AB (ref 150–400)
RBC: 3.5 MIL/uL — ABNORMAL LOW (ref 3.87–5.11)
RDW: 16.5 % — AB (ref 11.5–15.5)
WBC: 18.9 10*3/uL — ABNORMAL HIGH (ref 4.0–10.5)

## 2016-03-13 LAB — GLUCOSE, CAPILLARY: Glucose-Capillary: 90 mg/dL (ref 65–99)

## 2016-03-13 LAB — BASIC METABOLIC PANEL
ANION GAP: 5 (ref 5–15)
BUN: <5 mg/dL — ABNORMAL LOW (ref 6–20)
CHLORIDE: 96 mmol/L — AB (ref 101–111)
CO2: 28 mmol/L (ref 22–32)
Calcium: 7.5 mg/dL — ABNORMAL LOW (ref 8.9–10.3)
Creatinine, Ser: 0.36 mg/dL — ABNORMAL LOW (ref 0.44–1.00)
GFR calc non Af Amer: >60 mL/min (ref >60)
Glucose, Bld: 70 mg/dL (ref 65–99)
Potassium: 3.6 mmol/L (ref 3.5–5.1)
Sodium: 129 mmol/L — ABNORMAL LOW (ref 135–145)

## 2016-03-13 LAB — CULTURE, BLOOD (ROUTINE X 2)

## 2016-03-13 MED ORDER — TRAMADOL HCL 50 MG PO TABS
50.0000 mg | ORAL_TABLET | Freq: Four times a day (QID) | ORAL | Status: DC | PRN
  Administered 2016-03-14 – 2016-03-15 (×3): 50 mg via ORAL
  Filled 2016-03-13 (×3): qty 1

## 2016-03-13 MED ORDER — FUROSEMIDE 20 MG PO TABS
20.0000 mg | ORAL_TABLET | Freq: Every day | ORAL | Status: DC
  Administered 2016-03-13 – 2016-03-15 (×3): 20 mg via ORAL
  Filled 2016-03-13 (×3): qty 1

## 2016-03-13 NOTE — Progress Notes (Addendum)
PROGRESS NOTE    Carolyn Barton  [id number] DOB: 08-22-1956 DOA: 02/29/2016   PCP: Marton Redwood, MD   Brief Narrative:  60 y.o. female with chronic back pain, thrombocytosis, depression, tachycardia, who presented with nausea, vomiting, abdominal pain and constipation. Imaging studies notable for colonic mass in distal sigmoid colon, measuring 6.6 x 3.2 x 3.8 cm, resulting in partial obstruction, compatible with primary colonic malignancy, small adjacent nodules, concerning for local spread of disease. Pt is now S/p resection on 6/5.  Major events since admission: 6/12 - worsening cellulitis at the abd area, started Clindamycin, blood cultures obtained  6/13 - blood cultures 2/2 G+ cocci in clusters, ABX changed to Primaxin and Vancomycin, pt Ok to transfer out of SDU  6/14 - pt reports feeling better  Assessment & Plan:   Colonic mass, Metastatic Ca. of the rectosigmoid colon  - Obstructing large bowel mass, likely adenocarcinoma with metastasis to liver  - POD# 10 exploratory laparotomy, descending colostomy, mucus fistula by Dr. Brantley Stage 03/03/16 - diet advanced to regular and pt so far tolerating well  - encourage OOB to chair and ambulating with assistance, pt has been doing well from that standpoint and will need PT once d/c - also encouraged use of spirometer while awake, educated in proper use of spirometer and importance of its use in overall recovery - appreciate surgery team following   Sepsis secondary to Cellulitis 6/12, bacteremia with g+ cocci in clusters (preliminar report) - erythema and TTP extending from the outside the wounds site at the umbilical level and towards the left flank area, looks better  - WBC trend: 14 --> 20 --> 18.9 K, no fevers overnight but still tachy but better overall  - continue Primaxin and Vancomycin, started June 13th, 2017 - will need at least two weeks ABX total or longer if cellulitis still present  - once WBC trending down and pt no  longer tach, suspect can change to PO ABX   Hypotension/tachycardia - appears to be reactive and I suspect that extra volume also contributing to tachycardia as well as now cellulitis as outlined above  - ABX as noted above  - gave one dose of Lasix 20 mg IV 6/10 and pt's weight is down from 162 --> 159 lbs --> 157 lbs - started home regimen with Lasix 20 mg PO QD  Pleural effusions, anasarca - improving, weight trending down as outlined above - resume home lasix 20 mg PO QD 6/15 (please note pt has gotten several dose of lasix 20 mg IV in the past week and has responded well but her K and Mg drop significantly with even low dose) - continue to monitor daily weights, strict I/O  Moderate protein calorie malnutrition  - nutritionist consulted - pt tolerating diet well    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 - pain controlled on chronic home regimen with methadone   Anemia of acute illness - with some post op bleed contributing as well - HG slightly down this AM, if < 7, will plan of transfusing PRBC - CBC in AM  Thrombocytosis, essential  - reactive  - CBC in AM  Hypokalemia and hypomagnesemia - supplemented and both electrolytes at target range  - BMP and Mg level in AM  N/V - resolved  - allow zofran as needed   Hyponatremia - improving from 126 --> 129 this AM  - will need to monitor closely  - BMP in AM  Urinary retention - foley taken out but pt with  700 cc in bladder after 8 hours of foley being taken out  - per urology team, planning for L JJ stent next week (? Tuesday and can be done here if pt still hospitalized) - continue with in/out cath  - appreciate urology team assistance   DVT prophylaxis:  Lovenox SQ  Code Status: Full Code  Family Communication: Husband at bedside, his questions were answered.   Disposition Plan:  Home once cleared by surgery team   Consultants:   Surgery   Oncology   Urology   Procedures:   Colonic  resection  Antibiotics:  Primaxin 6/13 -->  Vancomycin 6/13 -->  Subjective: Fast HR still this AM. Less tender at the abd area.   Objective: Filed Vitals:   03/12/16 2127 03/13/16 0447 03/13/16 0535 03/13/16 1318  BP: 116/69  120/63 130/67  Pulse: 111  102 108  Temp: 98.6 F (37 C)  98.6 F (37 C) 98.2 F (36.8 C)  TempSrc: Oral  Oral Oral  Resp: 20  20 18   Height:      Weight:  72 kg (158 lb 11.7 oz)    SpO2: 94%  93% 95%    Intake/Output Summary (Last 24 hours) at 03/13/16 1920 Last data filed at 03/13/16 1907  Gross per 24 hour  Intake    720 ml  Output   2550 ml  Net  -1830 ml   Filed Weights   03/11/16 0500 03/12/16 0500 03/13/16 0447  Weight: 72.2 kg (159 lb 2.8 oz) 71.4 kg (157 lb 6.5 oz) 72 kg (158 lb 11.7 oz)    Examination:  General exam: more awake and interactive today Respiratory system: mild tachypnea with diminished breath sounds at bases and crackles  Cardiovascular system: S1 & S2 heard, tachy-sinus Gastrointestinal system: non tender, open site looks ok, at the umbilical level and outside the wounds border, area of erythema and TTP and warmth to touch that extends toward left flank, less red and appears like its getting smaller, still somewhat TTP  Central nervous system: Alert and oriented. No focal neurological deficits. Extremities: Lower and upper extremity edema still present  Data Reviewed: I have personally reviewed following labs and imaging studies  CBC:  Recent Labs Lab 03/09/16 0504 03/10/16 0406 03/11/16 0241 03/12/16 0340 03/13/16 0409  WBC 8.3 11.4* 14.3* 20.9* 18.9*  HGB 9.0* 8.7* 7.7* 7.3* 7.4*  HCT 27.9* 26.8* 23.5* 23.1* 22.7*  MCV 66.4* 65.5* 65.6* 66.6* 64.9*  PLT 320 324 354 455* [id number]*   Basic Metabolic Panel:  Recent Labs Lab 03/09/16 0504 03/10/16 0406 03/11/16 0241 03/12/16 0340 03/13/16 0409  NA 130* 130* 127* 126* 129*  K 2.8* 3.7 4.7 3.9 3.6  CL 99* 98* 98* 96* 96*  CO2 23 26 24 26 28   GLUCOSE  71 99 79 99 70  BUN 7 8 5* 6 <5*  CREATININE 0.40* 0.38* 0.39* 0.41* 0.36*  CALCIUM 7.8* 7.6* 7.7* 7.5* 7.5*  MG 1.5* 1.8 1.8  --   --    CBG:  Recent Labs Lab 03/09/16 0742 03/09/16 0852 03/10/16 0722 03/11/16 0746 03/13/16 0816  GLUCAP 66 101* 95 93 90   Urine analysis:    Component Value Date/Time   COLORURINE AMBER* 03/06/2016 0157   APPEARANCEUR CLEAR 03/06/2016 0157   LABSPEC 1.019 03/06/2016 0157   PHURINE 5.5 03/06/2016 0157   GLUCOSEU 100* 03/06/2016 0157   HGBUR SMALL* 03/06/2016 0157   BILIRUBINUR NEGATIVE 03/06/2016 0157   KETONESUR NEGATIVE 03/06/2016 0157   PROTEINUR 30*  03/06/2016 0157   UROBILINOGEN 1.0 12/18/2012 1754   NITRITE NEGATIVE 03/06/2016 0157   LEUKOCYTESUR NEGATIVE 03/06/2016 0157   Recent Results (from the past 240 hour(s))  Surgical PCR screen     Status: None   Collection Time: 03/03/16  4:00 AM  Result Value Ref Range Status   MRSA, PCR NEGATIVE NEGATIVE Final   Staphylococcus aureus NEGATIVE NEGATIVE Final    Anti-infectives    Start     Dose/Rate Route Frequency Ordered Stop   03/12/16 0400  vancomycin (VANCOCIN) IVPB 1000 mg/200 mL premix     1,000 mg 200 mL/hr over 60 Minutes Intravenous Every 12 hours 03/11/16 1513     03/11/16 1600  vancomycin (VANCOCIN) 1,500 mg in sodium chloride 0.9 % 500 mL IVPB     1,500 mg 250 mL/hr over 120 Minutes Intravenous  Once 03/11/16 1513 03/11/16 2041   03/11/16 0900  imipenem-cilastatin (PRIMAXIN) 500 mg in sodium chloride 0.9 % 100 mL IVPB     500 mg 200 mL/hr over 30 Minutes Intravenous Every 6 hours 03/11/16 0738     03/10/16 1400  clindamycin (CLEOCIN) IVPB 600 mg  Status:  Discontinued     600 mg 100 mL/hr over 30 Minutes Intravenous Every 8 hours 03/10/16 1104 03/11/16 0738   03/10/16 1145  cefTRIAXone (ROCEPHIN) 1 g in dextrose 5 % 50 mL IVPB  Status:  Discontinued     1 g 100 mL/hr over 30 Minutes Intravenous Every 24 hours 03/10/16 1138 03/10/16 1144   03/03/16 0846  cefoTEtan in  Dextrose 5% (CEFOTAN) 2-2.08 GM-% IVPB    Comments:  Forte, Lindsi   : cabinet override      03/03/16 0846 03/03/16 2059   03/03/16 0600  cefoTEtan (CEFOTAN) 2 g in dextrose 5 % 50 mL IVPB    Comments:  In OR holding pre op   2 g 100 mL/hr over 30 Minutes Intravenous On call to O.R. 03/02/16 1054 03/03/16 1009   03/02/16 1000  cefoTEtan (CEFOTAN) 2 g in dextrose 5 % 50 mL IVPB  Status:  Discontinued    Comments:  In OR holding pre op   2 g 100 mL/hr over 30 Minutes Intravenous Every 12 hours 03/02/16 0959 03/02/16 1054     Radiology Studies: No results found.  Scheduled Meds: . antiseptic oral rinse  7 mL Mouth Rinse q12n4p  . chlorhexidine  15 mL Mouth Rinse BID  . DULoxetine  60 mg Oral Daily  . enoxaparin (LOVENOX) injection  40 mg Subcutaneous Daily  . furosemide  20 mg Oral Daily  . imipenem-cilastatin  500 mg Intravenous Q6H  . methadone  10 mg Oral TID  . sodium chloride flush  3 mL Intravenous Q12H  . tamsulosin  0.4 mg Oral Daily  . vancomycin  1,000 mg Intravenous Q12H   Continuous Infusions:     LOS: 12 days    Time spent: 35 min  Faye Ramsay, DO [facility] Pager [phone]  If 7PM-7AM, please contact night-coverage [email or web address] Password [email or web address] 03/13/2016, 7:20 PM

## 2016-03-13 NOTE — Progress Notes (Signed)
Patient ID: Carolyn Barton, female   DOB: 1956/05/19, 60 y.o.   MRN: [id number]     [facility]      [address] [place], [place] [address]    Phone: [phone] FAX: [phone]     Subjective: Wbc down. Afebrile. BC with coag negative staph.  Unfortunately, pt did not ambulate yesterday. Appetite is good. Good uop with foley.   Objective:  Vital signs:  Filed Vitals:   03/12/16 1438 03/12/16 2127 03/13/16 0447 03/13/16 0535  BP: 116/66 116/69  120/63  Pulse: 117 111  102  Temp: 98.2 F (36.8 C) 98.6 F (37 C)  98.6 F (37 C)  TempSrc: Oral Oral  Oral  Resp: '21 20  20  '$ Height:      Weight:   72 kg (158 lb 11.7 oz)   SpO2: 95% 94%  93%    Last BM Date: 03/12/16  Intake/Output   Yesterday:  06/14 0701 - 06/15 0700 In: 480 [P.O.:480] Out: 1700 [Urine:1550; Stool:150] This shift:      Physical Exam: General: Pt awake/alert/oriented x4 in no acute distress  Abdomen: Soft. Nondistended. Tender around incision. Incision without drainage or odor, patches of necrosis with intact fascia. llq stoma is pink and viable. Left abdominal wall cellulitis, improved.   Problem List:   Principal Problem:   Mass of colon Active Problems:   Essential thrombocytosis (HCC)   Lumbar radiculopathy   Bowel obstruction (HCC)   Nausea & vomiting   Malnutrition of moderate degree   Cellulitis   Leukocytosis    Results:   Labs: Results for orders placed or performed during the hospital encounter of 02/29/16 (from the past 48 hour(s))  CBC     Status: Abnormal   Collection Time: 03/12/16  3:40 AM  Result Value Ref Range   WBC 20.9 (H) 4.0 - 10.5 K/uL   RBC 3.47 (L) 3.87 - 5.11 MIL/uL   Hemoglobin 7.3 (L) 12.0 - 15.0 g/dL   HCT 23.1 (L) 36.0 - 46.0 %   MCV 66.6 (L) 78.0 - 100.0 fL   MCH 21.0 (L) 26.0 - 34.0 pg   MCHC 31.6 30.0 - 36.0 g/dL   RDW 16.6 (H) 11.5 - 15.5 %   Platelets 455 (H) 150 - 400 K/uL  Basic  metabolic panel     Status: Abnormal   Collection Time: 03/12/16  3:40 AM  Result Value Ref Range   Sodium 126 (L) 135 - 145 mmol/L   Potassium 3.9 3.5 - 5.1 mmol/L    Comment: DELTA CHECK NOTED NO VISIBLE HEMOLYSIS    Chloride 96 (L) 101 - 111 mmol/L   CO2 26 22 - 32 mmol/L   Glucose, Bld 99 65 - 99 mg/dL   BUN 6 6 - 20 mg/dL   Creatinine, Ser 0.41 (L) 0.44 - 1.00 mg/dL   Calcium 7.5 (L) 8.9 - 10.3 mg/dL   GFR calc non Af Amer >60 >60 mL/min   GFR calc Af Amer >60 >60 mL/min    Comment: (NOTE) The eGFR has been calculated using the CKD EPI equation. This calculation has not been validated in all clinical situations. eGFR's persistently <60 mL/min signify possible Chronic Kidney Disease.    Anion gap 4 (L) 5 - 15  CBC     Status: Abnormal   Collection Time: 03/13/16  4:09 AM  Result Value Ref Range   WBC 18.9 (H) 4.0 - 10.5 K/uL   RBC 3.50 (L)  3.87 - 5.11 MIL/uL   Hemoglobin 7.4 (L) 12.0 - 15.0 g/dL   HCT 88.3 (L) 03.2 - 19.3 %   MCV 64.9 (L) 78.0 - 100.0 fL   MCH 21.1 (L) 26.0 - 34.0 pg   MCHC 32.6 30.0 - 36.0 g/dL   RDW 93.6 (H) 72.2 - 80.2 %   Platelets 545 (H) 150 - 400 K/uL  Basic metabolic panel     Status: Abnormal   Collection Time: 03/13/16  4:09 AM  Result Value Ref Range   Sodium 129 (L) 135 - 145 mmol/L   Potassium 3.6 3.5 - 5.1 mmol/L   Chloride 96 (L) 101 - 111 mmol/L   CO2 28 22 - 32 mmol/L   Glucose, Bld 70 65 - 99 mg/dL   BUN <5 (L) 6 - 20 mg/dL   Creatinine, Ser 1.89 (L) 0.44 - 1.00 mg/dL   Calcium 7.5 (L) 8.9 - 10.3 mg/dL   GFR calc non Af Amer >60 >60 mL/min   GFR calc Af Amer >60 >60 mL/min    Comment: (NOTE) The eGFR has been calculated using the CKD EPI equation. This calculation has not been validated in all clinical situations. eGFR's persistently <60 mL/min signify possible Chronic Kidney Disease.    Anion gap 5 5 - 15    Imaging / Studies: No results found.  Medications / Allergies:  Scheduled Meds: . antiseptic oral rinse  7 mL  Mouth Rinse q12n4p  . chlorhexidine  15 mL Mouth Rinse BID  . DULoxetine  60 mg Oral Daily  . enoxaparin (LOVENOX) injection  40 mg Subcutaneous Daily  . furosemide  20 mg Oral Daily  . imipenem-cilastatin  500 mg Intravenous Q6H  . methadone  10 mg Oral TID  . sodium chloride flush  3 mL Intravenous Q12H  . tamsulosin  0.4 mg Oral Daily  . vancomycin  1,000 mg Intravenous Q12H   Continuous Infusions:  PRN Meds:.acetaminophen **OR** acetaminophen, HYDROmorphone (DILAUDID) injection, LORazepam, methocarbamol (ROBAXIN)  IV, metoprolol, oxyCODONE, traMADol  Antibiotics: Anti-infectives    Start     Dose/Rate Route Frequency Ordered Stop   03/12/16 0400  vancomycin (VANCOCIN) IVPB 1000 mg/200 mL premix     1,000 mg 200 mL/hr over 60 Minutes Intravenous Every 12 hours 03/11/16 1513     03/11/16 1600  vancomycin (VANCOCIN) 1,500 mg in sodium chloride 0.9 % 500 mL IVPB     1,500 mg 250 mL/hr over 120 Minutes Intravenous  Once 03/11/16 1513 03/11/16 2041   03/11/16 0900  imipenem-cilastatin (PRIMAXIN) 500 mg in sodium chloride 0.9 % 100 mL IVPB     500 mg 200 mL/hr over 30 Minutes Intravenous Every 6 hours 03/11/16 0738     03/10/16 1400  clindamycin (CLEOCIN) IVPB 600 mg  Status:  Discontinued     600 mg 100 mL/hr over 30 Minutes Intravenous Every 8 hours 03/10/16 1104 03/11/16 0738   03/10/16 1145  cefTRIAXone (ROCEPHIN) 1 g in dextrose 5 % 50 mL IVPB  Status:  Discontinued     1 g 100 mL/hr over 30 Minutes Intravenous Every 24 hours 03/10/16 1138 03/10/16 1144   03/03/16 0846  cefoTEtan in Dextrose 5% (CEFOTAN) 2-2.08 GM-% IVPB    Comments:  Carolyn Barton, Carolyn Barton   : cabinet override      03/03/16 0846 03/03/16 2059   03/03/16 0600  cefoTEtan (CEFOTAN) 2 g in dextrose 5 % 50 mL IVPB    Comments:  In OR holding pre op   2 g  100 mL/hr over 30 Minutes Intravenous On call to O.R. 03/02/16 1054 03/03/16 1009   03/02/16 1000  cefoTEtan (CEFOTAN) 2 g in dextrose 5 % 50 mL IVPB  Status:   Discontinued    Comments:  In OR holding pre op   2 g 100 mL/hr over 30 Minutes Intravenous Every 12 hours 03/02/16 0959 03/02/16 1054         Assessment/Plan Metastatic adenocarcinoma of the colon(Dr. Benay Spice)  POD#10 exploratory laparotomy, descending colostomy, mucus fistula, repair of chronic serosal tears---Dr. Brantley Stage 03/03/16 -regular diet, appetite improving -Continue with methadone, prn oxy -mobilize, IS -sBID wet to dry dressing changes ID-coag negative staph, bacteremia +abdominal wall cellulitis, Vanc and primaxin. cellulitis is improving Left abdominal wall cellulitis-can change to PO antibiotics, but will leave up to primary team given bacteremia  Urinary retention-foley once again replaced, management per urology. Plans for JJ stents next tuesday(Dr. 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 appetite has greatly improved VTE prophylaxis-scd, lovenox Dispo-surgically stable for discharge.  Will need to coordinate with urology about placing a port in at the same time as the stent if the patient is discharged prior to Tuesday.   Erby Pian, [facility] Surgery Pager [phone]) For consults and floor pages call [phone](7A-4:30P)  03/13/2016 8:17 AM

## 2016-03-13 NOTE — Progress Notes (Signed)
Physical Therapy Treatment Patient Details Name: Carolyn Barton MRN: [id number] DOB: 31-May-1956 Today's Date: 03/13/2016    History of Present Illness Carolyn Barton is a 60 y.o. female with PMH significant for lumbar radiculopathy, scoliosis, depression, thrombocytosis, and tachycardia who presented to the ED with c/o abdominal pain with N/V and constipation x 1 week. CT scan revealed a colonic mass at the distal sigmoid colon resulting in partial obstruction compatible with primary colonic malignancy, small adjacent nodules concerning for local spread of disease, non specific hypodensities in the liver, and no hydroureteronephrosis. WBC 11, Cr 0.56 on admission. She is s/p colon resection with colostomy 03/03/16 by Dr. Brantley Stage. During the procedure he noted that the cancer was densely adherent to the left pelvic sidewall where the ureter entered. The two structures could not be easily separated. He documented in his op note that he felt she would likely require a left ureteral stent and chemotherapy due to multiple metastatic sites. Final path is pending but it appears to be adenocarcinoma. Oncology is evaluating the pt.     PT Comments    Pt performed increased gait distance.  Pt remains to require mod assist for bed mobility and transfers but more motivated to assist with efforts.  Will f/u to perform stair training next visit.  Husband is supportive and able to assist at home.     Follow Up Recommendations  Home health PT;Supervision/Assistance - 24 hour     Equipment Recommendations  Rolling walker with 5" wheels;3in1 (PT)    Recommendations for Other Services       Precautions / Restrictions Precautions Precautions: Fall Precaution Comments: weak and unsteady on her feet Restrictions Weight Bearing Restrictions: No    Mobility  Bed Mobility Overal bed mobility: Needs Assistance Bed Mobility: Rolling;Sidelying to Sit Rolling: Min assist Sidelying to sit: Mod assist  (assist for upper trunk elevation into sitting. )   Sit to supine: Mod assist (assist to lift B LEs into bed.)   General bed mobility comments: Pt apprehensive to perform without assistance due to pain and fear.  Pt able to advance LEs to edge of bed un assisted.  Pt required assist for trunk and LEs back into bed.    Transfers Overall transfer level: Needs assistance Equipment used: Rolling walker (2 wheeled) Transfers: Sit to/from Stand Sit to Stand: Mod assist         General transfer comment: Pt unsteady during forward weight shifting and pushing with B LEs.  Pt required cues for upper trunk control and assist to boost and elevate.    Ambulation/Gait Ambulation/Gait assistance: Min assist Ambulation Distance (Feet): 300 Feet Assistive device: Rolling walker (2 wheeled)   Gait velocity: decreased Gait velocity interpretation: Below normal speed for age/gender General Gait Details: Pt with slow, shuffling gait pattern, flexed trunk posture and reliance on RW for balance and support.  Required rest period in standing x3.  Cues for upper trunk control and to increased B stride length.     Stairs Stairs:  (unable to perform as stair well closed on unit will assess next visit.  )          Wheelchair Mobility    Modified Rankin (Stroke Patients Only)       Balance     Sitting balance-Leahy Scale: Fair       Standing balance-Leahy Scale: Poor                      Cognition Arousal/Alertness: Awake/alert  Behavior During Therapy: WFL for tasks assessed/performed Overall Cognitive Status: Impaired/Different from baseline Area of Impairment: Memory     Memory: Decreased short-term memory              Exercises      General Comments        Pertinent Vitals/Pain Pain Assessment: 0-10 Faces Pain Scale: Hurts little more Pain Location: abdomen Pain Descriptors / Indicators: Aching;Discomfort;Grimacing;Guarding Pain Intervention(s): Monitored  during session;Repositioned    Home Living                      Prior Function            PT Goals (current goals can now be found in the care plan section) Acute Rehab PT Goals Patient Stated Goal: to go home Potential to Achieve Goals: Good Progress towards PT goals: Progressing toward goals    Frequency  Min 3X/week    PT Plan Current plan remains appropriate    Co-evaluation             End of Session Equipment Utilized During Treatment:  (did not use gait belt secondary  to abdominal incision.  ) Activity Tolerance: Patient limited by pain Patient left: in chair;with call bell/phone within reach;with family/visitor present     Time: 1003-1020 PT Time Calculation (min) (ACUTE ONLY): 17 min  Charges:  $Gait Training: 8-22 mins                    G Codes:      Cristela Blue 2016/03/26, 10:28 AM  Governor Rooks, PTA pager [phone]

## 2016-03-14 ENCOUNTER — Other Ambulatory Visit: Payer: Self-pay | Admitting: Urology

## 2016-03-14 ENCOUNTER — Ambulatory Visit: Payer: Self-pay | Admitting: General Surgery

## 2016-03-14 DIAGNOSIS — K5669 Other intestinal obstruction: Secondary | ICD-10-CM

## 2016-03-14 DIAGNOSIS — R1084 Generalized abdominal pain: Secondary | ICD-10-CM

## 2016-03-14 DIAGNOSIS — L03311 Cellulitis of abdominal wall: Secondary | ICD-10-CM

## 2016-03-14 DIAGNOSIS — E440 Moderate protein-calorie malnutrition: Secondary | ICD-10-CM

## 2016-03-14 DIAGNOSIS — G43A1 Cyclical vomiting, intractable: Secondary | ICD-10-CM

## 2016-03-14 DIAGNOSIS — K6389 Other specified diseases of intestine: Secondary | ICD-10-CM

## 2016-03-14 DIAGNOSIS — D72829 Elevated white blood cell count, unspecified: Secondary | ICD-10-CM

## 2016-03-14 LAB — BASIC METABOLIC PANEL
Anion gap: 8 (ref 5–15)
CHLORIDE: 95 mmol/L — AB (ref 101–111)
CO2: 28 mmol/L (ref 22–32)
Calcium: 7.6 mg/dL — ABNORMAL LOW (ref 8.9–10.3)
Creatinine, Ser: 0.36 mg/dL — ABNORMAL LOW (ref 0.44–1.00)
Glucose, Bld: 82 mg/dL (ref 65–99)
POTASSIUM: 3.9 mmol/L (ref 3.5–5.1)
SODIUM: 131 mmol/L — AB (ref 135–145)

## 2016-03-14 LAB — CBC
HCT: 23.7 % — ABNORMAL LOW (ref 36.0–46.0)
HEMOGLOBIN: 7.5 g/dL — AB (ref 12.0–15.0)
MCH: 20.4 pg — AB (ref 26.0–34.0)
MCHC: 31.6 g/dL (ref 30.0–36.0)
MCV: 64.6 fL — ABNORMAL LOW (ref 78.0–100.0)
PLATELETS: 707 10*3/uL — AB (ref 150–400)
RBC: 3.67 MIL/uL — AB (ref 3.87–5.11)
RDW: 16.6 % — ABNORMAL HIGH (ref 11.5–15.5)
WBC: 17.2 10*3/uL — AB (ref 4.0–10.5)

## 2016-03-14 LAB — GLUCOSE, CAPILLARY: GLUCOSE-CAPILLARY: 96 mg/dL (ref 65–99)

## 2016-03-14 NOTE — Progress Notes (Signed)
Assessment:  Case discussed with Dr. Benay Spice and pt this AM. She will need IV antibiotics for near future, putting off her port line insertion for (approx) 2 weeks.  I am trying to co-ordinate placement of Left JJ stent with GS placement of her port. I will be away June 24-30, and over July 4.   Plan:  If combination surgery with GS, then we will need particular JJ stent from [place] ( 56F, 24cm Polaris) and C-arm for placement at [facility], with 0.038 Guidewire. I will advise my posting scheduler in office to be aware of combination case.  Urology will follow for discharge, and coordinate with GS for surgical planning.     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 Pt will need f/u chemotherapy, and, while she has no hydronephrosis pre-op, is high risk for hydronephrosis 2ndary to external ureteral obstruction from her tumor-which would limit her chemotherapy dosage. Therefore, in consultation with GS ( Dr.Coronett) and Oncology ( Dr. Benay Spice), Urology asked to place Left JJ stent. Hospital course complicated by cellulitis ( gm +m cocci).   In addition, pt has had recurrent urinary retention. She had PVR 70cc last night, without sensation of fullness. Foley catheter replaced.  Now, pt growing staph in blood c/s. For port access for longer term IV antibiotic.   Objective: Vital signs in last 24 hours: Temp:  [98.2 F (36.8 C)-98.9 F (37.2 C)] 98.9 F (37.2 C) (06/16 0555) Pulse Rate:  [101-108] 101 (06/16 0555) Resp:  [17-18] 18 (06/16 0555) BP: (122-130)/(67-79) 127/79 mmHg (06/16 0555) SpO2:  [93 %-95 %] 95 % (06/16 0555) Weight:  [71.7 kg (158 lb 1.1 oz)] 71.7 kg (158 lb 1.1 oz) (06/16 0555)A  Intake/Output from previous day: 06/15 0701 - 06/16 0700 In: 480 [P.O.:480] Out: 1800 [Urine:1700;  Stool:100] Intake/Output this shift: Total I/O In: 240 [P.O.:240] Out: 250 [Urine:200; Stool:50]  Past Medical History  Diagnosis Date  . PONV (postoperative nausea and vomiting)   . Headache(784.0)     not since surgery for spinal leak  . Blood dyscrasia     thrombocytosis not on any meds  . Chronic back pain   . Anemia     History  . Essential thrombocytosis (HCC)     pt. denies this diagnosis  . Tachycardia     Physical Exam:  Lungs - Normal respiratory effort, chest expands symmetrically.  Abdomen - Soft, non-tender & non-distended.  Lab Results:  Recent Labs  03/12/16 0340 03/13/16 0409  WBC 20.9* 18.9*  HGB 7.3* 7.4*  HCT 23.1* 22.7*   BMET  Recent Labs  03/12/16 0340 03/13/16 0409  NA 126* 129*  K 3.9 3.6  CL 96* 96*  CO2 26 28  GLUCOSE 99 70  BUN 6 <5*  CREATININE 0.41* 0.36*  CALCIUM 7.5* 7.5*   No results for input(s): LABURIN in the last 72 hours. Results for orders placed or performed during the hospital encounter of 02/29/16  Surgical PCR screen     Status: None   Collection Time: 03/03/16  4:00 AM  Result Value Ref Range Status   MRSA, PCR NEGATIVE NEGATIVE Final   Staphylococcus aureus NEGATIVE NEGATIVE Final    Comment:        The Xpert SA Assay (FDA approved for NASAL specimens in patients over 76 years of age), is one component  of a comprehensive surveillance program.  Test performance has been validated by [facility] for patients greater than or equal to 61 year old. It is not intended to diagnose infection nor to guide or monitor treatment.   Urine culture     Status: None   Collection Time: 03/06/16  1:57 AM  Result Value Ref Range Status   Specimen Description URINE, CATHETERIZED  Final   Special Requests NONE  Final   Culture NO GROWTH  Final   Report Status 03/07/2016 FINAL  Final  Culture, blood (routine x 2)     Status: Abnormal   Collection Time: 03/10/16  1:45 PM  Result Value Ref Range Status   Specimen  Description BLOOD LEFT FOREARM  Final   Special Requests BOTTLES DRAWN AEROBIC AND ANAEROBIC 10CC  Final   Culture  Setup Time   Final    GRAM POSITIVE COCCI IN CLUSTERS IN BOTH AEROBIC AND ANAEROBIC BOTTLES CRITICAL RESULT CALLED TO, READ BACK BY AND VERIFIED WITH: C. STEWART, PHARM D AT 1434 ON [id number] BY Rhea Bleacher    Culture (A)  Final    STAPHYLOCOCCUS SPECIES (COAGULASE NEGATIVE) SUSCEPTIBILITIES PERFORMED ON PREVIOUS CULTURE WITHIN THE LAST 5 DAYS.    Report Status 03/13/2016 FINAL  Final  Culture, blood (routine x 2)     Status: Abnormal   Collection Time: 03/10/16  2:00 PM  Result Value Ref Range Status   Specimen Description BLOOD LEFT HAND  Final   Special Requests BOTTLES DRAWN AEROBIC AND ANAEROBIC 10CC  Final   Culture  Setup Time   Final    GRAM POSITIVE COCCI IN CLUSTERS IN BOTH AEROBIC AND ANAEROBIC BOTTLES CRITICAL RESULT CALLED TO, READ BACK BY AND VERIFIED WITH: J. Darleen Crocker D AT 1115 ON [id number] BY M. WILSON    Culture STAPHYLOCOCCUS SPECIES (COAGULASE NEGATIVE) (A)  Final   Report Status 03/13/2016 FINAL  Final   Organism ID, Bacteria STAPHYLOCOCCUS SPECIES (COAGULASE NEGATIVE)  Final      Susceptibility   Staphylococcus species (coagulase negative) - MIC*    CIPROFLOXACIN <=0.5 SENSITIVE Sensitive     ERYTHROMYCIN <=0.25 SENSITIVE Sensitive     GENTAMICIN <=0.5 SENSITIVE Sensitive     OXACILLIN >=4 RESISTANT Resistant     TETRACYCLINE <=1 SENSITIVE Sensitive     VANCOMYCIN 1 SENSITIVE Sensitive     TRIMETH/SULFA <=10 SENSITIVE Sensitive     CLINDAMYCIN <=0.25 SENSITIVE Sensitive     RIFAMPIN <=0.5 SENSITIVE Sensitive     Inducible Clindamycin NEGATIVE Sensitive     * STAPHYLOCOCCUS SPECIES (COAGULASE NEGATIVE)  Blood Culture ID Panel (Reflexed)     Status: Abnormal   Collection Time: 03/10/16  2:00 PM  Result Value Ref Range Status   Enterococcus species NOT DETECTED NOT DETECTED Final   Vancomycin resistance NOT DETECTED NOT DETECTED Final    Listeria monocytogenes NOT DETECTED NOT DETECTED Final   Staphylococcus species DETECTED (A) NOT DETECTED Final    Comment: CRITICAL RESULT CALLED TO, READ BACK BY AND VERIFIED WITH: J. Frens Pharm.D. 11:15 03/11/16 ([email or web address])    Staphylococcus aureus NOT DETECTED NOT DETECTED Final   Methicillin resistance DETECTED (A) NOT DETECTED Final    Comment: CRITICAL RESULT CALLED TO, READ BACK BY AND VERIFIED WITH: J. Frens Pharm.D. 11:15 03/11/16 (wilsonm)    Streptococcus species NOT DETECTED NOT DETECTED Final   Streptococcus agalactiae NOT DETECTED NOT DETECTED Final   Streptococcus pneumoniae NOT DETECTED NOT DETECTED Final   Streptococcus pyogenes NOT DETECTED NOT DETECTED Final  Acinetobacter baumannii NOT DETECTED NOT DETECTED Final   Enterobacteriaceae species NOT DETECTED NOT DETECTED Final   Enterobacter cloacae complex NOT DETECTED NOT DETECTED Final   Escherichia coli NOT DETECTED NOT DETECTED Final   Klebsiella oxytoca NOT DETECTED NOT DETECTED Final   Klebsiella pneumoniae NOT DETECTED NOT DETECTED Final   Proteus species NOT DETECTED NOT DETECTED Final   Serratia marcescens NOT DETECTED NOT DETECTED Final   Carbapenem resistance NOT DETECTED NOT DETECTED Final   Haemophilus influenzae NOT DETECTED NOT DETECTED Final   Neisseria meningitidis NOT DETECTED NOT DETECTED Final   Pseudomonas aeruginosa NOT DETECTED NOT DETECTED Final   Candida albicans NOT DETECTED NOT DETECTED Final   Candida glabrata NOT DETECTED NOT DETECTED Final   Candida krusei NOT DETECTED NOT DETECTED Final   Candida parapsilosis NOT DETECTED NOT DETECTED Final   Candida tropicalis NOT DETECTED NOT DETECTED Final    Studies/Results: No results found.    Anuoluwapo Mefferd I Jeramey Lanuza 03/14/2016, 7:00 AM

## 2016-03-14 NOTE — Progress Notes (Signed)
Physical Therapy Treatment Patient Details Name: Carolyn Barton MRN: [id number] DOB: [birth year] Today's Date: 03/14/2016    History of Present Illness Carolyn Barton is a 60 y.o. female with PMH significant for lumbar radiculopathy, scoliosis, depression, thrombocytosis, and tachycardia who presented to the ED with c/o abdominal pain with N/V and constipation x 1 week. CT scan revealed a colonic mass at the distal sigmoid colon resulting in partial obstruction compatible with primary colonic malignancy, small adjacent nodules concerning for local spread of disease, non specific hypodensities in the liver, and no hydroureteronephrosis. WBC 11, Cr 0.56 on admission. She is s/p colon resection with colostomy 03/03/16 by Dr. Brantley Stage. During the procedure he noted that the cancer was densely adherent to the left pelvic sidewall where the ureter entered. The two structures could not be easily separated. He documented in his op note that he felt she would likely require a left ureteral stent and chemotherapy due to multiple metastatic sites. Final path is pending but it appears to be adenocarcinoma. Oncology is evaluating the pt.     PT Comments    Pt performed with increased mobility and progression to stair training.  Pt requiring decreased assist for bed mobs, transfers and gait.  Will continue PT intervention during hospitalization.    Follow Up Recommendations  Home health PT;Supervision/Assistance - 24 hour     Equipment Recommendations  Rolling walker with 5" wheels;3in1 (PT)    Recommendations for Other Services       Precautions / Restrictions Precautions Precautions: Fall Precaution Comments: weak and unsteady on her feet Restrictions Weight Bearing Restrictions: No    Mobility  Bed Mobility Overal bed mobility: Needs Assistance       Supine to sit: Min assist (able to achieve 3/4 of the way to upright position.  Pt performed  with min assist to complete.  )      General bed mobility comments: Pt performed with decreased assistance and smoother transition.  Pt remains to c/o pain but able to tolerate transfer.    Transfers Overall transfer level: Needs assistance Equipment used: Rolling walker (2 wheeled) Transfers: Sit to/from Stand Sit to Stand: Min assist         General transfer comment: Pt required assist to boost from bed , B LEs remain weak but improved from previous session.    Ambulation/Gait Ambulation/Gait assistance: Min guard Ambulation Distance (Feet): 340 Feet Assistive device: Rolling walker (2 wheeled)   Gait velocity: decreased   General Gait Details: Pt with slow, shuffling gait pattern, flexed trunk posture and reliance on RW for balance and support.  Required rest period in standing x2.  Cues for upper trunk control and to increased B stride length.     Stairs Stairs: Yes Stairs assistance: Min assist Stair Management: One rail Left;Sideways;Step to pattern Number of Stairs: 2 General stair comments: Cues for sequencing and hand placement.    Wheelchair Mobility    Modified Rankin (Stroke Patients Only)       Balance     Sitting balance-Leahy Scale: Fair Sitting balance - Comments: could sit EOB self supported without assist     Standing balance-Leahy Scale: Fair                      Cognition Arousal/Alertness: Awake/alert Behavior During Therapy: WFL for tasks assessed/performed Overall Cognitive Status: Impaired/Different from baseline Area of Impairment: Memory     Memory: Decreased short-term memory  Exercises      General Comments        Pertinent Vitals/Pain Pain Assessment: 0-10 Pain Score: 4  Pain Location: abdomen Pain Descriptors / Indicators: Discomfort Pain Intervention(s): Monitored during session;Repositioned    Home Living                      Prior Function            PT Goals (current goals can now be found in the care plan  section) Acute Rehab PT Goals Patient Stated Goal: to go home Potential to Achieve Goals: Good Progress towards PT goals: Progressing toward goals    Frequency  Min 3X/week    PT Plan Current plan remains appropriate    Co-evaluation             End of Session Equipment Utilized During Treatment:  (did not use gait belt secondary to colostomy and abd incision.  ) Activity Tolerance: Patient limited by pain Patient left: in chair;with call bell/phone within reach;with family/visitor present     Time: [id number] PT Time Calculation (min) (ACUTE ONLY): 19 min  Charges:  $Gait Training: 8-22 mins                    G Codes:      Cristela Blue 2016-03-26, 12:40 PM  Governor Rooks, PTA pager [phone]

## 2016-03-14 NOTE — Progress Notes (Signed)
Pharmacy Antibiotic Note  Carolyn Barton is a 60 y.o. female admitted on 02/29/2016 with nausea, vomiting, abdominal pain, and constipation.  Pharmacy has been consulted for vancomycin dosing.   Also has 2/2 positive blood cultures with staphylococcus epidermidis - methicillin resistant (MRSE).  Plan: - vancomycin 1,000 mg IV q12h - VT goal 15-20 - May be transitioning to PO antibiotics tomorrow per note - Monitor repeat BCx, CBC, renal fx, clinical course  Height: 5\' 5"  (165.1 cm) Weight: 158 lb 1.1 oz (71.7 kg) IBW/kg (Calculated) : 57  Temp (24hrs), Avg:98.6 F (37 C), Min:98.3 F (36.8 C), Max:98.9 F (37.2 C)   Recent Labs Lab 03/10/16 0406 03/11/16 0241 03/12/16 0340 03/13/16 0409 03/14/16 0555  WBC 11.4* 14.3* 20.9* 18.9* 17.2*  CREATININE 0.38* 0.39* 0.41* 0.36* 0.36*    Estimated Creatinine Clearance: 75.2 mL/min (by C-G formula based on Cr of 0.36).    No Known Allergies  Antimicrobials this admission: Imipenem 6/13 >>6/16 Vancomycin 6/13 >>  Dose adjustments this admission: None  Microbiology results: 6/12 BCID: staphylococcus epidermidis - methicillin resistant (MRSE) 6/12: 2/2 CoNS 6/16 BCx: sent  Thank you for allowing pharmacy to be a part of this patient's care.  Arlissa Monteverde D. Petrona Wyeth, PharmD, BCPS Clinical Pharmacist Pager: [phone] 03/14/2016 2:12 PM

## 2016-03-14 NOTE — Progress Notes (Signed)
Patient ID: Carolyn Barton, female   DOB: [birth year], 60 y.o.   MRN: [id number]     [facility]      [address] [place], [place] [address]    Phone: [phone] FAX: [phone]     Subjective: Appetite is good. Ostomy is functioning.  More active. No n/v. Afebrile. Pain controlled with po pain meds.   Objective:  Vital signs:  Filed Vitals:   03/13/16 0535 03/13/16 1318 03/13/16 2120 03/14/16 0555  BP: 120/63 130/67 122/67 127/79  Pulse: 102 108 101 101  Temp: 98.6 F (37 C) 98.2 F (36.8 C) 98.3 F (36.8 C) 98.9 F (37.2 C)  TempSrc: Oral Oral Oral Oral  Resp: '20 18 17 18  '$ Height:      Weight:    71.7 kg (158 lb 1.1 oz)  SpO2: 93% 95% 93% 95%    Last BM Date: 03/13/16  Intake/Output   Yesterday:  06/15 0701 - 06/16 0700 In: 480 [P.O.:480] Out: 1800 [Urine:1700; Stool:100] This shift:    I/O last 3 completed shifts: In: 46 [P.O.:720] Out: 2800 [Urine:2550; Stool:250]    Physical Exam: General: Pt awake/alert/oriented x4 in no acute distress  Abdomen: Soft. Nondistended. Tender around incision. Incision without drainage or odor, patches of necrosis with intact fascia. llq stoma is pink and viable. Left abdominal wall cellulitis, improved.     Problem List:   Principal Problem:   Mass of colon Active Problems:   Essential thrombocytosis (HCC)   Lumbar radiculopathy   Bowel obstruction (HCC)   Nausea & vomiting   Malnutrition of moderate degree   Cellulitis   Leukocytosis    Results:   Labs: Results for orders placed or performed during the hospital encounter of 02/29/16 (from the past 48 hour(s))  CBC     Status: Abnormal   Collection Time: 03/13/16  4:09 AM  Result Value Ref Range   WBC 18.9 (H) 4.0 - 10.5 K/uL   RBC 3.50 (L) 3.87 - 5.11 MIL/uL   Hemoglobin 7.4 (L) 12.0 - 15.0 g/dL   HCT 22.7 (L) 36.0 - 46.0 %   MCV 64.9 (L) 78.0 - 100.0 fL   MCH 21.1 (L) 26.0 - 34.0 pg   MCHC  32.6 30.0 - 36.0 g/dL   RDW 16.5 (H) 11.5 - 15.5 %   Platelets 545 (H) 150 - 400 K/uL  Basic metabolic panel     Status: Abnormal   Collection Time: 03/13/16  4:09 AM  Result Value Ref Range   Sodium 129 (L) 135 - 145 mmol/L   Potassium 3.6 3.5 - 5.1 mmol/L   Chloride 96 (L) 101 - 111 mmol/L   CO2 28 22 - 32 mmol/L   Glucose, Bld 70 65 - 99 mg/dL   BUN <5 (L) 6 - 20 mg/dL   Creatinine, Ser 0.36 (L) 0.44 - 1.00 mg/dL   Calcium 7.5 (L) 8.9 - 10.3 mg/dL   GFR calc non Af Amer >60 >60 mL/min   GFR calc Af Amer >60 >60 mL/min    Comment: (NOTE) The eGFR has been calculated using the CKD EPI equation. This calculation has not been validated in all clinical situations. eGFR's persistently <60 mL/min signify possible Chronic Kidney Disease.    Anion gap 5 5 - 15  Glucose, capillary     Status: None   Collection Time: 03/13/16  8:16 AM  Result Value Ref Range   Glucose-Capillary 90 65 - 99 mg/dL  CBC  Status: Abnormal   Collection Time: 03/14/16  5:55 AM  Result Value Ref Range   WBC 17.2 (H) 4.0 - 10.5 K/uL   RBC 3.67 (L) 3.87 - 5.11 MIL/uL   Hemoglobin 7.5 (L) 12.0 - 15.0 g/dL   HCT 23.7 (L) 36.0 - 46.0 %   MCV 64.6 (L) 78.0 - 100.0 fL   MCH 20.4 (L) 26.0 - 34.0 pg   MCHC 31.6 30.0 - 36.0 g/dL   RDW 16.6 (H) 11.5 - 15.5 %   Platelets 707 (H) 150 - 400 K/uL  Glucose, capillary     Status: None   Collection Time: 03/14/16  8:00 AM  Result Value Ref Range   Glucose-Capillary 96 65 - 99 mg/dL   Comment 1 Notify RN     Imaging / Studies: No results found.  Medications / Allergies:  Scheduled Meds: . antiseptic oral rinse  7 mL Mouth Rinse q12n4p  . chlorhexidine  15 mL Mouth Rinse BID  . DULoxetine  60 mg Oral Daily  . enoxaparin (LOVENOX) injection  40 mg Subcutaneous Daily  . furosemide  20 mg Oral Daily  . imipenem-cilastatin  500 mg Intravenous Q6H  . methadone  10 mg Oral TID  . sodium chloride flush  3 mL Intravenous Q12H  . tamsulosin  0.4 mg Oral Daily  .  vancomycin  1,000 mg Intravenous Q12H   Continuous Infusions:  PRN Meds:.acetaminophen **OR** acetaminophen, HYDROmorphone (DILAUDID) injection, LORazepam, methocarbamol (ROBAXIN)  IV, metoprolol, oxyCODONE, traMADol  Antibiotics: Anti-infectives    Start     Dose/Rate Route Frequency Ordered Stop   03/12/16 0400  vancomycin (VANCOCIN) IVPB 1000 mg/200 mL premix     1,000 mg 200 mL/hr over 60 Minutes Intravenous Every 12 hours 03/11/16 1513     03/11/16 1600  vancomycin (VANCOCIN) 1,500 mg in sodium chloride 0.9 % 500 mL IVPB     1,500 mg 250 mL/hr over 120 Minutes Intravenous  Once 03/11/16 1513 03/11/16 2041   03/11/16 0900  imipenem-cilastatin (PRIMAXIN) 500 mg in sodium chloride 0.9 % 100 mL IVPB     500 mg 200 mL/hr over 30 Minutes Intravenous Every 6 hours 03/11/16 0738     03/10/16 1400  clindamycin (CLEOCIN) IVPB 600 mg  Status:  Discontinued     600 mg 100 mL/hr over 30 Minutes Intravenous Every 8 hours 03/10/16 1104 03/11/16 0738   03/10/16 1145  cefTRIAXone (ROCEPHIN) 1 g in dextrose 5 % 50 mL IVPB  Status:  Discontinued     1 g 100 mL/hr over 30 Minutes Intravenous Every 24 hours 03/10/16 1138 03/10/16 1144   03/03/16 0846  cefoTEtan in Dextrose 5% (CEFOTAN) 2-2.08 GM-% IVPB    Comments:  Forte, Lindsi   : cabinet override      03/03/16 0846 03/03/16 2059   03/03/16 0600  cefoTEtan (CEFOTAN) 2 g in dextrose 5 % 50 mL IVPB    Comments:  In OR holding pre op   2 g 100 mL/hr over 30 Minutes Intravenous On call to O.R. 03/02/16 1054 03/03/16 1009   03/02/16 1000  cefoTEtan (CEFOTAN) 2 g in dextrose 5 % 50 mL IVPB  Status:  Discontinued    Comments:  In OR holding pre op   2 g 100 mL/hr over 30 Minutes Intravenous Every 12 hours 03/02/16 0959 03/02/16 1054        Assessment/Plan Metastatic adenocarcinoma of the colon(Dr. Benay Spice)  POD#11 exploratory laparotomy, descending colostomy, mucus fistula, repair of chronic serosal tears---Dr. Brantley Stage 03/03/16 -  regular diet,  appetite improving -Continue with methadone, prn oxy -mobilize, IS -BID wet to dry dressing changes(dakin's 6/10-6/13) ID-coag negative staph 2/2 bottles, bacteremia +abdominal wall cellulitis, Vanc and primaxin. cellulitis is improving Left abdominal wall cellulitis-can change to PO antibiotics, but will leave up to primary team given bacteremia  Urinary retention-foley once again replaced, management per urology. Plans for JJ stents next tuesday(Dr. 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 appetite has greatly improved VTE prophylaxis-scd, lovenox Dispo-surgically stable for discharge. discussed discharge with Dr. Wynetta Emery, likely tomorrow. I have therefore contacted our office to coordinate port placement at the same time as Dr. Arlyn Leak procedure.   Erby Pian, [facility] Pager [phone]) For consults and floor pages call [phone])  03/14/2016 8:26 AM

## 2016-03-14 NOTE — Progress Notes (Signed)
PROGRESS NOTE    Carolyn Barton  [id number] DOB: June 06, 1956 DOA: 02/29/2016   PCP: Marton Redwood, MD   Brief Narrative:  60 y.o. female with chronic back pain, thrombocytosis, depression, tachycardia, who presented with nausea, vomiting, abdominal pain and constipation. Imaging studies notable for colonic mass in distal sigmoid colon, measuring 6.6 x 3.2 x 3.8 cm, resulting in partial obstruction, compatible with primary colonic malignancy, small adjacent nodules, concerning for local spread of disease. Pt is now S/p resection on 6/5.  Major events since admission: 6/12 - worsening cellulitis at the abd area, started Clindamycin, blood cultures obtained  6/13 - blood cultures 2/2 G+ cocci in clusters, ABX changed to Primaxin and Vancomycin, pt Ok to transfer out of SDU  6/14 - pt reports feeling better  Assessment & Plan:   Colonic mass, Metastatic Ca. 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 Brantley Stage 03/03/16 - diet advanced to regular and pt so far tolerating well  - encourage OOB to chair and ambulating with assistance, pt has been doing well from that standpoint and will need HHPT once d/c - also encouraged use of spirometer while awake, educated in proper use of spirometer and importance of its use in overall recovery - appreciate surgery team following   Sepsis secondary to Cellulitis 6/12, bacteremia with g+ cocci in clusters (preliminar report) - erythema and TTP extending from the outside the wounds site at the umbilical level and towards the left flank area, slowly improving on IV abx - WBC trending down, no fevers overnight but still tachycardic  - continue Primaxin and Vancomycin, started June 13th, 2017, reassess tomorrow for transition to oral antibiotics - will need at least two weeks ABX total or longer if cellulitis still  present  - once WBC trending down and pt no longer tach, suspect can change to PO ABX  - repeat blood cultures - no growth to date  Hypotension/tachycardia - slowly improving - ABX as noted above  - gave one dose of Lasix 20 mg IV 6/10 and pt's weight is down from 162 --> 159 lbs --> 157 lbs - started home regimen with Lasix 20 mg PO QD   Pleural effusions, anasarca - improving, weight trending down as outlined above - resume home lasix 20 mg PO QD 6/15 (please note pt has gotten several dose of lasix 20 mg IV in the past week and has responded well but her K and Mg drop significantly with even low dose) - continue to monitor daily weights, strict I/O  Moderate protein calorie malnutrition  - nutritionist consulted - pt tolerating diet well    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 - pain controlled on chronic home regimen with methadone   Anemia of acute illness - with some post op bleed contributing as well - HG slightly down this AM, if < 7, will plan of transfusing PRBC - CBC pending this morning, will follow   Thrombocytosis, essential  - reactive  - CBC in AM  Hypokalemia and hypomagnesemia - supplemented and both electrolytes at target range   N/V - resolved  - allow zofran as needed   Hyponatremia - improving from 126 --> 129 this AM  - will need to monitor closely  - BMP in AM  Urinary retention - foley taken out but pt with 700 cc in bladder after 8 hours of foley being taken out  -  per urology team, planning for L JJ stent next week (? Tuesday and can be done here if pt still hospitalized) - continue with in/out cath  - appreciate urology team assistance   DVT prophylaxis:  Lovenox SQ  Code Status: Full Code  Disposition Plan:  Home once cleared by surgical teams  Consultants:   Surgery   Oncology   Urology   Procedures:   Colonic resection  Antibiotics:  Primaxin 6/13 -->  Vancomycin 6/13 -->  Subjective: Pt says that she is starting to  feel better.  No fever or chills.  Has felt warm at times.    Objective: Filed Vitals:   03/13/16 0535 03/13/16 1318 03/13/16 2120 03/14/16 0555  BP: 120/63 130/67 122/67 127/79  Pulse: 102 108 101 101  Temp: 98.6 F (37 C) 98.2 F (36.8 C) 98.3 F (36.8 C) 98.9 F (37.2 C)  TempSrc: Oral Oral Oral Oral  Resp: 20 18 17 18   Height:      Weight:    158 lb 1.1 oz (71.7 kg)  SpO2: 93% 95% 93% 95%    Intake/Output Summary (Last 24 hours) at 03/14/16 [id number] Last data filed at 03/14/16 0557  Gross per 24 hour  Intake    480 ml  Output   1800 ml  Net  -1320 ml   Filed Weights   03/12/16 0500 03/13/16 0447 03/14/16 0555  Weight: 157 lb 6.5 oz (71.4 kg) 158 lb 11.7 oz (72 kg) 158 lb 1.1 oz (71.7 kg)    Examination:  General exam: more awake and interactive today Respiratory system: mild tachypnea with diminished breath sounds at bases and crackles  Cardiovascular system: S1 & S2 heard, tachy-sinus Gastrointestinal system: non tender, open site looks ok, at the umbilical level and outside the wounds border, area of erythema and TTP and warmth to touch that extends toward left flank, bright erythema in central area, still somewhat TTP  Central nervous system: Alert and oriented. No focal neurological deficits. Extremities: Lower and upper extremity edema still present  Data Reviewed: I have personally reviewed following labs and imaging studies  CBC:  Recent Labs Lab 03/09/16 0504 03/10/16 0406 03/11/16 0241 03/12/16 0340 03/13/16 0409  WBC 8.3 11.4* 14.3* 20.9* 18.9*  HGB 9.0* 8.7* 7.7* 7.3* 7.4*  HCT 27.9* 26.8* 23.5* 23.1* 22.7*  MCV 66.4* 65.5* 65.6* 66.6* 64.9*  PLT 320 324 354 455* [id number]*   Basic Metabolic Panel:  Recent Labs Lab 03/09/16 0504 03/10/16 0406 03/11/16 0241 03/12/16 0340 03/13/16 0409  NA 130* 130* 127* 126* 129*  K 2.8* 3.7 4.7 3.9 3.6  CL 99* 98* 98* 96* 96*  CO2 23 26 24 26 28   GLUCOSE 71 99 79 99 70  BUN 7 8 5* 6 <5*  CREATININE 0.40*  0.38* 0.39* 0.41* 0.36*  CALCIUM 7.8* 7.6* 7.7* 7.5* 7.5*  MG 1.5* 1.8 1.8  --   --    CBG:  Recent Labs Lab 03/09/16 0742 03/09/16 0852 03/10/16 0722 03/11/16 0746 03/13/16 0816  GLUCAP 66 101* 95 93 90   Urine analysis:    Component Value Date/Time   COLORURINE AMBER* 03/06/2016 0157   APPEARANCEUR CLEAR 03/06/2016 0157   LABSPEC 1.019 03/06/2016 0157   PHURINE 5.5 03/06/2016 0157   GLUCOSEU 100* 03/06/2016 0157   HGBUR SMALL* 03/06/2016 0157   BILIRUBINUR NEGATIVE 03/06/2016 0157   KETONESUR NEGATIVE 03/06/2016 0157   PROTEINUR 30* 03/06/2016 0157   UROBILINOGEN 1.0 12/18/2012 1754   NITRITE NEGATIVE 03/06/2016 0157  LEUKOCYTESUR NEGATIVE 03/06/2016 0157   Recent Results (from the past 240 hour(s))  Surgical PCR screen     Status: None   Collection Time: 03/03/16  4:00 AM  Result Value Ref Range Status   MRSA, PCR NEGATIVE NEGATIVE Final   Staphylococcus aureus NEGATIVE NEGATIVE Final    Anti-infectives    Start     Dose/Rate Route Frequency Ordered Stop   03/12/16 0400  vancomycin (VANCOCIN) IVPB 1000 mg/200 mL premix     1,000 mg 200 mL/hr over 60 Minutes Intravenous Every 12 hours 03/11/16 1513     03/11/16 1600  vancomycin (VANCOCIN) 1,500 mg in sodium chloride 0.9 % 500 mL IVPB     1,500 mg 250 mL/hr over 120 Minutes Intravenous  Once 03/11/16 1513 03/11/16 2041   03/11/16 0900  imipenem-cilastatin (PRIMAXIN) 500 mg in sodium chloride 0.9 % 100 mL IVPB     500 mg 200 mL/hr over 30 Minutes Intravenous Every 6 hours 03/11/16 0738     03/10/16 1400  clindamycin (CLEOCIN) IVPB 600 mg  Status:  Discontinued     600 mg 100 mL/hr over 30 Minutes Intravenous Every 8 hours 03/10/16 1104 03/11/16 0738   03/10/16 1145  cefTRIAXone (ROCEPHIN) 1 g in dextrose 5 % 50 mL IVPB  Status:  Discontinued     1 g 100 mL/hr over 30 Minutes Intravenous Every 24 hours 03/10/16 1138 03/10/16 1144   03/03/16 0846  cefoTEtan in Dextrose 5% (CEFOTAN) 2-2.08 GM-% IVPB    Comments:   Forte, Lindsi   : cabinet override      03/03/16 0846 03/03/16 2059   03/03/16 0600  cefoTEtan (CEFOTAN) 2 g in dextrose 5 % 50 mL IVPB    Comments:  In OR holding pre op   2 g 100 mL/hr over 30 Minutes Intravenous On call to O.R. 03/02/16 1054 03/03/16 1009   03/02/16 1000  cefoTEtan (CEFOTAN) 2 g in dextrose 5 % 50 mL IVPB  Status:  Discontinued    Comments:  In OR holding pre op   2 g 100 mL/hr over 30 Minutes Intravenous Every 12 hours 03/02/16 0959 03/02/16 1054     Radiology Studies: No results found.  Scheduled Meds: . antiseptic oral rinse  7 mL Mouth Rinse q12n4p  . chlorhexidine  15 mL Mouth Rinse BID  . DULoxetine  60 mg Oral Daily  . enoxaparin (LOVENOX) injection  40 mg Subcutaneous Daily  . furosemide  20 mg Oral Daily  . imipenem-cilastatin  500 mg Intravenous Q6H  . methadone  10 mg Oral TID  . sodium chloride flush  3 mL Intravenous Q12H  . tamsulosin  0.4 mg Oral Daily  . vancomycin  1,000 mg Intravenous Q12H   Continuous Infusions:     LOS: 13 days   Time spent: 35 min  Irwin Brakeman, MD [facility] Pager [phone]  If 7PM-7AM, please contact night-coverage [email or web address] Password [facility] 03/14/2016, 7:28 AM

## 2016-03-15 DIAGNOSIS — M5416 Radiculopathy, lumbar region: Secondary | ICD-10-CM

## 2016-03-15 DIAGNOSIS — R339 Retention of urine, unspecified: Secondary | ICD-10-CM

## 2016-03-15 LAB — COMPREHENSIVE METABOLIC PANEL
ALBUMIN: 1.4 g/dL — AB (ref 3.5–5.0)
ALK PHOS: 75 U/L (ref 38–126)
ALT: 8 U/L — AB (ref 14–54)
AST: 13 U/L — AB (ref 15–41)
Anion gap: 7 (ref 5–15)
BILIRUBIN TOTAL: 0.1 mg/dL — AB (ref 0.3–1.2)
CALCIUM: 7.6 mg/dL — AB (ref 8.9–10.3)
CO2: 29 mmol/L (ref 22–32)
Chloride: 95 mmol/L — ABNORMAL LOW (ref 101–111)
Creatinine, Ser: <0.3 mg/dL — ABNORMAL LOW (ref 0.44–1.00)
GLUCOSE: 109 mg/dL — AB (ref 65–99)
Potassium: 3.5 mmol/L (ref 3.5–5.1)
SODIUM: 131 mmol/L — AB (ref 135–145)
TOTAL PROTEIN: 4.3 g/dL — AB (ref 6.5–8.1)

## 2016-03-15 LAB — CBC
HEMATOCRIT: 24.3 % — AB (ref 36.0–46.0)
HEMOGLOBIN: 7.6 g/dL — AB (ref 12.0–15.0)
MCH: 20.7 pg — ABNORMAL LOW (ref 26.0–34.0)
MCHC: 31.3 g/dL (ref 30.0–36.0)
MCV: 66 fL — ABNORMAL LOW (ref 78.0–100.0)
Platelets: 727 10*3/uL — ABNORMAL HIGH (ref 150–400)
RBC: 3.68 MIL/uL — AB (ref 3.87–5.11)
RDW: 16.8 % — ABNORMAL HIGH (ref 11.5–15.5)
WBC: 16.5 10*3/uL — AB (ref 4.0–10.5)

## 2016-03-15 LAB — GLUCOSE, CAPILLARY: Glucose-Capillary: 96 mg/dL (ref 65–99)

## 2016-03-15 MED ORDER — SULFAMETHOXAZOLE-TRIMETHOPRIM 800-160 MG PO TABS: 1.0000 | ORAL_TABLET | Freq: Three times a day (TID) | ORAL | Status: AC

## 2016-03-15 MED ORDER — TAMSULOSIN HCL 0.4 MG PO CAPS: 0.4000 mg | ORAL_CAPSULE | Freq: Every day | ORAL | Status: DC

## 2016-03-15 MED ORDER — SULFAMETHOXAZOLE-TRIMETHOPRIM 800-160 MG PO TABS
1.0000 | ORAL_TABLET | Freq: Three times a day (TID) | ORAL | Status: DC
  Administered 2016-03-15: 1 via ORAL
  Filled 2016-03-15: qty 1

## 2016-03-15 MED ORDER — CIPROFLOXACIN HCL 500 MG PO TABS: 500.0000 mg | ORAL_TABLET | Freq: Two times a day (BID) | ORAL | Status: DC

## 2016-03-15 NOTE — Discharge Summary (Signed)
Physician Discharge Summary  Carolyn Barton  JYN:[id number]  DOB: 31-Oct-1955  DOA: 02/29/2016  PCP: Marton Redwood, MD  Admit date: 02/29/2016 Discharge date: 03/15/2016  Time spent: 45 minutes  Recommendations for Outpatient Follow-up:  1. Follow up in 3 days at [facility] for arranged procedures with urology (Dr. Gaynelle Arabian) and general surgery.   Discharge Diagnoses:  Principal Problem:   Mass of colon Active Problems:   Essential thrombocytosis (HCC)   Lumbar radiculopathy   Bowel obstruction (HCC)   Nausea & vomiting   Malnutrition of moderate degree   Cellulitis   Leukocytosis  Discharge Condition: Improved & Stable  Filed Weights   03/13/16 0447 03/14/16 0555 03/15/16 0605  Weight: 158 lb 11.7 oz (72 kg) 158 lb 1.1 oz (71.7 kg) 153 lb 10.6 oz (69.7 kg)   History of present illness:  She reports feeling well until approximately one week prior to hospital admission which she developed the onset of nausea and constipation. She presented to the emergency room 02/29/2016 with nausea/vomiting and abdominal pain. A CT of the abdomen and pelvis on 02/29/2016 revealed nonspecific hypodensities in the liver, unremarkable kidneys. The colon was diffusely filled with fluid and air with evidence of a partial obstruction due to a mass at the distal sigmoid colon. Small adjacent nodules were seen. Small volume of ascites. An NG tube was placed and surgery was consulted. She was taken to the operating room on 03/03/2016. The large and small bowel were dilated consistent with an obstruction. Studding of the pelvic peritoneum was noted adjacent to a large obstructing mass at the proximal rectum. The mass was adherent to the left pelvic sidewall. Chronic serosal tears were noted of the cecum. Frozen sections of 2 peritoneal nodules were consistent with adenocarcinoma. Multiple 1 cm white lesions were noted in the liver consistent with metastatic disease. The left ureter appeared mildly dilated  where it entered the pelvic sidewall at the adherent cancer. The ureter and tumor could not be easily separated. A decision was made to proceed with diversion and not attempt to resect the primary tumor. An end colostomy was created at the descending colon.  The pathology ([id number]) of 2 peritoneal nodules returned as metastatic adenocarcinoma consistent with a colonic primary.  Hospital Course:   Colonic mass, Metastatic Ca. of the rectosigmoid colon  - Obstructing large bowel mass, likely adenocarcinoma with metastasis to liver  - POD# 12 exploratory laparotomy, descending colostomy, mucus fistula by Dr. Brantley Stage 03/03/16 - diet advanced to regular and pt so far tolerating well  - encourage OOB to chair and ambulating with assistance, pt has been doing well from that standpoint and will need HHPT and rolling walker - also encouraged use of spirometer while awake, educated in proper use of spirometer and importance of its use in overall recovery - appreciate surgery team following - they are ok with discharge and coordinated possible port placement in 3 days with Dr. Gaynelle Arabian  Sepsis secondary to Cellulitis 6/12, bacteremia with MRSE - erythema and TTP extending from the outside the wounds site at the umbilical level and towards the left flank area, significantly improved on day of discharge 6/17 - WBC trending down, no fevers and tachycardia resolved  - Treated with Primaxin and Vancomycin, started June 13th, 2017, sending home on oral bactrim DS TID x 10 more days.  - will need at least two weeks ABX total   - repeated blood cultures - no growth to date  Hypotension/tachycardia - resolved - ABX as noted  above   Pleural effusions, anasarca - improving, weight trending down as outlined above - resume home lasix 20 mg PO QD 6/15  Moderate protein calorie malnutrition  - nutritionist consulted - pt tolerating diet well   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 - pain controlled on  chronic home regimen with methadone   Anemia of acute illness - with some post op bleed contributing as well - Hg holding stable at 7.6 after transfusion of 1 unit.    Thrombocytosis, essential  - reactive  - recommend repeating CBC in 3 days  Hypokalemia and hypomagnesemia - supplemented and both electrolytes at target range   N/V - resolved and tolerating diet  Hyponatremia - improving from 126 to 131 prior to discharge - REcommend repeat BMP in 3 days  Urinary retention - Pt will be sent home with foley in place.    - per urology team, planning for L JJ stent in 3 days,  - General surgery is coordinating possible port placement in 3 days with urology procedure (arranged by general surgery team)  DVT prophylaxis:  Lovenox SQ  Code Status: Full Code  Disposition Plan:  Home once cleared by surgical teams  Consultants:   Surgery   Oncology   Urology  Procedures:   Colonic resection  Antibiotics:  Primaxin 6/13 -->6/16  Vancomycin 6/13 -->6/17  Discharge Exam: Pt feels better and feels ready to go home, she understands the follow up at [place] in 3 days  Filed Vitals:   03/14/16 1450 03/14/16 2149 03/15/16 0605 03/15/16 0612  BP: 122/76 126/70  126/74  Pulse: 94 100  99  Temp: 98.1 F (36.7 C) 98.2 F (36.8 C)  98.2 F (36.8 C)  TempSrc: Oral Oral  Oral  Resp: '18 17  17  '$ Height:      Weight:   153 lb 10.6 oz (69.7 kg)   SpO2: 95% 92%  92%   General exam: more awake and interactive today Respiratory system: mild tachypnea with diminished breath sounds at bases and crackles  Cardiovascular system: S1 & S2 heard, normal rate Gastrointestinal system: non tender, open site looks ok, at the umbilical level and outside the wounds border, erythema much improved almost gone completely  Central nervous system: Alert and oriented. No focal neurological deficits. Extremities: Lower and upper extremity edema improved from yesterday.    Discharge Instructions  Discharge Instructions    Diet - low sodium heart healthy    Complete by:  As directed      Discharge instructions    Complete by:  As directed   Follow up at [facility] on Tuesday for Scheduled procedures with surgery and urology. Return for any problems.     Increase activity slowly    Complete by:  As directed             Medication List    STOP taking these medications        MOVANTIK 25 MG Tabs tablet  Generic drug:  naloxegol oxalate     Na Sulfate-K Sulfate-Mg Sulf 17.5-3.13-1.6 GM/180ML Soln  Commonly known as:  SUPREP BOWEL PREP KIT     vitamin B-12 1000 MCG tablet  Commonly known as:  CYANOCOBALAMIN     Vitamin D (Ergocalciferol) 50000 units Caps capsule  Commonly known as:  DRISDOL      TAKE these medications        DULoxetine 60 MG capsule  Commonly known as:  CYMBALTA  Take 60 mg by mouth daily.  furosemide 20 MG tablet  Commonly known as:  LASIX  Take 20 mg by mouth daily.     HYDROmorphone 4 MG tablet  Commonly known as:  DILAUDID  Take by mouth every 4 (four) hours as needed for severe pain.     methadone 10 MG tablet  Commonly known as:  DOLOPHINE  Take 10 mg by mouth 3 (three) times daily.     sulfamethoxazole-trimethoprim 800-160 MG tablet  Commonly known as:  BACTRIM DS,SEPTRA DS  Take 1 tablet by mouth every 8 (eight) hours.     tamsulosin 0.4 MG Caps capsule  Commonly known as:  FLOMAX  Take 1 capsule (0.4 mg total) by mouth daily.           Follow-up Information    Follow up with CORNETT,THOMAS A., MD.   Specialty:  General Surgery   Why:  our office will call you to schedule a follow up with the surgeon    Contact information:   [place] [address] [phone]       Follow up with Ailene Rud, MD In 3 days.   Specialty:  Urology   Why:  at [facility] for procedures as scheduled and arranged   Contact information:   [place]  [address] [phone]       Get Medicines reviewed and adjusted: Please take all your medications with you for your next visit with your Primary MD  Please request your Primary MD to go over all hospital tests and procedure/radiological results at the follow up. Please ask your Primary MD to get all Hospital records sent to his/her office.  If you experience worsening of your admission symptoms, develop shortness of breath, life threatening emergency, suicidal or homicidal thoughts you must seek medical attention immediately by calling 911 or calling your MD immediately if symptoms less severe.  You must read complete instructions/literature along with all the possible adverse reactions/side effects for all the Medicines you take and that have been prescribed to you. Take any new Medicines after you have completely understood and accept all the possible adverse reactions/side effects.   Do not drive when taking pain medications.   Do not take more than prescribed Pain, Sleep and Anxiety Medications  Special Instructions: If you have smoked or chewed Tobacco in the last 2 yrs please stop smoking, stop any regular Alcohol and or any Recreational drug use.  Wear Seat belts while driving.  Please note  You were cared for by a hospitalist during your hospital stay. Once you are discharged, your primary care physician will handle any further medical issues. Please note that NO REFILLS for any discharge medications will be authorized once you are discharged, as it is imperative that you return to your primary care physician (or establish a relationship with a primary care physician if you do not have one) for your aftercare needs so that they can reassess your need for medications and monitor your lab values.   The results of significant diagnostics from this hospitalization (including imaging, microbiology, ancillary and laboratory) are listed below for reference.    Significant Diagnostic  Studies: Dg Chest 2 View  03/06/2016  CLINICAL DATA:  Shortness of breath and weakness EXAM: CHEST  2 VIEW COMPARISON:  12/17/2012 FINDINGS: The heart size appears normal. Aortic atherosclerosis noted. Lung volumes are low. There are bilateral pleural effusions identified. Overlying platelike atelectasis identified. IMPRESSION: 1. Low lung volumes and bilateral pleural effusions. Electronically  Signed   By: Kerby Moors M.D.   On: 03/06/2016 08:28   Ct Abdomen Pelvis W Contrast  03/10/2016  CLINICAL DATA:  Pt with follow-up on blister, infected, abdominal wall, mass of colon. EXAM: CT ABDOMEN AND PELVIS WITH CONTRAST TECHNIQUE: Multidetector CT imaging of the abdomen and pelvis was performed using the standard protocol following bolus administration of intravenous contrast. CONTRAST:  100 ml ISOVUE-300 IOPAMIDOL (ISOVUE-300) INJECTION 61% COMPARISON:  02/29/2016 FINDINGS: Since the prior exam, patient has undergone surgery with formation of a left lower quadrant colostomy decompressing the bowel obstruction caused by the apparent distal colonic mass. Subcutaneous air surrounds the colostomy. The skin and subcutaneous soft tissues along the anterior abdominal wall incision remains open. The fascia is closed. There are air-fluid levels within the stomach, small bowel and colon consistent with a postoperative adynamic ileus. There is no significant bowel distention. Diffuse edema is seen throughout subcutaneous soft tissues, most evident along the flanks, new since the prior exam. There is no defined fluid collection within the abdomen or the abdominal wall suggest an abscess. Lung bases: Small effusions, right greater than left, with associated lung base atelectasis. Stable hiatal hernia. Hepatobiliary: Subtle liver lesions noted on the prior study are not resolved on this unenhanced scan. Liver unremarkable. Gallbladder is unremarkable. No bile duct dilation. Spleen, pancreas, adrenal glands:  Unremarkable.  Kidneys, ureters, bladder: No renal mass or stone. No hydronephrosis. Ureters not well-defined. Bladder with nondependent air likely from recent instrumentation. Peritoneal cavity: Small amount ascites. Minimal free air likely postoperative in origin. Lymph nodes: No definitive enlarged lymph nodes. Small nodules in the pelvis noted on the prior study are less well-defined. Musculoskeletal: Stable postsurgical changes from L5-S1 fusion. No osteoblastic or osteolytic lesions. IMPRESSION: 1. Since the prior exam, the patient has undergone abdominal surgery with formation of a left lower quadrant colostomy. There is subcutaneous air surrounding the colostomy. There is no significant residual bowel obstruction. Air-fluid levels within the bowel is consistent with a postoperative adynamic ileus. There is also diffuse subcutaneous edema which is new. The skin and subcutaneous soft tissues of the anterior abdominal wall incision are open. The fascia is closed. 2. There is a small amount of ascites, which has increased from the prior exam. 3. Small pleural effusions with lung base atelectasis is new from the prior study. 4. There is no defined fluid collection to suggest an abscess. Electronically Signed   By: Lajean Manes M.D.   On: 03/10/2016 21:00   Ct Abdomen Pelvis W Contrast  02/29/2016  CLINICAL DATA:  Acute onset of constipation, nausea and vomiting. Initial encounter. EXAM: CT ABDOMEN AND PELVIS WITH CONTRAST TECHNIQUE: Multidetector CT imaging of the abdomen and pelvis was performed using the standard protocol following bolus administration of intravenous contrast. CONTRAST:  134m ISOVUE-300 IOPAMIDOL (ISOVUE-300) INJECTION 61% COMPARISON:  CT of the lumbar spine performed 01/25/2014, and abdominal radiograph performed earlier today at 2:46 p.m. FINDINGS: The visualized lung bases are clear. A moderate hiatal hernia is noted. The patient's enteric tube is seen coiled within the hiatal hernia, ending about the  superior aspect of the hernia. Small volume ascites is noted within the abdomen and pelvis. Nonspecific hypodensities are seen within the liver, measuring up to 2.2 cm in size. Metastatic disease cannot be excluded. The spleen is unremarkable in appearance. The gallbladder is within normal limits. The pancreas and adrenal glands are unremarkable. The kidneys are unremarkable in appearance. There is no evidence of hydronephrosis. No renal or ureteral stones are  seen. No perinephric stranding is appreciated. No free fluid is identified. The small bowel is unremarkable in appearance. The stomach is within normal limits. No acute vascular abnormalities are seen. The appendix is not definitely characterized. The colon is diffusely filled with fluid and air. There is interposition of the hepatic flexure of the colon anterior to the liver. This appears to reflect partial obstruction due to a mass at the distal sigmoid colon, measuring approximately 6.6 x 3.2 x 3.8 cm. Small adjacent nodules are seen, concerning for local spread of disease. No pelvic sidewall lymphadenopathy is seen. The bladder is mildly distended and grossly unremarkable in appearance. A 1.7 cm nodule posterior to the uterus may simply reflect an exophytic fibroid. The uterus is otherwise unremarkable. The ovaries are relatively symmetric. No suspicious adnexal masses are seen. No inguinal lymphadenopathy is seen. No acute osseous abnormalities are identified. The patient is status post lumbar spinal fusion at L5-S1. IMPRESSION: 1. Colonic mass noted at the distal sigmoid colon, measuring 6.6 x 3.2 x 3.8 cm, resulting in partial obstruction. The colon is diffusely filled with fluid and air, more proximally. Findings compatible with primary colonic malignancy. 2. Small adjacent nodules seen, concerning for local spread of disease. No pelvic sidewall lymphadenopathy seen. 3. Small volume ascites within the abdomen and pelvis. 4. Nonspecific hypodensities  within the liver, measuring up to 2.2 cm in size. Metastatic disease cannot be excluded. Would correlate with LFTs, and consider further evaluation as deemed clinically appropriate. 5. Moderate hiatal hernia seen. 6. Enteric tube noted coiled within the hiatal hernia, ending about the superior aspect of the hernia. 7. 1.7 cm nodule posterior to the uterus may simply reflect an exophytic fibroid. Electronically Signed   By: Garald Balding M.D.   On: 02/29/2016 22:18   Dg Abd 2 Views  03/02/2016  ADDENDUM REPORT: 03/02/2016 12:40 ADDENDUM: NG tube is buckled at the side port and looped back on itself in the distal esophagus. Electronically Signed   By: Rolm Baptise M.D.   On: 03/02/2016 12:40  03/02/2016  CLINICAL DATA:  Bowel obstruction EXAM: ABDOMEN - 2 VIEW COMPARISON:  02/29/2016 FINDINGS: Again noted are dilated large and small bowel loops as seen on prior study, unchanged, concerning for distal obstruction. No free air organomegaly. IMPRESSION: Stable distal obstruction pattern. Electronically Signed: By: Rolm Baptise M.D. On: 03/02/2016 11:44   Dg Abd 2 Views  02/29/2016  CLINICAL DATA:  Constipation and vomit EXAM: ABDOMEN - 2 VIEW COMPARISON:  None. FINDINGS: Supine and upright images obtained. There is generalized bowel dilatation with multiple air-fluid levels. No free air evident. Postoperative changes noted at L5 and S1. There is atelectatic change in lung bases. IMPRESSION: Bowel gas pattern is consistent with obstruction. No free air evident. Atelectasis lung bases, slightly more on the right than on the left. Electronically Signed   By: Lowella Grip III M.D.   On: 02/29/2016 15:15    Microbiology: Recent Results (from the past 240 hour(s))  Urine culture     Status: None   Collection Time: 03/06/16  1:57 AM  Result Value Ref Range Status   Specimen Description URINE, CATHETERIZED  Final   Special Requests NONE  Final   Culture NO GROWTH  Final   Report Status 03/07/2016 FINAL   Final  Culture, blood (routine x 2)     Status: Abnormal   Collection Time: 03/10/16  1:45 PM  Result Value Ref Range Status   Specimen Description BLOOD LEFT FOREARM  Final  Special Requests BOTTLES DRAWN AEROBIC AND ANAEROBIC 10CC  Final   Culture  Setup Time   Final    GRAM POSITIVE COCCI IN CLUSTERS IN BOTH AEROBIC AND ANAEROBIC BOTTLES CRITICAL RESULT CALLED TO, READ BACK BY AND VERIFIED WITH: C. STEWART, PHARM D AT 59 ON [id number] BY Rhea Bleacher    Culture (A)  Final    STAPHYLOCOCCUS SPECIES (COAGULASE NEGATIVE) SUSCEPTIBILITIES PERFORMED ON PREVIOUS CULTURE WITHIN THE LAST 5 DAYS.    Report Status 03/13/2016 FINAL  Final  Culture, blood (routine x 2)     Status: Abnormal   Collection Time: 03/10/16  2:00 PM  Result Value Ref Range Status   Specimen Description BLOOD LEFT HAND  Final   Special Requests BOTTLES DRAWN AEROBIC AND ANAEROBIC 10CC  Final   Culture  Setup Time   Final    GRAM POSITIVE COCCI IN CLUSTERS IN BOTH AEROBIC AND ANAEROBIC BOTTLES CRITICAL RESULT CALLED TO, READ BACK BY AND VERIFIED WITH: J. Darleen Crocker D AT 1115 ON [id number] BY M. WILSON    Culture STAPHYLOCOCCUS SPECIES (COAGULASE NEGATIVE) (A)  Final   Report Status 03/13/2016 FINAL  Final   Organism ID, Bacteria STAPHYLOCOCCUS SPECIES (COAGULASE NEGATIVE)  Final      Susceptibility   Staphylococcus species (coagulase negative) - MIC*    CIPROFLOXACIN <=0.5 SENSITIVE Sensitive     ERYTHROMYCIN <=0.25 SENSITIVE Sensitive     GENTAMICIN <=0.5 SENSITIVE Sensitive     OXACILLIN >=4 RESISTANT Resistant     TETRACYCLINE <=1 SENSITIVE Sensitive     VANCOMYCIN 1 SENSITIVE Sensitive     TRIMETH/SULFA <=10 SENSITIVE Sensitive     CLINDAMYCIN <=0.25 SENSITIVE Sensitive     RIFAMPIN <=0.5 SENSITIVE Sensitive     Inducible Clindamycin NEGATIVE Sensitive     * STAPHYLOCOCCUS SPECIES (COAGULASE NEGATIVE)  Blood Culture ID Panel (Reflexed)     Status: Abnormal   Collection Time: 03/10/16  2:00 PM  Result Value  Ref Range Status   Enterococcus species NOT DETECTED NOT DETECTED Final   Vancomycin resistance NOT DETECTED NOT DETECTED Final   Listeria monocytogenes NOT DETECTED NOT DETECTED Final   Staphylococcus species DETECTED (A) NOT DETECTED Final    Comment: CRITICAL RESULT CALLED TO, READ BACK BY AND VERIFIED WITH: J. Frens Pharm.D. 11:15 03/11/16 ([email or web address])    Staphylococcus aureus NOT DETECTED NOT DETECTED Final   Methicillin resistance DETECTED (A) NOT DETECTED Final    Comment: CRITICAL RESULT CALLED TO, READ BACK BY AND VERIFIED WITH: J. Frens Pharm.D. 11:15 03/11/16 ([email or web address])    Streptococcus species NOT DETECTED NOT DETECTED Final   Streptococcus agalactiae NOT DETECTED NOT DETECTED Final   Streptococcus pneumoniae NOT DETECTED NOT DETECTED Final   Streptococcus pyogenes NOT DETECTED NOT DETECTED Final   Acinetobacter baumannii NOT DETECTED NOT DETECTED Final   Enterobacteriaceae species NOT DETECTED NOT DETECTED Final   Enterobacter cloacae complex NOT DETECTED NOT DETECTED Final   Escherichia coli NOT DETECTED NOT DETECTED Final   Klebsiella oxytoca NOT DETECTED NOT DETECTED Final   Klebsiella pneumoniae NOT DETECTED NOT DETECTED Final   Proteus species NOT DETECTED NOT DETECTED Final   Serratia marcescens NOT DETECTED NOT DETECTED Final   Carbapenem resistance NOT DETECTED NOT DETECTED Final   Haemophilus influenzae NOT DETECTED NOT DETECTED Final   Neisseria meningitidis NOT DETECTED NOT DETECTED Final   Pseudomonas aeruginosa NOT DETECTED NOT DETECTED Final   Candida albicans NOT DETECTED NOT DETECTED Final   Candida glabrata NOT DETECTED NOT DETECTED Final   Candida krusei  NOT DETECTED NOT DETECTED Final   Candida parapsilosis NOT DETECTED NOT DETECTED Final   Candida tropicalis NOT DETECTED NOT DETECTED Final     Labs: Basic Metabolic Panel:  Recent Labs Lab 03/09/16 0504 03/10/16 0406 03/11/16 0241 03/12/16 0340 03/13/16 0409 03/14/16 0555 03/15/16 0330  NA  130* 130* 127* 126* 129* 131* 131*  K 2.8* 3.7 4.7 3.9 3.6 3.9 3.5  CL 99* 98* 98* 96* 96* 95* 95*  CO2 '23 26 24 26 28 28 29  '$ GLUCOSE 71 99 79 99 70 82 109*  BUN 7 8 5* 6 <5* <5* <5*  CREATININE 0.40* 0.38* 0.39* 0.41* 0.36* 0.36* <0.30*  CALCIUM 7.8* 7.6* 7.7* 7.5* 7.5* 7.6* 7.6*  MG 1.5* 1.8 1.8  --   --   --   --    Liver Function Tests:  Recent Labs Lab 03/15/16 0330  AST 13*  ALT 8*  ALKPHOS 75  BILITOT 0.1*  PROT 4.3*  ALBUMIN 1.4*   No results for input(s): LIPASE, AMYLASE in the last 168 hours. No results for input(s): AMMONIA in the last 168 hours. CBC:  Recent Labs Lab 03/11/16 0241 03/12/16 0340 03/13/16 0409 03/14/16 0555 03/15/16 0330  WBC 14.3* 20.9* 18.9* 17.2* 16.5*  HGB 7.7* 7.3* 7.4* 7.5* 7.6*  HCT 23.5* 23.1* 22.7* 23.7* 24.3*  MCV 65.6* 66.6* 64.9* 64.6* 66.0*  PLT 354 455* 545* 707* 727*   Cardiac Enzymes: No results for input(s): CKTOTAL, CKMB, CKMBINDEX, TROPONINI in the last 168 hours. BNP: BNP (last 3 results) No results for input(s): BNP in the last 8760 hours.  ProBNP (last 3 results) No results for input(s): PROBNP in the last 8760 hours.  CBG:  Recent Labs Lab 03/10/16 0722 03/11/16 0746 03/13/16 0816 03/14/16 0800 03/15/16 0747  GLUCAP 95 93 90 96 96   Signed:  Irwin Brakeman, MD [facility] Pager [phone]  If 7PM-7AM, please contact night-coverage [email or web address] Password [facility] 03/15/2016, 8:52 AM

## 2016-03-15 NOTE — Progress Notes (Signed)
Pt discharged to home.  Discharge instructions explained to pt.  Demonstrated to husband how to change abdominal wet to dry dressing.  Pt has no questions at the time of discharge.  Pt states she has all belongings.  IV removed.  Pt taken off unit via wheelchair by staff.

## 2016-03-15 NOTE — Progress Notes (Deleted)
Physician Discharge Summary  Carolyn Barton  ZHY:[id number]  DOB: 09-08-1956  DOA: 02/29/2016  PCP: Carolyn Clan, MD  Admit date: 02/29/2016 Discharge date: 03/15/2016  Time spent: 45 minutes  Recommendations for Outpatient Follow-up:  1. Follow up in 3 days at [facility] for arranged procedures with urology (Dr. Patsi Barton) and general surgery.   Discharge Diagnoses:  Principal Problem:   Mass of colon Active Problems:   Essential thrombocytosis (HCC)   Lumbar radiculopathy   Bowel obstruction (HCC)   Nausea & vomiting   Malnutrition of moderate degree   Cellulitis   Leukocytosis  Discharge Condition: Improved & Stable  Filed Weights   03/13/16 0447 03/14/16 0555 03/15/16 0605  Weight: 158 lb 11.7 oz (72 kg) 158 lb 1.1 oz (71.7 kg) 153 lb 10.6 oz (69.7 kg)   History of present illness:  She reports feeling well until approximately one week prior to hospital admission which she developed the onset of nausea and constipation. She presented to the emergency room 02/29/2016 with nausea/vomiting and abdominal pain. A CT of the abdomen and pelvis on 02/29/2016 revealed nonspecific hypodensities in the liver, unremarkable kidneys. The colon was diffusely filled with fluid and air with evidence of a partial obstruction due to a mass at the distal sigmoid colon. Small adjacent nodules were seen. Small volume of ascites. An NG tube was placed and surgery was consulted. She was taken to the operating room on 03/03/2016. The large and small bowel were dilated consistent with an obstruction. Studding of the pelvic peritoneum was noted adjacent to a large obstructing mass at the proximal rectum. The mass was adherent to the left pelvic sidewall. Chronic serosal tears were noted of the cecum. Frozen sections of 2 peritoneal nodules were consistent with adenocarcinoma. Multiple 1 cm white lesions were noted in the liver consistent with metastatic disease. The left ureter appeared mildly dilated  where it entered the pelvic sidewall at the adherent cancer. The ureter and tumor could not be easily separated. A decision was made to proceed with diversion and not attempt to resect the primary tumor. An end colostomy was created at the descending colon.  The pathology ([id number]) of 2 peritoneal nodules returned as metastatic adenocarcinoma consistent with a colonic primary.  Hospital Course:   Colonic mass, Metastatic Ca. of the rectosigmoid colon  - Obstructing large bowel mass, likely adenocarcinoma with metastasis to liver  - POD# 12 exploratory laparotomy, descending colostomy, mucus fistula by Carolyn Barton 03/03/16 - diet advanced to regular and pt so far tolerating well  - encourage OOB to chair and ambulating with assistance, pt has been doing well from that standpoint and will need HHPT and rolling walker - also encouraged use of spirometer while awake, educated in proper use of spirometer and importance of its use in overall recovery - appreciate surgery team following - they are ok with discharge and coordinated possible port placement in 3 days with Dr. Patsi Barton  Sepsis secondary to Cellulitis 6/12, bacteremia with MRSE - erythema and TTP extending from the outside the wounds site at the umbilical level and towards the left flank area, significantly improved on day of discharge 6/17 - WBC trending down, no fevers and tachycardia resolved  - Treated with Primaxin and Vancomycin, started June 13th, 2017, sending home on oral bactrim DS TID x 10 more days.  - will need at least two weeks ABX total   - repeated blood cultures - no growth to date  Hypotension/tachycardia - resolved - ABX as noted  above   Pleural effusions, anasarca - improving, weight trending down as outlined above - resume home lasix 20 mg PO QD 6/15  Moderate protein calorie malnutrition  - nutritionist consulted - pt tolerating diet well   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 - pain controlled on  chronic home regimen with methadone   Anemia of acute illness - with some post op bleed contributing as well - Hg holding stable at 7.6 after transfusion of 1 unit.    Thrombocytosis, essential  - reactive  - recommend repeating CBC in 3 days  Hypokalemia and hypomagnesemia - supplemented and both electrolytes at target range   N/V - resolved and tolerating diet  Hyponatremia - improving from 126 to 131 prior to discharge - REcommend repeat BMP in 3 days  Urinary retention - Pt will be sent home with foley in place.    - per urology team, planning for L JJ stent in 3 days,  - General surgery is coordinating possible port placement in 3 days with urology procedure (arranged by general surgery team)  DVT prophylaxis:  Lovenox SQ  Code Status: Full Code  Disposition Plan:  Home once cleared by surgical teams  Consultants:   Surgery   Oncology   Urology  Procedures:   Colonic resection  Antibiotics:  Primaxin 6/13 -->6/16  Vancomycin 6/13 -->6/17  Discharge Exam: Pt feels better and feels ready to go home, she understands the follow up at [facility] in 3 days  Filed Vitals:   03/14/16 1450 03/14/16 2149 03/15/16 0605 03/15/16 0612  BP: 122/76 126/70  126/74  Pulse: 94 100  99  Temp: 98.1 F (36.7 C) 98.2 F (36.8 C)  98.2 F (36.8 C)  TempSrc: Oral Oral  Oral  Resp: '18 17  17  '$ Height:      Weight:   153 lb 10.6 oz (69.7 kg)   SpO2: 95% 92%  92%   General exam: more awake and interactive today Respiratory system: mild tachypnea with diminished breath sounds at bases and crackles  Cardiovascular system: S1 & S2 heard, normal rate Gastrointestinal system: non tender, open site looks ok, at the umbilical level and outside the wounds border, erythema much improved almost gone completely  Central nervous system: Alert and oriented. No focal neurological deficits. Extremities: Lower and upper extremity edema improved from yesterday.    Discharge Instructions  Discharge Instructions    Diet - low sodium heart healthy    Complete by:  As directed      Discharge instructions    Complete by:  As directed   Follow up at [facility] on Tuesday for Scheduled procedures with surgery and urology. Return for any problems.     Increase activity slowly    Complete by:  As directed             Medication List    STOP taking these medications        MOVANTIK 25 MG Tabs tablet  Generic drug:  naloxegol oxalate     Na Sulfate-K Sulfate-Mg Sulf 17.5-3.13-1.6 GM/180ML Soln  Commonly known as:  SUPREP BOWEL PREP KIT     vitamin B-12 1000 MCG tablet  Commonly known as:  CYANOCOBALAMIN     Vitamin D (Ergocalciferol) 50000 units Caps capsule  Commonly known as:  DRISDOL      TAKE these medications        DULoxetine 60 MG capsule  Commonly known as:  CYMBALTA  Take 60 mg by mouth daily.  furosemide 20 MG tablet  Commonly known as:  LASIX  Take 20 mg by mouth daily.     HYDROmorphone 4 MG tablet  Commonly known as:  DILAUDID  Take by mouth every 4 (four) hours as needed for severe pain.     methadone 10 MG tablet  Commonly known as:  DOLOPHINE  Take 10 mg by mouth 3 (three) times daily.     sulfamethoxazole-trimethoprim 800-160 MG tablet  Commonly known as:  BACTRIM DS,SEPTRA DS  Take 1 tablet by mouth every 8 (eight) hours.     tamsulosin 0.4 MG Caps capsule  Commonly known as:  FLOMAX  Take 1 capsule (0.4 mg total) by mouth daily.           Follow-up Information    Follow up with CORNETT,THOMAS A., MD.   Specialty:  General Surgery   Why:  our office will call you to schedule a follow up with the surgeon    Contact information:   [place] [address] [phone]       Follow up with Ailene Rud, MD In 3 days.   Specialty:  Urology   Why:  at [facility] for procedures as scheduled and arranged   Contact information:   [place]   [id number] [phone]       Get Medicines reviewed and adjusted: Please take all your medications with you for your next visit with your Primary MD  Please request your Primary MD to go over all hospital tests and procedure/radiological results at the follow up. Please ask your Primary MD to get all Hospital records sent to his/her office.  If you experience worsening of your admission symptoms, develop shortness of breath, life threatening emergency, suicidal or homicidal thoughts you must seek medical attention immediately by calling 911 or calling your MD immediately if symptoms less severe.  You must read complete instructions/literature along with all the possible adverse reactions/side effects for all the Medicines you take and that have been prescribed to you. Take any new Medicines after you have completely understood and accept all the possible adverse reactions/side effects.   Do not drive when taking pain medications.   Do not take more than prescribed Pain, Sleep and Anxiety Medications  Special Instructions: If you have smoked or chewed Tobacco in the last 2 yrs please stop smoking, stop any regular Alcohol and or any Recreational drug use.  Wear Seat belts while driving.  Please note  You were cared for by a hospitalist during your hospital stay. Once you are discharged, your primary care physician will handle any further medical issues. Please note that NO REFILLS for any discharge medications will be authorized once you are discharged, as it is imperative that you return to your primary care physician (or establish a relationship with a primary care physician if you do not have one) for your aftercare needs so that they can reassess your need for medications and monitor your lab values.   The results of significant diagnostics from this hospitalization (including imaging, microbiology, ancillary and laboratory) are listed below for reference.    Significant Diagnostic  Studies: Dg Chest 2 View  03/06/2016  CLINICAL DATA:  Shortness of breath and weakness EXAM: CHEST  2 VIEW COMPARISON:  12/17/2012 FINDINGS: The heart size appears normal. Aortic atherosclerosis noted. Lung volumes are low. There are bilateral pleural effusions identified. Overlying platelike atelectasis identified. IMPRESSION: 1. Low lung volumes and bilateral pleural effusions. Electronically  Signed   By: Signa Kell M.D.   On: 03/06/2016 08:28   Ct Abdomen Pelvis W Contrast  03/10/2016  CLINICAL DATA:  Pt with follow-up on blister, infected, abdominal wall, mass of colon. EXAM: CT ABDOMEN AND PELVIS WITH CONTRAST TECHNIQUE: Multidetector CT imaging of the abdomen and pelvis was performed using the standard protocol following bolus administration of intravenous contrast. CONTRAST:  100 ml ISOVUE-300 IOPAMIDOL (ISOVUE-300) INJECTION 61% COMPARISON:  02/29/2016 FINDINGS: Since the prior exam, patient has undergone surgery with formation of a left lower quadrant colostomy decompressing the bowel obstruction caused by the apparent distal colonic mass. Subcutaneous air surrounds the colostomy. The skin and subcutaneous soft tissues along the anterior abdominal wall incision remains open. The fascia is closed. There are air-fluid levels within the stomach, small bowel and colon consistent with a postoperative adynamic ileus. There is no significant bowel distention. Diffuse edema is seen throughout subcutaneous soft tissues, most evident along the flanks, new since the prior exam. There is no defined fluid collection within the abdomen or the abdominal wall suggest an abscess. Lung bases: Small effusions, right greater than left, with associated lung base atelectasis. Stable hiatal hernia. Hepatobiliary: Subtle liver lesions noted on the prior study are not resolved on this unenhanced scan. Liver unremarkable. Gallbladder is unremarkable. No bile duct dilation. Spleen, pancreas, adrenal glands:  Unremarkable.  Kidneys, ureters, bladder: No renal mass or stone. No hydronephrosis. Ureters not well-defined. Bladder with nondependent air likely from recent instrumentation. Peritoneal cavity: Small amount ascites. Minimal free air likely postoperative in origin. Lymph nodes: No definitive enlarged lymph nodes. Small nodules in the pelvis noted on the prior study are less well-defined. Musculoskeletal: Stable postsurgical changes from L5-S1 fusion. No osteoblastic or osteolytic lesions. IMPRESSION: 1. Since the prior exam, the patient has undergone abdominal surgery with formation of a left lower quadrant colostomy. There is subcutaneous air surrounding the colostomy. There is no significant residual bowel obstruction. Air-fluid levels within the bowel is consistent with a postoperative adynamic ileus. There is also diffuse subcutaneous edema which is new. The skin and subcutaneous soft tissues of the anterior abdominal wall incision are open. The fascia is closed. 2. There is a small amount of ascites, which has increased from the prior exam. 3. Small pleural effusions with lung base atelectasis is new from the prior study. 4. There is no defined fluid collection to suggest an abscess. Electronically Signed   By: Amie Portland M.D.   On: 03/10/2016 21:00   Ct Abdomen Pelvis W Contrast  02/29/2016  CLINICAL DATA:  Acute onset of constipation, nausea and vomiting. Initial encounter. EXAM: CT ABDOMEN AND PELVIS WITH CONTRAST TECHNIQUE: Multidetector CT imaging of the abdomen and pelvis was performed using the standard protocol following bolus administration of intravenous contrast. CONTRAST:  ISOVUE-300 IOPAMIDOL (ISOVUE-300) INJECTION 61% COMPARISON:  CT of the lumbar spine performed 01/25/2014, and abdominal radiograph performed earlier today at 2:46 p.m. FINDINGS: The visualized lung bases are clear. A moderate hiatal hernia is noted. The patient's enteric tube is seen coiled within the hiatal hernia, ending about the  superior aspect of the hernia. Small volume ascites is noted within the abdomen and pelvis. Nonspecific hypodensities are seen within the liver, measuring up to 2.2 cm in size. Metastatic disease cannot be excluded. The spleen is unremarkable in appearance. The gallbladder is within normal limits. The pancreas and adrenal glands are unremarkable. The kidneys are unremarkable in appearance. There is no evidence of hydronephrosis. No renal or ureteral stones are  seen. No perinephric stranding is appreciated. No free fluid is identified. The small bowel is unremarkable in appearance. The stomach is within normal limits. No acute vascular abnormalities are seen. The appendix is not definitely characterized. The colon is diffusely filled with fluid and air. There is interposition of the hepatic flexure of the colon anterior to the liver. This appears to reflect partial obstruction due to a mass at the distal sigmoid colon, measuring approximately 6.6 x 3.2 x 3.8 cm. Small adjacent nodules are seen, concerning for local spread of disease. No pelvic sidewall lymphadenopathy is seen. The bladder is mildly distended and grossly unremarkable in appearance. A 1.7 cm nodule posterior to the uterus may simply reflect an exophytic fibroid. The uterus is otherwise unremarkable. The ovaries are relatively symmetric. No suspicious adnexal masses are seen. No inguinal lymphadenopathy is seen. No acute osseous abnormalities are identified. The patient is status post lumbar spinal fusion at L5-S1. IMPRESSION: 1. Colonic mass noted at the distal sigmoid colon, measuring 6.6 x 3.2 x 3.8 cm, resulting in partial obstruction. The colon is diffusely filled with fluid and air, more proximally. Findings compatible with primary colonic malignancy. 2. Small adjacent nodules seen, concerning for local spread of disease. No pelvic sidewall lymphadenopathy seen. 3. Small volume ascites within the abdomen and pelvis. 4. Nonspecific hypodensities  within the liver, measuring up to 2.2 cm in size. Metastatic disease cannot be excluded. Would correlate with LFTs, and consider further evaluation as deemed clinically appropriate. 5. Moderate hiatal hernia seen. 6. Enteric tube noted coiled within the hiatal hernia, ending about the superior aspect of the hernia. 7. 1.7 cm nodule posterior to the uterus may simply reflect an exophytic fibroid. Electronically Signed   By: Garald Balding M.D.   On: 02/29/2016 22:18   Dg Abd 2 Views  03/02/2016  ADDENDUM REPORT: 03/02/2016 12:40 ADDENDUM: NG tube is buckled at the side port and looped back on itself in the distal esophagus. Electronically Signed   By: Rolm Baptise M.D.   On: 03/02/2016 12:40  03/02/2016  CLINICAL DATA:  Bowel obstruction EXAM: ABDOMEN - 2 VIEW COMPARISON:  02/29/2016 FINDINGS: Again noted are dilated large and small bowel loops as seen on prior study, unchanged, concerning for distal obstruction. No free air organomegaly. IMPRESSION: Stable distal obstruction pattern. Electronically Signed: By: Rolm Baptise M.D. On: 03/02/2016 11:44   Dg Abd 2 Views  02/29/2016  CLINICAL DATA:  Constipation and vomit EXAM: ABDOMEN - 2 VIEW COMPARISON:  None. FINDINGS: Supine and upright images obtained. There is generalized bowel dilatation with multiple air-fluid levels. No free air evident. Postoperative changes noted at L5 and S1. There is atelectatic change in lung bases. IMPRESSION: Bowel gas pattern is consistent with obstruction. No free air evident. Atelectasis lung bases, slightly more on the right than on the left. Electronically Signed   By: Lowella Grip III M.D.   On: 02/29/2016 15:15    Microbiology: Recent Results (from the past 240 hour(s))  Urine culture     Status: None   Collection Time: 03/06/16  1:57 AM  Result Value Ref Range Status   Specimen Description URINE, CATHETERIZED  Final   Special Requests NONE  Final   Culture NO GROWTH  Final   Report Status 03/07/2016 FINAL   Final  Culture, blood (routine x 2)     Status: Abnormal   Collection Time: 03/10/16  1:45 PM  Result Value Ref Range Status   Specimen Description BLOOD LEFT FOREARM  Final  Special Requests BOTTLES DRAWN AEROBIC AND ANAEROBIC 10CC  Final   Culture  Setup Time   Final    GRAM POSITIVE COCCI IN CLUSTERS IN BOTH AEROBIC AND ANAEROBIC BOTTLES CRITICAL RESULT CALLED TO, READ BACK BY AND VERIFIED WITH: C. STEWART, PHARM D AT 40 ON [id number] BY Rhea Bleacher    Culture (A)  Final    STAPHYLOCOCCUS SPECIES (COAGULASE NEGATIVE) SUSCEPTIBILITIES PERFORMED ON PREVIOUS CULTURE WITHIN THE LAST 5 DAYS.    Report Status 03/13/2016 FINAL  Final  Culture, blood (routine x 2)     Status: Abnormal   Collection Time: 03/10/16  2:00 PM  Result Value Ref Range Status   Specimen Description BLOOD LEFT HAND  Final   Special Requests BOTTLES DRAWN AEROBIC AND ANAEROBIC 10CC  Final   Culture  Setup Time   Final    GRAM POSITIVE COCCI IN CLUSTERS IN BOTH AEROBIC AND ANAEROBIC BOTTLES CRITICAL RESULT CALLED TO, READ BACK BY AND VERIFIED WITH: J. Darleen Crocker D AT 1115 ON [id number] BY M. WILSON    Culture STAPHYLOCOCCUS SPECIES (COAGULASE NEGATIVE) (A)  Final   Report Status 03/13/2016 FINAL  Final   Organism ID, Bacteria STAPHYLOCOCCUS SPECIES (COAGULASE NEGATIVE)  Final      Susceptibility   Staphylococcus species (coagulase negative) - MIC*    CIPROFLOXACIN <=0.5 SENSITIVE Sensitive     ERYTHROMYCIN <=0.25 SENSITIVE Sensitive     GENTAMICIN <=0.5 SENSITIVE Sensitive     OXACILLIN >=4 RESISTANT Resistant     TETRACYCLINE <=1 SENSITIVE Sensitive     VANCOMYCIN 1 SENSITIVE Sensitive     TRIMETH/SULFA <=10 SENSITIVE Sensitive     CLINDAMYCIN <=0.25 SENSITIVE Sensitive     RIFAMPIN <=0.5 SENSITIVE Sensitive     Inducible Clindamycin NEGATIVE Sensitive     * STAPHYLOCOCCUS SPECIES (COAGULASE NEGATIVE)  Blood Culture ID Panel (Reflexed)     Status: Abnormal   Collection Time: 03/10/16  2:00 PM  Result Value  Ref Range Status   Enterococcus species NOT DETECTED NOT DETECTED Final   Vancomycin resistance NOT DETECTED NOT DETECTED Final   Listeria monocytogenes NOT DETECTED NOT DETECTED Final   Staphylococcus species DETECTED (A) NOT DETECTED Final    Comment: CRITICAL RESULT CALLED TO, READ BACK BY AND VERIFIED WITH: J. Frens Pharm.D. 11:15 03/11/16 ([email or web address])    Staphylococcus aureus NOT DETECTED NOT DETECTED Final   Methicillin resistance DETECTED (A) NOT DETECTED Final    Comment: CRITICAL RESULT CALLED TO, READ BACK BY AND VERIFIED WITH: J. Frens Pharm.D. 11:15 03/11/16 ([email or web address])    Streptococcus species NOT DETECTED NOT DETECTED Final   Streptococcus agalactiae NOT DETECTED NOT DETECTED Final   Streptococcus pneumoniae NOT DETECTED NOT DETECTED Final   Streptococcus pyogenes NOT DETECTED NOT DETECTED Final   Acinetobacter baumannii NOT DETECTED NOT DETECTED Final   Enterobacteriaceae species NOT DETECTED NOT DETECTED Final   Enterobacter cloacae complex NOT DETECTED NOT DETECTED Final   Escherichia coli NOT DETECTED NOT DETECTED Final   Klebsiella oxytoca NOT DETECTED NOT DETECTED Final   Klebsiella pneumoniae NOT DETECTED NOT DETECTED Final   Proteus species NOT DETECTED NOT DETECTED Final   Serratia marcescens NOT DETECTED NOT DETECTED Final   Carbapenem resistance NOT DETECTED NOT DETECTED Final   Haemophilus influenzae NOT DETECTED NOT DETECTED Final   Neisseria meningitidis NOT DETECTED NOT DETECTED Final   Pseudomonas aeruginosa NOT DETECTED NOT DETECTED Final   Candida albicans NOT DETECTED NOT DETECTED Final   Candida glabrata NOT DETECTED NOT DETECTED Final   Candida krusei  NOT DETECTED NOT DETECTED Final   Candida parapsilosis NOT DETECTED NOT DETECTED Final   Candida tropicalis NOT DETECTED NOT DETECTED Final     Labs: Basic Metabolic Panel:  Recent Labs Lab 03/09/16 0504 03/10/16 0406 03/11/16 0241 03/12/16 0340 03/13/16 0409 03/14/16 0555 03/15/16 0330  NA  130* 130* 127* 126* 129* 131* 131*  K 2.8* 3.7 4.7 3.9 3.6 3.9 3.5  CL 99* 98* 98* 96* 96* 95* 95*  CO2 '23 26 24 26 28 28 29  '$ GLUCOSE 71 99 79 99 70 82 109*  BUN 7 8 5* 6 <5* <5* <5*  CREATININE 0.40* 0.38* 0.39* 0.41* 0.36* 0.36* <0.30*  CALCIUM 7.8* 7.6* 7.7* 7.5* 7.5* 7.6* 7.6*  MG 1.5* 1.8 1.8  --   --   --   --    Liver Function Tests:  Recent Labs Lab 03/15/16 0330  AST 13*  ALT 8*  ALKPHOS 75  BILITOT 0.1*  PROT 4.3*  ALBUMIN 1.4*   No results for input(s): LIPASE, AMYLASE in the last 168 hours. No results for input(s): AMMONIA in the last 168 hours. CBC:  Recent Labs Lab 03/11/16 0241 03/12/16 0340 03/13/16 0409 03/14/16 0555 03/15/16 0330  WBC 14.3* 20.9* 18.9* 17.2* 16.5*  HGB 7.7* 7.3* 7.4* 7.5* 7.6*  HCT 23.5* 23.1* 22.7* 23.7* 24.3*  MCV 65.6* 66.6* 64.9* 64.6* 66.0*  PLT 354 455* 545* 707* 727*   Cardiac Enzymes: No results for input(s): CKTOTAL, CKMB, CKMBINDEX, TROPONINI in the last 168 hours. BNP: BNP (last 3 results) No results for input(s): BNP in the last 8760 hours.  ProBNP (last 3 results) No results for input(s): PROBNP in the last 8760 hours.  CBG:  Recent Labs Lab 03/10/16 0722 03/11/16 0746 03/13/16 0816 03/14/16 0800 03/15/16 0747  GLUCAP 95 93 90 96 96   Signed:  Irwin Brakeman, MD [facility] Pager [phone]  If 7PM-7AM, please contact night-coverage [email or web address] Password [facility] 03/15/2016, 8:52 AM

## 2016-03-15 NOTE — Progress Notes (Signed)
12 Days Post-Op  Subjective: Pt doing OK this AM tol PO well  Objective: Vital signs in last 24 hours: Temp:  [98.1 F (36.7 C)-98.2 F (36.8 C)] 98.2 F (36.8 C) (06/17 0612) Pulse Rate:  [94-100] 99 (06/17 0612) Resp:  [17-18] 17 (06/17 0612) BP: (122-126)/(70-76) 126/74 mmHg (06/17 0612) SpO2:  [92 %-95 %] 92 % (06/17 0612) Weight:  [69.7 kg (153 lb 10.6 oz)] 69.7 kg (153 lb 10.6 oz) (06/17 0605) Last BM Date: 03/14/16  Intake/Output from previous day: 06/16 0701 - 06/17 0700 In: 540 [P.O.:540] Out: 3610 [Urine:3450; Stool:160] Intake/Output this shift:    General appearance: alert and cooperative GI: ostomy patent, m/l wound c/d/i and packed  Lab Results:   Recent Labs  03/14/16 0555 03/15/16 0330  WBC 17.2* 16.5*  HGB 7.5* 7.6*  HCT 23.7* 24.3*  PLT 707* 727*   BMET  Recent Labs  03/14/16 0555 03/15/16 0330  NA 131* 131*  K 3.9 3.5  CL 95* 95*  CO2 28 29  GLUCOSE 82 109*  BUN <5* <5*  CREATININE 0.36* <0.30*  CALCIUM 7.6* 7.6*    Anti-infectives: Anti-infectives    Start     Dose/Rate Route Frequency Ordered Stop   03/15/16 0800  ciprofloxacin (CIPRO) tablet 500 mg     500 mg Oral 2 times daily 03/15/16 0754     03/12/16 0400  vancomycin (VANCOCIN) IVPB 1000 mg/200 mL premix  Status:  Discontinued     1,000 mg 200 mL/hr over 60 Minutes Intravenous Every 12 hours 03/11/16 1513 03/15/16 0755   03/11/16 1600  vancomycin (VANCOCIN) 1,500 mg in sodium chloride 0.9 % 500 mL IVPB     1,500 mg 250 mL/hr over 120 Minutes Intravenous  Once 03/11/16 1513 03/11/16 2041   03/11/16 0900  imipenem-cilastatin (PRIMAXIN) 500 mg in sodium chloride 0.9 % 100 mL IVPB  Status:  Discontinued     500 mg 200 mL/hr over 30 Minutes Intravenous Every 6 hours 03/11/16 0738 03/14/16 1347   03/10/16 1400  clindamycin (CLEOCIN) IVPB 600 mg  Status:  Discontinued     600 mg 100 mL/hr over 30 Minutes Intravenous Every 8 hours 03/10/16 1104 03/11/16 0738   03/10/16 1145   cefTRIAXone (ROCEPHIN) 1 g in dextrose 5 % 50 mL IVPB  Status:  Discontinued     1 g 100 mL/hr over 30 Minutes Intravenous Every 24 hours 03/10/16 1138 03/10/16 1144   03/03/16 0846  cefoTEtan in Dextrose 5% (CEFOTAN) 2-2.08 GM-% IVPB    Comments:  Forte, Lindsi   : cabinet override      03/03/16 0846 03/03/16 2059   03/03/16 0600  cefoTEtan (CEFOTAN) 2 g in dextrose 5 % 50 mL IVPB    Comments:  In OR holding pre op   2 g 100 mL/hr over 30 Minutes Intravenous On call to O.R. 03/02/16 1054 03/03/16 1009   03/02/16 1000  cefoTEtan (CEFOTAN) 2 g in dextrose 5 % 50 mL IVPB  Status:  Discontinued    Comments:  In OR holding pre op   2 g 100 mL/hr over 30 Minutes Intravenous Every 12 hours 03/02/16 0959 03/02/16 1054      Assessment/Plan: s/p Procedure(s): EXPLORATORY LAPAROTOMY (N/A) BIOPSY OF PERITONEAL NODULE (N/A) DIVERTING SIGMOID COLOSTOMY (N/A) Pt tol PO well OK for DC from surgical standpoint Would con't Abx at home, cipro or bactrim OK for cellulitis  Appears to be set up for Tues to undergo PAC placement and ureteral stent placement.  LOS: 14 days    Rosario Jacks., Anne Hahn 03/15/2016

## 2016-03-15 NOTE — Discharge Instructions (Signed)
ABDOMINAL SURGERY: POST OP INSTRUCTIONS ° °1. DIET: Follow a light bland diet the first 24 hours after arrival home, such as soup, liquids, crackers, etc.  Be sure to include lots of fluids daily.  Avoid fast food or heavy meals as your are more likely to get nauseated.  Eat a low fat the next few days after surgery.   °2. Take your usually prescribed home medications unless otherwise directed. °3. PAIN CONTROL: °a. Pain is best controlled by a usual combination of three different methods TOGETHER: °i. Ice/Heat °ii. Over the counter pain medication °iii. Prescription pain medication °b. Most patients will experience some swelling and bruising around the incisions.  Ice packs or heating pads (30-60 minutes up to 6 times a day) will help. Use ice for the first few days to help decrease swelling and bruising, then switch to heat to help relax tight/sore spots and speed recovery.  Some people prefer to use ice alone, heat alone, alternating between ice & heat.  Experiment to what works for you.  Swelling and bruising can take several weeks to resolve.   °c. It is helpful to take an over-the-counter pain medication regularly for the first few weeks.  Choose one of the following that works best for you: °i. Naproxen (Aleve, etc)  Two 220mg tabs twice a day °ii. Ibuprofen (Advil, etc) Three 200mg tabs four times a day (every meal & bedtime) °iii. Acetaminophen (Tylenol, etc) 500-650mg four times a day (every meal & bedtime) °d. A  prescription for pain medication (such as oxycodone, hydrocodone, etc) should be given to you upon discharge.  Take your pain medication as prescribed.  °i. If you are having problems/concerns with the prescription medicine (does not control pain, nausea, vomiting, rash, itching, etc), please call us [phone] to see if we need to switch you to a different pain medicine that will work better for you and/or control your side effect better. °ii. If you need a refill on your pain medication,  please contact your pharmacy.  They will contact our office to request authorization. Prescriptions will not be filled after 5 pm or on week-ends. °4. Avoid getting constipated.  Between the surgery and the pain medications, it is common to experience some constipation.  Increasing fluid intake and taking a fiber supplement (such as Metamucil, Citrucel, FiberCon, MiraLax, etc) 1-2 times a day regularly will usually help prevent this problem from occurring.  A mild laxative (prune juice, Milk of Magnesia, MiraLax, etc) should be taken according to package directions if there are no bowel movements after 48 hours.   °5. Watch out for diarrhea.  If you have many loose bowel movements, simplify your diet to bland foods & liquids for a few days.  Stop any stool softeners and decrease your fiber supplement.  Switching to mild anti-diarrheal medications (Kayopectate, Pepto Bismol) can help.  If this worsens or does not improve, please call us. °6. Wash / shower every day.  You may shower over the incision / wound.  Avoid baths until the skin is fully healed.  Continue to shower over incision(s) after the dressing is off. °7. Remove your waterproof bandages 5 days after surgery.  You may leave the incision open to air.  You may replace a dressing/Band-Aid to cover the incision for comfort if you wish. °8. ACTIVITIES as tolerated:   °a. You may resume regular (light) daily activities beginning the next day--such as daily self-care, walking, climbing stairs--gradually increasing activities as tolerated.  If you can   walk 30 minutes without difficulty, it is safe to try more intense activity such as jogging, treadmill, bicycling, low-impact aerobics, swimming, etc. b. Save the most intensive and strenuous activity for last such as sit-ups, heavy lifting, contact sports, etc  Refrain from any heavy lifting or straining until you are off narcotics for pain control.   c. DO NOT PUSH THROUGH PAIN.  Let pain be your guide: If it  hurts to do something, don't do it.  Pain is your body warning you to avoid that activity for another week until the pain goes down. d. You may drive when you are no longer taking prescription pain medication, you can comfortably wear a seatbelt, and you can safely maneuver your car and apply brakes. e. Dennis Bast may have sexual intercourse when it is comfortable.  9. FOLLOW UP in our office a. Please call [facility] at [phone] to set up an appointment to see your surgeon in the office for a follow-up appointment approximately 1-2 weeks after your surgery. b. Make sure that you call for this appointment the day you arrive home to insure a convenient appointment time. 10. IF YOU HAVE DISABILITY OR FAMILY LEAVE FORMS, BRING THEM TO THE OFFICE FOR PROCESSING.  DO NOT GIVE THEM TO YOUR DOCTOR.   WHEN TO CALL us [phone]: 1. Poor pain control 2. Reactions / problems with new medications (rash/itching, nausea, etc)  3. Fever over 101.5 F (38.5 C) 4. Inability to urinate 5. Nausea and/or vomiting 6. Worsening swelling or bruising 7. Continued bleeding from incision. 8. Increased pain, redness, or drainage from the incision  The clinic staff is available to answer your questions during regular business hours (8:30am-5pm).  Please dont hesitate to call and ask to speak to one of our nurses for clinical concerns.   A surgeon from [facility] is always on call at the hospitals   If you have a medical emergency, go to the nearest emergency room or call 911.    [facility], [address], [place], [place], [place]  [id number] ? MAIN: [phone] ? TOLL FREE: [phone] ? FAX [phone] [email or web address]  WOUND CARE  It is important that the wound be kept open.   -Keeping the skin edges apart will allow the wound to gradually heal from the base upwards.   - If the skin edges of the wound close too early, a new fluid pocket can form and  infection can occur. -This is the reason to pack deeper wounds with gauze or ribbon -This is why drained wounds cannot be sewed closed right away  A healthy wound should form a lining of bright red "beefy" granulating tissue that will help shrink the wound and help the edges grow new skin into it.   -A little mucus / yellow discharge is normal (the body's natural way to try and form a scab) and should be gently washed off with soap and water with daily dressing changes.  -Green or foul smelling drainage implies bacterial colonization and can slow wound healing - a short course of antibiotic ointment (3-5 days) can help it clear up.  Call the doctor if it does not improve or worsens  -Avoid use of antibiotic ointments for more than a week as they can slow wound healing over time.    -Sometimes other wound care products will be used to reduce need for dressing changes and/or help clean up dirty wounds -Sometimes the surgeon needs to debride the wound  in the office to remove dead or infected tissue out of the wound so it can heal more quickly and safely.    Change the dressing at least once a day -Wash the wound with mild soap and water gently every day.  It is good to shower or bathe the wound to help it clean out. -Use clean 4x4 gauze for medium/large wounds or ribbon plain NU-gauze for smaller wounds (it does not need to be sterile, just clean) -Keep the raw wound moist with a little saline or KY (saline) gel on the gauze.  -A dry wound will take longer to heal.  -Keep the skin dry around the wound to prevent breakdown and irritation. -Pack the wound down to the base -The goal is to keep the skin apart, not overpack the wound -Use a Q-tip or blunt-tipped kabob stick toothpick to push the gauze down to the base in narrow or deep wounds   -Cover with a clean gauze and tape -paper or Medipore tape tend to be gentle on the skin -rotate the orientation of the tape to avoid repeated stress/trauma on  the skin -using an ACE or Coban wrap on wounds on arms or legs can be used instead.  Complete all antibiotics through the entire prescription to help the infection heal and prevent new places of infection   Returning the see the surgeon is helpful to follow the healing process and help the wound close as fast as possible.    Cellulitis Cellulitis is an infection of the skin and the tissue beneath it. The infected area is usually red and tender. Cellulitis occurs most often in the arms and lower legs.  CAUSES  Cellulitis is caused by bacteria that enter the skin through cracks or cuts in the skin. The most common types of bacteria that cause cellulitis are staphylococci and streptococci. SIGNS AND SYMPTOMS   Redness and warmth.  Swelling.  Tenderness or pain.  Fever. DIAGNOSIS  Your health care provider can usually determine what is wrong based on a physical exam. Blood tests may also be done. TREATMENT  Treatment usually involves taking an antibiotic medicine. HOME CARE INSTRUCTIONS   Take your antibiotic medicine as directed by your health care provider. Finish the antibiotic even if you start to feel better.  Keep the infected arm or leg elevated to reduce swelling.  Apply a warm cloth to the affected area up to 4 times per day to relieve pain.  Take medicines only as directed by your health care provider.  Keep all follow-up visits as directed by your health care provider. SEEK MEDICAL CARE IF:   You notice red streaks coming from the infected area.  Your red area gets larger or turns dark in color.  Your bone or joint underneath the infected area becomes painful after the skin has healed.  Your infection returns in the same area or another area.  You notice a swollen bump in the infected area.  You develop new symptoms.  You have a fever. SEEK IMMEDIATE MEDICAL CARE IF:   You feel very sleepy.  You develop vomiting or diarrhea.  You have a general ill  feeling (malaise) with muscle aches and pains.   This information is not intended to replace advice given to you by your health care provider. Make sure you discuss any questions you have with your health care provider.   Document Released: 06/25/2005 Document Revised: 06/06/2015 Document Reviewed: 12/01/2011 Elsevier Interactive Patient Education 2016 [facility]  A colostomy surgery is a procedure that redirects a section of the large intestine (colon) to an opening in the abdomen. This opening is called a stoma or ostomy. A bag is attached to the stoma on the outside of the body. This bag collects waste, since the waste can no longer travel through the rest of the colon. Where the stoma is located and what it looks like depends on the type of colostomy performed. A colostomy may be temporary or permanent. The hospital stay after this procedure is typically 3-7 days. LET [facility] CARE PROVIDER KNOW ABOUT:  Allergies to food or medicine.  Medicines taken, including vitamins, health supplements, herbs, eye drops, over-the-counter medicines, and creams.  Use of steroids (by mouth or creams).  Previous problems with anesthetics or numbing medicines.  History of bleeding problems or blood clots.  Previous surgery.  Other health problems, including diabetes and kidney problems.  Possibility of pregnancy, if this applies. RISKS AND COMPLICATIONS General surgical complications may include:  Reaction to anesthetics.  Damage to surrounding nerves, tissues, or structures.  Infection.  Blood clot.  Bleeding.  Scarring.  Pain that lasts longer than 3 months. Specific risks for colostomy, while rare, may include:  Intestinal blockage.  Skin irritation.  Wound opening.  Narrowing or collapsing of the stoma.  Hernia. BEFORE THE PROCEDURE It is important to follow your health care provider's instructions prior to your procedure to avoid  complications. Steps before your procedure may include:  A physical exam, blood and urine tests, stool test, X-rays, and other procedures.  Chemotherapy or radiation therapy.  A review of the procedure, the anesthetic being used, and what to expect after the procedure. You may meet with an ostomy advisor. You may be asked to:  Stop taking certain medicines for several days prior to your procedure such as blood thinners (including aspirin).  Take certain medicines, such as antibiotics or stool softeners.  Follow a special diet for several days prior to the procedure and to avoid eating and drinking after midnight the night before the procedure. This will help you to avoid complications from the anesthetic.  Take an antibacterial shower the night before, or the morning of, the procedure.  Quit smoking. Smoking increases the chances of an infection or a healing problem after your procedure. Arrange for someone to drive you home after surgery. You should also arrange to have someone help you with activities while you recover. PROCEDURE There are several types of colostomy procedures. The 2 main procedure types are loop colostomy or end colostomy. During a loop colostomy, the surgeon pulls 2 ends of the intestine out toward the abdomen, using 2 openings. During an end colostomy, the surgeon pulls 1 end of the intestine out toward the abdomen, through 1 opening. You will be given medicine that makes you sleep (general anesthetic).The procedure may be done as open surgery, with a large cut (incision), or as laparoscopic surgery, with several small incisions. AFTER THE PROCEDURE  You will be given pain medicine.  You may be able to suck on ice. You may begin drinking clear fluids the next day and begin a normal diet after 2 days, or as directed by your health care provider.  Your stoma will be covered with bandages or a pouch.  Initial drainage from the stoma will be liquid.  The stoma may be  dark-colored, swollen, and bruised until it has more time to heal.   This information is not intended to replace advice given to you  by your health care provider. Make sure you discuss any questions you have with your health care provider.   Document Released: 02/05/2011 Document Revised: 01/30/2015 Document Reviewed: 02/05/2011 Elsevier Interactive Patient Education [employer].

## 2016-03-17 ENCOUNTER — Encounter (HOSPITAL_COMMUNITY): Payer: Self-pay | Admitting: *Deleted

## 2016-03-17 ENCOUNTER — Telehealth: Payer: Self-pay | Admitting: *Deleted

## 2016-03-17 NOTE — Telephone Encounter (Signed)
Oncology Nurse Navigator Documentation  Oncology Nurse Navigator Flowsheets 03/17/2016  Navigator Location [facility]  Navigator Encounter Type Telephone  Telephone Outgoing Call;Appt Confirmation/Clarification  Abnormal Finding Date -  Confirmed Diagnosis Date -  Surgery Date -  Left VM on mobile and home # to confirm she is up to coming tomorrow at 2pm for her new patient appointment or does she wish to reschedule. Left direct # for nurse navigator.

## 2016-03-17 NOTE — Telephone Encounter (Signed)
Message from [place] at [facility]: Pt is scheduled for port and JJ stent on 6/20. Should office visit be rescheduled? Reviewed with Dr. Benay Spice: Schedule for 6/22 @ 4 or 6/26 @ 4.  Inbasket sent to new pt scheduler. Spoke with pt, she requested Thursday appt.

## 2016-03-18 ENCOUNTER — Ambulatory Visit (HOSPITAL_COMMUNITY): Payer: BLUE CROSS/BLUE SHIELD | Admitting: Anesthesiology

## 2016-03-18 ENCOUNTER — Encounter (HOSPITAL_COMMUNITY)
Admission: RE | Disposition: A | Discharge status: Home / Self Care | Payer: Self-pay | Source: Ambulatory Visit | Attending: General Surgery

## 2016-03-18 ENCOUNTER — Ambulatory Visit (HOSPITAL_COMMUNITY): Payer: BLUE CROSS/BLUE SHIELD

## 2016-03-18 ENCOUNTER — Ambulatory Visit: Payer: BLUE CROSS/BLUE SHIELD | Admitting: Oncology

## 2016-03-18 ENCOUNTER — Encounter (HOSPITAL_COMMUNITY): Payer: Self-pay | Admitting: *Deleted

## 2016-03-18 ENCOUNTER — Ambulatory Visit (HOSPITAL_COMMUNITY)
Admission: RE | Admit: 2016-03-18 | Discharge: 2016-03-18 | Disposition: A | Discharge status: Home / Self Care | Payer: BLUE CROSS/BLUE SHIELD | Source: Ambulatory Visit | Attending: General Surgery | Admitting: General Surgery

## 2016-03-18 DIAGNOSIS — Z01818 Encounter for other preprocedural examination: Secondary | ICD-10-CM

## 2016-03-18 DIAGNOSIS — Z87891 Personal history of nicotine dependence: Secondary | ICD-10-CM | POA: Diagnosis not present

## 2016-03-18 DIAGNOSIS — Z933 Colostomy status: Secondary | ICD-10-CM | POA: Insufficient documentation

## 2016-03-18 DIAGNOSIS — N135 Crossing vessel and stricture of ureter without hydronephrosis: Secondary | ICD-10-CM | POA: Diagnosis present

## 2016-03-18 DIAGNOSIS — C19 Malignant neoplasm of rectosigmoid junction: Secondary | ICD-10-CM | POA: Diagnosis not present

## 2016-03-18 DIAGNOSIS — F329 Major depressive disorder, single episode, unspecified: Secondary | ICD-10-CM | POA: Insufficient documentation

## 2016-03-18 DIAGNOSIS — C787 Secondary malignant neoplasm of liver and intrahepatic bile duct: Secondary | ICD-10-CM | POA: Diagnosis not present

## 2016-03-18 DIAGNOSIS — Z79899 Other long term (current) drug therapy: Secondary | ICD-10-CM | POA: Diagnosis not present

## 2016-03-18 DIAGNOSIS — N1330 Unspecified hydronephrosis: Secondary | ICD-10-CM

## 2016-03-18 DIAGNOSIS — C7919 Secondary malignant neoplasm of other urinary organs: Secondary | ICD-10-CM | POA: Diagnosis not present

## 2016-03-18 DIAGNOSIS — C800 Disseminated malignant neoplasm, unspecified: Secondary | ICD-10-CM

## 2016-03-18 DIAGNOSIS — C786 Secondary malignant neoplasm of retroperitoneum and peritoneum: Secondary | ICD-10-CM | POA: Diagnosis not present

## 2016-03-18 HISTORY — PX: CYSTOSCOPY WITH RETROGRADE PYELOGRAM, URETEROSCOPY AND STENT PLACEMENT: SHX5789

## 2016-03-18 LAB — BASIC METABOLIC PANEL
ANION GAP: 9 (ref 5–15)
BUN: <5 mg/dL — ABNORMAL LOW (ref 6–20)
CHLORIDE: 95 mmol/L — AB (ref 101–111)
CO2: 28 mmol/L (ref 22–32)
Calcium: 8.4 mg/dL — ABNORMAL LOW (ref 8.9–10.3)
Creatinine, Ser: <0.3 mg/dL — ABNORMAL LOW (ref 0.44–1.00)
GLUCOSE: 115 mg/dL — AB (ref 65–99)
Potassium: 5.2 mmol/L — ABNORMAL HIGH (ref 3.5–5.1)
Sodium: 132 mmol/L — ABNORMAL LOW (ref 135–145)

## 2016-03-18 LAB — CBC
HEMATOCRIT: 28.9 % — AB (ref 36.0–46.0)
HEMOGLOBIN: 9.3 g/dL — AB (ref 12.0–15.0)
MCH: 21.2 pg — ABNORMAL LOW (ref 26.0–34.0)
MCHC: 32.2 g/dL (ref 30.0–36.0)
MCV: 65.8 fL — AB (ref 78.0–100.0)
Platelets: 955 10*3/uL (ref 150–400)
RBC: 4.39 MIL/uL (ref 3.87–5.11)
RDW: 17.4 % — ABNORMAL HIGH (ref 11.5–15.5)
WBC: 16.7 10*3/uL — AB (ref 4.0–10.5)

## 2016-03-18 LAB — PATHOLOGIST SMEAR REVIEW

## 2016-03-18 SURGERY — CYSTOURETEROSCOPY, WITH RETROGRADE PYELOGRAM AND STENT INSERTION
Anesthesia: General | Laterality: Left

## 2016-03-18 MED ORDER — PHENYLEPHRINE 40 MCG/ML (10ML) SYRINGE FOR IV PUSH (FOR BLOOD PRESSURE SUPPORT)
PREFILLED_SYRINGE | INTRAVENOUS | Status: DC | PRN
  Administered 2016-03-18 (×3): 80 ug via INTRAVENOUS

## 2016-03-18 MED ORDER — URIBEL 118 MG PO CAPS: 1.0000 | ORAL_CAPSULE | Freq: Three times a day (TID) | ORAL | Status: DC | PRN

## 2016-03-18 MED ORDER — LIDOCAINE HCL (CARDIAC) 20 MG/ML IV SOLN
INTRAVENOUS | Status: DC | PRN
  Administered 2016-03-18: 50 mg via INTRAVENOUS

## 2016-03-18 MED ORDER — CHLORHEXIDINE GLUCONATE CLOTH 2 % EX PADS: 6.0000 | MEDICATED_PAD | Freq: Once | CUTANEOUS | Status: DC

## 2016-03-18 MED ORDER — 0.9 % SODIUM CHLORIDE (POUR BTL) OPTIME
TOPICAL | Status: DC | PRN
  Administered 2016-03-18: 1000 mL

## 2016-03-18 MED ORDER — FENTANYL CITRATE (PF) 100 MCG/2ML IJ SOLN: 25.0000 ug | INTRAMUSCULAR | Status: DC | PRN

## 2016-03-18 MED ORDER — BELLADONNA ALKALOIDS-OPIUM 16.2-60 MG RE SUPP
RECTAL | Status: AC
  Filled 2016-03-18: qty 1

## 2016-03-18 MED ORDER — MIDAZOLAM HCL 2 MG/2ML IJ SOLN: 0.5000 mg | Freq: Once | INTRAMUSCULAR | Status: DC | PRN

## 2016-03-18 MED ORDER — GLYCOPYRROLATE 0.2 MG/ML IJ SOLN
INTRAMUSCULAR | Status: AC
  Filled 2016-03-18: qty 1

## 2016-03-18 MED ORDER — CEFAZOLIN SODIUM-DEXTROSE 2-4 GM/100ML-% IV SOLN
INTRAVENOUS | Status: AC
  Filled 2016-03-18: qty 100

## 2016-03-18 MED ORDER — ONDANSETRON HCL 4 MG/2ML IJ SOLN
INTRAMUSCULAR | Status: DC | PRN
  Administered 2016-03-18: 4 mg via INTRAVENOUS

## 2016-03-18 MED ORDER — ONDANSETRON HCL 4 MG/2ML IJ SOLN
INTRAMUSCULAR | Status: AC
  Filled 2016-03-18: qty 2

## 2016-03-18 MED ORDER — MIDAZOLAM HCL 5 MG/5ML IJ SOLN
INTRAMUSCULAR | Status: DC | PRN
  Administered 2016-03-18: 2 mg via INTRAVENOUS

## 2016-03-18 MED ORDER — PROPOFOL 10 MG/ML IV BOLUS
INTRAVENOUS | Status: AC
  Filled 2016-03-18: qty 40

## 2016-03-18 MED ORDER — SODIUM CHLORIDE 0.9 % IV SOLN
Freq: Once | INTRAVENOUS | Status: DC
  Filled 2016-03-18: qty 1.2

## 2016-03-18 MED ORDER — TRAMADOL-ACETAMINOPHEN 37.5-325 MG PO TABS: 1.0000 | ORAL_TABLET | Freq: Four times a day (QID) | ORAL | Status: DC | PRN

## 2016-03-18 MED ORDER — HEPARIN SOD (PORK) LOCK FLUSH 100 UNIT/ML IV SOLN
INTRAVENOUS | Status: AC
  Filled 2016-03-18: qty 5

## 2016-03-18 MED ORDER — DEXAMETHASONE SODIUM PHOSPHATE 10 MG/ML IJ SOLN
INTRAMUSCULAR | Status: AC
  Filled 2016-03-18: qty 1

## 2016-03-18 MED ORDER — LIDOCAINE HCL 2 % EX GEL
CUTANEOUS | Status: AC
  Filled 2016-03-18: qty 5

## 2016-03-18 MED ORDER — FENTANYL CITRATE (PF) 250 MCG/5ML IJ SOLN
INTRAMUSCULAR | Status: AC
  Filled 2016-03-18: qty 5

## 2016-03-18 MED ORDER — LIDOCAINE HCL (CARDIAC) 20 MG/ML IV SOLN
INTRAVENOUS | Status: AC
  Filled 2016-03-18: qty 5

## 2016-03-18 MED ORDER — KETOROLAC TROMETHAMINE 30 MG/ML IJ SOLN
INTRAMUSCULAR | Status: DC | PRN
  Administered 2016-03-18: 30 mg via INTRAVENOUS

## 2016-03-18 MED ORDER — CEFAZOLIN SODIUM-DEXTROSE 2-4 GM/100ML-% IV SOLN
2.0000 g | INTRAVENOUS | Status: AC
  Administered 2016-03-18: 2 g via INTRAVENOUS
  Filled 2016-03-18: qty 100

## 2016-03-18 MED ORDER — PROMETHAZINE HCL 25 MG/ML IJ SOLN: 6.2500 mg | INTRAMUSCULAR | Status: DC | PRN

## 2016-03-18 MED ORDER — PHENYLEPHRINE 40 MCG/ML (10ML) SYRINGE FOR IV PUSH (FOR BLOOD PRESSURE SUPPORT)
PREFILLED_SYRINGE | INTRAVENOUS | Status: AC
  Filled 2016-03-18: qty 10

## 2016-03-18 MED ORDER — FENTANYL CITRATE (PF) 100 MCG/2ML IJ SOLN
INTRAMUSCULAR | Status: DC | PRN
  Administered 2016-03-18: 50 ug via INTRAVENOUS

## 2016-03-18 MED ORDER — MIDAZOLAM HCL 2 MG/2ML IJ SOLN
INTRAMUSCULAR | Status: AC
  Filled 2016-03-18: qty 2

## 2016-03-18 MED ORDER — PROPOFOL 10 MG/ML IV BOLUS
INTRAVENOUS | Status: DC | PRN
  Administered 2016-03-18: 140 mg via INTRAVENOUS

## 2016-03-18 MED ORDER — SODIUM CHLORIDE 0.9 % IV SOLN
INTRAVENOUS | Status: DC | PRN
  Administered 2016-03-18 (×2): via INTRAVENOUS

## 2016-03-18 MED ORDER — IOHEXOL 300 MG/ML  SOLN
INTRAMUSCULAR | Status: DC | PRN
Start: 2016-03-18 — End: 2016-03-18
  Administered 2016-03-18: 5 mL via ORAL

## 2016-03-18 MED ORDER — SODIUM CHLORIDE 0.9 % IR SOLN
Status: DC | PRN
  Administered 2016-03-18: 3000 mL

## 2016-03-18 MED ORDER — LACTATED RINGERS IV SOLN: INTRAVENOUS | Status: DC | PRN

## 2016-03-18 MED ORDER — BUPIVACAINE-EPINEPHRINE 0.25% -1:200000 IJ SOLN
INTRAMUSCULAR | Status: AC
  Filled 2016-03-18: qty 1

## 2016-03-18 MED ORDER — SODIUM CHLORIDE 0.9 % IJ SOLN
INTRAMUSCULAR | Status: AC
  Filled 2016-03-18: qty 10

## 2016-03-18 MED ORDER — EPHEDRINE SULFATE 50 MG/ML IJ SOLN
INTRAMUSCULAR | Status: AC
  Filled 2016-03-18: qty 1

## 2016-03-18 MED ORDER — MEPERIDINE HCL 50 MG/ML IJ SOLN: 6.2500 mg | INTRAMUSCULAR | Status: DC | PRN

## 2016-03-18 MED ORDER — DEXAMETHASONE SODIUM PHOSPHATE 10 MG/ML IJ SOLN
INTRAMUSCULAR | Status: DC | PRN
  Administered 2016-03-18: 10 mg via INTRAVENOUS

## 2016-03-18 MED ORDER — ROCURONIUM BROMIDE 100 MG/10ML IV SOLN
INTRAVENOUS | Status: AC
  Filled 2016-03-18: qty 1

## 2016-03-18 SURGICAL SUPPLY — 51 items
APL SKNCLS STERI-STRIP NONHPOA (GAUZE/BANDAGES/DRESSINGS)
BAG DECANTER FOR FLEXI CONT (MISCELLANEOUS) ×4 IMPLANT
BAG URO CATCHER STRL LF (MISCELLANEOUS) ×4 IMPLANT
BASKET STNLS GEMINI 4WIRE 3FR (BASKET) IMPLANT
BASKET ZERO TIP NITINOL 2.4FR (BASKET) IMPLANT
BENZOIN TINCTURE PRP APPL 2/3 (GAUZE/BANDAGES/DRESSINGS) IMPLANT
BLADE SURG 15 STRL LF DISP TIS (BLADE) ×2 IMPLANT
BLADE SURG 15 STRL SS (BLADE) ×4
BSKT STON RTRVL GEM 120X11 3FR (BASKET)
BSKT STON RTRVL ZERO TP 2.4FR (BASKET)
CATH INTERMIT  6FR 70CM (CATHETERS) ×4 IMPLANT
CATH URET DUAL LUMEN 6-10FR 50 (CATHETERS) ×1 IMPLANT
CHLORAPREP W/TINT 26ML (MISCELLANEOUS) ×1 IMPLANT
CLOSURE WOUND 1/2 X4 (GAUZE/BANDAGES/DRESSINGS)
CLOTH BEACON ORANGE TIMEOUT ST (SAFETY) ×4 IMPLANT
COVER SURGICAL LIGHT HANDLE (MISCELLANEOUS) IMPLANT
DECANTER SPIKE VIAL GLASS SM (MISCELLANEOUS) ×1 IMPLANT
DRAPE C-ARM 42X120 X-RAY (DRAPES) ×4 IMPLANT
DRAPE LAPAROSCOPIC ABDOMINAL (DRAPES) ×4 IMPLANT
ELECT PENCIL ROCKER SW 15FT (MISCELLANEOUS) ×4 IMPLANT
ELECT REM PT RETURN 9FT ADLT (ELECTROSURGICAL)
ELECTRODE REM PT RTRN 9FT ADLT (ELECTROSURGICAL) ×1 IMPLANT
FIBER LASER FLEXIVA 365 (UROLOGICAL SUPPLIES) IMPLANT
FIBER LASER TRAC TIP (UROLOGICAL SUPPLIES) IMPLANT
GAUZE SPONGE 4X4 12PLY STRL (GAUZE/BANDAGES/DRESSINGS) IMPLANT
GAUZE SPONGE 4X4 16PLY XRAY LF (GAUZE/BANDAGES/DRESSINGS) ×4 IMPLANT
GLOVE BIOGEL M STRL SZ7.5 (GLOVE) ×4 IMPLANT
GLOVE BIOGEL PI IND STRL 7.5 (GLOVE) ×2 IMPLANT
GLOVE BIOGEL PI INDICATOR 7.5 (GLOVE) ×2
GLOVE ECLIPSE 7.5 STRL STRAW (GLOVE) ×1 IMPLANT
GOWN STRL REUS W/TWL XL LVL3 (GOWN DISPOSABLE) ×12 IMPLANT
GUIDEWIRE STR DUAL SENSOR (WIRE) ×4 IMPLANT
IV NS 1000ML (IV SOLUTION)
IV NS 1000ML BAXH (IV SOLUTION) ×1 IMPLANT
KIT BASIN OR (CUSTOM PROCEDURE TRAY) ×4 IMPLANT
LIQUID BAND (GAUZE/BANDAGES/DRESSINGS) IMPLANT
MANIFOLD NEPTUNE II (INSTRUMENTS) ×4 IMPLANT
NEEDLE HYPO 22GX1.5 SAFETY (NEEDLE) ×4 IMPLANT
PACK BASIC VI WITH GOWN DISP (CUSTOM PROCEDURE TRAY) ×4 IMPLANT
PACK CYSTO (CUSTOM PROCEDURE TRAY) ×4 IMPLANT
STENT POLARIS LOOP 8FR X 24 CM (STENTS) ×3 IMPLANT
STRIP CLOSURE SKIN 1/2X4 (GAUZE/BANDAGES/DRESSINGS) IMPLANT
SUT MNCRL AB 4-0 PS2 18 (SUTURE) ×4 IMPLANT
SUT PROLENE 2 0 CT2 30 (SUTURE) ×4 IMPLANT
SYR 10ML ECCENTRIC (SYRINGE) ×1 IMPLANT
SYR CONTROL 10ML LL (SYRINGE) ×4 IMPLANT
SYRINGE IRR TOOMEY STRL 70CC (SYRINGE) ×3 IMPLANT
TOWEL OR 17X26 10 PK STRL BLUE (TOWEL DISPOSABLE) ×4 IMPLANT
TOWEL OR NON WOVEN STRL DISP B (DISPOSABLE) ×4 IMPLANT
TUBING CONNECTING 10 (TUBING) ×3 IMPLANT
TUBING CONNECTING 10' (TUBING) ×1

## 2016-03-18 NOTE — Anesthesia Postprocedure Evaluation (Signed)
Anesthesia Post Note  Patient: AZAELIA KINCANNON  Procedure(s) Performed: Procedure(s) (LRB): CYSTOSCOPY WITH LEFT  RETROGRADE PYELOGRAM,  AND  POLARIS STENT PLACEMENT (Left)  Patient location during evaluation: PACU Anesthesia Type: General Level of consciousness: awake and alert, oriented and patient cooperative Pain management: pain level controlled Vital Signs Assessment: post-procedure vital signs reviewed and stable Respiratory status: spontaneous breathing, nonlabored ventilation and respiratory function stable Cardiovascular status: blood pressure returned to baseline and stable Postop Assessment: no signs of nausea or vomiting Anesthetic complications: no    Last Vitals:  Filed Vitals:   03/18/16 0824 03/18/16 0830  BP: 123/92 124/84  Pulse: 98 94  Temp: 36.6 C   Resp: 10 11    Last Pain:  Filed Vitals:   03/18/16 0839  PainSc: 3                  Lorien Shingler,E. Harland Aguiniga

## 2016-03-18 NOTE — Interval H&P Note (Signed)
History and Physical Interval Note:  03/18/2016 8:16 AM  Carolyn Barton  has presented today for surgery, with the diagnosis of METASTASTIC COLON CANCER;LEFT URETERAL OBSTRUCTION  The various methods of treatment have been discussed with the patient and family. After consideration of risks, benefits and other options for treatment, the patient has consented to  Procedure(s): CYSTOSCOPY WITH LEFT  RETROGRADE PYELOGRAM,  [place] (Left) as a surgical intervention .  The patient's history has been reviewed, patient examined, no change in status, stable for surgery.  I have reviewed the patient's chart and labs.  Questions were answered to the patient's satisfaction.     [name] I Rayshell Goecke

## 2016-03-18 NOTE — Discharge Instructions (Signed)
I have reviewed discharge instructions in detail with the patient. They will follow-up with me or their physician as scheduled. My nurse will also be calling the patients as per protocol.   

## 2016-03-18 NOTE — Progress Notes (Signed)
Pt has dsg noted to abdomen from previous surgery.. No drainage noted on dsg at this time

## 2016-03-18 NOTE — H&P (Signed)
Carolyn Clines, MD Physician Signed [phone] Consultation Progress Notes 03/06/2016 8:09 AM  Related encounter: ED to Hosp-Admission (Discharged) from 02/29/2016 in [facility]    Expand All Collapse All    Assessment: 1. Metastatic colon cancer. Awaiting Oncology evaluation and Rx. Discussion with Pt and husband this AM. She will probably need elective Left ureteral stent in future. This could be placed as OP at [facility].   2. Voiding: Foley out. Pt has not voided. 700cc this am. May be too early to be concerned.   Plan: Walk pt , follow pvr's. Consider I/O cath, or repeat foley until pt is up and around.     Subjective: Carolyn Barton is a 60 y.o. female with:  1. abdominal pain with N/V and constipation x 1 week. CT scan revealed a colonic mass at the distal sigmoid colon resulting in partial obstruction compatible with primary colonic malignancy, small adjacent nodules concerning for local spread of disease, non specific hypodensities in the liver, and no hydroureteronephrosis. WBC 11, Cr 0.56 on admission.  . She is now s/p colon resection with colostomy 03/03/16 by Dr. Brantley Stage. During the procedure he noted that the cancer was densely adherent to the left pelvic sidewall where the ureter entered. The two structures could not be easily separated. He documented in his op note that he felt she would likely require a left ureteral stent and chemotherapy due to multiple metastatic sites.  Path:  1. Peritoneum, biopsy, Peritoneal Nodule - METASTATIC ADENOCARCINOMA, CONSISTENT WITH COLONIC PRIMARY. 2. Peritoneum, biopsy, 2nd Peritoneal Nodule - METASTATIC ADENOCARCINOMA, CONSISTENT WITH COLONIC PRIMARY. [name] MD  2. She did not have a foley placed until post operatively at which time she had 1700cc out. Foley out now, but pt has not voided. 700cc by u/s this AM, and pt has no sense of urge to void.  U/S: O: Abd:  tender 2ndary to surgical incision. No bladder palpation A: Pt will get up and walk and try to transfer to toilet to void. ? 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  P: Follow.  3. PMH: 1. lumbar radiculopathy  2. Scoliosis,  3. Depression  4. Thrombocytosis  5. tachycardia Objective: Vital signs in last 24 hours: Temp: [97.6 F (36.4 C)-99 F (37.2 C)] 98.4 F (36.9 C) (06/08 0341) Pulse Rate: [118] 118 (06/08 0343) Resp: [16-36] 23 (06/08 0753) BP: (107-151)/(67-137) 126/69 mmHg (06/08 0343) SpO2: [98 %-100 %] 99 % (06/08 0753) Weight: [72.848 kg (160 lb 9.6 oz)] 72.848 kg (160 lb 9.6 oz) (06/08 0446)A  Intake/Output from previous day: 06/07 0701 - 06/08 0700 In: 1862.8 [I.V.:1573.8; IV Piggyback:289] Out: 1250 [Urine:1100; Emesis/NG output:150] Intake/Output this shift:    Past Medical History  Diagnosis Date  . PONV (postoperative nausea and vomiting)   . Headache(784.0)     not since surgery for spinal leak  . Blood dyscrasia     thrombocytosis not on any meds  . Chronic back pain   . Anemia     History  . Essential thrombocytosis (HCC)     pt. denies this diagnosis  . Tachycardia     Physical Exam:  Lungs - Normal respiratory effort, chest expands symmetrically.  Abdomen - Soft, non-tender & non-distended.  Lab Results:  Recent Labs (last 2 labs)      Recent Labs  03/03/16 2120 03/04/16 0959 03/06/16 0424  WBC 2.8* 11.7* 11.7*  HGB 12.1 13.0 9.0*  HCT 39.9 41.0 29.3*     BMET  Recent  Labs (last 2 labs)      Recent Labs  03/04/16 0959 03/06/16 0424  NA 135 134*  K 4.6 3.9  CL 107 108  CO2 18* 21*  GLUCOSE 97 117*  BUN 12 8  CREATININE 0.66 0.43*  CALCIUM 7.6* 7.8*      Recent Labs (last 2 labs)     No results for input(s): LABURIN in the last 72 hours.   Results for orders placed or  performed during the hospital encounter of 02/29/16  Surgical PCR screen Status: None   Collection Time: 03/03/16 4:00 AM  Result Value Ref Range Status   MRSA, PCR NEGATIVE NEGATIVE Final   Staphylococcus aureus NEGATIVE NEGATIVE Final    Comment:   The Xpert SA Assay (FDA approved for NASAL specimens in patients over 60 years of age), is one component of a comprehensive surveillance program. Test performance has been validated by [facility] for patients greater than or equal to 54 year old. It is not intended to diagnose infection nor to guide or monitor treatment.     Studies/Results: Path as above.   Carolyn Barton 03/06/2016, 8:09 AM

## 2016-03-18 NOTE — H&P (View-Only) (Signed)
Assessment:  Case discussed with Dr. Benay Spice and pt this AM. She will need IV antibiotics for near future, putting off her port line insertion for (approx) 2 weeks.  I am trying to co-ordinate placement of Left JJ stent with GS placement of her port. I will be away June 24-30, and over July 4.   Plan:  If combination surgery with GS, then we will need particular JJ stent from [place] ( 33F, 24cm Polaris) and C-arm for placement at [facility], with 0.038 Guidewire. I will advise my posting scheduler in office to be aware of combination case.  Urology will follow for discharge, and coordinate with GS for surgical planning.     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 Pt will need f/u chemotherapy, and, while she has no hydronephrosis pre-op, is high risk for hydronephrosis 2ndary to external ureteral obstruction from her tumor-which would limit her chemotherapy dosage. Therefore, in consultation with GS ( Dr.Coronett) and Oncology ( Dr. Benay Spice), Urology asked to place Left JJ stent. Hospital course complicated by cellulitis ( gm +m cocci).   In addition, pt has had recurrent urinary retention. She had PVR 70cc last night, without sensation of fullness. Foley catheter replaced.  Now, pt growing staph in blood c/s. For port access for longer term IV antibiotic.   Objective: Vital signs in last 24 hours: Temp:  [98.2 F (36.8 C)-98.9 F (37.2 C)] 98.9 F (37.2 C) (06/16 0555) Pulse Rate:  [101-108] 101 (06/16 0555) Resp:  [17-18] 18 (06/16 0555) BP: (122-130)/(67-79) 127/79 mmHg (06/16 0555) SpO2:  [93 %-95 %] 95 % (06/16 0555) Weight:  [71.7 kg (158 lb 1.1 oz)] 71.7 kg (158 lb 1.1 oz) (06/16 0555)A  Intake/Output from previous day: 06/15 0701 - 06/16 0700 In: 480 [P.O.:480] Out: 1800 [Urine:1700;  Stool:100] Intake/Output this shift: Total I/O In: 240 [P.O.:240] Out: 250 [Urine:200; Stool:50]  Past Medical History  Diagnosis Date  . PONV (postoperative nausea and vomiting)   . Headache(784.0)     not since surgery for spinal leak  . Blood dyscrasia     thrombocytosis not on any meds  . Chronic back pain   . Anemia     History  . Essential thrombocytosis (HCC)     pt. denies this diagnosis  . Tachycardia     Physical Exam:  Lungs - Normal respiratory effort, chest expands symmetrically.  Abdomen - Soft, non-tender & non-distended.  Lab Results:  Recent Labs  03/12/16 0340 03/13/16 0409  WBC 20.9* 18.9*  HGB 7.3* 7.4*  HCT 23.1* 22.7*   BMET  Recent Labs  03/12/16 0340 03/13/16 0409  NA 126* 129*  K 3.9 3.6  CL 96* 96*  CO2 26 28  GLUCOSE 99 70  BUN 6 <5*  CREATININE 0.41* 0.36*  CALCIUM 7.5* 7.5*   No results for input(s): LABURIN in the last 72 hours. Results for orders placed or performed during the hospital encounter of 02/29/16  Surgical PCR screen     Status: None   Collection Time: 03/03/16  4:00 AM  Result Value Ref Range Status   MRSA, PCR NEGATIVE NEGATIVE Final   Staphylococcus aureus NEGATIVE NEGATIVE Final    Comment:        The Xpert SA Assay (FDA approved for NASAL specimens in patients over 29 years of age), is one component  of a comprehensive surveillance program.  Test performance has been validated by [facility] for patients greater than or equal to 36 year old. It is not intended to diagnose infection nor to guide or monitor treatment.   Urine culture     Status: None   Collection Time: 03/06/16  1:57 AM  Result Value Ref Range Status   Specimen Description URINE, CATHETERIZED  Final   Special Requests NONE  Final   Culture NO GROWTH  Final   Report Status 03/07/2016 FINAL  Final  Culture, blood (routine x 2)     Status: Abnormal   Collection Time: 03/10/16  1:45 PM  Result Value Ref Range Status   Specimen  Description BLOOD LEFT FOREARM  Final   Special Requests BOTTLES DRAWN AEROBIC AND ANAEROBIC 10CC  Final   Culture  Setup Time   Final    GRAM POSITIVE COCCI IN CLUSTERS IN BOTH AEROBIC AND ANAEROBIC BOTTLES CRITICAL RESULT CALLED TO, READ BACK BY AND VERIFIED WITH: C. STEWART, PHARM D AT 1434 ON [id number] BY Rhea Bleacher    Culture (A)  Final    STAPHYLOCOCCUS SPECIES (COAGULASE NEGATIVE) SUSCEPTIBILITIES PERFORMED ON PREVIOUS CULTURE WITHIN THE LAST 5 DAYS.    Report Status 03/13/2016 FINAL  Final  Culture, blood (routine x 2)     Status: Abnormal   Collection Time: 03/10/16  2:00 PM  Result Value Ref Range Status   Specimen Description BLOOD LEFT HAND  Final   Special Requests BOTTLES DRAWN AEROBIC AND ANAEROBIC 10CC  Final   Culture  Setup Time   Final    GRAM POSITIVE COCCI IN CLUSTERS IN BOTH AEROBIC AND ANAEROBIC BOTTLES CRITICAL RESULT CALLED TO, READ BACK BY AND VERIFIED WITH: J. Darleen Crocker D AT 1115 ON [id number] BY M. WILSON    Culture STAPHYLOCOCCUS SPECIES (COAGULASE NEGATIVE) (A)  Final   Report Status 03/13/2016 FINAL  Final   Organism ID, Bacteria STAPHYLOCOCCUS SPECIES (COAGULASE NEGATIVE)  Final      Susceptibility   Staphylococcus species (coagulase negative) - MIC*    CIPROFLOXACIN <=0.5 SENSITIVE Sensitive     ERYTHROMYCIN <=0.25 SENSITIVE Sensitive     GENTAMICIN <=0.5 SENSITIVE Sensitive     OXACILLIN >=4 RESISTANT Resistant     TETRACYCLINE <=1 SENSITIVE Sensitive     VANCOMYCIN 1 SENSITIVE Sensitive     TRIMETH/SULFA <=10 SENSITIVE Sensitive     CLINDAMYCIN <=0.25 SENSITIVE Sensitive     RIFAMPIN <=0.5 SENSITIVE Sensitive     Inducible Clindamycin NEGATIVE Sensitive     * STAPHYLOCOCCUS SPECIES (COAGULASE NEGATIVE)  Blood Culture ID Panel (Reflexed)     Status: Abnormal   Collection Time: 03/10/16  2:00 PM  Result Value Ref Range Status   Enterococcus species NOT DETECTED NOT DETECTED Final   Vancomycin resistance NOT DETECTED NOT DETECTED Final    Listeria monocytogenes NOT DETECTED NOT DETECTED Final   Staphylococcus species DETECTED (A) NOT DETECTED Final    Comment: CRITICAL RESULT CALLED TO, READ BACK BY AND VERIFIED WITH: J. Frens Pharm.D. 11:15 03/11/16 ([email or web address])    Staphylococcus aureus NOT DETECTED NOT DETECTED Final   Methicillin resistance DETECTED (A) NOT DETECTED Final    Comment: CRITICAL RESULT CALLED TO, READ BACK BY AND VERIFIED WITH: J. Frens Pharm.D. 11:15 03/11/16 ([email or web address])    Streptococcus species NOT DETECTED NOT DETECTED Final   Streptococcus agalactiae NOT DETECTED NOT DETECTED Final   Streptococcus pneumoniae NOT DETECTED NOT DETECTED Final   Streptococcus pyogenes NOT DETECTED NOT DETECTED Final  Acinetobacter baumannii NOT DETECTED NOT DETECTED Final   Enterobacteriaceae species NOT DETECTED NOT DETECTED Final   Enterobacter cloacae complex NOT DETECTED NOT DETECTED Final   Escherichia coli NOT DETECTED NOT DETECTED Final   Klebsiella oxytoca NOT DETECTED NOT DETECTED Final   Klebsiella pneumoniae NOT DETECTED NOT DETECTED Final   Proteus species NOT DETECTED NOT DETECTED Final   Serratia marcescens NOT DETECTED NOT DETECTED Final   Carbapenem resistance NOT DETECTED NOT DETECTED Final   Haemophilus influenzae NOT DETECTED NOT DETECTED Final   Neisseria meningitidis NOT DETECTED NOT DETECTED Final   Pseudomonas aeruginosa NOT DETECTED NOT DETECTED Final   Candida albicans NOT DETECTED NOT DETECTED Final   Candida glabrata NOT DETECTED NOT DETECTED Final   Candida krusei NOT DETECTED NOT DETECTED Final   Candida parapsilosis NOT DETECTED NOT DETECTED Final   Candida tropicalis NOT DETECTED NOT DETECTED Final    Studies/Results: No results found.    Carolyn Barton I Carolyn Barton 03/14/2016, 7:00 AM

## 2016-03-18 NOTE — Op Note (Signed)
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 Gaynelle Arabian, MD  First assistant:  None  Anesthesia:  Gen. LMA  Preparation:  After appropriate preanesthesia, the patient was brought the operative room, placed on the operating table in the dorsal supine position where general LMA anesthesia was introduced. She was replaced in the dorsal lithotomy position when he was was prepped with Betadine solution and draped in usual fashion. The left arm was previous seen Mark. CT scan was reviewed. The patient was noted to have an enlarged left kidney. She has an intrarenal pelvis on the left side. There is no left hydronephrosis. 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 The patient is now pre-chemotherapy, and is for left double-J stent prior to her chemotherapy.  Review history:  Carolyn Barton has presented today for surgery, with the diagnosis of METASTASTIC COLON CANCER;LEFT URETERAL OBSTRUCTION The various methods of treatment have been discussed with the patient and family. After consideration of risks, benefits and other options for treatment, the patient has consented to Procedure(s): CYSTOSCOPY WITH LEFT RETROGRADE PYELOGRAM, Leonard (Left) as a surgical intervention . The patient's history has been reviewed, patient examined, no change in status, stable for surgery. I have reviewed the patient's chart and labs. Questions were answered to the patient's satisfaction  Statement of  Likelihood of Success: Excellent. TIME-OUT observed.:  Procedure:  Cystourethroscopy was accomplished, showing  blood cup in the bladder, which is irrigated free from the bladder, as thought secondary to Foley catheterization of the last 2 weeks. Edema is noted within the bladder but no cancer is identified within the bladder. Left retrograde pyelogram was performed, which shows some dilation of the lower ureter, but no definite hydronephrosis is identified. The renal pelvis is noted to be intrarenal, and the calyces appear to be within normal limits. There is no evidence of bladder tumor or stone. There is no evidence of renal calculi or tumor. A 0.038 guidewire is passed easily into the upper pole of the renal pelvis, and coil. Over this, an 8.0 [place] Polaris stent is passed, and settles in the middle of the intrarenal pelvis, and is passed into the ureteral orifice on the left side. The hairs of the distal portion of the stent then protruded into the bladder. Evaluation of the CT scan show that this is correct position of the stent, into the midportion of the kidney. The patient is given IV Toradol, awakened, and taken to recovery room in good condition.

## 2016-03-18 NOTE — Anesthesia Preprocedure Evaluation (Addendum)
Anesthesia Evaluation  Patient identified by MRN, date of birth, ID band Patient awake    Reviewed: Allergy & Precautions, NPO status , Patient's Chart, lab work & pertinent test results  History of Anesthesia Complications (+) PONV and history of anesthetic complications  Airway Mallampati: I  TM Distance: >3 FB Neck ROM: Full    Dental  (+) Dental Advisory Given   Pulmonary former smoker,    breath sounds clear to auscultation       Cardiovascular (-) hypertension Rhythm:Regular Rate:Normal  '16 ECHO: EF 65-70%, valves OK   Neuro/Psych Chronic back pain    GI/Hepatic Liver mets Widely metastatic colon cancer   Endo/Other    Renal/GU negative Renal ROS     Musculoskeletal   Abdominal   Peds  Hematology  (+) Blood dyscrasia (Hb 9.3, plt 955), ,   Anesthesia Other Findings   Reproductive/Obstetrics                            Anesthesia Physical Anesthesia Plan  ASA: III  Anesthesia Plan: General   Post-op Pain Management:    Induction: Intravenous  Airway Management Planned: Oral ETT  Additional Equipment:   Intra-op Plan:   Post-operative Plan: Extubation in OR  Informed Consent: I have reviewed the patients History and Physical, chart, labs and discussed the procedure including the risks, benefits and alternatives for the proposed anesthesia with the patient or authorized representative who has indicated his/her understanding and acceptance.   Dental advisory given  Plan Discussed with: CRNA and Surgeon  Anesthesia Plan Comments: (Plan routine monitors, GETA)        Anesthesia Quick Evaluation

## 2016-03-18 NOTE — Interval H&P Note (Signed)
History and Physical Interval Note:  03/18/2016 7:01 AM  Carolyn Barton  has presented today for surgery, with the diagnosis of METASTASTIC COLON CANCER  The various methods of treatment have been discussed with the patient and family. After consideration of risks, benefits and other options for treatment, the patient has consented to  Procedure(s): INSERTION PORT-A-CATH WITH ULTRA SOUND (N/A) CYSTOSCOPY WITH LEFT  RETROGRADE PYELOGRAM,  AND  POLARIS STENT PLACEMENT (Left) as a surgical intervention .  The patient's history has been reviewed, patient examined, no change in status, stable for surgery.  I have reviewed the patient's chart and labs.  Questions were answered to the patient's satisfaction.     Leyah Bocchino I Sophonie Goforth

## 2016-03-18 NOTE — Transfer of Care (Signed)
Immediate Anesthesia Transfer of Care Note  Patient: Carolyn Barton  Procedure(s) Performed: Procedure(s): CYSTOSCOPY WITH LEFT  RETROGRADE PYELOGRAM,  AND  POLARIS STENT PLACEMENT (Left)  Patient Location: PACU  Anesthesia Type:General  Level of Consciousness:  sedated, patient cooperative and responds to stimulation  Airway & Oxygen Therapy:Patient Spontanous Breathing and Patient connected to face mask oxgen  Post-op Assessment:  Report given to PACU RN and Post -op Vital signs reviewed and stable  Post vital signs:  Reviewed and stable  Last Vitals:  Filed Vitals:   03/18/16 0535  BP: 133/83  Pulse: 98  Temp: 36.7 C  Resp: 18    Complications: No apparent anesthesia complications

## 2016-03-19 ENCOUNTER — Telehealth: Payer: Self-pay | Admitting: *Deleted

## 2016-03-19 ENCOUNTER — Encounter (HOSPITAL_COMMUNITY): Payer: Self-pay | Admitting: Urology

## 2016-03-19 LAB — CULTURE, BLOOD (ROUTINE X 2)
Culture: NO GROWTH
Culture: NO GROWTH

## 2016-03-19 NOTE — Telephone Encounter (Signed)
  Oncology Nurse Navigator Documentation  Navigator Location: [facility] (03/19/16 0957) Navigator Encounter Type: Telephone (03/19/16 0957) Telephone: Incoming Call;Outgoing Call;Appt Confirmation/Clarification (03/19/16 0957)   Call from patient to inquire if Dr. Benay Spice still wants to see her on 6/22 at 4 pm since her Port was not able to be placed. Called back and left VM to keep her appointment with Dr. Benay Spice on 03/20/16. We will assist in getting the port set up after she is seen.

## 2016-03-20 ENCOUNTER — Other Ambulatory Visit: Payer: Self-pay | Admitting: *Deleted

## 2016-03-20 ENCOUNTER — Encounter: Payer: Self-pay | Admitting: *Deleted

## 2016-03-20 ENCOUNTER — Ambulatory Visit (HOSPITAL_BASED_OUTPATIENT_CLINIC_OR_DEPARTMENT_OTHER): Payer: BLUE CROSS/BLUE SHIELD | Admitting: Oncology

## 2016-03-20 ENCOUNTER — Encounter (HOSPITAL_COMMUNITY): Payer: Self-pay

## 2016-03-20 ENCOUNTER — Telehealth: Payer: Self-pay | Admitting: Oncology

## 2016-03-20 VITALS — BP 125/82 | HR 122 | Temp 98.2°F | Resp 18 | Ht 65.0 in | Wt 125.3 lb

## 2016-03-20 DIAGNOSIS — D473 Essential (hemorrhagic) thrombocythemia: Secondary | ICD-10-CM

## 2016-03-20 DIAGNOSIS — L03311 Cellulitis of abdominal wall: Secondary | ICD-10-CM | POA: Diagnosis not present

## 2016-03-20 DIAGNOSIS — C786 Secondary malignant neoplasm of retroperitoneum and peritoneum: Secondary | ICD-10-CM | POA: Diagnosis not present

## 2016-03-20 DIAGNOSIS — B958 Unspecified staphylococcus as the cause of diseases classified elsewhere: Secondary | ICD-10-CM

## 2016-03-20 DIAGNOSIS — K5660 Unspecified intestinal obstruction: Secondary | ICD-10-CM

## 2016-03-20 DIAGNOSIS — C787 Secondary malignant neoplasm of liver and intrahepatic bile duct: Secondary | ICD-10-CM | POA: Diagnosis not present

## 2016-03-20 DIAGNOSIS — M549 Dorsalgia, unspecified: Secondary | ICD-10-CM

## 2016-03-20 DIAGNOSIS — C189 Malignant neoplasm of colon, unspecified: Secondary | ICD-10-CM

## 2016-03-20 DIAGNOSIS — R000 Tachycardia, unspecified: Secondary | ICD-10-CM

## 2016-03-20 DIAGNOSIS — C20 Malignant neoplasm of rectum: Secondary | ICD-10-CM | POA: Diagnosis not present

## 2016-03-20 NOTE — Telephone Encounter (Signed)
per pof to sch pt appt-sent override to MW-per [name] to print avs pt aware of appt

## 2016-03-20 NOTE — Patient Instructions (Signed)
  Care Plan Summary- 03/20/2016 Name:  Carolyn Barton     DOB: 1955-10-29 Your Medical Team:  Medical Oncologist:  Dr. Ma Rings Radiation Oncologist:    Surgeon:   Dr. Erroll Luna Type of Cancer: Adenocarcinoma of Colon  Stage/Grade: Stage IV *Exact staging of your cancer is based on size of the tumor, depth of invasion, involvement of lymph nodes or not, and whether or not the cancer has spread beyond the primary site.   Recommendations: Based on information available as of today's consult. Recommendations may change depending on the results of further tests or exams. 1) Chemotherapy with FOLFIRI (5-Fluorouracil, Leucovorin, Irinotecan) and Panitumumab every 2 weeks -will reassess after 4-5 cycles with CT scan 2) Will have port placed by Dr. Brantley Stage in about 2 weeks 3) May obtain a repeat CT abdomen prior to starting chemotherapy for a baseline ______________________________________________________________________________ Next Steps: 1) Work on your protein intake and getting stronger 2) Return on 04/04/16 to see Dr. Benay Spice with chemo class 3)   Questions? [name], RN, BSN at [phone]. [name] is your Oncology Nurse Navigator and is available to assist you while you're receiving your medical care at [facility].

## 2016-03-20 NOTE — Progress Notes (Signed)
Oncology Nurse Navigator Documentation  Oncology Nurse Navigator Flowsheets 03/20/2016  Navigator Location [facility]  Navigator Encounter Type Initial MedOnc  Telephone -  Abnormal Finding Date -  Confirmed Diagnosis Date -  Surgery Date -  Patient Visit Type MedOnc;Initial  Treatment Phase Pre-Tx/Tx Discussion  Barriers/Navigation Needs Education  Education Understanding Cancer/ Treatment Options;Coping with Diagnosis/ Prognosis;Newly Diagnosed Cancer Education;Preparing for Upcoming Surgery/ Treatment  Interventions Referrals;Education Method  Referrals Nutrition/dietician  Education Method Verbal;Written;Teach-back  Support Groups/Services GI Support Group;[employer] for Set designer;Other--[facility] support services  Acuity Level 2  Time Spent with Patient 24  Met with patient and husband, Purcell Nails during new patient visit. Explained the role of the GI Nurse Navigator and provided New Patient Packet with information on: 1. Colon cancer--PAC info, CEA test 2. Support groups 3. Advanced Directives 4. Fall Safety Plan Answered questions, reviewed current treatment plan using TEACH back and provided emotional support. Provided copy of current treatment plan. Currently has physical therapy in home twice weekly via [place] as well as RN visit weekly. Husband is independent in pouch change and emptying. He is also doing daily wet to dry dressing packing. She is motivated to start treatment and do whatever is necessary to get well. Escorted her to front entrance via w/c due to ambulation causes extreme fatigue. She will return on 04/04/16. Dr. Benay Spice will call Dr. Brantley Stage to discuss when to place port.  [name], RN, BSN GI Oncology [place]

## 2016-03-20 NOTE — Progress Notes (Signed)
[place] OFFICE PROGRESS NOTE   Diagnosis: Colon cancer  INTERVAL HISTORY:   I saw Carolyn Barton in the hospital after she presented with an obstructing distal colorectal cancer. She underwent an exploratory laparotomy on 03/03/2016. She was confirmed to have carcinomatosis with an obstructing mass in the proximal rectum. Liver metastases were noted. The mass was adherent to the left pelvic sidewall with evidence of early left hydronephrosis. The tumor was unresectable. She underwent creation of an end colostomy and mucous fistula.  The postoperative course was complicated by a left abdominal wall cellulitis and staph bacteremia. She was treated with vancomycin beginning 03/11/2016 and is completing an outpatient course of Bactrim. She reports improvement in the left wall erythema. The colostomy is functioning. The midline abdominal wound is being packed. She continues to have tachycardia. She is beginning a home physical therapy program. Ms. Vanhorne is eating a regular diet, but her oral intake is limited.  Dr. Gaynelle Arabian placed a left ureter stent on 03/18/2016.  Objective:  Vital signs in last 24 hours:  Blood pressure 125/82, pulse 122, temperature 98.2 F (36.8 C), temperature source Oral, resp. rate 18, height '5\' 5"'$  (1.651 m), weight 125 lb 4.8 oz (56.836 kg), SpO2 100 %.   Resp: Decreased breath sounds the posterior basis, no respiratory distress Cardio: Regular rate and rhythm, tachycardia GI: Midline wound is open with a packing in place, left lower quadrant colostomy with semi-formed stool Vascular: No leg edema  Skin: Fading erythema/induration at the left mid to lateral abdominal wall    Lab Results:  Lab Results  Component Value Date   WBC 16.7* 03/18/2016   HGB 9.3* 03/18/2016   HCT 28.9* 03/18/2016   MCV 65.8* 03/18/2016   PLT 955* 03/18/2016   NEUTROABS 3.2 08/10/2015      Imaging:  Dg Chest 2 View  03/18/2016  CLINICAL DATA:  Preoperative  radiograph. EXAM: CHEST  2 VIEW COMPARISON:  03/06/2016 FINDINGS: Cardiomediastinal silhouette is normal. Mediastinal contours appear intact. There is no evidence of pneumothorax. There are bilateral pleural effusions, greater on the left. Mild increased interstitial markings are consistent with pulmonary vascular congestion. Osseous structures are without acute abnormality. Soft tissues are grossly normal. IMPRESSION: Persistent bilateral pleural effusions, larger on the left. Mild pulmonary vascular congestion. Electronically Signed   By: Fidela Salisbury M.D.   On: 03/18/2016 06:55   Dg Abd 1 View  03/18/2016  CLINICAL DATA:  Left hydronephrosis.  Stent placement. EXAM: ABDOMEN - 1 VIEW; DG C-ARM 1-60 MIN-NO REPORT COMPARISON:  CT 03/10/2016 FINDINGS: Single intraoperative spot image demonstrates mild fullness of the left renal collecting system. The proximal aspect of a left ureteral stent is in place within the proximal left ureter. The locking pigtail loop is partially formed. IMPRESSION: Left ureteral stent tip in the proximal left ureter. Electronically Signed   By: Rolm Baptise M.D.   On: 03/18/2016 08:33   Dg C-arm 1-60 Min-no Report  03/18/2016  CLINICAL DATA: SURGERY C-ARM 1-60 MINUTES Fluoroscopy was utilized by the requesting physician.  No radiographic interpretation.    Medications: I have reviewed the patient's current medications.  Assessment/Plan: 1. Metastatic colorectal cancer-status post an exploratory laparotomy 03/03/2016 revealing a proximal rectal mass, peritoneal carcinomatosis, and liver metastases  Biopsy of peritoneal nodules 03/03/2016 confirmed metastatic adenocarcinoma consistent with a colon primary  Foundation 1 testing-MSI-stable, tumor mutation burden-low, no BRAF NRAS or KRAS mutation  2. Bowel obstruction secondary to #1  3. Chronic back pain  4. Essential thrombocytosis  5. Early obstruction of the left ureter noted at the time of surgery  03/03/2016-Dr. Tannenbaum placed a left double-J stent 03/18/2016  6.  Abdominal wall cellulitis 03/10/2016, blood cultures positive for coagulase negative staphylococcus-methicillin-resistant, status post treatment with vancomycin and Bactrim  7.  Tachycardia-persistent following discharge from the hospital, likely related to pleural effusions, anemia, and infection.   Disposition:  Carolyn Barton is recovering from surgery and staph cellulitis/bacteremia. She is completing a course of Bactrim.  She has been diagnosed with metastatic colorectal cancer. We discussed treatment options. We discussed FOLFOX and FOLFIRI chemotherapy. We also discussed biologic therapy with Avastin or panitumumab.  She has a significant metastatic tumor burden with a left sided K-ras wild-type tumor. I recommend initial treatment with FOLFIRI/panitumumab.  We reviewed the potential toxicities associated with this regimen including the chance for nausea/vomiting, mucositis, diarrhea and alopecia, and hematologic toxicity. We discussed the acute/delayed diarrhea associated with irinotecan. We discussed the rash, sun sensitivity, hyperpigmentation, and hand/foot syndrome seen with 5 fluorouracil. We discussed the allergic reaction, rash, and diarrhea associated with panitumumab. She agrees to proceed.  Ms. Smestad will attend a chemotherapy teaching class. She will return for an office visit and further discussion on 04/04/2016. Her performance status is not adequate to beginning chemotherapy at present. She will continue follow-up with Dr. Brantley Stage for wound management and treatment of the cellulitis.  I will discuss timing of the Port-A-Cath placement with Dr. Brantley Stage.  [name] Coder, MD  03/20/2016  4:40 PM

## 2016-03-31 ENCOUNTER — Other Ambulatory Visit: Payer: Self-pay | Admitting: Surgery

## 2016-03-31 DIAGNOSIS — C787 Secondary malignant neoplasm of liver and intrahepatic bile duct: Principal | ICD-10-CM

## 2016-03-31 DIAGNOSIS — C189 Malignant neoplasm of colon, unspecified: Secondary | ICD-10-CM

## 2016-04-02 ENCOUNTER — Telehealth (HOSPITAL_COMMUNITY): Payer: Self-pay | Admitting: Surgery

## 2016-04-02 NOTE — Telephone Encounter (Signed)
Called pt, left VM for her to call to schedule her PAC placement JM

## 2016-04-03 ENCOUNTER — Telehealth: Payer: Self-pay | Admitting: Oncology

## 2016-04-03 NOTE — Telephone Encounter (Signed)
per Kenney Houseman to CX lab-2seperate pof put in on 6/22-appt CX

## 2016-04-04 ENCOUNTER — Other Ambulatory Visit: Payer: BLUE CROSS/BLUE SHIELD

## 2016-04-04 ENCOUNTER — Encounter (HOSPITAL_COMMUNITY): Payer: Self-pay

## 2016-04-04 ENCOUNTER — Ambulatory Visit (HOSPITAL_BASED_OUTPATIENT_CLINIC_OR_DEPARTMENT_OTHER): Payer: BLUE CROSS/BLUE SHIELD | Admitting: Oncology

## 2016-04-04 ENCOUNTER — Telehealth: Payer: Self-pay | Admitting: *Deleted

## 2016-04-04 ENCOUNTER — Ambulatory Visit (HOSPITAL_COMMUNITY)
Admission: RE | Admit: 2016-04-04 | Discharge: 2016-04-04 | Disposition: A | Discharge status: Home / Self Care | Payer: BLUE CROSS/BLUE SHIELD | Source: Ambulatory Visit | Attending: Oncology | Admitting: Oncology

## 2016-04-04 VITALS — BP 131/86 | HR 117 | Temp 98.3°F | Resp 17 | Ht 65.0 in | Wt 121.8 lb

## 2016-04-04 DIAGNOSIS — R000 Tachycardia, unspecified: Secondary | ICD-10-CM | POA: Diagnosis present

## 2016-04-04 DIAGNOSIS — K769 Liver disease, unspecified: Secondary | ICD-10-CM | POA: Diagnosis not present

## 2016-04-04 DIAGNOSIS — K449 Diaphragmatic hernia without obstruction or gangrene: Secondary | ICD-10-CM | POA: Diagnosis not present

## 2016-04-04 DIAGNOSIS — C20 Malignant neoplasm of rectum: Secondary | ICD-10-CM | POA: Diagnosis not present

## 2016-04-04 DIAGNOSIS — K5660 Unspecified intestinal obstruction: Secondary | ICD-10-CM | POA: Diagnosis not present

## 2016-04-04 DIAGNOSIS — J9811 Atelectasis: Secondary | ICD-10-CM | POA: Insufficient documentation

## 2016-04-04 DIAGNOSIS — C786 Secondary malignant neoplasm of retroperitoneum and peritoneum: Secondary | ICD-10-CM | POA: Diagnosis not present

## 2016-04-04 DIAGNOSIS — C787 Secondary malignant neoplasm of liver and intrahepatic bile duct: Secondary | ICD-10-CM | POA: Diagnosis not present

## 2016-04-04 DIAGNOSIS — J90 Pleural effusion, not elsewhere classified: Secondary | ICD-10-CM | POA: Diagnosis not present

## 2016-04-04 DIAGNOSIS — C189 Malignant neoplasm of colon, unspecified: Secondary | ICD-10-CM | POA: Insufficient documentation

## 2016-04-04 DIAGNOSIS — N135 Crossing vessel and stricture of ureter without hydronephrosis: Secondary | ICD-10-CM

## 2016-04-04 DIAGNOSIS — D473 Essential (hemorrhagic) thrombocythemia: Secondary | ICD-10-CM

## 2016-04-04 MED ORDER — LIDOCAINE-PRILOCAINE 2.5-2.5 % EX CREA: TOPICAL_CREAM | CUTANEOUS | Status: DC

## 2016-04-04 MED ORDER — PROCHLORPERAZINE MALEATE 10 MG PO TABS: 10.0000 mg | ORAL_TABLET | Freq: Four times a day (QID) | ORAL | Status: DC | PRN

## 2016-04-04 MED ORDER — IOPAMIDOL (ISOVUE-370) INJECTION 76%
100.0000 mL | Freq: Once | INTRAVENOUS | Status: AC | PRN
  Administered 2016-04-04: 80 mL via INTRAVENOUS

## 2016-04-04 NOTE — Telephone Encounter (Signed)
Per staff message and POF I have scheduled appts. Advised scheduler of appts. JMW  

## 2016-04-04 NOTE — Progress Notes (Signed)
Pt reports she received a message to call and schedule port placement. She has been unable to reach schedulers, requested assistance getting scheduled. Left message with Shirlean Mylar in triage with Dr. Brantley Stage to clarify whether IR or surgeon is placing port. Per Dr. Josetta Huddle office Shirlean Mylar): port will be placed by IR at [facility].  Left message on voicemail requesting Anderson Malta contact pt to schedule placement.

## 2016-04-04 NOTE — Progress Notes (Signed)
[facility] OFFICE PROGRESS NOTE   Diagnosis: Colon cancer  INTERVAL HISTORY:   Carolyn Barton returns as scheduled. She reports the abdominal wound is healing. Her appetite and energy level have improved. She is being scheduled for placement of a Port-A-Cath in interventional radiology. Carolyn Barton continues to have tachycardia. She denies dyspnea.  Objective:  Vital signs in last 24 hours:  Blood pressure 131/86, pulse 117, temperature 98.3 F (36.8 C), temperature source Oral, resp. rate 17, height 5\' 5"  (1.651 m), weight 121 lb 12.8 oz (55.248 kg), SpO2 98 %.   Resp: Inspiratory rhonchi and decreased breath sounds at the left posterior chest, no respiratory distress Cardio: Regular rate and rhythm, tachycardia GI: No hepatomegaly, left lower quadrant colostomy, open midline wound with a packing in place. Vascular: No leg edema  Skin: Mild remaining induration at the left abdominal wall, no erythema   Portacath/PICC-without erythema  Lab Results:  Lab Results  Component Value Date   WBC 16.7* 03/18/2016   HGB 9.3* 03/18/2016   HCT 28.9* 03/18/2016   MCV 65.8* 03/18/2016   PLT 955* 03/18/2016   NEUTROABS 3.2 08/10/2015      Imaging:  Ct Angio Chest Pe W Or Wo Contrast  04/04/2016  CLINICAL DATA:  Possible pulmonary embolus, tachycardia, history of colon cancer EXAM: CT ANGIOGRAPHY CHEST WITH CONTRAST TECHNIQUE: Multidetector CT imaging of the chest was performed using the standard protocol during bolus administration of intravenous contrast. Multiplanar CT image reconstructions and MIPs were obtained to evaluate the vascular anatomy. CONTRAST:  80 cc Isovue COMPARISON:  CT abdomen and pelvis 6/ 12/17 FINDINGS: Cardiovascular: No aortic aneurysm or dissection. The study is of excellent technical quality. No pulmonary embolus is noted. There is no mediastinal hematoma or adenopathy. Mediastinum/Nodes: No hilar adenopathy is noted. There is moderate size hiatal  hernia. Fluid and some debris is noted in mid esophagus suspicious for gastroesophageal reflux. Lungs/Pleura: Images of the lung parenchyma shows no infiltrate or pulmonary edema. Axial image 158 there is stable 6 mm nodule in right middle lobe. No other pulmonary nodules are noted. Trace atelectasis noted in left lower lobe posteriorly. Tiny calcified granuloma in right middle lobe laterally. Trace right posterior pleural effusion. Upper Abdomen: The visualized upper abdomen shows stable low-density lesion in right hepatic lobe anteriorly measures 2.2 cm. Tiny low-density lesion in right hepatic lobe posteriorly stable from prior exam. Small loculated ascites is noted anterior to splenic capsule. Musculoskeletal: No destructive bony lesions are noted. Sagittal images of the spine shows mild degenerative changes thoracic spine. Sagittal view of the sternum is unremarkable. Review of the MIP images confirms the above findings. IMPRESSION: 1. No evidence of pulmonary embolus. 2. No mediastinal hematoma or adenopathy. 3. No aortic aneurysm. 4. There is moderate size hiatal hernia. Some fluid noted within mid esophagus suspicious for gastroesophageal reflux. 5. Stable 6 mm nodule in right middle lobe. Non-contrast chest CT at 6-12 months is recommended. If the nodule is stable at time of repeat CT, then future CT at 18-24 months (from today's scan) is considered optional for low-risk patients, but is recommended for high-risk patients. This recommendation follows the consensus statement: Guidelines for Management of Incidental Pulmonary Nodules Detected on CT Images:From the Fleischner Society 2017; published online before print (10.1148/radiol.2018). 6. Trace left pleural effusion with left posterior basilar atelectasis. 7. Stable low-density lesions within liver. Electronically Signed   By: [id number] M.D.   On: 04/04/2016 15:29    Medications: I have reviewed the patient's  current  medications.  Assessment/Plan: 1. Metastatic colorectal cancer-status post an exploratory laparotomy 03/03/2016 revealing a proximal rectal mass, peritoneal carcinomatosis, and liver metastases  Biopsy of peritoneal nodules 03/03/2016 confirmed metastatic adenocarcinoma consistent with a colon primary  Foundation 1 testing-MSI-stable, tumor mutation burden-low, no BRAF NRAS or KRAS mutation  2. Bowel obstruction secondary to #1  3. Chronic back pain  4. Essential thrombocytosis  5. Early obstruction of the left ureter noted at the time of surgery 03/03/2016-Dr. Tannenbaum placed a left double-J stent 03/18/2016  6. Abdominal wall cellulitis 03/10/2016, blood cultures positive for coagulase negative staphylococcus-methicillin-resistant, status post treatment with vancomycin and Bactrim  7. Tachycardia-persistent following discharge from the hospital, likely related to anemia and deconditioning  CT chest 04/04/2016-negative for pulmonary embolism.    Disposition:  Carolyn Barton is recovering from the exploratory laparotomy. Her performance status is improving. The abdominal wound is healing. She will be scheduled for placement of a Port-A-Cath. The plan is to begin FOLFIRI/panitumumab on 04/14/2016.  She attended a chemotherapy teaching class. We reviewed the toxicities associated with the FOLFIRI/panitumumab regimen. She agrees to proceed.  The sacral be scheduled for an office visit and chemotherapy on 04/14/2016.  [name] Coder, MD  04/04/2016  3:41 PM

## 2016-04-07 ENCOUNTER — Telehealth (HOSPITAL_COMMUNITY): Payer: Self-pay | Admitting: Surgery

## 2016-04-07 NOTE — Telephone Encounter (Signed)
Called pt, left VM for her to call to schedule PAC placement JM

## 2016-04-08 ENCOUNTER — Other Ambulatory Visit: Payer: Self-pay | Admitting: General Surgery

## 2016-04-10 ENCOUNTER — Encounter (HOSPITAL_COMMUNITY): Payer: Self-pay

## 2016-04-10 ENCOUNTER — Ambulatory Visit (HOSPITAL_COMMUNITY)
Admission: RE | Admit: 2016-04-10 | Discharge: 2016-04-10 | Disposition: A | Discharge status: Home / Self Care | Payer: BLUE CROSS/BLUE SHIELD | Source: Ambulatory Visit | Attending: Surgery | Admitting: Surgery

## 2016-04-10 ENCOUNTER — Other Ambulatory Visit: Payer: Self-pay | Admitting: Surgery

## 2016-04-10 DIAGNOSIS — C189 Malignant neoplasm of colon, unspecified: Secondary | ICD-10-CM

## 2016-04-10 DIAGNOSIS — C787 Secondary malignant neoplasm of liver and intrahepatic bile duct: Secondary | ICD-10-CM

## 2016-04-10 HISTORY — PX: PORTACATH PLACEMENT: SHX2246

## 2016-04-10 LAB — BASIC METABOLIC PANEL
Anion gap: 8 (ref 5–15)
CALCIUM: 9 mg/dL (ref 8.9–10.3)
CO2: 33 mmol/L — ABNORMAL HIGH (ref 22–32)
Chloride: 99 mmol/L — ABNORMAL LOW (ref 101–111)
Creatinine, Ser: 0.47 mg/dL (ref 0.44–1.00)
GFR calc Af Amer: >60 mL/min (ref >60)
GLUCOSE: 85 mg/dL (ref 65–99)
POTASSIUM: 3.7 mmol/L (ref 3.5–5.1)
SODIUM: 140 mmol/L (ref 135–145)

## 2016-04-10 LAB — CBC
HEMATOCRIT: 31.4 % — AB (ref 36.0–46.0)
Hemoglobin: 8.8 g/dL — ABNORMAL LOW (ref 12.0–15.0)
MCH: 20 pg — ABNORMAL LOW (ref 26.0–34.0)
MCHC: 28 g/dL — AB (ref 30.0–36.0)
MCV: 71.2 fL — ABNORMAL LOW (ref 78.0–100.0)
Platelets: 1033 10*3/uL (ref 150–400)
RBC: 4.41 MIL/uL (ref 3.87–5.11)
RDW: 18.2 % — ABNORMAL HIGH (ref 11.5–15.5)
WBC: 12.5 10*3/uL — AB (ref 4.0–10.5)

## 2016-04-10 LAB — PROTIME-INR
INR: 1.1 (ref 0.00–1.49)
Prothrombin Time: 14.4 seconds (ref 11.6–15.2)

## 2016-04-10 LAB — APTT: APTT: 33 s (ref 24–37)

## 2016-04-10 MED ORDER — HEPARIN SOD (PORK) LOCK FLUSH 100 UNIT/ML IV SOLN
INTRAVENOUS | Status: AC
  Filled 2016-04-10: qty 5

## 2016-04-10 MED ORDER — FENTANYL CITRATE (PF) 100 MCG/2ML IJ SOLN
INTRAMUSCULAR | Status: AC
  Filled 2016-04-10: qty 4

## 2016-04-10 MED ORDER — CEFAZOLIN SODIUM-DEXTROSE 2-4 GM/100ML-% IV SOLN
2.0000 g | INTRAVENOUS | Status: AC
  Administered 2016-04-10: 2 g via INTRAVENOUS

## 2016-04-10 MED ORDER — LIDOCAINE HCL 1 % IJ SOLN
INTRAMUSCULAR | Status: AC
  Filled 2016-04-10: qty 20

## 2016-04-10 MED ORDER — MIDAZOLAM HCL 2 MG/2ML IJ SOLN
INTRAMUSCULAR | Status: AC | PRN
  Administered 2016-04-10: 0.5 mg via INTRAVENOUS
  Administered 2016-04-10: 1 mg via INTRAVENOUS
  Administered 2016-04-10 (×2): 0.5 mg via INTRAVENOUS

## 2016-04-10 MED ORDER — LIDOCAINE HCL 1 % IJ SOLN
INTRAMUSCULAR | Status: AC | PRN
  Administered 2016-04-10: 10 mL

## 2016-04-10 MED ORDER — CEFAZOLIN SODIUM-DEXTROSE 2-4 GM/100ML-% IV SOLN
INTRAVENOUS | Status: AC
  Filled 2016-04-10: qty 100

## 2016-04-10 MED ORDER — FENTANYL CITRATE (PF) 100 MCG/2ML IJ SOLN
INTRAMUSCULAR | Status: AC | PRN
  Administered 2016-04-10: 25 ug via INTRAVENOUS
  Administered 2016-04-10: 50 ug via INTRAVENOUS
  Administered 2016-04-10: 25 ug via INTRAVENOUS

## 2016-04-10 MED ORDER — MIDAZOLAM HCL 2 MG/2ML IJ SOLN
INTRAMUSCULAR | Status: AC
  Filled 2016-04-10: qty 4

## 2016-04-10 MED ORDER — SODIUM CHLORIDE 0.9 % IV SOLN
INTRAVENOUS | Status: DC
  Administered 2016-04-10: 12:00:00 via INTRAVENOUS

## 2016-04-10 NOTE — H&P (Signed)
Chief Complaint: colon cancer, needs chemotherapy  Referring Physician:Dr. Erroll Luna  Supervising Physician: Corrie Mckusick  Patient Status: Out-pt  HPI: Carolyn Barton is an 60 y.o. female who underwent a colectomy and colostomy for colon cancer on 03-03-16.  She states she is doing quite well.  She is eating well and having no abdominal pain.  Her colostomy is working well.  She is followed by Dr. [name] for oncology.  She is supposed to start chemotherapy on Monday.  She presents today for her PAC to be placed.  Past Medical History:  Past Medical History  Diagnosis Date  . PONV (postoperative nausea and vomiting)   . Headache(784.0)     not since surgery for spinal leak  . Blood dyscrasia     thrombocytosis not on any meds  . Chronic back pain   . Anemia     History  . Tachycardia   . Essential thrombocytosis (HCC)     pt. denies this diagnosis  . Colon cancer [facility])     Past Surgical History:  Past Surgical History  Procedure Laterality Date  . Cyst excision  11/11/2012    lumbar cyst remove  . Maximum access (mas)posterior lumbar interbody fusion (plif) 1 level N/A 01/11/2014    Procedure: FOR MAXIMUM ACCESS (MAS) POSTERIOR LUMBAR INTERBODY FUSION Lumbar Five Sacral One;  Surgeon: [name], MD;  Location: [place] [facility];  Service: Neurosurgery;  Laterality: N/A;  FOR MAXIMUM ACCESS (MAS) POSTERIOR LUMBAR INTERBODY FUSION Lumbar Five Sacral One  . Back surgery      x 4 lower back  . Wisdom tooth extraction    . Perm. dental implant      lower back right  . Laparotomy N/A 03/03/2016    Procedure: EXPLORATORY LAPAROTOMY;  Surgeon: Erroll Luna, MD;  Location: [place];  Service: General;  Laterality: N/A;  . Biopsy N/A 03/03/2016    Procedure: BIOPSY OF PERITONEAL NODULE;  Surgeon: Erroll Luna, MD;  Location: [place];  Service: General;  Laterality: N/A;  . Colostomy N/A 03/03/2016    Procedure: DIVERTING SIGMOID COLOSTOMY;  Surgeon: Erroll Luna, MD;   Location: [place];  Service: General;  Laterality: N/A;  . Cystoscopy with retrograde pyelogram, ureteroscopy and stent placement Left 03/18/2016    Procedure: CYSTOSCOPY WITH LEFT  RETROGRADE PYELOGRAM,  [place];  Surgeon: Carolan Clines, MD;  Location: [facility];  Service: Urology;  Laterality: Left;    Family History:  Family History  Problem Relation Age of Onset  . Colon cancer Neg Hx   . Colon polyps Neg Hx   . Rectal cancer Neg Hx   . Stomach cancer Neg Hx   . Esophageal cancer Neg Hx   . Heart disease Mother   . Melanoma Mother     Social History:  reports that she has quit smoking. Her smoking use included Cigarettes. She has a .25 pack-year smoking history. She has never used smokeless tobacco. She reports that she does not drink alcohol or use illicit drugs.  Allergies: No Known Allergies  Medications:   Medication List    ASK your doctor about these medications        DULoxetine 60 MG capsule  Commonly known as:  CYMBALTA  Take 60 mg by mouth daily.     furosemide 20 MG tablet  Commonly known as:  LASIX  Take 20 mg by mouth daily.     HYDROmorphone 4 MG tablet  Commonly known as:  DILAUDID  Take 4 mg by mouth every 4 (four) hours as needed for severe pain.     lidocaine-prilocaine cream  Commonly known as:  EMLA  Apply to port site one hour prior to use. Do not rub in. Cover with plastic.     methadone 10 MG tablet  Commonly known as:  DOLOPHINE  Take 10 mg by mouth 3 (three) times daily.     prochlorperazine 10 MG tablet  Commonly known as:  COMPAZINE  Take 1 tablet (10 mg total) by mouth every 6 (six) hours as needed for nausea or vomiting.     URIBEL 118 MG Caps  Take 1 capsule (118 mg total) by mouth 3 (three) times daily between meals as needed.        Please HPI for pertinent positives, otherwise complete 10 system ROS negative.  Mallampati Score: MD Evaluation Airway: WNL Heart: WNL Abdomen: WNL Chest/ Lungs:  WNL ASA  Classification: 3 Mallampati/Airway Score: Two  Physical Exam: BP 123/80 mmHg  Pulse 105  Temp(Src) 98.5 F (36.9 C)  Resp 18  Ht 5\' 5"  (1.651 m)  Wt 121 lb (54.885 kg)  BMI 20.14 kg/m2  SpO2 100% Body mass index is 20.14 kg/(m^2). General: pleasant, WD, WN white female who is laying in bed in NAD HEENT: head is normocephalic, atraumatic.  Sclera are noninjected.  PERRL.  Ears and nose without any masses or lesions.  Mouth is pink and moist Heart: regular, rate, and rhythm.  Normal s1,s2. No obvious murmurs, gallops, or rubs noted.  Palpable radial and pedal pulses bilaterally Lungs: CTAB, no wheezes, rhonchi, or rales noted.  Respiratory effort nonlabored Abd: soft, NT, ND, +BS, no masses, hernias, or organomegaly, midline incision healing.  LLQ colostomy in place with good output.  Stoma is pink. MS: all 4 extremities are symmetrical with no cyanosis, clubbing.  Minimal edema Psych: A&Ox3 with an appropriate affect.   Labs: Pending  Imaging: No results found.  Assessment/Plan 1. Colon carcinoma -we will plan for [facility] placement today -labs are pending.  Her vitals have been reviewed -Risks and Benefits discussed with the patient including, but not limited to bleeding, infection, pneumothorax, or fibrin sheath development and need for additional procedures. All of the patient's questions were answered, patient is agreeable to proceed. Consent signed and in chart.  Thank you for this interesting consult.  I greatly enjoyed meeting Carolyn Barton and look forward to participating in their care.  A copy of this report was sent to the requesting provider on this date.  Electronically Signed: Henreitta Cea 04/10/2016, 11:36 AM   I spent a total of  30 Minutes   in face to face in clinical consultation, greater than 50% of which was counseling/coordinating care for colon carcinoma, needs PAC

## 2016-04-10 NOTE — Discharge Instructions (Signed)
Implanted Port Insertion, Care After °Refer to this sheet in the next few weeks. These instructions provide you with information on caring for yourself after your procedure. Your health care provider may also give you more specific instructions. Your treatment has been planned according to current medical practices, but problems sometimes occur. Call your health care provider if you have any problems or questions after your procedure. °WHAT TO EXPECT AFTER THE PROCEDURE °After your procedure, it is typical to have the following:  °· Discomfort at the port insertion site. Ice packs to the area will help. °· Bruising on the skin over the port. This will subside in 3-4 days. °HOME CARE INSTRUCTIONS °· After your port is placed, you will get a manufacturer's information card. The card has information about your port. Keep this card with you at all times.   °· Know what kind of port you have. There are many types of ports available.   °· Wear a medical alert bracelet in case of an emergency. This can help alert health care workers that you have a port.   °· The port can stay in for as long as your health care provider believes it is necessary.   °· A home health care nurse may give medicines and take care of the port.   °· You or a family member can get special training and directions for giving medicine and taking care of the port at home.   °SEEK MEDICAL CARE IF:  °· Your port does not flush or you are unable to get a blood return.   °· You have a fever or chills. °SEEK IMMEDIATE MEDICAL CARE IF: °· You have new fluid or pus coming from your incision.   °· You notice a bad smell coming from your incision site.   °· You have swelling, pain, or more redness at the incision or port site.   °· You have chest pain or shortness of breath. °  °This information is not intended to replace advice given to you by your health care provider. Make sure you discuss any questions you have with your health care provider. °  °Document  Released: 07/06/2013 Document Revised: 09/20/2013 Document Reviewed: 07/06/2013 °Elsevier Interactive Patient Education ©2016 [employer] ° °

## 2016-04-10 NOTE — Procedures (Signed)
Interventional Radiology Procedure Note  Procedure: Placement of a right IJ approach single lumen PowerPort.  Tip is positioned at the superior cavoatrial junction and catheter is ready for immediate use.  Complications: No immediate Recommendations:  - Ok to shower tomorrow - Do not submerge for 7 days - Routine line care   Signed,  Roselynne Lortz S. Everli Rother, DO    

## 2016-04-12 ENCOUNTER — Other Ambulatory Visit: Payer: Self-pay | Admitting: Oncology

## 2016-04-14 ENCOUNTER — Encounter: Payer: Self-pay | Admitting: *Deleted

## 2016-04-14 ENCOUNTER — Other Ambulatory Visit: Payer: Self-pay | Admitting: Nurse Practitioner

## 2016-04-14 ENCOUNTER — Encounter: Payer: Self-pay | Admitting: Nurse Practitioner

## 2016-04-14 ENCOUNTER — Telehealth: Payer: Self-pay | Admitting: Medical Oncology

## 2016-04-14 ENCOUNTER — Ambulatory Visit (HOSPITAL_BASED_OUTPATIENT_CLINIC_OR_DEPARTMENT_OTHER): Payer: BLUE CROSS/BLUE SHIELD | Admitting: Nurse Practitioner

## 2016-04-14 ENCOUNTER — Ambulatory Visit: Payer: BLUE CROSS/BLUE SHIELD

## 2016-04-14 ENCOUNTER — Other Ambulatory Visit (HOSPITAL_BASED_OUTPATIENT_CLINIC_OR_DEPARTMENT_OTHER): Payer: BLUE CROSS/BLUE SHIELD

## 2016-04-14 ENCOUNTER — Ambulatory Visit (HOSPITAL_BASED_OUTPATIENT_CLINIC_OR_DEPARTMENT_OTHER): Payer: BLUE CROSS/BLUE SHIELD

## 2016-04-14 VITALS — BP 127/78 | HR 106 | Temp 98.0°F | Resp 18 | Ht 65.0 in | Wt 120.7 lb

## 2016-04-14 VITALS — BP 113/70 | HR 95 | Resp 18

## 2016-04-14 DIAGNOSIS — R630 Anorexia: Secondary | ICD-10-CM

## 2016-04-14 DIAGNOSIS — Z95828 Presence of other vascular implants and grafts: Secondary | ICD-10-CM | POA: Insufficient documentation

## 2016-04-14 DIAGNOSIS — C19 Malignant neoplasm of rectosigmoid junction: Secondary | ICD-10-CM

## 2016-04-14 DIAGNOSIS — D509 Iron deficiency anemia, unspecified: Secondary | ICD-10-CM | POA: Diagnosis not present

## 2016-04-14 DIAGNOSIS — C20 Malignant neoplasm of rectum: Secondary | ICD-10-CM

## 2016-04-14 DIAGNOSIS — Z5111 Encounter for antineoplastic chemotherapy: Secondary | ICD-10-CM | POA: Diagnosis not present

## 2016-04-14 DIAGNOSIS — C787 Secondary malignant neoplasm of liver and intrahepatic bile duct: Secondary | ICD-10-CM

## 2016-04-14 DIAGNOSIS — C786 Secondary malignant neoplasm of retroperitoneum and peritoneum: Secondary | ICD-10-CM

## 2016-04-14 DIAGNOSIS — Z5112 Encounter for antineoplastic immunotherapy: Secondary | ICD-10-CM | POA: Diagnosis not present

## 2016-04-14 DIAGNOSIS — R634 Abnormal weight loss: Secondary | ICD-10-CM

## 2016-04-14 HISTORY — DX: Iron deficiency anemia, unspecified: D50.9

## 2016-04-14 LAB — COMPREHENSIVE METABOLIC PANEL
ALT: <9 U/L (ref 0–55)
AST: 13 U/L (ref 5–34)
Albumin: 2.3 g/dL — ABNORMAL LOW (ref 3.5–5.0)
Alkaline Phosphatase: 84 U/L (ref 40–150)
Anion Gap: 8 mEq/L (ref 3–11)
BUN: 6.8 mg/dL — AB (ref 7.0–26.0)
CO2: 31 meq/L — AB (ref 22–29)
Calcium: 8.9 mg/dL (ref 8.4–10.4)
Chloride: 99 mEq/L (ref 98–109)
Creatinine: 0.6 mg/dL (ref 0.6–1.1)
GLUCOSE: 106 mg/dL (ref 70–140)
POTASSIUM: 4 meq/L (ref 3.5–5.1)
SODIUM: 137 meq/L (ref 136–145)
TOTAL PROTEIN: 6.9 g/dL (ref 6.4–8.3)
Total Bilirubin: <0.3 mg/dL (ref 0.20–1.20)

## 2016-04-14 LAB — CBC WITH DIFFERENTIAL/PLATELET
BASO%: 1 % (ref 0.0–2.0)
Basophils Absolute: 0.1 10*3/uL (ref 0.0–0.1)
EOS ABS: 0.8 10*3/uL — AB (ref 0.0–0.5)
EOS%: 7 % (ref 0.0–7.0)
HCT: 27.2 % — ABNORMAL LOW (ref 34.8–46.6)
HGB: 8.4 g/dL — ABNORMAL LOW (ref 11.6–15.9)
LYMPH%: 11.9 % — AB (ref 14.0–49.7)
MCH: 20.6 pg — ABNORMAL LOW (ref 25.1–34.0)
MCHC: 30.8 g/dL — ABNORMAL LOW (ref 31.5–36.0)
MCV: 66.8 fL — ABNORMAL LOW (ref 79.5–101.0)
MONO#: 0.7 10*3/uL (ref 0.1–0.9)
MONO%: 6.3 % (ref 0.0–14.0)
NEUT%: 73.8 % (ref 38.4–76.8)
NEUTROS ABS: 8.1 10*3/uL — AB (ref 1.5–6.5)
Platelets: 693 10*3/uL — ABNORMAL HIGH (ref 145–400)
RBC: 4.08 10*6/uL (ref 3.70–5.45)
RDW: 19 % — AB (ref 11.2–14.5)
WBC: 11 10*3/uL — AB (ref 3.9–10.3)
lymph#: 1.3 10*3/uL (ref 0.9–3.3)

## 2016-04-14 MED ORDER — SODIUM CHLORIDE 0.9 % IV SOLN
510.0000 mg | Freq: Once | INTRAVENOUS | Status: AC
  Administered 2016-04-14: 510 mg via INTRAVENOUS
  Filled 2016-04-14: qty 17

## 2016-04-14 MED ORDER — ATROPINE SULFATE 1 MG/ML IJ SOLN
0.5000 mg | Freq: Once | INTRAMUSCULAR | Status: AC | PRN
  Administered 2016-04-14: 0.5 mg via INTRAVENOUS

## 2016-04-14 MED ORDER — LEUCOVORIN CALCIUM INJECTION 350 MG
400.0000 mg/m2 | Freq: Once | INTRAMUSCULAR | Status: AC
  Administered 2016-04-14: 644 mg via INTRAVENOUS
  Filled 2016-04-14: qty 32.2

## 2016-04-14 MED ORDER — ATROPINE SULFATE 1 MG/ML IJ SOLN
INTRAMUSCULAR | Status: AC
  Filled 2016-04-14: qty 1

## 2016-04-14 MED ORDER — SODIUM CHLORIDE 0.9 % IV SOLN
2400.0000 mg/m2 | INTRAVENOUS | Status: DC
  Administered 2016-04-14: 3850 mg via INTRAVENOUS
  Filled 2016-04-14: qty 77

## 2016-04-14 MED ORDER — SODIUM CHLORIDE 0.9 % IV SOLN
Freq: Once | INTRAVENOUS | Status: AC
  Administered 2016-04-14: 12:00:00 via INTRAVENOUS

## 2016-04-14 MED ORDER — SODIUM CHLORIDE 0.9 % IV SOLN
Freq: Once | INTRAVENOUS | Status: AC
  Administered 2016-04-14: 16:00:00 via INTRAVENOUS

## 2016-04-14 MED ORDER — SODIUM CHLORIDE 0.9 % IV SOLN
10.0000 mg | Freq: Once | INTRAVENOUS | Status: AC
  Administered 2016-04-14: 10 mg via INTRAVENOUS
  Filled 2016-04-14: qty 1

## 2016-04-14 MED ORDER — MINOCYCLINE HCL 100 MG PO CAPS: 100.0000 mg | ORAL_CAPSULE | Freq: Two times a day (BID) | ORAL | Status: DC

## 2016-04-14 MED ORDER — PANITUMUMAB CHEMO INJECTION 100 MG/5ML
300.0000 mg | Freq: Once | INTRAVENOUS | Status: AC
  Administered 2016-04-14: 300 mg via INTRAVENOUS
  Filled 2016-04-14: qty 15

## 2016-04-14 MED ORDER — PALONOSETRON HCL INJECTION 0.25 MG/5ML
0.2500 mg | Freq: Once | INTRAVENOUS | Status: AC
  Administered 2016-04-14: 0.25 mg via INTRAVENOUS

## 2016-04-14 MED ORDER — PALONOSETRON HCL INJECTION 0.25 MG/5ML
INTRAVENOUS | Status: AC
  Filled 2016-04-14: qty 5

## 2016-04-14 MED ORDER — SODIUM CHLORIDE 0.9 % IJ SOLN
10.0000 mL | INTRAMUSCULAR | Status: DC | PRN
  Administered 2016-04-14: 10 mL via INTRAVENOUS
  Filled 2016-04-14: qty 10

## 2016-04-14 MED ORDER — FLUOROURACIL CHEMO INJECTION 2.5 GM/50ML
400.0000 mg/m2 | Freq: Once | INTRAVENOUS | Status: AC
  Administered 2016-04-14: 650 mg via INTRAVENOUS
  Filled 2016-04-14: qty 13

## 2016-04-14 MED ORDER — IRINOTECAN HCL CHEMO INJECTION 100 MG/5ML
174.0000 mg/m2 | Freq: Once | INTRAVENOUS | Status: AC
  Administered 2016-04-14: 280 mg via INTRAVENOUS
  Filled 2016-04-14: qty 10

## 2016-04-14 NOTE — Patient Instructions (Signed)
[facility] Discharge Instructions for Patients Receiving Chemotherapy  Today you received the following chemotherapy agents Vectibix, Irinotecan, Leucovorin, Adrucil  To help prevent nausea and vomiting after your treatment, we encourage you to take your nausea medication as directed.   If you develop nausea and vomiting that is not controlled by your nausea medication, call the clinic.   BELOW ARE SYMPTOMS THAT SHOULD BE REPORTED IMMEDIATELY:  *FEVER GREATER THAN 100.5 F  *CHILLS WITH OR WITHOUT FEVER  NAUSEA AND VOMITING THAT IS NOT CONTROLLED WITH YOUR NAUSEA MEDICATION  *UNUSUAL SHORTNESS OF BREATH  *UNUSUAL BRUISING OR BLEEDING  TENDERNESS IN MOUTH AND THROAT WITH OR WITHOUT PRESENCE OF ULCERS  *URINARY PROBLEMS  *BOWEL PROBLEMS  UNUSUAL RASH Items with * indicate a potential emergency and should be followed up as soon as possible.  Feel free to call the clinic you have any questions or concerns. The clinic phone number is [phone].  Please show the [facility] at check-in to the Emergency Department and triage nurse.  Ferumoxytol injection (Feraheme) IRON What is this medicine? FERUMOXYTOL is an iron complex. Iron is used to make healthy red blood cells, which carry oxygen and nutrients throughout the body. This medicine is used to treat iron deficiency anemia in people with chronic kidney disease. This medicine may be used for other purposes; ask your health care provider or pharmacist if you have questions. What should I tell my health care provider before I take this medicine? They need to know if you have any of these conditions: -anemia not caused by low iron levels -high levels of iron in the blood -magnetic resonance imaging (MRI) test scheduled -an unusual or allergic reaction to iron, other medicines, foods, dyes, or preservatives -pregnant or trying to get pregnant -breast-feeding How should I use this medicine? This medicine is  for injection into a vein. It is given by a health care professional in a hospital or clinic setting. Talk to your pediatrician regarding the use of this medicine in children. Special care may be needed. Overdosage: If you think you have taken too much of this medicine contact a poison control center or emergency room at once. NOTE: This medicine is only for you. Do not share this medicine with others. What if I miss a dose? It is important not to miss your dose. Call your doctor or health care professional if you are unable to keep an appointment. What may interact with this medicine? This medicine may interact with the following medications: -other iron products This list may not describe all possible interactions. Give your health care provider a list of all the medicines, herbs, non-prescription drugs, or dietary supplements you use. Also tell them if you smoke, drink alcohol, or use illegal drugs. Some items may interact with your medicine. What should I watch for while using this medicine? Visit your doctor or healthcare professional regularly. Tell your doctor or healthcare professional if your symptoms do not start to get better or if they get worse. You may need blood work done while you are taking this medicine. You may need to follow a special diet. Talk to your doctor. Foods that contain iron include: whole grains/cereals, dried fruits, beans, or peas, leafy green vegetables, and organ meats (liver, kidney). What side effects may I notice from receiving this medicine? Side effects that you should report to your doctor or health care professional as soon as possible: -allergic reactions like skin rash, itching or hives, swelling of the face,  lips, or tongue -breathing problems -changes in blood pressure -feeling faint or lightheaded, falls -fever or chills -flushing, sweating, or hot feelings -swelling of the ankles or feet Side effects that usually do not require medical attention  (Report these to your doctor or health care professional if they continue or are bothersome.): -diarrhea -headache -nausea, vomiting -stomach pain This list may not describe all possible side effects. Call your doctor for medical advice about side effects. You may report side effects to FDA at 1-800-FDA-1088. Where should I keep my medicine? This drug is given in a hospital or clinic and will not be stored at home. NOTE: This sheet is a summary. It may not cover all possible information. If you have questions about this medicine, talk to your doctor, pharmacist, or health care provider.    2016, Elsevier/Gold Standard. (2012-04-30 15:23:36)    Panitumumab Solution for Injection What is this medicine? PANITUMUMAB (pan i TOOM ue mab) is a monoclonal antibody. It is used to treat colorectal cancer. This medicine may be used for other purposes; ask your health care provider or pharmacist if you have questions. What should I tell my health care provider before I take this medicine? They need to know if you have any of these conditions: -lung disease, especially lung fibrosis -skin conditions or sensitivity -an unusual or allergic reaction to panitumumab, mouse proteins, other medicines, foods, dyes, or preservatives -pregnant or trying to get pregnant -breast-feeding How should I use this medicine? This drug is given as an infusion into a vein. It is administered in a hospital or clinic by a specially trained health care professional. Talk to your pediatrician regarding the use of this medicine in children. Special care may be needed. Overdosage: If you think you have taken too much of this medicine contact a poison control center or emergency room at once. NOTE: This medicine is only for you. Do not share this medicine with others. What if I miss a dose? It is important not to miss your dose. Call your doctor or health care professional if you are unable to keep an appointment. What may  interact with this medicine? -some medicines for cancer This list may not describe all possible interactions. Give your health care provider a list of all the medicines, herbs, non-prescription drugs, or dietary supplements you use. Also tell them if you smoke, drink alcohol, or use illegal drugs. Some items may interact with your medicine. What should I watch for while using this medicine? Visit your doctor for checks on your progress. This drug may make you feel generally unwell. This is not uncommon, as chemotherapy can affect healthy cells as well as cancer cells. Report any side effects. Continue your course of treatment even though you feel ill unless your doctor tells you to stop. This medicine can make you more sensitive to the sun. Keep out of the sun while receiving this medicine and for 2 months after the last dose. If you cannot avoid being in the sun, wear protective clothing and use sunscreen. Do not use sun lamps or tanning beds/booths. In some cases, you may be given additional medicines to help with side effects. Follow all directions for their use. Call your doctor or health care professional for advice if you get a fever, chills or sore throat, or other symptoms of a cold or flu. Do not treat yourself. This drug decreases your body's ability to fight infections. Try to avoid being around people who are sick. Avoid taking products that contain  aspirin, acetaminophen, ibuprofen, naproxen, or ketoprofen unless instructed by your doctor. These medicines may hide a fever. Do not become pregnant while taking this medicine and for 6 months after the last dose. Women should inform their doctor if they wish to become pregnant or think they might be pregnant. Men should not father a child while taking this medicine and for 6 months after the last dose. There is a potential for serious side effects to an unborn child. Talk to your health care professional or pharmacist for more information. Do not  breast-feed an infant while taking this medicine. What side effects may I notice from receiving this medicine? Side effects that you should report to your doctor or health care professional as soon as possible: -allergic reactions like skin rash, itching or hives, swelling of the face, lips, or tongue -breathing problems -changes in vision -fast, irregular heartbeat -feeling faint or lightheaded, falls -fever, chills -mouth sores -swelling of the ankles, feet, hands -unusually weak or tired Side effects that usually do not require medical attention (report to your doctor or health care professional if they continue or are bothersome): -changes in skin like acne, cracks, skin dryness -constipation -diarrhea -eyelash growth -headache -nail changes -nausea, vomiting -stomach upset This list may not describe all possible side effects. Call your doctor for medical advice about side effects. You may report side effects to FDA at 1-800-FDA-1088. Where should I keep my medicine? This drug is given in a hospital or clinic and will not be stored at home. NOTE: This sheet is a summary. It may not cover all possible information. If you have questions about this medicine, talk to your doctor, pharmacist, or health care provider.    2016, Elsevier/Gold Standard. (2014-11-14 17:21:33)  Irinotecan injection What is this medicine? IRINOTECAN (ir in oh TEE kan ) is a chemotherapy drug. It is used to treat colon and rectal cancer. This medicine may be used for other purposes; ask your health care provider or pharmacist if you have questions. What should I tell my health care provider before I take this medicine? They need to know if you have any of these conditions: -blood disorders -dehydration -diarrhea -infection (especially a virus infection such as chickenpox, cold sores, or herpes) -liver disease -low blood counts, like low white cell, platelet, or red cell counts -recent or ongoing  radiation therapy -an unusual or allergic reaction to irinotecan, sorbitol, other chemotherapy, other medicines, foods, dyes, or preservatives -pregnant or trying to get pregnant -breast-feeding How should I use this medicine? This drug is given as an infusion into a vein. It is administered in a hospital or clinic by a specially trained health care professional. Talk to your pediatrician regarding the use of this medicine in children. Special care may be needed. Overdosage: If you think you have taken too much of this medicine contact a poison control center or emergency room at once. NOTE: This medicine is only for you. Do not share this medicine with others. What if I miss a dose? It is important not to miss your dose. Call your doctor or health care professional if you are unable to keep an appointment. What may interact with this medicine? Do not take this medicine with any of the following medications: -atazanavir -certain medicines for fungal infections like itraconazole and ketoconazole -St. John's Wort This medicine may also interact with the following medications: -dexamethasone -diuretics -laxatives -medicines for seizures like carbamazepine, mephobarbital, phenobarbital, phenytoin, primidone -medicines to increase blood counts like filgrastim, pegfilgrastim, sargramostim -  prochlorperazine -vaccines This list may not describe all possible interactions. Give your health care provider a list of all the medicines, herbs, non-prescription drugs, or dietary supplements you use. Also tell them if you smoke, drink alcohol, or use illegal drugs. Some items may interact with your medicine. What should I watch for while using this medicine? Your condition will be monitored carefully while you are receiving this medicine. You will need important blood work done while you are taking this medicine. This drug may make you feel generally unwell. This is not uncommon, as chemotherapy can affect  healthy cells as well as cancer cells. Report any side effects. Continue your course of treatment even though you feel ill unless your doctor tells you to stop. In some cases, you may be given additional medicines to help with side effects. Follow all directions for their use. You may get drowsy or dizzy. Do not drive, use machinery, or do anything that needs mental alertness until you know how this medicine affects you. Do not stand or sit up quickly, especially if you are an older patient. This reduces the risk of dizzy or fainting spells. Call your doctor or health care professional for advice if you get a fever, chills or sore throat, or other symptoms of a cold or flu. Do not treat yourself. This drug decreases your body's ability to fight infections. Try to avoid being around people who are sick. This medicine may increase your risk to bruise or bleed. Call your doctor or health care professional if you notice any unusual bleeding. Be careful brushing and flossing your teeth or using a toothpick because you may get an infection or bleed more easily. If you have any dental work done, tell your dentist you are receiving this medicine. Avoid taking products that contain aspirin, acetaminophen, ibuprofen, naproxen, or ketoprofen unless instructed by your doctor. These medicines may hide a fever. Do not become pregnant while taking this medicine. Women should inform their doctor if they wish to become pregnant or think they might be pregnant. There is a potential for serious side effects to an unborn child. Talk to your health care professional or pharmacist for more information. Do not breast-feed an infant while taking this medicine. What side effects may I notice from receiving this medicine? Side effects that you should report to your doctor or health care professional as soon as possible: -allergic reactions like skin rash, itching or hives, swelling of the face, lips, or tongue -low blood counts -  this medicine may decrease the number of white blood cells, red blood cells and platelets. You may be at increased risk for infections and bleeding. -signs of infection - fever or chills, cough, sore throat, pain or difficulty passing urine -signs of decreased platelets or bleeding - bruising, pinpoint red spots on the skin, black, tarry stools, blood in the urine -signs of decreased red blood cells - unusually weak or tired, fainting spells, lightheadedness -breathing problems -chest pain -diarrhea -feeling faint or lightheaded, falls -flushing, runny nose, sweating during infusion -mouth sores or pain -pain, swelling, redness or irritation where injected -pain, swelling, warmth in the leg -pain, tingling, numbness in the hands or feet -problems with balance, talking, walking -stomach cramps, pain -trouble passing urine or change in the amount of urine -vomiting as to be unable to hold down drinks or food -yellowing of the eyes or skin Side effects that usually do not require medical attention (report to your doctor or health care professional if they continue  or are bothersome): -constipation -hair loss -headache -loss of appetite -nausea, vomiting -stomach upset This list may not describe all possible side effects. Call your doctor for medical advice about side effects. You may report side effects to FDA at 1-800-FDA-1088. Where should I keep my medicine? This drug is given in a hospital or clinic and will not be stored at home. NOTE: This sheet is a summary. It may not cover all possible information. If you have questions about this medicine, talk to your doctor, pharmacist, or health care provider.    2016, Elsevier/Gold Standard. (2013-03-14 16:29:32)   Leucovorin injection What is this medicine? LEUCOVORIN (loo koe VOR in) is used to prevent or treat the harmful effects of some medicines. This medicine is used to treat anemia caused by a low amount of folic acid in the  body. It is also used with 5-fluorouracil (5-FU) to treat colon cancer. This medicine may be used for other purposes; ask your health care provider or pharmacist if you have questions. What should I tell my health care provider before I take this medicine? They need to know if you have any of these conditions: -anemia from low levels of vitamin B-12 in the blood -an unusual or allergic reaction to leucovorin, folic acid, other medicines, foods, dyes, or preservatives -pregnant or trying to get pregnant -breast-feeding How should I use this medicine? This medicine is for injection into a muscle or into a vein. It is given by a health care professional in a hospital or clinic setting. Talk to your pediatrician regarding the use of this medicine in children. Special care may be needed. Overdosage: If you think you have taken too much of this medicine contact a poison control center or emergency room at once. NOTE: This medicine is only for you. Do not share this medicine with others. What if I miss a dose? This does not apply. What may interact with this medicine? -capecitabine -fluorouracil -phenobarbital -phenytoin -primidone -trimethoprim-sulfamethoxazole This list may not describe all possible interactions. Give your health care provider a list of all the medicines, herbs, non-prescription drugs, or dietary supplements you use. Also tell them if you smoke, drink alcohol, or use illegal drugs. Some items may interact with your medicine. What should I watch for while using this medicine? Your condition will be monitored carefully while you are receiving this medicine. This medicine may increase the side effects of 5-fluorouracil, 5-FU. Tell your doctor or health care professional if you have diarrhea or mouth sores that do not get better or that get worse. What side effects may I notice from receiving this medicine? Side effects that you should report to your doctor or health care  professional as soon as possible: -allergic reactions like skin rash, itching or hives, swelling of the face, lips, or tongue -breathing problems -fever, infection -mouth sores -unusual bleeding or bruising -unusually weak or tired Side effects that usually do not require medical attention (report to your doctor or health care professional if they continue or are bothersome): -constipation or diarrhea -loss of appetite -nausea, vomiting This list may not describe all possible side effects. Call your doctor for medical advice about side effects. You may report side effects to FDA at 1-800-FDA-1088. Where should I keep my medicine? This drug is given in a hospital or clinic and will not be stored at home. NOTE: This sheet is a summary. It may not cover all possible information. If you have questions about this medicine, talk to your doctor, pharmacist, or  health care provider.    2016, Else[employer]/Gold Standard. (2008-03-21 16:50:29)  Fluorouracil, 5-FU injection What is this medicine? FLUOROURACIL, 5-FU (flure oh YOOR a sil) is a chemotherapy drug. It slows the growth of cancer cells. This medicine is used to treat many types of cancer like breast cancer, colon or rectal cancer, pancreatic cancer, and stomach cancer. This medicine may be used for other purposes; ask your health care provider or pharmacist if you have questions. What should I tell my health care provider before I take this medicine? They need to know if you have any of these conditions: -blood disorders -dihydropyrimidine dehydrogenase (DPD) deficiency -infection (especially a virus infection such as chickenpox, cold sores, or herpes) -kidney disease -liver disease -malnourished, poor nutrition -recent or ongoing radiation therapy -an unusual or allergic reaction to fluorouracil, other chemotherapy, other medicines, foods, dyes, or preservatives -pregnant or trying to get pregnant -breast-feeding How should I use this  medicine? This drug is given as an infusion or injection into a vein. It is administered in a hospital or clinic by a specially trained health care professional. Talk to your pediatrician regarding the use of this medicine in children. Special care may be needed. Overdosage: If you think you have taken too much of this medicine contact a poison control center or emergency room at once. NOTE: This medicine is only for you. Do not share this medicine with others. What if I miss a dose? It is important not to miss your dose. Call your doctor or health care professional if you are unable to keep an appointment. What may interact with this medicine? -allopurinol -cimetidine -dapsone -digoxin -hydroxyurea -leucovorin -levamisole -medicines for seizures like ethotoin, fosphenytoin, phenytoin -medicines to increase blood counts like filgrastim, pegfilgrastim, sargramostim -medicines that treat or prevent blood clots like warfarin, enoxaparin, and dalteparin -methotrexate -metronidazole -pyrimethamine -some other chemotherapy drugs like busulfan, cisplatin, estramustine, vinblastine -trimethoprim -trimetrexate -vaccines Talk to your doctor or health care professional before taking any of these medicines: -acetaminophen -aspirin -ibuprofen -ketoprofen -naproxen This list may not describe all possible interactions. Give your health care provider a list of all the medicines, herbs, non-prescription drugs, or dietary supplements you use. Also tell them if you smoke, drink alcohol, or use illegal drugs. Some items may interact with your medicine. What should I watch for while using this medicine? Visit your doctor for checks on your progress. This drug may make you feel generally unwell. This is not uncommon, as chemotherapy can affect healthy cells as well as cancer cells. Report any side effects. Continue your course of treatment even though you feel ill unless your doctor tells you to stop. In  some cases, you may be given additional medicines to help with side effects. Follow all directions for their use. Call your doctor or health care professional for advice if you get a fever, chills or sore throat, or other symptoms of a cold or flu. Do not treat yourself. This drug decreases your body's ability to fight infections. Try to avoid being around people who are sick. This medicine may increase your risk to bruise or bleed. Call your doctor or health care professional if you notice any unusual bleeding. Be careful brushing and flossing your teeth or using a toothpick because you may get an infection or bleed more easily. If you have any dental work done, tell your dentist you are receiving this medicine. Avoid taking products that contain aspirin, acetaminophen, ibuprofen, naproxen, or ketoprofen unless instructed by your doctor. These medicines may hide  a fever. Do not become pregnant while taking this medicine. Women should inform their doctor if they wish to become pregnant or think they might be pregnant. There is a potential for serious side effects to an unborn child. Talk to your health care professional or pharmacist for more information. Do not breast-feed an infant while taking this medicine. Men should inform their doctor if they wish to father a child. This medicine may lower sperm counts. Do not treat diarrhea with over the counter products. Contact your doctor if you have diarrhea that lasts more than 2 days or if it is severe and watery. This medicine can make you more sensitive to the sun. Keep out of the sun. If you cannot avoid being in the sun, wear protective clothing and use sunscreen. Do not use sun lamps or tanning beds/booths. What side effects may I notice from receiving this medicine? Side effects that you should report to your doctor or health care professional as soon as possible: -allergic reactions like skin rash, itching or hives, swelling of the face, lips, or  tongue -low blood counts - this medicine may decrease the number of white blood cells, red blood cells and platelets. You may be at increased risk for infections and bleeding. -signs of infection - fever or chills, cough, sore throat, pain or difficulty passing urine -signs of decreased platelets or bleeding - bruising, pinpoint red spots on the skin, black, tarry stools, blood in the urine -signs of decreased red blood cells - unusually weak or tired, fainting spells, lightheadedness -breathing problems -changes in vision -chest pain -mouth sores -nausea and vomiting -pain, swelling, redness at site where injected -pain, tingling, numbness in the hands or feet -redness, swelling, or sores on hands or feet -stomach pain -unusual bleeding Side effects that usually do not require medical attention (report to your doctor or health care professional if they continue or are bothersome): -changes in finger or toe nails -diarrhea -dry or itchy skin -hair loss -headache -loss of appetite -sensitivity of eyes to the light -stomach upset -unusually teary eyes This list may not describe all possible side effects. Call your doctor for medical advice about side effects. You may report side effects to FDA at 1-800-FDA-1088. Where should I keep my medicine? This drug is given in a hospital or clinic and will not be stored at home. NOTE: This sheet is a summary. It may not cover all possible information. If you have questions about this medicine, talk to your doctor, pharmacist, or health care provider.    2016, Elsevier/Gold Standard. (2008-01-19 13:53:16)

## 2016-04-14 NOTE — Telephone Encounter (Signed)
err

## 2016-04-14 NOTE — Patient Instructions (Signed)

## 2016-04-14 NOTE — Progress Notes (Signed)
Ok to treat with HR 106

## 2016-04-14 NOTE — Progress Notes (Signed)
Oncology Nurse Navigator Documentation  Oncology Nurse Navigator Flowsheets 04/14/2016  Navigator Location [facility]  Navigator Encounter Type Treatment  Telephone -  Abnormal Finding Date -  Confirmed Diagnosis Date -  Surgery Date -  Treatment Initiated Date 04/14/2016  Patient Visit Type MedOnc  Treatment Phase First Chemo Tx--FOLFIRI/Panitumumab  Barriers/Navigation Needs Coordination of Care--Nutrition need  Education -Confirmed she has her Imodium at home and understands how to take it if necessary.  Interventions Coordination of Care-scheduled appointment with dietician  Referrals -  Coordination of Care Appts--discucssed available appointments with dietician with patient and she has decided upon 7/25 at 3:15pm  Education Method -  Support Groups/Services Other--provided information on [place]  Acuity Level 2  Time Spent with Patient 15  Reports that [place] comes weekly to check on her. Her husband and herself are doing well with the ostomy care. She hopes it can be reversed one day, but says she'll be OK if not.

## 2016-04-14 NOTE — Progress Notes (Addendum)
Patient reported cramping in abdomen.  First time dose of Irinotecan was in process.  Administered Atropine 0.5 mg.  Patient reported improvement within the first 10 minutes after receiving.

## 2016-04-14 NOTE — Progress Notes (Addendum)
  [place] OFFICE PROGRESS NOTE   Diagnosis: Colon cancer   INTERVAL HISTORY:   Ms. Berzins returns as scheduled. She denies nausea/vomiting. No mouth sores. No diarrhea. She denies pain. She describes her appetite as poor. Midline wound continues to heal.  Objective:  Vital signs in last 24 hours:  Blood pressure 127/78, pulse 106, temperature 98 F (36.7 C), temperature source Oral, resp. rate 18, height _0  (1.651 m), weight 120 lb 11.2 oz (54.749 kg), SpO2 100 %.    HEENT: No thrush or ulcers. Resp: Lungs clear bilaterally. Cardio: Regular rate and rhythm. GI: Left lower quadrant colostomy. Open midline wound with packing in place. Wound appears healthy. No surrounding erythema. Vascular: No leg edema. Port-A-Cath puncture site and skin overlying Port-A-Cath mildly erythematous.  Lab Results:  Lab Results  Component Value Date   WBC 11.0* 04/14/2016   HGB 8.4* 04/14/2016   HCT 27.2* 04/14/2016   MCV 66.8* 04/14/2016   PLT 693* 04/14/2016   NEUTROABS 8.1* 04/14/2016    Imaging:  No results found.  Medications: I have reviewed the patient's current medications.  Assessment/Plan: 1. Metastatic colorectal cancer-status post an exploratory laparotomy 03/03/2016 revealing a proximal rectal mass, peritoneal carcinomatosis, and liver metastases  Biopsy of peritoneal nodules 03/03/2016 confirmed metastatic adenocarcinoma consistent with a colon primary  Foundation 1 testing-MSI-stable, tumor mutation burden-low, no BRAF NRAS or KRAS mutation  Cycle 1 FOLFIRI/PANITUMUMAB 04/14/2016  2. Bowel obstruction secondary to #1  3. Chronic back pain  4. Essential thrombocytosis  5. Early obstruction of the left ureter noted at the time of surgery 03/03/2016-Dr. Tannenbaum placed a left double-J stent 03/18/2016  6. Abdominal wall cellulitis 03/10/2016, blood cultures positive for coagulase negative staphylococcus-methicillin-resistant, status post  treatment with vancomycin and Bactrim  7. Tachycardia-persistent following discharge from the hospital, likely related to anemia and deconditioning  CT chest 04/04/2016-negative for pulmonary embolism.  8.  Port-A-Cath placement 04/10/2016, interventional radiology  9.  Iron deficiency anemia. Feraheme 11/05/2015 and 11/12/2015.   Disposition: Ms. Dearinger appears stable. Plan to proceed with cycle 1 FOLFIRI/PANITUMUMAB today as scheduled. Potential toxicities again reviewed. Questions answered. Prescription sent to her pharmacy for minocycline. She is in agreement with the above plan.  She has a microcytic anemia consistent with iron deficiency. She has received Feraheme in the past. She will receive Feraheme today and again in 2 weeks.  The Port-A-Cath site is mildly erythematous. She understands to contact the office if this worsens or she develops fever/chills.  Midline wound continues to heal. She will continue daily dressing changes.  Appetite is poor. She has lost weight. We made a referral to the [facility] dietitian.  She will return for a follow-up visit and cycle 2 FOLFIRI/PANITUMUMAB in 2 weeks. She will contact the office in the interim as outlined above or with any other problems.  Patient seen with Dr. Benay Spice. 25 minutes were spent face-to-face at today's visit with the majority of that time involved in counseling/coordination of care.    Ned Card ANP/GNP-BC   04/14/2016  11:53 AM  This was a shared visit with Ned Card. The abdominal wound is healing. The plan is to begin FOLFIRI/panitumumab today. She will be treated with IV iron.  [name], M.D.

## 2016-04-14 NOTE — Progress Notes (Signed)
Carolyn Barton took her own  Methadone 10 mg.

## 2016-04-15 ENCOUNTER — Telehealth: Payer: Self-pay | Admitting: *Deleted

## 2016-04-15 ENCOUNTER — Telehealth: Payer: Self-pay

## 2016-04-15 LAB — CEA: CEA1: 7.6 ng/mL — AB (ref 0.0–4.7)

## 2016-04-15 NOTE — Telephone Encounter (Signed)
Pt returned call, left message stating she is "doing just fine." Voiced appreciation for follow up call. She will be in for pump disconnect on 7/19.

## 2016-04-15 NOTE — Telephone Encounter (Signed)
Called MARTRICE JABLONOWSKI at [phone].  Message left requesting a return call for chemotherapy follow up.  Awaiting return call from patient.

## 2016-04-15 NOTE — Telephone Encounter (Signed)
Called Carolyn Barton for chemotherapy F/U.  Left message for pt to call if they have any questions or concerns or any new symptoms to report, along with the phone number.

## 2016-04-15 NOTE — Telephone Encounter (Signed)
-----   Message from Wilmon Arms, RN sent at 04/14/2016  1:07 PM EDT ----- Regarding: Dr. Ammie Dalton 1st time vectibix, irinotecan, leucovorin, 5FU Contact: [phone] 1st time 04/14/2016 Vectibix, Irinotecan, Leucovorin, 5FU,

## 2016-04-15 NOTE — Telephone Encounter (Signed)
-----   Message from Wilmon Arms, RN sent at 04/14/2016  1:07 PM EDT ----- Regarding: Dr. Ammie Dalton 1st time vectibix, irinotecan, leucovorin, 5FU Contact: [phone] 1st time 04/14/2016 Vectibix, Irinotecan, Leucovorin, 5FU,

## 2016-04-16 ENCOUNTER — Ambulatory Visit (HOSPITAL_BASED_OUTPATIENT_CLINIC_OR_DEPARTMENT_OTHER): Payer: BLUE CROSS/BLUE SHIELD

## 2016-04-16 VITALS — BP 126/89 | HR 105 | Temp 98.7°F | Resp 18

## 2016-04-16 DIAGNOSIS — C20 Malignant neoplasm of rectum: Secondary | ICD-10-CM | POA: Diagnosis not present

## 2016-04-16 DIAGNOSIS — C787 Secondary malignant neoplasm of liver and intrahepatic bile duct: Secondary | ICD-10-CM | POA: Diagnosis not present

## 2016-04-16 DIAGNOSIS — C786 Secondary malignant neoplasm of retroperitoneum and peritoneum: Secondary | ICD-10-CM | POA: Diagnosis not present

## 2016-04-16 MED ORDER — SODIUM CHLORIDE 0.9% FLUSH
10.0000 mL | INTRAVENOUS | Status: DC | PRN
  Administered 2016-04-16: 10 mL
  Filled 2016-04-16: qty 10

## 2016-04-16 MED ORDER — HEPARIN SOD (PORK) LOCK FLUSH 100 UNIT/ML IV SOLN
500.0000 [IU] | Freq: Once | INTRAVENOUS | Status: AC | PRN
Start: 2016-04-16 — End: 2016-04-16
  Administered 2016-04-16: 500 [IU]
  Filled 2016-04-16: qty 5

## 2016-04-22 ENCOUNTER — Ambulatory Visit: Payer: BLUE CROSS/BLUE SHIELD | Admitting: Nutrition

## 2016-04-22 NOTE — Progress Notes (Signed)
60 year old female diagnosed with metastatic colon cancer status post exploratory lap and colostomy.  Past medical history includes postop nausea vomiting, anemia.  Medications include Cymbalta, Lasix, and Compazine.  Labs include albumin 2.3.  Height: 65 inches. Weight: 120.7 pounds. Usual body weight: 138 pounds February 2017. BMI: 20.09.  Patient reports appetite is poor but she is experiencing some hunger. Reports good healing midline wound. Patient does not like oral nutrition supplements but will drink milk and milk shakes Patient requesting ideas for quick lunches and snacks.  Nutrition diagnosis:  Unintentional weight loss related to metastatic colon cancer and associated treatments as evidenced by 13% weight loss in less than 6 months.  Severe malnutrition related to 13% weight loss and severe loss of muscle and fat stores on physical exam.  Intervention: Patient educated to consume small frequent meals and snacks utilizing high-calorie, high-protein foods. Reviewed fact sheets specifying high-protein foods. Provided examples of healthy snacks and high-protein lunches. Encouraged patient to add one milk shake or smoothie daily.  Encouraged her to try to work in yogurt on a regular basis. Protein powder samples were provided at patient request. Questions were answered and teach back method used.  Contact information was given.  Monitoring, evaluation, goals: Patient will tolerate adequate calories and protein to minimize further weight loss and promote continued healing.  Next visit: Monday, August 14 during infusion.  **Disclaimer: This note was dictated with voice recognition software. Similar sounding words can inadvertently be transcribed and this note may contain transcription errors which may not have been corrected upon publication of note.**

## 2016-04-27 ENCOUNTER — Other Ambulatory Visit: Payer: Self-pay | Admitting: Oncology

## 2016-04-28 ENCOUNTER — Ambulatory Visit (HOSPITAL_BASED_OUTPATIENT_CLINIC_OR_DEPARTMENT_OTHER): Payer: BLUE CROSS/BLUE SHIELD

## 2016-04-28 ENCOUNTER — Ambulatory Visit (HOSPITAL_BASED_OUTPATIENT_CLINIC_OR_DEPARTMENT_OTHER): Payer: BLUE CROSS/BLUE SHIELD | Admitting: Oncology

## 2016-04-28 ENCOUNTER — Other Ambulatory Visit (HOSPITAL_BASED_OUTPATIENT_CLINIC_OR_DEPARTMENT_OTHER): Payer: BLUE CROSS/BLUE SHIELD

## 2016-04-28 ENCOUNTER — Ambulatory Visit: Payer: BLUE CROSS/BLUE SHIELD

## 2016-04-28 ENCOUNTER — Telehealth: Payer: Self-pay | Admitting: *Deleted

## 2016-04-28 ENCOUNTER — Telehealth: Payer: Self-pay | Admitting: Oncology

## 2016-04-28 VITALS — BP 141/87 | HR 100 | Temp 98.4°F | Resp 18 | Wt 114.9 lb

## 2016-04-28 DIAGNOSIS — C20 Malignant neoplasm of rectum: Secondary | ICD-10-CM

## 2016-04-28 DIAGNOSIS — Z5111 Encounter for antineoplastic chemotherapy: Secondary | ICD-10-CM | POA: Diagnosis not present

## 2016-04-28 DIAGNOSIS — D473 Essential (hemorrhagic) thrombocythemia: Secondary | ICD-10-CM | POA: Diagnosis not present

## 2016-04-28 DIAGNOSIS — C786 Secondary malignant neoplasm of retroperitoneum and peritoneum: Secondary | ICD-10-CM

## 2016-04-28 DIAGNOSIS — C787 Secondary malignant neoplasm of liver and intrahepatic bile duct: Secondary | ICD-10-CM

## 2016-04-28 DIAGNOSIS — K5900 Constipation, unspecified: Secondary | ICD-10-CM

## 2016-04-28 DIAGNOSIS — Z5112 Encounter for antineoplastic immunotherapy: Secondary | ICD-10-CM | POA: Diagnosis not present

## 2016-04-28 DIAGNOSIS — L299 Pruritus, unspecified: Secondary | ICD-10-CM

## 2016-04-28 DIAGNOSIS — M549 Dorsalgia, unspecified: Secondary | ICD-10-CM

## 2016-04-28 DIAGNOSIS — D509 Iron deficiency anemia, unspecified: Secondary | ICD-10-CM

## 2016-04-28 LAB — CBC WITH DIFFERENTIAL/PLATELET
BASO%: 0.2 % (ref 0.0–2.0)
BASOS ABS: 0 10*3/uL (ref 0.0–0.1)
EOS ABS: 0.6 10*3/uL — AB (ref 0.0–0.5)
EOS%: 8.3 % — AB (ref 0.0–7.0)
HEMATOCRIT: 35.8 % (ref 34.8–46.6)
HEMOGLOBIN: 11 g/dL — AB (ref 11.6–15.9)
LYMPH#: 1.2 10*3/uL (ref 0.9–3.3)
LYMPH%: 17.4 % (ref 14.0–49.7)
MCH: 21.6 pg — AB (ref 25.1–34.0)
MCHC: 30.7 g/dL — AB (ref 31.5–36.0)
MCV: 70.5 fL — AB (ref 79.5–101.0)
MONO#: 0.5 10*3/uL (ref 0.1–0.9)
MONO%: 7.5 % (ref 0.0–14.0)
NEUT#: 4.7 10*3/uL (ref 1.5–6.5)
NEUT%: 66.6 % (ref 38.4–76.8)
PLATELETS: 471 10*3/uL — AB (ref 145–400)
RBC: 5.09 10*6/uL (ref 3.70–5.45)
RDW: 24.5 % — AB (ref 11.2–14.5)
WBC: 7 10*3/uL (ref 3.9–10.3)

## 2016-04-28 LAB — COMPREHENSIVE METABOLIC PANEL
ALBUMIN: 2.7 g/dL — AB (ref 3.5–5.0)
ALK PHOS: 109 U/L (ref 40–150)
ALT: 10 U/L (ref 0–55)
ANION GAP: 6 meq/L (ref 3–11)
AST: 17 U/L (ref 5–34)
BUN: 9.8 mg/dL (ref 7.0–26.0)
CALCIUM: 9 mg/dL (ref 8.4–10.4)
CO2: 32 mEq/L — ABNORMAL HIGH (ref 22–29)
Chloride: 97 mEq/L — ABNORMAL LOW (ref 98–109)
Creatinine: 0.5 mg/dL — ABNORMAL LOW (ref 0.6–1.1)
EGFR: >90 mL/min/{1.73_m2} (ref >90)
Glucose: 82 mg/dl (ref 70–140)
POTASSIUM: 3.8 meq/L (ref 3.5–5.1)
Sodium: 136 mEq/L (ref 136–145)
Total Bilirubin: <0.3 mg/dL (ref 0.20–1.20)
Total Protein: 6.4 g/dL (ref 6.4–8.3)

## 2016-04-28 LAB — MAGNESIUM: MAGNESIUM: 2 mg/dL (ref 1.5–2.5)

## 2016-04-28 MED ORDER — SODIUM CHLORIDE 0.9 % IV SOLN
10.0000 mg | Freq: Once | INTRAVENOUS | Status: AC
  Administered 2016-04-28: 10 mg via INTRAVENOUS
  Filled 2016-04-28: qty 1

## 2016-04-28 MED ORDER — IRINOTECAN HCL CHEMO INJECTION 100 MG/5ML
180.0000 mg/m2 | Freq: Once | INTRAVENOUS | Status: AC
  Administered 2016-04-28: 280 mg via INTRAVENOUS
  Filled 2016-04-28: qty 4

## 2016-04-28 MED ORDER — SODIUM CHLORIDE 0.9 % IV SOLN
2400.0000 mg/m2 | INTRAVENOUS | Status: DC
  Administered 2016-04-28: 3850 mg via INTRAVENOUS
  Filled 2016-04-28: qty 77

## 2016-04-28 MED ORDER — ATROPINE SULFATE 1 MG/ML IJ SOLN
0.5000 mg | Freq: Once | INTRAMUSCULAR | Status: AC | PRN
  Administered 2016-04-28: 0.5 mg via INTRAVENOUS

## 2016-04-28 MED ORDER — LEUCOVORIN CALCIUM INJECTION 350 MG
400.0000 mg/m2 | Freq: Once | INTRAVENOUS | Status: AC
  Administered 2016-04-28: 644 mg via INTRAVENOUS
  Filled 2016-04-28: qty 32.2

## 2016-04-28 MED ORDER — PALONOSETRON HCL INJECTION 0.25 MG/5ML
0.2500 mg | Freq: Once | INTRAVENOUS | Status: AC
  Administered 2016-04-28: 0.25 mg via INTRAVENOUS

## 2016-04-28 MED ORDER — ATROPINE SULFATE 1 MG/ML IJ SOLN
INTRAMUSCULAR | Status: AC
  Filled 2016-04-28: qty 1

## 2016-04-28 MED ORDER — PALONOSETRON HCL INJECTION 0.25 MG/5ML
INTRAVENOUS | Status: AC
  Filled 2016-04-28: qty 5

## 2016-04-28 MED ORDER — SODIUM CHLORIDE 0.9 % IJ SOLN
10.0000 mL | INTRAMUSCULAR | Status: DC | PRN
  Administered 2016-04-28: 10 mL
  Filled 2016-04-28: qty 10

## 2016-04-28 MED ORDER — SODIUM CHLORIDE 0.9 % IV SOLN
Freq: Once | INTRAVENOUS | Status: AC
  Administered 2016-04-28: 12:00:00 via INTRAVENOUS

## 2016-04-28 MED ORDER — FLUOROURACIL CHEMO INJECTION 2.5 GM/50ML
400.0000 mg/m2 | Freq: Once | INTRAVENOUS | Status: AC
  Administered 2016-04-28: 650 mg via INTRAVENOUS
  Filled 2016-04-28: qty 13

## 2016-04-28 MED ORDER — SODIUM CHLORIDE 0.9 % IV SOLN
Freq: Once | INTRAVENOUS | Status: AC
  Administered 2016-04-28: 13:00:00 via INTRAVENOUS

## 2016-04-28 MED ORDER — SODIUM CHLORIDE 0.9 % IV SOLN
5.4000 mg/kg | Freq: Once | INTRAVENOUS | Status: AC
  Administered 2016-04-28: 300 mg via INTRAVENOUS
  Filled 2016-04-28: qty 15

## 2016-04-28 NOTE — Telephone Encounter (Signed)
Per staff message and POF I have scheduled appts. Advised scheduler of appts. JMW  

## 2016-04-28 NOTE — Telephone Encounter (Signed)
Pt will get updated sched in tx room °

## 2016-04-28 NOTE — Progress Notes (Signed)
  [place] OFFICE PROGRESS NOTE   Diagnosis: Colon cancer  INTERVAL HISTORY:   Carolyn Barton returns as scheduled. She completed cycle 1 FOLFIRI/panitumumab on 04/14/2016. No nausea/vomiting or diarrhea. She reports constipation. She eats 3 meals per day, but only takes in small amounts. She has developed a pruritic rash at the Port-A-Cath site. This has been present since she completed treatment. The abdominal wound is healing. She is getting out of the house. Minimal skin rash at the face.  Objective:  Vital signs in last 24 hours:  Blood pressure (!) 141/87, pulse 100, temperature 98.4 F (36.9 C), temperature source Oral, resp. rate 18, weight 114 lb 14.4 oz (52.1 kg), SpO2 100 %.    HEENT: No thrush or ulcers Resp: Lungs clear bilaterally Cardio: Regular rate and rhythm GI: No hepatomegaly, left lower quadrant colostomy with Brown stool, midline wound with a superficial opening-no surrounding erythema Vascular: No leg edema  Skin: Erythematous rash and a tape distribution surrounding the Port-A-Cath. A few acne-type lesions at the lower face and neck. Cutaneous thickening at the left lateral abdomen without erythema.   Portacath/PICC-without erythema  Lab Results:  Lab Results  Component Value Date   WBC 7.0 04/28/2016   HGB 11.0 (L) 04/28/2016   HCT 35.8 04/28/2016   MCV 70.5 (L) 04/28/2016   PLT 471 (H) 04/28/2016   NEUTROABS 4.7 04/28/2016     Lab Results  Component Value Date   CEA1 7.6 (H) 04/14/2016     Medications: I have reviewed the patient's current medications.  Assessment/Plan: 1. Metastatic colorectal cancer-status post an exploratory laparotomy 03/03/2016 revealing a proximal rectal mass, peritoneal carcinomatosis, and liver metastases  Biopsy of peritoneal nodules 03/03/2016 confirmed metastatic adenocarcinoma consistent with a colon primary  Foundation 1 testing-MSI-stable, tumor mutation burden-low, no BRAF NRAS or KRAS  mutation  Cycle 1 FOLFIRI/PANITUMUMAB 04/14/2016  Cycle 2 FOLFIRI/panitumumab 04/28/2016  2.History of a Bowel obstruction secondary to #1  3. Chronic back pain  4. Essential thrombocytosis  5. Early obstruction of the left ureter noted at the time of surgery 03/03/2016-Dr. Tannenbaum placed a left double-J stent 03/18/2016  6. Abdominal wall cellulitis 03/10/2016, blood cultures positive for coagulase negative staphylococcus-methicillin-resistant, status post treatment with vancomycin and Bactrim  7. Tachycardia-persistent following discharge from the hospital, likely related to anemia and deconditioning  CT chest 04/04/2016-negative for pulmonary embolism.  8.  Port-A-Cath placement 04/10/2016, interventional radiology  9.  Iron deficiency anemia. Feraheme 11/05/2015 , 11/12/2015, and 04/14/2016-improved    Disposition:  She tolerated the first cycle of FOLFIRI/panitumumab well. The rash at the Port-A-Cath site is likely related to a tape reaction. The Port-A-Cath dressing was changed to a different brand today. She will use hydrocortisone cream on the area of erythema if she has persistent pruritus.  Ms. [place]side will try eating frequent small meals. She will use MiraLAX as needed for constipation.  Ms. Don will return for an office visit and chemotherapy in 2 weeks.  The abdominal wound continues to heal.  [name] Coder, MD  04/28/2016  12:39 PM

## 2016-04-28 NOTE — Patient Instructions (Signed)

## 2016-04-30 ENCOUNTER — Ambulatory Visit (HOSPITAL_BASED_OUTPATIENT_CLINIC_OR_DEPARTMENT_OTHER): Payer: BLUE CROSS/BLUE SHIELD

## 2016-04-30 VITALS — BP 129/87 | HR 77 | Temp 98.9°F | Resp 18

## 2016-04-30 DIAGNOSIS — C786 Secondary malignant neoplasm of retroperitoneum and peritoneum: Secondary | ICD-10-CM

## 2016-04-30 DIAGNOSIS — C787 Secondary malignant neoplasm of liver and intrahepatic bile duct: Secondary | ICD-10-CM

## 2016-04-30 DIAGNOSIS — C20 Malignant neoplasm of rectum: Secondary | ICD-10-CM

## 2016-04-30 MED ORDER — HEPARIN SOD (PORK) LOCK FLUSH 100 UNIT/ML IV SOLN
500.0000 [IU] | Freq: Once | INTRAVENOUS | Status: AC | PRN
  Administered 2016-04-30: 500 [IU]
  Filled 2016-04-30: qty 5

## 2016-04-30 MED ORDER — SODIUM CHLORIDE 0.9% FLUSH
10.0000 mL | INTRAVENOUS | Status: DC | PRN
  Administered 2016-04-30: 10 mL
  Filled 2016-04-30: qty 10

## 2016-04-30 NOTE — Progress Notes (Signed)
Pt reported to flush room to have port/pump disconnected. Pt reports area under dressing itching. It started yesterday. Pt states this happened last time and they used povidone-iodine to cleanse the area prior to port access. Pt has op site dressing intact. No area compromised.   Pump disconnected and port de accessed. Pt reports increase in itching. Area is reddened. Pt states she was advised to use hydrocortisone cream last time and she still has some at home. Pt advised to continue using. Samples of Aquaphor given to pt to try additionally. Pt asked to have some applied prior to leaving today. Pt states immediate relief upon application. Pt advised to call this office tomorrow if no improvement to see our [facility]. Pt agrees to call.

## 2016-05-01 ENCOUNTER — Telehealth: Payer: Self-pay | Admitting: Internal Medicine

## 2016-05-01 NOTE — Telephone Encounter (Signed)
Nurse Corbin Ade from [facility] calling regarding pt having a HR of 117 beats/minute. HR has been as high as 100 beats/minute before. Pt has no other symptoms. Pt's  lungs are clear BP 122/74. Pt feels fine. Pt is on chenotherapy , she had a infusion treatment.yesterday. Nurse is aware to call the office if has any symptoms. Nurse verbalized understanding.

## 2016-05-02 ENCOUNTER — Telehealth: Payer: Self-pay | Admitting: *Deleted

## 2016-05-02 NOTE — Telephone Encounter (Signed)
Message from pt reporting back pain that is "totally different" from her usual back pain. Last BM 04/29/16. Taking Miralax daily. Returned call to pt, she takes Methadone TID and Dilaudid 4 mg 2-3x daily for chronic back pain.  She reports this pain is different, hurts mid-back.  Pt denies leg pain/ numbness or difficulty ambulating. Instructed her to add Senokot-S BID for constipation. She will try this to see if this helps her pain and will continue current pain regimen. Dr. Benay Spice made aware of call, in agreement with above.

## 2016-05-11 ENCOUNTER — Other Ambulatory Visit: Payer: Self-pay | Admitting: Oncology

## 2016-05-12 ENCOUNTER — Ambulatory Visit: Payer: BLUE CROSS/BLUE SHIELD | Admitting: Nutrition

## 2016-05-12 ENCOUNTER — Telehealth: Payer: Self-pay | Admitting: Oncology

## 2016-05-12 ENCOUNTER — Ambulatory Visit (HOSPITAL_BASED_OUTPATIENT_CLINIC_OR_DEPARTMENT_OTHER): Payer: BLUE CROSS/BLUE SHIELD | Admitting: Oncology

## 2016-05-12 ENCOUNTER — Ambulatory Visit (HOSPITAL_BASED_OUTPATIENT_CLINIC_OR_DEPARTMENT_OTHER): Payer: BLUE CROSS/BLUE SHIELD

## 2016-05-12 ENCOUNTER — Other Ambulatory Visit (HOSPITAL_BASED_OUTPATIENT_CLINIC_OR_DEPARTMENT_OTHER): Payer: BLUE CROSS/BLUE SHIELD

## 2016-05-12 ENCOUNTER — Ambulatory Visit: Payer: BLUE CROSS/BLUE SHIELD

## 2016-05-12 VITALS — BP 146/102 | HR 118 | Temp 97.9°F | Resp 18 | Ht 65.0 in | Wt 113.3 lb

## 2016-05-12 DIAGNOSIS — C19 Malignant neoplasm of rectosigmoid junction: Secondary | ICD-10-CM

## 2016-05-12 DIAGNOSIS — C786 Secondary malignant neoplasm of retroperitoneum and peritoneum: Secondary | ICD-10-CM

## 2016-05-12 DIAGNOSIS — C20 Malignant neoplasm of rectum: Secondary | ICD-10-CM

## 2016-05-12 DIAGNOSIS — Z5112 Encounter for antineoplastic immunotherapy: Secondary | ICD-10-CM

## 2016-05-12 DIAGNOSIS — M898X9 Other specified disorders of bone, unspecified site: Secondary | ICD-10-CM

## 2016-05-12 DIAGNOSIS — D509 Iron deficiency anemia, unspecified: Secondary | ICD-10-CM

## 2016-05-12 DIAGNOSIS — C787 Secondary malignant neoplasm of liver and intrahepatic bile duct: Secondary | ICD-10-CM

## 2016-05-12 DIAGNOSIS — D473 Essential (hemorrhagic) thrombocythemia: Secondary | ICD-10-CM

## 2016-05-12 DIAGNOSIS — Z5111 Encounter for antineoplastic chemotherapy: Secondary | ICD-10-CM | POA: Diagnosis not present

## 2016-05-12 DIAGNOSIS — R21 Rash and other nonspecific skin eruption: Secondary | ICD-10-CM

## 2016-05-12 DIAGNOSIS — M549 Dorsalgia, unspecified: Secondary | ICD-10-CM

## 2016-05-12 LAB — CBC WITH DIFFERENTIAL/PLATELET
BASO%: 1.1 % (ref 0.0–2.0)
BASOS ABS: 0.1 10*3/uL (ref 0.0–0.1)
EOS ABS: 0.5 10*3/uL (ref 0.0–0.5)
EOS%: 5.3 % (ref 0.0–7.0)
HEMATOCRIT: 37 % (ref 34.8–46.6)
HEMOGLOBIN: 11.4 g/dL — AB (ref 11.6–15.9)
LYMPH#: 1.4 10*3/uL (ref 0.9–3.3)
LYMPH%: 14.5 % (ref 14.0–49.7)
MCH: 22.6 pg — AB (ref 25.1–34.0)
MCHC: 30.9 g/dL — ABNORMAL LOW (ref 31.5–36.0)
MCV: 73.2 fL — AB (ref 79.5–101.0)
MONO#: 1 10*3/uL — ABNORMAL HIGH (ref 0.1–0.9)
MONO%: 9.9 % (ref 0.0–14.0)
NEUT#: 6.7 10*3/uL — ABNORMAL HIGH (ref 1.5–6.5)
NEUT%: 69.2 % (ref 38.4–76.8)
PLATELETS: 448 10*3/uL — AB (ref 145–400)
RBC: 5.06 10*6/uL (ref 3.70–5.45)
RDW: 26.9 % — AB (ref 11.2–14.5)
WBC: 9.7 10*3/uL (ref 3.9–10.3)

## 2016-05-12 LAB — COMPREHENSIVE METABOLIC PANEL
ALBUMIN: 2.5 g/dL — AB (ref 3.5–5.0)
ALK PHOS: 120 U/L (ref 40–150)
ALT: 12 U/L (ref 0–55)
ANION GAP: 6 meq/L (ref 3–11)
AST: 13 U/L (ref 5–34)
BUN: 8.2 mg/dL (ref 7.0–26.0)
CALCIUM: 9.3 mg/dL (ref 8.4–10.4)
CO2: 30 mEq/L — ABNORMAL HIGH (ref 22–29)
Chloride: 98 mEq/L (ref 98–109)
Creatinine: 0.5 mg/dL — ABNORMAL LOW (ref 0.6–1.1)
Glucose: 101 mg/dl (ref 70–140)
POTASSIUM: 3.8 meq/L (ref 3.5–5.1)
Sodium: 134 mEq/L — ABNORMAL LOW (ref 136–145)
Total Bilirubin: <0.3 mg/dL (ref 0.20–1.20)
Total Protein: 6.2 g/dL — ABNORMAL LOW (ref 6.4–8.3)

## 2016-05-12 LAB — MAGNESIUM: MAGNESIUM: 1.8 mg/dL (ref 1.5–2.5)

## 2016-05-12 MED ORDER — SODIUM CHLORIDE 0.9 % IV SOLN
Freq: Once | INTRAVENOUS | Status: AC
  Administered 2016-05-12: 10:00:00 via INTRAVENOUS

## 2016-05-12 MED ORDER — PALONOSETRON HCL INJECTION 0.25 MG/5ML
0.2500 mg | Freq: Once | INTRAVENOUS | Status: AC
Start: 2016-05-12 — End: 2016-05-12
  Administered 2016-05-12: 0.25 mg via INTRAVENOUS

## 2016-05-12 MED ORDER — ATROPINE SULFATE 1 MG/ML IJ SOLN
INTRAMUSCULAR | Status: AC
  Filled 2016-05-12: qty 1

## 2016-05-12 MED ORDER — IRINOTECAN HCL CHEMO INJECTION 100 MG/5ML
180.0000 mg/m2 | Freq: Once | INTRAVENOUS | Status: AC
  Administered 2016-05-12: 280 mg via INTRAVENOUS
  Filled 2016-05-12: qty 10

## 2016-05-12 MED ORDER — LEUCOVORIN CALCIUM INJECTION 350 MG
400.0000 mg/m2 | Freq: Once | INTRAVENOUS | Status: AC
  Administered 2016-05-12: 644 mg via INTRAVENOUS
  Filled 2016-05-12: qty 32.2

## 2016-05-12 MED ORDER — SODIUM CHLORIDE 0.9 % IV SOLN
5.4000 mg/kg | Freq: Once | INTRAVENOUS | Status: AC
  Administered 2016-05-12: 300 mg via INTRAVENOUS
  Filled 2016-05-12: qty 5

## 2016-05-12 MED ORDER — SODIUM CHLORIDE 0.9 % IJ SOLN
10.0000 mL | INTRAMUSCULAR | Status: DC | PRN
  Administered 2016-05-12: 10 mL
  Filled 2016-05-12: qty 10

## 2016-05-12 MED ORDER — FLUOROURACIL CHEMO INJECTION 2.5 GM/50ML
400.0000 mg/m2 | Freq: Once | INTRAVENOUS | Status: AC
  Administered 2016-05-12: 650 mg via INTRAVENOUS
  Filled 2016-05-12: qty 13

## 2016-05-12 MED ORDER — SODIUM CHLORIDE 0.9 % IV SOLN
2400.0000 mg/m2 | INTRAVENOUS | Status: DC
  Administered 2016-05-12: 3850 mg via INTRAVENOUS
  Filled 2016-05-12: qty 77

## 2016-05-12 MED ORDER — ATROPINE SULFATE 1 MG/ML IJ SOLN
0.5000 mg | Freq: Once | INTRAMUSCULAR | Status: AC | PRN
Start: 2016-05-12 — End: 2016-05-12
  Administered 2016-05-12: 0.5 mg via INTRAVENOUS

## 2016-05-12 MED ORDER — SODIUM CHLORIDE 0.9 % IV SOLN
10.0000 mg | Freq: Once | INTRAVENOUS | Status: AC
  Administered 2016-05-12: 10 mg via INTRAVENOUS
  Filled 2016-05-12: qty 1

## 2016-05-12 MED ORDER — PALONOSETRON HCL INJECTION 0.25 MG/5ML
INTRAVENOUS | Status: AC
  Filled 2016-05-12: qty 5

## 2016-05-12 NOTE — Telephone Encounter (Signed)
Gave patient avs report and appointments for August and September  °

## 2016-05-12 NOTE — Progress Notes (Signed)
Nutrition follow-up completed with patient in the chemotherapy room being treated for metastatic colon cancer. Patient's weight has decreased and was documented as 113.3 pounds on August 14 decreased from 120.7 pounds July 17. Patient reports she has tried to increase her oral intake to 6 times daily. She is focusing on protein containing foods. Patient has not tried [employer] essentials or protein powder but agrees to after treatment today. Reports constipation.  However, she is working on this.  Nutrition diagnosis:  Unintentional weight loss continues. Severe malnutrition continues.  Intervention:  Educated patient again on importance of increased calories and protein to minimize further weight loss. Recommended 1-2 [employer] breakfast essentials daily. Reviewed importance of adequate protein and calorie intake. Questions were answered.  Teach back method used.  Monitoring, evaluation, goals:  Patient will work to increase calories and protein to minimize further weight loss.  Next visit: Tuesday, August 29, during infusion.  **Disclaimer: This note was dictated with voice recognition software. Similar sounding words can inadvertently be transcribed and this note may contain transcription errors which may not have been corrected upon publication of note.**

## 2016-05-12 NOTE — Progress Notes (Signed)
  [place] OFFICE PROGRESS NOTE   Diagnosis: Colon cancer  INTERVAL HISTORY:   Carolyn Barton returns as scheduled. She completed another cycle of FOLFIRI/panitumumab on 04/28/2016. No nausea/vomiting, mouth sores, or diarrhea following chemotherapy. She has a mild rash over the face. She complains of new onset pain in the left posterior iliac area. The pain is relieved with hydromorphone. She continues methadone for chronic back pain. The abdominal wound is healing.  Objective:  Vital signs in last 24 hours:  Blood pressure (!) 146/102, pulse (!) 118, temperature 97.9 F (36.6 C), temperature source Oral, resp. rate 18, height '5\' 5"'$  (1.651 m), weight 113 lb 4.8 oz (51.4 kg), SpO2 100 %.    HEENT: No thrush or ulcers Resp: Lungs clear bilaterally Cardio: Regular rate and rhythm tachycardia GI: No hepatosplenomegaly, left lower quadrant colostomy, no mass, healing midline incision Vascular: No leg edema Musculoskeletal: The area of pain localizes to the left upper posterior iliac region. No mass or rash. No tenderness.  Skin: Mild acne type rash at the lower face   Portacath/PICC-without erythema  Lab Results:  Lab Results  Component Value Date   WBC 9.7 05/12/2016   HGB 11.4 (L) 05/12/2016   HCT 37.0 05/12/2016   MCV 73.2 (L) 05/12/2016   PLT 448 (H) 05/12/2016   NEUTROABS 6.7 (H) 05/12/2016      Lab Results  Component Value Date   CEA1 7.6 (H) 04/14/2016    Imaging:  No results found.  Medications: I have reviewed the patient's current medications.  Assessment/Plan: 1. Metastatic colorectal cancer-status post an exploratory laparotomy 03/03/2016 revealing a proximal rectal mass, peritoneal carcinomatosis, and liver metastases  Biopsy of peritoneal nodules 03/03/2016 confirmed metastatic adenocarcinoma consistent with a colon primary  Foundation 1 testing-MSI-stable, tumor mutation burden-low, no BRAF NRAS or KRAS mutation  Cycle 1  FOLFIRI/PANITUMUMAB 04/14/2016  Cycle 2 FOLFIRI/panitumumab 04/28/2016  Cycle 3 FOLFIRI/panitumumab 05/12/2016  2.History of a Bowel obstruction secondary to #1  3. Chronic back pain  4. Essential thrombocytosis  5. Early obstruction of the left ureter noted at the time of surgery 03/03/2016-Dr. Tannenbaum placed a left double-J stent 03/18/2016  6. Abdominal wall cellulitis 03/10/2016, blood cultures positive for coagulase negative staphylococcus-methicillin-resistant, status post treatment with vancomycin and Bactrim  7. Tachycardia-persistent following discharge from the hospital, likely related to anemia and deconditioning  CT chest 04/04/2016-negative for pulmonary embolism.  8. Port-A-Cath placement 04/10/2016, interventional radiology  9. Iron deficiency anemia. Feraheme 11/05/2015 , 11/12/2015, and 04/14/2016-improved    Disposition:  Carolyn Barton appears to be tolerating the chemotherapy well. The plan is to proceed with cycle 3 FOLFIRI/panitumumab today. The etiology of the left posterior iliac pain is unclear. We will image the abdomen/pelvis if the pain persists.  The pain may be related to the primary colon tumor.  Carolyn Barton will return for an office visit and chemotherapy in 2 weeks. The plan is to schedule a restaging CT evaluation after cycle 5.  [name] Coder, MD  05/12/2016  9:28 AM

## 2016-05-14 ENCOUNTER — Ambulatory Visit (HOSPITAL_BASED_OUTPATIENT_CLINIC_OR_DEPARTMENT_OTHER): Payer: BLUE CROSS/BLUE SHIELD

## 2016-05-14 VITALS — BP 145/91 | HR 106 | Temp 97.1°F | Resp 20

## 2016-05-14 DIAGNOSIS — C786 Secondary malignant neoplasm of retroperitoneum and peritoneum: Secondary | ICD-10-CM

## 2016-05-14 DIAGNOSIS — C20 Malignant neoplasm of rectum: Secondary | ICD-10-CM

## 2016-05-14 DIAGNOSIS — C787 Secondary malignant neoplasm of liver and intrahepatic bile duct: Secondary | ICD-10-CM

## 2016-05-14 MED ORDER — SODIUM CHLORIDE 0.9% FLUSH
10.0000 mL | INTRAVENOUS | Status: DC | PRN
  Administered 2016-05-14: 10 mL
  Filled 2016-05-14: qty 10

## 2016-05-14 MED ORDER — HEPARIN SOD (PORK) LOCK FLUSH 100 UNIT/ML IV SOLN
500.0000 [IU] | Freq: Once | INTRAVENOUS | Status: AC | PRN
  Administered 2016-05-14: 500 [IU]
  Filled 2016-05-14: qty 5

## 2016-05-25 ENCOUNTER — Other Ambulatory Visit: Payer: Self-pay | Admitting: Oncology

## 2016-05-27 ENCOUNTER — Ambulatory Visit (HOSPITAL_BASED_OUTPATIENT_CLINIC_OR_DEPARTMENT_OTHER): Payer: BLUE CROSS/BLUE SHIELD

## 2016-05-27 ENCOUNTER — Ambulatory Visit: Payer: BLUE CROSS/BLUE SHIELD

## 2016-05-27 ENCOUNTER — Other Ambulatory Visit (HOSPITAL_BASED_OUTPATIENT_CLINIC_OR_DEPARTMENT_OTHER): Payer: BLUE CROSS/BLUE SHIELD

## 2016-05-27 ENCOUNTER — Telehealth: Payer: Self-pay | Admitting: Oncology

## 2016-05-27 ENCOUNTER — Other Ambulatory Visit: Payer: Self-pay | Admitting: *Deleted

## 2016-05-27 ENCOUNTER — Ambulatory Visit (HOSPITAL_BASED_OUTPATIENT_CLINIC_OR_DEPARTMENT_OTHER): Payer: BLUE CROSS/BLUE SHIELD | Admitting: Nurse Practitioner

## 2016-05-27 ENCOUNTER — Ambulatory Visit: Payer: BLUE CROSS/BLUE SHIELD | Admitting: Nutrition

## 2016-05-27 VITALS — BP 136/93 | HR 113 | Temp 98.2°F | Resp 18 | Ht 65.0 in | Wt 111.4 lb

## 2016-05-27 DIAGNOSIS — M549 Dorsalgia, unspecified: Secondary | ICD-10-CM

## 2016-05-27 DIAGNOSIS — Z5111 Encounter for antineoplastic chemotherapy: Secondary | ICD-10-CM | POA: Diagnosis not present

## 2016-05-27 DIAGNOSIS — C787 Secondary malignant neoplasm of liver and intrahepatic bile duct: Secondary | ICD-10-CM

## 2016-05-27 DIAGNOSIS — Z5112 Encounter for antineoplastic immunotherapy: Secondary | ICD-10-CM | POA: Diagnosis not present

## 2016-05-27 DIAGNOSIS — C20 Malignant neoplasm of rectum: Secondary | ICD-10-CM | POA: Diagnosis not present

## 2016-05-27 DIAGNOSIS — M898X8 Other specified disorders of bone, other site: Secondary | ICD-10-CM

## 2016-05-27 DIAGNOSIS — D473 Essential (hemorrhagic) thrombocythemia: Secondary | ICD-10-CM

## 2016-05-27 DIAGNOSIS — C786 Secondary malignant neoplasm of retroperitoneum and peritoneum: Secondary | ICD-10-CM | POA: Diagnosis not present

## 2016-05-27 DIAGNOSIS — C19 Malignant neoplasm of rectosigmoid junction: Secondary | ICD-10-CM

## 2016-05-27 DIAGNOSIS — D509 Iron deficiency anemia, unspecified: Secondary | ICD-10-CM

## 2016-05-27 LAB — CBC WITH DIFFERENTIAL/PLATELET
BASO%: 0.7 % (ref 0.0–2.0)
BASOS ABS: 0.1 10*3/uL (ref 0.0–0.1)
EOS ABS: 0.6 10*3/uL — AB (ref 0.0–0.5)
EOS%: 6.3 % (ref 0.0–7.0)
HCT: 32.9 % — ABNORMAL LOW (ref 34.8–46.6)
HEMOGLOBIN: 10.3 g/dL — AB (ref 11.6–15.9)
LYMPH%: 14.5 % (ref 14.0–49.7)
MCH: 23.4 pg — AB (ref 25.1–34.0)
MCHC: 31.2 g/dL — AB (ref 31.5–36.0)
MCV: 75 fL — AB (ref 79.5–101.0)
MONO#: 0.8 10*3/uL (ref 0.1–0.9)
MONO%: 8.6 % (ref 0.0–14.0)
NEUT#: 6.6 10*3/uL — ABNORMAL HIGH (ref 1.5–6.5)
NEUT%: 69.9 % (ref 38.4–76.8)
Platelets: 580 10*3/uL — ABNORMAL HIGH (ref 145–400)
RBC: 4.38 10*6/uL (ref 3.70–5.45)
RDW: 27.2 % — AB (ref 11.2–14.5)
WBC: 9.4 10*3/uL (ref 3.9–10.3)
lymph#: 1.4 10*3/uL (ref 0.9–3.3)

## 2016-05-27 LAB — COMPREHENSIVE METABOLIC PANEL
ALBUMIN: 2.2 g/dL — AB (ref 3.5–5.0)
ALK PHOS: 117 U/L (ref 40–150)
ALT: 10 U/L (ref 0–55)
AST: 15 U/L (ref 5–34)
Anion Gap: 7 mEq/L (ref 3–11)
BUN: 6.9 mg/dL — ABNORMAL LOW (ref 7.0–26.0)
CO2: 30 mEq/L — ABNORMAL HIGH (ref 22–29)
Calcium: 9.2 mg/dL (ref 8.4–10.4)
Chloride: 98 mEq/L (ref 98–109)
Creatinine: 0.4 mg/dL — ABNORMAL LOW (ref 0.6–1.1)
GLUCOSE: 86 mg/dL (ref 70–140)
POTASSIUM: 3.3 meq/L — AB (ref 3.5–5.1)
SODIUM: 135 meq/L — AB (ref 136–145)
Total Bilirubin: <0.3 mg/dL (ref 0.20–1.20)
Total Protein: 5.7 g/dL — ABNORMAL LOW (ref 6.4–8.3)

## 2016-05-27 LAB — CEA (IN HOUSE-CHCC): CEA (CHCC-In House): 2.21 ng/mL (ref 0.00–5.00)

## 2016-05-27 LAB — MAGNESIUM: MAGNESIUM: 1.7 mg/dL (ref 1.5–2.5)

## 2016-05-27 MED ORDER — SODIUM CHLORIDE 0.9% FLUSH
10.0000 mL | INTRAVENOUS | Status: DC | PRN
  Filled 2016-05-27: qty 10

## 2016-05-27 MED ORDER — PALONOSETRON HCL INJECTION 0.25 MG/5ML
INTRAVENOUS | Status: AC
  Filled 2016-05-27: qty 5

## 2016-05-27 MED ORDER — ATROPINE SULFATE 1 MG/ML IJ SOLN
0.5000 mg | Freq: Once | INTRAMUSCULAR | Status: AC | PRN
  Administered 2016-05-27: 0.5 mg via INTRAVENOUS

## 2016-05-27 MED ORDER — FLUOROURACIL CHEMO INJECTION 2.5 GM/50ML
400.0000 mg/m2 | Freq: Once | INTRAVENOUS | Status: AC
  Administered 2016-05-27: 650 mg via INTRAVENOUS
  Filled 2016-05-27: qty 13

## 2016-05-27 MED ORDER — SODIUM CHLORIDE 0.9 % IV SOLN
Freq: Once | INTRAVENOUS | Status: AC
  Administered 2016-05-27: 12:00:00 via INTRAVENOUS

## 2016-05-27 MED ORDER — SODIUM CHLORIDE 0.9 % IV SOLN
5.4000 mg/kg | Freq: Once | INTRAVENOUS | Status: AC
  Administered 2016-05-27: 300 mg via INTRAVENOUS
  Filled 2016-05-27: qty 15

## 2016-05-27 MED ORDER — SODIUM CHLORIDE 0.9% FLUSH
10.0000 mL | INTRAVENOUS | Status: DC | PRN
  Administered 2016-05-27: 10 mL via INTRAVENOUS
  Filled 2016-05-27: qty 10

## 2016-05-27 MED ORDER — IRINOTECAN HCL CHEMO INJECTION 100 MG/5ML
180.0000 mg/m2 | Freq: Once | INTRAVENOUS | Status: AC
  Administered 2016-05-27: 280 mg via INTRAVENOUS
  Filled 2016-05-27: qty 10

## 2016-05-27 MED ORDER — ATROPINE SULFATE 1 MG/ML IJ SOLN
INTRAMUSCULAR | Status: AC
  Filled 2016-05-27: qty 1

## 2016-05-27 MED ORDER — HEPARIN SOD (PORK) LOCK FLUSH 100 UNIT/ML IV SOLN
500.0000 [IU] | Freq: Once | INTRAVENOUS | Status: DC | PRN
  Filled 2016-05-27: qty 5

## 2016-05-27 MED ORDER — PALONOSETRON HCL INJECTION 0.25 MG/5ML
0.2500 mg | Freq: Once | INTRAVENOUS | Status: AC
  Administered 2016-05-27: 0.25 mg via INTRAVENOUS

## 2016-05-27 MED ORDER — SODIUM CHLORIDE 0.9 % IV SOLN
2400.0000 mg/m2 | INTRAVENOUS | Status: DC
  Administered 2016-05-27: 3850 mg via INTRAVENOUS
  Filled 2016-05-27: qty 77

## 2016-05-27 MED ORDER — SODIUM CHLORIDE 0.9 % IV SOLN
10.0000 mg | Freq: Once | INTRAVENOUS | Status: AC
  Administered 2016-05-27: 10 mg via INTRAVENOUS
  Filled 2016-05-27: qty 1

## 2016-05-27 MED ORDER — LEUCOVORIN CALCIUM INJECTION 350 MG
400.0000 mg/m2 | Freq: Once | INTRAVENOUS | Status: AC
  Administered 2016-05-27: 644 mg via INTRAVENOUS
  Filled 2016-05-27: qty 32.2

## 2016-05-27 NOTE — Patient Instructions (Signed)
[facility] Discharge Instructions for Patients Receiving Chemotherapy  Today you received the following chemotherapy agents: Camptosar / Leucovorin / Vectibix / Adrucil  To help prevent nausea and vomiting after your treatment, we encourage you to take your nausea medication as directed. If you develop nausea and vomiting that is not controlled by your nausea medication, call the clinic.   BELOW ARE SYMPTOMS THAT SHOULD BE REPORTED IMMEDIATELY:  *FEVER GREATER THAN 100.5 F  *CHILLS WITH OR WITHOUT FEVER  NAUSEA AND VOMITING THAT IS NOT CONTROLLED WITH YOUR NAUSEA MEDICATION  *UNUSUAL SHORTNESS OF BREATH  *UNUSUAL BRUISING OR BLEEDING  TENDERNESS IN MOUTH AND THROAT WITH OR WITHOUT PRESENCE OF ULCERS  *URINARY PROBLEMS  *BOWEL PROBLEMS  UNUSUAL RASH Items with * indicate a potential emergency and should be followed up as soon as possible.  Feel free to call the clinic you have any questions or concerns. The clinic phone number is [phone].  Please show the [facility] at check-in to the Emergency Department and triage nurse.  Return for PUMP DISCONTINUE 05/29/16 at 1400

## 2016-05-27 NOTE — Progress Notes (Signed)
  [place] OFFICE PROGRESS NOTE   Diagnosis:  Colon cancer  INTERVAL HISTORY:   Carolyn Barton returns as scheduled. She completed cycle 3 FOLFIRI/PANITUMUMAB 05/12/2016. She denies nausea/vomiting. No mouth sores. No diarrhea. No significant rash. She reports a good appetite but continues to lose weight. She continues to have pain in the left posterior iliac region. She takes hydromorphone as needed with good relief. The pain does not radiate. No numbness or weakness in her legs. She also continues methadone for chronic back pain. The pain occurs daily or every other day.   Objective:  Vital signs in last 24 hours:  Blood pressure (!) 136/93, pulse (!) 113, temperature 98.2 F (36.8 C), temperature source Oral, resp. rate 18, height '5\' 5"'$  (1.651 m), weight 111 lb 6.4 oz (50.5 kg), SpO2 100 %.    HEENT: No thrush or ulcers. Resp: Lungs clear bilaterally. Cardio: Regular, tachycardic. GI: No hepatomegaly. Vascular: No leg edema. Neuro: Leg strength 5 over 5. Knee DTRs 2+, symmetric. Skin: Acne-like rash over the lower face. Musculoskeletal: Tender over the left lateral posterior iliac region.  Port-A-Cath without erythema.  Lab Results:  Lab Results  Component Value Date   WBC 9.4 05/27/2016   HGB 10.3 (L) 05/27/2016   HCT 32.9 (L) 05/27/2016   MCV 75.0 (L) 05/27/2016   PLT 580 (H) 05/27/2016   NEUTROABS 6.6 (H) 05/27/2016    Imaging:  No results found.  Medications: I have reviewed the patient's current medications.  Assessment/Plan: 1. Metastatic colorectal cancer-status post an exploratory laparotomy 03/03/2016 revealing a proximal rectal mass, peritoneal carcinomatosis, and liver metastases  Biopsy of peritoneal nodules 03/03/2016 confirmed metastatic adenocarcinoma consistent with a colon primary  Foundation 1 testing-MSI-stable, tumor mutation burden-low, no BRAF NRAS or KRAS mutation  Cycle 1 FOLFIRI/PANITUMUMAB 04/14/2016  Cycle 2  FOLFIRI/panitumumab 04/28/2016  Cycle 3 FOLFIRI/panitumumab 05/12/2016  2.History of aBowel obstruction secondary to #1  3. Chronic back pain  4. Essential thrombocytosis  5. Early obstruction of the left ureter noted at the time of surgery 03/03/2016-Dr. Tannenbaum placed a left double-J stent 03/18/2016  6. Abdominal wall cellulitis 03/10/2016, blood cultures positive for coagulase negative staphylococcus-methicillin-resistant, status post treatment with vancomycin and Bactrim  7. Tachycardia-persistent following discharge from the hospital, likely related to anemia and deconditioning  CT chest 04/04/2016-negative for pulmonary embolism.  8. Port-A-Cath placement 04/10/2016, interventional radiology  9. Iron deficiency anemia. Feraheme 11/05/2015 , 11/12/2015,and 04/14/2016-improved  10. Pain/tenderness left posterior iliac region.   [name] appears stable. She has completed 3 cycles of FOLFIRI/PANITUMUMAB. Plan to proceed with cycle 4 today as scheduled.  She continues to have pain at the left posterior iliac region. I discussed this with Dr. Benay Spice. She will continue her current pain medication regimen and will contact the office if the pain worsens or becomes more consistent.  She will return for a follow-up visit and cycle 5 FOLFIRI/PANITUMUMAB in 2 weeks. The plan is for restaging CT scans after cycle 5.  25 minutes were spent face-to-face at today's visit with the majority of that time involved in counseling/coordination of care.    Ned Card ANP/GNP-BC   05/27/2016  10:44 AM

## 2016-05-27 NOTE — Telephone Encounter (Signed)
GAVE PATIENT RELATIVE AVS REPORT AND APPOINTMENTS FOR AUGUST AND September.  °

## 2016-05-27 NOTE — Progress Notes (Signed)
Nutrition follow-up completed with patient during chemotherapy for metastatic colon cancer. Weight decreased and was documented as 111.4 pounds down from 113.3 pounds August 14. Patient reports she is eating well and does not understand why she continues to lose weight. Dietary recall reveals patient is eating often, very small amounts, and usually lower calorie foods. Patient denies nutrition impact symptoms.  Nutrition diagnosis:  Unintentional weight loss continues.   Severe malnutrition continues.  Intervention:  Patient was encouraged to add calories and protein to meals and snacks to increase overall calories intake. Encouraged patient to try oral nutrition supplements. Provided support and encouragement for patient to continue to work on oral intake. Teach back method was used.  Monitoring, evaluation, goals:  Patient will work to increase calories and protein to minimize weight loss.  Next visit: Monday, September 25, during infusion.  **Disclaimer: This note was dictated with voice recognition software. Similar sounding words can inadvertently be transcribed and this note may contain transcription errors which may not have been corrected upon publication of note.**

## 2016-05-27 NOTE — Progress Notes (Signed)
Pt requested OP SITE due to skin irritation. Advised she does not go home with sorbaview dressing.

## 2016-05-28 LAB — CEA: CEA: 2.5 ng/mL (ref 0.0–4.7)

## 2016-05-29 ENCOUNTER — Ambulatory Visit (HOSPITAL_BASED_OUTPATIENT_CLINIC_OR_DEPARTMENT_OTHER): Payer: BLUE CROSS/BLUE SHIELD

## 2016-05-29 VITALS — BP 124/84 | HR 111 | Temp 98.2°F | Resp 18

## 2016-05-29 DIAGNOSIS — C20 Malignant neoplasm of rectum: Secondary | ICD-10-CM

## 2016-05-29 DIAGNOSIS — C786 Secondary malignant neoplasm of retroperitoneum and peritoneum: Secondary | ICD-10-CM | POA: Diagnosis not present

## 2016-05-29 DIAGNOSIS — C787 Secondary malignant neoplasm of liver and intrahepatic bile duct: Secondary | ICD-10-CM

## 2016-05-29 MED ORDER — HEPARIN SOD (PORK) LOCK FLUSH 100 UNIT/ML IV SOLN
500.0000 [IU] | Freq: Once | INTRAVENOUS | Status: AC | PRN
  Administered 2016-05-29: 500 [IU]
  Filled 2016-05-29: qty 5

## 2016-05-29 MED ORDER — SODIUM CHLORIDE 0.9% FLUSH
10.0000 mL | INTRAVENOUS | Status: DC | PRN
  Administered 2016-05-29: 10 mL
  Filled 2016-05-29: qty 10

## 2016-06-02 ENCOUNTER — Other Ambulatory Visit: Payer: Self-pay | Admitting: Oncology

## 2016-06-09 ENCOUNTER — Other Ambulatory Visit (HOSPITAL_BASED_OUTPATIENT_CLINIC_OR_DEPARTMENT_OTHER): Payer: BLUE CROSS/BLUE SHIELD

## 2016-06-09 ENCOUNTER — Ambulatory Visit (HOSPITAL_BASED_OUTPATIENT_CLINIC_OR_DEPARTMENT_OTHER): Payer: BLUE CROSS/BLUE SHIELD

## 2016-06-09 ENCOUNTER — Telehealth: Payer: Self-pay | Admitting: Oncology

## 2016-06-09 ENCOUNTER — Ambulatory Visit (HOSPITAL_BASED_OUTPATIENT_CLINIC_OR_DEPARTMENT_OTHER): Payer: BLUE CROSS/BLUE SHIELD | Admitting: Oncology

## 2016-06-09 ENCOUNTER — Ambulatory Visit: Payer: BLUE CROSS/BLUE SHIELD

## 2016-06-09 ENCOUNTER — Encounter: Payer: Self-pay | Admitting: *Deleted

## 2016-06-09 VITALS — BP 131/99 | HR 111 | Temp 97.9°F | Ht 65.0 in | Wt 107.1 lb

## 2016-06-09 VITALS — BP 127/84 | HR 85 | Temp 97.8°F | Resp 17

## 2016-06-09 DIAGNOSIS — C787 Secondary malignant neoplasm of liver and intrahepatic bile duct: Secondary | ICD-10-CM

## 2016-06-09 DIAGNOSIS — R634 Abnormal weight loss: Secondary | ICD-10-CM | POA: Diagnosis not present

## 2016-06-09 DIAGNOSIS — D473 Essential (hemorrhagic) thrombocythemia: Secondary | ICD-10-CM

## 2016-06-09 DIAGNOSIS — E876 Hypokalemia: Secondary | ICD-10-CM

## 2016-06-09 DIAGNOSIS — C20 Malignant neoplasm of rectum: Secondary | ICD-10-CM

## 2016-06-09 DIAGNOSIS — D509 Iron deficiency anemia, unspecified: Secondary | ICD-10-CM

## 2016-06-09 DIAGNOSIS — M549 Dorsalgia, unspecified: Secondary | ICD-10-CM

## 2016-06-09 DIAGNOSIS — R000 Tachycardia, unspecified: Secondary | ICD-10-CM

## 2016-06-09 DIAGNOSIS — Z95828 Presence of other vascular implants and grafts: Secondary | ICD-10-CM

## 2016-06-09 DIAGNOSIS — C786 Secondary malignant neoplasm of retroperitoneum and peritoneum: Secondary | ICD-10-CM

## 2016-06-09 DIAGNOSIS — Z5111 Encounter for antineoplastic chemotherapy: Secondary | ICD-10-CM

## 2016-06-09 DIAGNOSIS — Z5112 Encounter for antineoplastic immunotherapy: Secondary | ICD-10-CM

## 2016-06-09 DIAGNOSIS — C19 Malignant neoplasm of rectosigmoid junction: Secondary | ICD-10-CM

## 2016-06-09 DIAGNOSIS — G8929 Other chronic pain: Secondary | ICD-10-CM

## 2016-06-09 LAB — COMPREHENSIVE METABOLIC PANEL
ALT: 12 U/L (ref 0–55)
ANION GAP: 8 meq/L (ref 3–11)
AST: 16 U/L (ref 5–34)
Albumin: 2.2 g/dL — ABNORMAL LOW (ref 3.5–5.0)
Alkaline Phosphatase: 127 U/L (ref 40–150)
BUN: 7.7 mg/dL (ref 7.0–26.0)
CO2: 30 meq/L — AB (ref 22–29)
Calcium: 9.3 mg/dL (ref 8.4–10.4)
Chloride: 100 mEq/L (ref 98–109)
Creatinine: 0.5 mg/dL — ABNORMAL LOW (ref 0.6–1.1)
GLUCOSE: 85 mg/dL (ref 70–140)
POTASSIUM: 2.9 meq/L — AB (ref 3.5–5.1)
SODIUM: 138 meq/L (ref 136–145)
TOTAL PROTEIN: 6 g/dL — AB (ref 6.4–8.3)

## 2016-06-09 LAB — CBC WITH DIFFERENTIAL/PLATELET
BASO%: 0.5 % (ref 0.0–2.0)
BASOS ABS: 0.1 10*3/uL (ref 0.0–0.1)
EOS ABS: 0.6 10*3/uL — AB (ref 0.0–0.5)
EOS%: 6.2 % (ref 0.0–7.0)
HCT: 35.1 % (ref 34.8–46.6)
HGB: 11 g/dL — ABNORMAL LOW (ref 11.6–15.9)
LYMPH%: 12.2 % — AB (ref 14.0–49.7)
MCH: 24.4 pg — AB (ref 25.1–34.0)
MCHC: 31.3 g/dL — AB (ref 31.5–36.0)
MCV: 77.8 fL — AB (ref 79.5–101.0)
MONO#: 0.8 10*3/uL (ref 0.1–0.9)
MONO%: 8.1 % (ref 0.0–14.0)
NEUT%: 73 % (ref 38.4–76.8)
NEUTROS ABS: 7.2 10*3/uL — AB (ref 1.5–6.5)
PLATELETS: 523 10*3/uL — AB (ref 145–400)
RBC: 4.51 10*6/uL (ref 3.70–5.45)
RDW: 23.4 % — ABNORMAL HIGH (ref 11.2–14.5)
WBC: 9.9 10*3/uL (ref 3.9–10.3)
lymph#: 1.2 10*3/uL (ref 0.9–3.3)

## 2016-06-09 LAB — MAGNESIUM: Magnesium: 1.6 mg/dl (ref 1.5–2.5)

## 2016-06-09 LAB — CEA (IN HOUSE-CHCC): CEA (CHCC-IN HOUSE): 1.7 ng/mL (ref 0.00–5.00)

## 2016-06-09 MED ORDER — SODIUM CHLORIDE 0.9 % IV SOLN
Freq: Once | INTRAVENOUS | Status: AC
  Administered 2016-06-09: 14:00:00 via INTRAVENOUS

## 2016-06-09 MED ORDER — SODIUM CHLORIDE 0.9 % IV SOLN
Freq: Once | INTRAVENOUS | Status: AC
  Administered 2016-06-09: 12:00:00 via INTRAVENOUS

## 2016-06-09 MED ORDER — IRINOTECAN HCL CHEMO INJECTION 100 MG/5ML
180.0000 mg/m2 | Freq: Once | INTRAVENOUS | Status: AC
  Administered 2016-06-09: 280 mg via INTRAVENOUS
  Filled 2016-06-09: qty 10

## 2016-06-09 MED ORDER — SODIUM CHLORIDE 0.9 % IV SOLN
5.2000 mg/kg | Freq: Once | INTRAVENOUS | Status: AC
  Administered 2016-06-09: 300 mg via INTRAVENOUS
  Filled 2016-06-09: qty 15

## 2016-06-09 MED ORDER — DEXTROSE 5 % IV SOLN
400.0000 mg/m2 | Freq: Once | INTRAVENOUS | Status: AC
  Administered 2016-06-09: 644 mg via INTRAVENOUS
  Filled 2016-06-09: qty 32.2

## 2016-06-09 MED ORDER — FLUOROURACIL CHEMO INJECTION 5 GM/100ML
2400.0000 mg/m2 | INTRAVENOUS | Status: DC
  Administered 2016-06-09: 3850 mg via INTRAVENOUS
  Filled 2016-06-09: qty 77

## 2016-06-09 MED ORDER — ATROPINE SULFATE 1 MG/ML IJ SOLN
0.5000 mg | Freq: Once | INTRAMUSCULAR | Status: AC | PRN
  Administered 2016-06-09: 0.5 mg via INTRAVENOUS

## 2016-06-09 MED ORDER — PALONOSETRON HCL INJECTION 0.25 MG/5ML
INTRAVENOUS | Status: AC
  Filled 2016-06-09: qty 5

## 2016-06-09 MED ORDER — DEXAMETHASONE SODIUM PHOSPHATE 100 MG/10ML IJ SOLN
10.0000 mg | Freq: Once | INTRAMUSCULAR | Status: AC
  Administered 2016-06-09: 10 mg via INTRAVENOUS
  Filled 2016-06-09: qty 1

## 2016-06-09 MED ORDER — SODIUM CHLORIDE 0.9 % IJ SOLN
10.0000 mL | INTRAMUSCULAR | Status: DC | PRN
  Administered 2016-06-09: 10 mL via INTRAVENOUS
  Filled 2016-06-09: qty 10

## 2016-06-09 MED ORDER — PALONOSETRON HCL INJECTION 0.25 MG/5ML
0.2500 mg | Freq: Once | INTRAVENOUS | Status: AC
Start: 2016-06-09 — End: 2016-06-09
  Administered 2016-06-09: 0.25 mg via INTRAVENOUS

## 2016-06-09 MED ORDER — SODIUM CHLORIDE 0.9 % IV SOLN: Freq: Once | INTRAVENOUS | Status: DC

## 2016-06-09 MED ORDER — POTASSIUM CHLORIDE ER 10 MEQ PO CPCR: 10.0000 meq | ORAL_CAPSULE | Freq: Two times a day (BID) | ORAL | 0 refills | Status: DC

## 2016-06-09 MED ORDER — FLUOROURACIL CHEMO INJECTION 2.5 GM/50ML
400.0000 mg/m2 | Freq: Once | INTRAVENOUS | Status: AC
  Administered 2016-06-09: 650 mg via INTRAVENOUS
  Filled 2016-06-09: qty 13

## 2016-06-09 MED ORDER — POTASSIUM CHLORIDE CRYS ER 20 MEQ PO TBCR
20.0000 meq | EXTENDED_RELEASE_TABLET | Freq: Once | ORAL | Status: AC
  Administered 2016-06-09: 20 meq via ORAL
  Filled 2016-06-09: qty 1

## 2016-06-09 MED ORDER — ATROPINE SULFATE 1 MG/ML IJ SOLN
INTRAMUSCULAR | Status: AC
  Filled 2016-06-09: qty 1

## 2016-06-09 NOTE — Progress Notes (Signed)
  [facility] OFFICE PROGRESS NOTE   Diagnosis: Colon cancer  INTERVAL HISTORY:   [name] returns as scheduled. She completed another cycle of FOLFIRI/panitumumab 05/27/2016. She reports a rare episode of diarrhea. She had 2 episodes of emesis since the last cycle of chemotherapy, but does not have consistent nausea. She eats 3 times per day, but does not eat a large amount. The left-sided iliac discomfort has resolved. She has developed linear ulcerations at the fingers.  Objective:  Vital signs in last 24 hours:  Blood pressure (!) 131/99, pulse (!) 111, temperature 97.9 F (36.6 C), temperature source Oral, height '5\' 5"'$  (1.651 m), weight 107 lb 1.6 oz (48.6 kg), SpO2 100 %.    HEENT: No thrush or ulcers Resp: Lungs clear bilaterally Cardio: Regular rate and rhythm, tachycardia GI: No hepatosplenomegaly, left lower quadrant colostomy with formed stool, superficial opening at the mid aspect of the abdominal wound-no evidence of infection Vascular: No leg edema  Skin: No rash over the chest, back, or face. Linear ulcers at multiple fingers and nailbeds of the hands, feet without skin breakdown   Portacath/PICC-without erythema  Lab Results:  Lab Results  Component Value Date   WBC 9.9 06/09/2016   HGB 11.0 (L) 06/09/2016   HCT 35.1 06/09/2016   MCV 77.8 (L) 06/09/2016   PLT 523 (H) 06/09/2016   NEUTROABS 7.2 (H) 06/09/2016   Potassium 2.9, creatinine 0.5, magnesium 1.6, albumin 2.2  Lab Results  Component Value Date   CEA1 2.5 05/27/2016    I have reviewed the patient's current medications.  Assessment/Plan:  1. Metastatic colorectal cancer-status post an exploratory laparotomy 03/03/2016 revealing a proximal rectal mass, peritoneal carcinomatosis, and liver metastases  Biopsy of peritoneal nodules 03/03/2016 confirmed metastatic adenocarcinoma consistent with a colon primary  Foundation 1 testing-MSI-stable, tumor mutation burden-low, no BRAF NRAS  or KRAS mutation  Cycle 1 FOLFIRI/PANITUMUMAB 04/14/2016  Cycle 2 FOLFIRI/panitumumab 04/28/2016  Cycle 3 FOLFIRI/panitumumab 05/12/2016  Cycle 4 FOLFIRI/panitumumab 05/27/2016  Cycle 5 FOLFIRI/panitumumab 06/09/2016  2.History of aBowel obstruction secondary to #1  3. Chronic back pain  4. Essential thrombocytosis  5. Early obstruction of the left ureter noted at the time of surgery 03/03/2016-Dr. Tannenbaum placed a left double-J stent 03/18/2016  6. Abdominal wall cellulitis 03/10/2016, blood cultures positive for coagulase negative staphylococcus-methicillin-resistant, status post treatment with vancomycin and Bactrim  7. Tachycardia-persistent following discharge from the hospital, likely related to anemia and deconditioning  CT chest 04/04/2016-negative for pulmonary embolism.  8. Port-A-Cath placement 04/10/2016, interventional radiology  9. Iron deficiency anemia. Feraheme 11/05/2015 , 11/12/2015,and 04/14/2016-improved  10. Pain/tenderness left posterior iliac region-resolved  11. Hypokalemia-started on potassium replacement 06/09/2016    Disposition:  Ms. Zahradnik will complete cycle 5 FOLFIRI/panitumumab today. She will undergo a restaging CT evaluation after this cycle. She will meet with the [facility] nutritionist today. She continues to lose weight and the albumin is low. She will begin a potassium supplement.  She has persistent tachycardia of unclear etiology.  [name] Coder, MD  06/09/2016  11:37 AM

## 2016-06-09 NOTE — Patient Instructions (Signed)
[facility] Discharge Instructions for Patients Receiving Chemotherapy  Today you received the following chemotherapy agents Vectibix/Irinotecan/Leucovorin.  To help prevent nausea and vomiting after your treatment, we encourage you to take your nausea medication as directed.   If you develop nausea and vomiting that is not controlled by your nausea medication, call the clinic.   BELOW ARE SYMPTOMS THAT SHOULD BE REPORTED IMMEDIATELY:  *FEVER GREATER THAN 100.5 F  *CHILLS WITH OR WITHOUT FEVER  NAUSEA AND VOMITING THAT IS NOT CONTROLLED WITH YOUR NAUSEA MEDICATION  *UNUSUAL SHORTNESS OF BREATH  *UNUSUAL BRUISING OR BLEEDING  TENDERNESS IN MOUTH AND THROAT WITH OR WITHOUT PRESENCE OF ULCERS  *URINARY PROBLEMS  *BOWEL PROBLEMS  UNUSUAL RASH Items with * indicate a potential emergency and should be followed up as soon as possible.  Feel free to call the clinic you have any questions or concerns. The clinic phone number is [phone].  Please show the [facility] at check-in to the Emergency Department and triage nurse.

## 2016-06-09 NOTE — Progress Notes (Signed)
CMET reviewed by Dr. Benay Spice: Potassium 2.9. Order received/ entered for potassium 20 meq PO today in infusion. Rx sent to pharmacy for 10 meq BID. Eulas Post, Infusion RN made aware.

## 2016-06-09 NOTE — Progress Notes (Signed)
Oncology Nurse Navigator Documentation  Oncology Nurse Navigator Flowsheets 06/09/2016  Navigator Location [facility]Med Onc  Navigator Encounter Type Treatment  Telephone -  Abnormal Finding Date -  Confirmed Diagnosis Date -  Surgery Date -  Treatment Initiated Date -  Patient Visit Type MedOnc  Treatment Phase Active Tx--FOLFIRI/Panitumumab  Barriers/Navigation Needs Coordination of Care--CT scan after this cycle chemo  Education -  Interventions Coordination of Care--confirmed her scan is with contrast--obtained contrast and gave to her with instructions that radiology will tell her when to drink each bottle. Explained the rationale for drinking contrast for this scan  Referrals -  Coordination of Care -  Education Method -  Support Groups/Services Other-[facility] [place];GI Support Fil[facility] Berthold has not made contact with [employer] but intends to at some point.  Acuity Level 1  Time Spent with Patient 15  Tells RN she has adjusted pretty well to the ostomy. Her husband measures and cuts the wafer for her. Getting supplies without difficulty. Says her stoma has remained the same size for quite some time now. Tells RN she wants to have her CT done at [facility] so they can use her port.

## 2016-06-09 NOTE — Telephone Encounter (Signed)
Gave patient relative avs report and appointments for September and October. [facility] will not be able to access port for ct - patient would have to have port access here and then go to [place] for ct. Per patient she would rather just have ct at [facility]. Left message for desk nurse re above and asked that ct location be changed to [facility]. Patient/relative aware central radiology will call re scan.

## 2016-06-09 NOTE — Progress Notes (Signed)
Okay to proceed with treatment despite K+ and Mag levels per Dr. Benay Spice.  Pt to receive PO K+ while in infusion and start daily supplements.    Pt received 59mL NS for tachycardia.  Post fluid vital signs showed improvement in HR down to 85.  Full set obtained and remain stable as charted.  Pt without symptoms at time of discharge.

## 2016-06-10 LAB — CEA: CEA1: 2.1 ng/mL (ref 0.0–4.7)

## 2016-06-11 ENCOUNTER — Ambulatory Visit (HOSPITAL_BASED_OUTPATIENT_CLINIC_OR_DEPARTMENT_OTHER): Payer: BLUE CROSS/BLUE SHIELD

## 2016-06-11 VITALS — BP 123/80 | HR 96 | Temp 97.7°F | Resp 18

## 2016-06-11 DIAGNOSIS — C20 Malignant neoplasm of rectum: Secondary | ICD-10-CM | POA: Diagnosis not present

## 2016-06-11 DIAGNOSIS — C786 Secondary malignant neoplasm of retroperitoneum and peritoneum: Secondary | ICD-10-CM | POA: Diagnosis not present

## 2016-06-11 MED ORDER — HEPARIN SOD (PORK) LOCK FLUSH 100 UNIT/ML IV SOLN
500.0000 [IU] | Freq: Once | INTRAVENOUS | Status: AC | PRN
  Administered 2016-06-11: 500 [IU]
  Filled 2016-06-11: qty 5

## 2016-06-11 MED ORDER — SODIUM CHLORIDE 0.9% FLUSH
10.0000 mL | INTRAVENOUS | Status: DC | PRN
  Administered 2016-06-11: 10 mL
  Filled 2016-06-11: qty 10

## 2016-06-12 ENCOUNTER — Other Ambulatory Visit: Payer: Self-pay | Admitting: *Deleted

## 2016-06-12 DIAGNOSIS — I471 Supraventricular tachycardia: Secondary | ICD-10-CM

## 2016-06-13 ENCOUNTER — Telehealth: Payer: Self-pay | Admitting: Oncology

## 2016-06-13 NOTE — Telephone Encounter (Signed)
lvm to inform pt of flush appt 9/22 to access ort prior to ct per los

## 2016-06-17 ENCOUNTER — Ambulatory Visit (HOSPITAL_COMMUNITY)
Admission: RE | Admit: 2016-06-17 | Discharge: 2016-06-17 | Disposition: A | Discharge status: Home / Self Care | Payer: BLUE CROSS/BLUE SHIELD | Source: Ambulatory Visit | Attending: Oncology | Admitting: Oncology

## 2016-06-17 DIAGNOSIS — Z85038 Personal history of other malignant neoplasm of large intestine: Secondary | ICD-10-CM | POA: Diagnosis not present

## 2016-06-17 DIAGNOSIS — I110 Hypertensive heart disease with heart failure: Secondary | ICD-10-CM | POA: Insufficient documentation

## 2016-06-17 DIAGNOSIS — E119 Type 2 diabetes mellitus without complications: Secondary | ICD-10-CM | POA: Insufficient documentation

## 2016-06-17 DIAGNOSIS — I471 Supraventricular tachycardia: Secondary | ICD-10-CM | POA: Insufficient documentation

## 2016-06-17 DIAGNOSIS — I071 Rheumatic tricuspid insufficiency: Secondary | ICD-10-CM | POA: Diagnosis not present

## 2016-06-17 DIAGNOSIS — I509 Heart failure, unspecified: Secondary | ICD-10-CM | POA: Insufficient documentation

## 2016-06-17 HISTORY — PX: TRANSTHORACIC ECHOCARDIOGRAM: SHX275

## 2016-06-17 LAB — ECHOCARDIOGRAM COMPLETE
CHL CUP DOP CALC LVOT VTI: 13.4 cm
CHL CUP MV DEC (S): 148
E/e' ratio: 8
EWDT: 148 ms
FS: 29 % (ref 28–44)
IVS/LV PW RATIO, ED: 1.05
LA ID, A-P, ES: 20 mm
LA vol: 21 mL
LADIAMINDEX: 1.35 cm/m2
LAVOLA4C: 19 mL
LAVOLIN: 14.2 mL/m2
LEFT ATRIUM END SYS DIAM: 20 mm
LV E/e' medial: 8
LVEEAVG: 8
LVELAT: 8.11 cm/s
LVOT SV: 42 mL
LVOT area: 3.14 cm2
LVOT diameter: 20 mm
LVOT peak vel: 85.8 cm/s
MV pk E vel: 64.9 m/s
MVPKAVEL: 85.7 m/s
PW: 9.36 mm — AB (ref 0.6–1.1)
TAPSE: 22.9 mm
TDI e' lateral: 8.11
TDI e' medial: 5.33

## 2016-06-17 NOTE — Progress Notes (Signed)
Echocardiogram 2D Echocardiogram has been performed.  Carolyn Barton 06/17/2016, 9:58 AM

## 2016-06-20 ENCOUNTER — Telehealth: Payer: Self-pay | Admitting: *Deleted

## 2016-06-20 ENCOUNTER — Ambulatory Visit (HOSPITAL_BASED_OUTPATIENT_CLINIC_OR_DEPARTMENT_OTHER): Payer: BLUE CROSS/BLUE SHIELD

## 2016-06-20 ENCOUNTER — Encounter (HOSPITAL_COMMUNITY): Payer: Self-pay

## 2016-06-20 ENCOUNTER — Ambulatory Visit (HOSPITAL_COMMUNITY)
Admission: RE | Admit: 2016-06-20 | Discharge: 2016-06-20 | Disposition: A | Discharge status: Home / Self Care | Payer: BLUE CROSS/BLUE SHIELD | Source: Ambulatory Visit | Attending: Oncology | Admitting: Oncology

## 2016-06-20 DIAGNOSIS — C20 Malignant neoplasm of rectum: Secondary | ICD-10-CM | POA: Diagnosis present

## 2016-06-20 DIAGNOSIS — Z933 Colostomy status: Secondary | ICD-10-CM | POA: Insufficient documentation

## 2016-06-20 DIAGNOSIS — N1330 Unspecified hydronephrosis: Secondary | ICD-10-CM | POA: Diagnosis not present

## 2016-06-20 DIAGNOSIS — Z452 Encounter for adjustment and management of vascular access device: Secondary | ICD-10-CM

## 2016-06-20 DIAGNOSIS — D509 Iron deficiency anemia, unspecified: Secondary | ICD-10-CM

## 2016-06-20 DIAGNOSIS — C787 Secondary malignant neoplasm of liver and intrahepatic bile duct: Secondary | ICD-10-CM | POA: Diagnosis present

## 2016-06-20 DIAGNOSIS — C786 Secondary malignant neoplasm of retroperitoneum and peritoneum: Secondary | ICD-10-CM

## 2016-06-20 DIAGNOSIS — Z95828 Presence of other vascular implants and grafts: Secondary | ICD-10-CM

## 2016-06-20 MED ORDER — IOPAMIDOL (ISOVUE-300) INJECTION 61%
100.0000 mL | Freq: Once | INTRAVENOUS | Status: AC | PRN
  Administered 2016-06-20: 80 mL via INTRAVENOUS

## 2016-06-20 MED ORDER — HEPARIN SOD (PORK) LOCK FLUSH 100 UNIT/ML IV SOLN
500.0000 [IU] | Freq: Once | INTRAVENOUS | Status: DC | PRN
  Filled 2016-06-20: qty 5

## 2016-06-20 MED ORDER — SODIUM CHLORIDE 0.9 % IJ SOLN
10.0000 mL | INTRAMUSCULAR | Status: DC | PRN
  Administered 2016-06-20: 10 mL via INTRAVENOUS
  Filled 2016-06-20: qty 10

## 2016-06-20 NOTE — Progress Notes (Signed)
Patient is going to CT scan

## 2016-06-20 NOTE — Telephone Encounter (Signed)
Per Dr. Benay Spice, patient notified that there is nothing new on CT abd/pelvis, liver lesion appears smaller and that Dr. Benay Spice is encouraged by results.  Patient appreciative of call and has no questions or concerns at this time.

## 2016-06-22 ENCOUNTER — Other Ambulatory Visit: Payer: Self-pay | Admitting: Oncology

## 2016-06-23 ENCOUNTER — Other Ambulatory Visit (HOSPITAL_BASED_OUTPATIENT_CLINIC_OR_DEPARTMENT_OTHER): Payer: BLUE CROSS/BLUE SHIELD

## 2016-06-23 ENCOUNTER — Telehealth: Payer: Self-pay | Admitting: Oncology

## 2016-06-23 ENCOUNTER — Encounter: Payer: Self-pay | Admitting: Oncology

## 2016-06-23 ENCOUNTER — Ambulatory Visit: Payer: BLUE CROSS/BLUE SHIELD

## 2016-06-23 ENCOUNTER — Ambulatory Visit (HOSPITAL_BASED_OUTPATIENT_CLINIC_OR_DEPARTMENT_OTHER): Payer: BLUE CROSS/BLUE SHIELD | Admitting: Oncology

## 2016-06-23 ENCOUNTER — Encounter: Payer: BLUE CROSS/BLUE SHIELD | Admitting: Nutrition

## 2016-06-23 ENCOUNTER — Ambulatory Visit: Payer: BLUE CROSS/BLUE SHIELD | Admitting: Nutrition

## 2016-06-23 ENCOUNTER — Ambulatory Visit (HOSPITAL_BASED_OUTPATIENT_CLINIC_OR_DEPARTMENT_OTHER): Payer: BLUE CROSS/BLUE SHIELD

## 2016-06-23 VITALS — BP 133/89 | HR 98 | Temp 98.2°F | Resp 18 | Ht 65.0 in | Wt 111.5 lb

## 2016-06-23 DIAGNOSIS — D473 Essential (hemorrhagic) thrombocythemia: Secondary | ICD-10-CM

## 2016-06-23 DIAGNOSIS — L98499 Non-pressure chronic ulcer of skin of other sites with unspecified severity: Secondary | ICD-10-CM

## 2016-06-23 DIAGNOSIS — C787 Secondary malignant neoplasm of liver and intrahepatic bile duct: Secondary | ICD-10-CM | POA: Diagnosis not present

## 2016-06-23 DIAGNOSIS — C19 Malignant neoplasm of rectosigmoid junction: Secondary | ICD-10-CM

## 2016-06-23 DIAGNOSIS — C20 Malignant neoplasm of rectum: Secondary | ICD-10-CM

## 2016-06-23 DIAGNOSIS — Z5112 Encounter for antineoplastic immunotherapy: Secondary | ICD-10-CM | POA: Diagnosis not present

## 2016-06-23 DIAGNOSIS — Z5111 Encounter for antineoplastic chemotherapy: Secondary | ICD-10-CM

## 2016-06-23 DIAGNOSIS — C786 Secondary malignant neoplasm of retroperitoneum and peritoneum: Secondary | ICD-10-CM

## 2016-06-23 DIAGNOSIS — E876 Hypokalemia: Secondary | ICD-10-CM

## 2016-06-23 DIAGNOSIS — M549 Dorsalgia, unspecified: Secondary | ICD-10-CM

## 2016-06-23 LAB — CBC WITH DIFFERENTIAL/PLATELET
BASO%: 1.1 % (ref 0.0–2.0)
BASOS ABS: 0.1 10*3/uL (ref 0.0–0.1)
EOS%: 9.3 % — AB (ref 0.0–7.0)
Eosinophils Absolute: 0.5 10*3/uL (ref 0.0–0.5)
HEMATOCRIT: 33.4 % — AB (ref 34.8–46.6)
HEMOGLOBIN: 10.6 g/dL — AB (ref 11.6–15.9)
LYMPH%: 25.4 % (ref 14.0–49.7)
MCH: 25 pg — AB (ref 25.1–34.0)
MCHC: 31.7 g/dL (ref 31.5–36.0)
MCV: 78.8 fL — AB (ref 79.5–101.0)
MONO#: 0.6 10*3/uL (ref 0.1–0.9)
MONO%: 10.2 % (ref 0.0–14.0)
NEUT#: 3.1 10*3/uL (ref 1.5–6.5)
NEUT%: 54 % (ref 38.4–76.8)
Platelets: 510 10*3/uL — ABNORMAL HIGH (ref 145–400)
RBC: 4.24 10*6/uL (ref 3.70–5.45)
RDW: 25 % — ABNORMAL HIGH (ref 11.2–14.5)
WBC: 5.7 10*3/uL (ref 3.9–10.3)
lymph#: 1.4 10*3/uL (ref 0.9–3.3)

## 2016-06-23 LAB — COMPREHENSIVE METABOLIC PANEL
ALBUMIN: 2.3 g/dL — AB (ref 3.5–5.0)
ALK PHOS: 106 U/L (ref 40–150)
ALT: 14 U/L (ref 0–55)
AST: 15 U/L (ref 5–34)
Anion Gap: 7 mEq/L (ref 3–11)
BUN: 10.3 mg/dL (ref 7.0–26.0)
CALCIUM: 8.7 mg/dL (ref 8.4–10.4)
CO2: 28 mEq/L (ref 22–29)
Chloride: 102 mEq/L (ref 98–109)
Creatinine: 0.5 mg/dL — ABNORMAL LOW (ref 0.6–1.1)
EGFR: >90 mL/min/{1.73_m2} (ref >90)
Glucose: 82 mg/dl (ref 70–140)
POTASSIUM: 3.7 meq/L (ref 3.5–5.1)
Sodium: 136 mEq/L (ref 136–145)
Total Bilirubin: <0.3 mg/dL (ref 0.20–1.20)
Total Protein: 5.5 g/dL — ABNORMAL LOW (ref 6.4–8.3)

## 2016-06-23 LAB — MAGNESIUM: MAGNESIUM: 1.6 mg/dL (ref 1.5–2.5)

## 2016-06-23 MED ORDER — PANITUMUMAB CHEMO INJECTION 100 MG/5ML
5.2000 mg/kg | Freq: Once | INTRAVENOUS | Status: AC
  Administered 2016-06-23: 300 mg via INTRAVENOUS
  Filled 2016-06-23: qty 15

## 2016-06-23 MED ORDER — FLUOROURACIL CHEMO INJECTION 2.5 GM/50ML
400.0000 mg/m2 | Freq: Once | INTRAVENOUS | Status: AC
  Administered 2016-06-23: 650 mg via INTRAVENOUS
  Filled 2016-06-23: qty 13

## 2016-06-23 MED ORDER — ATROPINE SULFATE 1 MG/ML IJ SOLN
0.5000 mg | Freq: Once | INTRAMUSCULAR | Status: AC | PRN
  Administered 2016-06-23: 0.5 mg via INTRAVENOUS

## 2016-06-23 MED ORDER — PALONOSETRON HCL INJECTION 0.25 MG/5ML
0.2500 mg | Freq: Once | INTRAVENOUS | Status: AC
  Administered 2016-06-23: 0.25 mg via INTRAVENOUS

## 2016-06-23 MED ORDER — SODIUM CHLORIDE 0.9 % IV SOLN
2400.0000 mg/m2 | INTRAVENOUS | Status: DC
  Administered 2016-06-23: 3850 mg via INTRAVENOUS
  Filled 2016-06-23: qty 77

## 2016-06-23 MED ORDER — DEXTROSE 5 % IV SOLN
400.0000 mg/m2 | Freq: Once | INTRAVENOUS | Status: AC
  Administered 2016-06-23: 644 mg via INTRAVENOUS
  Filled 2016-06-23: qty 32.2

## 2016-06-23 MED ORDER — SODIUM CHLORIDE 0.9 % IV SOLN
Freq: Once | INTRAVENOUS | Status: AC
  Administered 2016-06-23: 10:00:00 via INTRAVENOUS

## 2016-06-23 MED ORDER — IRINOTECAN HCL CHEMO INJECTION 100 MG/5ML
180.0000 mg/m2 | Freq: Once | INTRAVENOUS | Status: AC
  Administered 2016-06-23: 280 mg via INTRAVENOUS
  Filled 2016-06-23: qty 10

## 2016-06-23 MED ORDER — ATROPINE SULFATE 1 MG/ML IJ SOLN
INTRAMUSCULAR | Status: AC
  Filled 2016-06-23: qty 1

## 2016-06-23 MED ORDER — SODIUM CHLORIDE 0.9 % IV SOLN
10.0000 mg | Freq: Once | INTRAVENOUS | Status: AC
  Administered 2016-06-23: 10 mg via INTRAVENOUS
  Filled 2016-06-23: qty 1

## 2016-06-23 MED ORDER — PALONOSETRON HCL INJECTION 0.25 MG/5ML
INTRAVENOUS | Status: AC
  Filled 2016-06-23: qty 5

## 2016-06-23 NOTE — Telephone Encounter (Signed)
Gave patient avs report and appointments for September and October  °

## 2016-06-23 NOTE — Patient Instructions (Signed)
Implanted Port Insertion, Care After °Refer to this sheet in the next few weeks. These instructions provide you with information on caring for yourself after your procedure. Your health care provider may also give you more specific instructions. Your treatment has been planned according to current medical practices, but problems sometimes occur. Call your health care provider if you have any problems or questions after your procedure. °WHAT TO EXPECT AFTER THE PROCEDURE °After your procedure, it is typical to have the following:  °· Discomfort at the port insertion site. Ice packs to the area will help. °· Bruising on the skin over the port. This will subside in 3-4 days. °HOME CARE INSTRUCTIONS °· After your port is placed, you will get a manufacturer's information card. The card has information about your port. Keep this card with you at all times.   °· Know what kind of port you have. There are many types of ports available.   °· Wear a medical alert bracelet in case of an emergency. This can help alert health care workers that you have a port.   °· The port can stay in for as long as your health care provider believes it is necessary.   °· A home health care nurse may give medicines and take care of the port.   °· You or a family member can get special training and directions for giving medicine and taking care of the port at home.   °SEEK MEDICAL CARE IF:  °· Your port does not flush or you are unable to get a blood return.   °· You have a fever or chills. °SEEK IMMEDIATE MEDICAL CARE IF: °· You have new fluid or pus coming from your incision.   °· You notice a bad smell coming from your incision site.   °· You have swelling, pain, or more redness at the incision or port site.   °· You have chest pain or shortness of breath. °  °This information is not intended to replace advice given to you by your health care provider. Make sure you discuss any questions you have with your health care provider. °  °Document  Released: 07/06/2013 Document Revised: 09/20/2013 Document Reviewed: 07/06/2013 °Elsevier Interactive Patient Education ©2016 Elsevier Inc. ° °

## 2016-06-23 NOTE — Progress Notes (Signed)
Nutrition follow-up completed with patient during chemotherapy for metastatic colon cancer. Weight was documented as 111.5 pounds September 25 up slightly from 111.4 pounds August 29. Patient reports she is eating more often and tries to choose high calorie foods. Patient denies nutrition impact symptoms.  Nutrition diagnosis:  Unintentional weight loss improved. Severe malnutrition continues.  Intervention:  Encouraged patient to increase oral nutrition supplements 2-3 times daily. Reviewed high-calorie high-protein foods and snacks. Provided support and encouragement. Teach back method used.  Monitoring, evaluation, goals: Patient will work to consume high-calorie high-protein foods for weight gain.  Next visit: Monday, October 23, during infusion.  **Disclaimer: This note was dictated with voice recognition software. Similar sounding words can inadvertently be transcribed and this note may contain transcription errors which may not have been corrected upon publication of note.**

## 2016-06-23 NOTE — Patient Instructions (Signed)
[facility] Discharge Instructions for Patients Receiving Chemotherapy  Today you received the following chemotherapy agents Vectibix/Irinotecan/Leucovorin/Fluorouracil.  To help prevent nausea and vomiting after your treatment, we encourage you to take your nausea medication as directed.   If you develop nausea and vomiting that is not controlled by your nausea medication, call the clinic.   BELOW ARE SYMPTOMS THAT SHOULD BE REPORTED IMMEDIATELY:  *FEVER GREATER THAN 100.5 F  *CHILLS WITH OR WITHOUT FEVER  NAUSEA AND VOMITING THAT IS NOT CONTROLLED WITH YOUR NAUSEA MEDICATION  *UNUSUAL SHORTNESS OF BREATH  *UNUSUAL BRUISING OR BLEEDING  TENDERNESS IN MOUTH AND THROAT WITH OR WITHOUT PRESENCE OF ULCERS  *URINARY PROBLEMS  *BOWEL PROBLEMS  UNUSUAL RASH Items with * indicate a potential emergency and should be followed up as soon as possible.  Feel free to call the clinic you have any questions or concerns. The clinic phone number is [phone].  Please show the [facility] at check-in to the Emergency Department and triage nurse.

## 2016-06-23 NOTE — Progress Notes (Signed)
[facility] OFFICE PROGRESS NOTE   Diagnosis: Colon cancer  INTERVAL HISTORY:   Carolyn Carolyn Barton returns as scheduled. She completed another treatment with FOLFOX./Panitumumab on 06/09/2016. She tolerated the chemotherapy therapy well. No nausea/vomiting, mouth sores, or diarrhea. She reports a good appetite and energy level. She has developed linear ulcers at several fingers. No other rash.   Objective:  Vital signs in last 24 hours:  Blood pressure 133/89, pulse 98, temperature 98.2 F (36.8 C), temperature source Oral, resp. rate 18, height _0  (1.651 m), weight 111 lb 8 oz (50.6 kg), SpO2 100 %.    HEENT: No thrush or ulcers Resp: Lungs clear bilaterally Cardio: Regular rate and rhythm GI: No hepatomegaly, left lower quadrant colostomy, nontender Vascular: No leg edema  Skin: Mild dry erythematous rash at the nose and medial malar areas, linear ulcerations at several fingers   Portacath/PICC-without erythema  Lab Results:  Lab Results  Component Value Date   WBC 5.7 06/23/2016   HGB 10.6 (L) 06/23/2016   HCT 33.4 (L) 06/23/2016   MCV 78.8 (L) 06/23/2016   PLT 510 (H) 06/23/2016   NEUTROABS 3.1 06/23/2016      Imaging:  Ct Abdomen Pelvis W Contrast  Result Date: 06/20/2016 CLINICAL DATA:  Restaging metastatic colorectal carcinoma. Followup liver metastasis. EXAM: CT ABDOMEN AND PELVIS WITH CONTRAST TECHNIQUE: Multidetector CT imaging of the abdomen and pelvis was performed using the standard protocol following bolus administration of intravenous contrast. CONTRAST:  33m ISOVUE-300 IOPAMIDOL (ISOVUE-300) INJECTION 61% COMPARISON:  03/10/2016, 02/29/2016 FINDINGS: Lower chest: Lung bases are clear. Hepatobiliary: Irregular Low-density lesion in the LEFT hepatic lobe centrally measures 12 mm (19, series 2) decreased from 22 mm on CT 02/29/2016. Smaller hyperdense lesion in the RIGHT leg hepatic lobe measuring 5 mm unchanged and favors a benign cyst. 5 mm  irregular hypodense lesion on image 10, series 2 in the superior LEFT hepatic lobe is unchanged. No new hepatic lesions. Gallbladder normal Pancreas: Pancreas is normal. No ductal dilatation. No pancreatic inflammation. Spleen: Normal spleen Adrenals/urinary tract: Adrenal glands are normal. LEFT ureteral stent in place. Mild hydronephrosis in the upper pole the LEFT kidney, the lower pole is normal. RIGHT kidney normal. Bladder normal. Stomach/Bowel: Stomach has focal thickening through the cardiac region to 2 cm (image 16, series 2). This likely relates redundant folds related to a hiatal hernia. The gastric body is normal. The duodenum is normal small bowel and cecum are normal. Loop colostomy in the LEFT abdominal wall. There is thickening of the proximal sigmoid colon to 2.6 x 2.0 cm (image 60, series 2). This is a focus of thickening on comparison CT of 02/29/2016 with bowel obstruction. Vascular/Lymphatic: Abdominal aorta is normal caliber. There is no retroperitoneal or periportal lymphadenopathy. No pelvic lymphadenopathy. Reproductive: Uterus and adnexa normal. Other: No free-fluid.  No peritoneal metastasis Musculoskeletal: No aggressive osseous lesion. IMPRESSION: 1. Stable hepatic metastasis in LEFT hepatic lobe. 2. Lesion in the rectosigmoid colon several to comparison exam. Interval relief of obstruction following loop colostomy. 3. No peritoneal disease evident. 4. LEFT ureteral stent in place with LEFT upper pole focal hydronephrosis. Recommend urinalysis to exclude renal infection. These results will be called to the ordering clinician or representative by the Radiologist Assistant, and communication documented in the PACS or zVision Dashboard. Electronically Signed   By: SSuzy BouchardM.D.   On: 06/20/2016 10:26    Medications: I have reviewed the patient's current medications.  Assessment/Plan: 1. Metastatic colorectal cancer-status post an exploratory laparotomy  03/03/2016 revealing a  proximal rectal mass, peritoneal carcinomatosis, and liver metastases  Biopsy of peritoneal nodules 03/03/2016 confirmed metastatic adenocarcinoma consistent with a colon primary  Foundation 1 testing-MSI-stable, tumor mutation burden-low, no BRAF NRAS or KRAS mutation  Cycle 1 FOLFIRI/PANITUMUMAB 04/14/2016  Cycle 2 FOLFIRI/panitumumab 04/28/2016  Cycle 3 FOLFIRI/panitumumab 05/12/2016  Cycle 4 FOLFIRI/panitumumab 05/27/2016  Cycle 5 FOLFIRI/panitumumab 06/09/2016  Restaging CT abdomen/pelvis 06/18/2016-no evidence of disease progression, decreased left hepatic lesion  Cycle 6 FOLFIRI/panitumumab 06/23/2016  2.History of aBowel obstruction secondary to #1  3. Chronic back pain  4. Essential thrombocytosis  5. Early obstruction of the left ureter noted at the time of surgery 03/03/2016-Dr. Tannenbaum placed a left double-J stent 03/18/2016  6. Abdominal wall cellulitis 03/10/2016, blood cultures positive for coagulase negative staphylococcus-methicillin-resistant, status post treatment with vancomycin and Bactrim  7. Tachycardia-persistent following discharge from the hospital, likely related to anemia and deconditioning  CT chest 04/04/2016-negative for pulmonary embolism.  8. Port-A-Cath placement 04/10/2016, interventional radiology  9. Iron deficiency anemia. Feraheme 11/05/2015 , 11/12/2015,and 04/14/2016-improved  10. Pain/tenderness left posterior iliac region-resolved  11. Hypokalemia-started on potassium replacement 06/09/2016      Disposition:  Carolyn Carolyn Barton appears stable. The CEA has normalized and the restaging CT shows no evidence of disease progression. I will present her case at the GI tumor conference this week. The plan is to continue FOLFIRI/panitumumab. We will discuss the indication for additional imaging and future potential for hepatic resection with the surgical service.  She will try Vaseline for the finger ulcers.  Ms.  Carolyn Barton will return for an office visit and chemotherapy in 2 weeks.  [name] Coder, MD  06/23/2016  1:57 PM

## 2016-06-25 ENCOUNTER — Ambulatory Visit (HOSPITAL_BASED_OUTPATIENT_CLINIC_OR_DEPARTMENT_OTHER): Payer: BLUE CROSS/BLUE SHIELD

## 2016-06-25 VITALS — BP 114/82 | HR 92 | Temp 97.9°F | Resp 18

## 2016-06-25 DIAGNOSIS — Z23 Encounter for immunization: Secondary | ICD-10-CM

## 2016-06-25 DIAGNOSIS — C20 Malignant neoplasm of rectum: Secondary | ICD-10-CM

## 2016-06-25 MED ORDER — SODIUM CHLORIDE 0.9% FLUSH
10.0000 mL | INTRAVENOUS | Status: DC | PRN
  Administered 2016-06-25: 10 mL
  Filled 2016-06-25: qty 10

## 2016-06-25 MED ORDER — INFLUENZA VAC SPLIT QUAD 0.5 ML IM SUSY
0.5000 mL | PREFILLED_SYRINGE | Freq: Once | INTRAMUSCULAR | Status: AC
  Administered 2016-06-25: 0.5 mL via INTRAMUSCULAR
  Filled 2016-06-25: qty 0.5

## 2016-06-25 MED ORDER — HEPARIN SOD (PORK) LOCK FLUSH 100 UNIT/ML IV SOLN
500.0000 [IU] | Freq: Once | INTRAVENOUS | Status: AC | PRN
  Administered 2016-06-25: 500 [IU]
  Filled 2016-06-25: qty 5

## 2016-07-06 ENCOUNTER — Other Ambulatory Visit: Payer: Self-pay | Admitting: Oncology

## 2016-07-07 ENCOUNTER — Other Ambulatory Visit (HOSPITAL_BASED_OUTPATIENT_CLINIC_OR_DEPARTMENT_OTHER): Payer: BLUE CROSS/BLUE SHIELD

## 2016-07-07 ENCOUNTER — Other Ambulatory Visit: Payer: Self-pay | Admitting: *Deleted

## 2016-07-07 ENCOUNTER — Ambulatory Visit: Payer: BLUE CROSS/BLUE SHIELD | Admitting: Nurse Practitioner

## 2016-07-07 ENCOUNTER — Ambulatory Visit (HOSPITAL_BASED_OUTPATIENT_CLINIC_OR_DEPARTMENT_OTHER): Payer: BLUE CROSS/BLUE SHIELD | Admitting: Oncology

## 2016-07-07 ENCOUNTER — Telehealth: Payer: Self-pay | Admitting: Oncology

## 2016-07-07 ENCOUNTER — Ambulatory Visit (HOSPITAL_BASED_OUTPATIENT_CLINIC_OR_DEPARTMENT_OTHER): Payer: BLUE CROSS/BLUE SHIELD

## 2016-07-07 VITALS — BP 127/88 | HR 96 | Temp 97.9°F | Resp 17 | Ht 65.0 in | Wt 108.7 lb

## 2016-07-07 DIAGNOSIS — C19 Malignant neoplasm of rectosigmoid junction: Secondary | ICD-10-CM

## 2016-07-07 DIAGNOSIS — D473 Essential (hemorrhagic) thrombocythemia: Secondary | ICD-10-CM

## 2016-07-07 DIAGNOSIS — M549 Dorsalgia, unspecified: Secondary | ICD-10-CM

## 2016-07-07 DIAGNOSIS — C787 Secondary malignant neoplasm of liver and intrahepatic bile duct: Secondary | ICD-10-CM | POA: Diagnosis not present

## 2016-07-07 DIAGNOSIS — C20 Malignant neoplasm of rectum: Secondary | ICD-10-CM

## 2016-07-07 DIAGNOSIS — C786 Secondary malignant neoplasm of retroperitoneum and peritoneum: Secondary | ICD-10-CM

## 2016-07-07 DIAGNOSIS — E876 Hypokalemia: Secondary | ICD-10-CM

## 2016-07-07 DIAGNOSIS — Z5111 Encounter for antineoplastic chemotherapy: Secondary | ICD-10-CM | POA: Diagnosis not present

## 2016-07-07 DIAGNOSIS — Z5112 Encounter for antineoplastic immunotherapy: Secondary | ICD-10-CM

## 2016-07-07 DIAGNOSIS — D509 Iron deficiency anemia, unspecified: Secondary | ICD-10-CM

## 2016-07-07 LAB — COMPREHENSIVE METABOLIC PANEL
ALK PHOS: 107 U/L (ref 40–150)
ALT: 12 U/L (ref 0–55)
ANION GAP: 7 meq/L (ref 3–11)
AST: 15 U/L (ref 5–34)
Albumin: 2.5 g/dL — ABNORMAL LOW (ref 3.5–5.0)
BUN: 9.8 mg/dL (ref 7.0–26.0)
CALCIUM: 8.8 mg/dL (ref 8.4–10.4)
CO2: 25 mEq/L (ref 22–29)
CREATININE: 0.5 mg/dL — AB (ref 0.6–1.1)
Chloride: 104 mEq/L (ref 98–109)
EGFR: >90 mL/min/{1.73_m2} (ref >90)
Glucose: 97 mg/dl (ref 70–140)
Potassium: 3.2 mEq/L — ABNORMAL LOW (ref 3.5–5.1)
Sodium: 137 mEq/L (ref 136–145)
TOTAL PROTEIN: 5.6 g/dL — AB (ref 6.4–8.3)

## 2016-07-07 LAB — CBC WITH DIFFERENTIAL/PLATELET
BASO%: 1.3 % (ref 0.0–2.0)
Basophils Absolute: 0.1 10*3/uL (ref 0.0–0.1)
EOS%: 8.8 % — ABNORMAL HIGH (ref 0.0–7.0)
Eosinophils Absolute: 0.5 10*3/uL (ref 0.0–0.5)
HEMATOCRIT: 36.6 % (ref 34.8–46.6)
HGB: 11.5 g/dL — ABNORMAL LOW (ref 11.6–15.9)
LYMPH%: 24.6 % (ref 14.0–49.7)
MCH: 25 pg — ABNORMAL LOW (ref 25.1–34.0)
MCHC: 31.5 g/dL (ref 31.5–36.0)
MCV: 79.4 fL — ABNORMAL LOW (ref 79.5–101.0)
MONO#: 0.6 10*3/uL (ref 0.1–0.9)
MONO%: 10.3 % (ref 0.0–14.0)
NEUT%: 55 % (ref 38.4–76.8)
NEUTROS ABS: 3 10*3/uL (ref 1.5–6.5)
PLATELETS: 474 10*3/uL — AB (ref 145–400)
RBC: 4.61 10*6/uL (ref 3.70–5.45)
RDW: 23.2 % — AB (ref 11.2–14.5)
WBC: 5.6 10*3/uL (ref 3.9–10.3)
lymph#: 1.4 10*3/uL (ref 0.9–3.3)

## 2016-07-07 LAB — MAGNESIUM: MAGNESIUM: 1.6 mg/dL (ref 1.5–2.5)

## 2016-07-07 MED ORDER — FLUOROURACIL CHEMO INJECTION 2.5 GM/50ML
400.0000 mg/m2 | Freq: Once | INTRAVENOUS | Status: AC
  Administered 2016-07-07: 650 mg via INTRAVENOUS
  Filled 2016-07-07: qty 13

## 2016-07-07 MED ORDER — SODIUM CHLORIDE 0.9 % IV SOLN
Freq: Once | INTRAVENOUS | Status: AC
  Administered 2016-07-07: 12:00:00 via INTRAVENOUS

## 2016-07-07 MED ORDER — SODIUM CHLORIDE 0.9 % IV SOLN
300.0000 mg | Freq: Once | INTRAVENOUS | Status: AC
  Administered 2016-07-07: 300 mg via INTRAVENOUS
  Filled 2016-07-07: qty 15

## 2016-07-07 MED ORDER — DEXTROSE 5 % IV SOLN
180.0000 mg/m2 | Freq: Once | INTRAVENOUS | Status: AC
  Administered 2016-07-07: 280 mg via INTRAVENOUS
  Filled 2016-07-07: qty 4

## 2016-07-07 MED ORDER — SODIUM CHLORIDE 0.9 % IV SOLN: Freq: Once | INTRAVENOUS | Status: AC

## 2016-07-07 MED ORDER — POTASSIUM CHLORIDE ER 10 MEQ PO TBCR: 10.0000 meq | EXTENDED_RELEASE_TABLET | Freq: Every day | ORAL | 1 refills | Status: DC

## 2016-07-07 MED ORDER — PALONOSETRON HCL INJECTION 0.25 MG/5ML
INTRAVENOUS | Status: AC
  Filled 2016-07-07: qty 5

## 2016-07-07 MED ORDER — PALONOSETRON HCL INJECTION 0.25 MG/5ML
0.2500 mg | Freq: Once | INTRAVENOUS | Status: AC
  Administered 2016-07-07: 0.25 mg via INTRAVENOUS

## 2016-07-07 MED ORDER — DEXAMETHASONE SODIUM PHOSPHATE 100 MG/10ML IJ SOLN
10.0000 mg | Freq: Once | INTRAMUSCULAR | Status: AC
  Administered 2016-07-07: 10 mg via INTRAVENOUS
  Filled 2016-07-07: qty 1

## 2016-07-07 MED ORDER — DEXTROSE 5 % IV SOLN
400.0000 mg/m2 | Freq: Once | INTRAVENOUS | Status: AC
  Administered 2016-07-07: 644 mg via INTRAVENOUS
  Filled 2016-07-07: qty 32.2

## 2016-07-07 MED ORDER — ATROPINE SULFATE 1 MG/ML IJ SOLN
0.5000 mg | Freq: Once | INTRAMUSCULAR | Status: AC | PRN
  Administered 2016-07-07: 0.5 mg via INTRAVENOUS

## 2016-07-07 MED ORDER — SODIUM CHLORIDE 0.9% FLUSH
10.0000 mL | Freq: Once | INTRAVENOUS | Status: AC
  Administered 2016-07-07: 10 mL via INTRAVENOUS
  Filled 2016-07-07: qty 10

## 2016-07-07 MED ORDER — SODIUM CHLORIDE 0.9 % IV SOLN
2400.0000 mg/m2 | INTRAVENOUS | Status: DC
  Administered 2016-07-07: 3850 mg via INTRAVENOUS
  Filled 2016-07-07: qty 77

## 2016-07-07 MED ORDER — ATROPINE SULFATE 1 MG/ML IJ SOLN
INTRAMUSCULAR | Status: AC
  Filled 2016-07-07: qty 1

## 2016-07-07 NOTE — Patient Instructions (Signed)

## 2016-07-07 NOTE — Progress Notes (Signed)
Patient states having difficulty swallowing potassium capsules.  Call placed to [facility] and Maudie Mercury stated that potassium tablets are smaller and easier to swallow for patients.  Prescription for potassium tablets called in to [facility] for patient.

## 2016-07-07 NOTE — Telephone Encounter (Signed)
Gave patient avs report and appointments for October and November.  °

## 2016-07-07 NOTE — Patient Instructions (Signed)
[facility] Discharge Instructions for Patients Receiving Chemotherapy  Today you received the following chemotherapy agents Vectibix/5 Fu/Leucovorin/Irinotecan To help prevent nausea and vomiting after your treatment, we encourage you to take your nausea medication as prescribed.   If you develop nausea and vomiting that is not controlled by your nausea medication, call the clinic.   BELOW ARE SYMPTOMS THAT SHOULD BE REPORTED IMMEDIATELY:  *FEVER GREATER THAN 100.5 F  *CHILLS WITH OR WITHOUT FEVER  NAUSEA AND VOMITING THAT IS NOT CONTROLLED WITH YOUR NAUSEA MEDICATION  *UNUSUAL SHORTNESS OF BREATH  *UNUSUAL BRUISING OR BLEEDING  TENDERNESS IN MOUTH AND THROAT WITH OR WITHOUT PRESENCE OF ULCERS  *URINARY PROBLEMS  *BOWEL PROBLEMS  UNUSUAL RASH Items with * indicate a potential emergency and should be followed up as soon as possible.  Feel free to call the clinic you have any questions or concerns. The clinic phone number is [phone].  Please show the [facility] at check-in to the Emergency Department and triage nurse.

## 2016-07-07 NOTE — Progress Notes (Signed)
Called [employer] for peer-to-peer review for pt's chemo treatment. "This has already been approved." Effective date 07/07/16- 03/09/2017. ID:[id number].

## 2016-07-07 NOTE — Progress Notes (Signed)
  [place] OFFICE PROGRESS NOTE   Diagnosis: Colon cancer  INTERVAL HISTORY:   Carolyn Barton returns as scheduled. She completed another treatment with FOLFIRI/panitumumab 06/23/2016. No nausea, mouth sores, or diarrhea. The paronychia improved when she began vasoline. She reports a good appetite and energy level. The abdominal wound is healing. Objective:  Vital signs in last 24 hours:  Blood pressure 127/88, pulse 96, temperature 97.9 F (36.6 C), temperature source Oral, resp. rate 17, height '5\' 5"'$  (1.651 m), weight 108 lb 11.2 oz (49.3 kg), SpO2 99 %.    HEENT: No thrush or ulcers. Resp: Lungs clear bilaterally Cardio: Regular rate and rhythm GI: No hepatomegaly, left lower quadrant colostomy Vascular: No leg edema  Skin: Mild changes of paronychia at the right fingers, mild acne type rash surrounding the mouth and nose   Portacath/PICC-without erythema  Lab Results:  Lab Results  Component Value Date   WBC 5.7 06/23/2016   HGB 10.6 (L) 06/23/2016   HCT 33.4 (L) 06/23/2016   MCV 78.8 (L) 06/23/2016   PLT 510 (H) 06/23/2016   NEUTROABS 3.1 06/23/2016   06/09/2016: CEA-1.7  Medications: I have reviewed the patient's current medications.  Assessment/Plan: 1. Metastatic colorectal cancer-status post an exploratory laparotomy 03/03/2016 revealing a proximal rectal mass, peritoneal carcinomatosis, and liver metastases  Biopsy of peritoneal nodules 03/03/2016 confirmed metastatic adenocarcinoma consistent with a colon primary  Foundation 1 testing-MSI-stable, tumor mutation burden-low, no BRAF NRAS or KRAS mutation  Cycle 1 FOLFIRI/PANITUMUMAB 04/14/2016  Cycle 2 FOLFIRI/panitumumab 04/28/2016  Cycle 3 FOLFIRI/panitumumab 05/12/2016  Cycle 4 FOLFIRI/panitumumab 05/27/2016  Cycle 5 FOLFIRI/panitumumab 06/09/2016  Restaging CT abdomen/pelvis 06/20/2016-no evidence of disease progression, decreased left hepatic lesion  Cycle 6 FOLFIRI/panitumumab  06/23/2016  Cycle 7 FOLFIRI/panitumumab 07/07/2016  2.History of aBowel obstruction secondary to #1  3. Chronic back pain  4. Essential thrombocytosis  5. Early obstruction of the left ureter noted at the time of surgery 03/03/2016-Dr. Tannenbaum placed a left double-J stent 03/18/2016  6. Abdominal wall cellulitis 03/10/2016, blood cultures positive for coagulase negative staphylococcus-methicillin-resistant, status post treatment with vancomycin and Bactrim  7. Tachycardia-persistent following discharge from the hospital, likely related to anemia and deconditioning  CT chest 04/04/2016-negative for pulmonary embolism.  8. Port-A-Cath placement 04/10/2016, interventional radiology  9. Iron deficiency anemia. Feraheme 11/05/2015 , 11/12/2015,and 04/14/2016-improved  10. Pain/tenderness left posterior iliac region-resolved  11. Hypokalemia-started on potassium replacement 06/09/2016       Disposition:  Her overall performance status continues to improve. The plan is to continue FOLFIRI/panitumumab. I'll present her case at the GI tumor conference 2 weeks ago. She does not appear to be a hepatic resection candidate based on the carcinomatosis at presentation. She would like to consider resection of the primary tumor and colostomy takedown if possible. I will contact Dr. Brantley Stage.  Carolyn Barton will return for an office visit and chemotherapy in 2 weeks. I reviewed the 06/20/2016 CT images with her.  [name] Coder, MD  07/07/2016  10:39 AM

## 2016-07-08 ENCOUNTER — Telehealth: Payer: Self-pay | Admitting: *Deleted

## 2016-07-08 NOTE — Telephone Encounter (Signed)
-----   Message from Ladell Pier, MD sent at 07/07/2016  4:58 PM EDT ----- Please call patient, increase potassium to 52meq daily

## 2016-07-08 NOTE — Telephone Encounter (Signed)
Left message on voicemail with instructions to increase potassium to 55meq daily.

## 2016-07-09 ENCOUNTER — Ambulatory Visit (HOSPITAL_BASED_OUTPATIENT_CLINIC_OR_DEPARTMENT_OTHER): Payer: BLUE CROSS/BLUE SHIELD

## 2016-07-09 VITALS — BP 120/77 | HR 107 | Temp 98.4°F | Resp 18

## 2016-07-09 DIAGNOSIS — C787 Secondary malignant neoplasm of liver and intrahepatic bile duct: Secondary | ICD-10-CM | POA: Diagnosis not present

## 2016-07-09 DIAGNOSIS — C20 Malignant neoplasm of rectum: Secondary | ICD-10-CM | POA: Diagnosis not present

## 2016-07-09 DIAGNOSIS — C786 Secondary malignant neoplasm of retroperitoneum and peritoneum: Secondary | ICD-10-CM

## 2016-07-09 MED ORDER — HEPARIN SOD (PORK) LOCK FLUSH 100 UNIT/ML IV SOLN
500.0000 [IU] | Freq: Once | INTRAVENOUS | Status: DC | PRN
  Filled 2016-07-09: qty 5

## 2016-07-09 MED ORDER — SODIUM CHLORIDE 0.9% FLUSH
10.0000 mL | INTRAVENOUS | Status: DC | PRN
Start: 2016-07-09 — End: 2016-07-09
  Filled 2016-07-09: qty 10

## 2016-07-18 ENCOUNTER — Encounter: Payer: Self-pay | Admitting: Pharmacist

## 2016-07-20 ENCOUNTER — Other Ambulatory Visit: Payer: Self-pay | Admitting: Oncology

## 2016-07-21 ENCOUNTER — Ambulatory Visit: Payer: BLUE CROSS/BLUE SHIELD | Admitting: Nutrition

## 2016-07-21 ENCOUNTER — Ambulatory Visit (HOSPITAL_BASED_OUTPATIENT_CLINIC_OR_DEPARTMENT_OTHER): Payer: BLUE CROSS/BLUE SHIELD

## 2016-07-21 ENCOUNTER — Telehealth: Payer: Self-pay | Admitting: Oncology

## 2016-07-21 ENCOUNTER — Ambulatory Visit: Payer: BLUE CROSS/BLUE SHIELD

## 2016-07-21 ENCOUNTER — Ambulatory Visit (HOSPITAL_BASED_OUTPATIENT_CLINIC_OR_DEPARTMENT_OTHER): Payer: BLUE CROSS/BLUE SHIELD | Admitting: Oncology

## 2016-07-21 ENCOUNTER — Other Ambulatory Visit (HOSPITAL_BASED_OUTPATIENT_CLINIC_OR_DEPARTMENT_OTHER): Payer: BLUE CROSS/BLUE SHIELD

## 2016-07-21 VITALS — BP 126/84 | HR 102 | Temp 98.3°F | Resp 17 | Ht 65.0 in | Wt 111.9 lb

## 2016-07-21 DIAGNOSIS — C20 Malignant neoplasm of rectum: Secondary | ICD-10-CM

## 2016-07-21 DIAGNOSIS — C787 Secondary malignant neoplasm of liver and intrahepatic bile duct: Secondary | ICD-10-CM | POA: Diagnosis not present

## 2016-07-21 DIAGNOSIS — C19 Malignant neoplasm of rectosigmoid junction: Secondary | ICD-10-CM

## 2016-07-21 DIAGNOSIS — C786 Secondary malignant neoplasm of retroperitoneum and peritoneum: Secondary | ICD-10-CM

## 2016-07-21 DIAGNOSIS — Z5112 Encounter for antineoplastic immunotherapy: Secondary | ICD-10-CM

## 2016-07-21 DIAGNOSIS — E876 Hypokalemia: Secondary | ICD-10-CM

## 2016-07-21 DIAGNOSIS — Z5111 Encounter for antineoplastic chemotherapy: Secondary | ICD-10-CM

## 2016-07-21 DIAGNOSIS — D509 Iron deficiency anemia, unspecified: Secondary | ICD-10-CM

## 2016-07-21 DIAGNOSIS — M549 Dorsalgia, unspecified: Secondary | ICD-10-CM

## 2016-07-21 DIAGNOSIS — Z95828 Presence of other vascular implants and grafts: Secondary | ICD-10-CM

## 2016-07-21 DIAGNOSIS — D473 Essential (hemorrhagic) thrombocythemia: Secondary | ICD-10-CM

## 2016-07-21 LAB — COMPREHENSIVE METABOLIC PANEL
ALBUMIN: 2.7 g/dL — AB (ref 3.5–5.0)
ALK PHOS: 104 U/L (ref 40–150)
ALT: 14 U/L (ref 0–55)
AST: 15 U/L (ref 5–34)
Anion Gap: 7 mEq/L (ref 3–11)
BUN: 12.5 mg/dL (ref 7.0–26.0)
CO2: 25 meq/L (ref 22–29)
Calcium: 8.9 mg/dL (ref 8.4–10.4)
Chloride: 103 mEq/L (ref 98–109)
Creatinine: 0.5 mg/dL — ABNORMAL LOW (ref 0.6–1.1)
GLUCOSE: 68 mg/dL — AB (ref 70–140)
POTASSIUM: 3.5 meq/L (ref 3.5–5.1)
SODIUM: 136 meq/L (ref 136–145)
Total Bilirubin: <0.22 mg/dL (ref 0.20–1.20)
Total Protein: 5.7 g/dL — ABNORMAL LOW (ref 6.4–8.3)

## 2016-07-21 LAB — CBC WITH DIFFERENTIAL/PLATELET
BASO%: 1.3 % (ref 0.0–2.0)
BASOS ABS: 0.1 10*3/uL (ref 0.0–0.1)
EOS ABS: 0.4 10*3/uL (ref 0.0–0.5)
EOS%: 7.6 % — ABNORMAL HIGH (ref 0.0–7.0)
HCT: 34.2 % — ABNORMAL LOW (ref 34.8–46.6)
HGB: 11 g/dL — ABNORMAL LOW (ref 11.6–15.9)
LYMPH%: 28.9 % (ref 14.0–49.7)
MCH: 25.8 pg (ref 25.1–34.0)
MCHC: 32 g/dL (ref 31.5–36.0)
MCV: 80.6 fL (ref 79.5–101.0)
MONO#: 0.5 10*3/uL (ref 0.1–0.9)
MONO%: 11 % (ref 0.0–14.0)
NEUT#: 2.4 10*3/uL (ref 1.5–6.5)
NEUT%: 51.2 % (ref 38.4–76.8)
Platelets: 446 10*3/uL — ABNORMAL HIGH (ref 145–400)
RBC: 4.24 10*6/uL (ref 3.70–5.45)
RDW: 21.5 % — ABNORMAL HIGH (ref 11.2–14.5)
WBC: 4.7 10*3/uL (ref 3.9–10.3)
lymph#: 1.3 10*3/uL (ref 0.9–3.3)

## 2016-07-21 LAB — MAGNESIUM: Magnesium: 1.6 mg/dl (ref 1.5–2.5)

## 2016-07-21 LAB — CEA (IN HOUSE-CHCC): CEA (CHCC-IN HOUSE): 1.28 ng/mL (ref 0.00–5.00)

## 2016-07-21 MED ORDER — PALONOSETRON HCL INJECTION 0.25 MG/5ML
0.2500 mg | Freq: Once | INTRAVENOUS | Status: AC
  Administered 2016-07-21: 0.25 mg via INTRAVENOUS

## 2016-07-21 MED ORDER — LEUCOVORIN CALCIUM INJECTION 350 MG
400.0000 mg/m2 | Freq: Once | INTRAVENOUS | Status: AC
  Administered 2016-07-21: 644 mg via INTRAVENOUS
  Filled 2016-07-21: qty 32.2

## 2016-07-21 MED ORDER — SODIUM CHLORIDE 0.9 % IV SOLN
2400.0000 mg/m2 | INTRAVENOUS | Status: DC
  Administered 2016-07-21: 3850 mg via INTRAVENOUS
  Filled 2016-07-21: qty 77

## 2016-07-21 MED ORDER — PALONOSETRON HCL INJECTION 0.25 MG/5ML
INTRAVENOUS | Status: AC
  Filled 2016-07-21: qty 5

## 2016-07-21 MED ORDER — ATROPINE SULFATE 1 MG/ML IJ SOLN
INTRAMUSCULAR | Status: AC
  Filled 2016-07-21: qty 1

## 2016-07-21 MED ORDER — DEXAMETHASONE SODIUM PHOSPHATE 10 MG/ML IJ SOLN
10.0000 mg | Freq: Once | INTRAMUSCULAR | Status: AC
  Administered 2016-07-21: 10 mg via INTRAVENOUS

## 2016-07-21 MED ORDER — IRINOTECAN HCL CHEMO INJECTION 100 MG/5ML
180.0000 mg/m2 | Freq: Once | INTRAVENOUS | Status: AC
  Administered 2016-07-21: 280 mg via INTRAVENOUS
  Filled 2016-07-21: qty 10

## 2016-07-21 MED ORDER — SODIUM CHLORIDE 0.9 % IV SOLN
Freq: Once | INTRAVENOUS | Status: AC
  Administered 2016-07-21: 10:00:00 via INTRAVENOUS

## 2016-07-21 MED ORDER — SODIUM CHLORIDE 0.9 % IV SOLN
5.2000 mg/kg | Freq: Once | INTRAVENOUS | Status: AC
  Administered 2016-07-21: 300 mg via INTRAVENOUS
  Filled 2016-07-21: qty 5

## 2016-07-21 MED ORDER — FLUOROURACIL CHEMO INJECTION 2.5 GM/50ML
400.0000 mg/m2 | Freq: Once | INTRAVENOUS | Status: AC
  Administered 2016-07-21: 650 mg via INTRAVENOUS
  Filled 2016-07-21: qty 13

## 2016-07-21 MED ORDER — ATROPINE SULFATE 1 MG/ML IJ SOLN
0.5000 mg | Freq: Once | INTRAMUSCULAR | Status: AC | PRN
  Administered 2016-07-21: 0.5 mg via INTRAVENOUS

## 2016-07-21 MED ORDER — DEXAMETHASONE SODIUM PHOSPHATE 10 MG/ML IJ SOLN
INTRAMUSCULAR | Status: AC
  Filled 2016-07-21: qty 1

## 2016-07-21 MED ORDER — SODIUM CHLORIDE 0.9 % IJ SOLN
10.0000 mL | INTRAMUSCULAR | Status: AC | PRN
  Administered 2016-07-21: 10 mL
  Filled 2016-07-21: qty 10

## 2016-07-21 NOTE — Patient Instructions (Signed)
[facility] Discharge Instructions for Patients Receiving Chemotherapy  Today you received the following chemotherapy agents Vectibix/5 Fu/Leucovorin/Irinotecan To help prevent nausea and vomiting after your treatment, we encourage you to take your nausea medication as prescribed.   If you develop nausea and vomiting that is not controlled by your nausea medication, call the clinic.   BELOW ARE SYMPTOMS THAT SHOULD BE REPORTED IMMEDIATELY:  *FEVER GREATER THAN 100.5 F  *CHILLS WITH OR WITHOUT FEVER  NAUSEA AND VOMITING THAT IS NOT CONTROLLED WITH YOUR NAUSEA MEDICATION  *UNUSUAL SHORTNESS OF BREATH  *UNUSUAL BRUISING OR BLEEDING  TENDERNESS IN MOUTH AND THROAT WITH OR WITHOUT PRESENCE OF ULCERS  *URINARY PROBLEMS  *BOWEL PROBLEMS  UNUSUAL RASH Items with * indicate a potential emergency and should be followed up as soon as possible.  Feel free to call the clinic you have any questions or concerns. The clinic phone number is [phone].  Please show the [facility] at check-in to the Emergency Department and triage nurse.

## 2016-07-21 NOTE — Progress Notes (Signed)
Nutrition follow-up completed with patient during chemotherapy for metastatic colon cancer. Weight improved slightly and documented as 111.9 pounds October 23, up from 111 5 pounds September 25. Patient reports she is eating well. She denies nutrition impact symptoms. Patient reports that eating a light lunch has helped her to tolerate food better. Noted glucose 68 and albumin 2.7.  Nutrition diagnosis:  Unintentional weight loss improved. Severe malnutrition continues.  Intervention:  Provided support and encouragement for patient to continue high-calorie, high-protein diet. Encouraged weight maintenance/weight gain. Teach back method used.  Monitoring, evaluation, goals:  Patient will work to consume high-calorie high-protein foods for weight stabilization/weight gain.  Next visit: To be scheduled as needed.  **Disclaimer: This note was dictated with voice recognition software. Similar sounding words can inadvertently be transcribed and this note may contain transcription errors which may not have been corrected upon publication of note.**

## 2016-07-21 NOTE — Telephone Encounter (Signed)
Message sent to infusion scheduler to be added. AVS report and appointment schedule, given to patient per 07/21/16 los.

## 2016-07-21 NOTE — Progress Notes (Signed)
  [place] OFFICE PROGRESS [place]   Diagnosis: Colon cancer  INTERVAL HISTORY:   [name] returns as scheduled. She completed another cycle of FOLFIRI/panitumumab on 07/07/2016. No nausea/vomiting, mouth sores, or diarrhea. Cracking of the skin at her hands has improved with a moisturizer. Good appetite and energy level.  Objective:  Vital signs in last 24 hours:  Blood pressure 126/84, pulse (!) 102, temperature 98.3 F (36.8 C), temperature source Oral, resp. rate 17, height '5\' 5"'$  (1.651 m), weight 111 lb 14.4 oz (50.8 kg), SpO2 100 %.    HEENT: No thrush or ulcers Resp: Lungs clear bilaterally Cardio: Regular rate and rhythm GI: No hepatosplenomegaly, left lower quadrant colostomy, the midline incision has almost completely healed with a 2-37 m remaining superficial opening Vascular: No leg edema  Skin: Mild acne type rash over the face, mild dry cracking at several fingers   Portacath/PICC-without erythema  Lab Results:  Lab Results  Component Value Date   WBC 4.7 07/21/2016   HGB 11.0 (L) 07/21/2016   HCT 34.2 (L) 07/21/2016   MCV 80.6 07/21/2016   PLT 446 (H) 07/21/2016   NEUTROABS 2.4 07/21/2016    Medications: I have reviewed the patient's current medications.  Assessment/Plan: 1. Metastatic colorectal cancer-status post an exploratory laparotomy 03/03/2016 revealing a proximal rectal mass, peritoneal carcinomatosis, and liver metastases  Biopsy of peritoneal nodules 03/03/2016 confirmed metastatic adenocarcinoma consistent with a colon primary  Foundation 1 testing-MSI-stable, tumor mutation burden-low, no BRAF NRAS or KRAS mutation  Cycle 1 FOLFIRI/PANITUMUMAB 04/14/2016  Cycle 2 FOLFIRI/panitumumab 04/28/2016  Cycle 3 FOLFIRI/panitumumab 05/12/2016  Cycle 4 FOLFIRI/panitumumab 05/27/2016  Cycle 5 FOLFIRI/panitumumab 06/09/2016  Restaging CT abdomen/pelvis 06/20/2016-no evidence of disease progression, decreased left hepatic  lesion  Cycle 6 FOLFIRI/panitumumab 06/23/2016  Cycle 7 FOLFIRI/panitumumab 07/07/2016  Cycle 8 FOLFIRI/panitumumab 07/21/2016  2.History of aBowel obstruction secondary to #1  3. Chronic back pain  4. Essential thrombocytosis  5. Early obstruction of the left ureter noted at the time of surgery 03/03/2016-Dr. Tannenbaum placed a left double-J stent 03/18/2016  6. Abdominal wall cellulitis 03/10/2016, blood cultures positive for coagulase negative staphylococcus-methicillin-resistant, status post treatment with vancomycin and Bactrim  7. Tachycardia-persistent following discharge from the hospital, likely related to anemia and deconditioning  CT chest 04/04/2016-negative for pulmonary embolism.  8. Port-A-Cath placement 04/10/2016, interventional radiology  9. Iron deficiency anemia. Feraheme 11/05/2015 , 11/12/2015,and 04/14/2016-improved  10. Pain/tenderness left posterior iliac region-resolved  11. Hypokalemia-started on potassium replacement 06/09/2016     Disposition:  Carolyn Barton appears stable. She is tolerating the chemotherapy well. The plan is to proceed with another cycle of FOLFIRI/panitumumab today. She will return for an office visit and chemotherapy in 2 weeks.  The plan is to schedule a restaging CT evaluation after 10 cycles of FOLFIRI/panitumumab and switch to a maintenance regimen if there is no evidence of progression. We will also refer her to Dr. Brantley Stage to consider resection of the primary/reversal of the colostomy.  [name] Coder, MD  07/21/2016  8:56 AM

## 2016-07-22 ENCOUNTER — Telehealth: Payer: Self-pay | Admitting: *Deleted

## 2016-07-22 NOTE — Telephone Encounter (Signed)
Per LOS I have scheduled appt and notified the scheduler 

## 2016-07-23 ENCOUNTER — Ambulatory Visit (HOSPITAL_BASED_OUTPATIENT_CLINIC_OR_DEPARTMENT_OTHER): Payer: BLUE CROSS/BLUE SHIELD

## 2016-07-23 VITALS — BP 122/81 | HR 106 | Temp 97.9°F | Resp 16

## 2016-07-23 DIAGNOSIS — C20 Malignant neoplasm of rectum: Secondary | ICD-10-CM

## 2016-07-23 MED ORDER — HEPARIN SOD (PORK) LOCK FLUSH 100 UNIT/ML IV SOLN
500.0000 [IU] | Freq: Once | INTRAVENOUS | Status: AC | PRN
  Administered 2016-07-23: 500 [IU]
  Filled 2016-07-23: qty 5

## 2016-07-23 MED ORDER — SODIUM CHLORIDE 0.9% FLUSH
10.0000 mL | INTRAVENOUS | Status: DC | PRN
  Administered 2016-07-23: 10 mL
  Filled 2016-07-23: qty 10

## 2016-08-02 ENCOUNTER — Other Ambulatory Visit: Payer: Self-pay | Admitting: Oncology

## 2016-08-04 ENCOUNTER — Telehealth: Payer: Self-pay | Admitting: Oncology

## 2016-08-04 ENCOUNTER — Ambulatory Visit (HOSPITAL_BASED_OUTPATIENT_CLINIC_OR_DEPARTMENT_OTHER): Payer: BLUE CROSS/BLUE SHIELD | Admitting: Nurse Practitioner

## 2016-08-04 ENCOUNTER — Ambulatory Visit: Payer: BLUE CROSS/BLUE SHIELD

## 2016-08-04 ENCOUNTER — Ambulatory Visit (HOSPITAL_BASED_OUTPATIENT_CLINIC_OR_DEPARTMENT_OTHER): Payer: BLUE CROSS/BLUE SHIELD

## 2016-08-04 ENCOUNTER — Other Ambulatory Visit (HOSPITAL_BASED_OUTPATIENT_CLINIC_OR_DEPARTMENT_OTHER): Payer: BLUE CROSS/BLUE SHIELD

## 2016-08-04 VITALS — BP 128/89 | HR 86 | Temp 98.2°F | Resp 18 | Ht 65.0 in | Wt 114.1 lb

## 2016-08-04 DIAGNOSIS — C786 Secondary malignant neoplasm of retroperitoneum and peritoneum: Secondary | ICD-10-CM

## 2016-08-04 DIAGNOSIS — L270 Generalized skin eruption due to drugs and medicaments taken internally: Secondary | ICD-10-CM | POA: Diagnosis not present

## 2016-08-04 DIAGNOSIS — C19 Malignant neoplasm of rectosigmoid junction: Secondary | ICD-10-CM

## 2016-08-04 DIAGNOSIS — C787 Secondary malignant neoplasm of liver and intrahepatic bile duct: Secondary | ICD-10-CM | POA: Diagnosis not present

## 2016-08-04 DIAGNOSIS — C20 Malignant neoplasm of rectum: Secondary | ICD-10-CM

## 2016-08-04 DIAGNOSIS — M549 Dorsalgia, unspecified: Secondary | ICD-10-CM

## 2016-08-04 DIAGNOSIS — L989 Disorder of the skin and subcutaneous tissue, unspecified: Secondary | ICD-10-CM

## 2016-08-04 DIAGNOSIS — Z5112 Encounter for antineoplastic immunotherapy: Secondary | ICD-10-CM | POA: Diagnosis not present

## 2016-08-04 DIAGNOSIS — Z5111 Encounter for antineoplastic chemotherapy: Secondary | ICD-10-CM | POA: Diagnosis not present

## 2016-08-04 DIAGNOSIS — D509 Iron deficiency anemia, unspecified: Secondary | ICD-10-CM

## 2016-08-04 DIAGNOSIS — Z95828 Presence of other vascular implants and grafts: Secondary | ICD-10-CM

## 2016-08-04 LAB — CBC WITH DIFFERENTIAL/PLATELET
BASO%: 1.3 % (ref 0.0–2.0)
Basophils Absolute: 0.1 10*3/uL (ref 0.0–0.1)
EOS%: 7.4 % — ABNORMAL HIGH (ref 0.0–7.0)
Eosinophils Absolute: 0.3 10*3/uL (ref 0.0–0.5)
HEMATOCRIT: 33.7 % — AB (ref 34.8–46.6)
HEMOGLOBIN: 10.6 g/dL — AB (ref 11.6–15.9)
LYMPH#: 1.1 10*3/uL (ref 0.9–3.3)
LYMPH%: 28.4 % (ref 14.0–49.7)
MCH: 25.5 pg (ref 25.1–34.0)
MCHC: 31.5 g/dL (ref 31.5–36.0)
MCV: 81 fL (ref 79.5–101.0)
MONO#: 0.6 10*3/uL (ref 0.1–0.9)
MONO%: 14 % (ref 0.0–14.0)
NEUT#: 2 10*3/uL (ref 1.5–6.5)
NEUT%: 48.9 % (ref 38.4–76.8)
Platelets: 393 10*3/uL (ref 145–400)
RBC: 4.16 10*6/uL (ref 3.70–5.45)
RDW: 19.7 % — AB (ref 11.2–14.5)
WBC: 4 10*3/uL (ref 3.9–10.3)

## 2016-08-04 LAB — COMPREHENSIVE METABOLIC PANEL
ALBUMIN: 2.5 g/dL — AB (ref 3.5–5.0)
ALT: 12 U/L (ref 0–55)
AST: 17 U/L (ref 5–34)
Alkaline Phosphatase: 133 U/L (ref 40–150)
Anion Gap: 4 mEq/L (ref 3–11)
BUN: 10.2 mg/dL (ref 7.0–26.0)
CALCIUM: 8.8 mg/dL (ref 8.4–10.4)
CHLORIDE: 105 meq/L (ref 98–109)
CO2: 28 mEq/L (ref 22–29)
CREATININE: 0.4 mg/dL — AB (ref 0.6–1.1)
EGFR: >90 mL/min/{1.73_m2} (ref >90)
GLUCOSE: 88 mg/dL (ref 70–140)
Potassium: 3.6 mEq/L (ref 3.5–5.1)
SODIUM: 137 meq/L (ref 136–145)
Total Bilirubin: 0.22 mg/dL (ref 0.20–1.20)
Total Protein: 5.5 g/dL — ABNORMAL LOW (ref 6.4–8.3)

## 2016-08-04 LAB — CEA (IN HOUSE-CHCC): CEA (CHCC-In House): 1.21 ng/mL (ref 0.00–5.00)

## 2016-08-04 LAB — MAGNESIUM: MAGNESIUM: 1.7 mg/dL (ref 1.5–2.5)

## 2016-08-04 MED ORDER — SODIUM CHLORIDE 0.9 % IV SOLN
Freq: Once | INTRAVENOUS | Status: AC
  Administered 2016-08-04: 12:00:00 via INTRAVENOUS

## 2016-08-04 MED ORDER — FLUOROURACIL CHEMO INJECTION 5 GM/100ML
2400.0000 mg/m2 | INTRAVENOUS | Status: DC
  Administered 2016-08-04: 3850 mg via INTRAVENOUS
  Filled 2016-08-04: qty 77

## 2016-08-04 MED ORDER — LEUCOVORIN CALCIUM INJECTION 350 MG
400.0000 mg/m2 | Freq: Once | INTRAMUSCULAR | Status: AC
  Administered 2016-08-04: 644 mg via INTRAVENOUS
  Filled 2016-08-04: qty 32.2

## 2016-08-04 MED ORDER — FLUOROURACIL CHEMO INJECTION 2.5 GM/50ML
400.0000 mg/m2 | Freq: Once | INTRAVENOUS | Status: AC
  Administered 2016-08-04: 650 mg via INTRAVENOUS
  Filled 2016-08-04: qty 13

## 2016-08-04 MED ORDER — ATROPINE SULFATE 1 MG/ML IJ SOLN
INTRAMUSCULAR | Status: AC
  Filled 2016-08-04: qty 1

## 2016-08-04 MED ORDER — SODIUM CHLORIDE 0.9 % IV SOLN
5.2000 mg/kg | Freq: Once | INTRAVENOUS | Status: AC
  Administered 2016-08-04: 300 mg via INTRAVENOUS
  Filled 2016-08-04: qty 15

## 2016-08-04 MED ORDER — PALONOSETRON HCL INJECTION 0.25 MG/5ML
0.2500 mg | Freq: Once | INTRAVENOUS | Status: AC
  Administered 2016-08-04: 0.25 mg via INTRAVENOUS

## 2016-08-04 MED ORDER — DEXAMETHASONE SODIUM PHOSPHATE 10 MG/ML IJ SOLN
10.0000 mg | Freq: Once | INTRAMUSCULAR | Status: AC
  Administered 2016-08-04: 10 mg via INTRAVENOUS

## 2016-08-04 MED ORDER — DEXTROSE 5 % IV SOLN
180.0000 mg/m2 | Freq: Once | INTRAVENOUS | Status: AC
  Administered 2016-08-04: 280 mg via INTRAVENOUS
  Filled 2016-08-04: qty 4

## 2016-08-04 MED ORDER — DEXAMETHASONE SODIUM PHOSPHATE 10 MG/ML IJ SOLN
INTRAMUSCULAR | Status: AC
  Filled 2016-08-04: qty 1

## 2016-08-04 MED ORDER — SODIUM CHLORIDE 0.9 % IV SOLN: Freq: Once | INTRAVENOUS | Status: DC

## 2016-08-04 MED ORDER — SODIUM CHLORIDE 0.9 % IJ SOLN
10.0000 mL | INTRAMUSCULAR | Status: AC | PRN
  Administered 2016-08-04: 10 mL
  Filled 2016-08-04: qty 10

## 2016-08-04 MED ORDER — ATROPINE SULFATE 1 MG/ML IJ SOLN
0.5000 mg | Freq: Once | INTRAMUSCULAR | Status: AC | PRN
  Administered 2016-08-04: 0.5 mg via INTRAVENOUS

## 2016-08-04 MED ORDER — PALONOSETRON HCL INJECTION 0.25 MG/5ML
INTRAVENOUS | Status: AC
  Filled 2016-08-04: qty 5

## 2016-08-04 NOTE — Progress Notes (Addendum)
[place] OFFICE PROGRESS NOTE   Diagnosis:  Colon cancer  INTERVAL HISTORY:   Carolyn Barton returns as scheduled. She completed cycle 8 FOLFIRI/PANITUMUMAB 07/21/2016. She denies nausea/vomiting. No mouth sores. No diarrhea. She notes a dry skin rash. She has crusted lesions on the scalp. She has noted a skin lesion at the left hip and left buttock.  Objective:  Vital signs in last 24 hours:  Blood pressure 128/89, pulse 86, temperature 98.2 F (36.8 C), temperature source Oral, resp. rate 18, height _0  (1.651 m), weight 114 lb 1.6 oz (51.8 kg), SpO2 100 %.    HEENT: No thrush or ulcers. Resp: Lungs clear bilaterally. Cardio: Regular rate and rhythm. GI: Abdomen soft and nontender. No hepatomegaly. Left lower quadrant colostomy. Vascular: Trace pitting edema at the lower leg/ankles bilaterally. Skin: Skin in general has a dry appearance. Mild acne type rash over the face. Crusted lesions frontal/parietal scalp. Lesion left lateral hip and left low buttock; no significant surrounding erythema.   Lab Results:  Lab Results  Component Value Date   WBC 4.0 08/04/2016   HGB 10.6 (L) 08/04/2016   HCT 33.7 (L) 08/04/2016   MCV 81.0 08/04/2016   PLT 393 08/04/2016   NEUTROABS 2.0 08/04/2016    Imaging:  No results found.  Medications: I have reviewed the patient's current medications.  Assessment/Plan: 1. Metastatic colorectal cancer-status post an exploratory laparotomy 03/03/2016 revealing a proximal rectal mass, peritoneal carcinomatosis, and liver metastases  Biopsy of peritoneal nodules 03/03/2016 confirmed metastatic adenocarcinoma consistent with a colon primary  Foundation 1 testing-MSI-stable, tumor mutation burden-low, no BRAF NRAS or KRAS mutation  Cycle 1 FOLFIRI/PANITUMUMAB 04/14/2016  Cycle 2 FOLFIRI/panitumumab 04/28/2016  Cycle 3 FOLFIRI/panitumumab 05/12/2016  Cycle 4 FOLFIRI/panitumumab 05/27/2016  Cycle 5 FOLFIRI/panitumumab  06/09/2016  Restaging CT abdomen/pelvis 06/20/2016-no evidence of disease progression, decreased left hepatic lesion  Cycle 6 FOLFIRI/panitumumab 06/23/2016  Cycle 7 FOLFIRI/panitumumab 07/07/2016  Cycle 8 FOLFIRI/panitumumab 07/21/2016  Cycle 9 FOLFIRI/PANITUMUMAB 08/04/2016  2.History of aBowel obstruction secondary to #1  3. Chronic back pain  4. Essential thrombocytosis  5. Early obstruction of the left ureter noted at the time of surgery 03/03/2016-Dr. Tannenbaum placed a left double-J stent 03/18/2016  6. Abdominal wall cellulitis 03/10/2016, blood cultures positive for coagulase negative staphylococcus-methicillin-resistant, status post treatment with vancomycin and Bactrim  7. Tachycardia-persistent following discharge from the hospital, likely related to anemia and deconditioning  CT chest 04/04/2016-negative for pulmonary embolism.  8. Port-A-Cath placement 04/10/2016, interventional radiology  9. Iron deficiency anemia. Feraheme 11/05/2015 , 11/12/2015,and 04/14/2016-improved  10. Pain/tenderness left posterior iliac region-resolved  11. Hypokalemia-started on potassium replacement 06/09/2016  12. Skin rash secondary to PANITUMUMAB.   [name] appears stable. She has completed 8 cycles of FOLFIRI/PANITUMUMAB. Plan to proceed with cycle 9 today as scheduled. The plan is for restaging CT scans after cycle 10.  She has developed a skin rash secondary to PANITUMUMAB. She continues minocycline.  She will return for a follow-up visit and cycle 10 FOLFIRI/PANITUMUMAB in 2 weeks. She will contact the office in the interim with any problems.  Patient seen with Dr. Benay Spice. 25 minutes were spent face-to-face at today's visit with the majority of that time involved in counseling/coordination of care.    Carolyn Barton ANP/GNP-BC   08/04/2016  10:53 AM This was a shared visit with Carolyn Barton. Carolyn Barton was interviewed and examined.  She has a panitumumab skin rash. There is a posterior lesion at the left lateral abdominal wall with mild  oozing of blood. The bleeding stopped with pressure. I suspect this is a pustule related to the panitumumab.  Carolyn Barton, M.D.

## 2016-08-04 NOTE — Telephone Encounter (Signed)
Gave patient avs report and appointments for November and December  °

## 2016-08-04 NOTE — Patient Instructions (Signed)
[facility] Discharge Instructions for Patients Receiving Chemotherapy  Today you received the following chemotherapy agents Vectibex, Camptosar and Leucovorin.  To help prevent nausea and vomiting after your treatment, we encourage you to take your nausea medication as prescribed.   If you develop nausea and vomiting that is not controlled by your nausea medication, call the clinic.   BELOW ARE SYMPTOMS THAT SHOULD BE REPORTED IMMEDIATELY:  *FEVER GREATER THAN 100.5 F  *CHILLS WITH OR WITHOUT FEVER  NAUSEA AND VOMITING THAT IS NOT CONTROLLED WITH YOUR NAUSEA MEDICATION  *UNUSUAL SHORTNESS OF BREATH  *UNUSUAL BRUISING OR BLEEDING  TENDERNESS IN MOUTH AND THROAT WITH OR WITHOUT PRESENCE OF ULCERS  *URINARY PROBLEMS  *BOWEL PROBLEMS  UNUSUAL RASH Items with * indicate a potential emergency and should be followed up as soon as possible.  Feel free to call the clinic you have any questions or concerns. The clinic phone number is [phone].  Please show the [facility] at check-in to the Emergency Department and triage nurse.

## 2016-08-06 ENCOUNTER — Ambulatory Visit (HOSPITAL_BASED_OUTPATIENT_CLINIC_OR_DEPARTMENT_OTHER): Payer: BLUE CROSS/BLUE SHIELD

## 2016-08-06 VITALS — BP 123/81 | HR 100 | Temp 97.8°F | Resp 18

## 2016-08-06 DIAGNOSIS — C787 Secondary malignant neoplasm of liver and intrahepatic bile duct: Secondary | ICD-10-CM

## 2016-08-06 DIAGNOSIS — C786 Secondary malignant neoplasm of retroperitoneum and peritoneum: Secondary | ICD-10-CM | POA: Diagnosis not present

## 2016-08-06 DIAGNOSIS — C20 Malignant neoplasm of rectum: Secondary | ICD-10-CM | POA: Diagnosis not present

## 2016-08-06 MED ORDER — SODIUM CHLORIDE 0.9% FLUSH
10.0000 mL | INTRAVENOUS | Status: DC | PRN
  Administered 2016-08-06: 10 mL
  Filled 2016-08-06: qty 10

## 2016-08-06 MED ORDER — HEPARIN SOD (PORK) LOCK FLUSH 100 UNIT/ML IV SOLN
500.0000 [IU] | Freq: Once | INTRAVENOUS | Status: AC | PRN
  Administered 2016-08-06: 500 [IU]
  Filled 2016-08-06: qty 5

## 2016-08-17 ENCOUNTER — Other Ambulatory Visit: Payer: Self-pay | Admitting: Oncology

## 2016-08-18 ENCOUNTER — Ambulatory Visit: Payer: BLUE CROSS/BLUE SHIELD

## 2016-08-18 ENCOUNTER — Ambulatory Visit (HOSPITAL_BASED_OUTPATIENT_CLINIC_OR_DEPARTMENT_OTHER): Payer: BLUE CROSS/BLUE SHIELD | Admitting: Oncology

## 2016-08-18 ENCOUNTER — Ambulatory Visit (HOSPITAL_BASED_OUTPATIENT_CLINIC_OR_DEPARTMENT_OTHER): Payer: BLUE CROSS/BLUE SHIELD

## 2016-08-18 ENCOUNTER — Other Ambulatory Visit: Payer: Self-pay | Admitting: *Deleted

## 2016-08-18 ENCOUNTER — Other Ambulatory Visit (HOSPITAL_BASED_OUTPATIENT_CLINIC_OR_DEPARTMENT_OTHER): Payer: BLUE CROSS/BLUE SHIELD

## 2016-08-18 VITALS — BP 145/86 | HR 94 | Temp 98.3°F | Resp 16 | Ht 65.0 in | Wt 111.7 lb

## 2016-08-18 DIAGNOSIS — C20 Malignant neoplasm of rectum: Secondary | ICD-10-CM

## 2016-08-18 DIAGNOSIS — Z5112 Encounter for antineoplastic immunotherapy: Secondary | ICD-10-CM | POA: Diagnosis not present

## 2016-08-18 DIAGNOSIS — C19 Malignant neoplasm of rectosigmoid junction: Secondary | ICD-10-CM

## 2016-08-18 DIAGNOSIS — D473 Essential (hemorrhagic) thrombocythemia: Secondary | ICD-10-CM

## 2016-08-18 DIAGNOSIS — C787 Secondary malignant neoplasm of liver and intrahepatic bile duct: Secondary | ICD-10-CM | POA: Diagnosis not present

## 2016-08-18 DIAGNOSIS — Z452 Encounter for adjustment and management of vascular access device: Secondary | ICD-10-CM

## 2016-08-18 DIAGNOSIS — Z5111 Encounter for antineoplastic chemotherapy: Secondary | ICD-10-CM | POA: Diagnosis not present

## 2016-08-18 DIAGNOSIS — L270 Generalized skin eruption due to drugs and medicaments taken internally: Secondary | ICD-10-CM

## 2016-08-18 DIAGNOSIS — C786 Secondary malignant neoplasm of retroperitoneum and peritoneum: Secondary | ICD-10-CM

## 2016-08-18 DIAGNOSIS — D509 Iron deficiency anemia, unspecified: Secondary | ICD-10-CM

## 2016-08-18 DIAGNOSIS — M549 Dorsalgia, unspecified: Secondary | ICD-10-CM

## 2016-08-18 LAB — COMPREHENSIVE METABOLIC PANEL
ALBUMIN: 2.5 g/dL — AB (ref 3.5–5.0)
ALT: 9 U/L (ref 0–55)
AST: 14 U/L (ref 5–34)
Alkaline Phosphatase: 137 U/L (ref 40–150)
Anion Gap: 5 mEq/L (ref 3–11)
BUN: 11.3 mg/dL (ref 7.0–26.0)
CHLORIDE: 105 meq/L (ref 98–109)
CO2: 26 meq/L (ref 22–29)
Calcium: 8.6 mg/dL (ref 8.4–10.4)
Creatinine: 0.5 mg/dL — ABNORMAL LOW (ref 0.6–1.1)
GLUCOSE: 89 mg/dL (ref 70–140)
POTASSIUM: 3.8 meq/L (ref 3.5–5.1)
SODIUM: 137 meq/L (ref 136–145)
Total Bilirubin: 0.23 mg/dL (ref 0.20–1.20)
Total Protein: 5.4 g/dL — ABNORMAL LOW (ref 6.4–8.3)

## 2016-08-18 LAB — CBC WITH DIFFERENTIAL/PLATELET
BASO%: 0.9 % (ref 0.0–2.0)
Basophils Absolute: 0 10*3/uL (ref 0.0–0.1)
EOS%: 5.2 % (ref 0.0–7.0)
Eosinophils Absolute: 0.2 10*3/uL (ref 0.0–0.5)
HCT: 34.1 % — ABNORMAL LOW (ref 34.8–46.6)
HEMOGLOBIN: 10.8 g/dL — AB (ref 11.6–15.9)
LYMPH#: 1.2 10*3/uL (ref 0.9–3.3)
LYMPH%: 26.4 % (ref 14.0–49.7)
MCH: 25.9 pg (ref 25.1–34.0)
MCHC: 31.7 g/dL (ref 31.5–36.0)
MCV: 81.8 fL (ref 79.5–101.0)
MONO#: 0.6 10*3/uL (ref 0.1–0.9)
MONO%: 13.7 % (ref 0.0–14.0)
NEUT#: 2.5 10*3/uL (ref 1.5–6.5)
NEUT%: 53.8 % (ref 38.4–76.8)
Platelets: 409 10*3/uL — ABNORMAL HIGH (ref 145–400)
RBC: 4.17 10*6/uL (ref 3.70–5.45)
RDW: 18.6 % — AB (ref 11.2–14.5)
WBC: 4.7 10*3/uL (ref 3.9–10.3)
nRBC: 0 % (ref 0–0)

## 2016-08-18 LAB — MAGNESIUM: Magnesium: 1.7 mg/dl (ref 1.5–2.5)

## 2016-08-18 MED ORDER — PALONOSETRON HCL INJECTION 0.25 MG/5ML
0.2500 mg | Freq: Once | INTRAVENOUS | Status: AC
  Administered 2016-08-18: 0.25 mg via INTRAVENOUS

## 2016-08-18 MED ORDER — DEXAMETHASONE SODIUM PHOSPHATE 10 MG/ML IJ SOLN
10.0000 mg | Freq: Once | INTRAMUSCULAR | Status: AC
  Administered 2016-08-18: 10 mg via INTRAVENOUS

## 2016-08-18 MED ORDER — ATROPINE SULFATE 1 MG/ML IJ SOLN
0.5000 mg | Freq: Once | INTRAMUSCULAR | Status: AC | PRN
  Administered 2016-08-18: 0.5 mg via INTRAVENOUS

## 2016-08-18 MED ORDER — LEUCOVORIN CALCIUM INJECTION 350 MG
400.0000 mg/m2 | Freq: Once | INTRAVENOUS | Status: AC
  Administered 2016-08-18: 644 mg via INTRAVENOUS
  Filled 2016-08-18: qty 32.2

## 2016-08-18 MED ORDER — PALONOSETRON HCL INJECTION 0.25 MG/5ML
INTRAVENOUS | Status: AC
  Filled 2016-08-18: qty 5

## 2016-08-18 MED ORDER — SODIUM CHLORIDE 0.9 % IV SOLN
300.0000 mg | Freq: Once | INTRAVENOUS | Status: AC
  Administered 2016-08-18: 300 mg via INTRAVENOUS
  Filled 2016-08-18: qty 15

## 2016-08-18 MED ORDER — FLUOROURACIL CHEMO INJECTION 2.5 GM/50ML
400.0000 mg/m2 | Freq: Once | INTRAVENOUS | Status: AC
  Administered 2016-08-18: 650 mg via INTRAVENOUS
  Filled 2016-08-18: qty 13

## 2016-08-18 MED ORDER — SODIUM CHLORIDE 0.9 % IJ SOLN
10.0000 mL | INTRAMUSCULAR | Status: DC | PRN
  Administered 2016-08-18: 10 mL via INTRAVENOUS
  Filled 2016-08-18: qty 10

## 2016-08-18 MED ORDER — SODIUM CHLORIDE 0.9 % IV SOLN
Freq: Once | INTRAVENOUS | Status: AC
  Administered 2016-08-18: 13:00:00 via INTRAVENOUS

## 2016-08-18 MED ORDER — DEXAMETHASONE SODIUM PHOSPHATE 10 MG/ML IJ SOLN
INTRAMUSCULAR | Status: AC
  Filled 2016-08-18: qty 1

## 2016-08-18 MED ORDER — MINOCYCLINE HCL 100 MG PO CAPS: 100.0000 mg | ORAL_CAPSULE | Freq: Two times a day (BID) | ORAL | 3 refills | Status: DC

## 2016-08-18 MED ORDER — ALTEPLASE 2 MG IJ SOLR
2.0000 mg | Freq: Once | INTRAMUSCULAR | Status: AC | PRN
  Administered 2016-08-18: 2 mg
  Filled 2016-08-18: qty 2

## 2016-08-18 MED ORDER — DEXTROSE 5 % IV SOLN
180.0000 mg/m2 | Freq: Once | INTRAVENOUS | Status: AC
  Administered 2016-08-18: 280 mg via INTRAVENOUS
  Filled 2016-08-18: qty 10

## 2016-08-18 MED ORDER — SODIUM CHLORIDE 0.9 % IV SOLN
2400.0000 mg/m2 | INTRAVENOUS | Status: DC
  Administered 2016-08-18: 3850 mg via INTRAVENOUS
  Filled 2016-08-18: qty 77

## 2016-08-18 MED ORDER — ATROPINE SULFATE 1 MG/ML IJ SOLN
INTRAMUSCULAR | Status: AC
  Filled 2016-08-18: qty 1

## 2016-08-18 NOTE — Progress Notes (Signed)
  [place] OFFICE PROGRESS NOTE   Diagnosis: Colon cancer  INTERVAL HISTORY:   Carolyn Barton returns as scheduled. She completed another treatment with FOLFIRI/panitumumab on 08/04/2016. She tolerated the chemotherapy well. No new complaint. She continues to have dryness of the skin and a few areas of cracking at the hands. The abdominal wound continues to heal. Good appetite.  Objective:  Vital signs in last 24 hours:  Blood pressure (!) 145/86, pulse 94, temperature 98.3 F (36.8 C), temperature source Oral, resp. rate 16, height '5\' 5"'$  (1.651 m), weight 111 lb 11.2 oz (50.7 kg), SpO2 100 %.    HEENT: No thrush or ulcers Resp: Scattered coarse rhonchi at the posterior chest, no respiratory distress, good air movement bilaterally Cardio: Regular rate and rhythm GI: No hepatomegaly, nontender, left lower quadrant colostomy, the midline wound has superficial granulation tissue and has closed Vascular: No leg edema  Skin: Dryness and flaking over the trunk, few areas of paronychia at the right hand   Portacath/PICC-without erythema  Lab Results:  Lab Results  Component Value Date   WBC 4.7 08/18/2016   HGB 10.8 (L) 08/18/2016   HCT 34.1 (L) 08/18/2016   MCV 81.8 08/18/2016   PLT 409 (H) 08/18/2016   NEUTROABS 2.5 08/18/2016    Medications: I have reviewed the patient's current medications.  Assessment/Plan: 1. Metastatic colorectal cancer-status post an exploratory laparotomy 03/03/2016 revealing a proximal rectal mass, peritoneal carcinomatosis, and liver metastases  Biopsy of peritoneal nodules 03/03/2016 confirmed metastatic adenocarcinoma consistent with a colon primary  Foundation 1 testing-MSI-stable, tumor mutation burden-low, no BRAF NRAS or KRAS mutation  Cycle 1 FOLFIRI/PANITUMUMAB 04/14/2016  Cycle 2 FOLFIRI/panitumumab 04/28/2016  Cycle 3 FOLFIRI/panitumumab 05/12/2016  Cycle 4 FOLFIRI/panitumumab 05/27/2016  Cycle 5 FOLFIRI/panitumumab  06/09/2016  Restaging CT abdomen/pelvis 06/20/2016-no evidence of disease progression, decreased left hepatic lesion  Cycle 6 FOLFIRI/panitumumab 06/23/2016  Cycle 7 FOLFIRI/panitumumab 07/07/2016  Cycle 8 FOLFIRI/panitumumab 07/21/2016  Cycle 9 FOLFIRI/PANITUMUMAB 08/04/2016  Cycle 10 FOLFIRI/panitumumab 08/18/2016  2.History of aBowel obstruction secondary to #1  3. Chronic back pain  4. Essential thrombocytosis  5. Early obstruction of the left ureter noted at the time of surgery 03/03/2016-Dr. Tannenbaum placed a left double-J stent 03/18/2016  6. Abdominal wall cellulitis 03/10/2016, blood cultures positive for coagulase negative staphylococcus-methicillin-resistant, status post treatment with vancomycin and Bactrim  7. Tachycardia-persistent following discharge from the hospital, likely related to anemia and deconditioning  CT chest 04/04/2016-negative for pulmonary embolism.  8. Port-A-Cath placement 04/10/2016, interventional radiology  9. Iron deficiency anemia. Feraheme 11/05/2015 , 11/12/2015,and 04/14/2016-improved  10. Pain/tenderness left posterior iliac region-resolved  11. Hypokalemia-started on potassium replacement 06/09/2016  12. Skin rash secondary to PANITUMUMAB.   Disposition:  Carolyn Barton appears well. She continues to tolerate the chemotherapy well. She will complete another cycle of FOLFIRI/panitumumab today and then undergo a restaging CT evaluation. We discussed future treatment options. She may be a candidate for resection of the primary tumor and reversal of the colostomy, but this would not be curative. She will see Dr. Brantley Stage on 09/05/2016.  The plan is to switch to a maintenance regimen with panitumumab and Xeloda if the CT reveals stable or improved disease.  She will return for an office visit on 09/01/2016.  [name] Coder, MD  08/18/2016  11:03 AM

## 2016-08-18 NOTE — Patient Instructions (Signed)
[facility] Discharge Instructions for Patients Receiving Chemotherapy  Today you received the following chemotherapy agents FOLFIRI/Vectibix  To help prevent nausea and vomiting after your treatment, we encourage you to take your nausea medication as directed.    If you develop nausea and vomiting that is not controlled by your nausea medication, call the clinic.   BELOW ARE SYMPTOMS THAT SHOULD BE REPORTED IMMEDIATELY:  *FEVER GREATER THAN 100.5 F  *CHILLS WITH OR WITHOUT FEVER  NAUSEA AND VOMITING THAT IS NOT CONTROLLED WITH YOUR NAUSEA MEDICATION  *UNUSUAL SHORTNESS OF BREATH  *UNUSUAL BRUISING OR BLEEDING  TENDERNESS IN MOUTH AND THROAT WITH OR WITHOUT PRESENCE OF ULCERS  *URINARY PROBLEMS  *BOWEL PROBLEMS  UNUSUAL RASH Items with * indicate a potential emergency and should be followed up as soon as possible.  Feel free to call the clinic you have any questions or concerns. The clinic phone number is [phone].  Please show the [facility] at check-in to the Emergency Department and triage nurse.

## 2016-08-19 ENCOUNTER — Other Ambulatory Visit: Payer: Self-pay | Admitting: Urology

## 2016-08-20 ENCOUNTER — Ambulatory Visit (HOSPITAL_BASED_OUTPATIENT_CLINIC_OR_DEPARTMENT_OTHER): Payer: BLUE CROSS/BLUE SHIELD

## 2016-08-20 VITALS — BP 114/72 | HR 95 | Temp 98.1°F | Resp 16

## 2016-08-20 DIAGNOSIS — C786 Secondary malignant neoplasm of retroperitoneum and peritoneum: Secondary | ICD-10-CM | POA: Diagnosis not present

## 2016-08-20 DIAGNOSIS — C20 Malignant neoplasm of rectum: Secondary | ICD-10-CM

## 2016-08-20 DIAGNOSIS — C787 Secondary malignant neoplasm of liver and intrahepatic bile duct: Secondary | ICD-10-CM | POA: Diagnosis not present

## 2016-08-20 MED ORDER — SODIUM CHLORIDE 0.9% FLUSH
10.0000 mL | INTRAVENOUS | Status: DC | PRN
  Administered 2016-08-20: 10 mL
  Filled 2016-08-20: qty 10

## 2016-08-20 MED ORDER — HEPARIN SOD (PORK) LOCK FLUSH 100 UNIT/ML IV SOLN
500.0000 [IU] | Freq: Once | INTRAVENOUS | Status: AC | PRN
  Administered 2016-08-20: 500 [IU]
  Filled 2016-08-20: qty 5

## 2016-08-28 ENCOUNTER — Telehealth: Payer: Self-pay | Admitting: *Deleted

## 2016-08-28 NOTE — Telephone Encounter (Signed)
Message from pt requesting flush appt prior to CT on 12/1. Called radiology, confirmed there will be someone available to access pt's port for scan.  Left message instructing pt to make radiology staff aware she needs port accessed for CT.

## 2016-08-29 ENCOUNTER — Ambulatory Visit (HOSPITAL_COMMUNITY): Payer: BLUE CROSS/BLUE SHIELD

## 2016-08-29 ENCOUNTER — Ambulatory Visit (HOSPITAL_COMMUNITY)
Admission: RE | Admit: 2016-08-29 | Discharge: 2016-08-29 | Disposition: A | Discharge status: Home / Self Care | Payer: BLUE CROSS/BLUE SHIELD | Source: Ambulatory Visit | Attending: Nurse Practitioner | Admitting: Nurse Practitioner

## 2016-08-29 DIAGNOSIS — K769 Liver disease, unspecified: Secondary | ICD-10-CM | POA: Insufficient documentation

## 2016-08-29 DIAGNOSIS — C785 Secondary malignant neoplasm of large intestine and rectum: Secondary | ICD-10-CM | POA: Insufficient documentation

## 2016-08-29 DIAGNOSIS — Q648 Other specified congenital malformations of urinary system: Secondary | ICD-10-CM | POA: Insufficient documentation

## 2016-08-29 DIAGNOSIS — C19 Malignant neoplasm of rectosigmoid junction: Secondary | ICD-10-CM

## 2016-08-29 MED ORDER — IOPAMIDOL (ISOVUE-300) INJECTION 61%
100.0000 mL | Freq: Once | INTRAVENOUS | Status: AC | PRN
Start: 2016-08-29 — End: 2016-08-29
  Administered 2016-08-29: 80 mL via INTRAVENOUS

## 2016-08-29 MED ORDER — IOPAMIDOL (ISOVUE-300) INJECTION 61%
INTRAVENOUS | Status: AC
  Filled 2016-08-29: qty 30

## 2016-08-29 MED ORDER — IOPAMIDOL (ISOVUE-300) INJECTION 61%
30.0000 mL | Freq: Once | INTRAVENOUS | Status: AC | PRN
  Administered 2016-08-29: 30 mL via ORAL

## 2016-08-31 ENCOUNTER — Other Ambulatory Visit: Payer: Self-pay | Admitting: Oncology

## 2016-09-01 ENCOUNTER — Telehealth: Payer: Self-pay | Admitting: Hematology and Oncology

## 2016-09-01 ENCOUNTER — Ambulatory Visit: Payer: BLUE CROSS/BLUE SHIELD

## 2016-09-01 ENCOUNTER — Other Ambulatory Visit (HOSPITAL_BASED_OUTPATIENT_CLINIC_OR_DEPARTMENT_OTHER): Payer: BLUE CROSS/BLUE SHIELD

## 2016-09-01 ENCOUNTER — Ambulatory Visit (HOSPITAL_BASED_OUTPATIENT_CLINIC_OR_DEPARTMENT_OTHER): Payer: BLUE CROSS/BLUE SHIELD

## 2016-09-01 ENCOUNTER — Telehealth: Payer: Self-pay | Admitting: *Deleted

## 2016-09-01 ENCOUNTER — Encounter: Payer: Self-pay | Admitting: *Deleted

## 2016-09-01 ENCOUNTER — Ambulatory Visit (HOSPITAL_BASED_OUTPATIENT_CLINIC_OR_DEPARTMENT_OTHER): Payer: BLUE CROSS/BLUE SHIELD | Admitting: Nurse Practitioner

## 2016-09-01 ENCOUNTER — Telehealth: Payer: Self-pay | Admitting: Oncology

## 2016-09-01 VITALS — BP 127/79 | HR 102 | Temp 98.2°F | Resp 18 | Ht 65.0 in | Wt 117.3 lb

## 2016-09-01 DIAGNOSIS — C19 Malignant neoplasm of rectosigmoid junction: Secondary | ICD-10-CM

## 2016-09-01 DIAGNOSIS — Z5112 Encounter for antineoplastic immunotherapy: Secondary | ICD-10-CM

## 2016-09-01 DIAGNOSIS — C787 Secondary malignant neoplasm of liver and intrahepatic bile duct: Secondary | ICD-10-CM

## 2016-09-01 DIAGNOSIS — C786 Secondary malignant neoplasm of retroperitoneum and peritoneum: Secondary | ICD-10-CM

## 2016-09-01 DIAGNOSIS — C20 Malignant neoplasm of rectum: Secondary | ICD-10-CM

## 2016-09-01 DIAGNOSIS — L270 Generalized skin eruption due to drugs and medicaments taken internally: Secondary | ICD-10-CM

## 2016-09-01 DIAGNOSIS — D473 Essential (hemorrhagic) thrombocythemia: Secondary | ICD-10-CM | POA: Diagnosis not present

## 2016-09-01 DIAGNOSIS — M549 Dorsalgia, unspecified: Secondary | ICD-10-CM

## 2016-09-01 DIAGNOSIS — Z5111 Encounter for antineoplastic chemotherapy: Secondary | ICD-10-CM

## 2016-09-01 DIAGNOSIS — D509 Iron deficiency anemia, unspecified: Secondary | ICD-10-CM

## 2016-09-01 LAB — COMPREHENSIVE METABOLIC PANEL
ALBUMIN: 2.6 g/dL — AB (ref 3.5–5.0)
ALT: 12 U/L (ref 0–55)
AST: 16 U/L (ref 5–34)
Alkaline Phosphatase: 138 U/L (ref 40–150)
Anion Gap: 8 mEq/L (ref 3–11)
BUN: 11 mg/dL (ref 7.0–26.0)
CALCIUM: 8.7 mg/dL (ref 8.4–10.4)
CHLORIDE: 106 meq/L (ref 98–109)
CO2: 25 mEq/L (ref 22–29)
CREATININE: 0.5 mg/dL — AB (ref 0.6–1.1)
EGFR: >90 mL/min/{1.73_m2} (ref >90)
GLUCOSE: 86 mg/dL (ref 70–140)
POTASSIUM: 3.5 meq/L (ref 3.5–5.1)
SODIUM: 139 meq/L (ref 136–145)
Total Bilirubin: 0.22 mg/dL (ref 0.20–1.20)
Total Protein: 5.5 g/dL — ABNORMAL LOW (ref 6.4–8.3)

## 2016-09-01 LAB — CBC WITH DIFFERENTIAL/PLATELET
BASO%: 1 % (ref 0.0–2.0)
BASOS ABS: 0.1 10*3/uL (ref 0.0–0.1)
EOS%: 7.4 % — AB (ref 0.0–7.0)
Eosinophils Absolute: 0.4 10*3/uL (ref 0.0–0.5)
HEMATOCRIT: 32.3 % — AB (ref 34.8–46.6)
HEMOGLOBIN: 10.2 g/dL — AB (ref 11.6–15.9)
LYMPH#: 1.1 10*3/uL (ref 0.9–3.3)
LYMPH%: 18.7 % (ref 14.0–49.7)
MCH: 25.6 pg (ref 25.1–34.0)
MCHC: 31.4 g/dL — AB (ref 31.5–36.0)
MCV: 81.4 fL (ref 79.5–101.0)
MONO#: 0.6 10*3/uL (ref 0.1–0.9)
MONO%: 10.5 % (ref 0.0–14.0)
NEUT#: 3.5 10*3/uL (ref 1.5–6.5)
NEUT%: 62.4 % (ref 38.4–76.8)
Platelets: 413 10*3/uL — ABNORMAL HIGH (ref 145–400)
RBC: 3.97 10*6/uL (ref 3.70–5.45)
RDW: 18.6 % — AB (ref 11.2–14.5)
WBC: 5.7 10*3/uL (ref 3.9–10.3)

## 2016-09-01 LAB — MAGNESIUM: Magnesium: 1.8 mg/dl (ref 1.5–2.5)

## 2016-09-01 LAB — CEA (IN HOUSE-CHCC): CEA (CHCC-In House): 1.02 ng/mL (ref 0.00–5.00)

## 2016-09-01 MED ORDER — HEPARIN SOD (PORK) LOCK FLUSH 100 UNIT/ML IV SOLN
500.0000 [IU] | Freq: Once | INTRAVENOUS | Status: DC | PRN
  Filled 2016-09-01: qty 5

## 2016-09-01 MED ORDER — SODIUM CHLORIDE 0.9 % IV SOLN
300.0000 mg | Freq: Once | INTRAVENOUS | Status: AC
  Administered 2016-09-01: 300 mg via INTRAVENOUS
  Filled 2016-09-01: qty 15

## 2016-09-01 MED ORDER — LEUCOVORIN CALCIUM INJECTION 350 MG
400.0000 mg/m2 | Freq: Once | INTRAVENOUS | Status: AC
  Administered 2016-09-01: 644 mg via INTRAVENOUS
  Filled 2016-09-01: qty 32.2

## 2016-09-01 MED ORDER — SODIUM CHLORIDE 0.9 % IJ SOLN
10.0000 mL | INTRAMUSCULAR | Status: DC | PRN
  Administered 2016-09-01: 10 mL via INTRAVENOUS
  Filled 2016-09-01: qty 10

## 2016-09-01 MED ORDER — FLUOROURACIL CHEMO INJECTION 2.5 GM/50ML
400.0000 mg/m2 | Freq: Once | INTRAVENOUS | Status: AC
  Administered 2016-09-01: 650 mg via INTRAVENOUS
  Filled 2016-09-01: qty 13

## 2016-09-01 MED ORDER — SODIUM CHLORIDE 0.9 % IV SOLN
Freq: Once | INTRAVENOUS | Status: AC
  Administered 2016-09-01: 11:00:00 via INTRAVENOUS

## 2016-09-01 MED ORDER — SODIUM CHLORIDE 0.9 % IV SOLN
2400.0000 mg/m2 | INTRAVENOUS | Status: DC
  Administered 2016-09-01: 3850 mg via INTRAVENOUS
  Filled 2016-09-01: qty 77

## 2016-09-01 NOTE — Progress Notes (Signed)
Oncology Nurse Navigator Documentation  Oncology Nurse Navigator Flowsheets 09/01/2016  Navigator Location [facility]  Referral date to RadOnc/MedOnc -  Navigator Encounter Type Treatment  Telephone -  Abnormal Finding Date -  Confirmed Diagnosis Date -  Surgery Date -  Treatment Initiated Date -  Patient Visit Type MedOnc  Treatment Phase Active Tx--5FU/Panitumumab---will transition next cycle to [employer] Needs Education  Education Symptom Management;Other--location of [facility] and process of ordering oral chemo  Interventions Education  Referrals -  Coordination of Care -  Education Method Verbal;Written--provided map to demonstrate location of pharmacy  Support Groups/Services -  Acuity -  Time Spent with Patient 15

## 2016-09-01 NOTE — Telephone Encounter (Signed)
Per LOS I have scheduled appts and notified the scheduler 

## 2016-09-01 NOTE — Patient Instructions (Signed)

## 2016-09-01 NOTE — Progress Notes (Addendum)
[place] OFFICE PROGRESS NOTE   Diagnosis:  Colon cancer  INTERVAL HISTORY:   Carolyn Barton returns as scheduled. She completed cycle 10 FOLFIRI/PANITUMUMAB 08/18/2016. She denies nausea/vomiting. No mouth sores. No diarrhea. Rash overall is better. She denies abdominal pain. She continues to note skin dryness and areas of cracking mainly on the right hand.  Objective:  Vital signs in last 24 hours:  Blood pressure 127/79, pulse (!) 102, temperature 98.2 F (36.8 C), temperature source Oral, resp. rate 18, height '5\' 5"'$  (1.651 m), weight 117 lb 4.8 oz (53.2 kg), SpO2 100 %.    HEENT: No thrush or ulcers. Resp: Lungs clear bilaterally. Cardio: Regular rate and rhythm. GI: Abdomen soft and nontender. No hepatomegaly. Left lower quadrant colostomy. Small area of superficial scabbing midline scar. Vascular: Pitting edema at the lower legs bilaterally. Skin: In general skin has a dry appearance. Right hand with a few areas of linear ulceration.  Port-A-Cath without erythema.  Lab Results:  Lab Results  Component Value Date   WBC 5.7 09/01/2016   HGB 10.2 (L) 09/01/2016   HCT 32.3 (L) 09/01/2016   MCV 81.4 09/01/2016   PLT 413 (H) 09/01/2016   NEUTROABS 3.5 09/01/2016    Imaging:  No results found.  Medications: I have reviewed the patient's current medications.  Assessment/Plan: 1. Metastatic colorectal cancer-status post an exploratory laparotomy 03/03/2016 revealing a proximal rectal mass, peritoneal carcinomatosis, and liver metastases  Biopsy of peritoneal nodules 03/03/2016 confirmed metastatic adenocarcinoma consistent with a colon primary  Foundation 1 testing-MSI-stable, tumor mutation burden-low, no BRAF NRAS or KRAS mutation  Cycle 1 FOLFIRI/PANITUMUMAB 04/14/2016  Cycle 2 FOLFIRI/panitumumab 04/28/2016  Cycle 3 FOLFIRI/panitumumab 05/12/2016  Cycle 4 FOLFIRI/panitumumab 05/27/2016  Cycle 5 FOLFIRI/panitumumab 06/09/2016  Restaging CT  abdomen/pelvis 06/20/2016-no evidence of disease progression, decreased left hepatic lesion  Cycle 6 FOLFIRI/panitumumab 06/23/2016  Cycle 7 FOLFIRI/panitumumab 07/07/2016  Cycle 8 FOLFIRI/panitumumab 07/21/2016  Cycle 9 FOLFIRI/PANITUMUMAB 08/04/2016  Cycle 10 FOLFIRI/panitumumab 08/18/2016  Restaging CT 08/29/2016 with interval decrease in the size of the medial segment left liver lesion. Lesion identified previously in the rectosigmoid colon not evident on the current study.  5-FU/PANITUMUMAB 09/01/2016  2.History of aBowel obstruction secondary to #1  3. Chronic back pain  4. Essential thrombocytosis  5. Early obstruction of the left ureter noted at the time of surgery 03/03/2016-Dr. Tannenbaum placed a left double-J stent 03/18/2016  6. Abdominal wall cellulitis 03/10/2016, blood cultures positive for coagulase negative staphylococcus-methicillin-resistant, status post treatment with vancomycin and Bactrim  7. Tachycardia-persistent following discharge from the hospital, likely related to anemia and deconditioning  CT chest 04/04/2016-negative for pulmonary embolism.  8. Port-A-Cath placement 04/10/2016, interventional radiology  9. Iron deficiency anemia. Feraheme 11/05/2015 , 11/12/2015,and 04/14/2016-improved  10. Pain/tenderness left posterior iliac region-resolved  11. Hypokalemia-started on potassium replacement 06/09/2016  12. Skin rash secondary to PANITUMUMAB.   Disposition: [name] appears stable. She has completed 7 cycles of FOLFIRI/PANITUMUMAB. Recent restaging CT evaluation shows further improvement. Dr. Benay Spice recommends beginning "maintenance" chemotherapy with PANITUMUMAB every 3 weeks and Xeloda 7 days on/7 days off. We reviewed potential toxicities associated with Xeloda including mouth sores, skin rash, hand-foot syndrome. She is agreeable with this plan. Today she will receive 5-FU as per the FOLFIRI regimen, no irinotecan,  and PANITUMUMAB. She will return for a follow-up visit in 2 weeks for PANITUMUMAB with plans to begin Xeloda that day. She will contact the office in the interim with any problems.  She has a follow-up appointment with  Dr. Brantley Stage later this week.  Patient seen with Dr. Benay Spice. 25 minutes were spent face-to-face at today's visit with the majority of that time involved in counseling/coordination of care.    Ned Card ANP/GNP-BC   09/01/2016  9:07 AM This was a shared visit with Ned Card. We reviewed the restaging CT findings with Carolyn Barton and her husband. She will begin maintenance therapy with Xeloda/panitumumab when she returns in 2 weeks. We reviewed the potential toxicities associated with Xeloda and she agrees to proceed.  Carolyn Barton will see Dr. Brantley Stage later this week to discuss the possibility of removing the primary tumor and reversing the colostomy.  [name], M.D.

## 2016-09-01 NOTE — Patient Instructions (Signed)
[facility] [place] Discharge Instructions for Patients Receiving Chemotherapy  Today you received the following chemotherapy agents Vectibix, Leucovorin and Adrucil.  To help prevent nausea and vomiting after your treatment, we encourage you to take your nausea medication as directed.   If you develop nausea and vomiting that is not controlled by your nausea medication, call the clinic.   BELOW ARE SYMPTOMS THAT SHOULD BE REPORTED IMMEDIATELY:  *FEVER GREATER THAN 100.5 F  *CHILLS WITH OR WITHOUT FEVER  NAUSEA AND VOMITING THAT IS NOT CONTROLLED WITH YOUR NAUSEA MEDICATION  *UNUSUAL SHORTNESS OF BREATH  *UNUSUAL BRUISING OR BLEEDING  TENDERNESS IN MOUTH AND THROAT WITH OR WITHOUT PRESENCE OF ULCERS  *URINARY PROBLEMS  *BOWEL PROBLEMS  UNUSUAL RASH Items with * indicate a potential emergency and should be followed up as soon as possible.  Feel free to call the clinic you have any questions or concerns. The clinic phone number is [phone].  Please show the [place] at check-in to the Emergency Department and triage nurse.

## 2016-09-01 NOTE — Telephone Encounter (Signed)
Message sent to chemo scheduler to be added per 09/01/16 los. Appointments schedule per 09/01/16 los. A copy of the AVS report and appointment schedule was given to the patient, per 09/01/16 los.

## 2016-09-01 NOTE — Telephone Encounter (Signed)
Message sent to chemo scheduler to be added, per 09/01/16 los. Appointments scheduled per 09/01/16 los. A copy of the AVS report and appointment schedule was given to the patient, per 09/01/16 los. ° °

## 2016-09-03 ENCOUNTER — Ambulatory Visit (HOSPITAL_BASED_OUTPATIENT_CLINIC_OR_DEPARTMENT_OTHER): Payer: BLUE CROSS/BLUE SHIELD

## 2016-09-03 VITALS — BP 128/79 | HR 96 | Temp 98.0°F | Resp 20

## 2016-09-03 DIAGNOSIS — C20 Malignant neoplasm of rectum: Secondary | ICD-10-CM

## 2016-09-03 DIAGNOSIS — C787 Secondary malignant neoplasm of liver and intrahepatic bile duct: Secondary | ICD-10-CM

## 2016-09-03 DIAGNOSIS — C786 Secondary malignant neoplasm of retroperitoneum and peritoneum: Secondary | ICD-10-CM | POA: Diagnosis not present

## 2016-09-03 MED ORDER — SODIUM CHLORIDE 0.9% FLUSH
10.0000 mL | INTRAVENOUS | Status: DC | PRN
  Administered 2016-09-03: 10 mL
  Filled 2016-09-03: qty 10

## 2016-09-03 MED ORDER — HEPARIN SOD (PORK) LOCK FLUSH 100 UNIT/ML IV SOLN
500.0000 [IU] | Freq: Once | INTRAVENOUS | Status: AC | PRN
  Administered 2016-09-03: 500 [IU]
  Filled 2016-09-03: qty 5

## 2016-09-04 ENCOUNTER — Telehealth: Payer: Self-pay | Admitting: Pharmacist

## 2016-09-04 ENCOUNTER — Other Ambulatory Visit: Payer: Self-pay | Admitting: *Deleted

## 2016-09-04 MED ORDER — CAPECITABINE 500 MG PO TABS: ORAL_TABLET | ORAL | 0 refills | Status: DC

## 2016-09-04 MED ORDER — CAPECITABINE 500 MG PO TABS: 500.0000 mg | ORAL_TABLET | Freq: Two times a day (BID) | ORAL | 0 refills | Status: DC

## 2016-09-04 NOTE — Telephone Encounter (Signed)
Received new prescription for Xeloda. Faxed to [facility] outpatient pharmacy for benefits analysis. Fax confirmation received.  Labs reviewed - ok for treatment. Drug interactions reviewed with current medication list in Epic. 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 Alerted Dr. Benay Spice.  Will continue to follow.  Raul Del, PharmD, BCPS, BCOP Oral Chemotherapy Clinic [phone]

## 2016-09-04 NOTE — Telephone Encounter (Signed)
Received communication from [place] that her Xeloda rx requires specialty pharmacy. Notified Dr. Gearldine Shown RN to escribe rx to [facility].  Raul Del, PharmD, BCPS, BCOP Oral Chemotherapy Clinic [phone]

## 2016-09-10 ENCOUNTER — Telehealth: Payer: Self-pay | Admitting: *Deleted

## 2016-09-10 ENCOUNTER — Other Ambulatory Visit: Payer: Self-pay | Admitting: *Deleted

## 2016-09-10 MED ORDER — CAPECITABINE 500 MG PO TABS: ORAL_TABLET | ORAL | 0 refills | Status: DC

## 2016-09-10 NOTE — Telephone Encounter (Signed)
Left VM to inquire if she has received her Xeloda yet and if not, has she heard from the company? Left contact # for nurse navigator and also left phone # for [facility] for her to call if necessary.

## 2016-09-10 NOTE — Telephone Encounter (Signed)
Patient returned call to report that [facility] has no record of receipt of the Xeloda script. Called pharmacy and confirmed this. Attempted to call in script, but pharmacist was not available to accept the prescription. E-scribed the script again.

## 2016-09-10 NOTE — Telephone Encounter (Addendum)
Called pharmacy and confirmed that the script has been received and is in the insurance verification stage now. Should be processed by Friday. Was informed they can overnight ship the medication, so she should have it by Monday. Left VM for patient w/this information and navigator will follow up on Friday.

## 2016-09-12 ENCOUNTER — Telehealth: Payer: Self-pay | Admitting: Pharmacist

## 2016-09-12 ENCOUNTER — Telehealth: Payer: Self-pay | Admitting: *Deleted

## 2016-09-12 NOTE — Telephone Encounter (Signed)
Called pharmacy to f/u on status of her Xeloda. According to representative it needs a prior authorization and this was sent to office. He is unable to tell RN what fax # it was sent to. Instructed him to refax to navigator fax [phone] and it will be given to pharmacist. Patient is supposed to start this on Monday.

## 2016-09-12 NOTE — Telephone Encounter (Signed)
Oral Chemotherapy Pharmacist Encounter  Received notification from [facility] that Xeloda would require prior authorization. PA submitted on covermymeds.com Key [id number], status pending, may take 24-72 hours for determination.  This encounter will continue to be updated until final determination.  Oral Oncology Clinic will continue to follow.  Johny Drilling, PharmD, BCPS, BCOP 09/12/2016  5:06 PM Oral Oncology Clinic [phone]

## 2016-09-14 ENCOUNTER — Other Ambulatory Visit: Payer: Self-pay | Admitting: Oncology

## 2016-09-15 ENCOUNTER — Other Ambulatory Visit (HOSPITAL_BASED_OUTPATIENT_CLINIC_OR_DEPARTMENT_OTHER): Payer: BLUE CROSS/BLUE SHIELD

## 2016-09-15 ENCOUNTER — Encounter: Payer: BLUE CROSS/BLUE SHIELD | Admitting: Nutrition

## 2016-09-15 ENCOUNTER — Ambulatory Visit (HOSPITAL_BASED_OUTPATIENT_CLINIC_OR_DEPARTMENT_OTHER): Payer: BLUE CROSS/BLUE SHIELD

## 2016-09-15 ENCOUNTER — Ambulatory Visit: Payer: BLUE CROSS/BLUE SHIELD

## 2016-09-15 ENCOUNTER — Ambulatory Visit (HOSPITAL_BASED_OUTPATIENT_CLINIC_OR_DEPARTMENT_OTHER): Payer: BLUE CROSS/BLUE SHIELD | Admitting: Nurse Practitioner

## 2016-09-15 ENCOUNTER — Encounter: Payer: Self-pay | Admitting: Nutrition

## 2016-09-15 ENCOUNTER — Telehealth: Payer: Self-pay | Admitting: *Deleted

## 2016-09-15 ENCOUNTER — Other Ambulatory Visit: Payer: Self-pay | Admitting: Nurse Practitioner

## 2016-09-15 DIAGNOSIS — C20 Malignant neoplasm of rectum: Secondary | ICD-10-CM

## 2016-09-15 DIAGNOSIS — Z5112 Encounter for antineoplastic immunotherapy: Secondary | ICD-10-CM | POA: Diagnosis not present

## 2016-09-15 DIAGNOSIS — D509 Iron deficiency anemia, unspecified: Secondary | ICD-10-CM

## 2016-09-15 DIAGNOSIS — R238 Other skin changes: Secondary | ICD-10-CM

## 2016-09-15 DIAGNOSIS — C786 Secondary malignant neoplasm of retroperitoneum and peritoneum: Secondary | ICD-10-CM

## 2016-09-15 DIAGNOSIS — C19 Malignant neoplasm of rectosigmoid junction: Secondary | ICD-10-CM

## 2016-09-15 DIAGNOSIS — C787 Secondary malignant neoplasm of liver and intrahepatic bile duct: Secondary | ICD-10-CM | POA: Diagnosis not present

## 2016-09-15 DIAGNOSIS — M549 Dorsalgia, unspecified: Secondary | ICD-10-CM

## 2016-09-15 DIAGNOSIS — D473 Essential (hemorrhagic) thrombocythemia: Secondary | ICD-10-CM

## 2016-09-15 LAB — CBC WITH DIFFERENTIAL/PLATELET
BASO%: 1.3 % (ref 0.0–2.0)
BASOS ABS: 0.1 10*3/uL (ref 0.0–0.1)
EOS ABS: 0.3 10*3/uL (ref 0.0–0.5)
EOS%: 4.7 % (ref 0.0–7.0)
HEMATOCRIT: 36.2 % (ref 34.8–46.6)
HEMOGLOBIN: 11.4 g/dL — AB (ref 11.6–15.9)
LYMPH#: 1.3 10*3/uL (ref 0.9–3.3)
LYMPH%: 17.9 % (ref 14.0–49.7)
MCH: 25.8 pg (ref 25.1–34.0)
MCHC: 31.4 g/dL — ABNORMAL LOW (ref 31.5–36.0)
MCV: 82.2 fL (ref 79.5–101.0)
MONO#: 1 10*3/uL — ABNORMAL HIGH (ref 0.1–0.9)
MONO%: 14.4 % — ABNORMAL HIGH (ref 0.0–14.0)
NEUT#: 4.3 10*3/uL (ref 1.5–6.5)
NEUT%: 61.7 % (ref 38.4–76.8)
Platelets: 403 10*3/uL — ABNORMAL HIGH (ref 145–400)
RBC: 4.41 10*6/uL (ref 3.70–5.45)
RDW: 19 % — AB (ref 11.2–14.5)
WBC: 7 10*3/uL (ref 3.9–10.3)

## 2016-09-15 LAB — COMPREHENSIVE METABOLIC PANEL
ALBUMIN: 2.5 g/dL — AB (ref 3.5–5.0)
ALK PHOS: 145 U/L (ref 40–150)
ALT: 11 U/L (ref 0–55)
AST: 16 U/L (ref 5–34)
Anion Gap: 8 mEq/L (ref 3–11)
BILIRUBIN TOTAL: 0.3 mg/dL (ref 0.20–1.20)
BUN: 13.2 mg/dL (ref 7.0–26.0)
CALCIUM: 9.2 mg/dL (ref 8.4–10.4)
CO2: 25 mEq/L (ref 22–29)
CREATININE: 0.5 mg/dL — AB (ref 0.6–1.1)
Chloride: 107 mEq/L (ref 98–109)
EGFR: >90 mL/min/{1.73_m2} (ref >90)
Glucose: 78 mg/dl (ref 70–140)
POTASSIUM: 3.4 meq/L — AB (ref 3.5–5.1)
Sodium: 140 mEq/L (ref 136–145)
Total Protein: 5.6 g/dL — ABNORMAL LOW (ref 6.4–8.3)

## 2016-09-15 LAB — CEA (IN HOUSE-CHCC): CEA (CHCC-In House): <1 ng/mL (ref 0.00–5.00)

## 2016-09-15 LAB — MAGNESIUM: Magnesium: 1.8 mg/dl (ref 1.5–2.5)

## 2016-09-15 MED ORDER — SODIUM CHLORIDE 0.9 % IV SOLN
Freq: Once | INTRAVENOUS | Status: AC
  Administered 2016-09-15: 10:00:00 via INTRAVENOUS

## 2016-09-15 MED ORDER — SODIUM CHLORIDE 0.9 % IV SOLN
5.8000 mg/kg | Freq: Once | INTRAVENOUS | Status: AC
  Administered 2016-09-15: 300 mg via INTRAVENOUS
  Filled 2016-09-15: qty 15

## 2016-09-15 MED ORDER — SODIUM CHLORIDE 0.9 % IJ SOLN
10.0000 mL | INTRAMUSCULAR | Status: DC | PRN
  Administered 2016-09-15: 10 mL
  Filled 2016-09-15: qty 10

## 2016-09-15 MED ORDER — PROCHLORPERAZINE MALEATE 10 MG PO TABS: 10.0000 mg | ORAL_TABLET | Freq: Four times a day (QID) | ORAL | 1 refills | Status: DC | PRN

## 2016-09-15 MED ORDER — HEPARIN SOD (PORK) LOCK FLUSH 100 UNIT/ML IV SOLN
500.0000 [IU] | Freq: Once | INTRAVENOUS | Status: AC | PRN
  Administered 2016-09-15: 500 [IU]
  Filled 2016-09-15: qty 5

## 2016-09-15 MED ORDER — SODIUM CHLORIDE 0.9% FLUSH
10.0000 mL | INTRAVENOUS | Status: DC | PRN
  Administered 2016-09-15: 10 mL
  Filled 2016-09-15: qty 10

## 2016-09-15 NOTE — Patient Instructions (Signed)

## 2016-09-15 NOTE — Progress Notes (Signed)
[facility] OFFICE PROGRESS NOTE   Diagnosis:  Colon cancer  INTERVAL HISTORY:   Carolyn Barton returns as scheduled. She completed a cycle of 5-fluorouracil and PANITUMUMAB 09/01/2016. She denies nausea/vomiting. No mouth sores. She had loose stools last night. She thinks this was diarrhea related. Skin remains very dry. She notes "cracks" at the heels bilaterally with associated pain.  Objective:  Vital signs in last 24 hours:  Blood pressure 134/83, pulse (!) 106, temperature 98.3 F (36.8 C), temperature source Oral, resp. rate 17, height '5\' 5"'$  (1.651 m), weight 114 lb 4.8 oz (51.8 kg), SpO2 100 %.    HEENT: No thrush or ulcers. Resp: Lungs clear bilaterally. Cardio: Regular rate and rhythm. GI: Abdomen soft and nontender. No hepatomegaly. Left lower quadrant colostomy. Vascular: Pitting edema at the lower legs bilaterally. Skin: Skin is dry appearing. Right palm is dry appearing. A few areas of linear ulceration. Similar appearance over the heels. Crusted lesion parietal scalp. Port-A-Cath without erythema.   Lab Results:  Lab Results  Component Value Date   WBC 5.7 09/01/2016   HGB 10.2 (L) 09/01/2016   HCT 32.3 (L) 09/01/2016   MCV 81.4 09/01/2016   PLT 413 (H) 09/01/2016   NEUTROABS 3.5 09/01/2016    Imaging:  No results found.  Medications: I have reviewed the patient's current medications.  Assessment/Plan: 1. Metastatic colorectal cancer-status post an exploratory laparotomy 03/03/2016 revealing a proximal rectal mass, peritoneal carcinomatosis, and liver metastases  Biopsy of peritoneal nodules 03/03/2016 confirmed metastatic adenocarcinoma consistent with a colon primary  Foundation 1 testing-MSI-Barton, tumor mutation burden-low, no BRAF NRAS or KRAS mutation  Cycle 1 FOLFIRI/PANITUMUMAB 04/14/2016  Cycle 2 FOLFIRI/panitumumab 04/28/2016  Cycle 3 FOLFIRI/panitumumab 05/12/2016  Cycle 4 FOLFIRI/panitumumab 05/27/2016  Cycle 5  FOLFIRI/panitumumab 06/09/2016  Restaging CT abdomen/pelvis 06/20/2016-no evidence of disease progression, decreased left hepatic lesion  Cycle 6 FOLFIRI/panitumumab 06/23/2016  Cycle 7 FOLFIRI/panitumumab 07/07/2016  Cycle 8 FOLFIRI/panitumumab 07/21/2016  Cycle 9 FOLFIRI/PANITUMUMAB 08/04/2016  Cycle 10 FOLFIRI/panitumumab 08/18/2016  Restaging CT 08/29/2016 with interval decrease in the size of the medial segment left liver lesion. Lesion identified previously in the rectosigmoid colon not evident on the current study.  5-FU/PANITUMUMAB 09/01/2016  Xeloda 7 days on/7 days off and PANITUMUMAB every 3 weeks 09/15/2016 (awaiting insurance approval for Xeloda 09/15/2016)  2.History of aBowel obstruction secondary to #1  3. Chronic back pain  4. Essential thrombocytosis  5. Early obstruction of the left ureter noted at the time of surgery 03/03/2016-Dr. Tannenbaum placed a left double-J stent 03/18/2016  6. Abdominal wall cellulitis 03/10/2016, blood cultures positive for coagulase negative staphylococcus-methicillin-resistant, status post treatment with vancomycin and Bactrim  7. Tachycardia-persistent following discharge from the hospital, likely related to anemia and deconditioning  CT chest 04/04/2016-negative for pulmonary embolism.  8. Port-A-Cath placement 04/10/2016, interventional radiology  9. Iron deficiency anemia. Feraheme 11/05/2015 , 11/12/2015,and 04/14/2016-improved  10. Pain/tenderness left posterior iliac region-resolved  11. Hypokalemia-started on potassium replacement 06/09/2016  12. Skin rash secondary to PANITUMUMAB.   [name]. Plan to proceed with PANITUMUMAB today as scheduled. We are awaiting approval from her insurance company for the [place]. She will hopefully be able to begin Xeloda 7 days on/7 days off later this week.  The skin changes over the right palm and bilateral heels are related to  PANITUMUMAB. She will continue symptomatic care.  She will return for a follow-up visit in 3 weeks. She will contact the office in the interim with any problems.  Ned Card ANP/GNP-BC   09/15/2016  9:05 AM

## 2016-09-15 NOTE — Patient Instructions (Signed)
[facility] Discharge Instructions for Patients Receiving Chemotherapy  Today you received the following chemotherapy agents Vectibix  To help prevent nausea and vomiting after your treatment, we encourage you to take your nausea medication as directed.   If you develop nausea and vomiting that is not controlled by your nausea medication, call the clinic.   BELOW ARE SYMPTOMS THAT SHOULD BE REPORTED IMMEDIATELY:  *FEVER GREATER THAN 100.5 F  *CHILLS WITH OR WITHOUT FEVER  NAUSEA AND VOMITING THAT IS NOT CONTROLLED WITH YOUR NAUSEA MEDICATION  *UNUSUAL SHORTNESS OF BREATH  *UNUSUAL BRUISING OR BLEEDING  TENDERNESS IN MOUTH AND THROAT WITH OR WITHOUT PRESENCE OF ULCERS  *URINARY PROBLEMS  *BOWEL PROBLEMS  UNUSUAL RASH Items with * indicate a potential emergency and should be followed up as soon as possible.  Feel free to call the clinic you have any questions or concerns. The clinic phone number is [phone].  Please show the CHEMO ALERT CARD at check-in to the Emergency Department and triage nurse.   

## 2016-09-15 NOTE — Progress Notes (Signed)
I attempted to see patient at appointment time. She had already completed treatment. RN reports she is not receiving full treatment which was scheduled for today. I will try to see patient at next infusion.

## 2016-09-15 NOTE — Telephone Encounter (Signed)
Fax from [place] not received. Called pharmacy again and confirmed they are still waiting for PA from [place] office. Insurance department confirmed fax and direct phone # for navigator and will attempt to fax form again.

## 2016-09-16 NOTE — Telephone Encounter (Signed)
Oral Chemotherapy Pharmacist Encounter  I called [place] at [phone], option 4, option 1, to follow up on status of prior authorization submitted 12/15 on covermymeds.com as directed by [place]. I was informed that a new [place] specific form was required, this form was sent to [place] on 12/15 however there is no one by that name at our office.  New form requested to be faxed to [facility] at [phone].  Form completed and faxed back to [facility] at [phone].  I will call [facility] when I receive PA determination.  Oral Oncology Clinic will continue to follow.  Johny Drilling, PharmD, BCPS, BCOP 09/16/2016  10:58 AM Oral Oncology Clinic [phone]

## 2016-09-16 NOTE — Telephone Encounter (Signed)
Oral Chemotherapy Pharmacist Encounter  Received notification from [facility] that prior authorization for Xeloda has been approved Ref# [id number] Effective dates: 09/16/16-09/15/17  I called [facility] at [phone] to have them process prescription.  I called patient and left VM with above number for patient to call and schedule delivery of Xeloda.  Patient instructed to call Oral Oncology office with questions or concerns, copayment issues, also offered to perform medication counseling.  Oral Oncology Clinic will continue to follow.  Johny Drilling, PharmD, BCPS, BCOP 09/16/2016  1:44 PM Oral Oncology Clinic [phone]

## 2016-09-18 ENCOUNTER — Telehealth: Payer: Self-pay | Admitting: *Deleted

## 2016-09-18 NOTE — Telephone Encounter (Signed)
Call from [place] to get pt's current Height and Weight to calculate Capecitabine Rx.   Ht and Wt given documented on 09/15/16.

## 2016-10-02 ENCOUNTER — Encounter (HOSPITAL_BASED_OUTPATIENT_CLINIC_OR_DEPARTMENT_OTHER): Payer: Self-pay | Admitting: *Deleted

## 2016-10-03 ENCOUNTER — Other Ambulatory Visit: Payer: Self-pay | Admitting: *Deleted

## 2016-10-03 ENCOUNTER — Encounter (HOSPITAL_BASED_OUTPATIENT_CLINIC_OR_DEPARTMENT_OTHER): Payer: Self-pay | Admitting: *Deleted

## 2016-10-03 DIAGNOSIS — C20 Malignant neoplasm of rectum: Secondary | ICD-10-CM

## 2016-10-03 NOTE — Progress Notes (Signed)
NPO AFTER MN.  ARRIVE AT 0930.  CURRENT LAB RESULTS IN CHART AND EPIC.  ASK MDA IF EKG IS NEEDED, LAST CURRENT EKG HAS HR 160.  WILL TAKE METHADONE AM DOS W/ SIPS OF WATER.

## 2016-10-05 ENCOUNTER — Other Ambulatory Visit: Payer: Self-pay | Admitting: Oncology

## 2016-10-06 ENCOUNTER — Encounter: Payer: BLUE CROSS/BLUE SHIELD | Admitting: Nutrition

## 2016-10-06 ENCOUNTER — Ambulatory Visit: Payer: BLUE CROSS/BLUE SHIELD

## 2016-10-06 ENCOUNTER — Ambulatory Visit (HOSPITAL_BASED_OUTPATIENT_CLINIC_OR_DEPARTMENT_OTHER): Payer: BLUE CROSS/BLUE SHIELD | Admitting: Oncology

## 2016-10-06 ENCOUNTER — Telehealth: Payer: Self-pay | Admitting: Oncology

## 2016-10-06 ENCOUNTER — Ambulatory Visit (HOSPITAL_BASED_OUTPATIENT_CLINIC_OR_DEPARTMENT_OTHER): Payer: BLUE CROSS/BLUE SHIELD

## 2016-10-06 ENCOUNTER — Telehealth: Payer: Self-pay | Admitting: *Deleted

## 2016-10-06 ENCOUNTER — Other Ambulatory Visit (HOSPITAL_BASED_OUTPATIENT_CLINIC_OR_DEPARTMENT_OTHER): Payer: BLUE CROSS/BLUE SHIELD

## 2016-10-06 ENCOUNTER — Ambulatory Visit: Payer: BLUE CROSS/BLUE SHIELD | Admitting: Nutrition

## 2016-10-06 ENCOUNTER — Encounter: Payer: Self-pay | Admitting: *Deleted

## 2016-10-06 VITALS — BP 123/82 | HR 86 | Temp 98.1°F | Resp 16 | Ht 65.0 in | Wt 123.5 lb

## 2016-10-06 DIAGNOSIS — C19 Malignant neoplasm of rectosigmoid junction: Secondary | ICD-10-CM

## 2016-10-06 DIAGNOSIS — C787 Secondary malignant neoplasm of liver and intrahepatic bile duct: Secondary | ICD-10-CM

## 2016-10-06 DIAGNOSIS — D473 Essential (hemorrhagic) thrombocythemia: Secondary | ICD-10-CM

## 2016-10-06 DIAGNOSIS — Z452 Encounter for adjustment and management of vascular access device: Secondary | ICD-10-CM

## 2016-10-06 DIAGNOSIS — C20 Malignant neoplasm of rectum: Secondary | ICD-10-CM

## 2016-10-06 DIAGNOSIS — Z95828 Presence of other vascular implants and grafts: Secondary | ICD-10-CM

## 2016-10-06 DIAGNOSIS — L270 Generalized skin eruption due to drugs and medicaments taken internally: Secondary | ICD-10-CM

## 2016-10-06 DIAGNOSIS — C786 Secondary malignant neoplasm of retroperitoneum and peritoneum: Secondary | ICD-10-CM

## 2016-10-06 DIAGNOSIS — Z5112 Encounter for antineoplastic immunotherapy: Secondary | ICD-10-CM | POA: Diagnosis not present

## 2016-10-06 DIAGNOSIS — D509 Iron deficiency anemia, unspecified: Secondary | ICD-10-CM

## 2016-10-06 DIAGNOSIS — M545 Low back pain: Secondary | ICD-10-CM

## 2016-10-06 LAB — CBC WITH DIFFERENTIAL/PLATELET
BASO%: 0.7 % (ref 0.0–2.0)
BASOS ABS: 0 10*3/uL (ref 0.0–0.1)
EOS%: 6.6 % (ref 0.0–7.0)
Eosinophils Absolute: 0.4 10*3/uL (ref 0.0–0.5)
HEMATOCRIT: 36.4 % (ref 34.8–46.6)
HGB: 11.2 g/dL — ABNORMAL LOW (ref 11.6–15.9)
LYMPH#: 1.2 10*3/uL (ref 0.9–3.3)
LYMPH%: 19.7 % (ref 14.0–49.7)
MCH: 25.8 pg (ref 25.1–34.0)
MCHC: 30.8 g/dL — AB (ref 31.5–36.0)
MCV: 83.9 fL (ref 79.5–101.0)
MONO#: 0.6 10*3/uL (ref 0.1–0.9)
MONO%: 9.8 % (ref 0.0–14.0)
NEUT#: 3.8 10*3/uL (ref 1.5–6.5)
NEUT%: 63.2 % (ref 38.4–76.8)
PLATELETS: 544 10*3/uL — AB (ref 145–400)
RBC: 4.34 10*6/uL (ref 3.70–5.45)
RDW: 18.7 % — ABNORMAL HIGH (ref 11.2–14.5)
WBC: 5.9 10*3/uL (ref 3.9–10.3)

## 2016-10-06 LAB — COMPREHENSIVE METABOLIC PANEL
ALT: 21 U/L (ref 0–55)
ANION GAP: 6 meq/L (ref 3–11)
AST: 23 U/L (ref 5–34)
Albumin: 3.4 g/dL — ABNORMAL LOW (ref 3.5–5.0)
Alkaline Phosphatase: 170 U/L — ABNORMAL HIGH (ref 40–150)
BUN: 13.1 mg/dL (ref 7.0–26.0)
CHLORIDE: 104 meq/L (ref 98–109)
CO2: 28 mEq/L (ref 22–29)
Calcium: 9.1 mg/dL (ref 8.4–10.4)
Creatinine: 0.5 mg/dL — ABNORMAL LOW (ref 0.6–1.1)
Glucose: 78 mg/dl (ref 70–140)
POTASSIUM: 4 meq/L (ref 3.5–5.1)
Sodium: 138 mEq/L (ref 136–145)
Total Bilirubin: 0.36 mg/dL (ref 0.20–1.20)
Total Protein: 6.6 g/dL (ref 6.4–8.3)

## 2016-10-06 LAB — MAGNESIUM: Magnesium: 2 mg/dl (ref 1.5–2.5)

## 2016-10-06 MED ORDER — ALTEPLASE 2 MG IJ SOLR
2.0000 mg | INTRAMUSCULAR | Status: AC | PRN
Start: 2016-10-06 — End: ?
  Administered 2016-10-06: 2 mg
  Filled 2016-10-06: qty 2

## 2016-10-06 MED ORDER — SODIUM CHLORIDE 0.9% FLUSH
10.0000 mL | INTRAVENOUS | Status: DC | PRN
  Administered 2016-10-06: 10 mL
  Filled 2016-10-06: qty 10

## 2016-10-06 MED ORDER — SODIUM CHLORIDE 0.9 % IJ SOLN
10.0000 mL | INTRAMUSCULAR | Status: AC | PRN
Start: 2016-10-06 — End: 2016-10-06
  Administered 2016-10-06: 10 mL
  Filled 2016-10-06: qty 10

## 2016-10-06 MED ORDER — SODIUM CHLORIDE 0.9 % IV SOLN
Freq: Once | INTRAVENOUS | Status: AC
  Administered 2016-10-06: 12:00:00 via INTRAVENOUS

## 2016-10-06 MED ORDER — SODIUM CHLORIDE 0.9 % IV SOLN
5.6000 mg/kg | Freq: Once | INTRAVENOUS | Status: AC
  Administered 2016-10-06: 300 mg via INTRAVENOUS
  Filled 2016-10-06: qty 15

## 2016-10-06 MED ORDER — HEPARIN SOD (PORK) LOCK FLUSH 100 UNIT/ML IV SOLN
500.0000 [IU] | Freq: Once | INTRAVENOUS | Status: AC | PRN
  Administered 2016-10-06: 500 [IU]
  Filled 2016-10-06: qty 5

## 2016-10-06 NOTE — Patient Instructions (Signed)
[place] Discharge Instructions for Patients Receiving Chemotherapy  Today you received the following chemotherapy agents:  Vectibix.  To help prevent nausea and vomiting after your treatment, we encourage you to take your nausea medication as directed.   If you develop nausea and vomiting that is not controlled by your nausea medication, call the clinic.   BELOW ARE SYMPTOMS THAT SHOULD BE REPORTED IMMEDIATELY:  *FEVER GREATER THAN 100.5 F  *CHILLS WITH OR WITHOUT FEVER  NAUSEA AND VOMITING THAT IS NOT CONTROLLED WITH YOUR NAUSEA MEDICATION  *UNUSUAL SHORTNESS OF BREATH  *UNUSUAL BRUISING OR BLEEDING  TENDERNESS IN MOUTH AND THROAT WITH OR WITHOUT PRESENCE OF ULCERS  *URINARY PROBLEMS  *BOWEL PROBLEMS  UNUSUAL RASH Items with * indicate a potential emergency and should be followed up as soon as possible.  Feel free to call the clinic you have any questions or concerns. The clinic phone number is [phone].  Please show the [place] at check-in to the Emergency Department and triage nurse.  Panitumumab Solution for Injection What is this medicine? PANITUMUMAB (pan i TOOM ue mab) is a monoclonal antibody. It is used to treat colorectal cancer. COMMON BRAND NAME(S): Vectibix What should I tell my health care provider before I take this medicine? They need to know if you have any of these conditions: -lung disease, especially lung fibrosis -skin conditions or sensitivity -an unusual or allergic reaction to panitumumab, mouse proteins, other medicines, foods, dyes, or preservatives -pregnant or trying to get pregnant -breast-feeding How should I use this medicine? This drug is given as an infusion into a vein. It is administered in a hospital or clinic by a specially trained health care professional. Talk to your pediatrician regarding the use of this medicine in children. Special care may be needed. What if I miss a dose? It is important not  to miss your dose. Call your doctor or health care professional if you are unable to keep an appointment. What may interact with this medicine? -some medicines for cancer What should I watch for while using this medicine? Visit your doctor for checks on your progress. This drug may make you feel generally unwell. This is not uncommon, as chemotherapy can affect healthy cells as well as cancer cells. Report any side effects. Continue your course of treatment even though you feel ill unless your doctor tells you to stop. This medicine can make you more sensitive to the sun. Keep out of the sun while receiving this medicine and for 2 months after the last dose. If you cannot avoid being in the sun, wear protective clothing and use sunscreen. Do not use sun lamps or tanning beds/booths. In some cases, you may be given additional medicines to help with side effects. Follow all directions for their use. Call your doctor or health care professional for advice if you get a fever, chills or sore throat, or other symptoms of a cold or flu. Do not treat yourself. This drug decreases your body's ability to fight infections. Try to avoid being around people who are sick. Avoid taking products that contain aspirin, acetaminophen, ibuprofen, naproxen, or ketoprofen unless instructed by your doctor. These medicines may hide a fever. Do not become pregnant while taking this medicine and for 6 months after the last dose. Women should inform their doctor if they wish to become pregnant or think they might be pregnant. Men should not father a child while taking this medicine and for 6 months after the last dose.  There is a potential for serious side effects to an unborn child. Talk to your health care professional or pharmacist for more information. Do not breast-feed an infant while taking this medicine. What side effects may I notice from receiving this medicine? Side effects that you should report to your doctor or health  care professional as soon as possible: -allergic reactions like skin rash, itching or hives, swelling of the face, lips, or tongue -breathing problems -changes in vision -fast, irregular heartbeat -feeling faint or lightheaded, falls -fever, chills -mouth sores -swelling of the ankles, feet, hands -unusually weak or tired Side effects that usually do not require medical attention (report to your doctor or health care professional if they continue or are bothersome): -changes in skin like acne, cracks, skin dryness -constipation -diarrhea -eyelash growth -headache -nail changes -nausea, vomiting -stomach upset Where should I keep my medicine? This drug is given in a hospital or clinic and will not be stored at home.  2017 Elsevier/Gold Standard (2014-11-14 17:21:33)

## 2016-10-06 NOTE — Progress Notes (Signed)
  [place] OFFICE PROGRESS NOTE   Diagnosis: Colon cancer  INTERVAL HISTORY:   Carolyn Barton returns as scheduled. She is now maintained on Xeloda and panitumumab. No mouth sores, diarrhea, or hand/foot pain. She reports increased pain at the right lower back for the past 2 days. The colostomy is functioning well. She saw Dr. Brantley Stage and reports there is no plan for surgery.  Objective:  Vital signs in last 24 hours:  Blood pressure 123/82, pulse 86, temperature 98.1 F (36.7 C), temperature source Oral, resp. rate 16, height '5\' 5"'$  (1.651 m), weight 123 lb 8 oz (56 kg), SpO2 100 %.    HEENT: No thrush or ulcers Resp: Lungs clear bilaterally Cardio: Regular rate and rhythm GI: No hepatomegaly, left lower quadrant colostomy, nontender Vascular: No leg edema  Skin: Dry skin over the trunk, mild erythematous rash over the face Musculoskeletal: Mild tenderness at the right iliac   Portacath/PICC-without erythema  Lab Results:  Lab Results  Component Value Date   WBC 7.0 09/15/2016   HGB 11.4 (L) 09/15/2016   HCT 36.2 09/15/2016   MCV 82.2 09/15/2016   PLT 403 (H) 09/15/2016   NEUTROABS 4.3 09/15/2016   09/15/2016-CEA less than 1  Medications: I have reviewed the patient's current medications.  Assessment/Plan: 1. Metastatic colorectal cancer-status post an exploratory laparotomy 03/03/2016 revealing a proximal rectal mass, peritoneal carcinomatosis, and liver metastases  Biopsy of peritoneal nodules 03/03/2016 confirmed metastatic adenocarcinoma consistent with a colon primary  Foundation 1 testing-MSI-stable, tumor mutation burden-low, no BRAF NRAS or KRAS mutation  Cycle 1 FOLFIRI/PANITUMUMAB 04/14/2016  Cycle 2 FOLFIRI/panitumumab 04/28/2016  Cycle 3 FOLFIRI/panitumumab 05/12/2016  Cycle 4 FOLFIRI/panitumumab 05/27/2016  Cycle 5 FOLFIRI/panitumumab 06/09/2016  Restaging CT abdomen/pelvis 06/20/2016-no evidence of disease progression, decreased  left hepatic lesion  Cycle 6 FOLFIRI/panitumumab 06/23/2016  Cycle 7 FOLFIRI/panitumumab 07/07/2016  Cycle 8 FOLFIRI/panitumumab 07/21/2016  Cycle 9 FOLFIRI/PANITUMUMAB 08/04/2016  Cycle 10 FOLFIRI/panitumumab 08/18/2016  Restaging CT 08/29/2016 with interval decrease in the size of the medial segment left liver lesion. Lesion identified previously in the rectosigmoid colon not evident on the current study.  5-FU/PANITUMUMAB 09/01/2016  Xeloda 7 days on/7 days off and PANITUMUMAB every 3 weeks 09/15/2016 (awaiting insurance approval for Xeloda 09/15/2016)  2.History of aBowel obstruction secondary to #1  3. Chronic back pain  4. Essential thrombocytosis  5. Early obstruction of the left ureter noted at the time of surgery 03/03/2016-Dr. Tannenbaum placed a left double-J stent 03/18/2016  6. Abdominal wall cellulitis 03/10/2016, blood cultures positive for coagulase negative staphylococcus-methicillin-resistant, status post treatment with vancomycin and Bactrim  7. Tachycardia-persistent following discharge from the hospital, likely related to anemia and deconditioning  CT chest 04/04/2016-negative for pulmonary embolism.  8. Port-A-Cath placement 04/10/2016, interventional radiology  9. Iron deficiency anemia. Feraheme 11/05/2015 , 11/12/2015,and 04/14/2016-improved  10. Pain/tenderness left posterior iliac region-resolved  11. Hypokalemia-started on potassium replacement 06/09/2016  12. Skin rash secondary to PANITUMUMAB.   Disposition:  She appears stable. No clinical evidence for progression of the colon cancer. I suspect the back pain is related to a benign musculoskeletal condition. We will observe this for now. She will see Dr. Brantley Stage in March to discuss reversing the colostomy. The plan is to continue indefinite Xeloda/panitumumab. She is tolerating the chemotherapy well. This should not interfere with surgery.  Carolyn Barton will return for an  office visit and panitumumab in 3 weeks.   [name] Coder, MD  10/06/2016  10:15 AM

## 2016-10-06 NOTE — Progress Notes (Signed)
Port was flushed several times with no success of blood return. Asked pt if it was possible if labs could be drawn through hand. Pt declined. Stated, " they had a time getting blood out, had to stick me about 6 times. I'll wait until they do the cathflow.". Desk nurse for Dr. Benay Spice was called and cathflow was released

## 2016-10-06 NOTE — Telephone Encounter (Signed)
Appointments scheduled per 1/8 LOS. Patient given AVS report and calendars with future scheduled appointments.  °

## 2016-10-06 NOTE — Progress Notes (Signed)
Nutrition follow-up completed with patient during chemotherapy for metastatic colon cancer. Weight improved documented as 123.5 pounds January 8 increased from 117.3 pounds December 4. Patient reports she is eating more often and tolerating food much better. She denies nutrition impact symptoms.  Nutrition diagnosis:  Unintentional weight loss resolved. Severe malnutrition, improved.  Intervention:  I educated patient to continue strategies for adequate calories and protein to minimize weight loss. Questions were answered.  Teach back method was used.  Monitoring, evaluation, goals:  Patient will continue to work to consume high-calorie high-protein foods for weight stabilization/weight gain.  Next visit: Scheduled as needed.  **Disclaimer: This note was dictated with voice recognition software. Similar sounding words can inadvertently be transcribed and this note may contain transcription errors which may not have been corrected upon publication of note.**

## 2016-10-06 NOTE — Telephone Encounter (Signed)
Per 1/8 LOS I have scheduled appt and gave calendar

## 2016-10-06 NOTE — Addendum Note (Signed)
Addended by: Carlene Coria L on: 10/06/2016 09:55 AM   Modules accepted: Orders

## 2016-10-06 NOTE — Progress Notes (Signed)
Oncology Nurse Navigator Documentation  Oncology Nurse Navigator Flowsheets 10/06/2016  Navigator Location [facility]  Referral date to RadOnc/MedOnc -  Navigator Encounter Type Treatment  Telephone -  Abnormal Finding Date -  Confirmed Diagnosis Date -  Surgery Date -  Treatment Initiated Date -  Patient Visit Type MedOnc  Treatment Phase Active Tx--Xeloda and Panitumumab  Barriers/Navigation Needs No barriers at this time;No Questions;No Needs  Education -  Interventions None required  Referrals -  Coordination of Care -  Education Method -  Support Groups/Services -  Acuity -  Time Spent with Patient 15

## 2016-10-08 NOTE — H&P (Signed)
Office Visit Report     08/13/2016   --------------------------------------------------------------------------------   Carolyn Barton  MRN: [id number]  PRIMARY CARE:  Patrina Levering, MD  DOB: 07-09-1956, 61 year old Female  REFERRING:    SSN:   PROVIDER:  Carolan Clines, M.D.    LOCATION:  [facility] [phone]   --------------------------------------------------------------------------------   CC: I have a ureteral obstruction.  HPI: Carolyn Barton is a 61 year-old female established patient who is here for a ureteral obstruction.  The problem is on the left side. Her problem was diagnosed 03/18/2016. She had the following x-rays done: CT Scan. She does have a stent in place. Her stent was placed approximately 02/28/2016 .   The ureteral obstruction is due to mass. The ureteral obstruction has been treated with chronic stent.   She is not having flank pain. She does not have a history of urinary infections. Her renal function is normal. She did not see blood in her urine.   metastatic colon cancer     ALLERGIES: None   MEDICATIONS: Duloxetine Hcl 60 mg capsule,delayed release  Furosemide 20 mg tablet  Hydromorphone Hcl 4 mg tablet  Methoadone  Movantik 25 mg tablet  Na Sulfate K Sulfate Mg Sulf  Vitamin B-12 1,000 mcg tablet  Vitamin D     GU PSH: Cystoscopy Insert Stent, Left - 03/18/2016      PSH Notes: wisdom teeth extraction  Lumbar cyst removal   NON-GU PSH: Lumbar Laminectomy    GU PMH: Hydronephrosis Unspec - 04/09/2016 Low back pain    NON-GU PMH: Malignant neoplasm of rectosigmoid junction - 04/09/2016 Anemia, unspecified Headache Tachycardia, unspecified    FAMILY HISTORY: Heart Disease - Mother melanoma - Mother   SOCIAL HISTORY: Marital Status: Married Current Smoking Status: Patient does not smoke anymore. Has not smoked since 03/29/1976.  Has not drank since 03/30/1991.  Does not drink caffeine.    REVIEW OF  SYSTEMS:    GU Review Female:   Patient denies frequent urination, hard to postpone urination, burning /pain with urination, get up at night to urinate, leakage of urine, stream starts and stops, trouble starting your stream, have to strain to urinate, and currently pregnant.  Gastrointestinal (Upper):   Patient denies nausea, vomiting, and indigestion/ heartburn.  Gastrointestinal (Lower):   Patient denies diarrhea and constipation.  Constitutional:   Patient denies fever, night sweats, weight loss, and fatigue.  Skin:   Patient denies skin rash/ lesion and itching.  Eyes:   Patient denies blurred vision and double vision.  Ears/ Nose/ Throat:   Patient denies sore throat and sinus problems.  Hematologic/Lymphatic:   Patient denies swollen glands and easy bruising.  Cardiovascular:   Patient denies leg swelling and chest pains.  Respiratory:   Patient denies shortness of breath and cough.  Endocrine:   Patient denies excessive thirst.  Musculoskeletal:   Patient denies back pain and joint pain.  Neurological:   Patient denies headaches and dizziness.  Psychologic:   Patient denies depression and anxiety.   VITAL SIGNS:      08/13/2016 11:58 AM  BP 131/92 mmHg  Pulse 96 /min  Temperature 98.1 F / 37 C   MULTI-SYSTEM PHYSICAL EXAMINATION:    Constitutional: Thin. No physical deformities. Normally developed. Good grooming.   Neck: Neck symmetrical, not swollen. Normal tracheal position.  Respiratory: No labored breathing, no use of accessory muscles.   Cardiovascular: Normal temperature, normal extremity pulses, no swelling, no varicosities.  Lymphatic: No enlargement of neck, axillae, groin.  Skin: No paleness, no jaundice, no cyanosis. No lesion, no ulcer, no rash.  Neurologic / Psychiatric: Oriented to time, oriented to place, oriented to person. No depression, no anxiety, no agitation.  Gastrointestinal: No mass, no tenderness, no rigidity, non obese abdomen.  Eyes: Normal  conjunctivae. Normal eyelids.  Musculoskeletal: Normal gait and station of head and neck.     PAST DATA REVIEWED:  Source Of History:  Patient   08/13/16  Urinalysis  Urine Appearance Clear   Urine Color Yellow   Urine Glucose Trace   Urine Bilirubin Neg   Urine Ketones Neg   Urine Specific Gravity 1.020   Urine Blood Trace   Urine pH 6.0   Urine Protein 2+   Urine Urobilinogen 0.2   Urine Nitrites Neg   Urine Leukocyte Esterase Neg   Urine WBC/hpf 0 - 5/hpf   Urine RBC/hpf 3 - 10/hpf   Urine Epithelial Cells 0 - 5/hpf   Urine Bacteria Few (10-25/hpf)   Urine Mucous Present   Urine Yeast NS (Not Seen)   Urine Trichomonas Not Present   Urine Cystals NS (Not Seen)    PROCEDURES:          Urinalysis w/Scope Dipstick Dipstick Cont'd Micro  Color: Yellow Bilirubin: Neg WBC/hpf: 0 - 5/hpf  Appearance: Clear Ketones: Neg RBC/hpf: 3 - 10/hpf  Specific Gravity: 1.020 Blood: Trace Bacteria: Few (10-25/hpf)  pH: 6.0 Protein: 2+ Cystals: NS (Not Seen)  Glucose: Trace Urobilinogen: 0.2 Casts: Hyaline    Nitrites: Neg Trichomonas: Not Present    Leukocyte Esterase: Neg Mucous: Present      Epithelial Cells: 0 - 5/hpf      Yeast: NS (Not Seen)      Sperm: Not Present    ASSESSMENT:      ICD-10 Details  1 GU:   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 This time for placement. She would like wait until January. She is having no difficulty with her stent, and, with normal urinalysis. We will plan to exchange and January at [facility].    PLAN:           Document Letter(s):  Created for Patient: Clinical Summary         Notes:   cc": Dr.William Brigitte Pulse  cc: Dr. Sharlot Gowda  cc: Dr. Benay Spice   Exchange JJ stent in January per pt's request. ( 48F x 24 cm Polaris).          The information contained in this medical  record document is considered private and confidential patient information. This information can only be used for the medical diagnosis and/or medical services that are being provided by the patient's selected caregivers. This information can only be distributed outside of the patient's care if the patient agrees and signs waivers of authorization for this information to be sent to an outside source or route.

## 2016-10-09 ENCOUNTER — Ambulatory Visit (HOSPITAL_BASED_OUTPATIENT_CLINIC_OR_DEPARTMENT_OTHER): Payer: BLUE CROSS/BLUE SHIELD | Admitting: Anesthesiology

## 2016-10-09 ENCOUNTER — Other Ambulatory Visit: Payer: Self-pay | Admitting: Oncology

## 2016-10-09 ENCOUNTER — Encounter (HOSPITAL_BASED_OUTPATIENT_CLINIC_OR_DEPARTMENT_OTHER): Payer: Self-pay | Admitting: *Deleted

## 2016-10-09 ENCOUNTER — Ambulatory Visit (HOSPITAL_BASED_OUTPATIENT_CLINIC_OR_DEPARTMENT_OTHER)
Admission: RE | Admit: 2016-10-09 | Discharge: 2016-10-09 | Disposition: A | Discharge status: Home / Self Care | Payer: BLUE CROSS/BLUE SHIELD | Source: Ambulatory Visit | Attending: Urology | Admitting: Urology

## 2016-10-09 ENCOUNTER — Encounter (HOSPITAL_BASED_OUTPATIENT_CLINIC_OR_DEPARTMENT_OTHER)
Admission: RE | Disposition: A | Discharge status: Home / Self Care | Payer: Self-pay | Source: Ambulatory Visit | Attending: Urology

## 2016-10-09 DIAGNOSIS — Z79899 Other long term (current) drug therapy: Secondary | ICD-10-CM | POA: Diagnosis not present

## 2016-10-09 DIAGNOSIS — C19 Malignant neoplasm of rectosigmoid junction: Secondary | ICD-10-CM | POA: Insufficient documentation

## 2016-10-09 DIAGNOSIS — C799 Secondary malignant neoplasm of unspecified site: Secondary | ICD-10-CM | POA: Insufficient documentation

## 2016-10-09 DIAGNOSIS — R51 Headache: Secondary | ICD-10-CM | POA: Diagnosis not present

## 2016-10-09 DIAGNOSIS — Z8249 Family history of ischemic heart disease and other diseases of the circulatory system: Secondary | ICD-10-CM | POA: Diagnosis not present

## 2016-10-09 DIAGNOSIS — Z809 Family history of malignant neoplasm, unspecified: Secondary | ICD-10-CM | POA: Insufficient documentation

## 2016-10-09 DIAGNOSIS — N1330 Unspecified hydronephrosis: Secondary | ICD-10-CM | POA: Diagnosis not present

## 2016-10-09 DIAGNOSIS — Z87891 Personal history of nicotine dependence: Secondary | ICD-10-CM | POA: Diagnosis not present

## 2016-10-09 DIAGNOSIS — R000 Tachycardia, unspecified: Secondary | ICD-10-CM | POA: Diagnosis not present

## 2016-10-09 DIAGNOSIS — N135 Crossing vessel and stricture of ureter without hydronephrosis: Secondary | ICD-10-CM | POA: Insufficient documentation

## 2016-10-09 DIAGNOSIS — D649 Anemia, unspecified: Secondary | ICD-10-CM | POA: Insufficient documentation

## 2016-10-09 HISTORY — DX: Hypokalemia: E87.6

## 2016-10-09 HISTORY — DX: Presence of spectacles and contact lenses: Z97.3

## 2016-10-09 HISTORY — DX: Colostomy status: Z93.3

## 2016-10-09 HISTORY — PX: CYSTOSCOPY W/ URETERAL STENT PLACEMENT: SHX1429

## 2016-10-09 SURGERY — CYSTOSCOPY, FLEXIBLE, WITH STENT REPLACEMENT
Anesthesia: General | Site: Ureter | Laterality: Left

## 2016-10-09 MED ORDER — KETOROLAC TROMETHAMINE 30 MG/ML IJ SOLN
INTRAMUSCULAR | Status: DC | PRN
  Administered 2016-10-09: 30 mg via INTRAVENOUS

## 2016-10-09 MED ORDER — DEXAMETHASONE SODIUM PHOSPHATE 4 MG/ML IJ SOLN
INTRAMUSCULAR | Status: DC | PRN
  Administered 2016-10-09: 10 mg via INTRAVENOUS

## 2016-10-09 MED ORDER — CEFAZOLIN SODIUM-DEXTROSE 2-4 GM/100ML-% IV SOLN
2.0000 g | INTRAVENOUS | Status: AC
Start: 2016-10-09 — End: 2016-10-09
  Administered 2016-10-09: 2 g via INTRAVENOUS
  Filled 2016-10-09: qty 100

## 2016-10-09 MED ORDER — ONDANSETRON HCL 4 MG/2ML IJ SOLN
INTRAMUSCULAR | Status: AC
  Filled 2016-10-09: qty 2

## 2016-10-09 MED ORDER — LIDOCAINE 2% (20 MG/ML) 5 ML SYRINGE
INTRAMUSCULAR | Status: AC
  Filled 2016-10-09: qty 10

## 2016-10-09 MED ORDER — MIDAZOLAM HCL 2 MG/2ML IJ SOLN
INTRAMUSCULAR | Status: AC
  Filled 2016-10-09: qty 2

## 2016-10-09 MED ORDER — PROPOFOL 10 MG/ML IV BOLUS
INTRAVENOUS | Status: DC | PRN
  Administered 2016-10-09: 130 mg via INTRAVENOUS

## 2016-10-09 MED ORDER — LIDOCAINE 2% (20 MG/ML) 5 ML SYRINGE
INTRAMUSCULAR | Status: DC | PRN
  Administered 2016-10-09: 50 mg via INTRAVENOUS

## 2016-10-09 MED ORDER — DEXAMETHASONE SODIUM PHOSPHATE 10 MG/ML IJ SOLN
INTRAMUSCULAR | Status: AC
  Filled 2016-10-09: qty 1

## 2016-10-09 MED ORDER — ONDANSETRON HCL 4 MG/2ML IJ SOLN
INTRAMUSCULAR | Status: DC | PRN
Start: 2016-10-09 — End: 2016-10-09
  Administered 2016-10-09: 4 mg via INTRAVENOUS

## 2016-10-09 MED ORDER — CEFAZOLIN SODIUM-DEXTROSE 2-4 GM/100ML-% IV SOLN
INTRAVENOUS | Status: AC
  Filled 2016-10-09: qty 100

## 2016-10-09 MED ORDER — FENTANYL CITRATE (PF) 100 MCG/2ML IJ SOLN
INTRAMUSCULAR | Status: AC
  Filled 2016-10-09: qty 2

## 2016-10-09 MED ORDER — SODIUM CHLORIDE 0.9 % IR SOLN
Status: DC | PRN
  Administered 2016-10-09: 1000 mL via INTRAVESICAL

## 2016-10-09 MED ORDER — MIDAZOLAM HCL 5 MG/5ML IJ SOLN
INTRAMUSCULAR | Status: DC | PRN
Start: 2016-10-09 — End: 2016-10-09
  Administered 2016-10-09: 1 mg via INTRAVENOUS

## 2016-10-09 MED ORDER — FENTANYL CITRATE (PF) 100 MCG/2ML IJ SOLN
INTRAMUSCULAR | Status: DC | PRN
  Administered 2016-10-09: 25 ug via INTRAVENOUS

## 2016-10-09 MED ORDER — LACTATED RINGERS IV SOLN
INTRAVENOUS | Status: DC
  Administered 2016-10-09: 10:00:00 via INTRAVENOUS
  Filled 2016-10-09: qty 1000

## 2016-10-09 MED ORDER — KETOROLAC TROMETHAMINE 30 MG/ML IJ SOLN
INTRAMUSCULAR | Status: AC
  Filled 2016-10-09: qty 1

## 2016-10-09 MED ORDER — PHENYLEPHRINE HCL 10 MG/ML IJ SOLN
INTRAMUSCULAR | Status: DC | PRN
  Administered 2016-10-09 (×3): 80 ug via INTRAVENOUS

## 2016-10-09 MED ORDER — PROPOFOL 10 MG/ML IV BOLUS
INTRAVENOUS | Status: AC
  Filled 2016-10-09: qty 20

## 2016-10-09 SURGICAL SUPPLY — 13 items
BAG DRAIN URO-CYSTO SKYTR STRL (DRAIN) ×2 IMPLANT
BAG DRN UROCATH (DRAIN) ×1
CATH INTERMIT  6FR 70CM (CATHETERS) IMPLANT
CLOTH BEACON ORANGE TIMEOUT ST (SAFETY) ×2 IMPLANT
GLOVE BIO SURGEON STRL SZ7.5 (GLOVE) ×2 IMPLANT
GOWN STRL REUS W/ TWL XL LVL3 (GOWN DISPOSABLE) ×1 IMPLANT
GOWN STRL REUS W/TWL XL LVL3 (GOWN DISPOSABLE) ×2
GUIDEWIRE ANG ZIPWIRE 038X150 (WIRE) IMPLANT
GUIDEWIRE STR DUAL SENSOR (WIRE) ×1 IMPLANT
KIT ROOM TURNOVER WOR (KITS) ×2 IMPLANT
MANIFOLD NEPTUNE II (INSTRUMENTS) ×1 IMPLANT
PACK CYSTO (CUSTOM PROCEDURE TRAY) ×2 IMPLANT
STENT POLARIS LOOP 8FR X 24 CM (STENTS) ×1 IMPLANT

## 2016-10-09 NOTE — Anesthesia Postprocedure Evaluation (Signed)
Anesthesia Post Note  Patient: Carolyn Barton  Procedure(s) Performed: Procedure(s) (LRB): CYSTOSCOPY WITH LEFT STENT REPLACEMENT 18F POLARIS STENT 24CM (Left)  Patient location during evaluation: PACU Anesthesia Type: General Level of consciousness: awake Pain management: satisfactory to patient Vital Signs Assessment: post-procedure vital signs reviewed and stable Respiratory status: spontaneous breathing Cardiovascular status: stable Anesthetic complications: no       Last Vitals:  Vitals:   10/09/16 1200 10/09/16 1230  BP: 103/75 111/69  Pulse: 82 88  Resp: (!) 8 12  Temp:  36.6 C    Last Pain:  Vitals:   10/09/16 1230  TempSrc:   PainSc: 0-No pain                 Colene Mines EDWARD

## 2016-10-09 NOTE — Discharge Instructions (Signed)
[facility] °[phone] °Post Ureteroscopy With or Without Stent Instructions ° °Definitions: ° °Ureter: The duct that transports urine from the kidney to the bladder. °Stent:   A plastic hollow tube that is placed into the ureter, from the kidney to the                 bladder to prevent the ureter from swelling shut. ° °GENERAL INSTRUCTIONS: ° °Despite the fact that no skin incisions were used, the area around the ureter and bladder is raw and irritated. The stent is a foreign body which will further irritate the bladder wall. This irritation is manifested by increased frequency of urination, both day and night, and by an increase in the urge to urinate. In some, the urge to urinate is present almost always. Sometimes the urge is strong enough that you may not be able to stop yourself from urinating. The only real cure is to remove the stent and then give time for the bladder wall to heal which can't be done until the danger of the ureter swelling shut has passed, which varies. ° °You may see some blood in your urine while the stent is in place and a few days afterwards. Do not be alarmed, even if the urine was clear for a while. Get off your feet and drink lots of fluids until clearing occurs. If you start to pass clots or don't improve, call us. ° °DIET: °You may return to your normal diet immediately. Because of the raw surface of your bladder, alcohol, spicy foods, acid type foods and drinks with caffeine may cause irritation or frequency and should be used in moderation. To keep your urine flowing freely and to avoid constipation, drink plenty of fluids during the day ( 8-10 glasses ). °Tip: Avoid cranberry juice because it is very acidic. ° °ACTIVITY: °Your physical activity doesn't need to be restricted. However, if you are very active, you may see some blood in your urine. We suggest that you reduce your activity under these circumstances until the bleeding has stopped. ° °BOWELS: °It is  important to keep your bowels regular during the postoperative period. Straining with bowel movements can cause bleeding. A bowel movement every other day is reasonable. Use a mild laxative if needed, such as Milk of Magnesia 2-3 tablespoons, or 2 Dulcolax tablets. Call if you continue to have problems. If you have been taking narcotics for pain, before, during or after your surgery, you may be constipated. Take a laxative if necessary. ° ° °MEDICATION: °You should resume your pre-surgery medications unless told not to. In addition you will often be given an antibiotic to prevent infection. These should be taken as prescribed until the bottles are finished unless you are having an unusual reaction to one of the drugs. ° °PROBLEMS YOU SHOULD REPORT TO US: °· Fevers over 100.5 Fahrenheit. °· Heavy bleeding, or clots ( See above notes about blood in urine ). °· Inability to urinate. °· Drug reactions ( hives, rash, nausea, vomiting, diarrhea ). °· Severe burning or pain with urination that is not improving. ° °FOLLOW-UP: °You will need a follow-up appointment to monitor your progress. Call for this appointment at the number listed above. Usually the first appointment will be about three to fourteen days after your surgery. ° ° ° ° ° °Post Anesthesia Home Care Instructions ° °Activity: °Get plenty of rest for the remainder of the day. A responsible adult should stay with you for 24 hours following the procedure.  °  For the next 24 hours, DO NOT: -Drive a car -Paediatric nurse -Drink alcoholic beverages -Take any medication unless instructed by your physician -Make any legal decisions or sign important papers.  Meals: Start with liquid foods such as gelatin or soup. Progress to regular foods as tolerated. Avoid greasy, spicy, heavy foods. If nausea and/or vomiting occur, drink only clear liquids until the nausea and/or vomiting subsides. Call your physician if vomiting continues.  Special  Instructions/Symptoms: Your throat may feel dry or sore from the anesthesia or the breathing tube placed in your throat during surgery. If this causes discomfort, gargle with warm salt water. The discomfort should disappear within 24 hours.  If you had a scopolamine patch placed behind your ear for the management of post- operative nausea and/or vomiting:  1. The medication in the patch is effective for 72 hours, after which it should be removed.  Wrap patch in a tissue and discard in the trash. Wash hands thoroughly with soap and water. 2. You may remove the patch earlier than 72 hours if you experience unpleasant side effects which may include dry mouth, dizziness or visual disturbances. 3. Avoid touching the patch. Wash your hands with soap and water after contact with the patch.   Hydronephrosis Introduction Hydronephrosis is the enlargement of a kidney due to a blockage that stops urine from flowing out of the body. What are the causes? Common causes of this condition include:  A birth (congenital) defect of the kidney.  A congenital defect of the tube through which urine travels (ureter).  Kidney stones.  An enlarged prostate gland.  A tumor.  Cancer of the prostate, bladder, uterus, ovary, or colon.  A blood clot. What are the signs or symptoms? Symptoms of this condition include:  Pain or discomfort in your side (flank).  Swelling of the abdomen.  Pain in the abdomen.  Nausea and vomiting.  Fever.  Pain while passing urine.  Feeling of urgency to urinate.  Frequent urination.  Infection of the urinary tract. In some cases, there are no symptoms. How is this diagnosed? This condition may be diagnosed with:  A medical history.  A physical exam.  Blood and urine tests to check kidney function.  Imaging tests, such as an X-ray, ultrasound, CT scan, or MRI.  A test in which a rigid or flexible telescope (cystoscope) is used to view the site of the  blockage. How is this treated? Treatment for this condition depends on where the blockage is located, how long it has been there, and what caused it. The goal of treatment is to remove the blockage. Treatment options include:  A procedure to put in a soft tube to help drain urine.  Antibiotic medicines to treat or prevent infection.  Shock-wave therapy (lithotripsy) to help eliminate kidney stones. Follow these instructions at home:  Get lots of rest.  Drink enough fluid to keep your urine clear or pale yellow.  If you have a drain in, follow your health care provider's instructions about how to care for it.  Take medicines only as directed by your health care provider.  If you were prescribed an antibiotic medicine, finish all of it even if you start to feel better.  Keep all follow-up visits as directed by your health care provider. This is important. Contact a health care provider if:  You continue to have symptoms after treatment.  You develop new symptoms.  You have a problem with a drainage device.  Your urine becomes cloudy or  bloody.  You have a fever. Get help right away if:  You have severe flank or abdominal pain.  You develop vomiting and are unable to keep fluids down. This information is not intended to replace advice given to you by your health care provider. Make sure you discuss any questions you have with your health care provider. Document Released: 07/13/2007 Document Revised: 02/21/2016 Document Reviewed: 09/11/2014  2017 Elsevier

## 2016-10-09 NOTE — Interval H&P Note (Signed)
History and Physical Interval Note:  10/09/2016 10:35 AM  Carolyn Barton  has presented today for surgery, with the diagnosis of URETERAL OBSTRUCTION  The various methods of treatment have been discussed with the patient and family. After consideration of risks, benefits and other options for treatment, the patient has consented to  Procedure(s): CYSTOSCOPY WITH LEFT STENT REPLACEMENT 71F POLARIS STENT 24CM (Left) as a surgical intervention .  The patient's history has been reviewed, patient examined, no change in status, stable for surgery.  I have reviewed the patient's chart and labs.  Questions were answered to the patient's satisfaction.     Carolyn Barton

## 2016-10-09 NOTE — Anesthesia Preprocedure Evaluation (Addendum)
Anesthesia Evaluation  Patient identified by MRN, date of birth, ID band Patient awake    Reviewed: Allergy & Precautions, H&P , Patient's Chart, lab work & pertinent test results, reviewed documented beta blocker date and time   History of Anesthesia Complications (+) PONV  Airway Mallampati: II  TM Distance: >3 FB Neck ROM: full    Dental no notable dental hx.    Pulmonary former smoker,    Pulmonary exam normal breath sounds clear to auscultation       Cardiovascular Exercise Tolerance: Good  Rhythm:regular Rate:Normal     Neuro/Psych    GI/Hepatic   Endo/Other    Renal/GU      Musculoskeletal   Abdominal   Peds  Hematology  (+) anemia ,   Anesthesia Other Findings   Reproductive/Obstetrics                            Anesthesia Physical Anesthesia Plan  ASA: II  Anesthesia Plan: General   Post-op Pain Management:    Induction: Intravenous  Airway Management Planned: LMA  Additional Equipment:   Intra-op Plan:   Post-operative Plan:   Informed Consent: I have reviewed the patients History and Physical, chart, labs and discussed the procedure including the risks, benefits and alternatives for the proposed anesthesia with the patient or authorized representative who has indicated his/her understanding and acceptance.   Dental Advisory Given and Dental advisory given  Plan Discussed with: CRNA and Surgeon  Anesthesia Plan Comments: (Discussed GA with LMA, possible sore throat, potential need to switch to ETT, N/V, pulmonary aspiration. Questions answered. )        Anesthesia Quick Evaluation

## 2016-10-09 NOTE — Transfer of Care (Signed)
Immediate Anesthesia Transfer of Care Note  Patient: Carolyn Barton  Procedure(s) Performed: Procedure(s): CYSTOSCOPY WITH LEFT STENT REPLACEMENT 81F POLARIS STENT 24CM (Left)  Patient Location: PACU  Anesthesia Type:General  Level of Consciousness: awake, alert , oriented and patient cooperative  Airway & Oxygen Therapy: Patient Spontanous Breathing and Patient connected to nasal cannula oxygen  Post-op Assessment: Report given to RN and Post -op Vital signs reviewed and stable  Post vital signs: Reviewed and stable  Last Vitals:  Vitals:   10/09/16 0938  BP: 129/77  Pulse: 94  Resp: 16  Temp: 36.9 C    Last Pain:  Vitals:   10/09/16 1000  TempSrc:   PainSc: 5          Complications: No apparent anesthesia complications

## 2016-10-09 NOTE — Anesthesia Procedure Notes (Signed)
Procedure Name: LMA Insertion Date/Time: 10/09/2016 10:40 AM Performed by: Wanita Chamberlain Pre-anesthesia Checklist: Patient identified, Timeout performed, Emergency Drugs available, Suction available and Patient being monitored Patient Re-evaluated:Patient Re-evaluated prior to inductionOxygen Delivery Method: Circle system utilized Preoxygenation: Pre-oxygenation with 100% oxygen Intubation Type: IV induction Ventilation: Mask ventilation without difficulty LMA: LMA inserted LMA Size: 3.0 Number of attempts: 1 Placement Confirmation: positive ETCO2 and breath sounds checked- equal and bilateral Tube secured with: Tape Dental Injury: Teeth and Oropharynx as per pre-operative assessment

## 2016-10-09 NOTE — Op Note (Signed)
Pre-operative diagnosis :  Metastatic rectosigmoid adenocarcinoma, with left ureteral obstruction  Postoperative diagnosis: Same  Operation: Cystourethroscopy, removal of left double-J stent, replacement with 8 [place] by 24 cm Polaris double-J stent  Surgeon:  S. Gaynelle Arabian, MD  First assistant:  None  Anesthesia:  Gen. LMA  Preparation: After appropriate pre-anesthesia, the patient was brought to the operative room, placed on the operating table in the dorsal supine position where general LMA anesthesia was introduced. She was then replaced in the dorsal lithotomy position with pubis was prepped with Betadine solution and draped in usual fashion. The left hand was previously marked. The history was reviewed.  Review history:  Carolyn Barton is a 61 year-old female established patient who is here for a ureteral obstruction.  The problem is on the left side. Her problem was diagnosed 03/18/2016. She had the following x-rays done: CT Scan. She does have a stent in place. Her stent was placed approximately 02/28/2016 .   The ureteral obstruction is due to mass. The ureteral obstruction has been treated with chronic stent.   She is not having flank pain. She does not have a history of urinary infections. Her renal function is normal. She did not see blood in her urine.   metastatic colon cancer   Statement of  Likelihood of Success: Excellent. TIME-OUT observed.:  Procedure: Fluoroscopic exam showed left double-J stent coiled in the renal pelvis. Cystourethroscopy was accomplished, which showed normal-appearing bladder, with Polaris stent in position. Using the biopsy forceps, the Polaris stent was removed halfway. A 0.038 guidewire was then passed through the central portion of the Polaris stent, and coiled in the renal pelvis, under fluoroscopic control. The original stent was then removed. A new 8 [place] by 24 cm Polaris stent was then passed over the 038 guidewire, through the ureter,  and coiled in the renal pelvis. The bottom portion exited through the left ureteral orifice. Placement was checked with the fluoroscope.  The bladder was drained of fluid, and the patient was awakened and taken to recovery room in good condition.

## 2016-10-10 ENCOUNTER — Encounter (HOSPITAL_BASED_OUTPATIENT_CLINIC_OR_DEPARTMENT_OTHER): Payer: Self-pay | Admitting: Urology

## 2016-10-16 ENCOUNTER — Telehealth: Payer: Self-pay | Admitting: *Deleted

## 2016-10-16 NOTE — Telephone Encounter (Signed)
Fax from [facility] call service. Pt called on 1/16 to report has not heard from pharmacy re: chemo delivery. Left message on voicemail for pt to call back if she hasn't heard from pharmacy yet.

## 2016-10-17 ENCOUNTER — Telehealth: Payer: Self-pay | Admitting: *Deleted

## 2016-10-17 NOTE — Telephone Encounter (Signed)
FYI "This is [facility] calling to see if you all have benefits for this patient.  What we have has Termed and she is no longer in our system."  Provided current insurance information and group numbers that took effect 09-29-2016.  [facility] "will try again.  If still rejects will reach out to patient."

## 2016-10-20 ENCOUNTER — Telehealth: Payer: Self-pay | Admitting: Pharmacist

## 2016-10-20 ENCOUNTER — Telehealth: Payer: Self-pay | Admitting: *Deleted

## 2016-10-20 NOTE — Telephone Encounter (Signed)
Message from pt reporting Xeloda requires prior auth. [employer] # [phone]. Pt was due to begin new cycle on 10/18/16. Message to oral chemo navigator for prior auth.

## 2016-10-20 NOTE — Telephone Encounter (Signed)
Oral Chemotherapy Pharmacist Encounter  Received routed patient call that Xeloda would require new prior authorization per the patient. I called the number to [place] provided by the patient [phone]), please note we have received no correspondance from the filling pharmacy ([facility] as required by patient's insurance) that a new PA was required.  [employer] will fax me PA form, I will complete and fax back to them.  This encounter will continue to be updated until final determination. We will alert [facility] at [phone] to continue to process Rx once we have obtained PA.  Oral Oncology Clinic will continue to follow.   Johny Drilling, PharmD, BCPS, BCOP 10/20/2016  11:57 AM Oral Oncology Clinic [phone]

## 2016-10-20 NOTE — Telephone Encounter (Signed)
Oral Chemotherapy Pharmacist Encounter  Received PA form from [facility]. Form completed and faxed to [phone].  Oral oncology Clinic will continue to follow.  Johny Drilling, PharmD, BCPS, BCOP 10/20/2016  2:08 PM Oral Oncology Clinic [phone]

## 2016-10-22 NOTE — Telephone Encounter (Signed)
Just received call about Xeloda prior authorization. They are stating that they did not receive Xeloda authorization form. Phone # to contact facility is [phone]

## 2016-10-22 NOTE — Telephone Encounter (Signed)
Oral Chemotherapy Pharmacist Encounter  Received notification from Triage RN that PA form had not been received by [facility].  I called [employer] at [phone] to inquire about PA status.  Informed [employer] that we had faxed PA form on 1/22 and had received confirmation of successful fax. They confirmed it is not in their system and want [place] to re-fax form.  I inquired about performing PA over the phone to prevent further delays. They are not able to perform PAs by form, must be done by fax form.  Fax form sent on 1/22 re-sent to [phone], along with fax confirmation sheet from 1/22. This was sent with a cover sheet requesting URGENT determination due to PA delays. Received confirmation of successful fax of 3 pages.  Oral Oncology Clinic will continue to follow.  Johny Drilling, PharmD, BCPS, BCOP 10/22/2016  3:03 PM Oral Oncology Clinic [phone]

## 2016-10-23 ENCOUNTER — Telehealth: Payer: Self-pay | Admitting: *Deleted

## 2016-10-23 NOTE — Telephone Encounter (Signed)
Oral Chemotherapy Pharmacist Encounter  Received notification from [employer] [place] that prior authorization for patient's Xeloda (capecitabine) has been approved. Effective dates: 10/22/16-09/15/17 Ref# [id number]  I called [facility] at [phone] to alert them to process Rx. They will reach out to patient with copayment and shipping information.  Johny Drilling, PharmD, BCPS, BCOP 10/23/2016  10:24 AM Oral Oncology Clinic [phone]

## 2016-10-23 NOTE — Telephone Encounter (Signed)
Message from pt reporting Xeloda will be delivered on 1/26. Asks if she should begin when she receives it. Reviewed with Dr. Benay Spice: YES. Resume 7 days on 7 days off. Follow up 1/29 as scheduled. Left message informing pt.

## 2016-10-24 ENCOUNTER — Telehealth: Payer: Self-pay | Admitting: Pharmacist

## 2016-10-24 NOTE — Telephone Encounter (Signed)
Oral Chemotherapy Pharmacist Encounter Attempted to reach patient for follow up on oral medication: Xeloda. No answer. Left VM for patient to call back with any questions or issues.   Thank you,  Nuala Alpha, PharmD, [place] [facility] [phone]

## 2016-10-26 ENCOUNTER — Other Ambulatory Visit: Payer: Self-pay | Admitting: Oncology

## 2016-10-27 ENCOUNTER — Ambulatory Visit (HOSPITAL_BASED_OUTPATIENT_CLINIC_OR_DEPARTMENT_OTHER): Payer: BLUE CROSS/BLUE SHIELD

## 2016-10-27 ENCOUNTER — Ambulatory Visit (HOSPITAL_BASED_OUTPATIENT_CLINIC_OR_DEPARTMENT_OTHER): Payer: BLUE CROSS/BLUE SHIELD | Admitting: Oncology

## 2016-10-27 ENCOUNTER — Other Ambulatory Visit (HOSPITAL_BASED_OUTPATIENT_CLINIC_OR_DEPARTMENT_OTHER): Payer: BLUE CROSS/BLUE SHIELD

## 2016-10-27 ENCOUNTER — Ambulatory Visit: Payer: BLUE CROSS/BLUE SHIELD

## 2016-10-27 ENCOUNTER — Telehealth: Payer: Self-pay | Admitting: Oncology

## 2016-10-27 ENCOUNTER — Telehealth: Payer: Self-pay | Admitting: *Deleted

## 2016-10-27 VITALS — BP 106/90 | HR 93 | Temp 98.4°F | Resp 18 | Ht 65.0 in | Wt 121.7 lb

## 2016-10-27 DIAGNOSIS — L03032 Cellulitis of left toe: Secondary | ICD-10-CM | POA: Diagnosis not present

## 2016-10-27 DIAGNOSIS — Z5112 Encounter for antineoplastic immunotherapy: Secondary | ICD-10-CM | POA: Diagnosis not present

## 2016-10-27 DIAGNOSIS — C19 Malignant neoplasm of rectosigmoid junction: Secondary | ICD-10-CM

## 2016-10-27 DIAGNOSIS — C786 Secondary malignant neoplasm of retroperitoneum and peritoneum: Secondary | ICD-10-CM

## 2016-10-27 DIAGNOSIS — D509 Iron deficiency anemia, unspecified: Secondary | ICD-10-CM

## 2016-10-27 DIAGNOSIS — C20 Malignant neoplasm of rectum: Secondary | ICD-10-CM

## 2016-10-27 DIAGNOSIS — C787 Secondary malignant neoplasm of liver and intrahepatic bile duct: Secondary | ICD-10-CM

## 2016-10-27 DIAGNOSIS — L97429 Non-pressure chronic ulcer of left heel and midfoot with unspecified severity: Secondary | ICD-10-CM

## 2016-10-27 DIAGNOSIS — L97419 Non-pressure chronic ulcer of right heel and midfoot with unspecified severity: Secondary | ICD-10-CM

## 2016-10-27 DIAGNOSIS — R234 Changes in skin texture: Secondary | ICD-10-CM

## 2016-10-27 DIAGNOSIS — E876 Hypokalemia: Secondary | ICD-10-CM

## 2016-10-27 DIAGNOSIS — M549 Dorsalgia, unspecified: Secondary | ICD-10-CM

## 2016-10-27 DIAGNOSIS — Z95828 Presence of other vascular implants and grafts: Secondary | ICD-10-CM

## 2016-10-27 LAB — CBC WITH DIFFERENTIAL/PLATELET
BASO%: 1.9 % (ref 0.0–2.0)
Basophils Absolute: 0.1 10*3/uL (ref 0.0–0.1)
EOS ABS: 0.3 10*3/uL (ref 0.0–0.5)
EOS%: 4.2 % (ref 0.0–7.0)
HCT: 35.2 % (ref 34.8–46.6)
HGB: 11.2 g/dL — ABNORMAL LOW (ref 11.6–15.9)
LYMPH%: 17.5 % (ref 14.0–49.7)
MCH: 25.4 pg (ref 25.1–34.0)
MCHC: 31.8 g/dL (ref 31.5–36.0)
MCV: 79.9 fL (ref 79.5–101.0)
MONO#: 0.7 10*3/uL (ref 0.1–0.9)
MONO%: 9.9 % (ref 0.0–14.0)
NEUT#: 4.4 10*3/uL (ref 1.5–6.5)
NEUT%: 66.5 % (ref 38.4–76.8)
Platelets: 435 10*3/uL — ABNORMAL HIGH (ref 145–400)
RBC: 4.41 10*6/uL (ref 3.70–5.45)
RDW: 18.7 % — ABNORMAL HIGH (ref 11.2–14.5)
WBC: 6.6 10*3/uL (ref 3.9–10.3)
lymph#: 1.2 10*3/uL (ref 0.9–3.3)

## 2016-10-27 LAB — MAGNESIUM: Magnesium: 1.9 mg/dl (ref 1.5–2.5)

## 2016-10-27 LAB — COMPREHENSIVE METABOLIC PANEL
ALT: 12 U/L (ref 0–55)
ANION GAP: 7 meq/L (ref 3–11)
AST: 17 U/L (ref 5–34)
Albumin: 2.9 g/dL — ABNORMAL LOW (ref 3.5–5.0)
Alkaline Phosphatase: 178 U/L — ABNORMAL HIGH (ref 40–150)
BUN: 12 mg/dL (ref 7.0–26.0)
CHLORIDE: 106 meq/L (ref 98–109)
CO2: 26 meq/L (ref 22–29)
Calcium: 8.7 mg/dL (ref 8.4–10.4)
Creatinine: 0.5 mg/dL — ABNORMAL LOW (ref 0.6–1.1)
Glucose: 82 mg/dl (ref 70–140)
Potassium: 3.9 mEq/L (ref 3.5–5.1)
Sodium: 139 mEq/L (ref 136–145)
Total Bilirubin: 0.37 mg/dL (ref 0.20–1.20)
Total Protein: 5.8 g/dL — ABNORMAL LOW (ref 6.4–8.3)

## 2016-10-27 LAB — CEA (IN HOUSE-CHCC)

## 2016-10-27 MED ORDER — HEPARIN SOD (PORK) LOCK FLUSH 100 UNIT/ML IV SOLN
500.0000 [IU] | Freq: Once | INTRAVENOUS | Status: AC | PRN
  Administered 2016-10-27: 500 [IU]
  Filled 2016-10-27: qty 5

## 2016-10-27 MED ORDER — SODIUM CHLORIDE 0.9% FLUSH
10.0000 mL | INTRAVENOUS | Status: DC | PRN
  Administered 2016-10-27: 10 mL
  Filled 2016-10-27: qty 10

## 2016-10-27 MED ORDER — SODIUM CHLORIDE 0.9 % IV SOLN
5.8000 mg/kg | Freq: Once | INTRAVENOUS | Status: AC
  Administered 2016-10-27: 300 mg via INTRAVENOUS
  Filled 2016-10-27: qty 15

## 2016-10-27 MED ORDER — SODIUM CHLORIDE 0.9 % IV SOLN
Freq: Once | INTRAVENOUS | Status: AC
  Administered 2016-10-27: 11:00:00 via INTRAVENOUS

## 2016-10-27 MED ORDER — SODIUM CHLORIDE 0.9 % IJ SOLN
10.0000 mL | INTRAMUSCULAR | Status: DC | PRN
  Administered 2016-10-27: 10 mL via INTRAVENOUS
  Filled 2016-10-27: qty 10

## 2016-10-27 NOTE — Patient Instructions (Signed)
[facility] Discharge Instructions for Patients Receiving Chemotherapy  Today you received the following chemotherapy agents Vectibix  To help prevent nausea and vomiting after your treatment, we encourage you to take your nausea medication as directed.   If you develop nausea and vomiting that is not controlled by your nausea medication, call the clinic.   BELOW ARE SYMPTOMS THAT SHOULD BE REPORTED IMMEDIATELY:  *FEVER GREATER THAN 100.5 F  *CHILLS WITH OR WITHOUT FEVER  NAUSEA AND VOMITING THAT IS NOT CONTROLLED WITH YOUR NAUSEA MEDICATION  *UNUSUAL SHORTNESS OF BREATH  *UNUSUAL BRUISING OR BLEEDING  TENDERNESS IN MOUTH AND THROAT WITH OR WITHOUT PRESENCE OF ULCERS  *URINARY PROBLEMS  *BOWEL PROBLEMS  UNUSUAL RASH Items with * indicate a potential emergency and should be followed up as soon as possible.  Feel free to call the clinic you have any questions or concerns. The clinic phone number is [phone].  Please show the CHEMO ALERT CARD at check-in to the Emergency Department and triage nurse.   

## 2016-10-27 NOTE — Telephone Encounter (Signed)
Message sent to chemo scheduler to be added per 10/27/16 los. Appointments scheduled per 10/27/16 los. Patient was given a copy of the appointment schedule and AVS report, per 10/27/16 los.

## 2016-10-27 NOTE — Telephone Encounter (Signed)
Per 1/29 LOS and staff message I have scheduled appt and gave calendar. Notified the scheduler 

## 2016-10-27 NOTE — Patient Instructions (Signed)

## 2016-10-27 NOTE — Progress Notes (Signed)
  [place] OFFICE PROGRESS NOTE   Diagnosis: Colon cancer  INTERVAL HISTORY:   Ms. Nelson returns as scheduled. She reports feeling well. She started the most recent week of Xeloda on 10/24/2016. No mouth sores or diarrhea. She has dryness of the skin including the hands and feet. She underwent exchange of the left ureter stent by Dr. Gaynelle Arabian on 10/09/2016. She reports tolerating the procedure well.  Objective:  Vital signs in last 24 hours:  Blood pressure 106/90, pulse 93, temperature 98.4 F (36.9 C), temperature source Oral, resp. rate 18, height '5\' 5"'$  (1.651 m), weight 121 lb 11.2 oz (55.2 kg), SpO2 100 %.    HEENT: No thrush or ulcers Resp: Lungs clear bilaterally Cardio: Regular rate and rhythm GI: No hepatomegaly, left lower quadrant colostomy, no mass, nontender Vascular: No leg edema  Skin: Scabbed lesion at the parietal scalp, dry flaking over the trunk and extremities. Mild paronychia at the left great toe, dry desquamation at the soles with linear ulcers at the heels.   Portacath/PICC-without erythema  Lab Results:  Lab Results  Component Value Date   WBC 6.6 10/27/2016   HGB 11.2 (L) 10/27/2016   HCT 35.2 10/27/2016   MCV 79.9 10/27/2016   PLT 435 (H) 10/27/2016   NEUTROABS 4.4 10/27/2016   CEA-less than 1  Medications: I have reviewed the patient's current medications.  Assessment/Plan: 1. Metastatic colorectal cancer-status post an exploratory laparotomy 03/03/2016 revealing a proximal rectal mass, peritoneal carcinomatosis, and liver metastases  Biopsy of peritoneal nodules 03/03/2016 confirmed metastatic adenocarcinoma consistent with a colon primary  Foundation 1 testing-MSI-stable, tumor mutation burden-low, no BRAF NRAS or KRAS mutation  Cycle 1 FOLFIRI/PANITUMUMAB 04/14/2016  Cycle 2 FOLFIRI/panitumumab 04/28/2016  Cycle 3 FOLFIRI/panitumumab 05/12/2016  Cycle 4 FOLFIRI/panitumumab 05/27/2016  Cycle 5  FOLFIRI/panitumumab 06/09/2016  Restaging CT abdomen/pelvis 06/20/2016-no evidence of disease progression, decreased left hepatic lesion  Cycle 6 FOLFIRI/panitumumab 06/23/2016  Cycle 7 FOLFIRI/panitumumab 07/07/2016  Cycle 8 FOLFIRI/panitumumab 07/21/2016  Cycle 9 FOLFIRI/PANITUMUMAB 08/04/2016  Cycle 10 FOLFIRI/panitumumab 08/18/2016  Restaging CT 08/29/2016 with interval decrease in the size of the medial segment left liver lesion. Lesion identified previously in the rectosigmoid colon not evident on the current study.  5-FU/PANITUMUMAB 09/01/2016  Xeloda 7 days on/7 days off and PANITUMUMAB every 3 weeks 09/15/2016 (awaiting insurance approval for Xeloda 09/15/2016)  2.History of aBowel obstruction secondary to #1  3. Chronic back pain  4. Essential thrombocytosis  5. Early obstruction of the left ureter noted at the time of surgery 03/03/2016-Dr. Tannenbaum placed a left double-J stent 03/18/2016  6. Abdominal wall cellulitis 03/10/2016, blood cultures positive for coagulase negative staphylococcus-methicillin-resistant, status post treatment with vancomycin and Bactrim  7. Tachycardia-persistent following discharge from the hospital, likely related to anemia and deconditioning  CT chest 04/04/2016-negative for pulmonary embolism.  8. Port-A-Cath placement 04/10/2016, interventional radiology  9. Iron deficiency anemia. Feraheme 11/05/2015 , 11/12/2015,and 04/14/2016-improved  10. Pain/tenderness left posterior iliac region-resolved  11. Hypokalemia-started on potassium replacement 06/09/2016  12. Skin rash and paronychia secondary to PANITUMUMAB.    Disposition:  Ms. Casebolt appears stable. She is tolerating the panitumumab and Xeloda well. She has mild paronychia and skin changes from the panitumumab. She will continue moisturizers. She will complete another treatment with panitumumab today. She continues Xeloda on a 7 day on/7 day off  schedule. Ms. Palladino will return for an office visit in 3 weeks.  [name] Coder, MD  10/27/2016  6:22 PM

## 2016-11-16 ENCOUNTER — Other Ambulatory Visit: Payer: Self-pay | Admitting: Oncology

## 2016-11-17 ENCOUNTER — Telehealth: Payer: Self-pay | Admitting: *Deleted

## 2016-11-17 ENCOUNTER — Ambulatory Visit: Payer: BLUE CROSS/BLUE SHIELD

## 2016-11-17 ENCOUNTER — Other Ambulatory Visit (HOSPITAL_BASED_OUTPATIENT_CLINIC_OR_DEPARTMENT_OTHER): Payer: BLUE CROSS/BLUE SHIELD

## 2016-11-17 ENCOUNTER — Ambulatory Visit (HOSPITAL_BASED_OUTPATIENT_CLINIC_OR_DEPARTMENT_OTHER): Payer: BLUE CROSS/BLUE SHIELD | Admitting: Oncology

## 2016-11-17 ENCOUNTER — Ambulatory Visit (HOSPITAL_BASED_OUTPATIENT_CLINIC_OR_DEPARTMENT_OTHER): Payer: BLUE CROSS/BLUE SHIELD

## 2016-11-17 ENCOUNTER — Telehealth: Payer: Self-pay | Admitting: Oncology

## 2016-11-17 VITALS — BP 117/72 | HR 76 | Temp 98.5°F | Resp 16 | Ht 65.0 in | Wt 124.0 lb

## 2016-11-17 DIAGNOSIS — C787 Secondary malignant neoplasm of liver and intrahepatic bile duct: Secondary | ICD-10-CM | POA: Diagnosis not present

## 2016-11-17 DIAGNOSIS — R21 Rash and other nonspecific skin eruption: Secondary | ICD-10-CM

## 2016-11-17 DIAGNOSIS — C19 Malignant neoplasm of rectosigmoid junction: Secondary | ICD-10-CM

## 2016-11-17 DIAGNOSIS — D509 Iron deficiency anemia, unspecified: Secondary | ICD-10-CM

## 2016-11-17 DIAGNOSIS — C20 Malignant neoplasm of rectum: Secondary | ICD-10-CM

## 2016-11-17 DIAGNOSIS — L03019 Cellulitis of unspecified finger: Secondary | ICD-10-CM

## 2016-11-17 DIAGNOSIS — C786 Secondary malignant neoplasm of retroperitoneum and peritoneum: Secondary | ICD-10-CM

## 2016-11-17 DIAGNOSIS — Z5112 Encounter for antineoplastic immunotherapy: Secondary | ICD-10-CM

## 2016-11-17 DIAGNOSIS — R600 Localized edema: Secondary | ICD-10-CM

## 2016-11-17 DIAGNOSIS — E876 Hypokalemia: Secondary | ICD-10-CM

## 2016-11-17 DIAGNOSIS — M549 Dorsalgia, unspecified: Secondary | ICD-10-CM

## 2016-11-17 DIAGNOSIS — D473 Essential (hemorrhagic) thrombocythemia: Secondary | ICD-10-CM

## 2016-11-17 LAB — URINALYSIS, MICROSCOPIC - CHCC
BILIRUBIN (URINE): NEGATIVE
Glucose: NEGATIVE mg/dL
Ketones: NEGATIVE mg/dL
NITRITE: NEGATIVE
Protein: 30 mg/dL
Specific Gravity, Urine: 1.015 (ref 1.003–1.035)
UROBILINOGEN UR: 0.2 mg/dL (ref 0.2–1)
pH: 6 (ref 4.6–8.0)

## 2016-11-17 LAB — COMPREHENSIVE METABOLIC PANEL
ALBUMIN: 2.8 g/dL — AB (ref 3.5–5.0)
ALK PHOS: 172 U/L — AB (ref 40–150)
ALT: 12 U/L (ref 0–55)
AST: 15 U/L (ref 5–34)
Anion Gap: 7 mEq/L (ref 3–11)
BILIRUBIN TOTAL: 0.34 mg/dL (ref 0.20–1.20)
BUN: 11.8 mg/dL (ref 7.0–26.0)
CALCIUM: 8.9 mg/dL (ref 8.4–10.4)
CO2: 26 meq/L (ref 22–29)
CREATININE: 0.5 mg/dL — AB (ref 0.6–1.1)
Chloride: 104 mEq/L (ref 98–109)
EGFR: >90 mL/min/{1.73_m2} (ref >90)
Glucose: 83 mg/dl (ref 70–140)
Potassium: 4 mEq/L (ref 3.5–5.1)
Sodium: 137 mEq/L (ref 136–145)
TOTAL PROTEIN: 6 g/dL — AB (ref 6.4–8.3)

## 2016-11-17 LAB — CBC WITH DIFFERENTIAL/PLATELET
BASO%: 0.7 % (ref 0.0–2.0)
Basophils Absolute: 0.1 10*3/uL (ref 0.0–0.1)
EOS ABS: 0.3 10*3/uL (ref 0.0–0.5)
EOS%: 3.4 % (ref 0.0–7.0)
HEMATOCRIT: 33.8 % — AB (ref 34.8–46.6)
HEMOGLOBIN: 10.9 g/dL — AB (ref 11.6–15.9)
LYMPH#: 1.1 10*3/uL (ref 0.9–3.3)
LYMPH%: 14.3 % (ref 14.0–49.7)
MCH: 25.5 pg (ref 25.1–34.0)
MCHC: 32.1 g/dL (ref 31.5–36.0)
MCV: 79.4 fL — ABNORMAL LOW (ref 79.5–101.0)
MONO#: 0.7 10*3/uL (ref 0.1–0.9)
MONO%: 9.1 % (ref 0.0–14.0)
NEUT%: 72.5 % (ref 38.4–76.8)
NEUTROS ABS: 5.6 10*3/uL (ref 1.5–6.5)
PLATELETS: 488 10*3/uL — AB (ref 145–400)
RBC: 4.26 10*6/uL (ref 3.70–5.45)
RDW: 20.4 % — ABNORMAL HIGH (ref 11.2–14.5)
WBC: 7.8 10*3/uL (ref 3.9–10.3)

## 2016-11-17 LAB — MAGNESIUM: Magnesium: 1.9 mg/dl (ref 1.5–2.5)

## 2016-11-17 MED ORDER — ACETAMINOPHEN 325 MG PO TABS
650.0000 mg | ORAL_TABLET | Freq: Once | ORAL | Status: AC
  Administered 2016-11-17: 650 mg via ORAL

## 2016-11-17 MED ORDER — ACETAMINOPHEN 325 MG PO TABS
ORAL_TABLET | ORAL | Status: AC
  Filled 2016-11-17: qty 2

## 2016-11-17 MED ORDER — SODIUM CHLORIDE 0.9 % IV SOLN
Freq: Once | INTRAVENOUS | Status: AC
  Administered 2016-11-17: 13:00:00 via INTRAVENOUS

## 2016-11-17 MED ORDER — SODIUM CHLORIDE 0.9 % IJ SOLN
10.0000 mL | INTRAMUSCULAR | Status: AC | PRN
  Administered 2016-11-17 (×2): 10 mL via INTRAVENOUS
  Filled 2016-11-17: qty 10

## 2016-11-17 MED ORDER — FLUTICASONE PROPIONATE 0.05 % EX CREA: TOPICAL_CREAM | Freq: Two times a day (BID) | CUTANEOUS | 1 refills | Status: DC

## 2016-11-17 MED ORDER — HEPARIN SOD (PORK) LOCK FLUSH 100 UNIT/ML IV SOLN
500.0000 [IU] | Freq: Once | INTRAVENOUS | Status: AC | PRN
  Administered 2016-11-17: 500 [IU]
  Filled 2016-11-17: qty 5

## 2016-11-17 MED ORDER — PANITUMUMAB CHEMO INJECTION 100 MG/5ML
5.5000 mg/kg | Freq: Once | INTRAVENOUS | Status: AC
  Administered 2016-11-17: 300 mg via INTRAVENOUS
  Filled 2016-11-17: qty 15

## 2016-11-17 MED ORDER — SODIUM CHLORIDE 0.9% FLUSH
10.0000 mL | INTRAVENOUS | Status: DC | PRN
  Filled 2016-11-17: qty 10

## 2016-11-17 MED ORDER — FERROUS SULFATE 325 (65 FE) MG PO TBEC: 325.0000 mg | DELAYED_RELEASE_TABLET | Freq: Every day | ORAL | 0 refills | Status: AC

## 2016-11-17 NOTE — Telephone Encounter (Signed)
Per 2/19 LOS and staff message I have scheduled appts. Gave calendar and notified the scheduler

## 2016-11-17 NOTE — Telephone Encounter (Signed)
Appointments scheduled per 2/19 LOS. Patient given AVS report and calendars with future scheduled appointments. °

## 2016-11-17 NOTE — Addendum Note (Signed)
Addended by: Brien Few on: 11/17/2016 12:07 PM   Modules accepted: Orders

## 2016-11-17 NOTE — Patient Instructions (Signed)
[facility] Discharge Instructions for Patients Receiving Chemotherapy  Today you received the following chemotherapy agents Vectibix  To help prevent nausea and vomiting after your treatment, we encourage you to take your nausea medication as directed.   If you develop nausea and vomiting that is not controlled by your nausea medication, call the clinic.   BELOW ARE SYMPTOMS THAT SHOULD BE REPORTED IMMEDIATELY:  *FEVER GREATER THAN 100.5 F  *CHILLS WITH OR WITHOUT FEVER  NAUSEA AND VOMITING THAT IS NOT CONTROLLED WITH YOUR NAUSEA MEDICATION  *UNUSUAL SHORTNESS OF BREATH  *UNUSUAL BRUISING OR BLEEDING  TENDERNESS IN MOUTH AND THROAT WITH OR WITHOUT PRESENCE OF ULCERS  *URINARY PROBLEMS  *BOWEL PROBLEMS  UNUSUAL RASH Items with * indicate a potential emergency and should be followed up as soon as possible.  Feel free to call the clinic you have any questions or concerns. The clinic phone number is [phone].  Please show the CHEMO ALERT CARD at check-in to the Emergency Department and triage nurse.   

## 2016-11-17 NOTE — Patient Instructions (Signed)

## 2016-11-17 NOTE — Progress Notes (Signed)
[facility] OFFICE PROGRESS NOTE   Diagnosis: Colon cancer  INTERVAL HISTORY:   Carolyn Barton returns as scheduled. She continues every other week Xeloda. No mouth sores. One episode of diarrhea while on Xeloda. She has pruritus associated with the scabbed area over the parietal scalp. She continues to have linear ulcerations at the fingertips. She has noted increased leg edema. She was last treated with panitumumab on 10/27/2016.  Objective:  Vital signs in last 24 hours:  Blood pressure 117/72, pulse 76, temperature 98.5 F (36.9 C), temperature source Oral, resp. rate 16, height 5\' 5"  (1.651 m), weight 124 lb (56.2 kg), SpO2 100 %.    HEENT: No thrush or ulcers Resp: Lungs clear bilaterally Cardio: Regular rate and rhythm GI: No hepatosplenomegaly, no mass, left lower quadrant colostomy Vascular: 1+ pitting edema at the lower leg and ankle bilaterally  Skin: 4 cm scab at the parietal scalp, few areas of paronychia at the fingers   Portacath/PICC-without erythema  Lab Results:  Lab Results  Component Value Date   WBC 7.8 11/17/2016   HGB 10.9 (L) 11/17/2016   HCT 33.8 (L) 11/17/2016   MCV 79.4 (L) 11/17/2016   PLT 488 (H) 11/17/2016   NEUTROABS 5.6 11/17/2016   CEA on 10/27/2016-less than 1  Medications: I have reviewed the patient's current medications.  Assessment/Plan: 1. Metastatic colorectal cancer-status post an exploratory laparotomy 03/03/2016 revealing a proximal rectal mass, peritoneal carcinomatosis, and liver metastases  Biopsy of peritoneal nodules 03/03/2016 confirmed metastatic adenocarcinoma consistent with a colon primary  Foundation 1 testing-MSI-stable, tumor mutation burden-low, no BRAF NRAS or KRAS mutation  Cycle 1 FOLFIRI/PANITUMUMAB 04/14/2016  Cycle 2 FOLFIRI/panitumumab 04/28/2016  Cycle 3 FOLFIRI/panitumumab 05/12/2016  Cycle 4 FOLFIRI/panitumumab 05/27/2016  Cycle 5 FOLFIRI/panitumumab 06/09/2016  Restaging CT  abdomen/pelvis 06/20/2016-no evidence of disease progression, decreased left hepatic lesion  Cycle 6 FOLFIRI/panitumumab 06/23/2016  Cycle 7 FOLFIRI/panitumumab 07/07/2016  Cycle 8 FOLFIRI/panitumumab 07/21/2016  Cycle 9 FOLFIRI/PANITUMUMAB 08/04/2016  Cycle 10 FOLFIRI/panitumumab 08/18/2016  Restaging CT 08/29/2016 with interval decrease in the size of the medial segment left liver lesion. Lesion identified previously in the rectosigmoid colon not evident on the current study.  5-FU/PANITUMUMAB 09/01/2016  Xeloda 7 days on/7 days off and PANITUMUMAB every 3 weeks 09/15/2016 (awaiting insurance approval for Xeloda 09/15/2016)  2.History of aBowel obstruction secondary to #1  3. Chronic back pain  4. Essential thrombocytosis  5. Early obstruction of the left ureter noted at the time of surgery 03/03/2016-Dr. Tannenbaum placed a left double-J stent 03/18/2016  6. Abdominal wall cellulitis 03/10/2016, blood cultures positive for coagulase negative staphylococcus-methicillin-resistant, status post treatment with vancomycin and Bactrim  7. Tachycardia-persistent following discharge from the hospital, likely related to anemia and deconditioning  CT chest 04/04/2016-negative for pulmonary embolism.  8. Port-A-Cath placement 04/10/2016, interventional radiology  9. Iron deficiency anemia. Feraheme 11/05/2015 , 11/12/2015,and 04/14/2016-persistent anemia and red cell microcytosis, oral iron resumed 11/17/2016  10. Pain/tenderness left posterior iliac region-resolved  11. Hypokalemia-started on potassium replacement 06/09/2016  12. Skin rash and paronychia secondary to PANITUMUMAB.    Disposition:  Her overall status appears unchanged. We prescribed fluticasone for the scalp rash. The lower extremity edema may be related to hypoalbuminemia or the pelvic tumor burden. I have a low clinical suspicion for a deep vein thrombosis. She will elevate the legs and  try support stockings. We'll check a urinalysis to rule out significant hematuria.  Carolyn Barton will resume oral iron therapy.  She will return for an office visit and  panitumumab in 3 weeks. We will plan for a restaging CT within the next few months. She is scheduled to see Dr. Brantley Stage in March.  25 minutes were spent with the patient today. The majority of the time was used for counseling and coordination of care.  [name] Coder, MD  11/17/2016  11:24 AM

## 2016-12-07 ENCOUNTER — Other Ambulatory Visit: Payer: Self-pay | Admitting: Oncology

## 2016-12-08 ENCOUNTER — Ambulatory Visit (HOSPITAL_BASED_OUTPATIENT_CLINIC_OR_DEPARTMENT_OTHER): Payer: BLUE CROSS/BLUE SHIELD

## 2016-12-08 ENCOUNTER — Other Ambulatory Visit (HOSPITAL_BASED_OUTPATIENT_CLINIC_OR_DEPARTMENT_OTHER): Payer: BLUE CROSS/BLUE SHIELD

## 2016-12-08 ENCOUNTER — Ambulatory Visit (HOSPITAL_BASED_OUTPATIENT_CLINIC_OR_DEPARTMENT_OTHER): Payer: BLUE CROSS/BLUE SHIELD | Admitting: Oncology

## 2016-12-08 ENCOUNTER — Ambulatory Visit: Payer: BLUE CROSS/BLUE SHIELD

## 2016-12-08 ENCOUNTER — Telehealth: Payer: Self-pay | Admitting: Oncology

## 2016-12-08 DIAGNOSIS — C20 Malignant neoplasm of rectum: Secondary | ICD-10-CM | POA: Diagnosis not present

## 2016-12-08 DIAGNOSIS — C19 Malignant neoplasm of rectosigmoid junction: Secondary | ICD-10-CM

## 2016-12-08 DIAGNOSIS — D509 Iron deficiency anemia, unspecified: Secondary | ICD-10-CM | POA: Diagnosis not present

## 2016-12-08 DIAGNOSIS — Z5112 Encounter for antineoplastic immunotherapy: Secondary | ICD-10-CM | POA: Diagnosis not present

## 2016-12-08 DIAGNOSIS — D473 Essential (hemorrhagic) thrombocythemia: Secondary | ICD-10-CM | POA: Diagnosis not present

## 2016-12-08 DIAGNOSIS — C787 Secondary malignant neoplasm of liver and intrahepatic bile duct: Secondary | ICD-10-CM

## 2016-12-08 DIAGNOSIS — E876 Hypokalemia: Secondary | ICD-10-CM

## 2016-12-08 DIAGNOSIS — C786 Secondary malignant neoplasm of retroperitoneum and peritoneum: Secondary | ICD-10-CM | POA: Diagnosis not present

## 2016-12-08 LAB — COMPREHENSIVE METABOLIC PANEL
ALT: 13 U/L (ref 0–55)
ANION GAP: 6 meq/L (ref 3–11)
AST: 23 U/L (ref 5–34)
Albumin: 3 g/dL — ABNORMAL LOW (ref 3.5–5.0)
Alkaline Phosphatase: 139 U/L (ref 40–150)
BILIRUBIN TOTAL: 0.32 mg/dL (ref 0.20–1.20)
BUN: 10.7 mg/dL (ref 7.0–26.0)
CALCIUM: 8.8 mg/dL (ref 8.4–10.4)
CHLORIDE: 107 meq/L (ref 98–109)
CO2: 25 meq/L (ref 22–29)
CREATININE: 0.5 mg/dL — AB (ref 0.6–1.1)
Glucose: 82 mg/dl (ref 70–140)
Potassium: 3.9 mEq/L (ref 3.5–5.1)
Sodium: 139 mEq/L (ref 136–145)
Total Protein: 5.8 g/dL — ABNORMAL LOW (ref 6.4–8.3)

## 2016-12-08 LAB — CBC WITH DIFFERENTIAL/PLATELET
BASO%: 0.4 % (ref 0.0–2.0)
BASOS ABS: 0 10*3/uL (ref 0.0–0.1)
EOS ABS: 0.4 10*3/uL (ref 0.0–0.5)
EOS%: 6.9 % (ref 0.0–7.0)
HEMATOCRIT: 33.6 % — AB (ref 34.8–46.6)
HGB: 10.7 g/dL — ABNORMAL LOW (ref 11.6–15.9)
LYMPH#: 1.2 10*3/uL (ref 0.9–3.3)
LYMPH%: 22.1 % (ref 14.0–49.7)
MCH: 25.3 pg (ref 25.1–34.0)
MCHC: 31.9 g/dL (ref 31.5–36.0)
MCV: 79.2 fL — AB (ref 79.5–101.0)
MONO#: 0.6 10*3/uL (ref 0.1–0.9)
MONO%: 11.3 % (ref 0.0–14.0)
NEUT#: 3.3 10*3/uL (ref 1.5–6.5)
NEUT%: 59.3 % (ref 38.4–76.8)
PLATELETS: 454 10*3/uL — AB (ref 145–400)
RBC: 4.24 10*6/uL (ref 3.70–5.45)
RDW: 21 % — ABNORMAL HIGH (ref 11.2–14.5)
WBC: 5.5 10*3/uL (ref 3.9–10.3)

## 2016-12-08 LAB — CEA (IN HOUSE-CHCC)

## 2016-12-08 LAB — MAGNESIUM: MAGNESIUM: 2 mg/dL (ref 1.5–2.5)

## 2016-12-08 MED ORDER — HEPARIN SOD (PORK) LOCK FLUSH 100 UNIT/ML IV SOLN
500.0000 [IU] | Freq: Once | INTRAVENOUS | Status: AC | PRN
  Administered 2016-12-08: 500 [IU]
  Filled 2016-12-08: qty 5

## 2016-12-08 MED ORDER — MINOCYCLINE HCL 100 MG PO CAPS: 100.0000 mg | ORAL_CAPSULE | Freq: Two times a day (BID) | ORAL | 11 refills | Status: DC

## 2016-12-08 MED ORDER — PANITUMUMAB CHEMO INJECTION 100 MG/5ML
5.6000 mg/kg | Freq: Once | INTRAVENOUS | Status: AC
  Administered 2016-12-08: 300 mg via INTRAVENOUS
  Filled 2016-12-08: qty 15

## 2016-12-08 MED ORDER — SODIUM CHLORIDE 0.9 % IV SOLN
Freq: Once | INTRAVENOUS | Status: AC
  Administered 2016-12-08: 12:00:00 via INTRAVENOUS

## 2016-12-08 MED ORDER — SODIUM CHLORIDE 0.9% FLUSH
10.0000 mL | INTRAVENOUS | Status: DC | PRN
  Administered 2016-12-08: 10 mL
  Filled 2016-12-08: qty 10

## 2016-12-08 NOTE — Telephone Encounter (Signed)
Appointments scheduled per 3/12 LOS. Patient given AVS report and calendars with future scheduled appointments. Patient will take water based contrast before CT scan appointment.

## 2016-12-08 NOTE — Progress Notes (Signed)
  [place] OFFICE PROGRESS NOTE   Diagnosis: Colon cancer  INTERVAL HISTORY:   [name] returns as scheduled. She was last treated with panitumumab 11/17/2016. She continues every other week Xeloda. The skin continues to be dry. She reports increased pain at the right lower back. Good energy level.   Objective:  Vital signs in last 24 hours:  Blood pressure 126/85, pulse 78, temperature 98 F (36.7 C), temperature source Oral, resp. rate 18, height '5\' 5"'$  (1.651 m), weight 126 lb 11.2 oz (57.5 kg), SpO2 100 %.    HEENT: No thrush or ulcers Resp: Lungs clear bilaterally Cardio: Regular rate and rhythm GI: No hepatomegaly, left lower quadrant colostomy, nontender Vascular: Trace edema at the low leg bilaterally  Skin: Scabbed lesion at the parietal scalp-improved, few areas of paronychia at the fingers, no skin breakdown at the feet   Portacath/PICC-without erythema  Lab Results:  Lab Results  Component Value Date   WBC 5.5 12/08/2016   HGB 10.7 (L) 12/08/2016   HCT 33.6 (L) 12/08/2016   MCV 79.2 (L) 12/08/2016   PLT 454 (H) 12/08/2016   NEUTROABS 3.3 12/08/2016   Potassium 3.9, creatinine 0.5, magnesium 2.0 CEA on 10/27/2016-less than 1  Medications: I have reviewed the patient's current medications.  Assessment/Plan: 1. Metastatic colorectal cancer-status post an exploratory laparotomy 03/03/2016 revealing a proximal rectal mass, peritoneal carcinomatosis, and liver metastases  Biopsy of peritoneal nodules 03/03/2016 confirmed metastatic adenocarcinoma consistent with a colon primary  Foundation 1 testing-MSI-stable, tumor mutation burden-low, no BRAF NRAS or KRAS mutation  Cycle 1 FOLFIRI/PANITUMUMAB 04/14/2016  Cycle 2 FOLFIRI/panitumumab 04/28/2016  Cycle 3 FOLFIRI/panitumumab 05/12/2016  Cycle 4 FOLFIRI/panitumumab 05/27/2016  Cycle 5 FOLFIRI/panitumumab 06/09/2016  Restaging CT abdomen/pelvis 06/20/2016-no evidence of disease  progression, decreased left hepatic lesion  Cycle 6 FOLFIRI/panitumumab 06/23/2016  Cycle 7 FOLFIRI/panitumumab 07/07/2016  Cycle 8 FOLFIRI/panitumumab 07/21/2016  Cycle 9 FOLFIRI/PANITUMUMAB 08/04/2016  Cycle 10 FOLFIRI/panitumumab 08/18/2016  Restaging CT 08/29/2016 with interval decrease in the size of the medial segment left liver lesion. Lesion identified previously in the rectosigmoid colon not evident on the current study.  5-FU/PANITUMUMAB 09/01/2016  Xeloda 7 days on/7 days off and PANITUMUMAB every 3 weeks 09/15/2016 (awaiting insurance approval for Xeloda 09/15/2016)  2.History of aBowel obstruction secondary to #1  3. Chronic back pain  4. Essential thrombocytosis  5. Early obstruction of the left ureter noted at the time of surgery 03/03/2016-Dr. Tannenbaum placed a left double-J stent 03/18/2016  6. Abdominal wall cellulitis 03/10/2016, blood cultures positive for coagulase negative staphylococcus-methicillin-resistant, status post treatment with vancomycin and Bactrim  7. Tachycardia-persistent following discharge from the hospital, likely related to anemia and deconditioning  CT chest 04/04/2016-negative for pulmonary embolism.  8. Port-A-Cath placement 04/10/2016, interventional radiology  9. Iron deficiency anemia. Feraheme 11/05/2015 , 11/12/2015,and 04/14/2016-persistent anemia and red cell microcytosis, oral iron resumed 11/17/2016  10. Pain/tenderness left posterior iliac region-resolved  11. Hypokalemia-started on potassium replacement 06/09/2016  12. Skin rashand paronychiasecondary to PANITUMUMAB.    Disposition:  Her overall status appears unchanged. The low back pain is most likely related to the chronic pain for which she takes methadone. The plan is to continue Xeloda/panitumumab. She will undergo a restaging CT prior to an office visit in 3 weeks.  Carolyn Coder, MD  12/08/2016  10:54 AM

## 2016-12-08 NOTE — Patient Instructions (Signed)

## 2016-12-16 ENCOUNTER — Other Ambulatory Visit: Payer: Self-pay | Admitting: *Deleted

## 2016-12-16 ENCOUNTER — Telehealth: Payer: Self-pay | Admitting: *Deleted

## 2016-12-16 NOTE — Telephone Encounter (Signed)
Message received from patient stating that she has not heard about scheduling CT scan.  Message sent to Carolyn Barton for PA for CT.  CT approved and call placed to central scheduling to call pt to schedule scan.

## 2016-12-24 ENCOUNTER — Other Ambulatory Visit: Payer: Self-pay | Admitting: *Deleted

## 2016-12-24 MED ORDER — CAPECITABINE 500 MG PO TABS: ORAL_TABLET | ORAL | 1 refills | Status: DC

## 2016-12-25 ENCOUNTER — Ambulatory Visit (HOSPITAL_COMMUNITY)
Admission: RE | Admit: 2016-12-25 | Discharge: 2016-12-25 | Disposition: A | Discharge status: Home / Self Care | Payer: BLUE CROSS/BLUE SHIELD | Source: Ambulatory Visit | Attending: Oncology | Admitting: Oncology

## 2016-12-25 DIAGNOSIS — Z700 Counseling related to sexual attitude: Secondary | ICD-10-CM | POA: Insufficient documentation

## 2016-12-25 DIAGNOSIS — C787 Secondary malignant neoplasm of liver and intrahepatic bile duct: Secondary | ICD-10-CM | POA: Diagnosis not present

## 2016-12-25 DIAGNOSIS — K229 Disease of esophagus, unspecified: Secondary | ICD-10-CM | POA: Diagnosis not present

## 2016-12-25 DIAGNOSIS — K449 Diaphragmatic hernia without obstruction or gangrene: Secondary | ICD-10-CM | POA: Insufficient documentation

## 2016-12-25 DIAGNOSIS — C19 Malignant neoplasm of rectosigmoid junction: Secondary | ICD-10-CM

## 2016-12-25 DIAGNOSIS — Q638 Other specified congenital malformations of kidney: Secondary | ICD-10-CM | POA: Insufficient documentation

## 2016-12-25 DIAGNOSIS — C785 Secondary malignant neoplasm of large intestine and rectum: Secondary | ICD-10-CM | POA: Insufficient documentation

## 2016-12-25 MED ORDER — IOPAMIDOL (ISOVUE-300) INJECTION 61%
INTRAVENOUS | Status: AC
  Administered 2016-12-25: 30 mL via ORAL
  Filled 2016-12-25: qty 30

## 2016-12-25 MED ORDER — IOPAMIDOL (ISOVUE-300) INJECTION 61%
INTRAVENOUS | Status: AC
  Administered 2016-12-25: 100 mL
  Filled 2016-12-25: qty 100

## 2016-12-25 MED ORDER — IOPAMIDOL (ISOVUE-300) INJECTION 61%
30.0000 mL | Freq: Once | INTRAVENOUS | Status: AC | PRN
  Administered 2016-12-25: 30 mL via ORAL

## 2016-12-28 ENCOUNTER — Other Ambulatory Visit: Payer: Self-pay | Admitting: Oncology

## 2016-12-29 ENCOUNTER — Ambulatory Visit (HOSPITAL_BASED_OUTPATIENT_CLINIC_OR_DEPARTMENT_OTHER): Payer: BLUE CROSS/BLUE SHIELD

## 2016-12-29 ENCOUNTER — Telehealth: Payer: Self-pay | Admitting: Oncology

## 2016-12-29 ENCOUNTER — Other Ambulatory Visit (HOSPITAL_BASED_OUTPATIENT_CLINIC_OR_DEPARTMENT_OTHER): Payer: BLUE CROSS/BLUE SHIELD

## 2016-12-29 ENCOUNTER — Ambulatory Visit (HOSPITAL_BASED_OUTPATIENT_CLINIC_OR_DEPARTMENT_OTHER): Payer: BLUE CROSS/BLUE SHIELD | Admitting: Oncology

## 2016-12-29 ENCOUNTER — Ambulatory Visit: Payer: BLUE CROSS/BLUE SHIELD

## 2016-12-29 VITALS — BP 120/74 | HR 84 | Temp 98.2°F | Resp 20

## 2016-12-29 VITALS — BP 128/72 | HR 82 | Temp 98.2°F | Resp 16 | Wt 127.6 lb

## 2016-12-29 DIAGNOSIS — C20 Malignant neoplasm of rectum: Secondary | ICD-10-CM

## 2016-12-29 DIAGNOSIS — L98499 Non-pressure chronic ulcer of skin of other sites with unspecified severity: Secondary | ICD-10-CM

## 2016-12-29 DIAGNOSIS — Z95828 Presence of other vascular implants and grafts: Secondary | ICD-10-CM

## 2016-12-29 DIAGNOSIS — Z452 Encounter for adjustment and management of vascular access device: Secondary | ICD-10-CM | POA: Diagnosis not present

## 2016-12-29 DIAGNOSIS — C787 Secondary malignant neoplasm of liver and intrahepatic bile duct: Secondary | ICD-10-CM | POA: Diagnosis not present

## 2016-12-29 DIAGNOSIS — Z5112 Encounter for antineoplastic immunotherapy: Secondary | ICD-10-CM | POA: Diagnosis not present

## 2016-12-29 DIAGNOSIS — R600 Localized edema: Secondary | ICD-10-CM

## 2016-12-29 DIAGNOSIS — C786 Secondary malignant neoplasm of retroperitoneum and peritoneum: Secondary | ICD-10-CM

## 2016-12-29 DIAGNOSIS — C19 Malignant neoplasm of rectosigmoid junction: Secondary | ICD-10-CM

## 2016-12-29 LAB — CBC WITH DIFFERENTIAL/PLATELET
BASO%: 0.7 % (ref 0.0–2.0)
BASOS ABS: 0.1 10*3/uL (ref 0.0–0.1)
EOS ABS: 0.4 10*3/uL (ref 0.0–0.5)
EOS%: 5.7 % (ref 0.0–7.0)
HCT: 37.9 % (ref 34.8–46.6)
HGB: 11.7 g/dL (ref 11.6–15.9)
LYMPH#: 1.1 10*3/uL (ref 0.9–3.3)
LYMPH%: 15.2 % (ref 14.0–49.7)
MCH: 25.2 pg (ref 25.1–34.0)
MCHC: 30.9 g/dL — AB (ref 31.5–36.0)
MCV: 81.5 fL (ref 79.5–101.0)
MONO#: 0.6 10*3/uL (ref 0.1–0.9)
MONO%: 8 % (ref 0.0–14.0)
NEUT#: 5.2 10*3/uL (ref 1.5–6.5)
NEUT%: 70.4 % (ref 38.4–76.8)
Platelets: 503 10*3/uL — ABNORMAL HIGH (ref 145–400)
RBC: 4.65 10*6/uL (ref 3.70–5.45)
RDW: 20.4 % — ABNORMAL HIGH (ref 11.2–14.5)
WBC: 7.4 10*3/uL (ref 3.9–10.3)

## 2016-12-29 LAB — COMPREHENSIVE METABOLIC PANEL
ALK PHOS: 119 U/L (ref 40–150)
ALT: 13 U/L (ref 0–55)
ANION GAP: 6 meq/L (ref 3–11)
AST: 18 U/L (ref 5–34)
Albumin: 2.9 g/dL — ABNORMAL LOW (ref 3.5–5.0)
BUN: 11.1 mg/dL (ref 7.0–26.0)
CALCIUM: 9.2 mg/dL (ref 8.4–10.4)
CHLORIDE: 105 meq/L (ref 98–109)
CO2: 29 mEq/L (ref 22–29)
Creatinine: 0.6 mg/dL (ref 0.6–1.1)
Glucose: 70 mg/dl (ref 70–140)
POTASSIUM: 4.6 meq/L (ref 3.5–5.1)
Sodium: 140 mEq/L (ref 136–145)
Total Bilirubin: <0.22 mg/dL (ref 0.20–1.20)
Total Protein: 6.3 g/dL — ABNORMAL LOW (ref 6.4–8.3)

## 2016-12-29 LAB — MAGNESIUM: MAGNESIUM: 2 mg/dL (ref 1.5–2.5)

## 2016-12-29 MED ORDER — HEPARIN SOD (PORK) LOCK FLUSH 100 UNIT/ML IV SOLN
500.0000 [IU] | Freq: Once | INTRAVENOUS | Status: AC | PRN
  Administered 2016-12-29: 500 [IU]
  Filled 2016-12-29: qty 5

## 2016-12-29 MED ORDER — SODIUM CHLORIDE 0.9 % IV SOLN
Freq: Once | INTRAVENOUS | Status: AC
  Administered 2016-12-29: 12:00:00 via INTRAVENOUS

## 2016-12-29 MED ORDER — SODIUM CHLORIDE 0.9 % IV SOLN
5.3000 mg/kg | Freq: Once | INTRAVENOUS | Status: AC
  Administered 2016-12-29: 300 mg via INTRAVENOUS
  Filled 2016-12-29: qty 15

## 2016-12-29 MED ORDER — ALTEPLASE 2 MG IJ SOLR
2.0000 mg | Freq: Once | INTRAMUSCULAR | Status: AC | PRN
  Administered 2016-12-29: 2 mg
  Filled 2016-12-29: qty 2

## 2016-12-29 MED ORDER — HEPARIN SOD (PORK) LOCK FLUSH 100 UNIT/ML IV SOLN
500.0000 [IU] | Freq: Once | INTRAVENOUS | Status: DC
  Filled 2016-12-29: qty 5

## 2016-12-29 MED ORDER — SODIUM CHLORIDE 0.9% FLUSH
10.0000 mL | INTRAVENOUS | Status: DC | PRN
  Administered 2016-12-29: 10 mL via INTRAVENOUS
  Filled 2016-12-29: qty 10

## 2016-12-29 MED ORDER — SODIUM CHLORIDE 0.9% FLUSH
10.0000 mL | INTRAVENOUS | Status: DC | PRN
  Administered 2016-12-29: 10 mL
  Filled 2016-12-29: qty 10

## 2016-12-29 NOTE — Telephone Encounter (Signed)
Appointments scheduled per 4.2.18 LOS. Patient given AVS report and calendars with future scheduled appointments. °

## 2016-12-29 NOTE — Patient Instructions (Signed)
[facility] Discharge Instructions for Patients Receiving Chemotherapy  Today you received the following chemotherapy agents Vectibix  To help prevent nausea and vomiting after your treatment, we encourage you to take your nausea medication as directed.   If you develop nausea and vomiting that is not controlled by your nausea medication, call the clinic.   BELOW ARE SYMPTOMS THAT SHOULD BE REPORTED IMMEDIATELY:  *FEVER GREATER THAN 100.5 F  *CHILLS WITH OR WITHOUT FEVER  NAUSEA AND VOMITING THAT IS NOT CONTROLLED WITH YOUR NAUSEA MEDICATION  *UNUSUAL SHORTNESS OF BREATH  *UNUSUAL BRUISING OR BLEEDING  TENDERNESS IN MOUTH AND THROAT WITH OR WITHOUT PRESENCE OF ULCERS  *URINARY PROBLEMS  *BOWEL PROBLEMS  UNUSUAL RASH Items with * indicate a potential emergency and should be followed up as soon as possible.  Feel free to call the clinic you have any questions or concerns. The clinic phone number is [phone].  Please show the CHEMO ALERT CARD at check-in to the Emergency Department and triage nurse.   

## 2016-12-29 NOTE — Progress Notes (Signed)
[place] OFFICE PROGRESS NOTE   Diagnosis: Colon cancer  INTERVAL HISTORY:   Carolyn [facility] returns as scheduled. She continues Xeloda and panitumumab. She will begin another cycle of Xeloda on 01/02/2017. She continues to have lower leg swelling. No mouth sores or diarrhea. She has linear ulcerations at the fingertips. She applies fluticasone to the scalp lesion.  Objective:  Vital signs in last 24 hours:  Blood pressure 128/72, pulse 82, temperature 98.2 F (36.8 C), temperature source Oral, resp. rate 16, weight 127 lb 9.6 oz (57.9 kg), SpO2 100 %.    HEENT: No thrush or ulcers Resp: Lungs clear bilaterally Cardio: Regular rate and rhythm GI: No hepatosplenomegaly, left lower quadrant colostomy, nontender Vascular: Trace pitting edema at the lower leg bilaterally  Skin: Few areas of paronychia at the fingers, scabbed 3-4 7 m lesion at the parietal scalp-improved   Portacath/PICC-without erythema  Lab Results:  Lab Results  Component Value Date   WBC 7.4 12/29/2016   HGB 11.7 12/29/2016   HCT 37.9 12/29/2016   MCV 81.5 12/29/2016   PLT 503 (H) 12/29/2016   NEUTROABS 5.2 12/29/2016    Imaging:  No results found.  Medications: I have reviewed the patient's current medications.  Assessment/Plan: 1. Metastatic colorectal cancer-status post an exploratory laparotomy 03/03/2016 revealing a proximal rectal mass, peritoneal carcinomatosis, and liver metastases  Biopsy of peritoneal nodules 03/03/2016 confirmed metastatic adenocarcinoma consistent with a colon primary  Foundation 1 testing-MSI-stable, tumor mutation burden-low, no BRAF NRAS or KRAS mutation  Cycle 1 FOLFIRI/PANITUMUMAB 04/14/2016  Cycle 2 FOLFIRI/panitumumab 04/28/2016  Cycle 3 FOLFIRI/panitumumab 05/12/2016  Cycle 4 FOLFIRI/panitumumab 05/27/2016  Cycle 5 FOLFIRI/panitumumab 06/09/2016  Restaging CT abdomen/pelvis 06/20/2016-no evidence of disease progression, decreased left  hepatic lesion  Cycle 6 FOLFIRI/panitumumab 06/23/2016  Cycle 7 FOLFIRI/panitumumab 07/07/2016  Cycle 8 FOLFIRI/panitumumab 07/21/2016  Cycle 9 FOLFIRI/PANITUMUMAB 08/04/2016  Cycle 10 FOLFIRI/panitumumab 08/18/2016  Restaging CT 08/29/2016 with interval decrease in the size of the medial segment left liver lesion. Lesion identified previously in the rectosigmoid colon not evident on the current study.  5-FU/PANITUMUMAB 09/01/2016  Xeloda 7 days on/7 days off and PANITUMUMAB every 3 weeks 09/15/2016 (awaiting insurance approval for Xeloda 09/15/2016)  CT abdomen/pelvis 12/25/2016-unchanged 5 mm right hepatic lesion, resolution of medial segment left liver lesion, no new liver lesion. Residual soft tissue fullness in the sigmoid , persistent distal esophageal wall thickening  2.History of aBowel obstruction secondary to #1  3. Chronic back pain  4. Essential thrombocytosis  5. Early obstruction of the left ureter noted at the time of surgery 03/03/2016-Dr. Tannenbaum placed a left double-J stent 03/18/2016  6. Abdominal wall cellulitis 03/10/2016, blood cultures positive for coagulase negative staphylococcus-methicillin-resistant, status post treatment with vancomycin and Bactrim  7. Tachycardia-persistent following discharge from the hospital, likely related to anemia and deconditioning  CT chest 04/04/2016-negative for pulmonary embolism.  8. Port-A-Cath placement 04/10/2016, interventional radiology  9. Iron deficiency anemia. Feraheme 11/05/2015 , 11/12/2015,and 04/14/2016-persistent anemia and red cell microcytosis, oral iron resumed 11/17/2016  10. Pain/tenderness left posterior iliac region-resolved  11. Hypokalemia-started on potassium replacement 06/09/2016  12. Skin rashand paronychiasecondary to PANITUMUMAB.     Disposition:  She appears stable. The restaging CT reveals no evidence of disease progression. The CEA remains normal. The  plan is to continue Xeloda/panitumumab. She would like to consider reversal of the colostomy. She understands this would not be a curative procedure. She understands and says it would improve her quality of life significantly to not have the colostomy.  I will contact Dr. Brantley Stage to get his opinion.  She will try elevation of the legs for the edema. The edema may be related to methadone. She will return for an office visit and panitumumab in 3 weeks. I will present her case at the GI tumor conference within the next few weeks.   Carolyn Coder, MD  12/29/2016  11:29 AM

## 2017-01-09 ENCOUNTER — Telehealth: Payer: Self-pay | Admitting: *Deleted

## 2017-01-09 NOTE — Telephone Encounter (Signed)
Message from pt reporting swelling and pain around colostomy. She reports brown drainage "coming from beneath the stoma." Pt denies skin irritation. She denies constipation and reports she is having stool per ostomy. Pt also noted BM from rectum earlier this week.  Instructed her to call surgeon's office to have colostomy site evaluated. She voiced understanding.

## 2017-01-14 ENCOUNTER — Other Ambulatory Visit: Payer: Self-pay | Admitting: Surgery

## 2017-01-14 DIAGNOSIS — Z9889 Other specified postprocedural states: Secondary | ICD-10-CM

## 2017-01-18 ENCOUNTER — Other Ambulatory Visit: Payer: Self-pay | Admitting: Oncology

## 2017-01-19 ENCOUNTER — Telehealth: Payer: Self-pay | Admitting: Oncology

## 2017-01-19 ENCOUNTER — Ambulatory Visit (HOSPITAL_BASED_OUTPATIENT_CLINIC_OR_DEPARTMENT_OTHER): Payer: BLUE CROSS/BLUE SHIELD

## 2017-01-19 ENCOUNTER — Other Ambulatory Visit (HOSPITAL_BASED_OUTPATIENT_CLINIC_OR_DEPARTMENT_OTHER): Payer: BLUE CROSS/BLUE SHIELD

## 2017-01-19 ENCOUNTER — Ambulatory Visit: Payer: BLUE CROSS/BLUE SHIELD

## 2017-01-19 ENCOUNTER — Ambulatory Visit (HOSPITAL_BASED_OUTPATIENT_CLINIC_OR_DEPARTMENT_OTHER): Payer: BLUE CROSS/BLUE SHIELD | Admitting: Oncology

## 2017-01-19 VITALS — BP 126/84 | HR 74 | Temp 98.3°F | Resp 18 | Ht 65.0 in | Wt 123.8 lb

## 2017-01-19 DIAGNOSIS — R21 Rash and other nonspecific skin eruption: Secondary | ICD-10-CM

## 2017-01-19 DIAGNOSIS — C787 Secondary malignant neoplasm of liver and intrahepatic bile duct: Secondary | ICD-10-CM

## 2017-01-19 DIAGNOSIS — Z5112 Encounter for antineoplastic immunotherapy: Secondary | ICD-10-CM | POA: Diagnosis not present

## 2017-01-19 DIAGNOSIS — C20 Malignant neoplasm of rectum: Secondary | ICD-10-CM

## 2017-01-19 DIAGNOSIS — M549 Dorsalgia, unspecified: Secondary | ICD-10-CM | POA: Diagnosis not present

## 2017-01-19 DIAGNOSIS — C786 Secondary malignant neoplasm of retroperitoneum and peritoneum: Secondary | ICD-10-CM

## 2017-01-19 DIAGNOSIS — C19 Malignant neoplasm of rectosigmoid junction: Secondary | ICD-10-CM

## 2017-01-19 DIAGNOSIS — D473 Essential (hemorrhagic) thrombocythemia: Secondary | ICD-10-CM

## 2017-01-19 DIAGNOSIS — L98499 Non-pressure chronic ulcer of skin of other sites with unspecified severity: Secondary | ICD-10-CM

## 2017-01-19 DIAGNOSIS — Z95828 Presence of other vascular implants and grafts: Secondary | ICD-10-CM

## 2017-01-19 DIAGNOSIS — R600 Localized edema: Secondary | ICD-10-CM

## 2017-01-19 DIAGNOSIS — D509 Iron deficiency anemia, unspecified: Secondary | ICD-10-CM

## 2017-01-19 LAB — CBC WITH DIFFERENTIAL/PLATELET
BASO%: 0.8 % (ref 0.0–2.0)
BASOS ABS: 0.1 10*3/uL (ref 0.0–0.1)
EOS%: 7.9 % — AB (ref 0.0–7.0)
Eosinophils Absolute: 0.5 10*3/uL (ref 0.0–0.5)
HCT: 36.5 % (ref 34.8–46.6)
HEMOGLOBIN: 11.2 g/dL — AB (ref 11.6–15.9)
LYMPH#: 1.4 10*3/uL (ref 0.9–3.3)
LYMPH%: 22.3 % (ref 14.0–49.7)
MCH: 24.9 pg — AB (ref 25.1–34.0)
MCHC: 30.7 g/dL — AB (ref 31.5–36.0)
MCV: 81.1 fL (ref 79.5–101.0)
MONO#: 0.7 10*3/uL (ref 0.1–0.9)
MONO%: 10.4 % (ref 0.0–14.0)
NEUT#: 3.7 10*3/uL (ref 1.5–6.5)
NEUT%: 58.6 % (ref 38.4–76.8)
Platelets: 423 10*3/uL — ABNORMAL HIGH (ref 145–400)
RBC: 4.5 10*6/uL (ref 3.70–5.45)
RDW: 20.2 % — AB (ref 11.2–14.5)
WBC: 6.2 10*3/uL (ref 3.9–10.3)

## 2017-01-19 LAB — COMPREHENSIVE METABOLIC PANEL
ALBUMIN: 2.7 g/dL — AB (ref 3.5–5.0)
ALT: 11 U/L (ref 0–55)
AST: 18 U/L (ref 5–34)
Alkaline Phosphatase: 121 U/L (ref 40–150)
Anion Gap: 8 mEq/L (ref 3–11)
BUN: 12.4 mg/dL (ref 7.0–26.0)
CHLORIDE: 105 meq/L (ref 98–109)
CO2: 27 mEq/L (ref 22–29)
Calcium: 8.6 mg/dL (ref 8.4–10.4)
Creatinine: 0.5 mg/dL — ABNORMAL LOW (ref 0.6–1.1)
EGFR: >90 mL/min/{1.73_m2} (ref >90)
GLUCOSE: 75 mg/dL (ref 70–140)
POTASSIUM: 3.9 meq/L (ref 3.5–5.1)
SODIUM: 140 meq/L (ref 136–145)
Total Bilirubin: <0.22 mg/dL (ref 0.20–1.20)
Total Protein: 5.8 g/dL — ABNORMAL LOW (ref 6.4–8.3)

## 2017-01-19 LAB — MAGNESIUM: Magnesium: 2.1 mg/dl (ref 1.5–2.5)

## 2017-01-19 MED ORDER — PANITUMUMAB CHEMO INJECTION 100 MG/5ML
5.6000 mg/kg | Freq: Once | INTRAVENOUS | Status: AC
  Administered 2017-01-19: 300 mg via INTRAVENOUS
  Filled 2017-01-19: qty 15

## 2017-01-19 MED ORDER — SODIUM CHLORIDE 0.9 % IV SOLN
Freq: Once | INTRAVENOUS | Status: AC
  Administered 2017-01-19: 10:00:00 via INTRAVENOUS

## 2017-01-19 MED ORDER — SODIUM CHLORIDE 0.9 % IJ SOLN
10.0000 mL | INTRAMUSCULAR | Status: AC | PRN
  Administered 2017-01-19: 10 mL
  Filled 2017-01-19: qty 10

## 2017-01-19 MED ORDER — SODIUM CHLORIDE 0.9% FLUSH
10.0000 mL | INTRAVENOUS | Status: DC | PRN
  Administered 2017-01-19: 10 mL
  Filled 2017-01-19: qty 10

## 2017-01-19 MED ORDER — HEPARIN SOD (PORK) LOCK FLUSH 100 UNIT/ML IV SOLN
500.0000 [IU] | Freq: Once | INTRAVENOUS | Status: AC | PRN
  Administered 2017-01-19: 500 [IU]
  Filled 2017-01-19: qty 5

## 2017-01-19 NOTE — Telephone Encounter (Signed)
Gave patient AVS and calender per 4/23 los.  

## 2017-01-19 NOTE — Progress Notes (Signed)
[place] OFFICE PROGRESS NOTE   Diagnosis: Colon cancer  INTERVAL HISTORY:   Carolyn Barton returns as scheduled. She continues Xeloda/panitumumab. She developed pain at the colostomy site last week. She was evaluated at the surgery office and is scheduled for a CT tomorrow. The pain has resolved. The ulcerations at the fingertips have improved. She uses fluticasone on the scalp. Good appetite and energy level.  Objective:  Vital signs in last 24 hours:  Blood pressure 126/84, pulse 74, temperature 98.3 F (36.8 C), temperature source Oral, resp. rate 18, height '5\' 5"'$  (1.651 m), weight 123 lb 12.8 oz (56.2 kg), SpO2 100 %.    HEENT: No thrush or ulcers Resp: Lungs clear bilaterally Cardio: Regular rate and rhythm GI: No hepatosplenomegaly, no tenderness or mass at the left colostomy site. Nontender. Vascular: Trace low leg edema bilaterally  Skin: There is diffuse dryness of the skin. No paronychia. Crusted lesion at the parietal scalp is smaller   Portacath/PICC-without erythema  Lab Results:  Lab Results  Component Value Date   WBC 6.2 01/19/2017   HGB 11.2 (L) 01/19/2017   HCT 36.5 01/19/2017   MCV 81.1 01/19/2017   PLT 423 (H) 01/19/2017   NEUTROABS 3.7 01/19/2017   CEA on 12/08/2016: < 1.0  Medications: I have reviewed the patient's current medications.  Assessment/Plan: 1. Metastatic colorectal cancer-status post an exploratory laparotomy 03/03/2016 revealing a proximal rectal mass, peritoneal carcinomatosis, and liver metastases  Biopsy of peritoneal nodules 03/03/2016 confirmed metastatic adenocarcinoma consistent with a colon primary  Foundation 1 testing-MSI-stable, tumor mutation burden-low, no BRAF NRAS or KRAS mutation  Cycle 1 FOLFIRI/PANITUMUMAB 04/14/2016  Cycle 2 FOLFIRI/panitumumab 04/28/2016  Cycle 3 FOLFIRI/panitumumab 05/12/2016  Cycle 4 FOLFIRI/panitumumab 05/27/2016  Cycle 5 FOLFIRI/panitumumab 06/09/2016  Restaging CT  abdomen/pelvis 06/20/2016-no evidence of disease progression, decreased left hepatic lesion  Cycle 6 FOLFIRI/panitumumab 06/23/2016  Cycle 7 FOLFIRI/panitumumab 07/07/2016  Cycle 8 FOLFIRI/panitumumab 07/21/2016  Cycle 9 FOLFIRI/PANITUMUMAB 08/04/2016  Cycle 10 FOLFIRI/panitumumab 08/18/2016  Restaging CT 08/29/2016 with interval decrease in the size of the medial segment left liver lesion. Lesion identified previously in the rectosigmoid colon not evident on the current study.  5-FU/PANITUMUMAB 09/01/2016  Xeloda 7 days on/7 days off and PANITUMUMAB every 3 weeks 09/15/2016 (awaiting insurance approval for Xeloda 09/15/2016)  CT abdomen/pelvis 12/25/2016-unchanged 5 mm right hepatic lesion, resolution of medial segment left liver lesion, no new liver lesion. Residual soft tissue fullness in the sigmoid , persistent distal esophageal wall thickening  2.History of aBowel obstruction secondary to #1  3. Chronic back pain  4. Essential thrombocytosis  5. Early obstruction of the left ureter noted at the time of surgery 03/03/2016-Dr. Tannenbaum placed a left double-J stent 03/18/2016  6. Abdominal wall cellulitis 03/10/2016, blood cultures positive for coagulase negative staphylococcus-methicillin-resistant, status post treatment with vancomycin and Bactrim  7. Tachycardia-persistent following discharge from the hospital, likely related to anemia and deconditioning  CT chest 04/04/2016-negative for pulmonary embolism.  8. Port-A-Cath placement 04/10/2016, interventional radiology  9. Iron deficiency anemia. Feraheme 11/05/2015 , 11/12/2015,and 04/14/2016-persistent anemia and red cell microcytosis, oral iron resumed 11/17/2016-stable  10. Pain/tenderness left posterior iliac region-resolved  11. Hypokalemia-started on potassium replacement 06/09/2016  12. Skin rashand paronychiasecondary to [facility], she continues minocycline, moisturizers,  and fluticasone    Disposition:  Her overall status appears unchanged. She had pain at the colostomy site last week. She was evaluated by an ostomy nurse and in the surgery office. The pain has resolved. She is scheduled for a CT  tomorrow. She plans to cancel the CT since the pain has resolved and she had a CT last month.  There is no clinical evidence of progressive colon cancer. She will continue Xeloda/panitumumab.  She wants to see Dr. Brantley Stage to discuss resection of the primary tumor and colostomy reversal.  Carolyn Barton will return for an office visit and chemotherapy in 3 weeks.  The lower extremity edema may be related to polypharmacy and hypoalbuminemia. The edema is mild. She continues daily Lasix.    [name] Coder, MD  01/19/2017  8:36 AM

## 2017-01-19 NOTE — Patient Instructions (Signed)
[facility] Discharge Instructions for Patients Receiving Chemotherapy  Today you received the following chemotherapy agents Vectibix  To help prevent nausea and vomiting after your treatment, we encourage you to take your nausea medication as directed.   If you develop nausea and vomiting that is not controlled by your nausea medication, call the clinic.   BELOW ARE SYMPTOMS THAT SHOULD BE REPORTED IMMEDIATELY:  *FEVER GREATER THAN 100.5 F  *CHILLS WITH OR WITHOUT FEVER  NAUSEA AND VOMITING THAT IS NOT CONTROLLED WITH YOUR NAUSEA MEDICATION  *UNUSUAL SHORTNESS OF BREATH  *UNUSUAL BRUISING OR BLEEDING  TENDERNESS IN MOUTH AND THROAT WITH OR WITHOUT PRESENCE OF ULCERS  *URINARY PROBLEMS  *BOWEL PROBLEMS  UNUSUAL RASH Items with * indicate a potential emergency and should be followed up as soon as possible.  Feel free to call the clinic you have any questions or concerns. The clinic phone number is [phone].  Please show the CHEMO ALERT CARD at check-in to the Emergency Department and triage nurse.   

## 2017-01-20 ENCOUNTER — Other Ambulatory Visit: Payer: BLUE CROSS/BLUE SHIELD

## 2017-01-22 ENCOUNTER — Other Ambulatory Visit: Payer: Self-pay | Admitting: Medical Oncology

## 2017-01-22 DIAGNOSIS — C19 Malignant neoplasm of rectosigmoid junction: Secondary | ICD-10-CM

## 2017-01-22 MED ORDER — CAPECITABINE 500 MG PO TABS: ORAL_TABLET | ORAL | 1 refills | Status: DC

## 2017-02-08 ENCOUNTER — Other Ambulatory Visit: Payer: Self-pay | Admitting: Oncology

## 2017-02-09 ENCOUNTER — Other Ambulatory Visit (HOSPITAL_BASED_OUTPATIENT_CLINIC_OR_DEPARTMENT_OTHER): Payer: BLUE CROSS/BLUE SHIELD

## 2017-02-09 ENCOUNTER — Telehealth: Payer: Self-pay | Admitting: Oncology

## 2017-02-09 ENCOUNTER — Ambulatory Visit: Payer: BLUE CROSS/BLUE SHIELD

## 2017-02-09 ENCOUNTER — Ambulatory Visit (HOSPITAL_BASED_OUTPATIENT_CLINIC_OR_DEPARTMENT_OTHER): Payer: BLUE CROSS/BLUE SHIELD

## 2017-02-09 ENCOUNTER — Ambulatory Visit (HOSPITAL_BASED_OUTPATIENT_CLINIC_OR_DEPARTMENT_OTHER): Payer: BLUE CROSS/BLUE SHIELD | Admitting: Oncology

## 2017-02-09 ENCOUNTER — Other Ambulatory Visit: Payer: Self-pay | Admitting: *Deleted

## 2017-02-09 VITALS — BP 112/74 | HR 74 | Temp 98.7°F | Resp 18 | Ht 65.0 in | Wt 125.6 lb

## 2017-02-09 VITALS — BP 109/76 | HR 79 | Temp 98.6°F | Resp 20

## 2017-02-09 DIAGNOSIS — Z5112 Encounter for antineoplastic immunotherapy: Secondary | ICD-10-CM

## 2017-02-09 DIAGNOSIS — C787 Secondary malignant neoplasm of liver and intrahepatic bile duct: Secondary | ICD-10-CM

## 2017-02-09 DIAGNOSIS — M549 Dorsalgia, unspecified: Secondary | ICD-10-CM | POA: Diagnosis not present

## 2017-02-09 DIAGNOSIS — L03019 Cellulitis of unspecified finger: Secondary | ICD-10-CM | POA: Diagnosis not present

## 2017-02-09 DIAGNOSIS — R197 Diarrhea, unspecified: Secondary | ICD-10-CM

## 2017-02-09 DIAGNOSIS — C19 Malignant neoplasm of rectosigmoid junction: Secondary | ICD-10-CM

## 2017-02-09 DIAGNOSIS — D509 Iron deficiency anemia, unspecified: Secondary | ICD-10-CM | POA: Diagnosis not present

## 2017-02-09 DIAGNOSIS — R109 Unspecified abdominal pain: Secondary | ICD-10-CM | POA: Diagnosis not present

## 2017-02-09 DIAGNOSIS — C20 Malignant neoplasm of rectum: Secondary | ICD-10-CM | POA: Diagnosis not present

## 2017-02-09 DIAGNOSIS — D473 Essential (hemorrhagic) thrombocythemia: Secondary | ICD-10-CM | POA: Diagnosis not present

## 2017-02-09 DIAGNOSIS — R21 Rash and other nonspecific skin eruption: Secondary | ICD-10-CM

## 2017-02-09 DIAGNOSIS — C786 Secondary malignant neoplasm of retroperitoneum and peritoneum: Secondary | ICD-10-CM | POA: Diagnosis not present

## 2017-02-09 DIAGNOSIS — E876 Hypokalemia: Secondary | ICD-10-CM

## 2017-02-09 DIAGNOSIS — Z95828 Presence of other vascular implants and grafts: Secondary | ICD-10-CM

## 2017-02-09 LAB — CBC WITH DIFFERENTIAL/PLATELET
BASO%: 0.3 % (ref 0.0–2.0)
BASOS ABS: 0 10*3/uL (ref 0.0–0.1)
EOS ABS: 0.4 10*3/uL (ref 0.0–0.5)
EOS%: 5.8 % (ref 0.0–7.0)
HCT: 37.7 % (ref 34.8–46.6)
HGB: 11.6 g/dL (ref 11.6–15.9)
LYMPH%: 16.2 % (ref 14.0–49.7)
MCH: 25.7 pg (ref 25.1–34.0)
MCHC: 30.8 g/dL — AB (ref 31.5–36.0)
MCV: 83.4 fL (ref 79.5–101.0)
MONO#: 0.7 10*3/uL (ref 0.1–0.9)
MONO%: 10.6 % (ref 0.0–14.0)
NEUT#: 4.5 10*3/uL (ref 1.5–6.5)
NEUT%: 67.1 % (ref 38.4–76.8)
PLATELETS: 379 10*3/uL (ref 145–400)
RBC: 4.52 10*6/uL (ref 3.70–5.45)
RDW: 21.5 % — ABNORMAL HIGH (ref 11.2–14.5)
WBC: 6.7 10*3/uL (ref 3.9–10.3)
lymph#: 1.1 10*3/uL (ref 0.9–3.3)

## 2017-02-09 LAB — COMPREHENSIVE METABOLIC PANEL
ALT: 11 U/L (ref 0–55)
ANION GAP: 5 meq/L (ref 3–11)
AST: 15 U/L (ref 5–34)
Albumin: 2.9 g/dL — ABNORMAL LOW (ref 3.5–5.0)
Alkaline Phosphatase: 132 U/L (ref 40–150)
BILIRUBIN TOTAL: 0.47 mg/dL (ref 0.20–1.20)
BUN: 8.2 mg/dL (ref 7.0–26.0)
CO2: 28 meq/L (ref 22–29)
Calcium: 8.6 mg/dL (ref 8.4–10.4)
Chloride: 105 mEq/L (ref 98–109)
Creatinine: 0.5 mg/dL — ABNORMAL LOW (ref 0.6–1.1)
GLUCOSE: 80 mg/dL (ref 70–140)
POTASSIUM: 3.8 meq/L (ref 3.5–5.1)
SODIUM: 138 meq/L (ref 136–145)
TOTAL PROTEIN: 5.9 g/dL — AB (ref 6.4–8.3)

## 2017-02-09 LAB — MAGNESIUM: Magnesium: 1.9 mg/dl (ref 1.5–2.5)

## 2017-02-09 LAB — CEA (IN HOUSE-CHCC)

## 2017-02-09 MED ORDER — HEPARIN SOD (PORK) LOCK FLUSH 100 UNIT/ML IV SOLN
500.0000 [IU] | Freq: Once | INTRAVENOUS | Status: AC | PRN
  Administered 2017-02-09: 500 [IU]
  Filled 2017-02-09: qty 5

## 2017-02-09 MED ORDER — SODIUM CHLORIDE 0.9 % IV SOLN
Freq: Once | INTRAVENOUS | Status: AC
  Administered 2017-02-09: 11:00:00 via INTRAVENOUS

## 2017-02-09 MED ORDER — SODIUM CHLORIDE 0.9% FLUSH
10.0000 mL | INTRAVENOUS | Status: DC | PRN
  Administered 2017-02-09: 10 mL via INTRAVENOUS
  Filled 2017-02-09: qty 10

## 2017-02-09 MED ORDER — CAPECITABINE 500 MG PO TABS: ORAL_TABLET | ORAL | 1 refills | Status: DC

## 2017-02-09 MED ORDER — PANITUMUMAB CHEMO INJECTION 100 MG/5ML
5.7000 mg/kg | Freq: Once | INTRAVENOUS | Status: AC
  Administered 2017-02-09: 300 mg via INTRAVENOUS
  Filled 2017-02-09: qty 15

## 2017-02-09 MED ORDER — SODIUM CHLORIDE 0.9% FLUSH
10.0000 mL | INTRAVENOUS | Status: DC | PRN
  Administered 2017-02-09: 10 mL
  Filled 2017-02-09: qty 10

## 2017-02-09 NOTE — Telephone Encounter (Signed)
Gave patient AVS and calender per 5/14 los  

## 2017-02-09 NOTE — Progress Notes (Signed)
[facility] OFF[facility] PROGRESS NOTE   Diagnosis: Colon cancer  INTERVAL HISTORY:   Carolyn Barton completed another cycle of panitumumab on 01/19/2017. She continues Xeloda. She denies mouth sores and hand/foot pain. She has intermittent episodes of mid abdominal pain that improves after a diarrhea bowel movement. She has intermittent episodes of diarrhea, but does not have consistent diarrhea. No constipation. The cracking at the distal fingers has improved. The scalp is healing.  Objective:  Vital signs in last 24 hours:  Blood pressure 112/74, pulse 74, temperature 98.7 F (37.1 C), temperature source Oral, resp. rate 18, height '5\' 5"'$  (1.651 m), weight 125 lb 9.6 oz (57 kg), SpO2 100 %.    HEENT: No thrush or ulcers Resp: Lungs clear bilaterally Cardio: Regular rate and rhythm GI: No hepatosplenomegaly, left lower quadrant colostomy, mild tenderness interfere to the colostomy, no mass Vascular: No leg edema  Skin: Palms and soles without erythema or skin breakdown , scabbed lesion at the parietal scalp-improved  Portacath/PICC-without erythema  Lab Results:  Lab Results  Component Value Date   WBC 6.7 02/09/2017   HGB 11.6 02/09/2017   HCT 37.7 02/09/2017   MCV 83.4 02/09/2017   PLT 379 02/09/2017   NEUTROABS 4.5 02/09/2017    CMP     Component Value Date/Time   NA 138 02/09/2017 0924   K 3.8 02/09/2017 0924   CL 99 (L) 04/10/2016 1206   CL 101 12/01/2012 0924   CO2 28 02/09/2017 0924   GLUCOSE 80 02/09/2017 0924   GLUCOSE 94 12/01/2012 0924   BUN 8.2 02/09/2017 0924   CREATININE 0.5 (L) 02/09/2017 0924   CALCIUM 8.6 02/09/2017 0924   PROT 5.9 (L) 02/09/2017 0924   ALBUMIN 2.9 (L) 02/09/2017 0924   AST 15 02/09/2017 0924   ALT 11 02/09/2017 0924   ALKPHOS 132 02/09/2017 0924   BILITOT 0.47 02/09/2017 0924   GFRNONAA >60 04/10/2016 1206   GFRAA >60 04/10/2016 1206    Lab Results  Component Value Date   CEA1 <1.00 12/08/2016      Medications: I have reviewed the patient's current medications.  Assessment/Plan: 1. Metastatic colorectal cancer-status post an exploratory laparotomy 03/03/2016 revealing a proximal rectal mass, peritoneal carcinomatosis, and liver metastases  Biopsy of peritoneal nodules 03/03/2016 confirmed metastatic adenocarcinoma consistent with a colon primary  Foundation 1 testing-MSI-stable, tumor mutation burden-low, no BRAF NRAS or KRAS mutation  Cycle 1 FOLFIRI/PANITUMUMAB 04/14/2016  Cycle 2 FOLFIRI/panitumumab 04/28/2016  Cycle 3 FOLFIRI/panitumumab 05/12/2016  Cycle 4 FOLFIRI/panitumumab 05/27/2016  Cycle 5 FOLFIRI/panitumumab 06/09/2016  Restaging CT abdomen/pelvis 06/20/2016-no evidence of disease progression, decreased left hepatic lesion  Cycle 6 FOLFIRI/panitumumab 06/23/2016  Cycle 7 FOLFIRI/panitumumab 07/07/2016  Cycle 8 FOLFIRI/panitumumab 07/21/2016  Cycle 9 FOLFIRI/PANITUMUMAB 08/04/2016  Cycle 10 FOLFIRI/panitumumab 08/18/2016  Restaging CT 08/29/2016 with interval decrease in the size of the medial segment left liver lesion. Lesion identified previously in the rectosigmoid colon not evident on the current study.  5-FU/PANITUMUMAB 09/01/2016  Xeloda 7 days on/7 days off and PANITUMUMAB every 3 weeks 09/15/2016 (awaiting insurance approval for Xeloda 09/15/2016)  CT abdomen/pelvis 12/25/2016-unchanged 5 mm right hepatic lesion, resolution of medial segment left liver lesion, no new liver lesion. Residual soft tissue fullness in the sigmoid , persistent distal esophageal wall thickening  2.History of aBowel obstruction secondary to #1  3. Chronic back pain  4. Essential thrombocytosis  5. Early obstruction of the left ureter noted at the time of surgery 03/03/2016-Dr. Tannenbaum placed a left double-J stent 03/18/2016  6. Abdominal wall cellulitis 03/10/2016, blood cultures positive for coagulase negative staphylococcus-methicillin-resistant,  status post treatment with vancomycin and Bactrim  7. Tachycardia-persistent following discharge from the hospital, likely related to anemia and deconditioning  CT chest 04/04/2016-negative for pulmonary embolism.  8. Port-A-Cath placement 04/10/2016, interventional radiology  9. Iron deficiency anemia. Feraheme 11/05/2015 , 11/12/2015,and 04/14/2016-persistent anemia and red cell microcytosis, oral iron resumed 11/17/2016-stable  10. Pain/tenderness left posterior iliac region-resolved  11. Hypokalemia-started on potassium replacement 06/09/2016  12. Skin rashand paronychiasecondary to [facility], she continues minocycline, moisturizers, and fluticasone   Disposition:  She appears stable. The plan is to continue Xeloda/panitumumab. The etiology of the intermittent abdominal pain is unclear. He related to Xeloda enteritis, but she does not have consistent diarrhea. She does not appear obstructed. She is scheduled to see Dr. Brantley Stage next week to consider resection of the primary tumor and colostomy reversal.  Carolyn Barton will return for an office visit and chemotherapy in 3 weeks. She will contact us in the interim for increased pain.  [name] Coder, MD  02/09/2017  11:12 AM

## 2017-02-09 NOTE — Patient Instructions (Signed)
[facility] Discharge Instructions for Patients Receiving Chemotherapy  Today you received the following chemotherapy agents Vectibix  To help prevent nausea and vomiting after your treatment, we encourage you to take your nausea medication as directed.   If you develop nausea and vomiting that is not controlled by your nausea medication, call the clinic.   BELOW ARE SYMPTOMS THAT SHOULD BE REPORTED IMMEDIATELY:  *FEVER GREATER THAN 100.5 F  *CHILLS WITH OR WITHOUT FEVER  NAUSEA AND VOMITING THAT IS NOT CONTROLLED WITH YOUR NAUSEA MEDICATION  *UNUSUAL SHORTNESS OF BREATH  *UNUSUAL BRUISING OR BLEEDING  TENDERNESS IN MOUTH AND THROAT WITH OR WITHOUT PRESENCE OF ULCERS  *URINARY PROBLEMS  *BOWEL PROBLEMS  UNUSUAL RASH Items with * indicate a potential emergency and should be followed up as soon as possible.  Feel free to call the clinic you have any questions or concerns. The clinic phone number is [phone].  Please show the CHEMO ALERT CARD at check-in to the Emergency Department and triage nurse.   

## 2017-02-10 ENCOUNTER — Telehealth: Payer: Self-pay | Admitting: *Deleted

## 2017-02-10 NOTE — Telephone Encounter (Signed)
-----   Message from Ladell Pier, MD sent at 02/09/2017  5:42 PM EDT ----- Please call patient, cea is normal

## 2017-02-10 NOTE — Telephone Encounter (Signed)
Per Dr. Sherrill, patient notified that cea is normal.  Patient appreciative of call and has no questions at this time. 

## 2017-02-23 ENCOUNTER — Other Ambulatory Visit: Payer: Self-pay | Admitting: Oncology

## 2017-03-02 ENCOUNTER — Ambulatory Visit (HOSPITAL_BASED_OUTPATIENT_CLINIC_OR_DEPARTMENT_OTHER): Payer: BLUE CROSS/BLUE SHIELD

## 2017-03-02 ENCOUNTER — Telehealth: Payer: Self-pay | Admitting: Oncology

## 2017-03-02 ENCOUNTER — Ambulatory Visit (HOSPITAL_BASED_OUTPATIENT_CLINIC_OR_DEPARTMENT_OTHER): Payer: BLUE CROSS/BLUE SHIELD | Admitting: Nurse Practitioner

## 2017-03-02 ENCOUNTER — Ambulatory Visit: Payer: BLUE CROSS/BLUE SHIELD

## 2017-03-02 ENCOUNTER — Other Ambulatory Visit (HOSPITAL_BASED_OUTPATIENT_CLINIC_OR_DEPARTMENT_OTHER): Payer: BLUE CROSS/BLUE SHIELD

## 2017-03-02 VITALS — BP 127/84 | HR 73 | Temp 97.7°F | Resp 18 | Ht 65.0 in | Wt 125.6 lb

## 2017-03-02 DIAGNOSIS — C787 Secondary malignant neoplasm of liver and intrahepatic bile duct: Secondary | ICD-10-CM

## 2017-03-02 DIAGNOSIS — Z5112 Encounter for antineoplastic immunotherapy: Secondary | ICD-10-CM | POA: Diagnosis not present

## 2017-03-02 DIAGNOSIS — C20 Malignant neoplasm of rectum: Secondary | ICD-10-CM

## 2017-03-02 DIAGNOSIS — D473 Essential (hemorrhagic) thrombocythemia: Secondary | ICD-10-CM | POA: Diagnosis not present

## 2017-03-02 DIAGNOSIS — R197 Diarrhea, unspecified: Secondary | ICD-10-CM

## 2017-03-02 DIAGNOSIS — C19 Malignant neoplasm of rectosigmoid junction: Secondary | ICD-10-CM

## 2017-03-02 DIAGNOSIS — L03019 Cellulitis of unspecified finger: Secondary | ICD-10-CM | POA: Diagnosis not present

## 2017-03-02 DIAGNOSIS — R21 Rash and other nonspecific skin eruption: Secondary | ICD-10-CM | POA: Diagnosis not present

## 2017-03-02 DIAGNOSIS — R109 Unspecified abdominal pain: Secondary | ICD-10-CM

## 2017-03-02 DIAGNOSIS — M549 Dorsalgia, unspecified: Secondary | ICD-10-CM

## 2017-03-02 DIAGNOSIS — D509 Iron deficiency anemia, unspecified: Secondary | ICD-10-CM

## 2017-03-02 DIAGNOSIS — E876 Hypokalemia: Secondary | ICD-10-CM

## 2017-03-02 DIAGNOSIS — C786 Secondary malignant neoplasm of retroperitoneum and peritoneum: Secondary | ICD-10-CM

## 2017-03-02 LAB — COMPREHENSIVE METABOLIC PANEL
ALT: 13 U/L (ref 0–55)
ANION GAP: 7 meq/L (ref 3–11)
AST: 19 U/L (ref 5–34)
Albumin: 3.2 g/dL — ABNORMAL LOW (ref 3.5–5.0)
Alkaline Phosphatase: 135 U/L (ref 40–150)
BILIRUBIN TOTAL: 0.44 mg/dL (ref 0.20–1.20)
BUN: 8.1 mg/dL (ref 7.0–26.0)
CO2: 27 meq/L (ref 22–29)
CREATININE: 0.5 mg/dL — AB (ref 0.6–1.1)
Calcium: 8.8 mg/dL (ref 8.4–10.4)
Chloride: 105 mEq/L (ref 98–109)
EGFR: >90 mL/min/{1.73_m2} (ref >90)
Glucose: 72 mg/dl (ref 70–140)
Potassium: 4 mEq/L (ref 3.5–5.1)
Sodium: 140 mEq/L (ref 136–145)
TOTAL PROTEIN: 6.2 g/dL — AB (ref 6.4–8.3)

## 2017-03-02 LAB — CBC WITH DIFFERENTIAL/PLATELET
BASO%: 0.8 % (ref 0.0–2.0)
BASOS ABS: 0 10*3/uL (ref 0.0–0.1)
EOS%: 6.1 % (ref 0.0–7.0)
Eosinophils Absolute: 0.4 10*3/uL (ref 0.0–0.5)
HEMATOCRIT: 40.3 % (ref 34.8–46.6)
HEMOGLOBIN: 13 g/dL (ref 11.6–15.9)
LYMPH#: 1 10*3/uL (ref 0.9–3.3)
LYMPH%: 16.4 % (ref 14.0–49.7)
MCH: 26.8 pg (ref 25.1–34.0)
MCHC: 32.3 g/dL (ref 31.5–36.0)
MCV: 82.9 fL (ref 79.5–101.0)
MONO#: 0.6 10*3/uL (ref 0.1–0.9)
MONO%: 9.2 % (ref 0.0–14.0)
NEUT#: 4.3 10*3/uL (ref 1.5–6.5)
NEUT%: 67.5 % (ref 38.4–76.8)
Platelets: 383 10*3/uL (ref 145–400)
RBC: 4.86 10*6/uL (ref 3.70–5.45)
RDW: 22.6 % — AB (ref 11.2–14.5)
WBC: 6.3 10*3/uL (ref 3.9–10.3)

## 2017-03-02 LAB — MAGNESIUM: Magnesium: 2 mg/dl (ref 1.5–2.5)

## 2017-03-02 MED ORDER — LIDOCAINE-PRILOCAINE 2.5-2.5 % EX CREA: TOPICAL_CREAM | CUTANEOUS | 2 refills | Status: AC

## 2017-03-02 MED ORDER — SODIUM CHLORIDE 0.9 % IV SOLN
5.8000 mg/kg | Freq: Once | INTRAVENOUS | Status: AC
  Administered 2017-03-02: 300 mg via INTRAVENOUS
  Filled 2017-03-02: qty 15

## 2017-03-02 MED ORDER — SODIUM CHLORIDE 0.9% FLUSH
10.0000 mL | INTRAVENOUS | Status: DC | PRN
  Administered 2017-03-02: 10 mL
  Filled 2017-03-02: qty 10

## 2017-03-02 MED ORDER — HEPARIN SOD (PORK) LOCK FLUSH 100 UNIT/ML IV SOLN
500.0000 [IU] | Freq: Once | INTRAVENOUS | Status: AC | PRN
  Administered 2017-03-02: 500 [IU]
  Filled 2017-03-02: qty 5

## 2017-03-02 MED ORDER — SODIUM CHLORIDE 0.9 % IV SOLN
Freq: Once | INTRAVENOUS | Status: AC
  Administered 2017-03-02: 11:00:00 via INTRAVENOUS

## 2017-03-02 MED ORDER — SODIUM CHLORIDE 0.9 % IJ SOLN
10.0000 mL | INTRAMUSCULAR | Status: DC | PRN
  Administered 2017-03-02: 10 mL via INTRAVENOUS
  Filled 2017-03-02: qty 10

## 2017-03-02 NOTE — Patient Instructions (Signed)
[facility] Discharge Instructions for Patients Receiving Chemotherapy  Today you received the following chemotherapy agents Vectibix  To help prevent nausea and vomiting after your treatment, we encourage you to take your nausea medication as directed.   If you develop nausea and vomiting that is not controlled by your nausea medication, call the clinic.   BELOW ARE SYMPTOMS THAT SHOULD BE REPORTED IMMEDIATELY:  *FEVER GREATER THAN 100.5 F  *CHILLS WITH OR WITHOUT FEVER  NAUSEA AND VOMITING THAT IS NOT CONTROLLED WITH YOUR NAUSEA MEDICATION  *UNUSUAL SHORTNESS OF BREATH  *UNUSUAL BRUISING OR BLEEDING  TENDERNESS IN MOUTH AND THROAT WITH OR WITHOUT PRESENCE OF ULCERS  *URINARY PROBLEMS  *BOWEL PROBLEMS  UNUSUAL RASH Items with * indicate a potential emergency and should be followed up as soon as possible.  Feel free to call the clinic you have any questions or concerns. The clinic phone number is [phone].  Please show the CHEMO ALERT CARD at check-in to the Emergency Department and triage nurse.   

## 2017-03-02 NOTE — Telephone Encounter (Signed)
Pt appt. With Dr. Crisoforo Oxford  Is 03/09/17 @1 :15. Called pt left vm in ref to appt.

## 2017-03-02 NOTE — Patient Instructions (Signed)
Implanted Port Home Guide An implanted port is a type of central line that is placed under the skin. Central lines are used to provide IV access when treatment or nutrition needs to be given through a person's veins. Implanted ports are used for long-term IV access. An implanted port may be placed because:  You need IV medicine that would be irritating to the small veins in your hands or arms.  You need long-term IV medicines, such as antibiotics.  You need IV nutrition for a long period.  You need frequent blood draws for lab tests.  You need dialysis.  Implanted ports are usually placed in the chest area, but they can also be placed in the upper arm, the abdomen, or the leg. An implanted port has two main parts:  Reservoir. The reservoir is round and will appear as a small, raised area under your skin. The reservoir is the part where a needle is inserted to give medicines or draw blood.  Catheter. The catheter is a thin, flexible tube that extends from the reservoir. The catheter is placed into a large vein. Medicine that is inserted into the reservoir goes into the catheter and then into the vein.  How will I care for my incision site? Do not get the incision site wet. Bathe or shower as directed by your health care provider. How is my port accessed? Special steps must be taken to access the port:  Before the port is accessed, a numbing cream can be placed on the skin. This helps numb the skin over the port site.  Your health care provider uses a sterile technique to access the port. ? Your health care provider must put on a mask and sterile gloves. ? The skin over your port is cleaned carefully with an antiseptic and allowed to dry. ? The port is gently pinched between sterile gloves, and a needle is inserted into the port.  Only "non-coring" port needles should be used to access the port. Once the port is accessed, a blood return should be checked. This helps ensure that the port  is in the vein and is not clogged.  If your port needs to remain accessed for a constant infusion, a clear (transparent) bandage will be placed over the needle site. The bandage and needle will need to be changed every week, or as directed by your health care provider.  Keep the bandage covering the needle clean and dry. Do not get it wet. Follow your health care provider's instructions on how to take a shower or bath while the port is accessed.  If your port does not need to stay accessed, no bandage is needed over the port.  What is flushing? Flushing helps keep the port from getting clogged. Follow your health care provider's instructions on how and when to flush the port. Ports are usually flushed with saline solution or a medicine called heparin. The need for flushing will depend on how the port is used.  If the port is used for intermittent medicines or blood draws, the port will need to be flushed: ? After medicines have been given. ? After blood has been drawn. ? As part of routine maintenance.  If a constant infusion is running, the port may not need to be flushed.  How long will my port stay implanted? The port can stay in for as long as your health care provider thinks it is needed. When it is time for the port to come out, surgery will be   done to remove it. The procedure is similar to the one performed when the port was put in. When should I seek immediate medical care? When you have an implanted port, you should seek immediate medical care if:  You notice a bad smell coming from the incision site.  You have swelling, redness, or drainage at the incision site.  You have more swelling or pain at the port site or the surrounding area.  You have a fever that is not controlled with medicine.  This information is not intended to replace advice given to you by your health care provider. Make sure you discuss any questions you have with your health care provider. Document  Released: 09/15/2005 Document Revised: 02/21/2016 Document Reviewed: 05/23/2013 Elsevier Interactive Patient Education  2017 [employer]  

## 2017-03-02 NOTE — Telephone Encounter (Signed)
Gave patient AVS and calender per 6/4 LOS - referral sent to [facility] in [place] .

## 2017-03-02 NOTE — Progress Notes (Addendum)
[place] OFFICE PROGRESS NOTE   Diagnosis:  Colon cancer  INTERVAL HISTORY:   Carolyn Barton returns as scheduled. She completed another cycle of PANITUMUMAB 02/09/2017. She continues Xeloda 7 days on/7 days off. She denies nausea/vomiting. No mouth sores. She continues to have intermittent abdominal pain followed by diarrhea. She notes improvement in the skin especially involving the fingers. Scalp continues to heal.  Objective:  Vital signs in last 24 hours:  Blood pressure 127/84, pulse 73, temperature 97.7 F (36.5 C), temperature source Oral, resp. rate 18, height 5' 5" (1.651 m), weight 125 lb 9.6 oz (57 kg), SpO2 100 %.    HEENT: No thrush or ulcers. Resp: Lungs clear bilaterally. Cardio: Regular rate and rhythm. GI: Abdomen soft and nontender. No organomegaly. Left lower quadrant colostomy. Vascular: No leg edema. Skin: Palms without erythema. No skin breakdown. Scabbed lesion at the parietal scalp. Port-A-Cath without erythema.    Lab Results:  Lab Results  Component Value Date   WBC 6.3 03/02/2017   HGB 13.0 03/02/2017   HCT 40.3 03/02/2017   MCV 82.9 03/02/2017   PLT 383 03/02/2017   NEUTROABS 4.3 03/02/2017    Imaging:  No results found.  Medications: I have reviewed the patient's current medications.  Assessment/Plan: 1. Metastatic colorectal cancer-status post an exploratory laparotomy 03/03/2016 revealing a proximal rectal mass, peritoneal carcinomatosis, and liver metastases  Biopsy of peritoneal nodules 03/03/2016 confirmed metastatic adenocarcinoma consistent with a colon primary  Foundation 1 testing-MSI-stable, tumor mutation burden-low, no BRAF NRAS or KRAS mutation  Cycle 1 FOLFIRI/PANITUMUMAB 04/14/2016  Cycle 2 FOLFIRI/panitumumab 04/28/2016  Cycle 3 FOLFIRI/panitumumab 05/12/2016  Cycle 4 FOLFIRI/panitumumab 05/27/2016  Cycle 5 FOLFIRI/panitumumab 06/09/2016  Restaging CT abdomen/pelvis 06/20/2016-no evidence of  disease progression, decreased left hepatic lesion  Cycle 6 FOLFIRI/panitumumab 06/23/2016  Cycle 7 FOLFIRI/panitumumab 07/07/2016  Cycle 8 FOLFIRI/panitumumab 07/21/2016  Cycle 9 FOLFIRI/PANITUMUMAB 08/04/2016  Cycle 10 FOLFIRI/panitumumab 08/18/2016  Restaging CT 08/29/2016 with interval decrease in the size of the medial segment left liver lesion. Lesion identified previously in the rectosigmoid colon not evident on the current study.  5-FU/PANITUMUMAB 09/01/2016  Xeloda 7 days on/7 days off and PANITUMUMAB every 3 weeks 09/15/2016 (awaiting insurance approval for Xeloda 09/15/2016)  CT abdomen/pelvis 12/25/2016-unchanged 5 mm right hepatic lesion, resolution of medial segment left liver lesion, no new liver lesion. Residual soft tissue fullness in the sigmoid , persistent distal esophageal wall thickening  Attenuation of PANITUMUMAB every 3 weeks and Xeloda 7 days on/7 days off  2.History of aBowel obstruction secondary to #1  3. Chronic back pain  4. Essential thrombocytosis  5. Early obstruction of the left ureter noted at the time of surgery 03/03/2016-Dr. Tannenbaum placed a left double-J stent 03/18/2016  6. Abdominal wall cellulitis 03/10/2016, blood cultures positive for coagulase negative staphylococcus-methicillin-resistant, status post treatment with vancomycin and Bactrim  7. Tachycardia-persistent following discharge from the hospital, likely related to anemia and deconditioning  CT chest 04/04/2016-negative for pulmonary embolism.  8. Port-A-Cath placement 04/10/2016, interventional radiology  9. Iron deficiency anemia. Feraheme 11/05/2015 , 11/12/2015,and 04/14/2016-persistent anemia and red cell microcytosis, oral iron resumed 11/17/2016-stable  10. Pain/tenderness left posterior iliac region-resolved  11. Hypokalemia-started on potassium replacement 06/09/2016  12. Skin rashand paronychiasecondary to [facility], she  continues minocycline, moisturizers, and fluticasone    [name] appears stable. There is no clinical evidence of disease progression. Plan to continue Xeloda/PANITUMUMAB as she is currently taking.  She reports seeing Dr. Brantley Stage 2 weeks ago. There is no plan for  surgery. She is interested in a second surgical opinion. We made a referral to Dr. Morton Stall.  She will return for a follow-up visit and PANITUMUMAB in 3 weeks. She will contact the office in the interim with any problems.  Patient seen with Dr. Benay Spice.  Carolyn Barton   03/02/2017  10:06 AM  This was a shared visit with Carolyn Barton. The intermittent abdominal pain may be related to adhesions, less likely chemotherapy or progressive carcinomatosis. The plan is to continue Xeloda/panitumumab.  Carolyn Barton, M.D.

## 2017-03-04 ENCOUNTER — Other Ambulatory Visit: Payer: Self-pay | Admitting: Urology

## 2017-03-04 ENCOUNTER — Telehealth: Payer: Self-pay

## 2017-03-04 NOTE — Telephone Encounter (Signed)
Catsue at [facility] called requesting resending foundation 1 report. The pt has an upcoming appt. She tried [employer] 2 days ago and did not receive report. Report printed and faxed.

## 2017-03-06 ENCOUNTER — Encounter: Payer: Self-pay | Admitting: *Deleted

## 2017-03-06 NOTE — Progress Notes (Signed)
Message received from Triage stating that [facility] is requesting an additional copy of Foundation One report d/t first copy was not clear.  Second copy faxed to [phone].

## 2017-03-09 ENCOUNTER — Encounter (HOSPITAL_BASED_OUTPATIENT_CLINIC_OR_DEPARTMENT_OTHER): Payer: Self-pay | Admitting: *Deleted

## 2017-03-10 ENCOUNTER — Encounter (HOSPITAL_BASED_OUTPATIENT_CLINIC_OR_DEPARTMENT_OTHER): Payer: Self-pay | Admitting: *Deleted

## 2017-03-10 NOTE — Progress Notes (Signed)
NPO AFTER MN.  ARRIVE AT 0900.  CURRENT LAB RESULTS IN CHART AND EPIC.  WILL TAKE METHADONE AM DOS W/ SIPS OF WATER.

## 2017-03-13 NOTE — H&P (Signed)
Office Visit Report     03/04/2017   --------------------------------------------------------------------------------   Carolyn Barton  MRN: [id number]  PRIMARY CARE:  Patrina Levering, MD  DOB: [birth year], 61 year old Female  REFERRING:    SSN:   PROVIDER:  Carolan Clines, M.D.    LOCATION:  [facility] [phone]   --------------------------------------------------------------------------------   CC: I have a ureteral obstruction.  HPI: Carolyn Barton is a 61 year-old female established patient who is here for a ureteral obstruction.  The problem is on the left side. Her problem was diagnosed 03/18/2016. She had the following x-rays done: CT Scan. She does have a stent in place. Her stent was placed 10/09/2016 .   The ureteral obstruction is due to mass. The ureteral obstruction has been treated with chronic stent.   She is not having flank pain. She does not have a history of urinary infections. Her renal function is normal. She did not see blood in her urine.   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 Has not smoked since 03/29/1976.  Has not drank since 03/30/1991.  Does  not drink caffeine.    REVIEW OF SYSTEMS:    GU Review Female:   Patient denies frequent urination, hard to postpone urination, burning /pain with urination, get up at night to urinate, leakage of urine, stream starts and stops, trouble starting your stream, have to strain to urinate, and being pregnant.  Gastrointestinal (Upper):   Patient denies nausea, vomiting, and indigestion/ heartburn.  Gastrointestinal (Lower):   Patient denies diarrhea and constipation.  Constitutional:   Patient denies fever, night sweats, weight loss, and fatigue.  Skin:   Patient denies skin rash/ lesion and itching.  Eyes:   Patient denies blurred vision and double vision.  Ears/ Nose/ Throat:   Patient denies sore throat and sinus problems.  Hematologic/Lymphatic:   Patient denies swollen glands and easy bruising.  Cardiovascular:   Patient denies leg swelling and chest pains.  Respiratory:   Patient denies cough and shortness of breath.  Endocrine:   Patient denies excessive thirst.  Musculoskeletal:   Patient denies back pain and joint pain.  Neurological:   Patient denies headaches and dizziness.  Psychologic:   Patient denies depression and anxiety.   VITAL SIGNS:      03/04/2017 10:43 AM  BP 115/82 mmHg  Pulse 98 /min  Temperature 98.3 F / 37 C   MULTI-SYSTEM PHYSICAL EXAMINATION:    Constitutional: Thin. No physical deformities. Normally developed. Good grooming.   Neck: Neck symmetrical, not swollen. Normal tracheal position.  Respiratory: No labored breathing, no use of accessory muscles.   Cardiovascular: Normal temperature, normal extremity  pulses, no swelling, no varicosities.  Lymphatic: No enlargement of neck, axillae, groin.  Skin: No paleness, no jaundice, no cyanosis. No lesion, no ulcer, no rash.  Neurologic / Psychiatric: Oriented to time, oriented to place, oriented to person. No depression, no anxiety, no agitation.  Gastrointestinal: No mass, no tenderness, no rigidity, non obese  abdomen.  Eyes: Normal conjunctivae. Normal eyelids.  Ears, Nose, Mouth, and Throat: Left ear no scars, no lesions, no masses. Right ear no scars, no lesions, no masses. Nose no scars, no lesions, no masses. Normal hearing. Normal lips.  Musculoskeletal: Normal gait and station of head and neck.     PAST DATA REVIEWED:  Source Of History:  Patient  Records Review:   Previous Patient Records   03/04/17  Urinalysis  Urine Appearance Clear   Urine Color Yellow   Urine Glucose Neg   Urine Bilirubin Neg   Urine Ketones Neg   Urine Specific Gravity 1.015   Urine Blood 2+   Urine pH 7.5   Urine Protein 1+   Urine Urobilinogen 0.2   Urine Nitrites Neg   Urine Leukocyte Esterase Trace   Urine WBC/hpf 0 - 5/hpf   Urine RBC/hpf 10 - 20/hpf   Urine Epithelial Cells 0 - 5/hpf   Urine Bacteria Rare (0-9/hpf)   Urine Mucous Not Present   Urine Yeast NS (Not Seen)    PROCEDURES:          Urinalysis w/Scope Dipstick Dipstick Cont'd Micro  Color: Yellow Bilirubin: Neg WBC/hpf: 0 - 5/hpf  Appearance: Clear Ketones: Neg RBC/hpf: 10 - 20/hpf  Specific Gravity: 1.015 Blood: 2+ Bacteria: Rare (0-9/hpf)  pH: 7.5 Protein: 1+ Cystals: NS (Not Seen)  Glucose: Neg Urobilinogen: 0.2 Casts: NS (Not Seen)    Nitrites: Neg Trichomonas: Not Present    Leukocyte Esterase: Trace Mucous: Not Present      Epithelial Cells: 0 - 5/hpf      Yeast: NS (Not Seen)      Sperm: Not Present    ASSESSMENT:      ICD-10 Details  1 GU:   Ureteral stricture - N13.5   2   Hydronephrosis Unspec - N13.30   3 NON-GU:   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 on 03/04/17 at 5:54 PM (EDT)     The information contained in this medical record document is considered private and confidential  patient information. This information can only be used for the medical diagnosis and/or medical services that are being provided by the patient's selected caregivers. This information can only be distributed outside of the patient's care if the patient agrees and signs waivers of authorization for this information to be sent to an outside source or route.

## 2017-03-16 ENCOUNTER — Encounter (HOSPITAL_BASED_OUTPATIENT_CLINIC_OR_DEPARTMENT_OTHER): Payer: Self-pay | Admitting: *Deleted

## 2017-03-16 ENCOUNTER — Ambulatory Visit (HOSPITAL_BASED_OUTPATIENT_CLINIC_OR_DEPARTMENT_OTHER): Payer: BLUE CROSS/BLUE SHIELD | Admitting: Anesthesiology

## 2017-03-16 ENCOUNTER — Encounter (HOSPITAL_BASED_OUTPATIENT_CLINIC_OR_DEPARTMENT_OTHER)
Admission: RE | Disposition: A | Discharge status: Home / Self Care | Payer: Self-pay | Source: Ambulatory Visit | Attending: Urology

## 2017-03-16 ENCOUNTER — Ambulatory Visit (HOSPITAL_BASED_OUTPATIENT_CLINIC_OR_DEPARTMENT_OTHER)
Admission: RE | Admit: 2017-03-16 | Discharge: 2017-03-16 | Disposition: A | Discharge status: Home / Self Care | Payer: BLUE CROSS/BLUE SHIELD | Source: Ambulatory Visit | Attending: Urology | Admitting: Urology

## 2017-03-16 DIAGNOSIS — N131 Hydronephrosis with ureteral stricture, not elsewhere classified: Secondary | ICD-10-CM | POA: Insufficient documentation

## 2017-03-16 DIAGNOSIS — D649 Anemia, unspecified: Secondary | ICD-10-CM | POA: Diagnosis not present

## 2017-03-16 DIAGNOSIS — Z87891 Personal history of nicotine dependence: Secondary | ICD-10-CM | POA: Insufficient documentation

## 2017-03-16 DIAGNOSIS — Z79899 Other long term (current) drug therapy: Secondary | ICD-10-CM | POA: Insufficient documentation

## 2017-03-16 DIAGNOSIS — C19 Malignant neoplasm of rectosigmoid junction: Secondary | ICD-10-CM | POA: Diagnosis not present

## 2017-03-16 DIAGNOSIS — Z79891 Long term (current) use of opiate analgesic: Secondary | ICD-10-CM | POA: Insufficient documentation

## 2017-03-16 DIAGNOSIS — Z8249 Family history of ischemic heart disease and other diseases of the circulatory system: Secondary | ICD-10-CM | POA: Diagnosis not present

## 2017-03-16 DIAGNOSIS — Z808 Family history of malignant neoplasm of other organs or systems: Secondary | ICD-10-CM | POA: Insufficient documentation

## 2017-03-16 DIAGNOSIS — R000 Tachycardia, unspecified: Secondary | ICD-10-CM | POA: Diagnosis not present

## 2017-03-16 DIAGNOSIS — Z9889 Other specified postprocedural states: Secondary | ICD-10-CM | POA: Diagnosis not present

## 2017-03-16 HISTORY — DX: Secondary malignant neoplasm of liver and intrahepatic bile duct: C78.7

## 2017-03-16 HISTORY — PX: CYSTOSCOPY W/ URETERAL STENT PLACEMENT: SHX1429

## 2017-03-16 SURGERY — CYSTOSCOPY, FLEXIBLE, WITH STENT REPLACEMENT
Anesthesia: General | Site: Renal | Laterality: Left

## 2017-03-16 MED ORDER — PROPOFOL 10 MG/ML IV BOLUS
INTRAVENOUS | Status: DC | PRN
  Administered 2017-03-16: 130 mg via INTRAVENOUS

## 2017-03-16 MED ORDER — FENTANYL CITRATE (PF) 100 MCG/2ML IJ SOLN
INTRAMUSCULAR | Status: DC | PRN
  Administered 2017-03-16: 50 ug via INTRAVENOUS

## 2017-03-16 MED ORDER — OXYCODONE HCL 5 MG/5ML PO SOLN
5.0000 mg | Freq: Once | ORAL | Status: DC | PRN
  Filled 2017-03-16: qty 5

## 2017-03-16 MED ORDER — CEFAZOLIN SODIUM-DEXTROSE 2-4 GM/100ML-% IV SOLN
2.0000 g | INTRAVENOUS | Status: AC
  Administered 2017-03-16: 2 g via INTRAVENOUS
  Filled 2017-03-16: qty 100

## 2017-03-16 MED ORDER — BELLADONNA ALKALOIDS-OPIUM 16.2-60 MG RE SUPP
RECTAL | Status: AC
  Filled 2017-03-16: qty 1

## 2017-03-16 MED ORDER — KETOROLAC TROMETHAMINE 30 MG/ML IJ SOLN
INTRAMUSCULAR | Status: DC | PRN
  Administered 2017-03-16: 30 mg via INTRAVENOUS

## 2017-03-16 MED ORDER — DEXAMETHASONE SODIUM PHOSPHATE 4 MG/ML IJ SOLN
INTRAMUSCULAR | Status: DC | PRN
  Administered 2017-03-16: 10 mg via INTRAVENOUS

## 2017-03-16 MED ORDER — STERILE WATER FOR INJECTION IJ SOLN
INTRAMUSCULAR | Status: DC | PRN
  Administered 2017-03-16: 3000 mL via INTRAVESICAL

## 2017-03-16 MED ORDER — MIDAZOLAM HCL 2 MG/2ML IJ SOLN
INTRAMUSCULAR | Status: AC
  Filled 2017-03-16: qty 2

## 2017-03-16 MED ORDER — IOHEXOL 300 MG/ML  SOLN
INTRAMUSCULAR | Status: DC | PRN
Start: 2017-03-16 — End: 2017-03-16
  Administered 2017-03-16: 4 mL

## 2017-03-16 MED ORDER — LACTATED RINGERS IV SOLN
INTRAVENOUS | Status: DC
  Administered 2017-03-16: 10:00:00 via INTRAVENOUS
  Filled 2017-03-16: qty 1000

## 2017-03-16 MED ORDER — OXYCODONE HCL 5 MG PO TABS
5.0000 mg | ORAL_TABLET | Freq: Once | ORAL | Status: DC | PRN
  Filled 2017-03-16: qty 1

## 2017-03-16 MED ORDER — PROPOFOL 10 MG/ML IV BOLUS
INTRAVENOUS | Status: AC
  Filled 2017-03-16: qty 40

## 2017-03-16 MED ORDER — LIDOCAINE 2% (20 MG/ML) 5 ML SYRINGE
INTRAMUSCULAR | Status: DC | PRN
  Administered 2017-03-16: 60 mg via INTRAVENOUS

## 2017-03-16 MED ORDER — ONDANSETRON HCL 4 MG/2ML IJ SOLN
INTRAMUSCULAR | Status: DC | PRN
  Administered 2017-03-16: 4 mg via INTRAVENOUS

## 2017-03-16 MED ORDER — LIDOCAINE HCL 2 % EX GEL
CUTANEOUS | Status: AC
  Filled 2017-03-16: qty 5

## 2017-03-16 MED ORDER — MIDAZOLAM HCL 5 MG/5ML IJ SOLN
INTRAMUSCULAR | Status: DC | PRN
  Administered 2017-03-16: 1 mg via INTRAVENOUS

## 2017-03-16 MED ORDER — FENTANYL CITRATE (PF) 100 MCG/2ML IJ SOLN
25.0000 ug | INTRAMUSCULAR | Status: DC | PRN
  Filled 2017-03-16: qty 1

## 2017-03-16 MED ORDER — URIBEL 118 MG PO CAPS: 1.0000 | ORAL_CAPSULE | Freq: Two times a day (BID) | ORAL | 3 refills | Status: DC | PRN

## 2017-03-16 MED ORDER — FENTANYL CITRATE (PF) 100 MCG/2ML IJ SOLN
INTRAMUSCULAR | Status: AC
  Filled 2017-03-16: qty 2

## 2017-03-16 MED ORDER — CEFAZOLIN SODIUM-DEXTROSE 2-4 GM/100ML-% IV SOLN
INTRAVENOUS | Status: AC
  Filled 2017-03-16: qty 100

## 2017-03-16 MED ORDER — KETOROLAC TROMETHAMINE 30 MG/ML IJ SOLN
INTRAMUSCULAR | Status: AC
Start: 2017-03-16 — End: 2017-03-16
  Filled 2017-03-16: qty 1

## 2017-03-16 SURGICAL SUPPLY — 16 items
BAG DRAIN URO-CYSTO SKYTR STRL (DRAIN) ×3 IMPLANT
BAG DRN UROCATH (DRAIN) ×1
BOOTIES KNEE HIGH SLOAN (MISCELLANEOUS) ×3 IMPLANT
CATH INTERMIT  6FR 70CM (CATHETERS) ×2 IMPLANT
CLOTH BEACON ORANGE TIMEOUT ST (SAFETY) ×3 IMPLANT
GLOVE BIO SURGEON STRL SZ7.5 (GLOVE) ×3 IMPLANT
GOWN STRL REUS W/ TWL XL LVL3 (GOWN DISPOSABLE) ×1 IMPLANT
GOWN STRL REUS W/TWL XL LVL3 (GOWN DISPOSABLE) ×3
GUIDEWIRE STR DUAL SENSOR (WIRE) ×2 IMPLANT
KIT RM TURNOVER CYSTO AR (KITS) ×3 IMPLANT
MANIFOLD NEPTUNE II (INSTRUMENTS) ×2 IMPLANT
PACK CYSTO (CUSTOM PROCEDURE TRAY) ×3 IMPLANT
STENT POLARIS LOOP 8FR X 26 CM (STENTS) ×2 IMPLANT
TUBE CONNECTING 12'X1/4 (SUCTIONS) ×1
TUBE CONNECTING 12X1/4 (SUCTIONS) ×1 IMPLANT
WATER STERILE IRR 3000ML UROMA (IV SOLUTION) ×2 IMPLANT

## 2017-03-16 NOTE — Op Note (Signed)
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   Surgeon:  Chauncey Cruel. Gaynelle Arabian, MD  First assistant:  None  Anesthesia: General LMA  Preparation:  After appropriate preanesthesia, the patient was brought to the operative room, placed on the operating table in the dorsal supine position where general LMA anesthesia was introduced. The patient was replaced in the dorsal lithotomy position with the pubis was prepped with Betadine solution and draped in usual fashion. The history was reviewed. The armband was double checked.  Review history:  Carolyn Barton is a 61 year-old female established patient who has Left external  ureteral obstruction.  Her problem was diagnosed 03/18/2016. She had the following x-rays done: CT Scan. She does have a stent in place. Her stent was placed 10/09/2016 .   The ureteral obstruction is due to mass. The ureteral obstruction has been treated with chronic stent.   She is not having flank pain. She does not have a history of urinary infections. Her renal function is normal. She did not see blood in her urine.   Her original dx was metastatic colon cancer.      ALLERGIES: None    Statement of  Likelihood of Success: Excellent. TIME-OUT observed.:  Procedure:  Cystourethroscopy was accomplished. The previously placed left double-J stent was identified within the left ureteral orifice. The left orifice was noted to be gaping, and I was able to insert the outer forceps within the orifice, and attach the double-J stent, and remove it. Left retrograde polyp was then performed, by inserting a 6 [place] open-ended catheter in the left ureter, with instillation of Renografin. The ureter showed no  intrinsic obstruction. There was no evidence of extrinsic obstruction on this retrograde. There was mild dilation of the renal pelvis.  A 0.038 guidewire was passed through the open-ended catheter, and coiled in the renal pelvis. The open-ended catheter was then removed, and over the guidewire, and 8 [place], by 26 cm Polaris stent was passed, and coiled in the left renal pelvis, exiting through the left ureteral orifice. The guidewire was removed. Documentation of the double-J stent was accomplished under fluoroscopic control. The bladder was drained of fluid. The patient was awakened, and taken to recovery room in good condition. Total anesthesia time was less than 15 minutes. There was no bleeding noted.

## 2017-03-16 NOTE — Anesthesia Preprocedure Evaluation (Signed)
Anesthesia Evaluation  Patient identified by MRN, date of birth, ID band Patient awake    Reviewed: Allergy & Precautions, H&P , Patient's Chart, lab work & pertinent test results, reviewed documented beta blocker date and time   History of Anesthesia Complications (+) PONV and history of anesthetic complications  Airway Mallampati: II  TM Distance: >3 FB Neck ROM: full    Dental  (+) Teeth Intact   Pulmonary former smoker,    Pulmonary exam normal breath sounds clear to auscultation       Cardiovascular Exercise Tolerance: Good  Rhythm:regular Rate:Normal     Neuro/Psych    GI/Hepatic hiatal hernia,   Endo/Other    Renal/GU      Musculoskeletal   Abdominal   Peds  Hematology  (+) anemia ,   Anesthesia Other Findings   Reproductive/Obstetrics                             Anesthesia Physical Anesthesia Plan  ASA: II  Anesthesia Plan: General   Post-op Pain Management:    Induction: Intravenous  PONV Risk Score and Plan: 3 and Ondansetron, Dexamethasone, Propofol, Midazolam and Treatment may vary due to age or medical condition  Airway Management Planned: Oral ETT and LMA  Additional Equipment: None  Intra-op Plan:   Post-operative Plan: Extubation in OR  Informed Consent: I have reviewed the patients History and Physical, chart, labs and discussed the procedure including the risks, benefits and alternatives for the proposed anesthesia with the patient or authorized representative who has indicated his/her understanding and acceptance.   Dental advisory given  Plan Discussed with: CRNA and Surgeon  Anesthesia Plan Comments:         Anesthesia Quick Evaluation

## 2017-03-16 NOTE — Transfer of Care (Signed)
  Last Vitals:  Vitals:   03/16/17 0858  BP: 106/68  Pulse: 83  Resp: 18  Temp: 36.9 C    Last Pain:  Vitals:   03/16/17 0858  TempSrc: Oral      Patients Stated Pain Goal: 6 (03/16/17 4627)  Immediate Anesthesia Transfer of Care Note  Patient: Carolyn Barton  Procedure(s) Performed: Procedure(s) (LRB): CYSTOSCOPY WITH RETROGRADE PYELOGRAM WITH LEFT  STENT REMOVAL AND REPLACEMENT (Left)  Patient Location: PACU  Anesthesia Type: General  Level of Consciousness: awake, alert  and oriented  Airway & Oxygen Therapy: Patient Spontanous Breathing and Patient connected to nasal cannula oxygen  Post-op Assessment: Report given to PACU RN and Post -op Vital signs reviewed and stable  Post vital signs: Reviewed and stable  Complications: No apparent anesthesia complications

## 2017-03-16 NOTE — Interval H&P Note (Signed)
History and Physical Interval Note:  03/16/2017 9:54 AM  Carolyn Barton  has presented today for surgery, with the diagnosis of LEFT URETERAL OBSTRUCTION  The various methods of treatment have been discussed with the patient and family. After consideration of risks, benefits and other options for treatment, the patient has consented to  Procedure(s): CYSTOSCOPY WITH LEFT  STENT REMOVAL AND REPLACEMENT (Left) as a surgical intervention .  The patient's history has been reviewed, patient examined, no change in status, stable for surgery.  I have reviewed the patient's chart and labs.  Questions were answered to the patient's satisfaction.     Shellye Zandi I Tsuruko Murtha

## 2017-03-16 NOTE — Discharge Instructions (Addendum)
CYSTOSCOPY HOME CARE INSTRUCTIONS  Activity: Rest for the remainder of the day. Do not drive or operate equipment today. You may resume normal activities in one to two days as instructed by your physician.   Meals: Drink plenty of liquids and eat light foods such as gelatin or soup this evening. Post Anesthesia Home Care Instructions  Activity: Get plenty of rest for the remainder of the day. A responsible individual must stay with you for 24 hours following the procedure.  For the next 24 hours, DO NOT: -Drive a car -Paediatric nurse -Drink alcoholic beverages -Take any medication unless instructed by your physician -Make any legal decisions or sign important papers.  Meals: Start with liquid foods such as gelatin or soup. Progress to regular foods as tolerated. Avoid greasy, spicy, heavy foods. If nausea and/or vomiting occur, drink only clear liquids until the nausea and/or vomiting subsides. Call your physician if vomiting continues.  Special Instructions/Symptoms: Your throat may feel dry or sore from the anesthesia or the breathing tube placed in your throat during surgery. If this causes discomfort, gargle with warm salt water. The discomfort should disappear within 24 hours.  If you had a scopolamine patch placed behind your ear for the management of post- operative nausea and/or vomiting:  1. The medication in the patch is effective for 72 hours, after which it should be removed.  Wrap patch in a tissue and discard in the trash. Wash hands thoroughly with soap and water. 2. You may remove the patch earlier than 72 hours if you experience unpleasant side effects which may include dry mouth, dizziness or visual disturbances. 3. Avoid touching the patch. Wash your hands with soap and water after contact with the patch.    You may return to a normal meal plan tomorrow.  Return to Work: You may return to work in one to two days or as instructed by your physician.  Special  Instructions / Symptoms: Call your physician if any of these symptoms occur:   -persistent or heavy bleeding  -bleeding which continues after first few urination  -large blood clots that are difficult to pass  -urine stream diminishes or stops completely  -fever equal to or higher than 101 degrees Farenheit.  -cloudy urine with a strong, foul odor  -severe pain  Females should always wipe from front to back after elimination.  You may feel some burning pain when you urinate.  This should disappear with time.  Applying moist heat to the lower abdomen or a hot tub bath may help relieve the pain. \  Follow-Up / Date of Return Visit to Your Physician:   Call for an appointment to arrange follow-up. Will need stent exchange in 6 months, with Dr. Junious Silk.   Patient Signature:  ________________________________________________________  Nurse's Signature:  ________________________________________________________

## 2017-03-16 NOTE — Anesthesia Procedure Notes (Signed)
Procedure Name: LMA Insertion Date/Time: 03/16/2017 11:22 AM Performed by: Oleta Mouse Pre-anesthesia Checklist: Patient identified, Emergency Drugs available, Suction available and Patient being monitored Patient Re-evaluated:Patient Re-evaluated prior to inductionOxygen Delivery Method: Circle system utilized Preoxygenation: Pre-oxygenation with 100% oxygen Intubation Type: IV induction Ventilation: Mask ventilation without difficulty LMA: LMA inserted LMA Size: 3.0 Number of attempts: 1 Airway Equipment and Method: Bite block Placement Confirmation: positive ETCO2 Tube secured with: Tape Dental Injury: Teeth and Oropharynx as per pre-operative assessment

## 2017-03-17 ENCOUNTER — Encounter (HOSPITAL_BASED_OUTPATIENT_CLINIC_OR_DEPARTMENT_OTHER): Payer: Self-pay | Admitting: Urology

## 2017-03-18 NOTE — Anesthesia Postprocedure Evaluation (Signed)
Anesthesia Post Note  Patient: Carolyn Barton  Procedure(s) Performed: Procedure(s) (LRB): CYSTOSCOPY WITH RETROGRADE PYELOGRAM WITH LEFT  STENT REMOVAL AND REPLACEMENT (Left)     Patient location during evaluation: PACU Anesthesia Type: General Level of consciousness: awake and alert Pain management: pain level controlled Vital Signs Assessment: post-procedure vital signs reviewed and stable Respiratory status: spontaneous breathing, nonlabored ventilation, respiratory function stable and patient connected to nasal cannula oxygen Cardiovascular status: blood pressure returned to baseline and stable Postop Assessment: no signs of nausea or vomiting Anesthetic complications: no    Last Vitals:  Vitals:   03/16/17 1245 03/16/17 1315  BP: 115/75 112/68  Pulse: (!) 58 78  Resp: 12 12  Temp:  36.6 C    Last Pain:  Vitals:   03/16/17 0858  TempSrc: Oral                 [employer]

## 2017-03-20 ENCOUNTER — Other Ambulatory Visit: Payer: Self-pay | Admitting: *Deleted

## 2017-03-20 DIAGNOSIS — C19 Malignant neoplasm of rectosigmoid junction: Secondary | ICD-10-CM

## 2017-03-20 DIAGNOSIS — C20 Malignant neoplasm of rectum: Secondary | ICD-10-CM

## 2017-03-22 ENCOUNTER — Other Ambulatory Visit: Payer: Self-pay | Admitting: Oncology

## 2017-03-23 ENCOUNTER — Ambulatory Visit (HOSPITAL_BASED_OUTPATIENT_CLINIC_OR_DEPARTMENT_OTHER): Payer: BLUE CROSS/BLUE SHIELD | Admitting: Oncology

## 2017-03-23 ENCOUNTER — Other Ambulatory Visit (HOSPITAL_BASED_OUTPATIENT_CLINIC_OR_DEPARTMENT_OTHER): Payer: BLUE CROSS/BLUE SHIELD

## 2017-03-23 ENCOUNTER — Telehealth: Payer: Self-pay | Admitting: Oncology

## 2017-03-23 ENCOUNTER — Ambulatory Visit: Payer: BLUE CROSS/BLUE SHIELD

## 2017-03-23 ENCOUNTER — Ambulatory Visit (HOSPITAL_BASED_OUTPATIENT_CLINIC_OR_DEPARTMENT_OTHER): Payer: BLUE CROSS/BLUE SHIELD

## 2017-03-23 VITALS — BP 119/79 | HR 77 | Temp 98.1°F | Resp 20 | Ht 65.5 in | Wt 126.4 lb

## 2017-03-23 DIAGNOSIS — E876 Hypokalemia: Secondary | ICD-10-CM | POA: Diagnosis not present

## 2017-03-23 DIAGNOSIS — Z5111 Encounter for antineoplastic chemotherapy: Secondary | ICD-10-CM

## 2017-03-23 DIAGNOSIS — C19 Malignant neoplasm of rectosigmoid junction: Secondary | ICD-10-CM

## 2017-03-23 DIAGNOSIS — C20 Malignant neoplasm of rectum: Secondary | ICD-10-CM

## 2017-03-23 DIAGNOSIS — D509 Iron deficiency anemia, unspecified: Secondary | ICD-10-CM | POA: Diagnosis not present

## 2017-03-23 DIAGNOSIS — C786 Secondary malignant neoplasm of retroperitoneum and peritoneum: Secondary | ICD-10-CM

## 2017-03-23 DIAGNOSIS — C787 Secondary malignant neoplasm of liver and intrahepatic bile duct: Secondary | ICD-10-CM | POA: Diagnosis not present

## 2017-03-23 LAB — COMPREHENSIVE METABOLIC PANEL WITH GFR
ALT: 12 U/L (ref 0–55)
AST: 16 U/L (ref 5–34)
Albumin: 3.1 g/dL — ABNORMAL LOW (ref 3.5–5.0)
Alkaline Phosphatase: 131 U/L (ref 40–150)
Anion Gap: 7 meq/L (ref 3–11)
BUN: 12.8 mg/dL (ref 7.0–26.0)
CO2: 27 meq/L (ref 22–29)
Calcium: 8.9 mg/dL (ref 8.4–10.4)
Chloride: 106 meq/L (ref 98–109)
Creatinine: 0.6 mg/dL (ref 0.6–1.1)
EGFR: >90 ml/min/1.73 m2
Glucose: 76 mg/dL (ref 70–140)
Potassium: 4.1 meq/L (ref 3.5–5.1)
Sodium: 140 meq/L (ref 136–145)
Total Bilirubin: 0.32 mg/dL (ref 0.20–1.20)
Total Protein: 6.1 g/dL — ABNORMAL LOW (ref 6.4–8.3)

## 2017-03-23 LAB — CBC WITH DIFFERENTIAL/PLATELET
BASO%: 0.8 % (ref 0.0–2.0)
Basophils Absolute: 0 10e3/uL (ref 0.0–0.1)
EOS%: 7.1 % — ABNORMAL HIGH (ref 0.0–7.0)
Eosinophils Absolute: 0.4 10e3/uL (ref 0.0–0.5)
HCT: 40.4 % (ref 34.8–46.6)
HGB: 13 g/dL (ref 11.6–15.9)
LYMPH%: 17.6 % (ref 14.0–49.7)
MCH: 27.2 pg (ref 25.1–34.0)
MCHC: 32.2 g/dL (ref 31.5–36.0)
MCV: 84.4 fL (ref 79.5–101.0)
MONO#: 0.6 10e3/uL (ref 0.1–0.9)
MONO%: 10.2 % (ref 0.0–14.0)
NEUT#: 3.7 10e3/uL (ref 1.5–6.5)
NEUT%: 64.3 % (ref 38.4–76.8)
Platelets: 393 10e3/uL (ref 145–400)
RBC: 4.79 10e6/uL (ref 3.70–5.45)
RDW: 22.2 % — ABNORMAL HIGH (ref 11.2–14.5)
WBC: 5.7 10e3/uL (ref 3.9–10.3)
lymph#: 1 10e3/uL (ref 0.9–3.3)

## 2017-03-23 LAB — CEA (IN HOUSE-CHCC): CEA (CHCC-In House): 1.02 ng/mL (ref 0.00–5.00)

## 2017-03-23 MED ORDER — SODIUM CHLORIDE 0.9 % IJ SOLN
10.0000 mL | Freq: Once | INTRAMUSCULAR | Status: AC
  Administered 2017-03-23: 10 mL
  Filled 2017-03-23: qty 10

## 2017-03-23 MED ORDER — SODIUM CHLORIDE 0.9% FLUSH
10.0000 mL | INTRAVENOUS | Status: DC | PRN
  Administered 2017-03-23: 10 mL
  Filled 2017-03-23: qty 10

## 2017-03-23 MED ORDER — SODIUM CHLORIDE 0.9 % IV SOLN
Freq: Once | INTRAVENOUS | Status: AC
  Administered 2017-03-23: 11:00:00 via INTRAVENOUS

## 2017-03-23 MED ORDER — SODIUM CHLORIDE 0.9 % IV SOLN
300.0000 mg | Freq: Once | INTRAVENOUS | Status: AC
  Administered 2017-03-23: 300 mg via INTRAVENOUS
  Filled 2017-03-23: qty 15

## 2017-03-23 MED ORDER — HEPARIN SOD (PORK) LOCK FLUSH 100 UNIT/ML IV SOLN
500.0000 [IU] | Freq: Once | INTRAVENOUS | Status: AC | PRN
  Administered 2017-03-23: 500 [IU]
  Filled 2017-03-23: qty 5

## 2017-03-23 NOTE — Telephone Encounter (Signed)
Scheduled appt per 6/25 los- gave patient AVS and calender per los 

## 2017-03-23 NOTE — Patient Instructions (Signed)
[facility] Discharge Instructions for Patients Receiving Chemotherapy  Today you received the following chemotherapy agents Vectibix  To help prevent nausea and vomiting after your treatment, we encourage you to take your nausea medication as directed.   If you develop nausea and vomiting that is not controlled by your nausea medication, call the clinic.   BELOW ARE SYMPTOMS THAT SHOULD BE REPORTED IMMEDIATELY:  *FEVER GREATER THAN 100.5 F  *CHILLS WITH OR WITHOUT FEVER  NAUSEA AND VOMITING THAT IS NOT CONTROLLED WITH YOUR NAUSEA MEDICATION  *UNUSUAL SHORTNESS OF BREATH  *UNUSUAL BRUISING OR BLEEDING  TENDERNESS IN MOUTH AND THROAT WITH OR WITHOUT PRESENCE OF ULCERS  *URINARY PROBLEMS  *BOWEL PROBLEMS  UNUSUAL RASH Items with * indicate a potential emergency and should be followed up as soon as possible.  Feel free to call the clinic you have any questions or concerns. The clinic phone number is [phone].  Please show the CHEMO ALERT CARD at check-in to the Emergency Department and triage nurse.   

## 2017-03-23 NOTE — Progress Notes (Signed)
[facility] OFFICE PROGRESS NOTE   Diagnosis: Colon cancer  INTERVAL HISTORY:   Ms. Butch returns as scheduled. She continues Xeloda/panitumumab. No diarrhea, mouth sores, or hand/foot pain. Areas of paronychia have resolved. The scalp eschar is healing. She feels well. She is scheduled to see Dr. Flonnie Hailstone on 04/14/2017.  Objective:  Vital signs in last 24 hours:  Blood pressure 119/79, pulse 77, temperature 98.1 F (36.7 C), temperature source Oral, resp. rate 20, height 5' 5.5" (1.664 m), weight 126 lb 6.4 oz (57.3 kg), SpO2 100 %.    HEENT: No thrush or ulcers Resp: Lungs clear bilaterally Cardio: Regular rate and rhythm GI: No hepatosplenomegaly, no mass, nontender, left lower quadrant colostomy Vascular: No leg edema  Skin: Eschar the parietal scalp, no erythema or skin breakdown at the hands, mild acne type rash over the face   Portacath/PICC-without erythema  Lab Results:  Lab Results  Component Value Date   WBC 5.7 03/23/2017   HGB 13.0 03/23/2017   HCT 40.4 03/23/2017   MCV 84.4 03/23/2017   PLT 393 03/23/2017   NEUTROABS 3.7 03/23/2017    CMP     Component Value Date/Time   NA 140 03/23/2017 0853   K 4.1 03/23/2017 0853   CL 99 (L) 04/10/2016 1206   CL 101 12/01/2012 0924   CO2 27 03/23/2017 0853   GLUCOSE 76 03/23/2017 0853   GLUCOSE 94 12/01/2012 0924   BUN 12.8 03/23/2017 0853   CREATININE 0.6 03/23/2017 0853   CALCIUM 8.9 03/23/2017 0853   PROT 6.1 (L) 03/23/2017 0853   ALBUMIN 3.1 (L) 03/23/2017 0853   AST 16 03/23/2017 0853   ALT 12 03/23/2017 0853   ALKPHOS 131 03/23/2017 0853   BILITOT 0.32 03/23/2017 0853   GFRNONAA >60 04/10/2016 1206   GFRAA >60 04/10/2016 1206    Lab Results  Component Value Date   CEA1 <1.00 02/09/2017    Medications: I have reviewed the patient's current medications.  Assessment/Plan: 1. Metastatic colorectal cancer-status post an exploratory laparotomy 03/03/2016 revealing a proximal rectal  mass, peritoneal carcinomatosis, and liver metastases  Biopsy of peritoneal nodules 03/03/2016 confirmed metastatic adenocarcinoma consistent with a colon primary  Foundation 1 testing-MSI-stable, tumor mutation burden-low, no BRAF NRAS or KRAS mutation  Cycle 1 FOLFIRI/PANITUMUMAB 04/14/2016  Cycle 2 FOLFIRI/panitumumab 04/28/2016  Cycle 3 FOLFIRI/panitumumab 05/12/2016  Cycle 4 FOLFIRI/panitumumab 05/27/2016  Cycle 5 FOLFIRI/panitumumab 06/09/2016  Restaging CT abdomen/pelvis 06/20/2016-no evidence of disease progression, decreased left hepatic lesion  Cycle 6 FOLFIRI/panitumumab 06/23/2016  Cycle 7 FOLFIRI/panitumumab 07/07/2016  Cycle 8 FOLFIRI/panitumumab 07/21/2016  Cycle 9 FOLFIRI/PANITUMUMAB 08/04/2016  Cycle 10 FOLFIRI/panitumumab 08/18/2016  Restaging CT 08/29/2016 with interval decrease in the size of the medial segment left liver lesion. Lesion identified previously in the rectosigmoid colon not evident on the current study.  5-FU/PANITUMUMAB 09/01/2016  Xeloda 7 days on/7 days off and PANITUMUMAB every 3 weeks 09/15/2016 (awaiting insurance approval for Xeloda 09/15/2016)  CT abdomen/pelvis 12/25/2016-unchanged 5 mm right hepatic lesion, resolution of medial segment left liver lesion, no new liver lesion. Residual soft tissue fullness in the sigmoid , persistent distal esophageal wall thickening  Continuation of PANITUMUMAB every 3 weeks and Xeloda 7 days on/7 days off  2.History of aBowel obstruction secondary to #1  3. Chronic back pain  4. Essential thrombocytosis  5. Early obstruction of the left ureter noted at the time of surgery 03/03/2016-Dr. Tannenbaum placed a left double-J stent 03/18/2016  6. Abdominal wall cellulitis 03/10/2016, blood cultures positive for coagulase negative  staphylococcus-methicillin-resistant, status post treatment with vancomycin and Bactrim  7. Tachycardia-persistent following discharge from the hospital,  likely related to anemia and deconditioning  CT chest 04/04/2016-negative for pulmonary embolism.  8. Port-A-Cath placement 04/10/2016, interventional radiology  9. Iron deficiency anemia. Feraheme 11/05/2015 , 11/12/2015,and 04/14/2016-persistent anemia and red cell microcytosis, oral iron resumed 11/17/2016-stable  10. Pain/tenderness left posterior iliac region-resolved  11. Hypokalemia-started on potassium replacement 06/09/2016  12. Skin rashand paronychiasecondary to [facility], she continues minocycline, moisturizers, and fluticasone     Disposition:  She appears well. The plan is to continue Xeloda/panitumumab. Ms. Fenderson is scheduled to see Dr. Crisoforo Oxford on 04/14/2017 to discuss the indication for resection of the primary colon tumor and reversal of the colostomy.  She will return for an office visit and panitumumab in 3 weeks. 15 minutes were spent with the patient today. The majority of the time was used for counseling and coordination of care.  Donneta Romberg, MD  03/23/2017  10:44 AM

## 2017-04-10 ENCOUNTER — Other Ambulatory Visit: Payer: Self-pay | Admitting: Oncology

## 2017-04-13 ENCOUNTER — Ambulatory Visit (HOSPITAL_BASED_OUTPATIENT_CLINIC_OR_DEPARTMENT_OTHER): Payer: BLUE CROSS/BLUE SHIELD

## 2017-04-13 ENCOUNTER — Other Ambulatory Visit (HOSPITAL_BASED_OUTPATIENT_CLINIC_OR_DEPARTMENT_OTHER): Payer: BLUE CROSS/BLUE SHIELD

## 2017-04-13 ENCOUNTER — Ambulatory Visit: Payer: BLUE CROSS/BLUE SHIELD

## 2017-04-13 ENCOUNTER — Telehealth: Payer: Self-pay | Admitting: Nurse Practitioner

## 2017-04-13 ENCOUNTER — Ambulatory Visit (HOSPITAL_BASED_OUTPATIENT_CLINIC_OR_DEPARTMENT_OTHER): Payer: BLUE CROSS/BLUE SHIELD | Admitting: Nurse Practitioner

## 2017-04-13 VITALS — BP 117/68 | HR 69 | Temp 98.6°F | Resp 18 | Ht 65.0 in | Wt 127.1 lb

## 2017-04-13 DIAGNOSIS — C786 Secondary malignant neoplasm of retroperitoneum and peritoneum: Secondary | ICD-10-CM

## 2017-04-13 DIAGNOSIS — Z5112 Encounter for antineoplastic immunotherapy: Secondary | ICD-10-CM | POA: Diagnosis not present

## 2017-04-13 DIAGNOSIS — C787 Secondary malignant neoplasm of liver and intrahepatic bile duct: Secondary | ICD-10-CM

## 2017-04-13 DIAGNOSIS — D509 Iron deficiency anemia, unspecified: Secondary | ICD-10-CM | POA: Diagnosis not present

## 2017-04-13 DIAGNOSIS — C19 Malignant neoplasm of rectosigmoid junction: Secondary | ICD-10-CM

## 2017-04-13 DIAGNOSIS — C20 Malignant neoplasm of rectum: Secondary | ICD-10-CM

## 2017-04-13 DIAGNOSIS — L03032 Cellulitis of left toe: Secondary | ICD-10-CM | POA: Diagnosis not present

## 2017-04-13 DIAGNOSIS — E876 Hypokalemia: Secondary | ICD-10-CM | POA: Diagnosis not present

## 2017-04-13 DIAGNOSIS — R21 Rash and other nonspecific skin eruption: Secondary | ICD-10-CM | POA: Diagnosis not present

## 2017-04-13 DIAGNOSIS — D473 Essential (hemorrhagic) thrombocythemia: Secondary | ICD-10-CM | POA: Diagnosis not present

## 2017-04-13 LAB — CBC WITH DIFFERENTIAL/PLATELET
BASO%: 0.5 % (ref 0.0–2.0)
Basophils Absolute: 0 10*3/uL (ref 0.0–0.1)
EOS%: 6.2 % (ref 0.0–7.0)
Eosinophils Absolute: 0.4 10*3/uL (ref 0.0–0.5)
HEMATOCRIT: 41.7 % (ref 34.8–46.6)
HEMOGLOBIN: 13.1 g/dL (ref 11.6–15.9)
LYMPH#: 1 10*3/uL (ref 0.9–3.3)
LYMPH%: 15.7 % (ref 14.0–49.7)
MCH: 27.4 pg (ref 25.1–34.0)
MCHC: 31.4 g/dL — ABNORMAL LOW (ref 31.5–36.0)
MCV: 87.2 fL (ref 79.5–101.0)
MONO#: 0.6 10*3/uL (ref 0.1–0.9)
MONO%: 10 % (ref 0.0–14.0)
NEUT%: 67.6 % (ref 38.4–76.8)
NEUTROS ABS: 4.3 10*3/uL (ref 1.5–6.5)
Platelets: 362 10*3/uL (ref 145–400)
RBC: 4.78 10*6/uL (ref 3.70–5.45)
RDW: 19.9 % — AB (ref 11.2–14.5)
WBC: 6.3 10*3/uL (ref 3.9–10.3)

## 2017-04-13 LAB — MAGNESIUM: Magnesium: 2.2 mg/dl (ref 1.5–2.5)

## 2017-04-13 LAB — COMPREHENSIVE METABOLIC PANEL
ALBUMIN: 3.2 g/dL — AB (ref 3.5–5.0)
ALK PHOS: 141 U/L (ref 40–150)
ALT: 11 U/L (ref 0–55)
AST: 16 U/L (ref 5–34)
Anion Gap: 8 mEq/L (ref 3–11)
BUN: 9.6 mg/dL (ref 7.0–26.0)
CALCIUM: 9 mg/dL (ref 8.4–10.4)
CO2: 27 mEq/L (ref 22–29)
CREATININE: 0.6 mg/dL (ref 0.6–1.1)
Chloride: 105 mEq/L (ref 98–109)
EGFR: >90 mL/min/{1.73_m2} (ref >90)
GLUCOSE: 78 mg/dL (ref 70–140)
POTASSIUM: 4.1 meq/L (ref 3.5–5.1)
SODIUM: 140 meq/L (ref 136–145)
TOTAL PROTEIN: 6.2 g/dL — AB (ref 6.4–8.3)
Total Bilirubin: 0.39 mg/dL (ref 0.20–1.20)

## 2017-04-13 LAB — CEA (IN HOUSE-CHCC): CEA (CHCC-In House): <1 ng/mL (ref 0.00–5.00)

## 2017-04-13 MED ORDER — SODIUM CHLORIDE 0.9% FLUSH
10.0000 mL | INTRAVENOUS | Status: DC | PRN
  Administered 2017-04-13: 10 mL
  Filled 2017-04-13: qty 10

## 2017-04-13 MED ORDER — SODIUM CHLORIDE 0.9 % IV SOLN
5.8000 mg/kg | Freq: Once | INTRAVENOUS | Status: AC
  Administered 2017-04-13: 300 mg via INTRAVENOUS
  Filled 2017-04-13: qty 15

## 2017-04-13 MED ORDER — SODIUM CHLORIDE 0.9 % IV SOLN
Freq: Once | INTRAVENOUS | Status: AC
  Administered 2017-04-13: 10:00:00 via INTRAVENOUS

## 2017-04-13 MED ORDER — HEPARIN SOD (PORK) LOCK FLUSH 100 UNIT/ML IV SOLN
500.0000 [IU] | Freq: Once | INTRAVENOUS | Status: AC | PRN
  Administered 2017-04-13: 500 [IU]
  Filled 2017-04-13: qty 5

## 2017-04-13 MED ORDER — SODIUM CHLORIDE 0.9 % IJ SOLN
10.0000 mL | INTRAMUSCULAR | Status: DC | PRN
  Administered 2017-04-13: 10 mL
  Filled 2017-04-13: qty 10

## 2017-04-13 NOTE — Telephone Encounter (Signed)
Scheduled appt per 7/16 los - Gave patient AVS and calender per los.  

## 2017-04-13 NOTE — Progress Notes (Signed)
[place] OFFICE PROGRESS NOTE   Diagnosis:  Colon cancer  INTERVAL HISTORY:   Carolyn Barton returns as scheduled. She continues Xeloda/PANITUMUMAB. She denies nausea/vomiting. No mouth sores. No diarrhea. Rashes "much better". About 2 weeks ago she developed a "crack" on the heel of the right foot. This is improving. She notes fatigue and decreased "stamina".  Objective:  Vital signs in last 24 hours:  Blood pressure 117/68, pulse 69, temperature 98.6 F (37 C), temperature source Oral, resp. rate 18, height '5\' 5"'$  (1.651 m), weight 127 lb 1.6 oz (57.7 kg), SpO2 100 %.    HEENT: No thrush or ulcers. Resp: Lungs clear bilaterally. Cardio: Regular rate and rhythm. GI: No hepatomegaly. Left lower quadrant colostomy. Vascular: No leg edema. Skin: Palms without erythema. Soles are dry appearing. Linear superficial ulcer at the right heel, appears to be healing. Port-A-Cath without erythema.    Lab Results:  Lab Results  Component Value Date   WBC 6.3 04/13/2017   HGB 13.1 04/13/2017   HCT 41.7 04/13/2017   MCV 87.2 04/13/2017   PLT 362 04/13/2017   NEUTROABS 4.3 04/13/2017    Imaging:  No results found.  Medications: I have reviewed the patient's current medications.  Assessment/Plan: 1. Metastatic colorectal cancer-status post an exploratory laparotomy 03/03/2016 revealing a proximal rectal mass, peritoneal carcinomatosis, and liver metastases  Biopsy of peritoneal nodules 03/03/2016 confirmed metastatic adenocarcinoma consistent with a colon primary  Foundation 1 testing-MSI-stable, tumor mutation burden-low, no BRAF NRAS or KRAS mutation  Cycle 1 FOLFIRI/PANITUMUMAB 04/14/2016  Cycle 2 FOLFIRI/panitumumab 04/28/2016  Cycle 3 FOLFIRI/panitumumab 05/12/2016  Cycle 4 FOLFIRI/panitumumab 05/27/2016  Cycle 5 FOLFIRI/panitumumab 06/09/2016  Restaging CT abdomen/pelvis 06/20/2016-no evidence of disease progression, decreased left hepatic  lesion  Cycle 6 FOLFIRI/panitumumab 06/23/2016  Cycle 7 FOLFIRI/panitumumab 07/07/2016  Cycle 8 FOLFIRI/panitumumab 07/21/2016  Cycle 9 FOLFIRI/PANITUMUMAB 08/04/2016  Cycle 10 FOLFIRI/panitumumab 08/18/2016  Restaging CT 08/29/2016 with interval decrease in the size of the medial segment left liver lesion. Lesion identified previously in the rectosigmoid colon not evident on the current study.  5-FU/PANITUMUMAB 09/01/2016  Xeloda 7 days on/7 days off and PANITUMUMAB every 3 weeks 09/15/2016 (awaiting insurance approval for Xeloda 09/15/2016)  CT abdomen/pelvis 12/25/2016-unchanged 5 mm right hepatic lesion, resolution of medial segment left liver lesion, no new liver lesion. Residual soft tissue fullness in the sigmoid , persistent distal esophageal wall thickening  Continuation of PANITUMUMAB every 3 weeks and Xeloda 7 days on/7 days off  2.History of aBowel obstruction secondary to #1  3. Chronic back pain  4. Essential thrombocytosis  5. Early obstruction of the left ureter noted at the time of surgery 03/03/2016-Dr. Tannenbaum placed a left double-J stent 03/18/2016  6. Abdominal wall cellulitis 03/10/2016, blood cultures positive for coagulase negative staphylococcus-methicillin-resistant, status post treatment with vancomycin and Bactrim  7. Tachycardia-persistent following discharge from the hospital, likely related to anemia and deconditioning  CT chest 04/04/2016-negative for pulmonary embolism.  8. Port-A-Cath placement 04/10/2016, interventional radiology  9. Iron deficiency anemia. Feraheme 11/05/2015 , 11/12/2015,and 04/14/2016-persistent anemia and red cell microcytosis, oral iron resumed 11/17/2016-stable  10. Pain/tenderness left posterior iliac region-resolved  11. Hypokalemia-started on potassium replacement 06/09/2016  12. Skin rashand paronychiasecondary to [facility], she continues minocycline, moisturizers, and  fluticasone   [name] appears stable. There is no clinical evidence of disease progression. The plan is to continue Xeloda/PANITUMUMAB.  She is scheduled to see Dr. Crisoforo Oxford next week to discuss the indication for resection of the primary colon tumor and reversal  of the colostomy.  She will return for a follow-up visit and PANITUMUMAB in 3 weeks. She will contact the office in the interim with any problems.    Ned Card ANP/GNP-BC   04/13/2017  9:17 AM

## 2017-04-13 NOTE — Patient Instructions (Signed)
[facility] Discharge Instructions for Patients Receiving Chemotherapy  Today you received the following chemotherapy agents Vectibix  To help prevent nausea and vomiting after your treatment, we encourage you to take your nausea medication as directed.   If you develop nausea and vomiting that is not controlled by your nausea medication, call the clinic.   BELOW ARE SYMPTOMS THAT SHOULD BE REPORTED IMMEDIATELY:  *FEVER GREATER THAN 100.5 F  *CHILLS WITH OR WITHOUT FEVER  NAUSEA AND VOMITING THAT IS NOT CONTROLLED WITH YOUR NAUSEA MEDICATION  *UNUSUAL SHORTNESS OF BREATH  *UNUSUAL BRUISING OR BLEEDING  TENDERNESS IN MOUTH AND THROAT WITH OR WITHOUT PRESENCE OF ULCERS  *URINARY PROBLEMS  *BOWEL PROBLEMS  UNUSUAL RASH Items with * indicate a potential emergency and should be followed up as soon as possible.  Feel free to call the clinic you have any questions or concerns. The clinic phone number is [phone].  Please show the CHEMO ALERT CARD at check-in to the Emergency Department and triage nurse.   

## 2017-04-22 ENCOUNTER — Telehealth: Payer: Self-pay

## 2017-04-22 ENCOUNTER — Other Ambulatory Visit: Payer: Self-pay | Admitting: *Deleted

## 2017-04-22 MED ORDER — DULOXETINE HCL 60 MG PO CPEP: 60.0000 mg | ORAL_CAPSULE | Freq: Every evening | ORAL | 0 refills | Status: DC

## 2017-04-22 NOTE — Telephone Encounter (Signed)
OK to refill duloxetine, per Dr. Benay Spice. Escribed to [facility]. Left message informing pt.

## 2017-04-22 NOTE — Telephone Encounter (Signed)
Pt called asking if Dr Benay Spice will refill her duloxetine. This has been rx by Dr Brigitte Pulse, her PCP, previously. Dr Brigitte Pulse will require her to be seen before she will fill it. This RN encouraged pt to make an appt with Dr Brigitte Pulse as it is advisable to keep PCP in the loop on her health care.  Pt was agreeable to making a PCP appt. In the meantime this RN will ask Dr Benay Spice if he will do a courtesy refill of the duloxetine.

## 2017-04-24 ENCOUNTER — Telehealth: Payer: Self-pay | Admitting: *Deleted

## 2017-04-24 MED ORDER — ONDANSETRON HCL 8 MG PO TABS: 8.0000 mg | ORAL_TABLET | Freq: Three times a day (TID) | ORAL | 1 refills | Status: DC | PRN

## 2017-04-24 NOTE — Telephone Encounter (Signed)
Discussed call with Ned Card, NP: Order received to HOLD Xeloda. Try Zofran for nausea. Escribed to [name]. Called pt, she voiced understanding. Instructed her to call office with update on 7/30.

## 2017-04-24 NOTE — Telephone Encounter (Signed)
Notified by triage RN pt called to report nausea and vomiting. Called pt, she reports nausea x5 days. Vomited 3x on 7/25 and again today despite taking Compazine. Reports she felt OK yesterday.  Pt denies constipation. Denies any neurological symptoms. Began Xeloda today.

## 2017-04-28 ENCOUNTER — Telehealth: Payer: Self-pay

## 2017-04-28 NOTE — Telephone Encounter (Signed)
Call placed to patient to inform her that per MD Benay Spice, it is ok to restart her Xeloda. Pt verbalizes understanding and states that she "will start back tonight".

## 2017-04-28 NOTE — Telephone Encounter (Signed)
Pt had c/o N/V on 7/27. She was instructed to stop xeloda at that time and was given a new zofran rx (she was on compazine with no benefit) Pt called to state she feels "a whole lot better". No problems since Saturday. She is asking if she should restart her xeloda now. She is on 7 days on 7 days off schedule. She had just started her week on xeloda on 7/27.

## 2017-05-04 ENCOUNTER — Telehealth: Payer: Self-pay | Admitting: Oncology

## 2017-05-04 ENCOUNTER — Ambulatory Visit: Payer: BLUE CROSS/BLUE SHIELD

## 2017-05-04 ENCOUNTER — Other Ambulatory Visit: Payer: Self-pay

## 2017-05-04 ENCOUNTER — Other Ambulatory Visit (HOSPITAL_BASED_OUTPATIENT_CLINIC_OR_DEPARTMENT_OTHER): Payer: BLUE CROSS/BLUE SHIELD

## 2017-05-04 ENCOUNTER — Ambulatory Visit (HOSPITAL_BASED_OUTPATIENT_CLINIC_OR_DEPARTMENT_OTHER): Payer: BLUE CROSS/BLUE SHIELD

## 2017-05-04 ENCOUNTER — Ambulatory Visit (HOSPITAL_BASED_OUTPATIENT_CLINIC_OR_DEPARTMENT_OTHER): Payer: BLUE CROSS/BLUE SHIELD | Admitting: Oncology

## 2017-05-04 VITALS — BP 134/85 | HR 68 | Temp 97.7°F | Resp 19 | Ht 65.0 in | Wt 126.3 lb

## 2017-05-04 DIAGNOSIS — D473 Essential (hemorrhagic) thrombocythemia: Secondary | ICD-10-CM | POA: Diagnosis not present

## 2017-05-04 DIAGNOSIS — C787 Secondary malignant neoplasm of liver and intrahepatic bile duct: Secondary | ICD-10-CM

## 2017-05-04 DIAGNOSIS — C786 Secondary malignant neoplasm of retroperitoneum and peritoneum: Secondary | ICD-10-CM | POA: Diagnosis not present

## 2017-05-04 DIAGNOSIS — C20 Malignant neoplasm of rectum: Secondary | ICD-10-CM

## 2017-05-04 DIAGNOSIS — C19 Malignant neoplasm of rectosigmoid junction: Secondary | ICD-10-CM

## 2017-05-04 DIAGNOSIS — M549 Dorsalgia, unspecified: Secondary | ICD-10-CM

## 2017-05-04 DIAGNOSIS — Z5112 Encounter for antineoplastic immunotherapy: Secondary | ICD-10-CM | POA: Diagnosis not present

## 2017-05-04 DIAGNOSIS — Z95828 Presence of other vascular implants and grafts: Secondary | ICD-10-CM

## 2017-05-04 DIAGNOSIS — Z933 Colostomy status: Secondary | ICD-10-CM | POA: Diagnosis not present

## 2017-05-04 DIAGNOSIS — D509 Iron deficiency anemia, unspecified: Secondary | ICD-10-CM

## 2017-05-04 LAB — COMPREHENSIVE METABOLIC PANEL
ALT: 12 U/L (ref 0–55)
ANION GAP: 6 meq/L (ref 3–11)
AST: 17 U/L (ref 5–34)
Albumin: 3.1 g/dL — ABNORMAL LOW (ref 3.5–5.0)
Alkaline Phosphatase: 143 U/L (ref 40–150)
BUN: 13.4 mg/dL (ref 7.0–26.0)
CALCIUM: 9 mg/dL (ref 8.4–10.4)
CHLORIDE: 104 meq/L (ref 98–109)
CO2: 29 mEq/L (ref 22–29)
Creatinine: 0.6 mg/dL (ref 0.6–1.1)
EGFR: >90 mL/min/{1.73_m2} (ref >90)
Glucose: 93 mg/dl (ref 70–140)
POTASSIUM: 3.9 meq/L (ref 3.5–5.1)
SODIUM: 139 meq/L (ref 136–145)
Total Bilirubin: 0.3 mg/dL (ref 0.20–1.20)
Total Protein: 6.1 g/dL — ABNORMAL LOW (ref 6.4–8.3)

## 2017-05-04 LAB — CBC WITH DIFFERENTIAL/PLATELET
BASO%: 0.7 % (ref 0.0–2.0)
BASOS ABS: 0 10*3/uL (ref 0.0–0.1)
EOS%: 5.1 % (ref 0.0–7.0)
Eosinophils Absolute: 0.3 10*3/uL (ref 0.0–0.5)
HCT: 41.6 % (ref 34.8–46.6)
HEMOGLOBIN: 13.2 g/dL (ref 11.6–15.9)
LYMPH%: 23 % (ref 14.0–49.7)
MCH: 28 pg (ref 25.1–34.0)
MCHC: 31.7 g/dL (ref 31.5–36.0)
MCV: 88.3 fL (ref 79.5–101.0)
MONO#: 0.6 10*3/uL (ref 0.1–0.9)
MONO%: 10.9 % (ref 0.0–14.0)
NEUT#: 3.3 10*3/uL (ref 1.5–6.5)
NEUT%: 60.3 % (ref 38.4–76.8)
Platelets: 368 10*3/uL (ref 145–400)
RBC: 4.71 10*6/uL (ref 3.70–5.45)
RDW: 18.5 % — AB (ref 11.2–14.5)
WBC: 5.5 10*3/uL (ref 3.9–10.3)
lymph#: 1.3 10*3/uL (ref 0.9–3.3)

## 2017-05-04 LAB — MAGNESIUM: MAGNESIUM: 2 mg/dL (ref 1.5–2.5)

## 2017-05-04 MED ORDER — CAPECITABINE 500 MG PO TABS: ORAL_TABLET | ORAL | 1 refills | Status: DC

## 2017-05-04 MED ORDER — SODIUM CHLORIDE 0.9 % IJ SOLN
10.0000 mL | INTRAMUSCULAR | Status: DC | PRN
  Administered 2017-05-04: 10 mL via INTRAVENOUS
  Filled 2017-05-04: qty 10

## 2017-05-04 MED ORDER — SODIUM CHLORIDE 0.9% FLUSH
10.0000 mL | INTRAVENOUS | Status: DC | PRN
  Administered 2017-05-04: 10 mL
  Filled 2017-05-04: qty 10

## 2017-05-04 MED ORDER — SODIUM CHLORIDE 0.9 % IV SOLN
300.0000 mg | Freq: Once | INTRAVENOUS | Status: AC
  Administered 2017-05-04: 300 mg via INTRAVENOUS
  Filled 2017-05-04: qty 15

## 2017-05-04 MED ORDER — SODIUM CHLORIDE 0.9 % IV SOLN
Freq: Once | INTRAVENOUS | Status: AC
  Administered 2017-05-04: 10:00:00 via INTRAVENOUS

## 2017-05-04 MED ORDER — HEPARIN SOD (PORK) LOCK FLUSH 100 UNIT/ML IV SOLN
500.0000 [IU] | Freq: Once | INTRAVENOUS | Status: AC | PRN
  Administered 2017-05-04: 500 [IU]
  Filled 2017-05-04: qty 5

## 2017-05-04 NOTE — Telephone Encounter (Signed)
Gave patient's husband avs and calendars for Aug and Sept

## 2017-05-04 NOTE — Patient Instructions (Signed)
[facility] Discharge Instructions for Patients Receiving Chemotherapy  Today you received the following chemotherapy agents Vectibix  To help prevent nausea and vomiting after your treatment, we encourage you to take your nausea medication as directed.   If you develop nausea and vomiting that is not controlled by your nausea medication, call the clinic.   BELOW ARE SYMPTOMS THAT SHOULD BE REPORTED IMMEDIATELY:  *FEVER GREATER THAN 100.5 F  *CHILLS WITH OR WITHOUT FEVER  NAUSEA AND VOMITING THAT IS NOT CONTROLLED WITH YOUR NAUSEA MEDICATION  *UNUSUAL SHORTNESS OF BREATH  *UNUSUAL BRUISING OR BLEEDING  TENDERNESS IN MOUTH AND THROAT WITH OR WITHOUT PRESENCE OF ULCERS  *URINARY PROBLEMS  *BOWEL PROBLEMS  UNUSUAL RASH Items with * indicate a potential emergency and should be followed up as soon as possible.  Feel free to call the clinic you have any questions or concerns. The clinic phone number is [phone].  Please show the CHEMO ALERT CARD at check-in to the Emergency Department and triage nurse.   

## 2017-05-04 NOTE — Progress Notes (Signed)
[place] OFFICE PROGRESS NOTE   Diagnosis: Colorectal cancer  INTERVAL HISTORY:   Carolyn Barton returns as scheduled. She continues Xeloda and panitumumab. No rash, mouth sores, or diarrhea. She had a few days of nausea/vomiting several weeks ago. This resolved. She saw Dr.Shen to discuss takedown of the colostomy.  Objective:  Vital signs in last 24 hours:  Blood pressure 134/85, pulse 68, temperature 97.7 F (36.5 C), temperature source Oral, resp. rate 19, height '5\' 5"'$  (1.651 m), weight 126 lb 4.8 oz (57.3 kg), SpO2 100 %.    HEENT: No thrush or ulcers Resp: Lungs clear bilaterally Cardio: Regular rate and rhythm GI: No hepatomegaly, left lower quadrant colostomy, nontender, no mass Vascular: No leg edema  Skin: No rash over the trunk or extremities. Palms and soles without erythema or skin breakdown. The eschar at the parietal scalp has almost completely healed   Portacath/PICC-without erythema  Lab Results:  Lab Results  Component Value Date   WBC 5.5 05/04/2017   HGB 13.2 05/04/2017   HCT 41.6 05/04/2017   MCV 88.3 05/04/2017   PLT 368 05/04/2017   NEUTROABS 3.3 05/04/2017    CMP     Component Value Date/Time   NA 139 05/04/2017 0829   K 3.9 05/04/2017 0829   CL 99 (L) 04/10/2016 1206   CL 101 12/01/2012 0924   CO2 29 05/04/2017 0829   GLUCOSE 93 05/04/2017 0829   GLUCOSE 94 12/01/2012 0924   BUN 13.4 05/04/2017 0829   CREATININE 0.6 05/04/2017 0829   CALCIUM 9.0 05/04/2017 0829   PROT 6.1 (L) 05/04/2017 0829   ALBUMIN 3.1 (L) 05/04/2017 0829   AST 17 05/04/2017 0829   ALT 12 05/04/2017 0829   ALKPHOS 143 05/04/2017 0829   BILITOT 0.30 05/04/2017 0829   GFRNONAA >60 04/10/2016 1206   GFRAA >60 04/10/2016 1206    Lab Results  Component Value Date   CEA1 <1.00 04/13/2017     Medications: I have reviewed the patient's current medications.  Assessment/Plan: 1. Metastatic colorectal cancer-status post an exploratory laparotomy  03/03/2016 revealing a proximal rectal mass, peritoneal carcinomatosis, and liver metastases  Biopsy of peritoneal nodules 03/03/2016 confirmed metastatic adenocarcinoma consistent with a colon primary  Foundation 1 testing-MSI-stable, tumor mutation burden-low, no BRAF NRAS or KRAS mutation  Cycle 1 FOLFIRI/PANITUMUMAB 04/14/2016  Cycle 2 FOLFIRI/panitumumab 04/28/2016  Cycle 3 FOLFIRI/panitumumab 05/12/2016  Cycle 4 FOLFIRI/panitumumab 05/27/2016  Cycle 5 FOLFIRI/panitumumab 06/09/2016  Restaging CT abdomen/pelvis 06/20/2016-no evidence of disease progression, decreased left hepatic lesion  Cycle 6 FOLFIRI/panitumumab 06/23/2016  Cycle 7 FOLFIRI/panitumumab 07/07/2016  Cycle 8 FOLFIRI/panitumumab 07/21/2016  Cycle 9 FOLFIRI/PANITUMUMAB 08/04/2016  Cycle 10 FOLFIRI/panitumumab 08/18/2016  Restaging CT 08/29/2016 with interval decrease in the size of the medial segment left liver lesion. Lesion identified previously in the rectosigmoid colon not evident on the current study.  5-FU/PANITUMUMAB 09/01/2016  Xeloda 7 days on/7 days off and PANITUMUMAB every 3 weeks 09/15/2016 (awaiting insurance approval for Xeloda 09/15/2016)  CT abdomen/pelvis 12/25/2016-unchanged 5 mm right hepatic lesion, resolution of medial segment left liver lesion, no new liver lesion. Residual soft tissue fullness in the sigmoid , persistent distal esophageal wall thickening  Continuationof PANITUMUMAB every 3 weeks and Xeloda 7 days on/7 days off  2.History of aBowel obstruction secondary to #1  3. Chronic back pain  4. Essential thrombocytosis  5. Early obstruction of the left ureter noted at the time of surgery 03/03/2016-Dr. Tannenbaum placed a left double-J stent 03/18/2016  6. Abdominal wall cellulitis 03/10/2016,  blood cultures positive for coagulase negative staphylococcus-methicillin-resistant, status post treatment with vancomycin and Bactrim  7. Tachycardia-persistent  following discharge from the hospital, likely related to anemia and deconditioning  CT chest 04/04/2016-negative for pulmonary embolism.  8. Port-A-Cath placement 04/10/2016, interventional radiology  9. Iron deficiency anemia. Feraheme 11/05/2015 , 11/12/2015,and 04/14/2016-persistent anemia and red cell microcytosis, oral iron resumed 11/17/2016-stable  10. Pain/tenderness left posterior iliac region-resolved  11. Hypokalemia-started on potassium replacement 06/09/2016  12. Skin rashand paronychiasecondary to [facility], she continues minocycline, moisturizers, and fluticasone    Disposition:  Carolyn Barton appears unchanged. The plan is to continue Xeloda/panitumumab. She is tolerating the treatment well. There is no clinical evidence of disease progression. We will plan for a restaging CT within the next few months.  We will follow-up on the CEA from today. She will contact us for recurrent nausea/vomiting.  She saw Dr.Shen to discuss the indication for resection of the primary tumor and reversal of the colostomy. She plans to follow-up with Dr. Brantley Stage within the next few months for further discussion. She would like to consider reversal of colostomy if significant morbidity is not expected.  We discussed systemic treatment options. She is in agreement with continuing Xeloda/panitumumab for now.  25 minutes were spent with the patient today. The majority of the time was used for counseling and coordination of care.  Donneta Romberg, MD  05/04/2017  9:59 AM

## 2017-05-24 ENCOUNTER — Other Ambulatory Visit: Payer: Self-pay | Admitting: Oncology

## 2017-05-25 ENCOUNTER — Ambulatory Visit: Payer: BLUE CROSS/BLUE SHIELD

## 2017-05-25 ENCOUNTER — Ambulatory Visit (HOSPITAL_BASED_OUTPATIENT_CLINIC_OR_DEPARTMENT_OTHER): Payer: BLUE CROSS/BLUE SHIELD

## 2017-05-25 ENCOUNTER — Other Ambulatory Visit (HOSPITAL_BASED_OUTPATIENT_CLINIC_OR_DEPARTMENT_OTHER): Payer: BLUE CROSS/BLUE SHIELD

## 2017-05-25 ENCOUNTER — Other Ambulatory Visit: Payer: Self-pay

## 2017-05-25 ENCOUNTER — Ambulatory Visit (HOSPITAL_BASED_OUTPATIENT_CLINIC_OR_DEPARTMENT_OTHER): Payer: BLUE CROSS/BLUE SHIELD | Admitting: Oncology

## 2017-05-25 ENCOUNTER — Telehealth: Payer: Self-pay | Admitting: Oncology

## 2017-05-25 VITALS — BP 128/86 | HR 75 | Temp 97.8°F | Resp 18 | Ht 65.0 in | Wt 126.9 lb

## 2017-05-25 DIAGNOSIS — C786 Secondary malignant neoplasm of retroperitoneum and peritoneum: Secondary | ICD-10-CM | POA: Diagnosis not present

## 2017-05-25 DIAGNOSIS — M549 Dorsalgia, unspecified: Secondary | ICD-10-CM

## 2017-05-25 DIAGNOSIS — C787 Secondary malignant neoplasm of liver and intrahepatic bile duct: Secondary | ICD-10-CM

## 2017-05-25 DIAGNOSIS — Z5112 Encounter for antineoplastic immunotherapy: Secondary | ICD-10-CM | POA: Diagnosis not present

## 2017-05-25 DIAGNOSIS — C20 Malignant neoplasm of rectum: Secondary | ICD-10-CM | POA: Diagnosis not present

## 2017-05-25 DIAGNOSIS — C19 Malignant neoplasm of rectosigmoid junction: Secondary | ICD-10-CM

## 2017-05-25 DIAGNOSIS — D473 Essential (hemorrhagic) thrombocythemia: Secondary | ICD-10-CM

## 2017-05-25 DIAGNOSIS — D509 Iron deficiency anemia, unspecified: Secondary | ICD-10-CM

## 2017-05-25 DIAGNOSIS — E876 Hypokalemia: Secondary | ICD-10-CM | POA: Diagnosis not present

## 2017-05-25 LAB — CBC WITH DIFFERENTIAL/PLATELET
BASO%: 0.3 % (ref 0.0–2.0)
Basophils Absolute: 0 10*3/uL (ref 0.0–0.1)
EOS%: 5.5 % (ref 0.0–7.0)
Eosinophils Absolute: 0.4 10*3/uL (ref 0.0–0.5)
HEMATOCRIT: 41.4 % (ref 34.8–46.6)
HGB: 13.1 g/dL (ref 11.6–15.9)
LYMPH#: 1.4 10*3/uL (ref 0.9–3.3)
LYMPH%: 20.6 % (ref 14.0–49.7)
MCH: 28.2 pg (ref 25.1–34.0)
MCHC: 31.6 g/dL (ref 31.5–36.0)
MCV: 89.2 fL (ref 79.5–101.0)
MONO#: 0.8 10*3/uL (ref 0.1–0.9)
MONO%: 12.2 % (ref 0.0–14.0)
NEUT#: 4 10*3/uL (ref 1.5–6.5)
NEUT%: 61.4 % (ref 38.4–76.8)
Platelets: 349 10*3/uL (ref 145–400)
RBC: 4.64 10*6/uL (ref 3.70–5.45)
RDW: 17.9 % — ABNORMAL HIGH (ref 11.2–14.5)
WBC: 6.6 10*3/uL (ref 3.9–10.3)

## 2017-05-25 LAB — COMPREHENSIVE METABOLIC PANEL
ALT: 12 U/L (ref 0–55)
AST: 20 U/L (ref 5–34)
Albumin: 3.1 g/dL — ABNORMAL LOW (ref 3.5–5.0)
Alkaline Phosphatase: 138 U/L (ref 40–150)
Anion Gap: 7 mEq/L (ref 3–11)
BUN: 8.5 mg/dL (ref 7.0–26.0)
CALCIUM: 8.9 mg/dL (ref 8.4–10.4)
CHLORIDE: 104 meq/L (ref 98–109)
CO2: 28 meq/L (ref 22–29)
CREATININE: 0.5 mg/dL — AB (ref 0.6–1.1)
EGFR: >90 mL/min/{1.73_m2} (ref >90)
GLUCOSE: 81 mg/dL (ref 70–140)
Potassium: 3.8 mEq/L (ref 3.5–5.1)
Sodium: 139 mEq/L (ref 136–145)
Total Bilirubin: 0.43 mg/dL (ref 0.20–1.20)
Total Protein: 6.4 g/dL (ref 6.4–8.3)

## 2017-05-25 LAB — MAGNESIUM: Magnesium: 2.1 mg/dl (ref 1.5–2.5)

## 2017-05-25 LAB — CEA (IN HOUSE-CHCC): CEA (CHCC-In House): 1.04 ng/mL (ref 0.00–5.00)

## 2017-05-25 MED ORDER — HEPARIN SOD (PORK) LOCK FLUSH 100 UNIT/ML IV SOLN
500.0000 [IU] | Freq: Once | INTRAVENOUS | Status: AC | PRN
  Administered 2017-05-25: 500 [IU]
  Filled 2017-05-25: qty 5

## 2017-05-25 MED ORDER — SODIUM CHLORIDE 0.9 % IV SOLN
5.5000 mg/kg | Freq: Once | INTRAVENOUS | Status: AC
  Administered 2017-05-25: 300 mg via INTRAVENOUS
  Filled 2017-05-25: qty 15

## 2017-05-25 MED ORDER — SODIUM CHLORIDE 0.9 % IJ SOLN
10.0000 mL | INTRAMUSCULAR | Status: AC | PRN
  Administered 2017-05-25 (×2): 10 mL via INTRAVENOUS
  Filled 2017-05-25: qty 10

## 2017-05-25 MED ORDER — DULOXETINE HCL 60 MG PO CPEP: 60.0000 mg | ORAL_CAPSULE | Freq: Every evening | ORAL | 0 refills | Status: DC

## 2017-05-25 MED ORDER — SODIUM CHLORIDE 0.9% FLUSH
10.0000 mL | INTRAVENOUS | Status: DC | PRN
  Filled 2017-05-25: qty 10

## 2017-05-25 MED ORDER — SODIUM CHLORIDE 0.9 % IV SOLN
Freq: Once | INTRAVENOUS | Status: AC
  Administered 2017-05-25: 11:00:00 via INTRAVENOUS

## 2017-05-25 NOTE — Patient Instructions (Signed)
Implanted Port Home Guide An implanted port is a type of central line that is placed under the skin. Central lines are used to provide IV access when treatment or nutrition needs to be given through a person's veins. Implanted ports are used for long-term IV access. An implanted port may be placed because:  You need IV medicine that would be irritating to the small veins in your hands or arms.  You need long-term IV medicines, such as antibiotics.  You need IV nutrition for a long period.  You need frequent blood draws for lab tests.  You need dialysis.  Implanted ports are usually placed in the chest area, but they can also be placed in the upper arm, the abdomen, or the leg. An implanted port has two main parts:  Reservoir. The reservoir is round and will appear as a small, raised area under your skin. The reservoir is the part where a needle is inserted to give medicines or draw blood.  Catheter. The catheter is a thin, flexible tube that extends from the reservoir. The catheter is placed into a large vein. Medicine that is inserted into the reservoir goes into the catheter and then into the vein.  How will I care for my incision site? Do not get the incision site wet. Bathe or shower as directed by your health care provider. How is my port accessed? Special steps must be taken to access the port:  Before the port is accessed, a numbing cream can be placed on the skin. This helps numb the skin over the port site.  Your health care provider uses a sterile technique to access the port. ? Your health care provider must put on a mask and sterile gloves. ? The skin over your port is cleaned carefully with an antiseptic and allowed to dry. ? The port is gently pinched between sterile gloves, and a needle is inserted into the port.  Only "non-coring" port needles should be used to access the port. Once the port is accessed, a blood return should be checked. This helps ensure that the port  is in the vein and is not clogged.  If your port needs to remain accessed for a constant infusion, a clear (transparent) bandage will be placed over the needle site. The bandage and needle will need to be changed every week, or as directed by your health care provider.  Keep the bandage covering the needle clean and dry. Do not get it wet. Follow your health care provider's instructions on how to take a shower or bath while the port is accessed.  If your port does not need to stay accessed, no bandage is needed over the port.  What is flushing? Flushing helps keep the port from getting clogged. Follow your health care provider's instructions on how and when to flush the port. Ports are usually flushed with saline solution or a medicine called heparin. The need for flushing will depend on how the port is used.  If the port is used for intermittent medicines or blood draws, the port will need to be flushed: ? After medicines have been given. ? After blood has been drawn. ? As part of routine maintenance.  If a constant infusion is running, the port may not need to be flushed.  How long will my port stay implanted? The port can stay in for as long as your health care provider thinks it is needed. When it is time for the port to come out, surgery will be   done to remove it. The procedure is similar to the one performed when the port was put in. When should I seek immediate medical care? When you have an implanted port, you should seek immediate medical care if:  You notice a bad smell coming from the incision site.  You have swelling, redness, or drainage at the incision site.  You have more swelling or pain at the port site or the surrounding area.  You have a fever that is not controlled with medicine.  This information is not intended to replace advice given to you by your health care provider. Make sure you discuss any questions you have with your health care provider. Document  Released: 09/15/2005 Document Revised: 02/21/2016 Document Reviewed: 05/23/2013 Elsevier Interactive Patient Education  2017 [employer]  

## 2017-05-25 NOTE — Patient Instructions (Signed)
[place] Discharge Instructions for Patients Receiving Chemotherapy  Today you received the following chemotherapy agents Panitumamab  To help prevent nausea and vomiting after your treatment, we encourage you to take your nausea medication as directed If you develop nausea and vomiting that is not controlled by your nausea medication, call the clinic.   BELOW ARE SYMPTOMS THAT SHOULD BE REPORTED IMMEDIATELY:  *FEVER GREATER THAN 100.5 F  *CHILLS WITH OR WITHOUT FEVER  NAUSEA AND VOMITING THAT IS NOT CONTROLLED WITH YOUR NAUSEA MEDICATION  *UNUSUAL SHORTNESS OF BREATH  *UNUSUAL BRUISING OR BLEEDING  TENDERNESS IN MOUTH AND THROAT WITH OR WITHOUT PRESENCE OF ULCERS  *URINARY PROBLEMS  *BOWEL PROBLEMS  UNUSUAL RASH Items with * indicate a potential emergency and should be followed up as soon as possible.  Feel free to call the clinic you have any questions or concerns. The clinic phone number is [phone].  Please show the [place] at check-in to the Emergency Department and triage nurse.

## 2017-05-25 NOTE — Telephone Encounter (Signed)
Gave patient AVS report and calendar of upcoming October appointments.

## 2017-05-25 NOTE — Progress Notes (Signed)
[facility] OFFICE PROGRESS NOTE   Diagnosis: Colon cancer  INTERVAL HISTORY:   Carolyn Barton returns as scheduled. She continues Xeloda/panitumumab. No recurrent abdominal pain. She has intermittent back pain. Intermittent diarrhea is relieved with Imodium. Good appetite and energy level. No new complaint.  Objective:  Vital signs in last 24 hours:  Blood pressure 128/86, pulse 75, temperature 97.8 F (36.6 C), temperature source Oral, resp. rate 18, height 5' 5" (1.651 m), weight 126 lb 14.4 oz (57.6 kg), SpO2 100 %.    HEENT: No thrush or ulcers Resp: Lungs clear bilaterally Cardio: Regular rate and rhythm GI: No hepatosplenomegaly, left lower quadrant colostomy, nontender Vascular: No leg edema  Skin: Palms and soles without erythema or skin breakdown. No paronychia. Scalp lesion has almost completely healed   Portacath/PICC-without erythema  Lab Results:  Lab Results  Component Value Date   WBC 6.6 05/25/2017   HGB 13.1 05/25/2017   HCT 41.4 05/25/2017   MCV 89.2 05/25/2017   PLT 349 05/25/2017   NEUTROABS 4.0 05/25/2017    CMP     Component Value Date/Time   NA 139 05/04/2017 0829   K 3.9 05/04/2017 0829   CL 99 (L) 04/10/2016 1206   CL 101 12/01/2012 0924   CO2 29 05/04/2017 0829   GLUCOSE 93 05/04/2017 0829   GLUCOSE 94 12/01/2012 0924   BUN 13.4 05/04/2017 0829   CREATININE 0.6 05/04/2017 0829   CALCIUM 9.0 05/04/2017 0829   PROT 6.1 (L) 05/04/2017 0829   ALBUMIN 3.1 (L) 05/04/2017 0829   AST 17 05/04/2017 0829   ALT 12 05/04/2017 0829   ALKPHOS 143 05/04/2017 0829   BILITOT 0.30 05/04/2017 0829   GFRNONAA >60 04/10/2016 1206   GFRAA >60 04/10/2016 1206    Lab Results  Component Value Date   CEA1 <1.00 04/13/2017     Medications: I have reviewed the patient's current medications.  Assessment/Plan: 1. Metastatic colorectal cancer-status post an exploratory laparotomy 03/03/2016 revealing a proximal rectal mass, peritoneal  carcinomatosis, and liver metastases  Biopsy of peritoneal nodules 03/03/2016 confirmed metastatic adenocarcinoma consistent with a colon primary  Foundation 1 testing-MSI-stable, tumor mutation burden-low, no BRAF NRAS or KRAS mutation  Cycle 1 FOLFIRI/PANITUMUMAB 04/14/2016  Cycle 2 FOLFIRI/panitumumab 04/28/2016  Cycle 3 FOLFIRI/panitumumab 05/12/2016  Cycle 4 FOLFIRI/panitumumab 05/27/2016  Cycle 5 FOLFIRI/panitumumab 06/09/2016  Restaging CT abdomen/pelvis 06/20/2016-no evidence of disease progression, decreased left hepatic lesion  Cycle 6 FOLFIRI/panitumumab 06/23/2016  Cycle 7 FOLFIRI/panitumumab 07/07/2016  Cycle 8 FOLFIRI/panitumumab 07/21/2016  Cycle 9 FOLFIRI/PANITUMUMAB 08/04/2016  Cycle 10 FOLFIRI/panitumumab 08/18/2016  Restaging CT 08/29/2016 with interval decrease in the size of the medial segment left liver lesion. Lesion identified previously in the rectosigmoid colon not evident on the current study.  5-FU/PANITUMUMAB 09/01/2016  Xeloda 7 days on/7 days off and PANITUMUMAB every 3 weeks 09/15/2016 (awaiting insurance approval for Xeloda 09/15/2016)  CT abdomen/pelvis 12/25/2016-unchanged 5 mm right hepatic lesion, resolution of medial segment left liver lesion, no new liver lesion. Residual soft tissue fullness in the sigmoid , persistent distal esophageal wall thickening  Continuationof PANITUMUMAB every 3 weeks and Xeloda 7 days on/7 days off  2.History of aBowel obstruction secondary to #1  3. Chronic back pain  4. Essential thrombocytosis  5. Early obstruction of the left ureter noted at the time of surgery 03/03/2016-Dr. Tannenbaum placed a left double-J stent 03/18/2016  6. Abdominal wall cellulitis 03/10/2016, blood cultures positive for coagulase negative staphylococcus-methicillin-resistant, status post treatment with vancomycin and Bactrim  7. Tachycardia-persistent  following discharge from the hospital, likely related to  anemia and deconditioning  CT chest 04/04/2016-negative for pulmonary embolism.  8. Port-A-Cath placement 04/10/2016, interventional radiology  9. Iron deficiency anemia. Feraheme 11/05/2015 , 11/12/2015,and 04/14/2016-persistent anemia and red cell microcytosis, oral iron resumed 11/17/2016-stable  10. Pain/tenderness left posterior iliac region-resolved  11. Hypokalemia-started on potassium replacement 06/09/2016  12. Skin rashand paronychiasecondary to [facility], she continues minocycline, moisturizers, and fluticasone    Disposition:  Carolyn Barton appears unchanged. She is tolerating the Xeloda/panitumumab well. There is no clinical evidence for disease progression. She will return for an office visit and panitumumab in 3 weeks.  15 minutes were spent with the patient today. The majority of the time was used for counseling and coordination of care.  Donneta Romberg, MD  05/25/2017  9:28 AM

## 2017-06-12 ENCOUNTER — Other Ambulatory Visit: Payer: Self-pay | Admitting: Oncology

## 2017-06-15 ENCOUNTER — Other Ambulatory Visit (HOSPITAL_BASED_OUTPATIENT_CLINIC_OR_DEPARTMENT_OTHER): Payer: BLUE CROSS/BLUE SHIELD

## 2017-06-15 ENCOUNTER — Ambulatory Visit (HOSPITAL_BASED_OUTPATIENT_CLINIC_OR_DEPARTMENT_OTHER): Payer: BLUE CROSS/BLUE SHIELD

## 2017-06-15 ENCOUNTER — Ambulatory Visit (HOSPITAL_BASED_OUTPATIENT_CLINIC_OR_DEPARTMENT_OTHER): Payer: BLUE CROSS/BLUE SHIELD | Admitting: Oncology

## 2017-06-15 ENCOUNTER — Ambulatory Visit: Payer: BLUE CROSS/BLUE SHIELD

## 2017-06-15 ENCOUNTER — Telehealth: Payer: Self-pay | Admitting: Oncology

## 2017-06-15 VITALS — BP 130/90 | HR 70 | Temp 98.4°F | Resp 18 | Ht 65.0 in | Wt 127.2 lb

## 2017-06-15 DIAGNOSIS — C20 Malignant neoplasm of rectum: Secondary | ICD-10-CM

## 2017-06-15 DIAGNOSIS — E876 Hypokalemia: Secondary | ICD-10-CM

## 2017-06-15 DIAGNOSIS — C787 Secondary malignant neoplasm of liver and intrahepatic bile duct: Secondary | ICD-10-CM | POA: Diagnosis not present

## 2017-06-15 DIAGNOSIS — R21 Rash and other nonspecific skin eruption: Secondary | ICD-10-CM | POA: Diagnosis not present

## 2017-06-15 DIAGNOSIS — C786 Secondary malignant neoplasm of retroperitoneum and peritoneum: Secondary | ICD-10-CM

## 2017-06-15 DIAGNOSIS — C19 Malignant neoplasm of rectosigmoid junction: Secondary | ICD-10-CM

## 2017-06-15 DIAGNOSIS — Z5112 Encounter for antineoplastic immunotherapy: Secondary | ICD-10-CM

## 2017-06-15 DIAGNOSIS — R197 Diarrhea, unspecified: Secondary | ICD-10-CM

## 2017-06-15 DIAGNOSIS — M549 Dorsalgia, unspecified: Secondary | ICD-10-CM | POA: Diagnosis not present

## 2017-06-15 DIAGNOSIS — D473 Essential (hemorrhagic) thrombocythemia: Secondary | ICD-10-CM

## 2017-06-15 DIAGNOSIS — R109 Unspecified abdominal pain: Secondary | ICD-10-CM

## 2017-06-15 DIAGNOSIS — D509 Iron deficiency anemia, unspecified: Secondary | ICD-10-CM

## 2017-06-15 LAB — CBC WITH DIFFERENTIAL/PLATELET
BASO%: 0.8 % (ref 0.0–2.0)
Basophils Absolute: 0 10*3/uL (ref 0.0–0.1)
EOS%: 6 % (ref 0.0–7.0)
Eosinophils Absolute: 0.3 10*3/uL (ref 0.0–0.5)
HCT: 41.3 % (ref 34.8–46.6)
HEMOGLOBIN: 13.6 g/dL (ref 11.6–15.9)
LYMPH%: 23.4 % (ref 14.0–49.7)
MCH: 28.8 pg (ref 25.1–34.0)
MCHC: 32.9 g/dL (ref 31.5–36.0)
MCV: 87.5 fL (ref 79.5–101.0)
MONO#: 0.6 10*3/uL (ref 0.1–0.9)
MONO%: 10.8 % (ref 0.0–14.0)
NEUT%: 59 % (ref 38.4–76.8)
NEUTROS ABS: 3.1 10*3/uL (ref 1.5–6.5)
Platelets: 349 10*3/uL (ref 145–400)
RBC: 4.72 10*6/uL (ref 3.70–5.45)
RDW: 18.4 % — ABNORMAL HIGH (ref 11.2–14.5)
WBC: 5.2 10*3/uL (ref 3.9–10.3)
lymph#: 1.2 10*3/uL (ref 0.9–3.3)

## 2017-06-15 LAB — COMPREHENSIVE METABOLIC PANEL
ALBUMIN: 3.2 g/dL — AB (ref 3.5–5.0)
ALK PHOS: 139 U/L (ref 40–150)
ALT: 10 U/L (ref 0–55)
AST: 17 U/L (ref 5–34)
Anion Gap: 8 mEq/L (ref 3–11)
BILIRUBIN TOTAL: 0.37 mg/dL (ref 0.20–1.20)
BUN: 8.8 mg/dL (ref 7.0–26.0)
CALCIUM: 9.1 mg/dL (ref 8.4–10.4)
CO2: 28 mEq/L (ref 22–29)
Chloride: 103 mEq/L (ref 98–109)
Creatinine: 0.6 mg/dL (ref 0.6–1.1)
GLUCOSE: 72 mg/dL (ref 70–140)
Potassium: 3.9 mEq/L (ref 3.5–5.1)
SODIUM: 139 meq/L (ref 136–145)
TOTAL PROTEIN: 6.4 g/dL (ref 6.4–8.3)

## 2017-06-15 LAB — CEA (IN HOUSE-CHCC): CEA (CHCC-In House): <1 ng/mL (ref 0.00–5.00)

## 2017-06-15 LAB — MAGNESIUM: MAGNESIUM: 2.3 mg/dL (ref 1.5–2.5)

## 2017-06-15 MED ORDER — SODIUM CHLORIDE 0.9 % IV SOLN
Freq: Once | INTRAVENOUS | Status: AC
  Administered 2017-06-15: 10:00:00 via INTRAVENOUS

## 2017-06-15 MED ORDER — SODIUM CHLORIDE 0.9% FLUSH
10.0000 mL | INTRAVENOUS | Status: DC | PRN
  Administered 2017-06-15: 10 mL
  Filled 2017-06-15: qty 10

## 2017-06-15 MED ORDER — PANITUMUMAB CHEMO INJECTION 100 MG/5ML
5.8000 mg/kg | Freq: Once | INTRAVENOUS | Status: AC
  Administered 2017-06-15: 300 mg via INTRAVENOUS
  Filled 2017-06-15: qty 15

## 2017-06-15 MED ORDER — SODIUM CHLORIDE 0.9 % IJ SOLN
10.0000 mL | INTRAMUSCULAR | Status: AC | PRN
  Administered 2017-06-15: 10 mL
  Filled 2017-06-15: qty 10

## 2017-06-15 MED ORDER — HEPARIN SOD (PORK) LOCK FLUSH 100 UNIT/ML IV SOLN
500.0000 [IU] | Freq: Once | INTRAVENOUS | Status: AC | PRN
  Administered 2017-06-15: 500 [IU]
  Filled 2017-06-15: qty 5

## 2017-06-15 MED ORDER — DULOXETINE HCL 60 MG PO CPEP: 60.0000 mg | ORAL_CAPSULE | Freq: Every evening | ORAL | 3 refills | Status: AC

## 2017-06-15 NOTE — Telephone Encounter (Signed)
Scheduled appt per 9/17 los - Gave patient AVS and calender per los.  

## 2017-06-15 NOTE — Progress Notes (Signed)
[facility] OFFICE PROGRESS NOTE   Diagnosis: Colon cancer  INTERVAL HISTORY:   Carolyn Barton returns as scheduled. She completed another treatment with panitumumab on 05/25/2017. She continues Xeloda. She reports diarrhea approximately one time per day. Her chief complaint is intermittent abdominal pain. The pain was severe over the weekend. The pain chiefly occurs once daily in last for approximately 20 minutes. The pain is relieved after she has diarrhea. No nausea or vomiting. Stable skin rash.   Objective:  Vital signs in last 24 hours:  Blood pressure 130/90, pulse 70, temperature 98.4 F (36.9 C), temperature source Oral, resp. rate 18, height _0  (1.651 m), weight 127 lb 3.2 oz (57.7 kg), SpO2 100 %.    HEENT: No thrush or ulcers Resp: Lungs clear bilaterally Cardio: Regular rate and rhythm GI: No hepatosplenomegaly, no mass, left lower quadrant colostomy, nontender Vascular: No leg edema  Skin: Plaque of dried faint erythema at the right lower back, few acne-type lesions over the trunk, hands without erythema or skin breakdown   Portacath/PICC-without erythema  Lab Results:  Lab Results  Component Value Date   WBC 5.2 06/15/2017   HGB 13.6 06/15/2017   HCT 41.3 06/15/2017   MCV 87.5 06/15/2017   PLT 349 06/15/2017   NEUTROABS 3.1 06/15/2017    CMP     Component Value Date/Time   NA 139 05/25/2017 0840   K 3.8 05/25/2017 0840   CL 99 (L) 04/10/2016 1206   CL 101 12/01/2012 0924   CO2 28 05/25/2017 0840   GLUCOSE 81 05/25/2017 0840   GLUCOSE 94 12/01/2012 0924   BUN 8.5 05/25/2017 0840   CREATININE 0.5 (L) 05/25/2017 0840   CALCIUM 8.9 05/25/2017 0840   PROT 6.4 05/25/2017 0840   ALBUMIN 3.1 (L) 05/25/2017 0840   AST 20 05/25/2017 0840   ALT 12 05/25/2017 0840   ALKPHOS 138 05/25/2017 0840   BILITOT 0.43 05/25/2017 0840   GFRNONAA >60 04/10/2016 1206   GFRAA >60 04/10/2016 1206    Lab Results  Component Value Date   CEA1 1.04  05/25/2017     Medications: I have reviewed the patient's current medications.  Assessment/Plan: 1. Metastatic colorectal cancer-status post an exploratory laparotomy 03/03/2016 revealing a proximal rectal mass, peritoneal carcinomatosis, and liver metastases  Biopsy of peritoneal nodules 03/03/2016 confirmed metastatic adenocarcinoma consistent with a colon primary  Foundation 1 testing-MSI-stable, tumor mutation burden-low, no BRAF NRAS or KRAS mutation  Cycle 1 FOLFIRI/PANITUMUMAB 04/14/2016  Cycle 2 FOLFIRI/panitumumab 04/28/2016  Cycle 3 FOLFIRI/panitumumab 05/12/2016  Cycle 4 FOLFIRI/panitumumab 05/27/2016  Cycle 5 FOLFIRI/panitumumab 06/09/2016  Restaging CT abdomen/pelvis 06/20/2016-no evidence of disease progression, decreased left hepatic lesion  Cycle 6 FOLFIRI/panitumumab 06/23/2016  Cycle 7 FOLFIRI/panitumumab 07/07/2016  Cycle 8 FOLFIRI/panitumumab 07/21/2016  Cycle 9 FOLFIRI/PANITUMUMAB 08/04/2016  Cycle 10 FOLFIRI/panitumumab 08/18/2016  Restaging CT 08/29/2016 with interval decrease in the size of the medial segment left liver lesion. Lesion identified previously in the rectosigmoid colon not evident on the current study.  5-FU/PANITUMUMAB 09/01/2016  Xeloda 7 days on/7 days off and PANITUMUMAB every 3 weeks 09/15/2016 (awaiting insurance approval for Xeloda 09/15/2016)  CT abdomen/pelvis 12/25/2016-unchanged 5 mm right hepatic lesion, resolution of medial segment left liver lesion, no new liver lesion. Residual soft tissue fullness in the sigmoid , persistent distal esophageal wall thickening  Continuationof PANITUMUMAB every 3 weeks and Xeloda 7 days on/7 days off  2.History of aBowel obstruction secondary to #1  3. Chronic back pain  4. Essential thrombocytosis  5.  Early obstruction of the left ureter noted at the time of surgery 03/03/2016-Dr. Tannenbaum placed a left double-J stent 03/18/2016  6. Abdominal wall cellulitis  03/10/2016, blood cultures positive for coagulase negative staphylococcus-methicillin-resistant, status post treatment with vancomycin and Bactrim  7. Tachycardia-persistent following discharge from the hospital, likely related to anemia and deconditioning  CT chest 04/04/2016-negative for pulmonary embolism.  8. Port-A-Cath placement 04/10/2016, interventional radiology  9. Iron deficiency anemia. Feraheme 11/05/2015 , 11/12/2015,and 04/14/2016-persistent anemia and red cell microcytosis, oral iron resumed 11/17/2016-stable  10. Pain/tenderness left posterior iliac region-resolved  11. Hypokalemia-started on potassium replacement 06/09/2016  12. Skin rashand paronychiasecondary to [facility], she continues minocycline, moisturizers, and fluticasone   Disposition:  Carolyn Barton continues treatment with Xeloda/panitumumab. There has been no evidence of disease progression while on this regimen. The CEA remains normal. She will continue the current treatment.  The etiology of the recurrent abdominal pain is unclear. This could be related to intermittent transient obstruction. I discussed the differential diagnosis with Ms. Gambone and her husband. She will be referred for a restaging CT abdomen/pelvis. We will arrange for surgical consultation as indicated.  She will return for an office visit and panitumumab in 3 weeks.  25 minutes were spent with the patient today. The majority of the time was used for counseling and coordination of care.  Donneta Romberg, MD  06/15/2017  9:08 AM

## 2017-06-15 NOTE — Patient Instructions (Signed)
[facility] Discharge Instructions for Patients Receiving Chemotherapy  Today you received the following chemotherapy agents: Panitumumab   To help prevent nausea and vomiting after your treatment, we encourage you to take your nausea medication as directed.    If you develop nausea and vomiting that is not controlled by your nausea medication, call the clinic.   BELOW ARE SYMPTOMS THAT SHOULD BE REPORTED IMMEDIATELY:  *FEVER GREATER THAN 100.5 F  *CHILLS WITH OR WITHOUT FEVER  NAUSEA AND VOMITING THAT IS NOT CONTROLLED WITH YOUR NAUSEA MEDICATION  *UNUSUAL SHORTNESS OF BREATH  *UNUSUAL BRUISING OR BLEEDING  TENDERNESS IN MOUTH AND THROAT WITH OR WITHOUT PRESENCE OF ULCERS  *URINARY PROBLEMS  *BOWEL PROBLEMS  UNUSUAL RASH Items with * indicate a potential emergency and should be followed up as soon as possible.  Feel free to call the clinic you have any questions or concerns. The clinic phone number is [phone].  Please show the [place] at check-in to the Emergency Department and triage nurse.

## 2017-06-19 ENCOUNTER — Ambulatory Visit (HOSPITAL_COMMUNITY)
Admission: RE | Admit: 2017-06-19 | Discharge: 2017-06-19 | Disposition: A | Discharge status: Home / Self Care | Payer: BLUE CROSS/BLUE SHIELD | Source: Ambulatory Visit | Attending: Oncology | Admitting: Oncology

## 2017-06-19 DIAGNOSIS — C785 Secondary malignant neoplasm of large intestine and rectum: Secondary | ICD-10-CM | POA: Diagnosis present

## 2017-06-19 DIAGNOSIS — N83202 Unspecified ovarian cyst, left side: Secondary | ICD-10-CM | POA: Diagnosis not present

## 2017-06-19 DIAGNOSIS — C19 Malignant neoplasm of rectosigmoid junction: Secondary | ICD-10-CM

## 2017-06-19 MED ORDER — IOPAMIDOL (ISOVUE-300) INJECTION 61%
30.0000 mL | Freq: Once | INTRAVENOUS | Status: AC | PRN
  Administered 2017-06-19: 30 mL via ORAL

## 2017-06-19 MED ORDER — IOPAMIDOL (ISOVUE-300) INJECTION 61%
INTRAVENOUS | Status: AC
  Administered 2017-06-19: 30 mL via ORAL
  Filled 2017-06-19: qty 30

## 2017-06-19 MED ORDER — IOPAMIDOL (ISOVUE-300) INJECTION 61%
INTRAVENOUS | Status: AC
  Filled 2017-06-19: qty 100

## 2017-06-19 MED ORDER — IOPAMIDOL (ISOVUE-300) INJECTION 61%
100.0000 mL | Freq: Once | INTRAVENOUS | Status: AC | PRN
  Administered 2017-06-19: 100 mL via INTRAVENOUS

## 2017-06-30 ENCOUNTER — Other Ambulatory Visit: Payer: Self-pay | Admitting: Oncology

## 2017-07-01 ENCOUNTER — Telehealth: Payer: Self-pay | Admitting: Oncology

## 2017-07-01 NOTE — Telephone Encounter (Signed)
06/30/17  Prescription refill scanned in media tab

## 2017-07-02 ENCOUNTER — Other Ambulatory Visit: Payer: Self-pay | Admitting: *Deleted

## 2017-07-02 DIAGNOSIS — C19 Malignant neoplasm of rectosigmoid junction: Secondary | ICD-10-CM

## 2017-07-02 MED ORDER — CAPECITABINE 500 MG PO TABS: ORAL_TABLET | ORAL | 1 refills | Status: DC

## 2017-07-06 ENCOUNTER — Ambulatory Visit: Payer: BLUE CROSS/BLUE SHIELD

## 2017-07-06 ENCOUNTER — Other Ambulatory Visit (HOSPITAL_BASED_OUTPATIENT_CLINIC_OR_DEPARTMENT_OTHER): Payer: BLUE CROSS/BLUE SHIELD

## 2017-07-06 ENCOUNTER — Ambulatory Visit (HOSPITAL_BASED_OUTPATIENT_CLINIC_OR_DEPARTMENT_OTHER): Payer: BLUE CROSS/BLUE SHIELD | Admitting: Oncology

## 2017-07-06 ENCOUNTER — Other Ambulatory Visit: Payer: Self-pay

## 2017-07-06 ENCOUNTER — Telehealth: Payer: Self-pay | Admitting: Oncology

## 2017-07-06 ENCOUNTER — Ambulatory Visit (HOSPITAL_BASED_OUTPATIENT_CLINIC_OR_DEPARTMENT_OTHER): Payer: BLUE CROSS/BLUE SHIELD

## 2017-07-06 VITALS — BP 129/79 | HR 75 | Temp 98.4°F | Resp 24 | Ht 65.0 in | Wt 128.2 lb

## 2017-07-06 DIAGNOSIS — C787 Secondary malignant neoplasm of liver and intrahepatic bile duct: Secondary | ICD-10-CM | POA: Diagnosis not present

## 2017-07-06 DIAGNOSIS — C20 Malignant neoplasm of rectum: Secondary | ICD-10-CM

## 2017-07-06 DIAGNOSIS — D509 Iron deficiency anemia, unspecified: Secondary | ICD-10-CM

## 2017-07-06 DIAGNOSIS — R109 Unspecified abdominal pain: Secondary | ICD-10-CM | POA: Diagnosis not present

## 2017-07-06 DIAGNOSIS — Z23 Encounter for immunization: Secondary | ICD-10-CM

## 2017-07-06 DIAGNOSIS — R1909 Other intra-abdominal and pelvic swelling, mass and lump: Secondary | ICD-10-CM

## 2017-07-06 DIAGNOSIS — C786 Secondary malignant neoplasm of retroperitoneum and peritoneum: Secondary | ICD-10-CM

## 2017-07-06 DIAGNOSIS — Z5112 Encounter for antineoplastic immunotherapy: Secondary | ICD-10-CM | POA: Diagnosis not present

## 2017-07-06 LAB — CBC WITH DIFFERENTIAL/PLATELET
BASO%: 1.3 % (ref 0.0–2.0)
BASOS ABS: 0.1 10*3/uL (ref 0.0–0.1)
EOS ABS: 0.3 10*3/uL (ref 0.0–0.5)
EOS%: 6 % (ref 0.0–7.0)
HEMATOCRIT: 40.3 % (ref 34.8–46.6)
HEMOGLOBIN: 13.2 g/dL (ref 11.6–15.9)
LYMPH#: 1.2 10*3/uL (ref 0.9–3.3)
LYMPH%: 24 % (ref 14.0–49.7)
MCH: 28.8 pg (ref 25.1–34.0)
MCHC: 32.6 g/dL (ref 31.5–36.0)
MCV: 88.2 fL (ref 79.5–101.0)
MONO#: 0.5 10*3/uL (ref 0.1–0.9)
MONO%: 10.7 % (ref 0.0–14.0)
NEUT%: 58 % (ref 38.4–76.8)
NEUTROS ABS: 3 10*3/uL (ref 1.5–6.5)
PLATELETS: 359 10*3/uL (ref 145–400)
RBC: 4.58 10*6/uL (ref 3.70–5.45)
RDW: 18.2 % — AB (ref 11.2–14.5)
WBC: 5.1 10*3/uL (ref 3.9–10.3)

## 2017-07-06 LAB — COMPREHENSIVE METABOLIC PANEL
ALBUMIN: 3.3 g/dL — AB (ref 3.5–5.0)
ALK PHOS: 106 U/L (ref 40–150)
ALT: 12 U/L (ref 0–55)
AST: 19 U/L (ref 5–34)
Anion Gap: 7 mEq/L (ref 3–11)
BILIRUBIN TOTAL: 0.36 mg/dL (ref 0.20–1.20)
BUN: 10.3 mg/dL (ref 7.0–26.0)
CALCIUM: 9.1 mg/dL (ref 8.4–10.4)
CO2: 27 mEq/L (ref 22–29)
Chloride: 105 mEq/L (ref 98–109)
Creatinine: 0.6 mg/dL (ref 0.6–1.1)
GLUCOSE: 72 mg/dL (ref 70–140)
POTASSIUM: 3.5 meq/L (ref 3.5–5.1)
SODIUM: 140 meq/L (ref 136–145)
TOTAL PROTEIN: 6.3 g/dL — AB (ref 6.4–8.3)

## 2017-07-06 LAB — MAGNESIUM: Magnesium: 2.1 mg/dl (ref 1.5–2.5)

## 2017-07-06 LAB — CEA (IN HOUSE-CHCC): CEA (CHCC-In House): 1.48 ng/mL (ref 0.00–5.00)

## 2017-07-06 MED ORDER — HEPARIN SOD (PORK) LOCK FLUSH 100 UNIT/ML IV SOLN
500.0000 [IU] | Freq: Once | INTRAVENOUS | Status: AC | PRN
  Administered 2017-07-06: 500 [IU]
  Filled 2017-07-06: qty 5

## 2017-07-06 MED ORDER — SODIUM CHLORIDE 0.9% FLUSH
10.0000 mL | INTRAVENOUS | Status: DC | PRN
  Administered 2017-07-06: 10 mL
  Filled 2017-07-06: qty 10

## 2017-07-06 MED ORDER — SODIUM CHLORIDE 0.9 % IV SOLN
Freq: Once | INTRAVENOUS | Status: AC
  Administered 2017-07-06: 10:00:00 via INTRAVENOUS

## 2017-07-06 MED ORDER — SODIUM CHLORIDE 0.9 % IJ SOLN
10.0000 mL | INTRAMUSCULAR | Status: AC | PRN
  Administered 2017-07-06: 10 mL
  Filled 2017-07-06: qty 10

## 2017-07-06 MED ORDER — INFLUENZA VAC SPLIT QUAD 0.5 ML IM SUSY
0.5000 mL | PREFILLED_SYRINGE | Freq: Once | INTRAMUSCULAR | Status: AC
  Administered 2017-07-06: 0.5 mL via INTRAMUSCULAR
  Filled 2017-07-06: qty 0.5

## 2017-07-06 MED ORDER — PANITUMUMAB CHEMO INJECTION 100 MG/5ML
5.8000 mg/kg | Freq: Once | INTRAVENOUS | Status: AC
  Administered 2017-07-06: 300 mg via INTRAVENOUS
  Filled 2017-07-06: qty 15

## 2017-07-06 NOTE — Progress Notes (Signed)
[facility] OFFICE PROGRESS NOTE   Diagnosis: Colon cancer  INTERVAL HISTORY:   Carolyn Barton returns as scheduled. She continues Xeloda/panitumumab. A CT of the abdomen/pelvis 06/19/2017 reveaed short segment wall thickening at the distal sigmoid colon. No evidence of a bowel obstruction. A complex cystic/solid lesion in the left adnexa is new. No ascites.  She reports no further episodes of abdominal pain. She feels well. Good appetite and energy level. No diarrhea.  Objective:  Vital signs in last 24 hours:  Blood pressure 129/79, pulse 75, temperature 98.4 F (36.9 C), temperature source Oral, resp. rate (!) 24, height _0  (1.651 m), weight 128 lb 3.2 oz (58.2 kg), SpO2 100 %.    HEENT: No thrush or ulcers Resp: Lungs clear bilaterally Cardio: Regular rate and rhythm GI: No hepatosplenomegaly, left lower quadrant colostomy, nontender, no mass Vascular: No leg edema  Skin: Hyperpigmentation of the extremities   Portacath/PICC-without erythema  Lab Results:  Lab Results  Component Value Date   WBC 5.1 07/06/2017   HGB 13.2 07/06/2017   HCT 40.3 07/06/2017   MCV 88.2 07/06/2017   PLT 359 07/06/2017   NEUTROABS 3.0 07/06/2017    CMP     Component Value Date/Time   NA 139 06/15/2017 0836   K 3.9 06/15/2017 0836   CL 99 (L) 04/10/2016 1206   CL 101 12/01/2012 0924   CO2 28 06/15/2017 0836   GLUCOSE 72 06/15/2017 0836   GLUCOSE 94 12/01/2012 0924   BUN 8.8 06/15/2017 0836   CREATININE 0.6 06/15/2017 0836   CALCIUM 9.1 06/15/2017 0836   PROT 6.4 06/15/2017 0836   ALBUMIN 3.2 (L) 06/15/2017 0836   AST 17 06/15/2017 0836   ALT 10 06/15/2017 0836   ALKPHOS 139 06/15/2017 0836   BILITOT 0.37 06/15/2017 0836   GFRNONAA >60 04/10/2016 1206   GFRAA >60 04/10/2016 1206    Lab Results  Component Value Date   CEA1 <1.00 06/15/2017     Imaging:  As per history of present illness, CT images from 06/19/2017 reviewed with Ms. 7 and her  daughter  Medications: I have reviewed the patient's current medications.  Assessment/Plan: 1. Metastatic colorectal cancer-status post an exploratory laparotomy 03/03/2016 revealing a proximal rectal mass, peritoneal carcinomatosis, and liver metastases  Biopsy of peritoneal nodules 03/03/2016 confirmed metastatic adenocarcinoma consistent with a colon primary  Foundation 1 testing-MSI-stable, tumor mutation burden-low, no BRAF NRAS or KRAS mutation  Cycle 1 FOLFIRI/PANITUMUMAB 04/14/2016  Cycle 2 FOLFIRI/panitumumab 04/28/2016  Cycle 3 FOLFIRI/panitumumab 05/12/2016  Cycle 4 FOLFIRI/panitumumab 05/27/2016  Cycle 5 FOLFIRI/panitumumab 06/09/2016  Restaging CT abdomen/pelvis 06/20/2016-no evidence of disease progression, decreased left hepatic lesion  Cycle 6 FOLFIRI/panitumumab 06/23/2016  Cycle 7 FOLFIRI/panitumumab 07/07/2016  Cycle 8 FOLFIRI/panitumumab 07/21/2016  Cycle 9 FOLFIRI/PANITUMUMAB 08/04/2016  Cycle 10 FOLFIRI/panitumumab 08/18/2016  Restaging CT 08/29/2016 with interval decrease in the size of the medial segment left liver lesion. Lesion identified previously in the rectosigmoid colon not evident on the current study.  5-FU/PANITUMUMAB 09/01/2016  Xeloda 7 days on/7 days off and PANITUMUMAB every 3 weeks 09/15/2016 (awaiting insurance approval for Xeloda 09/15/2016)  CT abdomen/pelvis 12/25/2016-unchanged 5 mm right hepatic lesion, resolution of medial segment left liver lesion, no new liver lesion. Residual soft tissue fullness in the sigmoid , persistent distal esophageal wall thickening  Continuationof PANITUMUMAB every 3 weeks and Xeloda 7 days on/7 days off  CT 06/19/2017-new cystic/solid lesion in the left adnexa  Xeloda/panitumumab continued  2.History of aBowel obstruction secondary to #1  3.  Chronic back pain  4. Essential thrombocytosis  5. Early obstruction of the left ureter noted at the time of surgery 03/03/2016-Dr.  Tannenbaum placed a left double-J stent 03/18/2016  6. Abdominal wall cellulitis 03/10/2016, blood cultures positive for coagulase negative staphylococcus-methicillin-resistant, status post treatment with vancomycin and Bactrim  7. Tachycardia-persistent following discharge from the hospital, likely related to anemia and deconditioning  CT chest 04/04/2016-negative for pulmonary embolism.  8. Port-A-Cath placement 04/10/2016, interventional radiology  9. Iron deficiency anemia. Feraheme 11/05/2015 , 11/12/2015,and 04/14/2016-persistent anemia and red cell microcytosis, oral iron resumed 11/17/2016-stable  10. Pain/tenderness left posterior iliac region-resolved  11. Hypokalemia-started on potassium replacement 06/09/2016  12. Skin rashand paronychiasecondary to [facility], she continues minocycline, moisturizers, and fluticasone  Disposition:  Carolyn Barton appears stable. We reviewed the 06/19/2017 CT today. I presented her case at the GI tumor conference 2 weeks ago. There is no clear explanation for the intermittent episodes of abdominal pain. The physicians present at the conference felt the pain was most likely related to herniated bowel at the ostomy site. The only evidence of disease progression is the left adnexal mass. Her pain has resolved.  We decided to continue Xeloda/panitumumab until there is clear evidence of disease progression.  Carolyn Barton will return for an office visit in 3 weeks. She will receive an influenza vaccine today.  25 minutes were spent with the patient today. The majority of the time was used for counseling and coordination of care.  Donneta Romberg, MD  07/06/2017  8:37 AM

## 2017-07-06 NOTE — Telephone Encounter (Signed)
07/02/17 prescription refill. Prescription is scanned into media

## 2017-07-06 NOTE — Telephone Encounter (Signed)
Gave avs and calendar for November  °

## 2017-07-06 NOTE — Patient Instructions (Signed)
[facility] Discharge Instructions for Patients Receiving Chemotherapy  Today you received the following chemotherapy agents: Panitumumab   To help prevent nausea and vomiting after your treatment, we encourage you to take your nausea medication as directed.    If you develop nausea and vomiting that is not controlled by your nausea medication, call the clinic.   BELOW ARE SYMPTOMS THAT SHOULD BE REPORTED IMMEDIATELY:  *FEVER GREATER THAN 100.5 F  *CHILLS WITH OR WITHOUT FEVER  NAUSEA AND VOMITING THAT IS NOT CONTROLLED WITH YOUR NAUSEA MEDICATION  *UNUSUAL SHORTNESS OF BREATH  *UNUSUAL BRUISING OR BLEEDING  TENDERNESS IN MOUTH AND THROAT WITH OR WITHOUT PRESENCE OF ULCERS  *URINARY PROBLEMS  *BOWEL PROBLEMS  UNUSUAL RASH Items with * indicate a potential emergency and should be followed up as soon as possible.  Feel free to call the clinic you have any questions or concerns. The clinic phone number is [phone].  Please show the [place] at check-in to the Emergency Department and triage nurse.

## 2017-07-10 ENCOUNTER — Telehealth: Payer: Self-pay | Admitting: Oncology

## 2017-07-10 NOTE — Telephone Encounter (Signed)
07/10/17 prescription refill scanned in media °

## 2017-07-25 ENCOUNTER — Other Ambulatory Visit: Payer: Self-pay | Admitting: Oncology

## 2017-07-27 ENCOUNTER — Ambulatory Visit (HOSPITAL_BASED_OUTPATIENT_CLINIC_OR_DEPARTMENT_OTHER): Payer: BLUE CROSS/BLUE SHIELD | Admitting: Oncology

## 2017-07-27 ENCOUNTER — Ambulatory Visit: Payer: BLUE CROSS/BLUE SHIELD

## 2017-07-27 ENCOUNTER — Other Ambulatory Visit (HOSPITAL_BASED_OUTPATIENT_CLINIC_OR_DEPARTMENT_OTHER): Payer: BLUE CROSS/BLUE SHIELD

## 2017-07-27 ENCOUNTER — Ambulatory Visit (HOSPITAL_BASED_OUTPATIENT_CLINIC_OR_DEPARTMENT_OTHER): Payer: BLUE CROSS/BLUE SHIELD

## 2017-07-27 ENCOUNTER — Telehealth: Payer: Self-pay | Admitting: Oncology

## 2017-07-27 VITALS — BP 138/84 | HR 71 | Temp 98.2°F | Resp 19 | Ht 65.0 in | Wt 127.6 lb

## 2017-07-27 DIAGNOSIS — C786 Secondary malignant neoplasm of retroperitoneum and peritoneum: Secondary | ICD-10-CM

## 2017-07-27 DIAGNOSIS — C20 Malignant neoplasm of rectum: Secondary | ICD-10-CM

## 2017-07-27 DIAGNOSIS — Z95828 Presence of other vascular implants and grafts: Secondary | ICD-10-CM

## 2017-07-27 DIAGNOSIS — C19 Malignant neoplasm of rectosigmoid junction: Secondary | ICD-10-CM

## 2017-07-27 DIAGNOSIS — D509 Iron deficiency anemia, unspecified: Secondary | ICD-10-CM

## 2017-07-27 DIAGNOSIS — C787 Secondary malignant neoplasm of liver and intrahepatic bile duct: Secondary | ICD-10-CM

## 2017-07-27 DIAGNOSIS — R109 Unspecified abdominal pain: Secondary | ICD-10-CM

## 2017-07-27 DIAGNOSIS — Z5112 Encounter for antineoplastic immunotherapy: Secondary | ICD-10-CM

## 2017-07-27 DIAGNOSIS — M545 Low back pain: Secondary | ICD-10-CM

## 2017-07-27 LAB — CBC WITH DIFFERENTIAL/PLATELET
BASO%: 0.4 % (ref 0.0–2.0)
Basophils Absolute: 0 10*3/uL (ref 0.0–0.1)
EOS ABS: 0.3 10*3/uL (ref 0.0–0.5)
EOS%: 4.7 % (ref 0.0–7.0)
HCT: 42.9 % (ref 34.8–46.6)
HGB: 13.7 g/dL (ref 11.6–15.9)
LYMPH%: 19.9 % (ref 14.0–49.7)
MCH: 29.1 pg (ref 25.1–34.0)
MCHC: 31.9 g/dL (ref 31.5–36.0)
MCV: 91.1 fL (ref 79.5–101.0)
MONO#: 0.9 10*3/uL (ref 0.1–0.9)
MONO%: 15.5 % — AB (ref 0.0–14.0)
NEUT%: 59.5 % (ref 38.4–76.8)
NEUTROS ABS: 3.3 10*3/uL (ref 1.5–6.5)
Platelets: 311 10*3/uL (ref 145–400)
RBC: 4.71 10*6/uL (ref 3.70–5.45)
RDW: 17.1 % — ABNORMAL HIGH (ref 11.2–14.5)
WBC: 5.5 10*3/uL (ref 3.9–10.3)
lymph#: 1.1 10*3/uL (ref 0.9–3.3)

## 2017-07-27 LAB — COMPREHENSIVE METABOLIC PANEL
ALT: 12 U/L (ref 0–55)
AST: 21 U/L (ref 5–34)
Albumin: 3.2 g/dL — ABNORMAL LOW (ref 3.5–5.0)
Alkaline Phosphatase: 128 U/L (ref 40–150)
Anion Gap: 6 mEq/L (ref 3–11)
BILIRUBIN TOTAL: 0.41 mg/dL (ref 0.20–1.20)
BUN: 7.5 mg/dL (ref 7.0–26.0)
CO2: 28 meq/L (ref 22–29)
Calcium: 9 mg/dL (ref 8.4–10.4)
Chloride: 104 mEq/L (ref 98–109)
Creatinine: 0.6 mg/dL (ref 0.6–1.1)
GLUCOSE: 79 mg/dL (ref 70–140)
POTASSIUM: 3.7 meq/L (ref 3.5–5.1)
SODIUM: 137 meq/L (ref 136–145)
TOTAL PROTEIN: 6.4 g/dL (ref 6.4–8.3)

## 2017-07-27 LAB — CEA (IN HOUSE-CHCC): CEA (CHCC-In House): 1.29 ng/mL (ref 0.00–5.00)

## 2017-07-27 LAB — MAGNESIUM: Magnesium: 2.1 mg/dl (ref 1.5–2.5)

## 2017-07-27 MED ORDER — HEPARIN SOD (PORK) LOCK FLUSH 100 UNIT/ML IV SOLN
500.0000 [IU] | Freq: Once | INTRAVENOUS | Status: AC | PRN
  Administered 2017-07-27: 500 [IU]
  Filled 2017-07-27: qty 5

## 2017-07-27 MED ORDER — SODIUM CHLORIDE 0.9 % IV SOLN
Freq: Once | INTRAVENOUS | Status: AC
  Administered 2017-07-27: 10:00:00 via INTRAVENOUS

## 2017-07-27 MED ORDER — SODIUM CHLORIDE 0.9 % IJ SOLN
10.0000 mL | INTRAMUSCULAR | Status: DC | PRN
  Administered 2017-07-27: 10 mL via INTRAVENOUS
  Filled 2017-07-27: qty 10

## 2017-07-27 MED ORDER — SODIUM CHLORIDE 0.9% FLUSH
10.0000 mL | INTRAVENOUS | Status: DC | PRN
  Administered 2017-07-27: 10 mL
  Filled 2017-07-27: qty 10

## 2017-07-27 MED ORDER — SODIUM CHLORIDE 0.9 % IV SOLN
5.6000 mg/kg | Freq: Once | INTRAVENOUS | Status: AC
  Administered 2017-07-27: 300 mg via INTRAVENOUS
  Filled 2017-07-27: qty 15

## 2017-07-27 NOTE — Telephone Encounter (Signed)
Gave relative avs report and appointments for November and December.  °

## 2017-07-27 NOTE — Progress Notes (Signed)
[facility] OFFICE PROGRESS NOTE   Diagnosis: Colon cancer  INTERVAL HISTORY:   Carolyn Barton returns as scheduled. She continues Xeloda and panitumumab. No mouth sores or hand/foot pain. The scalp lesion has healed. She again has intermittent episodes of abdominal pain. The pain last up to hours and occurs approximately every other day. The pain is relieved when she has an explosive bowel movement.  Objective:  Vital signs in last 24 hours:  Blood pressure 138/84, pulse 71, temperature 98.2 F (36.8 C), temperature source Oral, resp. rate 19, height '5\' 5"'$  (1.651 m), weight 127 lb 9.6 oz (57.9 kg), SpO2 100 %.    HEENT: No thrush or ulcers Resp: Lungs clear bilaterally Cardio: Regular rate and rhythm GI: No hepatomegaly, nondistended, nontender, left lower quadrant colostomy, no mass Vascular: No leg edema Skin: Hyperpigmentation over the legs, healed lesion at the parietal scalp   Portacath/PICC-without erythema  Lab Results:  Lab Results  Component Value Date   WBC 5.5 07/27/2017   HGB 13.7 07/27/2017   HCT 42.9 07/27/2017   MCV 91.1 07/27/2017   PLT 311 07/27/2017   NEUTROABS 3.3 07/27/2017    CMP     Component Value Date/Time   NA 137 07/27/2017 0825   K 3.7 07/27/2017 0825   CL 99 (L) 04/10/2016 1206   CL 101 12/01/2012 0924   CO2 28 07/27/2017 0825   GLUCOSE 79 07/27/2017 0825   GLUCOSE 94 12/01/2012 0924   BUN 7.5 07/27/2017 0825   CREATININE 0.6 07/27/2017 0825   CALCIUM 9.0 07/27/2017 0825   PROT 6.4 07/27/2017 0825   ALBUMIN 3.2 (L) 07/27/2017 0825   AST 21 07/27/2017 0825   ALT 12 07/27/2017 0825   ALKPHOS 128 07/27/2017 0825   BILITOT 0.41 07/27/2017 0825   GFRNONAA >60 04/10/2016 1206   GFRAA >60 04/10/2016 1206    Lab Results  Component Value Date   CEA1 1.48 07/06/2017    Medications: I have reviewed the patient's current medications.  Assessment/Plan: 1. Metastatic colorectal cancer-status post an exploratory laparotomy  03/03/2016 revealing a proximal rectal mass, peritoneal carcinomatosis, and liver metastases  Biopsy of peritoneal nodules 03/03/2016 confirmed metastatic adenocarcinoma consistent with a colon primary  Foundation 1 testing-MSI-stable, tumor mutation burden-low, no BRAF NRAS or KRAS mutation  Cycle 1 FOLFIRI/PANITUMUMAB 04/14/2016  Cycle 2 FOLFIRI/panitumumab 04/28/2016  Cycle 3 FOLFIRI/panitumumab 05/12/2016  Cycle 4 FOLFIRI/panitumumab 05/27/2016  Cycle 5 FOLFIRI/panitumumab 06/09/2016  Restaging CT abdomen/pelvis 06/20/2016-no evidence of disease progression, decreased left hepatic lesion  Cycle 6 FOLFIRI/panitumumab 06/23/2016  Cycle 7 FOLFIRI/panitumumab 07/07/2016  Cycle 8 FOLFIRI/panitumumab 07/21/2016  Cycle 9 FOLFIRI/PANITUMUMAB 08/04/2016  Cycle 10 FOLFIRI/panitumumab 08/18/2016  Restaging CT 08/29/2016 with interval decrease in the size of the medial segment left liver lesion. Lesion identified previously in the rectosigmoid colon not evident on the current study.  5-FU/PANITUMUMAB 09/01/2016  Xeloda 7 days on/7 days off and PANITUMUMAB every 3 weeks 09/15/2016 (awaiting insurance approval for Xeloda 09/15/2016)  CT abdomen/pelvis 12/25/2016-unchanged 5 mm right hepatic lesion, resolution of medial segment left liver lesion, no new liver lesion. Residual soft tissue fullness in the sigmoid , persistent distal esophageal wall thickening  Continuationof PANITUMUMAB every 3 weeks and Xeloda 7 days on/7 days off  CT 06/19/2017-new cystic/solid lesion in the left adnexa  Xeloda/panitumumab continued  2.History of aBowel obstruction secondary to #1  3. Chronic back pain  4. Essential thrombocytosis  5. Early obstruction of the left ureter noted at the time of surgery 03/03/2016-Dr. Tannenbaum placed a  left double-J stent 03/18/2016  6. Abdominal wall cellulitis 03/10/2016, blood cultures positive for coagulase negative  staphylococcus-methicillin-resistant, status post treatment with vancomycin and Bactrim  7. Tachycardia-persistent following discharge from the hospital, likely related to anemia and deconditioning  CT chest 04/04/2016-negative for pulmonary embolism.  8. Port-A-Cath placement 04/10/2016, interventional radiology  9. Iron deficiency anemia. Feraheme 11/05/2015 , 11/12/2015,and 04/14/2016-persistent anemia and red cell microcytosis, oral iron resumed 11/17/2016-stable  10. Pain/tenderness left posterior iliac region-resolved  11. Hypokalemia-started on potassium replacement 06/09/2016  12. Skin rashand paronychiasecondary to [facility], she continues minocycline, moisturizers, and fluticasone   Disposition:  Carolyn Barton continues to tolerate the Xeloda/panitumumab well. There is no clinical evidence of progressive colorectal cancer.  She has intermittent episodes of severe abdominal pain relieved after a bowel movement. This occurs approximately every other day. I suspect she is having intermittent obstruction. Her case was presented at the GI tumor conference last month and there was evidence of bowel herniation at the ostomy site. I will contact Dr. Brantley Stage and request an appointment for as soon as possible. Her pain could be related to adhesions.  She will return for an office visit and panitumumab in 2 weeks.  15 minutes were spent with the patient today. The majority of the time was used for counseling and coordination of care.  Donneta Romberg, MD  07/27/2017  9:54 AM

## 2017-07-27 NOTE — Patient Instructions (Signed)
[facility] Discharge Instructions for Patients Receiving Chemotherapy  Today you received the following chemotherapy agents: Panitumumab   To help prevent nausea and vomiting after your treatment, we encourage you to take your nausea medication as directed.    If you develop nausea and vomiting that is not controlled by your nausea medication, call the clinic.   BELOW ARE SYMPTOMS THAT SHOULD BE REPORTED IMMEDIATELY:  *FEVER GREATER THAN 100.5 F  *CHILLS WITH OR WITHOUT FEVER  NAUSEA AND VOMITING THAT IS NOT CONTROLLED WITH YOUR NAUSEA MEDICATION  *UNUSUAL SHORTNESS OF BREATH  *UNUSUAL BRUISING OR BLEEDING  TENDERNESS IN MOUTH AND THROAT WITH OR WITHOUT PRESENCE OF ULCERS  *URINARY PROBLEMS  *BOWEL PROBLEMS  UNUSUAL RASH Items with * indicate a potential emergency and should be followed up as soon as possible.  Feel free to call the clinic you have any questions or concerns. The clinic phone number is [phone].  Please show the [place] at check-in to the Emergency Department and triage nurse.

## 2017-07-28 ENCOUNTER — Telehealth: Payer: Self-pay | Admitting: *Deleted

## 2017-07-28 NOTE — Telephone Encounter (Signed)
Message from pt reporting she called for an appt with her surgeon. He doesn't think there is a hernia-- it would've shown up on CT. She reports Dr. Brantley Stage recommended she have another scan or see GI. Pt says he will see her in the office next week if she wants. Recommended she keep the appt next week. Informed pt MD is out of office today, will review with Dr. Benay Spice on 10/31. She voiced understanding.

## 2017-07-29 NOTE — Telephone Encounter (Signed)
Case was discussed in conference. Pt had large amounts of stool above and below colostomy on CT. Dr. Benay Spice recommends Miralax BID and fleets enema per rectum to see if this relieves the pain. May not need to see surgeon if this helps. Left message for pt to call office.

## 2017-07-29 NOTE — Telephone Encounter (Signed)
Pt returned call, Miralax and Fleets enema instructions reviewed. Teach back complete. She voiced appreciation for call. She will let us know if this does not help the pain.

## 2017-08-09 ENCOUNTER — Other Ambulatory Visit: Payer: Self-pay | Admitting: Oncology

## 2017-08-10 ENCOUNTER — Ambulatory Visit (HOSPITAL_BASED_OUTPATIENT_CLINIC_OR_DEPARTMENT_OTHER): Payer: BLUE CROSS/BLUE SHIELD | Admitting: Nurse Practitioner

## 2017-08-10 ENCOUNTER — Ambulatory Visit (HOSPITAL_BASED_OUTPATIENT_CLINIC_OR_DEPARTMENT_OTHER): Payer: BLUE CROSS/BLUE SHIELD

## 2017-08-10 ENCOUNTER — Other Ambulatory Visit (HOSPITAL_BASED_OUTPATIENT_CLINIC_OR_DEPARTMENT_OTHER): Payer: BLUE CROSS/BLUE SHIELD

## 2017-08-10 ENCOUNTER — Encounter: Payer: Self-pay | Admitting: Nurse Practitioner

## 2017-08-10 ENCOUNTER — Telehealth: Payer: Self-pay | Admitting: Oncology

## 2017-08-10 ENCOUNTER — Ambulatory Visit: Payer: BLUE CROSS/BLUE SHIELD

## 2017-08-10 VITALS — BP 146/80 | HR 76 | Temp 98.5°F | Resp 18 | Ht 65.0 in | Wt 126.4 lb

## 2017-08-10 DIAGNOSIS — C786 Secondary malignant neoplasm of retroperitoneum and peritoneum: Secondary | ICD-10-CM

## 2017-08-10 DIAGNOSIS — C20 Malignant neoplasm of rectum: Secondary | ICD-10-CM | POA: Diagnosis not present

## 2017-08-10 DIAGNOSIS — C19 Malignant neoplasm of rectosigmoid junction: Secondary | ICD-10-CM

## 2017-08-10 DIAGNOSIS — M549 Dorsalgia, unspecified: Secondary | ICD-10-CM | POA: Diagnosis not present

## 2017-08-10 DIAGNOSIS — R109 Unspecified abdominal pain: Secondary | ICD-10-CM | POA: Diagnosis not present

## 2017-08-10 DIAGNOSIS — Z5112 Encounter for antineoplastic immunotherapy: Secondary | ICD-10-CM

## 2017-08-10 DIAGNOSIS — C787 Secondary malignant neoplasm of liver and intrahepatic bile duct: Secondary | ICD-10-CM

## 2017-08-10 LAB — CBC WITH DIFFERENTIAL/PLATELET
BASO%: 1.1 % (ref 0.0–2.0)
BASOS ABS: 0 10*3/uL (ref 0.0–0.1)
EOS ABS: 0.3 10*3/uL (ref 0.0–0.5)
EOS%: 6 % (ref 0.0–7.0)
HCT: 43.3 % (ref 34.8–46.6)
HGB: 14 g/dL (ref 11.6–15.9)
LYMPH%: 22.3 % (ref 14.0–49.7)
MCH: 29 pg (ref 25.1–34.0)
MCHC: 32.3 g/dL (ref 31.5–36.0)
MCV: 89.8 fL (ref 79.5–101.0)
MONO#: 0.5 10*3/uL (ref 0.1–0.9)
MONO%: 10.6 % (ref 0.0–14.0)
NEUT%: 60 % (ref 38.4–76.8)
NEUTROS ABS: 2.8 10*3/uL (ref 1.5–6.5)
Platelets: 358 10*3/uL (ref 145–400)
RBC: 4.83 10*6/uL (ref 3.70–5.45)
RDW: 17.9 % — ABNORMAL HIGH (ref 11.2–14.5)
WBC: 4.7 10*3/uL (ref 3.9–10.3)
lymph#: 1 10*3/uL (ref 0.9–3.3)

## 2017-08-10 LAB — MAGNESIUM: Magnesium: 2.1 mg/dl (ref 1.5–2.5)

## 2017-08-10 LAB — COMPREHENSIVE METABOLIC PANEL
ALT: 13 U/L (ref 0–55)
AST: 18 U/L (ref 5–34)
Albumin: 3.3 g/dL — ABNORMAL LOW (ref 3.5–5.0)
Alkaline Phosphatase: 118 U/L (ref 40–150)
Anion Gap: 5 mEq/L (ref 3–11)
BUN: 8.4 mg/dL (ref 7.0–26.0)
CALCIUM: 9.1 mg/dL (ref 8.4–10.4)
CHLORIDE: 104 meq/L (ref 98–109)
CO2: 29 mEq/L (ref 22–29)
CREATININE: 0.6 mg/dL (ref 0.6–1.1)
EGFR: >60 mL/min/{1.73_m2} (ref >60)
Glucose: 72 mg/dl (ref 70–140)
Potassium: 3.6 mEq/L (ref 3.5–5.1)
Sodium: 139 mEq/L (ref 136–145)
Total Bilirubin: 0.42 mg/dL (ref 0.20–1.20)
Total Protein: 6.7 g/dL (ref 6.4–8.3)

## 2017-08-10 LAB — CEA (IN HOUSE-CHCC): CEA (CHCC-IN HOUSE): 1.32 ng/mL (ref 0.00–5.00)

## 2017-08-10 MED ORDER — SODIUM CHLORIDE 0.9 % IV SOLN
Freq: Once | INTRAVENOUS | Status: AC
  Administered 2017-08-10: 10:00:00 via INTRAVENOUS

## 2017-08-10 MED ORDER — SODIUM CHLORIDE 0.9% FLUSH
10.0000 mL | INTRAVENOUS | Status: DC | PRN
  Administered 2017-08-10: 10 mL
  Filled 2017-08-10: qty 10

## 2017-08-10 MED ORDER — HEPARIN SOD (PORK) LOCK FLUSH 100 UNIT/ML IV SOLN
500.0000 [IU] | Freq: Once | INTRAVENOUS | Status: AC | PRN
  Administered 2017-08-10: 500 [IU]
  Filled 2017-08-10: qty 5

## 2017-08-10 MED ORDER — SODIUM CHLORIDE 0.9 % IV SOLN
300.0000 mg | Freq: Once | INTRAVENOUS | Status: AC
  Administered 2017-08-10: 300 mg via INTRAVENOUS
  Filled 2017-08-10: qty 15

## 2017-08-10 NOTE — Progress Notes (Signed)
[facility] OFFICE PROGRESS NOTE   Diagnosis:  Colon cancer  INTERVAL HISTORY:   Carolyn Barton returns as scheduled.  She continues Xeloda and Panitumumab.  She has had a single episode of mild abdominal pain relieved with a bowel movement since beginning the laxative regimen.  She has occasional mild nausea.  No mouth sores.  No hand or foot pain or redness.  No significant rash.  Objective:  Vital signs in last 24 hours:  Blood pressure (!) 146/80, pulse 76, temperature 98.5 F (36.9 C), temperature source Oral, resp. rate 18, height '5\' 5"'$  (1.651 m), weight 126 lb 6.4 oz (57.3 kg), SpO2 100 %.    HEENT: No thrush or ulcers. Resp: Lungs clear bilaterally. Cardio: Regular rate and rhythm. GI: Abdomen soft and nontender.  No hepatomegaly.  Left lower quadrant colostomy. Vascular: No leg edema. Skin: Palms without erythema. Port-A-Cath without erythema.  Lab Results:  Lab Results  Component Value Date   WBC 4.7 08/10/2017   HGB 14.0 08/10/2017   HCT 43.3 08/10/2017   MCV 89.8 08/10/2017   PLT 358 08/10/2017   NEUTROABS 2.8 08/10/2017    Imaging:  No results found.  Medications: I have reviewed the patient's current medications.  Assessment/Plan: 1. Metastatic colorectal cancer-status post an exploratory laparotomy 03/03/2016 revealing a proximal rectal mass, peritoneal carcinomatosis, and liver metastases  Biopsy of peritoneal nodules 03/03/2016 confirmed metastatic adenocarcinoma consistent with a colon primary  Foundation 1 testing-MSI-stable, tumor mutation burden-low, no BRAF NRAS or KRAS mutation  Cycle 1 FOLFIRI/PANITUMUMAB 04/14/2016  Cycle 2 FOLFIRI/panitumumab 04/28/2016  Cycle 3 FOLFIRI/panitumumab 05/12/2016  Cycle 4 FOLFIRI/panitumumab 05/27/2016  Cycle 5 FOLFIRI/panitumumab 06/09/2016  Restaging CT abdomen/pelvis 06/20/2016-no evidence of disease progression, decreased left hepatic lesion  Cycle 6 FOLFIRI/panitumumab  06/23/2016  Cycle 7 FOLFIRI/panitumumab 07/07/2016  Cycle 8 FOLFIRI/panitumumab 07/21/2016  Cycle 9 FOLFIRI/PANITUMUMAB 08/04/2016  Cycle 10 FOLFIRI/panitumumab 08/18/2016  Restaging CT 08/29/2016 with interval decrease in the size of the medial segment left liver lesion. Lesion identified previously in the rectosigmoid colon not evident on the current study.  5-FU/PANITUMUMAB 09/01/2016  Xeloda 7 days on/7 days off and PANITUMUMAB every 3 weeks 09/15/2016 (awaiting insurance approval for Xeloda 09/15/2016)  CT abdomen/pelvis 12/25/2016-unchanged 5 mm right hepatic lesion, resolution of medial segment left liver lesion, no new liver lesion. Residual soft tissue fullness in the sigmoid , persistent distal esophageal wall thickening  Continuationof PANITUMUMAB every 3 weeks and Xeloda 7 days on/7 days off  CT 06/19/2017-new cystic/solid lesion in the left adnexa  Xeloda/panitumumab continued  2.History of aBowel obstruction secondary to #1  3. Chronic back pain  4. Essential thrombocytosis  5. Early obstruction of the left ureter noted at the time of surgery 03/03/2016-Dr. Tannenbaum placed a left double-J stent 03/18/2016  6. Abdominal wall cellulitis 03/10/2016, blood cultures positive for coagulase negative staphylococcus-methicillin-resistant, status post treatment with vancomycin and Bactrim  7. Tachycardia-persistent following discharge from the hospital, likely related to anemia and deconditioning  CT chest 04/04/2016-negative for pulmonary embolism.  8. Port-A-Cath placement 04/10/2016, interventional radiology  9. Iron deficiency anemia. Feraheme 11/05/2015 , 11/12/2015,and 04/14/2016-persistent anemia and red cell microcytosis, oral iron resumed 11/17/2016-stable  10. Pain/tenderness left posterior iliac region-resolved  11. Hypokalemia-started on potassium replacement 06/09/2016  12. Skin rashand paronychiasecondary to  [facility], she continues minocycline, moisturizers, and fluticasone   [name] appears stable.  There is no clinical evidence of disease progression.  The plan is to continue Xeloda/Panitumumab.  The intermittent abdominal pain is significantly better since  beginning a laxative regimen.  She will continue the same.  She will return for follow-up and Panitumumab in 3 weeks.  She will contact the office in the interim with any problems.    Ned Card ANP/GNP-BC   08/10/2017  9:49 AM

## 2017-08-10 NOTE — Telephone Encounter (Signed)
Gave avs and calendar for December  °

## 2017-08-10 NOTE — Patient Instructions (Signed)
Implanted Port Home Guide An implanted port is a type of central line that is placed under the skin. Central lines are used to provide IV access when treatment or nutrition needs to be given through a person's veins. Implanted ports are used for long-term IV access. An implanted port may be placed because:  You need IV medicine that would be irritating to the small veins in your hands or arms.  You need long-term IV medicines, such as antibiotics.  You need IV nutrition for a long period.  You need frequent blood draws for lab tests.  You need dialysis.  Implanted ports are usually placed in the chest area, but they can also be placed in the upper arm, the abdomen, or the leg. An implanted port has two main parts:  Reservoir. The reservoir is round and will appear as a small, raised area under your skin. The reservoir is the part where a needle is inserted to give medicines or draw blood.  Catheter. The catheter is a thin, flexible tube that extends from the reservoir. The catheter is placed into a large vein. Medicine that is inserted into the reservoir goes into the catheter and then into the vein.  How will I care for my incision site? Do not get the incision site wet. Bathe or shower as directed by your health care provider. How is my port accessed? Special steps must be taken to access the port:  Before the port is accessed, a numbing cream can be placed on the skin. This helps numb the skin over the port site.  Your health care provider uses a sterile technique to access the port. ? Your health care provider must put on a mask and sterile gloves. ? The skin over your port is cleaned carefully with an antiseptic and allowed to dry. ? The port is gently pinched between sterile gloves, and a needle is inserted into the port.  Only "non-coring" port needles should be used to access the port. Once the port is accessed, a blood return should be checked. This helps ensure that the port  is in the vein and is not clogged.  If your port needs to remain accessed for a constant infusion, a clear (transparent) bandage will be placed over the needle site. The bandage and needle will need to be changed every week, or as directed by your health care provider.  Keep the bandage covering the needle clean and dry. Do not get it wet. Follow your health care provider's instructions on how to take a shower or bath while the port is accessed.  If your port does not need to stay accessed, no bandage is needed over the port.  What is flushing? Flushing helps keep the port from getting clogged. Follow your health care provider's instructions on how and when to flush the port. Ports are usually flushed with saline solution or a medicine called heparin. The need for flushing will depend on how the port is used.  If the port is used for intermittent medicines or blood draws, the port will need to be flushed: ? After medicines have been given. ? After blood has been drawn. ? As part of routine maintenance.  If a constant infusion is running, the port may not need to be flushed.  How long will my port stay implanted? The port can stay in for as long as your health care provider thinks it is needed. When it is time for the port to come out, surgery will be   done to remove it. The procedure is similar to the one performed when the port was put in. When should I seek immediate medical care? When you have an implanted port, you should seek immediate medical care if:  You notice a bad smell coming from the incision site.  You have swelling, redness, or drainage at the incision site.  You have more swelling or pain at the port site or the surrounding area.  You have a fever that is not controlled with medicine.  This information is not intended to replace advice given to you by your health care provider. Make sure you discuss any questions you have with your health care provider. Document  Released: 09/15/2005 Document Revised: 02/21/2016 Document Reviewed: 05/23/2013 Elsevier Interactive Patient Education  2017 [employer]  

## 2017-08-10 NOTE — Patient Instructions (Signed)
[facility] Discharge Instructions for Patients Receiving Chemotherapy  Today you received the following chemotherapy agents: Panitumumab (VECTIBIX)  To help prevent nausea and vomiting after your treatment, we encourage you to take your nausea medication as directed.    If you develop nausea and vomiting that is not controlled by your nausea medication, call the clinic.   BELOW ARE SYMPTOMS THAT SHOULD BE REPORTED IMMEDIATELY:  *FEVER GREATER THAN 100.5 F  *CHILLS WITH OR WITHOUT FEVER  NAUSEA AND VOMITING THAT IS NOT CONTROLLED WITH YOUR NAUSEA MEDICATION  *UNUSUAL SHORTNESS OF BREATH  *UNUSUAL BRUISING OR BLEEDING  TENDERNESS IN MOUTH AND THROAT WITH OR WITHOUT PRESENCE OF ULCERS  *URINARY PROBLEMS  *BOWEL PROBLEMS  UNUSUAL RASH Items with * indicate a potential emergency and should be followed up as soon as possible.  Feel free to call the clinic you have any questions or concerns. The clinic phone number is [phone].  Please show the [facility] at check-in to the Emergency Department and triage nurse.

## 2017-08-30 ENCOUNTER — Other Ambulatory Visit: Payer: Self-pay | Admitting: Oncology

## 2017-08-31 ENCOUNTER — Telehealth: Payer: Self-pay | Admitting: Oncology

## 2017-08-31 ENCOUNTER — Ambulatory Visit (HOSPITAL_BASED_OUTPATIENT_CLINIC_OR_DEPARTMENT_OTHER): Payer: BLUE CROSS/BLUE SHIELD | Admitting: Oncology

## 2017-08-31 ENCOUNTER — Ambulatory Visit: Payer: BLUE CROSS/BLUE SHIELD

## 2017-08-31 ENCOUNTER — Ambulatory Visit (HOSPITAL_BASED_OUTPATIENT_CLINIC_OR_DEPARTMENT_OTHER): Payer: BLUE CROSS/BLUE SHIELD

## 2017-08-31 ENCOUNTER — Other Ambulatory Visit (HOSPITAL_BASED_OUTPATIENT_CLINIC_OR_DEPARTMENT_OTHER): Payer: BLUE CROSS/BLUE SHIELD

## 2017-08-31 VITALS — BP 144/93 | HR 76 | Temp 98.5°F | Resp 16 | Ht 65.0 in | Wt 127.5 lb

## 2017-08-31 DIAGNOSIS — C20 Malignant neoplasm of rectum: Secondary | ICD-10-CM

## 2017-08-31 DIAGNOSIS — C787 Secondary malignant neoplasm of liver and intrahepatic bile duct: Secondary | ICD-10-CM

## 2017-08-31 DIAGNOSIS — C786 Secondary malignant neoplasm of retroperitoneum and peritoneum: Secondary | ICD-10-CM | POA: Diagnosis not present

## 2017-08-31 DIAGNOSIS — Z5112 Encounter for antineoplastic immunotherapy: Secondary | ICD-10-CM | POA: Diagnosis not present

## 2017-08-31 DIAGNOSIS — D509 Iron deficiency anemia, unspecified: Secondary | ICD-10-CM

## 2017-08-31 DIAGNOSIS — C19 Malignant neoplasm of rectosigmoid junction: Secondary | ICD-10-CM

## 2017-08-31 DIAGNOSIS — M549 Dorsalgia, unspecified: Secondary | ICD-10-CM

## 2017-08-31 DIAGNOSIS — L98499 Non-pressure chronic ulcer of skin of other sites with unspecified severity: Secondary | ICD-10-CM | POA: Diagnosis not present

## 2017-08-31 DIAGNOSIS — Z95828 Presence of other vascular implants and grafts: Secondary | ICD-10-CM

## 2017-08-31 LAB — COMPREHENSIVE METABOLIC PANEL
ALT: 15 U/L (ref 0–55)
AST: 21 U/L (ref 5–34)
Albumin: 3.3 g/dL — ABNORMAL LOW (ref 3.5–5.0)
Alkaline Phosphatase: 118 U/L (ref 40–150)
Anion Gap: 6 mEq/L (ref 3–11)
BUN: 6.5 mg/dL — AB (ref 7.0–26.0)
CO2: 29 meq/L (ref 22–29)
Calcium: 8.8 mg/dL (ref 8.4–10.4)
Chloride: 103 mEq/L (ref 98–109)
Creatinine: 0.6 mg/dL (ref 0.6–1.1)
GLUCOSE: 70 mg/dL (ref 70–140)
POTASSIUM: 3.8 meq/L (ref 3.5–5.1)
SODIUM: 137 meq/L (ref 136–145)
Total Bilirubin: 0.38 mg/dL (ref 0.20–1.20)
Total Protein: 6.3 g/dL — ABNORMAL LOW (ref 6.4–8.3)

## 2017-08-31 LAB — CBC WITH DIFFERENTIAL/PLATELET
BASO%: 1.7 % (ref 0.0–2.0)
BASOS ABS: 0.1 10*3/uL (ref 0.0–0.1)
EOS ABS: 0.3 10*3/uL (ref 0.0–0.5)
EOS%: 5.9 % (ref 0.0–7.0)
HEMATOCRIT: 41.3 % (ref 34.8–46.6)
HGB: 13.6 g/dL (ref 11.6–15.9)
LYMPH#: 0.9 10*3/uL (ref 0.9–3.3)
LYMPH%: 18.6 % (ref 14.0–49.7)
MCH: 29.5 pg (ref 25.1–34.0)
MCHC: 32.9 g/dL (ref 31.5–36.0)
MCV: 89.6 fL (ref 79.5–101.0)
MONO#: 0.5 10*3/uL (ref 0.1–0.9)
MONO%: 10.6 % (ref 0.0–14.0)
NEUT#: 2.9 10*3/uL (ref 1.5–6.5)
NEUT%: 63.2 % (ref 38.4–76.8)
PLATELETS: 365 10*3/uL (ref 145–400)
RBC: 4.61 10*6/uL (ref 3.70–5.45)
RDW: 17.3 % — ABNORMAL HIGH (ref 11.2–14.5)
WBC: 4.6 10*3/uL (ref 3.9–10.3)

## 2017-08-31 LAB — CEA (IN HOUSE-CHCC): CEA (CHCC-In House): 1.51 ng/mL (ref 0.00–5.00)

## 2017-08-31 LAB — MAGNESIUM: MAGNESIUM: 2.2 mg/dL (ref 1.5–2.5)

## 2017-08-31 MED ORDER — SODIUM CHLORIDE 0.9 % IV SOLN
300.0000 mg | Freq: Once | INTRAVENOUS | Status: AC
  Administered 2017-08-31: 300 mg via INTRAVENOUS
  Filled 2017-08-31: qty 15

## 2017-08-31 MED ORDER — SODIUM CHLORIDE 0.9 % IV SOLN
Freq: Once | INTRAVENOUS | Status: AC
  Administered 2017-08-31: 12:00:00 via INTRAVENOUS

## 2017-08-31 MED ORDER — SODIUM CHLORIDE 0.9 % IJ SOLN
10.0000 mL | INTRAMUSCULAR | Status: DC | PRN
  Administered 2017-08-31: 10 mL via INTRAVENOUS
  Filled 2017-08-31: qty 10

## 2017-08-31 MED ORDER — HEPARIN SOD (PORK) LOCK FLUSH 100 UNIT/ML IV SOLN
500.0000 [IU] | Freq: Once | INTRAVENOUS | Status: AC | PRN
  Administered 2017-08-31: 500 [IU]
  Filled 2017-08-31: qty 5

## 2017-08-31 MED ORDER — SODIUM CHLORIDE 0.9% FLUSH
10.0000 mL | INTRAVENOUS | Status: DC | PRN
  Administered 2017-08-31: 10 mL
  Filled 2017-08-31: qty 10

## 2017-08-31 NOTE — Patient Instructions (Signed)
[facility] Discharge Instructions for Patients Receiving Chemotherapy  Today you received the following chemotherapy agents: Panitumumab (VECTIBIX)  To help prevent nausea and vomiting after your treatment, we encourage you to take your nausea medication as directed.    If you develop nausea and vomiting that is not controlled by your nausea medication, call the clinic.   BELOW ARE SYMPTOMS THAT SHOULD BE REPORTED IMMEDIATELY:  *FEVER GREATER THAN 100.5 F  *CHILLS WITH OR WITHOUT FEVER  NAUSEA AND VOMITING THAT IS NOT CONTROLLED WITH YOUR NAUSEA MEDICATION  *UNUSUAL SHORTNESS OF BREATH  *UNUSUAL BRUISING OR BLEEDING  TENDERNESS IN MOUTH AND THROAT WITH OR WITHOUT PRESENCE OF ULCERS  *URINARY PROBLEMS  *BOWEL PROBLEMS  UNUSUAL RASH Items with * indicate a potential emergency and should be followed up as soon as possible.  Feel free to call the clinic you have any questions or concerns. The clinic phone number is [phone].  Please show the [facility] at check-in to the Emergency Department and triage nurse.

## 2017-08-31 NOTE — Progress Notes (Signed)
[facility] OFFICE PROGRESS NOTE   Diagnosis: Colon cancer  INTERVAL HISTORY:   [name] continue Xeloda/panitumumab.  She last received panitumumab 08/10/2017.  No mouth sores or diarrhea.  She has a mild skin rash.  She has linear ulcers at several fingertips. She no longer has abdominal pain since starting a regular laxative regimen.  Objective:  Vital signs in last 24 hours:  Blood pressure (!) 144/93, pulse 76, temperature 98.5 F (36.9 C), temperature source Oral, resp. rate 16, height _0  (1.651 m), weight 127 lb 8 oz (57.8 kg), SpO2 100 %.    HEENT: No thrush or ulcers Resp: Lungs clear bilaterally Cardio: Regular rate and rhythm GI: No hepatomegaly, left lower quadrant colostomy, no mass, nontender Vascular: No leg edema  Skin: Mild acne type rash over the upper abdomen/lower anterior chest, 1 linear ulcer at the distal finger, dryness of the palms and soles without skin breakdown, diffuse hyperpigmentation  Portacath/PICC-without erythema  Lab Results:  Lab Results  Component Value Date   WBC 4.6 08/31/2017   HGB 13.6 08/31/2017   HCT 41.3 08/31/2017   MCV 89.6 08/31/2017   PLT 365 08/31/2017   NEUTROABS 2.9 08/31/2017    Medications: I have reviewed the patient's current medications.  Assessment/Plan: 1.Metastatic colorectal cancer-status post an exploratory laparotomy 03/03/2016 revealing a proximal rectal mass, peritoneal carcinomatosis, and liver metastases  Biopsy of peritoneal nodules 03/03/2016 confirmed metastatic adenocarcinoma consistent with a colon primary  Foundation 1 testing-MSI-stable, tumor mutation burden-low, no BRAF NRAS or KRAS mutation  Cycle 1 FOLFIRI/PANITUMUMAB 04/14/2016  Cycle 2 FOLFIRI/panitumumab 04/28/2016  Cycle 3 FOLFIRI/panitumumab 05/12/2016  Cycle 4 FOLFIRI/panitumumab 05/27/2016  Cycle 5 FOLFIRI/panitumumab 06/09/2016  Restaging CT abdomen/pelvis 06/20/2016-no evidence of disease progression,  decreased left hepatic lesion  Cycle 6 FOLFIRI/panitumumab 06/23/2016  Cycle 7 FOLFIRI/panitumumab 07/07/2016  Cycle 8 FOLFIRI/panitumumab 07/21/2016  Cycle 9 FOLFIRI/PANITUMUMAB 08/04/2016  Cycle 10 FOLFIRI/panitumumab 08/18/2016  Restaging CT 08/29/2016 with interval decrease in the size of the medial segment left liver lesion. Lesion identified previously in the rectosigmoid colon not evident on the current study.  5-FU/PANITUMUMAB 09/01/2016  Xeloda 7 days on/7 days off and PANITUMUMAB every 3 weeks 09/15/2016 (awaiting insurance approval for Xeloda 09/15/2016)  CT abdomen/pelvis 12/25/2016-unchanged 5 mm right hepatic lesion, resolution of medial segment left liver lesion, no new liver lesion. Residual soft tissue fullness in the sigmoid , persistent distal esophageal wall thickening  Continuationof PANITUMUMAB every 3 weeks and Xeloda 7 days on/7 days off  CT 06/19/2017-new cystic/solid lesion in the left adnexa  Xeloda/panitumumab continued  2.History of aBowel obstruction secondary to #1  3. Chronic back pain  4. Essential thrombocytosis  5. Early obstruction of the left ureter noted at the time of surgery 03/03/2016-Dr. Tannenbaum placed a left double-J stent 03/18/2016  6. Abdominal wall cellulitis 03/10/2016, blood cultures positive for coagulase negative staphylococcus-methicillin-resistant, status post treatment with vancomycin and Bactrim  7. Tachycardia-persistent following discharge from the hospital, likely related to anemia and deconditioning  CT chest 04/04/2016-negative for pulmonary embolism.  8. Port-A-Cath placement 04/10/2016, interventional radiology  9. Iron deficiency anemia. Feraheme 11/05/2015 , 11/12/2015,and 04/14/2016-persistent anemia and red cell microcytosis, oral iron resumed 11/17/2016-stable  10. Pain/tenderness left posterior iliac region-resolved  11. Hypokalemia-started on potassium replacement  06/09/2016  12. Skin rashand paronychiasecondary to [facility], she continues minocycline, moisturizers, and fluticasone   Disposition:  Carolyn Barton appears stable.  No clinical evidence for progression of the colon cancer.  We will follow-up on the CEA from today.  She will continue Xeloda/panitumumab.  The plan is to schedule a restaging CT within the next few months to evaluate the pelvic lesion noted on the September 2018 CT.  15 minutes were spent with the patient today.  The majority of the time was used for counseling and coordination of care.  [name] Coder, MD  08/31/2017  11:19 AM

## 2017-08-31 NOTE — Telephone Encounter (Signed)
Gave avs and calendar for January 2019 °

## 2017-09-01 ENCOUNTER — Telehealth: Payer: Self-pay | Admitting: *Deleted

## 2017-09-01 ENCOUNTER — Other Ambulatory Visit: Payer: Self-pay | Admitting: *Deleted

## 2017-09-01 DIAGNOSIS — C19 Malignant neoplasm of rectosigmoid junction: Secondary | ICD-10-CM

## 2017-09-01 MED ORDER — CAPECITABINE 500 MG PO TABS: ORAL_TABLET | ORAL | 5 refills | Status: DC

## 2017-09-01 NOTE — Telephone Encounter (Signed)
-----   Message from Ladell Pier, MD sent at 08/31/2017  5:12 PM EST ----- Please call patient, cea is stable, f/u as scheduled

## 2017-09-01 NOTE — Telephone Encounter (Signed)
Left message informing pt of normal lab.

## 2017-09-20 ENCOUNTER — Other Ambulatory Visit: Payer: Self-pay | Admitting: Oncology

## 2017-09-21 ENCOUNTER — Other Ambulatory Visit (HOSPITAL_BASED_OUTPATIENT_CLINIC_OR_DEPARTMENT_OTHER): Payer: BLUE CROSS/BLUE SHIELD

## 2017-09-21 ENCOUNTER — Ambulatory Visit (HOSPITAL_BASED_OUTPATIENT_CLINIC_OR_DEPARTMENT_OTHER): Payer: BLUE CROSS/BLUE SHIELD | Admitting: Oncology

## 2017-09-21 ENCOUNTER — Telehealth: Payer: Self-pay | Admitting: Oncology

## 2017-09-21 ENCOUNTER — Ambulatory Visit: Payer: BLUE CROSS/BLUE SHIELD

## 2017-09-21 ENCOUNTER — Ambulatory Visit (HOSPITAL_BASED_OUTPATIENT_CLINIC_OR_DEPARTMENT_OTHER): Payer: BLUE CROSS/BLUE SHIELD

## 2017-09-21 ENCOUNTER — Encounter: Payer: Self-pay | Admitting: Oncology

## 2017-09-21 VITALS — BP 140/83 | HR 83 | Temp 98.0°F | Resp 18 | Ht 65.0 in | Wt 130.2 lb

## 2017-09-21 DIAGNOSIS — C786 Secondary malignant neoplasm of retroperitoneum and peritoneum: Secondary | ICD-10-CM

## 2017-09-21 DIAGNOSIS — C19 Malignant neoplasm of rectosigmoid junction: Secondary | ICD-10-CM

## 2017-09-21 DIAGNOSIS — C20 Malignant neoplasm of rectum: Secondary | ICD-10-CM

## 2017-09-21 DIAGNOSIS — E876 Hypokalemia: Secondary | ICD-10-CM | POA: Diagnosis not present

## 2017-09-21 DIAGNOSIS — Z5112 Encounter for antineoplastic immunotherapy: Secondary | ICD-10-CM

## 2017-09-21 DIAGNOSIS — M549 Dorsalgia, unspecified: Secondary | ICD-10-CM

## 2017-09-21 DIAGNOSIS — C787 Secondary malignant neoplasm of liver and intrahepatic bile duct: Secondary | ICD-10-CM

## 2017-09-21 DIAGNOSIS — Z95828 Presence of other vascular implants and grafts: Secondary | ICD-10-CM

## 2017-09-21 DIAGNOSIS — D509 Iron deficiency anemia, unspecified: Secondary | ICD-10-CM

## 2017-09-21 DIAGNOSIS — L98499 Non-pressure chronic ulcer of skin of other sites with unspecified severity: Secondary | ICD-10-CM

## 2017-09-21 LAB — COMPREHENSIVE METABOLIC PANEL
ALBUMIN: 3.2 g/dL — AB (ref 3.5–5.0)
ALK PHOS: 105 U/L (ref 40–150)
ALT: 12 U/L (ref 0–55)
ANION GAP: 8 meq/L (ref 3–11)
AST: 19 U/L (ref 5–34)
BILIRUBIN TOTAL: 0.41 mg/dL (ref 0.20–1.20)
BUN: 9.3 mg/dL (ref 7.0–26.0)
CALCIUM: 8.7 mg/dL (ref 8.4–10.4)
CO2: 28 mEq/L (ref 22–29)
Chloride: 103 mEq/L (ref 98–109)
Creatinine: 0.6 mg/dL (ref 0.6–1.1)
Glucose: 92 mg/dl (ref 70–140)
POTASSIUM: 3.3 meq/L — AB (ref 3.5–5.1)
SODIUM: 139 meq/L (ref 136–145)
TOTAL PROTEIN: 6.2 g/dL — AB (ref 6.4–8.3)

## 2017-09-21 LAB — CBC WITH DIFFERENTIAL/PLATELET
BASO%: 1 % (ref 0.0–2.0)
Basophils Absolute: 0 10*3/uL (ref 0.0–0.1)
EOS%: 5.6 % (ref 0.0–7.0)
Eosinophils Absolute: 0.3 10*3/uL (ref 0.0–0.5)
HEMATOCRIT: 41.7 % (ref 34.8–46.6)
HGB: 13.6 g/dL (ref 11.6–15.9)
LYMPH#: 1 10*3/uL (ref 0.9–3.3)
LYMPH%: 21.7 % (ref 14.0–49.7)
MCH: 29.3 pg (ref 25.1–34.0)
MCHC: 32.7 g/dL (ref 31.5–36.0)
MCV: 89.5 fL (ref 79.5–101.0)
MONO#: 0.5 10*3/uL (ref 0.1–0.9)
MONO%: 10.6 % (ref 0.0–14.0)
NEUT#: 2.9 10*3/uL (ref 1.5–6.5)
NEUT%: 61.1 % (ref 38.4–76.8)
PLATELETS: 357 10*3/uL (ref 145–400)
RBC: 4.66 10*6/uL (ref 3.70–5.45)
RDW: 17.1 % — AB (ref 11.2–14.5)
WBC: 4.7 10*3/uL (ref 3.9–10.3)

## 2017-09-21 LAB — CEA (IN HOUSE-CHCC): CEA (CHCC-IN HOUSE): 1.16 ng/mL (ref 0.00–5.00)

## 2017-09-21 LAB — MAGNESIUM: Magnesium: 2.1 mg/dl (ref 1.5–2.5)

## 2017-09-21 MED ORDER — POTASSIUM CHLORIDE ER 10 MEQ PO TBCR: 10.0000 meq | EXTENDED_RELEASE_TABLET | Freq: Two times a day (BID) | ORAL | 1 refills | Status: AC

## 2017-09-21 MED ORDER — PANITUMUMAB CHEMO INJECTION 100 MG/5ML
300.0000 mg | Freq: Once | INTRAVENOUS | Status: AC
  Administered 2017-09-21: 300 mg via INTRAVENOUS
  Filled 2017-09-21: qty 15

## 2017-09-21 MED ORDER — HEPARIN SOD (PORK) LOCK FLUSH 100 UNIT/ML IV SOLN
500.0000 [IU] | Freq: Once | INTRAVENOUS | Status: AC | PRN
  Administered 2017-09-21: 500 [IU]
  Filled 2017-09-21: qty 5

## 2017-09-21 MED ORDER — SODIUM CHLORIDE 0.9 % IV SOLN
Freq: Once | INTRAVENOUS | Status: AC
  Administered 2017-09-21: 10:00:00 via INTRAVENOUS

## 2017-09-21 MED ORDER — SODIUM CHLORIDE 0.9 % IJ SOLN
10.0000 mL | INTRAMUSCULAR | Status: DC | PRN
  Administered 2017-09-21: 10 mL via INTRAVENOUS
  Filled 2017-09-21: qty 10

## 2017-09-21 MED ORDER — SODIUM CHLORIDE 0.9% FLUSH
10.0000 mL | INTRAVENOUS | Status: DC | PRN
  Administered 2017-09-21: 10 mL
  Filled 2017-09-21: qty 10

## 2017-09-21 NOTE — Telephone Encounter (Signed)
Gave avs and calendar for February  °

## 2017-09-21 NOTE — Progress Notes (Signed)
[facility] OFFICE PROGRESS NOTE   Diagnosis: Colon cancer  INTERVAL HISTORY:   Carolyn Barton returns as scheduled.  She continues Xeloda/panitumumab.  No mouth sores, diarrhea, or hand/foot pain.  No abdominal pain.  She has linear ulcers at several fingertips.  The skin breakdown at the parietal scalp has resolved.  Objective:  Vital signs in last 24 hours:  Blood pressure 140/83, pulse 83, temperature 98 F (36.7 C), temperature source Oral, resp. rate 18, height _0  (1.651 m), weight 130 lb 3.2 oz (59.1 kg), SpO2 100 %.    HEENT: No thrush or ulcers Resp: Lungs clear bilaterally Cardio: Regular rate and rhythm GI: No hepatomegaly, no mass, nontender, left lower quadrant colostomy Vascular: The left lower leg is slightly larger than the right side  Skin: Linear ulcerations at several fingertips of both hands.  Palms and soles without erythema or skin breakdown  Portacath/PICC-without erythema  Lab Results:  Lab Results  Component Value Date   WBC 4.7 09/21/2017   HGB 13.6 09/21/2017   HCT 41.7 09/21/2017   MCV 89.5 09/21/2017   PLT 357 09/21/2017   NEUTROABS 2.9 09/21/2017    CMP     Component Value Date/Time   NA 139 09/21/2017 0746   K 3.3 (L) 09/21/2017 0746   CL 99 (L) 04/10/2016 1206   CL 101 12/01/2012 0924   CO2 28 09/21/2017 0746   GLUCOSE 92 09/21/2017 0746   GLUCOSE 94 12/01/2012 0924   BUN 9.3 09/21/2017 0746   CREATININE 0.6 09/21/2017 0746   CALCIUM 8.7 09/21/2017 0746   PROT 6.2 (L) 09/21/2017 0746   ALBUMIN 3.2 (L) 09/21/2017 0746   AST 19 09/21/2017 0746   ALT 12 09/21/2017 0746   ALKPHOS 105 09/21/2017 0746   BILITOT 0.41 09/21/2017 0746   GFRNONAA >60 04/10/2016 1206   GFRAA >60 04/10/2016 1206    Lab Results  Component Value Date   CEA1 1.16 09/21/2017     Medications: I have reviewed the patient's current medications.   Assessment/Plan: 1.Metastatic colorectal cancer-status post an exploratory laparotomy  03/03/2016 revealing a proximal rectal mass, peritoneal carcinomatosis, and liver metastases  Biopsy of peritoneal nodules 03/03/2016 confirmed metastatic adenocarcinoma consistent with a colon primary  Foundation 1 testing-MSI-stable, tumor mutation burden-low, no BRAF NRAS or KRAS mutation  Cycle 1 FOLFIRI/PANITUMUMAB 04/14/2016  Cycle 2 FOLFIRI/panitumumab 04/28/2016  Cycle 3 FOLFIRI/panitumumab 05/12/2016  Cycle 4 FOLFIRI/panitumumab 05/27/2016  Cycle 5 FOLFIRI/panitumumab 06/09/2016  Restaging CT abdomen/pelvis 06/20/2016-no evidence of disease progression, decreased left hepatic lesion  Cycle 6 FOLFIRI/panitumumab 06/23/2016  Cycle 7 FOLFIRI/panitumumab 07/07/2016  Cycle 8 FOLFIRI/panitumumab 07/21/2016  Cycle 9 FOLFIRI/PANITUMUMAB 08/04/2016  Cycle 10 FOLFIRI/panitumumab 08/18/2016  Restaging CT 08/29/2016 with interval decrease in the size of the medial segment left liver lesion. Lesion identified previously in the rectosigmoid colon not evident on the current study.  5-FU/PANITUMUMAB 09/01/2016  Xeloda 7 days on/7 days off and PANITUMUMAB every 3 weeks 09/15/2016 (awaiting insurance approval for Xeloda 09/15/2016)  CT abdomen/pelvis 12/25/2016-unchanged 5 mm right hepatic lesion, resolution of medial segment left liver lesion, no new liver lesion. Residual soft tissue fullness in the sigmoid , persistent distal esophageal wall thickening  Continuationof PANITUMUMAB every 3 weeks and Xeloda 7 days on/7 days off  CT 06/19/2017-new cystic/solid lesion in the left adnexa  Xeloda/panitumumab continued  2.History of aBowel obstruction secondary to #1  3. Chronic back pain  4. Essential thrombocytosis  5. Early obstruction of the left ureter noted at the time of surgery  03/03/2016-DrGaynelle Arabian placed a left double-J stent 03/18/2016  6. Abdominal wall cellulitis 03/10/2016, blood cultures positive for coagulase negative  staphylococcus-methicillin-resistant, status post treatment with vancomycin and Bactrim  7. Tachycardia-persistent following discharge from the hospital, likely related to anemia and deconditioning  CT chest 04/04/2016-negative for pulmonary embolism.  8. Port-A-Cath placement 04/10/2016, interventional radiology  9. Iron deficiency anemia. Feraheme 11/05/2015 , 11/12/2015,and 04/14/2016-persistent anemia and red cell microcytosis, oral iron resumed 11/17/2016-stable  10. Pain/tenderness left posterior iliac region-resolved  11. Hypokalemia-started on potassium replacement 06/09/2016  12. Skin rashand paronychiasecondary to [facility], she continues minocycline, moisturizers, and fluticasone   [name] appears unchanged.  She will continue Neosporin and Vaseline for treatment of the finger ulcers.  The plan is to continue Xeloda/panitumumab.  She will increase the potassium replacement to 20 mEq daily.  Carolyn Barton will undergo a restaging CT prior to an office visit on 10/12/2016.  15 minutes were spent with the patient today.  The majority of the time was used for counseling and coordination of care.  [name] Coder, MD  09/21/2017  9:23 AM

## 2017-09-21 NOTE — Patient Instructions (Signed)
[facility] Discharge Instructions for Patients Receiving Chemotherapy  Today you received the following chemotherapy agents: Panitumumab (VECTIBIX)  To help prevent nausea and vomiting after your treatment, we encourage you to take your nausea medication as directed.    If you develop nausea and vomiting that is not controlled by your nausea medication, call the clinic.   BELOW ARE SYMPTOMS THAT SHOULD BE REPORTED IMMEDIATELY:  *FEVER GREATER THAN 100.5 F  *CHILLS WITH OR WITHOUT FEVER  NAUSEA AND VOMITING THAT IS NOT CONTROLLED WITH YOUR NAUSEA MEDICATION  *UNUSUAL SHORTNESS OF BREATH  *UNUSUAL BRUISING OR BLEEDING  TENDERNESS IN MOUTH AND THROAT WITH OR WITHOUT PRESENCE OF ULCERS  *URINARY PROBLEMS  *BOWEL PROBLEMS  UNUSUAL RASH Items with * indicate a potential emergency and should be followed up as soon as possible.  Feel free to call the clinic you have any questions or concerns. The clinic phone number is [phone].  Please show the [facility] [place] at check-in to the Emergency Department and triage nurse.

## 2017-09-29 DIAGNOSIS — Z86711 Personal history of pulmonary embolism: Secondary | ICD-10-CM

## 2017-09-29 HISTORY — DX: Personal history of pulmonary embolism: Z86.711

## 2017-10-08 ENCOUNTER — Ambulatory Visit (HOSPITAL_COMMUNITY)
Admission: RE | Admit: 2017-10-08 | Discharge: 2017-10-08 | Disposition: A | Discharge status: Home / Self Care | Payer: BLUE CROSS/BLUE SHIELD | Source: Ambulatory Visit | Attending: Surgery | Admitting: Surgery

## 2017-10-08 DIAGNOSIS — C19 Malignant neoplasm of rectosigmoid junction: Secondary | ICD-10-CM

## 2017-10-08 DIAGNOSIS — N1330 Unspecified hydronephrosis: Secondary | ICD-10-CM | POA: Diagnosis not present

## 2017-10-08 DIAGNOSIS — R1900 Intra-abdominal and pelvic swelling, mass and lump, unspecified site: Secondary | ICD-10-CM | POA: Insufficient documentation

## 2017-10-08 DIAGNOSIS — C785 Secondary malignant neoplasm of large intestine and rectum: Secondary | ICD-10-CM | POA: Insufficient documentation

## 2017-10-08 MED ORDER — IOPAMIDOL (ISOVUE-300) INJECTION 61%
30.0000 mL | Freq: Once | INTRAVENOUS | Status: AC | PRN
  Administered 2017-10-08: 30 mL via ORAL

## 2017-10-08 MED ORDER — IOPAMIDOL (ISOVUE-300) INJECTION 61%
INTRAVENOUS | Status: AC
  Administered 2017-10-08: 30 mL via ORAL
  Filled 2017-10-08: qty 30

## 2017-10-08 MED ORDER — IOPAMIDOL (ISOVUE-300) INJECTION 61%
INTRAVENOUS | Status: AC
  Filled 2017-10-08: qty 100

## 2017-10-08 MED ORDER — IOPAMIDOL (ISOVUE-300) INJECTION 61%
100.0000 mL | Freq: Once | INTRAVENOUS | Status: AC | PRN
  Administered 2017-10-08: 100 mL via INTRAVENOUS

## 2017-10-11 ENCOUNTER — Encounter (HOSPITAL_COMMUNITY): Payer: Self-pay | Admitting: *Deleted

## 2017-10-11 ENCOUNTER — Emergency Department (HOSPITAL_COMMUNITY)
Admission: EM | Admit: 2017-10-11 | Discharge: 2017-10-11 | Disposition: A | Discharge status: Home / Self Care | Payer: BLUE CROSS/BLUE SHIELD | Attending: Emergency Medicine | Admitting: Emergency Medicine

## 2017-10-11 ENCOUNTER — Other Ambulatory Visit: Payer: Self-pay | Admitting: Oncology

## 2017-10-11 ENCOUNTER — Other Ambulatory Visit: Payer: Self-pay

## 2017-10-11 ENCOUNTER — Emergency Department (HOSPITAL_COMMUNITY): Payer: BLUE CROSS/BLUE SHIELD

## 2017-10-11 DIAGNOSIS — R1031 Right lower quadrant pain: Secondary | ICD-10-CM | POA: Diagnosis present

## 2017-10-11 DIAGNOSIS — N131 Hydronephrosis with ureteral stricture, not elsewhere classified: Secondary | ICD-10-CM | POA: Diagnosis not present

## 2017-10-11 DIAGNOSIS — Z79899 Other long term (current) drug therapy: Secondary | ICD-10-CM | POA: Insufficient documentation

## 2017-10-11 DIAGNOSIS — Z87891 Personal history of nicotine dependence: Secondary | ICD-10-CM | POA: Diagnosis not present

## 2017-10-11 DIAGNOSIS — Z85038 Personal history of other malignant neoplasm of large intestine: Secondary | ICD-10-CM | POA: Insufficient documentation

## 2017-10-11 DIAGNOSIS — C787 Secondary malignant neoplasm of liver and intrahepatic bile duct: Secondary | ICD-10-CM | POA: Diagnosis not present

## 2017-10-11 DIAGNOSIS — N134 Hydroureter: Secondary | ICD-10-CM | POA: Diagnosis not present

## 2017-10-11 DIAGNOSIS — N1339 Other hydronephrosis: Secondary | ICD-10-CM

## 2017-10-11 DIAGNOSIS — R1903 Right lower quadrant abdominal swelling, mass and lump: Secondary | ICD-10-CM | POA: Diagnosis not present

## 2017-10-11 LAB — CBC WITH DIFFERENTIAL/PLATELET
BASOS PCT: 0 %
Basophils Absolute: 0 10*3/uL (ref 0.0–0.1)
EOS ABS: 0.1 10*3/uL (ref 0.0–0.7)
EOS PCT: 1 %
HCT: 43.3 % (ref 36.0–46.0)
Hemoglobin: 14 g/dL (ref 12.0–15.0)
LYMPHS ABS: 0.6 10*3/uL — AB (ref 0.7–4.0)
Lymphocytes Relative: 7 %
MCH: 29.7 pg (ref 26.0–34.0)
MCHC: 32.3 g/dL (ref 30.0–36.0)
MCV: 91.7 fL (ref 78.0–100.0)
MONO ABS: 0.5 10*3/uL (ref 0.1–1.0)
Monocytes Relative: 6 %
Neutro Abs: 7.4 10*3/uL (ref 1.7–7.7)
Neutrophils Relative %: 86 %
Platelets: 415 10*3/uL — ABNORMAL HIGH (ref 150–400)
RBC: 4.72 MIL/uL (ref 3.87–5.11)
RDW: 16.6 % — AB (ref 11.5–15.5)
WBC: 8.6 10*3/uL (ref 4.0–10.5)

## 2017-10-11 LAB — BASIC METABOLIC PANEL
Anion gap: 8 (ref 5–15)
BUN: 9 mg/dL (ref 6–20)
CALCIUM: 9 mg/dL (ref 8.9–10.3)
CHLORIDE: 102 mmol/L (ref 101–111)
CO2: 26 mmol/L (ref 22–32)
CREATININE: 0.48 mg/dL (ref 0.44–1.00)
GFR calc Af Amer: >60 mL/min (ref >60)
GFR calc non Af Amer: >60 mL/min (ref >60)
Glucose, Bld: 101 mg/dL — ABNORMAL HIGH (ref 65–99)
Potassium: 3.9 mmol/L (ref 3.5–5.1)
SODIUM: 136 mmol/L (ref 135–145)

## 2017-10-11 LAB — URINALYSIS, ROUTINE W REFLEX MICROSCOPIC
BILIRUBIN URINE: NEGATIVE
Glucose, UA: 50 mg/dL — AB
KETONES UR: 5 mg/dL — AB
NITRITE: NEGATIVE
PH: 6 (ref 5.0–8.0)
Protein, ur: NEGATIVE mg/dL
SPECIFIC GRAVITY, URINE: 1.009 (ref 1.005–1.030)
Squamous Epithelial / LPF: NONE SEEN

## 2017-10-11 MED ORDER — HYDROMORPHONE HCL 1 MG/ML IJ SOLN
1.0000 mg | Freq: Once | INTRAMUSCULAR | Status: AC
  Administered 2017-10-11: 1 mg via INTRAMUSCULAR
  Filled 2017-10-11: qty 1

## 2017-10-11 MED ORDER — SODIUM CHLORIDE 0.9 % IV SOLN
INTRAVENOUS | Status: DC
  Administered 2017-10-11: 19:00:00 via INTRAVENOUS

## 2017-10-11 MED ORDER — HYDROMORPHONE HCL 1 MG/ML IJ SOLN
2.0000 mg | Freq: Once | INTRAMUSCULAR | Status: AC
  Administered 2017-10-11: 2 mg via INTRAMUSCULAR
  Filled 2017-10-11: qty 2

## 2017-10-11 MED ORDER — HEPARIN SOD (PORK) LOCK FLUSH 100 UNIT/ML IV SOLN
500.0000 [IU] | Freq: Once | INTRAVENOUS | Status: AC
  Administered 2017-10-11: 500 [IU]
  Filled 2017-10-11: qty 5

## 2017-10-11 MED ORDER — KETOROLAC TROMETHAMINE 30 MG/ML IJ SOLN
15.0000 mg | Freq: Once | INTRAMUSCULAR | Status: AC
  Administered 2017-10-11: 15 mg via INTRAVENOUS
  Filled 2017-10-11: qty 1

## 2017-10-11 NOTE — ED Notes (Signed)
Patient transported to CT 

## 2017-10-11 NOTE — ED Provider Notes (Signed)
[facility] DEPT Provider Note   CSN: [id number] Arrival date & time: 10/11/17  1655     History   Chief Complaint Chief Complaint  Patient presents with  . Flank Pain    HPI SHARENE KRIKORIAN is a 62 y.o. female.  62 year old female presents with acute onset of right-sided flank pain which began this morning.  Pain is been persistent and nonradiating to her groin.  No dysuria or hematuria.  No prior history of kidney stones.  No rashes to the area.  Took methadone without relief.  No cough or congestion.  No fever or chills.  Nothing makes her symptoms better or worse.  No prior history of same      Past Medical History:  Diagnosis Date  . Chronic back pain    lumbar    . Colostomy in place [facility])   . 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 Hiatal hernia   . Hypokalemia    takes potassium  . Iron deficiency anemia 04/14/2016   treated w/ Iron infusions  . Liver metastasis ([place])    secondary to colorectal cancer  . Metastatic colorectal cancer [facility]) dx 03/03/2016--- oncologist-- dr Benay Spice   metastatic colorectal carcinoma--  s/p  exp. lap. for bowel obstruction--  rectal mass, peritoneal carcinomatosis, liver mets---  chemotherapy  . PONV (postoperative nausea and vomiting)   . Tachycardia    persistant since discharged from hospital 06/ 2017 due to anemia and deconditioning;  as of 09-02-2016 per pt no issues w/ heart racing in the past few weeks  . Ureteral obstruction, left   . Wears contact lenses     Patient Active Problem List   Diagnosis Date Noted  . Port-A-Cath in place 07/27/2017  . Port catheter in place 04/14/2016  . Iron deficiency anemia 04/14/2016  . Rectal cancer ([place]) 03/20/2016  . Leukocytosis   . Cellulitis 03/10/2016  . Malnutrition of moderate degree 03/07/2016  . Metastatic colorectal cancer ([place]) 03/03/2016  . Bowel obstruction ([place]) 02/29/2016  . Mass of colon 02/29/2016  . Nausea &  vomiting 02/29/2016  . Lumbar radiculopathy 01/26/2014  . Essential thrombocytosis ([place]) 11/10/2011    Past Surgical History:  Procedure Laterality Date  . BIOPSY N/A 03/03/2016   Procedure: BIOPSY OF PERITONEAL NODULE;  Surgeon: Erroll Luna, MD;  Location: [place];  Service: General;  Laterality: N/A;  . COLOSTOMY N/A 03/03/2016   Procedure: DIVERTING SIGMOID COLOSTOMY;  Surgeon: Erroll Luna, MD;  Location: [place];  Service: General;  Laterality: N/A;  . CYSTOSCOPY W/ URETERAL STENT PLACEMENT Left 10/09/2016   Procedure: CYSTOSCOPY WITH LEFT STENT REPLACEMENT 88F POLARIS STENT 24CM;  Surgeon: Carolan Clines, MD;  Location: [facility];  Service: Urology;  Laterality: Left;  . CYSTOSCOPY W/ URETERAL STENT PLACEMENT Left 03/16/2017   Procedure: CYSTOSCOPY WITH RETROGRADE PYELOGRAM WITH LEFT  STENT REMOVAL AND REPLACEMENT;  Surgeon: Carolan Clines, MD;  Location: [facility];  Service: Urology;  Laterality: Left;  . CYSTOSCOPY WITH RETROGRADE PYELOGRAM, URETEROSCOPY AND STENT PLACEMENT Left 03/18/2016   Procedure: CYSTOSCOPY WITH LEFT  RETROGRADE PYELOGRAM,  AND  POLARIS STENT PLACEMENT;  Surgeon: Carolan Clines, MD;  Location: [facility];  Service: Urology;  Laterality: Left;  . LAPAROTOMY N/A 03/03/2016   Procedure: EXPLORATORY LAPAROTOMY;  Surgeon: Erroll Luna, MD;  Location: [place];  Service: General;  Laterality: N/A;  . LUMBAR LAMINECTOMY  11/11/2012   L4-5  and Resection synovial cyst  . MAXIMUM ACCESS (  MAS)POSTERIOR LUMBAR INTERBODY FUSION (PLIF) 1 LEVEL N/A 01/11/2014   Procedure: FOR MAXIMUM ACCESS (MAS) POSTERIOR LUMBAR INTERBODY FUSION Lumbar Five Sacral One;  Surgeon: [name], MD;  Location: [facility];  Service: Neurosurgery;  Laterality: N/A;  FOR MAXIMUM ACCESS (MAS) POSTERIOR LUMBAR INTERBODY FUSION Lumbar Five Sacral One  . PORTACATH PLACEMENT  04/10/2016  . REPAIR CEREBROSPINAL FLUID LEAK POST RESECTION SYNOVIAL CYST  12/21/2012  .  REVISION LUMBAR HARDWARE  01/26/2014  . TRANSTHORACIC ECHOCARDIOGRAM  06/17/2016   grade 1 diastolic function , ef 73-71%/  trivial TR  . WISDOM TOOTH EXTRACTION      OB History    No data available       Home Medications    Prior to Admission medications   Medication Sig Start Date End Date Taking? Authorizing Provider  capecitabine (XELODA) 500 MG tablet TAKE 3 TABLETS (1500 MG) IN AM AND 2 TABLETS (1000 MG) IN PM-TOTAL 2500 MG DAILY. TAKE ON DAYS 1-7 AND 15-21 OF EACH 28 DAY CYCLE. 08/24/17   Ladell Pier, MD  DULoxetine (CYMBALTA) 60 MG capsule Take 1 capsule (60 mg total) by mouth every evening. 06/15/17   Ladell Pier, MD  ferrous sulfate 325 (65 FE) MG EC tablet Take 1 tablet (325 mg total) by mouth daily. 11/17/16   Ladell Pier, MD  fluticasone (CUTIVATE) 0.05 % cream Apply topically 2 (two) times daily. 11/17/16   Ladell Pier, MD  furosemide (LASIX) 20 MG tablet Take 20 mg by mouth every morning.  08/29/15   [provider]  HYDROmorphone (DILAUDID) 4 MG tablet Take 4 mg by mouth every 4 (four) hours as needed for severe pain.     [provider]  lidocaine-prilocaine (EMLA) cream Apply to port site one hour prior to use. Do not rub in. Cover with plastic. 03/02/17   Owens Shark, NP  loperamide (IMODIUM A-D) 2 MG capsule Take by mouth as needed for diarrhea or loose stools.    [provider]  Meth-Hyo-M Bl-Na Phos-Ph Sal (URIBEL) 118 MG CAPS Take 1 capsule (118 mg total) by mouth 3 times/day as needed-between meals & bedtime. 03/16/17   Carolan Clines, MD  methadone (DOLOPHINE) 10 MG tablet Take 10 mg by mouth 3 (three) times daily.     [provider]  minocycline (MINOCIN) 100 MG capsule Take 1 capsule (100 mg total) by mouth 2 (two) times daily. 12/08/16   Ladell Pier, MD  ondansetron (ZOFRAN) 8 MG tablet Take 1 tablet (8 mg total) by mouth every 8 (eight) hours as needed for nausea or vomiting. 04/24/17   Owens Shark, NP  polyethylene glycol (MIRALAX / GLYCOLAX) packet Take 17 g by mouth daily as needed.     [provider]  potassium chloride (K-DUR) 10 MEQ tablet Take 1 tablet (10 mEq total) by mouth 2 (two) times daily. 09/21/17   Ladell Pier, MD  prochlorperazine (COMPAZINE) 10 MG tablet Take 1 tablet (10 mg total) by mouth every 6 (six) hours as needed for nausea or vomiting. 09/15/16   Owens Shark, NP    Family History Family History  Problem Relation Age of Onset  . Heart disease Mother   . Melanoma Mother   . Colon cancer Neg Hx   . Colon polyps Neg Hx   . Rectal cancer Neg Hx   . Stomach cancer Neg Hx   . Esophageal cancer Neg Hx     Social History Social History  Tobacco Use  . Smoking status: Former Smoker    Packs/day: 0.25    Years: 1.00    Pack years: 0.25    Types: Cigarettes    Last attempt to quit: 10/03/1978    Years since quitting: 39.0  . Smokeless tobacco: Never Used  Substance Use Topics  . Alcohol use: No    Alcohol/week: 0.0 oz  . Drug use: No     Allergies   Patient has no known allergies.   Review of Systems Review of Systems  All other systems reviewed and are negative.    Physical Exam Updated Vital Signs BP (!) 153/93 (BP Location: Left Arm)   Pulse 78   Temp 98 F (36.7 C) Comment: pt drinking water  Resp 20   Ht 1.651 m (5\' 5" )   Wt 59 kg (130 lb)   SpO2 100%   BMI 21.63 kg/m   Physical Exam  Constitutional: She is oriented to person, place, and time. She appears well-developed and well-nourished.  Non-toxic appearance. No distress.  HENT:  Head: Normocephalic and atraumatic.  Eyes: Conjunctivae, EOM and lids are normal. Pupils are equal, round, and reactive to light.  Neck: Normal range of motion. Neck supple. No tracheal deviation present. No thyroid mass present.  Cardiovascular: Normal rate, regular rhythm and normal heart sounds. Exam reveals no gallop.  No murmur heard. Pulmonary/Chest: Effort normal and  breath sounds normal. No stridor. No respiratory distress. She has no decreased breath sounds. She has no wheezes. She has no rhonchi. She has no rales.  Abdominal: Soft. Normal appearance and bowel sounds are normal. She exhibits no distension. There is no tenderness. There is no rebound and no CVA tenderness.  Musculoskeletal: Normal range of motion. She exhibits no edema or tenderness.  Neurological: She is alert and oriented to person, place, and time. She has normal strength. No cranial nerve deficit or sensory deficit. GCS eye subscore is 4. GCS verbal subscore is 5. GCS motor subscore is 6.  Skin: Skin is warm and dry. No abrasion and no rash noted.  Psychiatric: She has a normal mood and affect. Her speech is normal and behavior is normal.  Nursing note and vitals reviewed.    ED Treatments / Results  Labs (all labs ordered are listed, but only abnormal results are displayed) Labs Reviewed  URINALYSIS, ROUTINE W REFLEX MICROSCOPIC    EKG  EKG Interpretation None       Radiology No results found.  Procedures Procedures (including critical care time)  Medications Ordered in ED Medications  HYDROmorphone (DILAUDID) injection 2 mg (not administered)     Initial Impression / Assessment and Plan / ED Course  I have reviewed the triage vital signs and the nursing notes.  Pertinent labs & imaging results that were available during my care of the patient were reviewed by me and considered in my medical decision making (see chart for details).     Patient medicated for pain here with IV hydromorphone.  CT results noted and discussed with urology on-call who has seen the patient.  Patient scheduled to have stent placement this week.  Pain is not controlled after IV Toradol and return precautions given  Final Clinical Impressions(s) / ED Diagnoses   Final diagnoses:  None    ED Discharge Orders    None       Lacretia Leigh, MD 10/11/17 2109

## 2017-10-11 NOTE — ED Triage Notes (Signed)
Woke form sleep around 0400 with severe rt post/flank pain, no urinary difficulties, pt is a chemo pt and is scheduled for chemo tomorrow.

## 2017-10-11 NOTE — Consult Note (Signed)
Urology Consult Note    Requesting Attending Physician:  Lacretia Leigh, MD Service Requesting Consult:  Emergency Department Service Providing Consult: Urology  Consulting Attending: Dr. Louis Meckel    Reason for Consult:  Hydronephrosis   Carolyn Barton is seen in consultation for reasons noted above at the request of Lacretia Leigh, MD on the Emergency Department service.   This is a 62 y.o. yo patient with a history of metastatic colon cancer on immunotherapy. 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 The enlarging mass is 13cm x 13cm and appears to take up the entirety of her pelvis. It is also causing new right sided hydroureteronephrosis to the level of the mass.   She underwent left ureteral stent placement by Dr. 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 She is set up to see Dr. Gilford Rile this Thursday to discuss stent exchange, as it was last exchanged June 2018. Recent imaging demonstrated decreased contrast uptake of right kidney and hydronephrosis, consistent with possible stent obstruction.   She presents today with new right flank pain which awoke her from sleep this AM. She tried taking methadone and hydrocodone at home without improvement in symptoms, thus she presented to the ED. Endorses some nausea treated with PO antinausea medicine, denies vomiting. Denies fevers, chills, infectious symptoms. Denies LUTS: no gross hematuria, burning with urination.   Past Medical History: Past Medical History:  Diagnosis Date  . Chronic back pain    lumbar    . Colostomy in place [facility])   . 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 Hiatal hernia   . Hypokalemia    takes potassium  . Iron deficiency anemia 04/14/2016   treated w/ Iron infusions  . Liver  metastasis ([place])    secondary to colorectal cancer  . Metastatic colorectal cancer [facility]) dx 03/03/2016--- oncologist-- dr Benay Spice   metastatic colorectal carcinoma--  s/p  exp. lap. for bowel obstruction--  rectal mass, peritoneal carcinomatosis, liver mets---  chemotherapy  . PONV (postoperative nausea and vomiting)   . Tachycardia    persistant since discharged from hospital 06/ 2017 due to anemia and deconditioning;  as of 09-02-2016 per pt no issues w/ heart racing in the past few weeks  . Ureteral obstruction, left   . Wears contact lenses     Past Surgical History:  Past Surgical History:  Procedure Laterality Date  . BIOPSY N/A 03/03/2016   Procedure: BIOPSY OF PERITONEAL NODULE;  Surgeon: Erroll Luna, MD;  Location: [place];  Service: General;  Laterality: N/A;  . COLOSTOMY N/A 03/03/2016   Procedure: DIVERTING SIGMOID COLOSTOMY;  Surgeon: Erroll Luna, MD;  Location: [place];  Service: General;  Laterality: N/A;  . CYSTOSCOPY W/ URETERAL STENT PLACEMENT Left 10/09/2016   Procedure: CYSTOSCOPY WITH LEFT STENT REPLACEMENT 90F POLARIS STENT 24CM;  Surgeon: Carolan Clines, MD;  Location: [facility];  Service: Urology;  Laterality: Left;  . CYSTOSCOPY W/ URETERAL STENT PLACEMENT Left 03/16/2017   Procedure: CYSTOSCOPY WITH RETROGRADE PYELOGRAM WITH LEFT  STENT REMOVAL AND REPLACEMENT;  Surgeon: Carolan Clines, MD;  Location: [facility];  Service: Urology;  Laterality: Left;  . CYSTOSCOPY WITH RETROGRADE PYELOGRAM, URETEROSCOPY AND STENT PLACEMENT Left 03/18/2016   Procedure: CYSTOSCOPY WITH LEFT  RETROGRADE PYELOGRAM,  AND  POLARIS STENT PLACEMENT;  Surgeon: Carolan Clines,  MD;  Location: [facility];  Service: Urology;  Laterality: Left;  . LAPAROTOMY N/A 03/03/2016   Procedure: EXPLORATORY LAPAROTOMY;  Surgeon: Erroll Luna, MD;  Location: [place];  Service: General;  Laterality: N/A;  . LUMBAR LAMINECTOMY  11/11/2012   L4-5  and Resection synovial cyst   . MAXIMUM ACCESS (MAS)POSTERIOR LUMBAR INTERBODY FUSION (PLIF) 1 LEVEL N/A 01/11/2014   Procedure: FOR MAXIMUM ACCESS (MAS) POSTERIOR LUMBAR INTERBODY FUSION Lumbar Five Sacral One;  Surgeon: [name], MD;  Location: [place] [facility];  Service: Neurosurgery;  Laterality: N/A;  FOR MAXIMUM ACCESS (MAS) POSTERIOR LUMBAR INTERBODY FUSION Lumbar Five Sacral One  . PORTACATH PLACEMENT  04/10/2016  . REPAIR CEREBROSPINAL FLUID LEAK POST RESECTION SYNOVIAL CYST  12/21/2012  . REVISION LUMBAR HARDWARE  01/26/2014  . TRANSTHORACIC ECHOCARDIOGRAM  06/17/2016   grade 1 diastolic function , ef 69-62%/  trivial TR  . WISDOM TOOTH EXTRACTION      Medication: Current Facility-Administered Medications  Medication Dose Route Frequency Provider Last Rate Last Dose  . 0.9 %  sodium chloride infusion   Intravenous Continuous Lacretia Leigh, MD 20 mL/hr at 10/11/17 1904     Current Outpatient Medications  Medication Sig Dispense Refill  . capecitabine (XELODA) 500 MG tablet TAKE 3 TABLETS (1500 MG) IN AM AND 2 TABLETS (1000 MG) IN PM-TOTAL 2500 MG DAILY. TAKE ON DAYS 1-7 AND 15-21 OF EACH 28 DAY CYCLE. 70 tablet 5  . DULoxetine (CYMBALTA) 60 MG capsule Take 1 capsule (60 mg total) by mouth every evening. 30 capsule 3  . ferrous sulfate 325 (65 FE) MG EC tablet Take 1 tablet (325 mg total) by mouth daily.  0  . fluticasone (CUTIVATE) 0.05 % cream Apply topically 2 (two) times daily. 30 g 1  . HYDROmorphone (DILAUDID) 4 MG tablet Take 4 mg by mouth every 4 (four) hours as needed for severe pain.     Marland Kitchen lidocaine-prilocaine (EMLA) cream Apply to port site one hour prior to use. Do not rub in. Cover with plastic. 30 g 2  . loperamide (IMODIUM A-D) 2 MG capsule Take by mouth as needed for diarrhea or loose stools.    . methadone (DOLOPHINE) 10 MG tablet Take 10 mg by mouth 3 (three) times daily.     . minocycline (MINOCIN) 100 MG capsule Take 1 capsule (100 mg total) by mouth 2 (two) times daily. 60 capsule 11  .  ondansetron (ZOFRAN) 8 MG tablet Take 1 tablet (8 mg total) by mouth every 8 (eight) hours as needed for nausea or vomiting. 20 tablet 1  . polyethylene glycol (MIRALAX / GLYCOLAX) packet Take 17 g by mouth daily as needed for mild constipation.     . potassium chloride (K-DUR) 10 MEQ tablet Take 1 tablet (10 mEq total) by mouth 2 (two) times daily. 60 tablet 1  . prochlorperazine (COMPAZINE) 10 MG tablet Take 1 tablet (10 mg total) by mouth every 6 (six) hours as needed for nausea or vomiting. 30 tablet 1  . Meth-Hyo-M Bl-Na Phos-Ph Sal (URIBEL) 118 MG CAPS Take 1 capsule (118 mg total) by mouth 3 times/day as needed-between meals & bedtime. (Patient not taking: Reported on 10/11/2017) 120 capsule 3   Facility-Administered Medications Ordered in Other Encounters  Medication Dose Route Frequency Provider Last Rate Last Dose  . alteplase (CATHFLO ACTIVASE) injection 2 mg  2 mg Intracatheter PRN Ladell Pier, MD   2 mg at 10/06/16 1015  . sodium chloride 0.9 % injection 10 mL  10  mL Intravenous PRN Ladell Pier, MD   10 mL at 11/17/16 1455  . sodium chloride 0.9 % injection 10 mL  10 mL Intravenous PRN Ladell Pier, MD   10 mL at 05/25/17 1218    Allergies: No Known Allergies  Social History: Social History   Tobacco Use  . Smoking status: Former Smoker    Packs/day: 0.25    Years: 1.00    Pack years: 0.25    Types: Cigarettes    Last attempt to quit: 10/03/1978    Years since quitting: 39.0  . Smokeless tobacco: Never Used  Substance Use Topics  . Alcohol use: No    Alcohol/week: 0.0 oz  . Drug use: No    Family History Family History  Problem Relation Age of Onset  . Heart disease Mother   . Melanoma Mother   . Colon cancer Neg Hx   . Colon polyps Neg Hx   . Rectal cancer Neg Hx   . Stomach cancer Neg Hx   . Esophageal cancer Neg Hx     Review of Systems 10 systems were reviewed and are negative except as noted specifically in the HPI.  Objective   Vital  signs in last 24 hours: BP (!) 155/100 (BP Location: Right Arm)   Pulse 84   Temp 98 F (36.7 C) Comment: pt drinking water  Resp 16   Ht 5\' 5"  (1.651 m)   Wt 59 kg (130 lb)   SpO2 98%   BMI 21.63 kg/m   Intake/Output last 3 shifts: No intake/output data recorded.  Physical Exam General: NAD, A&O, resting, appropriate HEENT: [place]/AT, EOMI, MMM Pulmonary: Normal work of breathing on room air Cardiovascular: HDS, adequate peripheral perfusion Abdomen: soft, NTTP, nondistended, suprapubic fullness or tenderness GU: right CVA tenderness Extremities: warm and well perfused, no edema Neuro: Appropriate, no focal neurological deficits  Most Recent Labs: Lab Results  Component Value Date   WBC 8.6 10/11/2017   HGB 14.0 10/11/2017   HCT 43.3 10/11/2017   PLT 415 (H) 10/11/2017    Lab Results  Component Value Date   NA 136 10/11/2017   K 3.9 10/11/2017   CL 102 10/11/2017   CO2 26 10/11/2017   BUN 9 10/11/2017   CREATININE 0.48 10/11/2017   CALCIUM 9.0 10/11/2017   MG 2.1 09/21/2017    Lab Results  Component Value Date   ALKPHOS 105 09/21/2017   BILITOT 0.41 09/21/2017   PROT 6.2 (L) 09/21/2017   ALBUMIN 3.2 (L) 09/21/2017   ALT 12 09/21/2017   AST 19 09/21/2017    Lab Results  Component Value Date   INR 1.10 04/10/2016   APTT 33 04/10/2016     Urine Culture: Pending  Status:  Final result  Visible to patient:  No (Not Released)  Next appt:  10/12/2017 at 09:45 AM in Oncology [facility] 2)   Ref Range & Units 17:20  Color, Urine YELLOW YELLOW   APPearance CLEAR CLEAR   Specific Gravity, Urine 1.005 - 1.030 1.009   pH 5.0 - 8.0 6.0   Glucose, UA NEGATIVE mg/dL 50 Abnormal    Hgb urine dipstick NEGATIVE MODERATE Abnormal    Bilirubin Urine NEGATIVE NEGATIVE   Ketones, ur NEGATIVE mg/dL 5 Abnormal    Protein, ur NEGATIVE mg/dL NEGATIVE   Nitrite NEGATIVE NEGATIVE   Leukocytes, UA NEGATIVE MODERATE Abnormal    RBC / HPF 0 - 5 RBC/hpf 6-30   WBC,  UA 0 - 5 WBC/hpf 6-30  Bacteria, UA NONE SEEN RARE Abnormal    Squamous Epithelial / LPF NONE SEEN NONE SEEN         IMAGING: Ct Renal Stone Study  Result Date: 10/11/2017 CLINICAL DATA:  Right flank pain. History of nephrolithiasis. History of metastatic colorectal cancer with peritoneal carcinomatosis and liver metastases. EXAM: CT ABDOMEN AND PELVIS WITHOUT CONTRAST TECHNIQUE: Multidetector CT imaging of the abdomen and pelvis was performed following the standard protocol without IV contrast. COMPARISON:  10/08/2017. FINDINGS: Lower chest: Clear lung bases. Hepatobiliary: Stable small right lobe liver cyst. Normal appearing gallbladder. Pancreas: Unremarkable. No pancreatic ductal dilatation or surrounding inflammatory changes. Spleen: Normal in size without focal abnormality. Adrenals/Urinary Tract: Normal appearing adrenal glands. Interval moderate dilatation of the right renal collecting system and right ureter to the level of the previously demonstrated large cystic and solid pelvic mass. Mildly improved dilatation of the upper pole moiety of the left renal collecting system with a stable double-J ureteral stent extending into the region of the posterior aspect of a poorly distended urinary bladder on the left. No urinary tract calculi are seen. Stomach/Bowel: Moderate-sized hiatal hernia. Unremarkable small bowel. Stool and barium in the colon with a left lower quadrant colostomy again demonstrated. Vascular/Lymphatic: Minimal atheromatous arterial calcifications without aneurysm. Stable mildly prominent bilateral inguinal lymph nodes, including a 9 mm short axis left inguinal node on image number 76 of series 2 and an 8 mm short axis right inguinal node on image number 83 of series 2. Reproductive: The previously demonstrated 13.1 x 13.0 cm cystic and solid central pelvic mass currently measures 13.3 x 12.9 cm on image number 64 series 2. This measures 11.7 cm in length on coronal image number  62, previously 10.9 cm. Surgically absent uterus. Other: No free peritoneal fluid. Musculoskeletal: L5-S1 interbody and pedicle screw and rod fusion. Mild lumbar and lower thoracic spine degenerative changes and mild to moderate scoliosis. Mild right hip degenerative changes. IMPRESSION: 1. Interval moderate right hydronephrosis and hydroureter, most likely due to distal ureteral obstruction by the large cystic and solid pelvic mass. 2. Mildly improved left upper pole hydronephrosis with a ureteral stent in place. 3. No significant change in a large cystic and solid pelvic mass compatible with a primary ovarian malignancy or ovarian metastasis. 4. No significant change in mildly prominent bilateral inguinal lymph nodes. 5. Moderate-sized hiatal hernia. Electronically Signed   By: Claudie Revering M.D.   On: 10/11/2017 18:16    ------  Assessment:  Patient is a 62 y.o. female with history of metastatic colon cancer on immuno/chemotherapy with an enlarging pelvic cystic mass encompassing most of her pelvis that is causing new onset right hydroureteronephrosis to the level of the mass and likely right flank pain.   I discussed option with the patient and her husband. I explained the imaging findings, though I did not discuss that this mass may be a new primary malignancy. I discussed the option to control her flank pain with pain medicine, likely NSAIDs and discharge home, or to be admitted for ureteral stent placement versus nephrostomy tube placement on the right side. I cited the high likelihood of failure rate of ureteral stents for extrinsic compression. However if she needed right collecting system decompression she would prefer to attempt ureteral stent placement before nephrostomy tube placement.   Given patient's creatinine is wnl, she is without evidence of infection and a UA that is borderline but not overly concerning for infection, attempting symptom control would be appropriate, which is what the  patient would prefer to do. She has an appointment this Thursday with Dr. Gilford Rile. In the event that her symptoms are able to be controlled on ibuprofen until the date of her next stent exchange, I would ask Dr. Gilford Rile to place a right ureteral stent at the time of her left ureteral stent exchange surgery.  If patients pain is unable to be controlled, recommend internal medicine admission for chemotherapy in the AM followed by ureteral stent placement in the PM.   Recommendations: 1. Toradol 15mg  IV x1 2. If pain poorly controlled after attempting therapy with NSAIDs, recommend medical admission and NPO at midnight for cystourethroscopy, right ureteral stent placement and left ureteral stent exchange tomorrow.  3. If admitted, would alert medical oncology regarding her admission so that she is able to get her chemotherapy as scheduled if appropriate   Thank you for this consult. Please contact the urology consult pager with any further questions/concerns.  Jonna Clark, MD Urology Surgical Resident  -----

## 2017-10-11 NOTE — ED Notes (Signed)
ED Provider at bedside. 

## 2017-10-11 NOTE — ED Notes (Signed)
Urologist at bedside.

## 2017-10-11 NOTE — Discharge Instructions (Signed)
Follow-up as you have been instructed

## 2017-10-12 ENCOUNTER — Inpatient Hospital Stay: Payer: BLUE CROSS/BLUE SHIELD

## 2017-10-12 ENCOUNTER — Encounter: Payer: Self-pay | Admitting: Oncology

## 2017-10-12 ENCOUNTER — Other Ambulatory Visit: Payer: Self-pay

## 2017-10-12 ENCOUNTER — Encounter (HOSPITAL_BASED_OUTPATIENT_CLINIC_OR_DEPARTMENT_OTHER): Payer: Self-pay | Admitting: *Deleted

## 2017-10-12 ENCOUNTER — Telehealth: Payer: Self-pay | Admitting: Nurse Practitioner

## 2017-10-12 ENCOUNTER — Inpatient Hospital Stay: Payer: BLUE CROSS/BLUE SHIELD | Attending: Oncology | Admitting: Oncology

## 2017-10-12 ENCOUNTER — Other Ambulatory Visit: Payer: Self-pay | Admitting: Urology

## 2017-10-12 VITALS — BP 158/87 | HR 83 | Temp 98.6°F | Resp 18 | Ht 65.0 in | Wt 132.9 lb

## 2017-10-12 DIAGNOSIS — Z95828 Presence of other vascular implants and grafts: Secondary | ICD-10-CM

## 2017-10-12 DIAGNOSIS — N134 Hydroureter: Secondary | ICD-10-CM

## 2017-10-12 DIAGNOSIS — C19 Malignant neoplasm of rectosigmoid junction: Secondary | ICD-10-CM

## 2017-10-12 DIAGNOSIS — C787 Secondary malignant neoplasm of liver and intrahepatic bile duct: Secondary | ICD-10-CM | POA: Diagnosis not present

## 2017-10-12 DIAGNOSIS — M549 Dorsalgia, unspecified: Secondary | ICD-10-CM | POA: Diagnosis not present

## 2017-10-12 DIAGNOSIS — R600 Localized edema: Secondary | ICD-10-CM | POA: Insufficient documentation

## 2017-10-12 DIAGNOSIS — R1907 Generalized intra-abdominal and pelvic swelling, mass and lump: Secondary | ICD-10-CM | POA: Diagnosis not present

## 2017-10-12 DIAGNOSIS — M79662 Pain in left lower leg: Secondary | ICD-10-CM | POA: Insufficient documentation

## 2017-10-12 DIAGNOSIS — C20 Malignant neoplasm of rectum: Secondary | ICD-10-CM | POA: Diagnosis not present

## 2017-10-12 DIAGNOSIS — R109 Unspecified abdominal pain: Secondary | ICD-10-CM | POA: Diagnosis not present

## 2017-10-12 DIAGNOSIS — N1330 Unspecified hydronephrosis: Secondary | ICD-10-CM | POA: Diagnosis not present

## 2017-10-12 DIAGNOSIS — D509 Iron deficiency anemia, unspecified: Secondary | ICD-10-CM

## 2017-10-12 DIAGNOSIS — Z5111 Encounter for antineoplastic chemotherapy: Secondary | ICD-10-CM | POA: Diagnosis present

## 2017-10-12 DIAGNOSIS — C786 Secondary malignant neoplasm of retroperitoneum and peritoneum: Secondary | ICD-10-CM | POA: Diagnosis not present

## 2017-10-12 LAB — CBC WITH DIFFERENTIAL/PLATELET
BASOS ABS: 0 10*3/uL (ref 0.0–0.1)
Basophils Relative: 0 %
Eosinophils Absolute: 0.2 10*3/uL (ref 0.0–0.5)
Eosinophils Relative: 2 %
HEMATOCRIT: 43.1 % (ref 34.8–46.6)
Hemoglobin: 14.1 g/dL (ref 11.6–15.9)
LYMPHS PCT: 6 %
Lymphs Abs: 0.5 10*3/uL — ABNORMAL LOW (ref 0.9–3.3)
MCH: 29.3 pg (ref 25.1–34.0)
MCHC: 32.8 g/dL (ref 31.5–36.0)
MCV: 89.5 fL (ref 79.5–101.0)
MONO ABS: 0.7 10*3/uL (ref 0.1–0.9)
MONOS PCT: 8 %
NEUTROS ABS: 7.4 10*3/uL — AB (ref 1.5–6.5)
Neutrophils Relative %: 84 %
Platelets: 430 10*3/uL — ABNORMAL HIGH (ref 145–400)
RBC: 4.82 MIL/uL (ref 3.70–5.45)
RDW: 17.6 % — AB (ref 11.2–16.1)
WBC: 8.8 10*3/uL (ref 3.9–10.3)

## 2017-10-12 LAB — COMPREHENSIVE METABOLIC PANEL
ALBUMIN: 3.3 g/dL — AB (ref 3.5–5.0)
ALT: 14 U/L (ref 0–55)
ANION GAP: 8 (ref 3–11)
AST: 21 U/L (ref 5–34)
Alkaline Phosphatase: 130 U/L (ref 40–150)
BUN: 10 mg/dL (ref 7–26)
CO2: 27 mmol/L (ref 22–29)
Calcium: 8.9 mg/dL (ref 8.4–10.4)
Chloride: 102 mmol/L (ref 98–109)
Creatinine, Ser: 0.75 mg/dL (ref 0.60–1.10)
GFR calc Af Amer: >60 mL/min (ref >60)
GFR calc non Af Amer: >60 mL/min (ref >60)
GLUCOSE: 99 mg/dL (ref 70–140)
POTASSIUM: 3.9 mmol/L (ref 3.3–4.7)
SODIUM: 137 mmol/L (ref 136–145)
Total Bilirubin: 0.7 mg/dL (ref 0.2–1.2)
Total Protein: 6.5 g/dL (ref 6.4–8.3)

## 2017-10-12 LAB — CEA (IN HOUSE-CHCC): CEA (CHCC-IN HOUSE): 1.32 ng/mL (ref 0.00–5.00)

## 2017-10-12 LAB — MAGNESIUM: Magnesium: 2.1 mg/dL (ref 1.5–2.5)

## 2017-10-12 MED ORDER — SODIUM CHLORIDE 0.9 % IJ SOLN
10.0000 mL | INTRAMUSCULAR | Status: DC | PRN
  Administered 2017-10-12: 10 mL via INTRAVENOUS
  Filled 2017-10-12: qty 10

## 2017-10-12 MED ORDER — HEPARIN SOD (PORK) LOCK FLUSH 100 UNIT/ML IV SOLN
500.0000 [IU] | Freq: Once | INTRAVENOUS | Status: AC | PRN
  Administered 2017-10-12: 500 [IU] via INTRAVENOUS
  Filled 2017-10-12: qty 5

## 2017-10-12 MED ORDER — KETOROLAC TROMETHAMINE 10 MG PO TABS: 10.0000 mg | ORAL_TABLET | Freq: Three times a day (TID) | ORAL | 0 refills | Status: DC

## 2017-10-12 NOTE — Progress Notes (Signed)
Carolyn Barton OFFICE PROGRESS NOTE   Diagnosis: Rectal cancer  INTERVAL HISTORY:   Carolyn Barton returns as scheduled.  She continues treatment with Xeloda/Panitumumab.  She reports the acute onset of right low back pain yesterday.  The pain was not relieved with methadone or hydromorphone. She was seen in the emergency room yesterday.  She was treated with IV hydromorphone and Toradol.  She reports the pain was relieved after she received the dose of Toradol.  A CT of the abdomen and pelvis 10/08/2017 revealed enlargement of a cystic pelvic mass with mass-effect on the bladder.  This was suspected to represent a left ovarian mass.  The left ureter stent was noted with left hydronephrosis.  A CT of the abdomen, renal protocol, yesterday revealed revealed development of right hydronephrosis and hydroureter. She was evaluated by urology in the emergency room.  She is scheduled to see Dr. Lovena Neighbours on 10/15/2017. Objective:  Vital signs in last 24 hours:  Blood pressure (!) 158/87, pulse 83, temperature 98.6 F (37 C), temperature source Oral, resp. rate 18, height '5\' 5"'$  (1.651 m), weight 132 lb 14.4 oz (60.3 kg).    HEENT: No thrush Resp: Lungs clear bilaterally Cardio: Regular rate and rhythm GI: Left lower quadrant colostomy, no hepatomegaly, no mass, nontender Vascular: Trace foot and ankle edema bilaterally Musculoskeletal: No tenderness at the right flank  Portacath/PICC-without erythema  Lab Results:  Lab Results  Component Value Date   WBC 8.8 10/12/2017   HGB 14.1 10/12/2017   HCT 43.1 10/12/2017   MCV 89.5 10/12/2017   PLT 430 (H) 10/12/2017   NEUTROABS 7.4 (H) 10/12/2017    CMP     Component Value Date/Time   NA 137 10/12/2017 0957   NA 139 09/21/2017 0746   K 3.9 10/12/2017 0957   K 3.3 (L) 09/21/2017 0746   CL 102 10/12/2017 0957   CL 101 12/01/2012 0924   CO2 27 10/12/2017 0957   CO2 28 09/21/2017 0746   GLUCOSE 99 10/12/2017 0957   GLUCOSE 92  09/21/2017 0746   GLUCOSE 94 12/01/2012 0924   BUN 10 10/12/2017 0957   BUN 9.3 09/21/2017 0746   CREATININE 0.75 10/12/2017 0957   CREATININE 0.6 09/21/2017 0746   CALCIUM 8.9 10/12/2017 0957   CALCIUM 8.7 09/21/2017 0746   PROT 6.5 10/12/2017 0957   PROT 6.2 (L) 09/21/2017 0746   ALBUMIN 3.3 (L) 10/12/2017 0957   ALBUMIN 3.2 (L) 09/21/2017 0746   AST 21 10/12/2017 0957   AST 19 09/21/2017 0746   ALT 14 10/12/2017 0957   ALT 12 09/21/2017 0746   ALKPHOS 130 10/12/2017 0957   ALKPHOS 105 09/21/2017 0746   BILITOT 0.7 10/12/2017 0957   BILITOT 0.41 09/21/2017 0746   GFRNONAA >60 10/12/2017 0957   GFRAA >60 10/12/2017 0957    Lab Results  Component Value Date   CEA1 1.32 10/12/2017     Imaging:  Ct Renal Stone Study  Result Date: 10/11/2017 CLINICAL DATA:  Right flank pain. History of nephrolithiasis. History of metastatic colorectal cancer with peritoneal carcinomatosis and liver metastases. EXAM: CT ABDOMEN AND PELVIS WITHOUT CONTRAST TECHNIQUE: Multidetector CT imaging of the abdomen and pelvis was performed following the standard protocol without IV contrast. COMPARISON:  10/08/2017. FINDINGS: Lower chest: Clear lung bases. Hepatobiliary: Stable small right lobe liver cyst. Normal appearing gallbladder. Pancreas: Unremarkable. No pancreatic ductal dilatation or surrounding inflammatory changes. Spleen: Normal in size without focal abnormality. Adrenals/Urinary Tract: Normal appearing adrenal glands. Interval moderate dilatation  of the right renal collecting system and right ureter to the level of the previously demonstrated large cystic and solid pelvic mass. Mildly improved dilatation of the upper pole moiety of the left renal collecting system with a stable double-J ureteral stent extending into the region of the posterior aspect of a poorly distended urinary bladder on the left. No urinary tract calculi are seen. Stomach/Bowel: Moderate-sized hiatal hernia. Unremarkable small  bowel. Stool and barium in the colon with a left lower quadrant colostomy again demonstrated. Vascular/Lymphatic: Minimal atheromatous arterial calcifications without aneurysm. Stable mildly prominent bilateral inguinal lymph nodes, including a 9 mm short axis left inguinal node on image number 76 of series 2 and an 8 mm short axis right inguinal node on image number 83 of series 2. Reproductive: The previously demonstrated 13.1 x 13.0 cm cystic and solid central pelvic mass currently measures 13.3 x 12.9 cm on image number 64 series 2. This measures 11.7 cm in length on coronal image number 62, previously 10.9 cm. Surgically absent uterus. Other: No free peritoneal fluid. Musculoskeletal: L5-S1 interbody and pedicle screw and rod fusion. Mild lumbar and lower thoracic spine degenerative changes and mild to moderate scoliosis. Mild right hip degenerative changes. IMPRESSION: 1. Interval moderate right hydronephrosis and hydroureter, most likely due to distal ureteral obstruction by the large cystic and solid pelvic mass. 2. Mildly improved left upper pole hydronephrosis with a ureteral stent in place. 3. No significant change in a large cystic and solid pelvic mass compatible with a primary ovarian malignancy or ovarian metastasis. 4. No significant change in mildly prominent bilateral inguinal lymph nodes. 5. Moderate-sized hiatal hernia. Electronically Signed   By: Claudie Revering M.D.   On: 10/11/2017 18:16    Medications: I have reviewed the patient's current medications.   Assessment/Plan: 1.Metastatic colorectal cancer-status post an exploratory laparotomy 03/03/2016 revealing a proximal rectal mass, peritoneal carcinomatosis, and liver metastases  Biopsy of peritoneal nodules 03/03/2016 confirmed metastatic adenocarcinoma consistent with a colon primary  Foundation 1 testing-MSI-stable, tumor mutation burden-low, no BRAF NRAS or KRAS mutation  Cycle 1 FOLFIRI/PANITUMUMAB 04/14/2016  Cycle 2  FOLFIRI/panitumumab 04/28/2016  Cycle 3 FOLFIRI/panitumumab 05/12/2016  Cycle 4 FOLFIRI/panitumumab 05/27/2016  Cycle 5 FOLFIRI/panitumumab 06/09/2016  Restaging CT abdomen/pelvis 06/20/2016-no evidence of disease progression, decreased left hepatic lesion  Cycle 6 FOLFIRI/panitumumab 06/23/2016  Cycle 7 FOLFIRI/panitumumab 07/07/2016  Cycle 8 FOLFIRI/panitumumab 07/21/2016  Cycle 9 FOLFIRI/PANITUMUMAB 08/04/2016  Cycle 10 FOLFIRI/panitumumab 08/18/2016  Restaging CT 08/29/2016 with interval decrease in the size of the medial segment left liver lesion. Lesion identified previously in the rectosigmoid colon not evident on the current study.  5-FU/PANITUMUMAB 09/01/2016  Xeloda 7 days on/7 days off and PANITUMUMAB every 3 weeks 09/15/2016 (awaiting insurance approval for Xeloda 09/15/2016)  CT abdomen/pelvis 12/25/2016-unchanged 5 mm right hepatic lesion, resolution of medial segment left liver lesion, no new liver lesion. Residual soft tissue fullness in the sigmoid , persistent distal esophageal wall thickening  Continuationof PANITUMUMAB every 3 weeks and Xeloda 7 days on/7 days off  CT 06/19/2017-new cystic/solid lesion in the left adnexa  Xeloda/panitumumab continued  CT 10/08/2016-progression of cystic pelvic mass  2.History of aBowel obstruction secondary to #1  3. Chronic back pain  4. Essential thrombocytosis  5. Early obstruction of the left ureter noted at the time of surgery 03/03/2016-Dr. Tannenbaum placed a left double-J stent 03/18/2016  New onset right hydronephrosis 10/11/2017  6. Abdominal wall cellulitis 03/10/2016, blood cultures positive for coagulase negative staphylococcus-methicillin-resistant, status post treatment with vancomycin and Bactrim  7. Tachycardia-persistent following discharge from the hospital, likely related to anemia and deconditioning  CT chest 04/04/2016-negative for pulmonary embolism.  8. Port-A-Cath placement  04/10/2016, interventional radiology  9. Iron deficiency anemia. Feraheme 11/05/2015 , 11/12/2015,and 04/14/2016-persistent anemia and red cell microcytosis, oral iron resumed 11/17/2016-stable  10. Pain/tenderness left posterior iliac region-resolved  11. Hypokalemia-started on potassium replacement 06/09/2016  12. Skin rashand paronychiasecondary to [facility], she continues minocycline, moisturizers, and fluticasone  13.  Acute onset right low back pain 10/11/2017-likely secondary to obstruction of the right kidney   [name] has metastatic colon cancer.  She is currently being treated with Xeloda/Panitumumab.  The restaging CT reveals progression of the cystic pelvic mass.  I suspect this is a metastasis to the ovary.  I reviewed the CT images and discussed treatment options with 6 and her husband.  She has new onset pain in the right flank, likely secondary to new right hydronephrosis.  I discussed the case with Dr. Lovena Neighbours.  He will see her today with the plan to place a right ureter stent and exchange the left ureter stent.  I gave her a prescription for Toradol to use as needed for pain.  She will also try Dilaudid as needed.  Ms. Beringer will return for an office visit and further discussion on 10/19/2017.  We will consider repeat treatment with FOLFIRI/Panitumumab versus switching to an oxaliplatin based regimen.  I doubt the pelvic mass will be resectable, but I will ask GYN oncology to review the CT images.  40 minutes were spent with the patient today.  The majority of the time was used for counseling and coordination of care.  [name] Coder, MD  10/12/2017  2:14 PM

## 2017-10-12 NOTE — Telephone Encounter (Signed)
Gave avs and calendar for january °

## 2017-10-12 NOTE — H&P (Signed)
Urology Preoperative H&P   Chief Complaint: Right flank pain  History of Present Illness: Carolyn Barton is a 62 y.o. female with a history of metastatic colon cancer causing extrinsic compression of her left ureter that has required an indwelling left JJ stent (last placement 03/16/17). She was evaluated over the weekend for acute right flank pain and had a CT stone study showing right sided hydronephrosis 2/2 to enlargement of a pelvic mass. Her left stent appears to be in good position. She was given IV Toradol in the emergency department the same to alleviate her pain. Creatinine was within normal limits. She was seen this morning by Dr. [name] Coder with medical oncology and was given an a prescription for PO Toradol to help manage her pain. Currently, she denies fever/chills, nausea/vomiting, dysuria or gross hematuria.     Past Medical History:  Diagnosis Date  . Chronic back pain    lumbar    . Colostomy in place [facility])   . 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 History of chemotherapy last chemo 09-21-17  . Hypokalemia    takes potassium  . Iron deficiency anemia 04/14/2016   treated w/ Iron infusions  . Liver metastasis ([place])    secondary to colorectal cancer  . Metastatic colorectal cancer [facility]) dx 03/03/2016--- oncologist-- dr Benay Spice   metastatic colorectal carcinoma--  s/p  exp. lap. for bowel obstruction--  rectal mass, peritoneal carcinomatosis, liver mets---  chemotherapy  . Spinal headache 12/21/2012  . Tachycardia    persistant since discharged from hospital 06/ 2017 due to anemia and deconditioning;  as of 09-02-2016 per pt no issues w/ heart racing in the past few weeks  . Ureteral obstruction, left   . Wears contact lenses     Past Surgical History:  Procedure Laterality Date  . BIOPSY N/A 03/03/2016   Procedure: BIOPSY OF PERITONEAL NODULE;  Surgeon: Erroll Luna, MD;  Location: [place];  Service: General;  Laterality: N/A;  . COLOSTOMY N/A  03/03/2016   Procedure: DIVERTING SIGMOID COLOSTOMY;  Surgeon: Erroll Luna, MD;  Location: [place];  Service: General;  Laterality: N/A;  . CYSTOSCOPY W/ URETERAL STENT PLACEMENT Left 10/09/2016   Procedure: CYSTOSCOPY WITH LEFT STENT REPLACEMENT 43F POLARIS STENT 24CM;  Surgeon: Carolan Clines, MD;  Location: [facility];  Service: Urology;  Laterality: Left;  . CYSTOSCOPY W/ URETERAL STENT PLACEMENT Left 03/16/2017   Procedure: CYSTOSCOPY WITH RETROGRADE PYELOGRAM WITH LEFT  STENT REMOVAL AND REPLACEMENT;  Surgeon: Carolan Clines, MD;  Location: [facility];  Service: Urology;  Laterality: Left;  . CYSTOSCOPY WITH RETROGRADE PYELOGRAM, URETEROSCOPY AND STENT PLACEMENT Left 03/18/2016   Procedure: CYSTOSCOPY WITH LEFT  RETROGRADE PYELOGRAM,  AND  POLARIS STENT PLACEMENT;  Surgeon: Carolan Clines, MD;  Location: [facility];  Service: Urology;  Laterality: Left;  . LAPAROTOMY N/A 03/03/2016   Procedure: EXPLORATORY LAPAROTOMY;  Surgeon: Erroll Luna, MD;  Location: [place];  Service: General;  Laterality: N/A;  . LUMBAR LAMINECTOMY  11/11/2012   L4-5  and Resection synovial cyst  . MAXIMUM ACCESS (MAS)POSTERIOR LUMBAR INTERBODY FUSION (PLIF) 1 LEVEL N/A 01/11/2014   Procedure: FOR MAXIMUM ACCESS (MAS) POSTERIOR LUMBAR INTERBODY FUSION Lumbar Five Sacral One;  Surgeon: [name], MD;  Location: [place] [facility];  Service: Neurosurgery;  Laterality: N/A;  FOR MAXIMUM ACCESS (MAS) POSTERIOR LUMBAR INTERBODY FUSION Lumbar Five Sacral One  . PORTACATH PLACEMENT  04/10/2016  . REPAIR CEREBROSPINAL FLUID LEAK POST RESECTION SYNOVIAL CYST  12/21/2012  . REVISION LUMBAR HARDWARE  01/26/2014  . TRANSTHORACIC ECHOCARDIOGRAM  06/17/2016   grade 1 diastolic function , ef 26-94%/  trivial TR  . WISDOM TOOTH EXTRACTION      Allergies: No Known Allergies  Family History  Problem Relation Age of Onset  . Heart disease Mother   . Melanoma Mother   . Colon cancer Neg Hx   .  Colon polyps Neg Hx   . Rectal cancer Neg Hx   . Stomach cancer Neg Hx   . Esophageal cancer Neg Hx     Social History:  reports that she quit smoking about 39 years ago. Her smoking use included cigarettes. She has a 0.25 pack-year smoking history. she has never used smokeless tobacco. She reports that she does not drink alcohol or use drugs.  ROS: A complete review of systems was performed.  All systems are negative except for pertinent findings as noted.  Physical Exam:  Vital signs in last 24 hours: Temp:  [98 F (36.7 C)-98.6 F (37 C)] 98.6 F (37 C) (01/14 1020) Pulse Rate:  [78-84] 83 (01/14 1020) Resp:  [16-20] 18 (01/14 1020) BP: (153-158)/(87-100) 158/87 (01/14 1020) SpO2:  [98 %-100 %] 98 % (01/13 2039) Weight:  [59 kg (130 lb)-60.3 kg (132 lb 14.4 oz)] 59 kg (130 lb) (01/14 1454) Constitutional:  Alert and oriented, No acute distress Cardiovascular: Regular rate and rhythm, No JVD Respiratory: Normal respiratory effort, Lungs clear bilaterally GI: Abdomen is soft, nontender, nondistended, no abdominal masses GU: No CVA tenderness Lymphatic: No lymphadenopathy Neurologic: Grossly intact, no focal deficits Psychiatric: Normal mood and affect  Laboratory Data:  Recent Labs    10/11/17 1826 10/12/17 0957  WBC 8.6 8.8  HGB 14.0 14.1  HCT 43.3 43.1  PLT 415* 430*    Recent Labs    10/11/17 1826 10/12/17 0957  NA 136 137  K 3.9 3.9  CL 102 102  GLUCOSE 101* 99  BUN 9 10  CALCIUM 9.0 8.9  CREATININE 0.48 0.75     Results for orders placed or performed in visit on 10/12/17 (from the past 24 hour(s))  Magnesium     Status: None   Collection Time: 10/12/17  9:57 AM  Result Value Ref Range   Magnesium 2.1 1.5 - 2.5 mg/dL  Comprehensive metabolic panel     Status: Abnormal   Collection Time: 10/12/17  9:57 AM  Result Value Ref Range   Sodium 137 136 - 145 mmol/L   Potassium 3.9 3.3 - 4.7 mmol/L   Chloride 102 98 - 109 mmol/L   CO2 27 22 - 29 mmol/L    Glucose, Bld 99 70 - 140 mg/dL   BUN 10 7 - 26 mg/dL   Creatinine, Ser 0.75 0.60 - 1.10 mg/dL   Calcium 8.9 8.4 - 10.4 mg/dL   Total Protein 6.5 6.4 - 8.3 g/dL   Albumin 3.3 (L) 3.5 - 5.0 g/dL   AST 21 5 - 34 U/L   ALT 14 0 - 55 U/L   Alkaline Phosphatase 130 40 - 150 U/L   Total Bilirubin 0.7 0.2 - 1.2 mg/dL   GFR calc non Af Amer >60 >60 mL/min   GFR calc Af Amer >60 >60 mL/min   Anion gap 8 3 - 11  CBC with Differential     Status: Abnormal   Collection Time: 10/12/17  9:57 AM  Result Value Ref Range   WBC 8.8 3.9 - 10.3 K/uL   RBC 4.82 3.70 -  5.45 MIL/uL   Hemoglobin 14.1 11.6 - 15.9 g/dL   HCT 43.1 34.8 - 46.6 %   MCV 89.5 79.5 - 101.0 fL   MCH 29.3 25.1 - 34.0 pg   MCHC 32.8 31.5 - 36.0 g/dL   RDW 17.6 (H) 11.2 - 16.1 %   Platelets 430 (H) 145 - 400 K/uL   Neutrophils Relative % 84 %   Neutro Abs 7.4 (H) 1.5 - 6.5 K/uL   Lymphocytes Relative 6 %   Lymphs Abs 0.5 (L) 0.9 - 3.3 K/uL   Monocytes Relative 8 %   Monocytes Absolute 0.7 0.1 - 0.9 K/uL   Eosinophils Relative 2 %   Eosinophils Absolute 0.2 0.0 - 0.5 K/uL   Basophils Relative 0 %   Basophils Absolute 0.0 0.0 - 0.1 K/uL   No results found for this or any previous visit (from the past 240 hour(s)).  Renal Function: Recent Labs    10/11/17 1826 10/12/17 0957  CREATININE 0.48 0.75   Estimated Creatinine Clearance: 67.3 mL/min (by C-G formula based on SCr of 0.75 mg/dL).  Radiologic Imaging: Ct Renal Stone Study  Result Date: 10/11/2017 CLINICAL DATA:  Right flank pain. History of nephrolithiasis. History of metastatic colorectal cancer with peritoneal carcinomatosis and liver metastases. EXAM: CT ABDOMEN AND PELVIS WITHOUT CONTRAST TECHNIQUE: Multidetector CT imaging of the abdomen and pelvis was performed following the standard protocol without IV contrast. COMPARISON:  10/08/2017. FINDINGS: Lower chest: Clear lung bases. Hepatobiliary: Stable small right lobe liver cyst. Normal appearing gallbladder.  Pancreas: Unremarkable. No pancreatic ductal dilatation or surrounding inflammatory changes. Spleen: Normal in size without focal abnormality. Adrenals/Urinary Tract: Normal appearing adrenal glands. Interval moderate dilatation of the right renal collecting system and right ureter to the level of the previously demonstrated large cystic and solid pelvic mass. Mildly improved dilatation of the upper pole moiety of the left renal collecting system with a stable double-J ureteral stent extending into the region of the posterior aspect of a poorly distended urinary bladder on the left. No urinary tract calculi are seen. Stomach/Bowel: Moderate-sized hiatal hernia. Unremarkable small bowel. Stool and barium in the colon with a left lower quadrant colostomy again demonstrated. Vascular/Lymphatic: Minimal atheromatous arterial calcifications without aneurysm. Stable mildly prominent bilateral inguinal lymph nodes, including a 9 mm short axis left inguinal node on image number 76 of series 2 and an 8 mm short axis right inguinal node on image number 83 of series 2. Reproductive: The previously demonstrated 13.1 x 13.0 cm cystic and solid central pelvic mass currently measures 13.3 x 12.9 cm on image number 64 series 2. This measures 11.7 cm in length on coronal image number 62, previously 10.9 cm. Surgically absent uterus. Other: No free peritoneal fluid. Musculoskeletal: L5-S1 interbody and pedicle screw and rod fusion. Mild lumbar and lower thoracic spine degenerative changes and mild to moderate scoliosis. Mild right hip degenerative changes. IMPRESSION: 1. Interval moderate right hydronephrosis and hydroureter, most likely due to distal ureteral obstruction by the large cystic and solid pelvic mass. 2. Mildly improved left upper pole hydronephrosis with a ureteral stent in place. 3. No significant change in a large cystic and solid pelvic mass compatible with a primary ovarian malignancy or ovarian metastasis. 4. No  significant change in mildly prominent bilateral inguinal lymph nodes. 5. Moderate-sized hiatal hernia. Electronically Signed   By: Claudie Revering M.D.   On: 10/11/2017 18:16    I independently reviewed the above imaging studies.  Assessment and Plan LORRIANN HANSMANN is  a 62 y.o. female with metastatic colon cancer resulting in bilateral extrinsic compression of the ureters and bilateral hydronephrosis.    -The risks, benefits and alternatives of cystoscopy with left JJ stent exchange and right JJ stent placement was discussed with the patient. Risks include, but are not limited to, bleeding, urinary tract infection, ureteral stricture disease, inability to place a JJ stent in a retrograde fashion requiring percutaneous nephrostomy tube placement, chronic pain and the inherent risks of general anesthesia. She voices understanding and wishes to proceed.  Ellison Hughs, MD 10/12/2017, 3:31 PM  [facility] Pager: [phone]

## 2017-10-12 NOTE — Anesthesia Preprocedure Evaluation (Addendum)
Anesthesia Evaluation  Patient identified by MRN, date of birth, ID band Patient awake    Reviewed: Allergy & Precautions, H&P , NPO status , Patient's Chart, lab work & pertinent test results, reviewed documented beta blocker date and time   History of Anesthesia Complications (+) PONV and history of anesthetic complications  Airway Mallampati: II  TM Distance: >3 FB Neck ROM: full    Dental no notable dental hx. (+) Teeth Intact, Dental Advisory Given   Pulmonary former smoker,    Pulmonary exam normal breath sounds clear to auscultation       Cardiovascular Exercise Tolerance: Good  Rhythm:regular Rate:Normal  05/2016 Echo: Impressions:  - Normal LV systolic function; grade 1 diastolic dysfunction;   global longitudinal strain -17.1%.   Neuro/Psych  Neuromuscular disease negative psych ROS   GI/Hepatic Neg liver ROS, hiatal hernia, Liver metastasis  Metastatic colon ca   Endo/Other  negative endocrine ROS  Renal/GU negative Renal ROS     Musculoskeletal Hx of left leg swelling: evaluated by oncologist recently.   Abdominal   Peds  Hematology  (+) anemia ,   Anesthesia Other Findings   Reproductive/Obstetrics                            Anesthesia Physical  Anesthesia Plan  ASA: III  Anesthesia Plan: General   Post-op Pain Management:    Induction: Intravenous  PONV Risk Score and Plan: 3 and Ondansetron, Dexamethasone, Treatment may vary due to age or medical condition and Diphenhydramine  Airway Management Planned: LMA  Additional Equipment: None  Intra-op Plan:   Post-operative Plan: Extubation in OR  Informed Consent: I have reviewed the patients History and Physical, chart, labs and discussed the procedure including the risks, benefits and alternatives for the proposed anesthesia with the patient or authorized representative who has indicated his/her understanding  and acceptance.   Dental advisory given  Plan Discussed with: CRNA and Surgeon  Anesthesia Plan Comments:        Anesthesia Quick Evaluation

## 2017-10-12 NOTE — Progress Notes (Signed)
Npo after midnight arrive 630 am 10-13-17 [facility] take hydromorphone prn and methodone sip of water in am, labs done 10-12-17 cea, cbc with dif, , mag, echo 06-17-16 epic and all results on chart spouse lawrence Sarabia driver.

## 2017-10-13 ENCOUNTER — Ambulatory Visit (HOSPITAL_BASED_OUTPATIENT_CLINIC_OR_DEPARTMENT_OTHER): Payer: BLUE CROSS/BLUE SHIELD | Admitting: Anesthesiology

## 2017-10-13 ENCOUNTER — Encounter (HOSPITAL_BASED_OUTPATIENT_CLINIC_OR_DEPARTMENT_OTHER)
Admission: RE | Disposition: A | Discharge status: Home / Self Care | Payer: Self-pay | Source: Ambulatory Visit | Attending: Urology

## 2017-10-13 ENCOUNTER — Encounter (HOSPITAL_BASED_OUTPATIENT_CLINIC_OR_DEPARTMENT_OTHER): Payer: Self-pay

## 2017-10-13 ENCOUNTER — Ambulatory Visit (HOSPITAL_BASED_OUTPATIENT_CLINIC_OR_DEPARTMENT_OTHER)
Admission: RE | Admit: 2017-10-13 | Discharge: 2017-10-13 | Disposition: A | Discharge status: Home / Self Care | Payer: BLUE CROSS/BLUE SHIELD | Source: Ambulatory Visit | Attending: Urology | Admitting: Urology

## 2017-10-13 DIAGNOSIS — N131 Hydronephrosis with ureteral stricture, not elsewhere classified: Secondary | ICD-10-CM | POA: Insufficient documentation

## 2017-10-13 DIAGNOSIS — C787 Secondary malignant neoplasm of liver and intrahepatic bile duct: Secondary | ICD-10-CM | POA: Insufficient documentation

## 2017-10-13 DIAGNOSIS — C19 Malignant neoplasm of rectosigmoid junction: Secondary | ICD-10-CM | POA: Diagnosis not present

## 2017-10-13 DIAGNOSIS — Z9221 Personal history of antineoplastic chemotherapy: Secondary | ICD-10-CM | POA: Diagnosis not present

## 2017-10-13 DIAGNOSIS — C786 Secondary malignant neoplasm of retroperitoneum and peritoneum: Secondary | ICD-10-CM | POA: Insufficient documentation

## 2017-10-13 DIAGNOSIS — Z79899 Other long term (current) drug therapy: Secondary | ICD-10-CM | POA: Insufficient documentation

## 2017-10-13 DIAGNOSIS — R609 Edema, unspecified: Secondary | ICD-10-CM | POA: Insufficient documentation

## 2017-10-13 DIAGNOSIS — Z87891 Personal history of nicotine dependence: Secondary | ICD-10-CM | POA: Insufficient documentation

## 2017-10-13 DIAGNOSIS — M79662 Pain in left lower leg: Secondary | ICD-10-CM | POA: Diagnosis not present

## 2017-10-13 DIAGNOSIS — Z8249 Family history of ischemic heart disease and other diseases of the circulatory system: Secondary | ICD-10-CM | POA: Diagnosis not present

## 2017-10-13 DIAGNOSIS — M7989 Other specified soft tissue disorders: Secondary | ICD-10-CM | POA: Diagnosis not present

## 2017-10-13 HISTORY — PX: CYSTOSCOPY W/ RETROGRADES: SHX1426

## 2017-10-13 HISTORY — PX: CYSTOSCOPY W/ URETERAL STENT PLACEMENT: SHX1429

## 2017-10-13 HISTORY — DX: Other reaction to spinal and lumbar puncture: G97.1

## 2017-10-13 HISTORY — PX: CYSTOSCOPY WITH FULGERATION: SHX6638

## 2017-10-13 HISTORY — DX: Personal history of antineoplastic chemotherapy: Z92.21

## 2017-10-13 LAB — URINE CULTURE: CULTURE: NO GROWTH

## 2017-10-13 SURGERY — CYSTOSCOPY, FLEXIBLE, WITH STENT REPLACEMENT
Anesthesia: General | Site: Ureter | Laterality: Right

## 2017-10-13 MED ORDER — PHENAZOPYRIDINE HCL 200 MG PO TABS
200.0000 mg | ORAL_TABLET | Freq: Three times a day (TID) | ORAL | Status: DC
  Administered 2017-10-13: 200 mg via ORAL
  Filled 2017-10-13: qty 1

## 2017-10-13 MED ORDER — IOHEXOL 300 MG/ML  SOLN
INTRAMUSCULAR | Status: DC | PRN
  Administered 2017-10-13: 10 mL

## 2017-10-13 MED ORDER — PHENAZOPYRIDINE HCL 100 MG PO TABS
ORAL_TABLET | ORAL | Status: AC
  Filled 2017-10-13: qty 2

## 2017-10-13 MED ORDER — DEXAMETHASONE SODIUM PHOSPHATE 10 MG/ML IJ SOLN
INTRAMUSCULAR | Status: DC | PRN
  Administered 2017-10-13: 10 mg via INTRAVENOUS

## 2017-10-13 MED ORDER — PROPOFOL 10 MG/ML IV BOLUS
INTRAVENOUS | Status: DC | PRN
  Administered 2017-10-13: 180 mg via INTRAVENOUS

## 2017-10-13 MED ORDER — WHITE PETROLATUM EX OINT
TOPICAL_OINTMENT | CUTANEOUS | Status: AC
  Filled 2017-10-13: qty 5

## 2017-10-13 MED ORDER — CEFAZOLIN SODIUM-DEXTROSE 2-4 GM/100ML-% IV SOLN
2.0000 g | Freq: Once | INTRAVENOUS | Status: AC
  Administered 2017-10-13: 2 g via INTRAVENOUS
  Filled 2017-10-13: qty 100

## 2017-10-13 MED ORDER — SODIUM CHLORIDE 0.9 % IR SOLN
Status: DC | PRN
  Administered 2017-10-13 (×2): 3000 mL via INTRAVESICAL

## 2017-10-13 MED ORDER — SULFAMETHOXAZOLE-TRIMETHOPRIM 800-160 MG PO TABS: 1.0000 | ORAL_TABLET | Freq: Two times a day (BID) | ORAL | 0 refills | Status: AC

## 2017-10-13 MED ORDER — MIDAZOLAM HCL 2 MG/2ML IJ SOLN
INTRAMUSCULAR | Status: DC | PRN
  Administered 2017-10-13 (×2): 1 mg via INTRAVENOUS

## 2017-10-13 MED ORDER — PROMETHAZINE HCL 25 MG/ML IJ SOLN
6.2500 mg | INTRAMUSCULAR | Status: DC | PRN
  Filled 2017-10-13: qty 1

## 2017-10-13 MED ORDER — FENTANYL CITRATE (PF) 250 MCG/5ML IJ SOLN
INTRAMUSCULAR | Status: AC
  Filled 2017-10-13: qty 5

## 2017-10-13 MED ORDER — DEXAMETHASONE SODIUM PHOSPHATE 10 MG/ML IJ SOLN
INTRAMUSCULAR | Status: AC
  Filled 2017-10-13: qty 1

## 2017-10-13 MED ORDER — PROPOFOL 10 MG/ML IV BOLUS
INTRAVENOUS | Status: AC
  Filled 2017-10-13: qty 20

## 2017-10-13 MED ORDER — LACTATED RINGERS IV SOLN
INTRAVENOUS | Status: DC
  Administered 2017-10-13: 07:00:00 via INTRAVENOUS
  Filled 2017-10-13: qty 1000

## 2017-10-13 MED ORDER — FENTANYL CITRATE (PF) 100 MCG/2ML IJ SOLN
INTRAMUSCULAR | Status: AC
  Filled 2017-10-13: qty 2

## 2017-10-13 MED ORDER — ONDANSETRON HCL 4 MG/2ML IJ SOLN
INTRAMUSCULAR | Status: AC
  Filled 2017-10-13: qty 2

## 2017-10-13 MED ORDER — PHENAZOPYRIDINE HCL 200 MG PO TABS: 200.0000 mg | ORAL_TABLET | Freq: Three times a day (TID) | ORAL | 0 refills | Status: DC | PRN

## 2017-10-13 MED ORDER — CEFAZOLIN SODIUM-DEXTROSE 2-4 GM/100ML-% IV SOLN
INTRAVENOUS | Status: AC
  Filled 2017-10-13: qty 100

## 2017-10-13 MED ORDER — FENTANYL CITRATE (PF) 100 MCG/2ML IJ SOLN
25.0000 ug | INTRAMUSCULAR | Status: DC | PRN
  Filled 2017-10-13: qty 1

## 2017-10-13 MED ORDER — MIDAZOLAM HCL 2 MG/2ML IJ SOLN
INTRAMUSCULAR | Status: AC
  Filled 2017-10-13: qty 2

## 2017-10-13 MED ORDER — EPHEDRINE 5 MG/ML INJ
INTRAVENOUS | Status: AC
  Filled 2017-10-13: qty 10

## 2017-10-13 MED ORDER — MEPERIDINE HCL 25 MG/ML IJ SOLN
6.2500 mg | INTRAMUSCULAR | Status: DC | PRN
  Filled 2017-10-13: qty 1

## 2017-10-13 MED ORDER — FENTANYL CITRATE (PF) 100 MCG/2ML IJ SOLN
INTRAMUSCULAR | Status: DC | PRN
  Administered 2017-10-13: 25 ug via INTRAVENOUS
  Administered 2017-10-13: 50 ug via INTRAVENOUS
  Administered 2017-10-13 (×3): 25 ug via INTRAVENOUS

## 2017-10-13 MED ORDER — LIDOCAINE 2% (20 MG/ML) 5 ML SYRINGE
INTRAMUSCULAR | Status: DC | PRN
  Administered 2017-10-13: 60 mg via INTRAVENOUS

## 2017-10-13 MED ORDER — EPHEDRINE SULFATE-NACL 50-0.9 MG/10ML-% IV SOSY
PREFILLED_SYRINGE | INTRAVENOUS | Status: DC | PRN
  Administered 2017-10-13: 10 mg via INTRAVENOUS

## 2017-10-13 MED ORDER — ONDANSETRON HCL 4 MG/2ML IJ SOLN
INTRAMUSCULAR | Status: DC | PRN
  Administered 2017-10-13: 4 mg via INTRAVENOUS

## 2017-10-13 SURGICAL SUPPLY — 30 items
BAG DRAIN URO-CYSTO SKYTR STRL (DRAIN) ×5 IMPLANT
BAG DRN UROCATH (DRAIN) ×3
BASKET STONE 1.7 NGAGE (UROLOGICAL SUPPLIES) IMPLANT
BASKET ZERO TIP NITINOL 2.4FR (BASKET) ×5 IMPLANT
BSKT STON RTRVL ZERO TP 2.4FR (BASKET) ×3
CATH INTERMIT  6FR 70CM (CATHETERS) IMPLANT
CLOTH BEACON ORANGE TIMEOUT ST (SAFETY) ×5 IMPLANT
FIBER LASER TRAC TIP (UROLOGICAL SUPPLIES) IMPLANT
GLOVE BIO SURGEON STRL SZ 6.5 (GLOVE) ×1 IMPLANT
GLOVE BIO SURGEON STRL SZ7.5 (GLOVE) ×7 IMPLANT
GLOVE BIO SURGEONS STRL SZ 6.5 (GLOVE) ×1
GLOVE BIOGEL PI IND STRL 7.5 (GLOVE) IMPLANT
GLOVE BIOGEL PI INDICATOR 7.5 (GLOVE) ×4
GLOVE INDICATOR 6.5 STRL GRN (GLOVE) ×2 IMPLANT
GOWN STRL REUS W/TWL XL LVL3 (GOWN DISPOSABLE) ×4 IMPLANT
GUIDEWIRE ANG ZIPWIRE 038X150 (WIRE) ×5 IMPLANT
GUIDEWIRE STR DUAL SENSOR (WIRE) IMPLANT
INFUSOR MANOMETER BAG 3000ML (MISCELLANEOUS) ×3 IMPLANT
IV NS 1000ML (IV SOLUTION)
IV NS 1000ML BAXH (IV SOLUTION) IMPLANT
IV NS IRRIG 3000ML ARTHROMATIC (IV SOLUTION) ×7 IMPLANT
KIT RM TURNOVER CYSTO AR (KITS) ×5 IMPLANT
MANIFOLD NEPTUNE II (INSTRUMENTS) ×5 IMPLANT
NS IRRIG 500ML POUR BTL (IV SOLUTION) ×6 IMPLANT
PACK CYSTO (CUSTOM PROCEDURE TRAY) ×5 IMPLANT
STENT POLARIS LOOP 8FR X 26 CM (STENTS) ×2 IMPLANT
STENT URET 6FRX26 CONTOUR (STENTS) ×2 IMPLANT
SYRINGE 10CC LL (SYRINGE) ×5 IMPLANT
TUBE CONNECTING 12'X1/4 (SUCTIONS) ×1
TUBE CONNECTING 12X1/4 (SUCTIONS) ×1 IMPLANT

## 2017-10-13 NOTE — Anesthesia Procedure Notes (Signed)
Procedure Name: LMA Insertion Date/Time: 10/13/2017 10:42 AM Performed by: Suan Halter, CRNA Pre-anesthesia Checklist: Patient identified, Emergency Drugs available, Suction available and Patient being monitored Patient Re-evaluated:Patient Re-evaluated prior to induction Oxygen Delivery Method: Circle system utilized Preoxygenation: Pre-oxygenation with 100% oxygen Induction Type: IV induction Ventilation: Mask ventilation without difficulty LMA: LMA inserted LMA Size: 4.0 Number of attempts: 1 Airway Equipment and Method: Bite block Placement Confirmation: positive ETCO2 Tube secured with: Tape Dental Injury: Teeth and Oropharynx as per pre-operative assessment

## 2017-10-13 NOTE — Op Note (Signed)
Operative Note  Preoperative diagnosis:  1.  Bilateral ureteral obstruction secondary to stage IV colon cancer  Postoperative diagnosis: 1.  Bilateral ureteral obstruction secondary to stage IV colon cancer  Procedure(s): 1. Cystoscopy 2. Left Polaris stent exchange 3. Right retrograde pyelogram 4. Right JJ stent placement  Surgeon: Ellison Hughs, MD  Assistants:  None  Anesthesia:  Gen. LMA  Complications:  None  EBL:  5 mL  Specimens: 1. Previously placed left Polaris stent was inspected and discarded  Drains/Catheters: 1. Left 6 French by 26 cm Polaris ureteral stent 2. Right 6 French by 26 cm JJ stent without tether  Intraoperative findings:  1. Large mass effect on the bladder from her pelvic mass causing distortion of the right ureteral orifice 2. Bilateral stents in good position  Indication:  Carolyn Barton is a 62 y.o. female with a history of stage IV colon cancer with a large pelvic mass recurrence. She was evaluated in the emergency department over the weekend secondary to acute onset of right-sided flank pain. She had a CT of the abdomen/pelvis at that time that demonstrated right-sided hydronephrosis as a likely source of her flank pain. She has a history of left ureteral obstruction secondary to her cancer and has had an indwelling stent in place since June 2018 and hasn't changed up periodically. She is here today for the above procedures, voices understanding and wishes to proceed.  Description of procedure:  After informed consent was obtained, the patient was brought to the operating room and general LMA anesthesia was administered. The patient was then placed in the dorsolithotomy position and prepped and draped in usual sterile fashion. A timeout was performed. A 21 French rigid cystoscope was then inserted into the urethral meatus and advanced into the bladder under direct vision. A complete bladder survey revealed a large mass effect from her  pelvic mass on the bladder, preventing distention. Her left Polaris stent was identified, grasped and retracted to the urethral meatus. A Glidewire was then advanced through the lumen of the stent and it advanced up to the left renal pelvis, under fluoroscopic guidance. Her previously placed stent was then removed over the wire, inspected and discarded. A new 6 [place] by 26 cm Polaris stent was then advanced over the wire and into good position within the left collecting system, confirming placement via fluoroscopy.  We then attempted to identify her right ureteral orifice, but it was distorted by the mass effect on her bladder. We were finally able to identify the orifice and gain wire access. A 6 French ureteral catheter was then advanced over the wire and into position within the distal aspects of the right ureter. A right retrograde pyelogram was obtained that showed narrowing of the distal aspects of the right ureter, with dilation of the mid and proximal aspects of the ureter as well as dilation of the right renal pelvis and its associated calyces. A wire was then navigated through the ureteral catheter and up to the right renal pelvis, confirmed by fluoroscopy. A 6 French by 26 cm JJ stent was then advanced over the wire and into good position within the right collecting system, confirming placement via fluoroscopy. The patient's bladder was then drained. She tolerated the procedure well and was transferred to the postanesthesia in stable condition.  Plan:  The patient had left lower extremity pain and edema prior to the procedure. We'll check a lower extremity duplex to rule out a DVT as the source of her location any pain  and edema. I spoke with Dr. [name] Coder with medical oncology and if her lower extremity duplex is positive, he will address it accordingly.  Plan on exchanging her bilateral stents in 3-4 months.

## 2017-10-13 NOTE — Anesthesia Postprocedure Evaluation (Signed)
Anesthesia Post Note  Patient: JALISHA ENNEKING  Procedure(s) Performed: CYSTOSCOPY WITH STENT REPLACEMENT (Left Ureter) CYSTOSCOPY WITH RETROGRADE PYELOGRAM/ STENT PLACEMENT (Right ) CYSTOSCOPY WITH FULGERATION (N/A Bladder)     Patient location during evaluation: PACU Anesthesia Type: General Level of consciousness: awake and alert Pain management: pain level controlled Vital Signs Assessment: post-procedure vital signs reviewed and stable Respiratory status: spontaneous breathing, nonlabored ventilation and respiratory function stable Cardiovascular status: blood pressure returned to baseline and stable Postop Assessment: no apparent nausea or vomiting Anesthetic complications: no    Last Vitals:  Vitals:   10/13/17 1200 10/13/17 1215  BP: (!) 147/89 (!) 146/88  Pulse: 87 85  Resp: 15 13  Temp:    SpO2: 100% 100%    Last Pain:  Vitals:   10/13/17 0700  TempSrc:   PainSc: [place]

## 2017-10-13 NOTE — Interval H&P Note (Signed)
History and Physical Interval Note:  10/13/2017 7:32 AM  Carolyn Barton  has presented today for surgery, with the diagnosis of BILATERAL HYDRONEPHROSIS  The various methods of treatment have been discussed with the patient and family. After consideration of risks, benefits and other options for treatment, the patient has consented to  Procedure(s) with comments: CYSTOSCOPY WITH STENT REPLACEMENT (Left) - ONLY NEEDS 30 MIN FOR BOTH PROCEDURES CYSTOSCOPY WITH RETROGRADE PYELOGRAM/ STENT PLACEMENT (Right) - ONLY NEEDS 30 MIN FOR BOTH PROCEDURES as a surgical intervention .  The patient's history has been reviewed, patient examined, no change in status, stable for surgery.  I have reviewed the patient's chart and labs.  Questions were answered to the patient's satisfaction.     Conception Oms [name]

## 2017-10-13 NOTE — Progress Notes (Signed)
Pt and spouse informed of Dr. Jackson Latino orders to place foley w removal in am at home if pvr > 500. Both verbalized their agreement.  Pt assisted to stretcher. #16 fr. Foley inserted w/o difficulty.  700 cc's clear pink urine obtained.  Pt stated  immediate relief.. Pt tolerated procedure well.  Pt instructed to remove cath in am early enough to allow herself a 4 hr window to void.  Pt encouraged to force fluids and if no void in 4 hrs after removal , she is to call the office.  If she is uncomfortable and unable to void s/p removal and the 4 hrs hasn't passed, she is to call the office. Pt and spouse verbalized their understanding.

## 2017-10-13 NOTE — Progress Notes (Signed)
*  Preliminary Results* Bilateral lower extremity venous duplex completed. Bilateral lower extremities are negative for deep vein thrombosis. There is no evidence of Baker's cyst bilaterally.  10/13/2017 1:15 PM Maudry Mayhew, BS, RVT, RDCS, RDMS

## 2017-10-13 NOTE — Transfer of Care (Signed)
   Last Vitals:  Vitals:   10/13/17 0637 10/13/17 1132  BP: (!) 159/86 (!) 148/90  Pulse: (!) 105 86  Resp: 16 14  Temp: 37.1 C (!) 36.4 C  SpO2: 100% 100%    Last Pain:  Vitals:   10/13/17 0700  TempSrc:   PainSc: 7          Complications: No anesthetic complications

## 2017-10-13 NOTE — Discharge Instructions (Signed)
Post Anesthesia Home Care Instructions  Activity: Get plenty of rest for the remainder of the day. A responsible individual must stay with you for 24 hours following the procedure.  For the next 24 hours, DO NOT: -Drive a car -Operate machinery -Drink alcoholic beverages -Take any medication unless instructed by your physician -Make any legal decisions or sign important papers.  Meals: Start with liquid foods such as gelatin or soup. Progress to regular foods as tolerated. Avoid greasy, spicy, heavy foods. If nausea and/or vomiting occur, drink only clear liquids until the nausea and/or vomiting subsides. Call your physician if vomiting continues.  Special Instructions/Symptoms: Your throat may feel dry or sore from the anesthesia or the breathing tube placed in your throat during surgery. If this causes discomfort, gargle with warm salt water. The discomfort should disappear within 24 hours.  If you had a scopolamine patch placed behind your ear for the management of post- operative nausea and/or vomiting:  1. The medication in the patch is effective for 72 hours, after which it should be removed.  Wrap patch in a tissue and discard in the trash. Wash hands thoroughly with soap and water. 2. You may remove the patch earlier than 72 hours if you experience unpleasant side effects which may include dry mouth, dizziness or visual disturbances. 3. Avoid touching the patch. Wash your hands with soap and water after contact with the patch.      [facility] [phone] Post Ureteroscopy With or Without Stent Instructions  Definitions:  Ureter: The duct that transports urine from the kidney to the bladder. Stent:   A plastic hollow tube that is placed into the ureter, from the kidney to the bladder to prevent the ureter from swelling shut.  GENERAL INSTRUCTIONS:  Despite the fact that no skin incisions were used, the area around the ureter and bladder is raw and  irritated. The stent is a foreign body which will further irritate the bladder wall. This irritation is manifested by increased frequency of urination, both day and night, and by an increase in the urge to urinate. In some, the urge to urinate is present almost always. Sometimes the urge is strong enough that you may not be able to stop yourself from urinating. The only real cure is to remove the stent and then give time for the bladder wall to heal which can't be done until the danger of the ureter swelling shut has passed, which varies.  You may see some blood in your urine while the stent is in place and a few days afterwards. Do not be alarmed, even if the urine was clear for a while. Get off your feet and drink lots of fluids until clearing occurs. If you start to pass clots or don't improve, call us.  DIET: You may return to your normal diet immediately. Because of the raw surface of your bladder, alcohol, spicy foods, acid type foods and drinks with caffeine may cause irritation or frequency and should be used in moderation. To keep your urine flowing freely and to avoid constipation, drink plenty of fluids during the day ( 8-10 glasses ). Tip: Avoid cranberry juice because it is very acidic.  ACTIVITY: Your physical activity doesn't need to be restricted. However, if you are very active, you may see some blood in your urine. We suggest that you reduce your activity under these circumstances until the bleeding has stopped.  BOWELS: It is important to keep your bowels regular during the postoperative period. Straining   with bowel movements can cause bleeding. A bowel movement every other day is reasonable. Use a mild laxative if needed, such as Milk of Magnesia 2-3 tablespoons, or 2 Dulcolax tablets. Call if you continue to have problems. If you have been taking narcotics for pain, before, during or after your surgery, you may be constipated. Take a laxative if necessary.   MEDICATION: You  should resume your pre-surgery medications unless told not to. In addition you will often be given an antibiotic to prevent infection. These should be taken as prescribed until the bottles are finished unless you are having an unusual reaction to one of the drugs.  PROBLEMS YOU SHOULD REPORT TO US: Fevers over 100.5 Fahrenheit. Heavy bleeding, or clots ( See above notes about blood in urine ). Inability to urinate. Drug reactions ( hives, rash, nausea, vomiting, diarrhea ). Severe burning or pain with urination that is not improving.  FOLLOW-UP: You will need a follow-up appointment to monitor your progress. Call for this appointment at the number listed above. Usually the first appointment will be about three to fourteen days after your surgery.      

## 2017-10-14 ENCOUNTER — Encounter (HOSPITAL_BASED_OUTPATIENT_CLINIC_OR_DEPARTMENT_OTHER): Payer: Self-pay | Admitting: Urology

## 2017-10-15 ENCOUNTER — Telehealth: Payer: Self-pay

## 2017-10-15 NOTE — Telephone Encounter (Signed)
Pt called to report "horrible pain in my left leg". States that doppler was performed during hospitalization for stent placement and "no blood clot" was found. "Any suggestions?". Per Dr. Benay Spice, pt to try dilaudid prescribed for pain relief. Patient voiced understanding. Pt voiced understanding.

## 2017-10-19 ENCOUNTER — Ambulatory Visit (HOSPITAL_COMMUNITY)
Admission: RE | Admit: 2017-10-19 | Discharge: 2017-10-19 | Disposition: A | Discharge status: Home / Self Care | Payer: BLUE CROSS/BLUE SHIELD | Source: Ambulatory Visit | Attending: Nurse Practitioner | Admitting: Nurse Practitioner

## 2017-10-19 ENCOUNTER — Inpatient Hospital Stay (HOSPITAL_BASED_OUTPATIENT_CLINIC_OR_DEPARTMENT_OTHER): Payer: BLUE CROSS/BLUE SHIELD | Admitting: Nurse Practitioner

## 2017-10-19 ENCOUNTER — Telehealth: Payer: Self-pay | Admitting: Medical Oncology

## 2017-10-19 ENCOUNTER — Encounter: Payer: Self-pay | Admitting: Nurse Practitioner

## 2017-10-19 ENCOUNTER — Other Ambulatory Visit: Payer: Self-pay

## 2017-10-19 VITALS — BP 151/98 | HR 119 | Temp 98.2°F | Resp 18 | Ht 65.0 in | Wt 136.3 lb

## 2017-10-19 DIAGNOSIS — C189 Malignant neoplasm of colon, unspecified: Secondary | ICD-10-CM | POA: Insufficient documentation

## 2017-10-19 DIAGNOSIS — R600 Localized edema: Secondary | ICD-10-CM | POA: Diagnosis not present

## 2017-10-19 DIAGNOSIS — C785 Secondary malignant neoplasm of large intestine and rectum: Secondary | ICD-10-CM

## 2017-10-19 DIAGNOSIS — M79605 Pain in left leg: Secondary | ICD-10-CM | POA: Insufficient documentation

## 2017-10-19 DIAGNOSIS — C19 Malignant neoplasm of rectosigmoid junction: Secondary | ICD-10-CM

## 2017-10-19 DIAGNOSIS — K5900 Constipation, unspecified: Secondary | ICD-10-CM

## 2017-10-19 DIAGNOSIS — C20 Malignant neoplasm of rectum: Secondary | ICD-10-CM | POA: Diagnosis not present

## 2017-10-19 DIAGNOSIS — R1907 Generalized intra-abdominal and pelvic swelling, mass and lump: Secondary | ICD-10-CM | POA: Diagnosis not present

## 2017-10-19 DIAGNOSIS — C787 Secondary malignant neoplasm of liver and intrahepatic bile duct: Secondary | ICD-10-CM | POA: Diagnosis not present

## 2017-10-19 DIAGNOSIS — M6289 Other specified disorders of muscle: Secondary | ICD-10-CM

## 2017-10-19 DIAGNOSIS — C786 Secondary malignant neoplasm of retroperitoneum and peritoneum: Secondary | ICD-10-CM

## 2017-10-19 DIAGNOSIS — R609 Edema, unspecified: Secondary | ICD-10-CM | POA: Diagnosis not present

## 2017-10-19 DIAGNOSIS — M79662 Pain in left lower leg: Secondary | ICD-10-CM | POA: Diagnosis not present

## 2017-10-19 MED ORDER — MORPHINE SULFATE 15 MG PO TABS: 15.0000 mg | ORAL_TABLET | Freq: Once | ORAL | Status: DC

## 2017-10-19 MED ORDER — MORPHINE SULFATE 15 MG PO TABS
15.0000 mg | ORAL_TABLET | Freq: Once | ORAL | Status: AC
  Administered 2017-10-19: 15 mg via ORAL

## 2017-10-19 MED ORDER — MORPHINE SULFATE 15 MG PO TABS
ORAL_TABLET | ORAL | Status: AC
  Filled 2017-10-19: qty 1

## 2017-10-19 MED ORDER — SORBITOL 70 % PO SOLN
ORAL | 0 refills | Status: DC
Start: 2017-10-19 — End: 2018-01-05

## 2017-10-19 MED ORDER — MORPHINE SULFATE 15 MG PO TABS: 15.0000 mg | ORAL_TABLET | ORAL | 0 refills | Status: DC | PRN

## 2017-10-19 NOTE — Progress Notes (Signed)
Left lower extremity venous duplex has been completed. Negative for DVT. Results were given to Diane at Ned Card' NP office.  10/19/17 2:25 PM Carolyn Barton RVT

## 2017-10-19 NOTE — Progress Notes (Signed)
MSIR 15mg  tab given to patient at 1146a. Unable to scan in [facility].  Cyndia Bent RN

## 2017-10-19 NOTE — Progress Notes (Signed)
Patient on plan of care prior to pathways. 

## 2017-10-19 NOTE — Progress Notes (Signed)
START OFF PATHWAY REGIMEN - Colorectal   [id number]:FOLFIRI (q14d):   A cycle is every 14 days:     Irinotecan      Leucovorin      5-Fluorouracil      5-Fluorouracil   **Always confirm dose/schedule in your pharmacy ordering system**    Patient Characteristics: Metastatic Colorectal, First Line, Nonsurgical Candidate, KRAS/NRAS Wild-Type, BRAF Wild-Type/Unknown, PS = 0,1; Bevacizumab Ineligible Current evidence of distant metastases<= Yes AJCC T Category: Staged < 8th Ed. AJCC N Category: Staged < 8th Ed. AJCC M Category: Staged < 8th Ed. AJCC 8 Stage Grouping: Staged < 8th Ed. BRAF Mutation Status: Wild Type (no mutation) KRAS/NRAS Mutation Status: Wild Type (no mutation) Line of therapy: First Line Would you be surprised if this patient died  in the next year<= I would NOT be surprised if this patient died in the next year Performance Status: PS = 0, 1 Bevacizumab Eligibility: Ineligible Intent of Therapy: Non-Curative / Palliative Intent, Discussed with Patient

## 2017-10-19 NOTE — Progress Notes (Addendum)
Carolyn Barton returns as scheduled.  She is having significant left leg pain and swelling.  The pain is located at the lower leg.  She has tried Dilaudid with no improvement.  She is having constipation.  She tried MiraLAX with no improvement.  She reports developing urinary retention and had an indwelling Foley catheter placed last week.  Objective:  Vital signs in last 24 hours:  Blood pressure (!) 151/98, pulse (!) 119, temperature 98.2 F (36.8 C), temperature source Oral, resp. rate 18, height '5\' 5"'$  (1.651 m), weight 136 lb 4.8 oz (61.8 kg), SpO2 100 %.    HEENT: White coating over tongue. Resp: Lungs clear bilaterally. Cardio: Regular, tachycardic. GI: Left lower quadrant colostomy.  No hepatomegaly.  Firm fullness left lower quadrant. Vascular: Edema throughout the left leg. Port-A-Cath without erythema.  Lab Results:  Lab Results  Component Value Date   WBC 8.8 10/12/2017   HGB 14.1 10/12/2017   HCT 43.1 10/12/2017   MCV 89.5 10/12/2017   PLT 430 (H) 10/12/2017   NEUTROABS 7.4 (H) 10/12/2017    Imaging:  No results found.  Medications: I have reviewed the patient's current medications.  Assessment/Plan: 1.Metastatic colorectal cancer-status post an exploratory laparotomy 03/03/2016 revealing a proximal rectal mass, peritoneal carcinomatosis, and liver metastases  Biopsy of peritoneal nodules 03/03/2016 confirmed metastatic adenocarcinoma consistent with a colon primary  Foundation 1 testing-MSI-stable, tumor mutation burden-low, no BRAF NRAS or KRAS mutation  Cycle 1 FOLFIRI/PANITUMUMAB 04/14/2016  Cycle 2 FOLFIRI/panitumumab 04/28/2016  Cycle 3 FOLFIRI/panitumumab 05/12/2016  Cycle 4 FOLFIRI/panitumumab 05/27/2016  Cycle 5 FOLFIRI/panitumumab 06/09/2016  Restaging CT abdomen/pelvis 06/20/2016-no evidence of disease progression, decreased left hepatic  lesion  Cycle 6 FOLFIRI/panitumumab 06/23/2016  Cycle 7 FOLFIRI/panitumumab 07/07/2016  Cycle 8 FOLFIRI/panitumumab 07/21/2016  Cycle 9 FOLFIRI/PANITUMUMAB 08/04/2016  Cycle 10 FOLFIRI/panitumumab 08/18/2016  Restaging CT 08/29/2016 with interval decrease in the size of the medial segment left liver lesion. Lesion identified previously in the rectosigmoid colon not evident on the current study.  5-FU/PANITUMUMAB 09/01/2016  Xeloda 7 days on/7 days off and PANITUMUMAB every 3 weeks 09/15/2016 (awaiting insurance approval for Xeloda 09/15/2016)  CT abdomen/pelvis 12/25/2016-unchanged 5 mm right hepatic lesion, resolution of medial segment left liver lesion, no new liver lesion. Residual soft tissue fullness in the sigmoid , persistent distal esophageal wall thickening  Continuationof PANITUMUMAB every 3 weeks and Xeloda 7 days on/7 days off  CT 06/19/2017-new cystic/solid lesion in the left adnexa  Xeloda/panitumumab continued  CT 10/08/2016-progression of cystic pelvic mass  2.History of aBowel obstruction secondary to #1  3. Chronic back pain  4. Essential thrombocytosis  5. Early obstruction of the left ureter noted at the time of surgery 03/03/2016-Dr. Tannenbaum placed a left double-J stent 03/18/2016  New onset right hydronephrosis 10/11/2017  6. Abdominal wall cellulitis 03/10/2016, blood cultures positive for coagulase negative staphylococcus-methicillin-resistant, status post treatment with vancomycin and Bactrim  7. Tachycardia-persistent following discharge from the hospital, likely related to anemia and deconditioning  CT chest 04/04/2016-negative for pulmonary embolism.  8. Port-A-Cath placement 04/10/2016, interventional radiology  9. Iron deficiency anemia. Feraheme 11/05/2015 , 11/12/2015,and 04/14/2016-persistent anemia and red cell microcytosis, oral iron resumed 11/17/2016-stable  10. Pain/tenderness left posterior iliac  region-resolved  11. Hypokalemia-started on potassium replacement 06/09/2016  12. Skin rashand paronychiasecondary to [facility], she continues minocycline, moisturizers, and fluticasone  13.  Acute onset right low back pain 10/11/2017-likely secondary to obstruction of the right  kidney; left ureter stent exchange and right ureter stent placement 10/13/2017  14.  Left leg pain and edema-bilateral lower extremity venous Doppler negative for DVT.  The pain and swelling are likely due to venous/lymphatic obstruction from tumor.     [name] has progressive metastatic colon cancer.  Recent restaging CT showed progression of the cystic pelvic mass.  The CT was reviewed by GYN oncology and the mass is not felt to be resectable.  Her case was also reviewed by interventional radiology.  Attempt at drainage of the mass is not recommended.  Dr. Benay Spice recommends retreatment with FOLFIRI/Panitumumab.  She is in agreement with this plan.  The left leg pain and swelling are likely due to venous/lymphatic obstruction from tumor.  She had a negative venous Doppler last week.  We will obtain a repeat venous Doppler today.  She will try MSIR 15 mg 1-2 tablets every 4 hours as needed for pain.  For constipation she will begin sorbitol.  She will return for lab/follow-up/FOLFIRI/Panitumumab 10/21/2017.  She will contact the office in the interim with any problems.  Patient seen with Dr. Benay Spice.  25 minutes were spent face-to-face at today's visit with the majority of that time involved in counseling/coordination of care.    Ned Card ANP/GNP-BC   10/19/2017  11:22 AM  This was a shared visit with Ned Card.  Carolyn Barton was interviewed and examined.  She has progressive metastatic colon cancer involving a large pelvic mass.  The pelvic mass likely accounts for the lower extremity edema and pain.  I presented her case at the GI tumor conference last week and discussed the  case with GYN oncology.  The pelvic mass does not appear resectable. She underwent placement of palliative bilateral ureter stents last week.  A Doppler of the legs was negative on 10/13/2017.  We will refer her for a repeat Doppler today.  We adjusted the narcotic pain regimen.  I recommend resuming FOLFIRI/Panitumumab.  She will be scheduled for chemotherapy within the next few days.  Carolyn Barton will return for an office visit 10/21/2017.  30 minutes were spent with the patient today.  The majority of the time was used for counseling and coordination of care.

## 2017-10-19 NOTE — Telephone Encounter (Signed)
L leg venous duplex negative for DVT

## 2017-10-20 ENCOUNTER — Telehealth: Payer: Self-pay | Admitting: Oncology

## 2017-10-20 NOTE — Telephone Encounter (Signed)
Called patient regarding 1/23

## 2017-10-21 ENCOUNTER — Inpatient Hospital Stay: Payer: BLUE CROSS/BLUE SHIELD

## 2017-10-21 ENCOUNTER — Inpatient Hospital Stay (HOSPITAL_BASED_OUTPATIENT_CLINIC_OR_DEPARTMENT_OTHER): Payer: BLUE CROSS/BLUE SHIELD | Admitting: Oncology

## 2017-10-21 ENCOUNTER — Encounter: Payer: Self-pay | Admitting: General Practice

## 2017-10-21 ENCOUNTER — Other Ambulatory Visit: Payer: BLUE CROSS/BLUE SHIELD

## 2017-10-21 ENCOUNTER — Ambulatory Visit: Payer: BLUE CROSS/BLUE SHIELD | Admitting: Oncology

## 2017-10-21 ENCOUNTER — Telehealth: Payer: Self-pay | Admitting: Oncology

## 2017-10-21 ENCOUNTER — Ambulatory Visit: Payer: BLUE CROSS/BLUE SHIELD

## 2017-10-21 VITALS — BP 147/84 | HR 97 | Temp 98.0°F | Resp 17

## 2017-10-21 VITALS — BP 137/95 | HR 110 | Temp 98.6°F | Resp 18 | Ht 63.0 in | Wt 138.5 lb

## 2017-10-21 DIAGNOSIS — C786 Secondary malignant neoplasm of retroperitoneum and peritoneum: Secondary | ICD-10-CM | POA: Diagnosis not present

## 2017-10-21 DIAGNOSIS — R1907 Generalized intra-abdominal and pelvic swelling, mass and lump: Secondary | ICD-10-CM

## 2017-10-21 DIAGNOSIS — M549 Dorsalgia, unspecified: Secondary | ICD-10-CM | POA: Diagnosis not present

## 2017-10-21 DIAGNOSIS — C19 Malignant neoplasm of rectosigmoid junction: Secondary | ICD-10-CM

## 2017-10-21 DIAGNOSIS — C787 Secondary malignant neoplasm of liver and intrahepatic bile duct: Secondary | ICD-10-CM

## 2017-10-21 DIAGNOSIS — R600 Localized edema: Secondary | ICD-10-CM

## 2017-10-21 DIAGNOSIS — M79662 Pain in left lower leg: Secondary | ICD-10-CM | POA: Diagnosis not present

## 2017-10-21 DIAGNOSIS — N1330 Unspecified hydronephrosis: Secondary | ICD-10-CM | POA: Diagnosis not present

## 2017-10-21 DIAGNOSIS — C20 Malignant neoplasm of rectum: Secondary | ICD-10-CM

## 2017-10-21 DIAGNOSIS — Z95828 Presence of other vascular implants and grafts: Secondary | ICD-10-CM

## 2017-10-21 DIAGNOSIS — D509 Iron deficiency anemia, unspecified: Secondary | ICD-10-CM

## 2017-10-21 LAB — CMP (CANCER CENTER ONLY)
ALBUMIN: 2.9 g/dL — AB (ref 3.5–5.0)
ALT: 11 U/L (ref 0–55)
ANION GAP: 9 (ref 3–11)
AST: 14 U/L (ref 5–34)
Alkaline Phosphatase: 107 U/L (ref 40–150)
BUN: 13 mg/dL (ref 7–26)
CALCIUM: 9.4 mg/dL (ref 8.4–10.4)
CHLORIDE: 98 mmol/L (ref 98–109)
CO2: 25 mmol/L (ref 22–29)
Creatinine: 0.73 mg/dL (ref 0.60–1.10)
GFR, Est AFR Am: >60 mL/min (ref >60)
GFR, Estimated: >60 mL/min (ref >60)
GLUCOSE: 111 mg/dL (ref 70–140)
Potassium: 4.1 mmol/L (ref 3.3–4.7)
SODIUM: 132 mmol/L — AB (ref 136–145)
Total Bilirubin: 0.5 mg/dL (ref 0.2–1.2)
Total Protein: 7 g/dL (ref 6.4–8.3)

## 2017-10-21 LAB — CBC WITH DIFFERENTIAL (CANCER CENTER ONLY)
Basophils Absolute: 0.1 10*3/uL (ref 0.0–0.1)
Basophils Relative: 0 %
Eosinophils Absolute: 0.5 10*3/uL (ref 0.0–0.5)
Eosinophils Relative: 4 %
HEMATOCRIT: 40.3 % (ref 34.8–46.6)
HEMOGLOBIN: 13.1 g/dL (ref 11.6–15.9)
LYMPHS ABS: 1 10*3/uL (ref 0.9–3.3)
LYMPHS PCT: 8 %
MCH: 29.2 pg (ref 25.1–34.0)
MCHC: 32.5 g/dL (ref 31.5–36.0)
MCV: 90 fL (ref 79.5–101.0)
MONOS PCT: 13 %
Monocytes Absolute: 1.6 10*3/uL — ABNORMAL HIGH (ref 0.1–0.9)
NEUTROS PCT: 75 %
Neutro Abs: 9.3 10*3/uL — ABNORMAL HIGH (ref 1.5–6.5)
Platelet Count: 643 10*3/uL — ABNORMAL HIGH (ref 145–400)
RBC: 4.48 MIL/uL (ref 3.70–5.45)
RDW: 16.1 % (ref 11.2–16.1)
WBC Count: 12.4 10*3/uL — ABNORMAL HIGH (ref 3.9–10.3)

## 2017-10-21 LAB — MAGNESIUM: Magnesium: 2.1 mg/dL (ref 1.5–2.5)

## 2017-10-21 MED ORDER — SODIUM CHLORIDE 0.9% FLUSH
10.0000 mL | INTRAVENOUS | Status: DC | PRN
  Filled 2017-10-21: qty 10

## 2017-10-21 MED ORDER — PALONOSETRON HCL INJECTION 0.25 MG/5ML
0.2500 mg | Freq: Once | INTRAVENOUS | Status: AC
  Administered 2017-10-21: 0.25 mg via INTRAVENOUS

## 2017-10-21 MED ORDER — FLUOROURACIL CHEMO INJECTION 2.5 GM/50ML
400.0000 mg/m2 | Freq: Once | INTRAVENOUS | Status: AC
  Administered 2017-10-21: 650 mg via INTRAVENOUS
  Filled 2017-10-21: qty 13

## 2017-10-21 MED ORDER — PROCHLORPERAZINE EDISYLATE 5 MG/ML IJ SOLN
10.0000 mg | Freq: Once | INTRAMUSCULAR | Status: AC
  Administered 2017-10-21: 10 mg via INTRAVENOUS

## 2017-10-21 MED ORDER — MORPHINE SULFATE 15 MG PO TABS: 15.0000 mg | ORAL_TABLET | ORAL | 0 refills | Status: DC | PRN

## 2017-10-21 MED ORDER — DEXAMETHASONE SODIUM PHOSPHATE 10 MG/ML IJ SOLN
10.0000 mg | Freq: Once | INTRAMUSCULAR | Status: AC
  Administered 2017-10-21: 10 mg via INTRAVENOUS

## 2017-10-21 MED ORDER — ATROPINE SULFATE 1 MG/ML IJ SOLN
0.5000 mg | Freq: Once | INTRAMUSCULAR | Status: AC | PRN
  Administered 2017-10-21: 1 mg via INTRAVENOUS

## 2017-10-21 MED ORDER — PROCHLORPERAZINE EDISYLATE 5 MG/ML IJ SOLN
INTRAMUSCULAR | Status: AC
  Filled 2017-10-21: qty 2

## 2017-10-21 MED ORDER — OMEPRAZOLE 20 MG PO CPDR: 20.0000 mg | DELAYED_RELEASE_CAPSULE | Freq: Every day | ORAL | 5 refills | Status: AC

## 2017-10-21 MED ORDER — HEPARIN SOD (PORK) LOCK FLUSH 100 UNIT/ML IV SOLN
500.0000 [IU] | Freq: Once | INTRAVENOUS | Status: DC | PRN
  Filled 2017-10-21: qty 5

## 2017-10-21 MED ORDER — SODIUM CHLORIDE 0.9 % IV SOLN
5.8000 mg/kg | Freq: Once | INTRAVENOUS | Status: AC
  Administered 2017-10-21: 300 mg via INTRAVENOUS
  Filled 2017-10-21: qty 15

## 2017-10-21 MED ORDER — DEXAMETHASONE SODIUM PHOSPHATE 10 MG/ML IJ SOLN
INTRAMUSCULAR | Status: AC
  Filled 2017-10-21: qty 1

## 2017-10-21 MED ORDER — ATROPINE SULFATE 1 MG/ML IJ SOLN
INTRAMUSCULAR | Status: AC
  Filled 2017-10-21: qty 1

## 2017-10-21 MED ORDER — PALONOSETRON HCL INJECTION 0.25 MG/5ML
INTRAVENOUS | Status: AC
  Filled 2017-10-21: qty 5

## 2017-10-21 MED ORDER — LEUCOVORIN CALCIUM INJECTION 350 MG
400.0000 mg/m2 | Freq: Once | INTRAVENOUS | Status: AC
  Administered 2017-10-21: 648 mg via INTRAVENOUS
  Filled 2017-10-21: qty 32.4

## 2017-10-21 MED ORDER — SODIUM CHLORIDE 0.9 % IV SOLN
2400.0000 mg/m2 | INTRAVENOUS | Status: DC
  Administered 2017-10-21: 3900 mg via INTRAVENOUS
  Filled 2017-10-21: qty 78

## 2017-10-21 MED ORDER — IRINOTECAN HCL CHEMO INJECTION 100 MG/5ML
180.0000 mg/m2 | Freq: Once | INTRAVENOUS | Status: AC
  Administered 2017-10-21: 300 mg via INTRAVENOUS
  Filled 2017-10-21: qty 15

## 2017-10-21 MED ORDER — SODIUM CHLORIDE 0.9 % IV SOLN
Freq: Once | INTRAVENOUS | Status: AC
  Administered 2017-10-21: 10:00:00 via INTRAVENOUS

## 2017-10-21 MED ORDER — SODIUM CHLORIDE 0.9 % IJ SOLN
10.0000 mL | INTRAMUSCULAR | Status: DC | PRN
  Administered 2017-10-21: 10 mL via INTRAVENOUS
  Filled 2017-10-21: qty 10

## 2017-10-21 NOTE — Progress Notes (Signed)
[facility] Spiritual Care Note  Visited with Carolyn Barton") and her friend [name] in infusion per referral from pt's brother [name], who is a chaplain resident at [facility]. [name] was very welcoming of [name], reports good support from an active community of friends, and is working to maintain a positive attitude in the midst of significant discomfort and disappointment this week. She plans to phone me to schedule a 1:1 appt in my office for a private opportunity to share and process in more detail.   [place], [place], [facility] Pager [phone] Voicemail [phone]

## 2017-10-21 NOTE — Patient Instructions (Addendum)
[facility] Discharge Instructions for Patients Receiving Chemotherapy  Today you received the following chemotherapy agents Irinotecan,Leucovorin, Adrucil, Vectibix  To help prevent nausea and vomiting after your treatment, we encourage you to take your nausea medication as directed C AP TAKE HOME [id number]}   If you develop nausea and vomiting that is not controlled by your nausea medication, call the clinic.   BELOW ARE SYMPTOMS THAT SHOULD BE REPORTED IMMEDIATELY:  *FEVER GREATER THAN 100.5 F  *CHILLS WITH OR WITHOUT FEVER  NAUSEA AND VOMITING THAT IS NOT CONTROLLED WITH YOUR NAUSEA MEDICATION  *UNUSUAL SHORTNESS OF BREATH  *UNUSUAL BRUISING OR BLEEDING  TENDERNESS IN MOUTH AND THROAT WITH OR WITHOUT PRESENCE OF ULCERS  *URINARY PROBLEMS  *BOWEL PROBLEMS  UNUSUAL RASH Items with * indicate a potential emergency and should be followed up as soon as possible.  Feel free to call the clinic should you have any questions or concerns. The clinic phone number is [phone].  Please show the [place] at check-in to the Emergency Department and triage nurse.   Irinotecan injection What is this medicine? IRINOTECAN (ir in oh TEE kan ) is a chemotherapy drug. It is used to treat colon and rectal cancer. This medicine may be used for other purposes; ask your health care provider or pharmacist if you have questions. COMMON BRAND NAME(S): Camptosar What should I tell my health care provider before I take this medicine? They need to know if you have any of these conditions: -blood disorders -dehydration -diarrhea -infection (especially a virus infection such as chickenpox, cold sores, or herpes) -liver disease -low blood counts, like low white cell, platelet, or red cell counts -recent or ongoing radiation therapy -an unusual or allergic reaction to irinotecan, sorbitol, other chemotherapy, other medicines, foods, dyes, or preservatives -pregnant  or trying to get pregnant -breast-feeding How should I use this medicine? This drug is given as an infusion into a vein. It is administered in a hospital or clinic by a specially trained health care professional. Talk to your pediatrician regarding the use of this medicine in children. Special care may be needed. Overdosage: If you think you have taken too much of this medicine contact a poison control center or emergency room at once. NOTE: This medicine is only for you. Do not share this medicine with others. What if I miss a dose? It is important not to miss your dose. Call your doctor or health care professional if you are unable to keep an appointment. What may interact with this medicine? Do not take this medicine with any of the following medications: -atazanavir -certain medicines for fungal infections like itraconazole and ketoconazole -St. John's Wort This medicine may also interact with the following medications: -dexamethasone -diuretics -laxatives -medicines for seizures like carbamazepine, mephobarbital, phenobarbital, phenytoin, primidone -medicines to increase blood counts like filgrastim, pegfilgrastim, sargramostim -prochlorperazine -vaccines This list may not describe all possible interactions. Give your health care provider a list of all the medicines, herbs, non-prescription drugs, or dietary supplements you use. Also tell them if you smoke, drink alcohol, or use illegal drugs. Some items may interact with your medicine. What should I watch for while using this medicine? Your condition will be monitored carefully while you are receiving this medicine. You will need important blood work done while you are taking this medicine. This drug may make you feel generally unwell. This is not uncommon, as chemotherapy can affect healthy cells as well as cancer cells. Report any side effects. Continue  your course of treatment even though you feel ill unless your doctor tells you to  stop. In some cases, you may be given additional medicines to help with side effects. Follow all directions for their use. You may get drowsy or dizzy. Do not drive, use machinery, or do anything that needs mental alertness until you know how this medicine affects you. Do not stand or sit up quickly, especially if you are an older patient. This reduces the risk of dizzy or fainting spells. Call your doctor or health care professional for advice if you get a fever, chills or sore throat, or other symptoms of a cold or flu. Do not treat yourself. This drug decreases your body's ability to fight infections. Try to avoid being around people who are sick. This medicine may increase your risk to bruise or bleed. Call your doctor or health care professional if you notice any unusual bleeding. Be careful brushing and flossing your teeth or using a toothpick because you may get an infection or bleed more easily. If you have any dental work done, tell your dentist you are receiving this medicine. Avoid taking products that contain aspirin, acetaminophen, ibuprofen, naproxen, or ketoprofen unless instructed by your doctor. These medicines may hide a fever. Do not become pregnant while taking this medicine. Women should inform their doctor if they wish to become pregnant or think they might be pregnant. There is a potential for serious side effects to an unborn child. Talk to your health care professional or pharmacist for more information. Do not breast-feed an infant while taking this medicine. What side effects may I notice from receiving this medicine? Side effects that you should report to your doctor or health care professional as soon as possible: -allergic reactions like skin rash, itching or hives, swelling of the face, lips, or tongue -low blood counts - this medicine may decrease the number of white blood cells, red blood cells and platelets. You may be at increased risk for infections and  bleeding. -signs of infection - fever or chills, cough, sore throat, pain or difficulty passing urine -signs of decreased platelets or bleeding - bruising, pinpoint red spots on the skin, black, tarry stools, blood in the urine -signs of decreased red blood cells - unusually weak or tired, fainting spells, lightheadedness -breathing problems -chest pain -diarrhea -feeling faint or lightheaded, falls -flushing, runny nose, sweating during infusion -mouth sores or pain -pain, swelling, redness or irritation where injected -pain, swelling, warmth in the leg -pain, tingling, numbness in the hands or feet -problems with balance, talking, walking -stomach cramps, pain -trouble passing urine or change in the amount of urine -vomiting as to be unable to hold down drinks or food -yellowing of the eyes or skin Side effects that usually do not require medical attention (report to your doctor or health care professional if they continue or are bothersome): -constipation -hair loss -headache -loss of appetite -nausea, vomiting -stomach upset This list may not describe all possible side effects. Call your doctor for medical advice about side effects. You may report side effects to FDA at 1-800-FDA-1088. Where should I keep my medicine? This drug is given in a hospital or clinic and will not be stored at home. NOTE: This sheet is a summary. It may not cover all possible information. If you have questions about this medicine, talk to your doctor, pharmacist, or health care provider.  2018 Elsevier/Gold Standard (2013-03-14 16:29:32)   Leucovorin injection What is this medicine? LEUCOVORIN (loo koe VOR  in) is used to prevent or treat the harmful effects of some medicines. This medicine is used to treat anemia caused by a low amount of folic acid in the body. It is also used with 5-fluorouracil (5-FU) to treat colon cancer. This medicine may be used for other purposes; ask your health care provider  or pharmacist if you have questions. What should I tell my health care provider before I take this medicine? They need to know if you have any of these conditions: -anemia from low levels of vitamin B-12 in the blood -an unusual or allergic reaction to leucovorin, folic acid, other medicines, foods, dyes, or preservatives -pregnant or trying to get pregnant -breast-feeding How should I use this medicine? This medicine is for injection into a muscle or into a vein. It is given by a health care professional in a hospital or clinic setting. Talk to your pediatrician regarding the use of this medicine in children. Special care may be needed. Overdosage: If you think you have taken too much of this medicine contact a poison control center or emergency room at once. NOTE: This medicine is only for you. Do not share this medicine with others. What if I miss a dose? This does not apply. What may interact with this medicine? -capecitabine -fluorouracil -phenobarbital -phenytoin -primidone -trimethoprim-sulfamethoxazole This list may not describe all possible interactions. Give your health care provider a list of all the medicines, herbs, non-prescription drugs, or dietary supplements you use. Also tell them if you smoke, drink alcohol, or use illegal drugs. Some items may interact with your medicine. What should I watch for while using this medicine? Your condition will be monitored carefully while you are receiving this medicine. This medicine may increase the side effects of 5-fluorouracil, 5-FU. Tell your doctor or health care professional if you have diarrhea or mouth sores that do not get better or that get worse. What side effects may I notice from receiving this medicine? Side effects that you should report to your doctor or health care professional as soon as possible: -allergic reactions like skin rash, itching or hives, swelling of the face, lips, or tongue -breathing problems -fever,  infection -mouth sores -unusual bleeding or bruising -unusually weak or tired Side effects that usually do not require medical attention (report to your doctor or health care professional if they continue or are bothersome): -constipation or diarrhea -loss of appetite -nausea, vomiting This list may not describe all possible side effects. Call your doctor for medical advice about side effects. You may report side effects to FDA at 1-800-FDA-1088. Where should I keep my medicine? This drug is given in a hospital or clinic and will not be stored at home. NOTE: This sheet is a summary. It may not cover all possible information. If you have questions about this medicine, talk to your doctor, pharmacist, or health care provider.  2018 Elsevier/Gold Standard (2008-03-21 16:50:29)   Fluorouracil, 5-FU injection What is this medicine? FLUOROURACIL, 5-FU (flure oh YOOR a sil) is a chemotherapy drug. It slows the growth of cancer cells. This medicine is used to treat many types of cancer like breast cancer, colon or rectal cancer, pancreatic cancer, and stomach cancer. This medicine may be used for other purposes; ask your health care provider or pharmacist if you have questions. COMMON BRAND NAME(S): Adrucil What should I tell my health care provider before I take this medicine? They need to know if you have any of these conditions: -blood disorders -dihydropyrimidine dehydrogenase (DPD) deficiency -infection (  especially a virus infection such as chickenpox, cold sores, or herpes) -kidney disease -liver disease -malnourished, poor nutrition -recent or ongoing radiation therapy -an unusual or allergic reaction to fluorouracil, other chemotherapy, other medicines, foods, dyes, or preservatives -pregnant or trying to get pregnant -breast-feeding How should I use this medicine? This drug is given as an infusion or injection into a vein. It is administered in a hospital or clinic by a specially  trained health care professional. Talk to your pediatrician regarding the use of this medicine in children. Special care may be needed. Overdosage: If you think you have taken too much of this medicine contact a poison control center or emergency room at once. NOTE: This medicine is only for you. Do not share this medicine with others. What if I miss a dose? It is important not to miss your dose. Call your doctor or health care professional if you are unable to keep an appointment. What may interact with this medicine? -allopurinol -cimetidine -dapsone -digoxin -hydroxyurea -leucovorin -levamisole -medicines for seizures like ethotoin, fosphenytoin, phenytoin -medicines to increase blood counts like filgrastim, pegfilgrastim, sargramostim -medicines that treat or prevent blood clots like warfarin, enoxaparin, and dalteparin -methotrexate -metronidazole -pyrimethamine -some other chemotherapy drugs like busulfan, cisplatin, estramustine, vinblastine -trimethoprim -trimetrexate -vaccines Talk to your doctor or health care professional before taking any of these medicines: -acetaminophen -aspirin -ibuprofen -ketoprofen -naproxen This list may not describe all possible interactions. Give your health care provider a list of all the medicines, herbs, non-prescription drugs, or dietary supplements you use. Also tell them if you smoke, drink alcohol, or use illegal drugs. Some items may interact with your medicine. What should I watch for while using this medicine? Visit your doctor for checks on your progress. This drug may make you feel generally unwell. This is not uncommon, as chemotherapy can affect healthy cells as well as cancer cells. Report any side effects. Continue your course of treatment even though you feel ill unless your doctor tells you to stop. In some cases, you may be given additional medicines to help with side effects. Follow all directions for their use. Call your  doctor or health care professional for advice if you get a fever, chills or sore throat, or other symptoms of a cold or flu. Do not treat yourself. This drug decreases your body's ability to fight infections. Try to avoid being around people who are sick. This medicine may increase your risk to bruise or bleed. Call your doctor or health care professional if you notice any unusual bleeding. Be careful brushing and flossing your teeth or using a toothpick because you may get an infection or bleed more easily. If you have any dental work done, tell your dentist you are receiving this medicine. Avoid taking products that contain aspirin, acetaminophen, ibuprofen, naproxen, or ketoprofen unless instructed by your doctor. These medicines may hide a fever. Do not become pregnant while taking this medicine. Women should inform their doctor if they wish to become pregnant or think they might be pregnant. There is a potential for serious side effects to an unborn child. Talk to your health care professional or pharmacist for more information. Do not breast-feed an infant while taking this medicine. Men should inform their doctor if they wish to father a child. This medicine may lower sperm counts. Do not treat diarrhea with over the counter products. Contact your doctor if you have diarrhea that lasts more than 2 days or if it is severe and watery. This medicine can  make you more sensitive to the sun. Keep out of the sun. If you cannot avoid being in the sun, wear protective clothing and use sunscreen. Do not use sun lamps or tanning beds/booths. What side effects may I notice from receiving this medicine? Side effects that you should report to your doctor or health care professional as soon as possible: -allergic reactions like skin rash, itching or hives, swelling of the face, lips, or tongue -low blood counts - this medicine may decrease the number of white blood cells, red blood cells and platelets. You may be  at increased risk for infections and bleeding. -signs of infection - fever or chills, cough, sore throat, pain or difficulty passing urine -signs of decreased platelets or bleeding - bruising, pinpoint red spots on the skin, black, tarry stools, blood in the urine -signs of decreased red blood cells - unusually weak or tired, fainting spells, lightheadedness -breathing problems -changes in vision -chest pain -mouth sores -nausea and vomiting -pain, swelling, redness at site where injected -pain, tingling, numbness in the hands or feet -redness, swelling, or sores on hands or feet -stomach pain -unusual bleeding Side effects that usually do not require medical attention (report to your doctor or health care professional if they continue or are bothersome): -changes in finger or toe nails -diarrhea -dry or itchy skin -hair loss -headache -loss of appetite -sensitivity of eyes to the light -stomach upset -unusually teary eyes This list may not describe all possible side effects. Call your doctor for medical advice about side effects. You may report side effects to FDA at 1-800-FDA-1088. Where should I keep my medicine? This drug is given in a hospital or clinic and will not be stored at home. NOTE: This sheet is a summary. It may not cover all possible information. If you have questions about this medicine, talk to your doctor, pharmacist, or health care provider.  2018 Elsevier/Gold Standard (2008-01-19 13:53:16)   Panitumumab Solution for Injection What is this medicine? PANITUMUMAB (pan i TOOM ue mab) is a monoclonal antibody. It is used to treat colorectal cancer. This medicine may be used for other purposes; ask your health care provider or pharmacist if you have questions. COMMON BRAND NAME(S): Vectibix What should I tell my health care provider before I take this medicine? They need to know if you have any of these conditions: -eye disease, vision problems -low levels of  calcium, magnesium, or potassium in the blood -lung or breathing disease, like asthma -skin conditions or sensitivity -an unusual or allergic reaction to panitumumab, other medicines, foods, dyes, or preservatives -pregnant or trying to get pregnant -breast-feeding How should I use this medicine? This drug is given as an infusion into a vein. It is administered in a hospital or clinic by a specially trained health care professional. Talk to your pediatrician regarding the use of this medicine in children. Special care may be needed. Overdosage: If you think you have taken too much of this medicine contact a poison control center or emergency room at once. NOTE: This medicine is only for you. Do not share this medicine with others. What if I miss a dose? It is important not to miss your dose. Call your doctor or health care professional if you are unable to keep an appointment. What may interact with this medicine? Do not take this medicine with any of the following medications: -bevacizumab This list may not describe all possible interactions. Give your health care provider a list of all the medicines, herbs,  non-prescription drugs, or dietary supplements you use. Also tell them if you smoke, drink alcohol, or use illegal drugs. Some items may interact with your medicine. What should I watch for while using this medicine? Visit your doctor for checks on your progress. This drug may make you feel generally unwell. This is not uncommon, as chemotherapy can affect healthy cells as well as cancer cells. Report any side effects. Continue your course of treatment even though you feel ill unless your doctor tells you to stop. This medicine can make you more sensitive to the sun. Keep out of the sun while receiving this medicine and for 2 months after the last dose. If you cannot avoid being in the sun, wear protective clothing and use sunscreen. Do not use sun lamps or tanning beds/booths. In some  cases, you may be given additional medicines to help with side effects. Follow all directions for their use. Call your doctor or health care professional for advice if you get a fever, chills or sore throat, or other symptoms of a cold or flu. Do not treat yourself. This drug decreases your body's ability to fight infections. Try to avoid being around people who are sick. Avoid taking products that contain aspirin, acetaminophen, ibuprofen, naproxen, or ketoprofen unless instructed by your doctor. These medicines may hide a fever. Do not become pregnant while taking this medicine and for 2 months after the last dose. Women should inform their doctor if they wish to become pregnant or think they might be pregnant. There is a potential for serious side effects to an unborn child. Talk to your health care professional or pharmacist for more information. Do not breast-feed an infant while taking this medicine or for 2 months after the last dose. What side effects may I notice from receiving this medicine? Side effects that you should report to your doctor or health care professional as soon as possible: -allergic reactions like skin rash, itching or hives, swelling of the face, lips, or tongue -breathing problems -changes in vision -eye pain -fast, irregular heartbeat -fever, chills -mouth sores -red spots on the skin -redness, blistering, peeling or loosening of the skin, including inside the mouth -signs and symptoms of kidney injury like trouble passing urine or change in the amount of urine -signs and symptoms of low blood pressure like dizziness; feeling faint or lightheaded, falls; unusually weak or tired -signs of low calcium like fast heartbeat, muscle cramps or muscle pain; pain, tingling, numbness in the hands or feet; seizures -signs and symptoms of low magnesium like muscle cramps, pain, or weakness; tremors; seizures; or fast, irregular heartbeat -signs and symptoms of low potassium like  muscle cramps or muscle pain; chest pain; dizziness; feeling faint or lightheaded, falls; palpitations; breathing problems; or fast, irregular heartbeat -swelling of the ankles, feet, hands Side effects that usually do not require medical attention (report to your doctor or health care professional if they continue or are bothersome): -changes in skin like acne, cracks, skin dryness -diarrhea -eyelash growth -headache -mouth sores -nail changes -nausea, vomiting This list may not describe all possible side effects. Call your doctor for medical advice about side effects. You may report side effects to FDA at 1-800-FDA-1088. Where should I keep my medicine? This drug is given in a hospital or clinic and will not be stored at home. NOTE: This sheet is a summary. It may not cover all possible information. If you have questions about this medicine, talk to your doctor, pharmacist, or health care provider.  2018 Elsevier/Gold Standard (2016-04-04 16:45:04)

## 2017-10-21 NOTE — Progress Notes (Signed)
At approx 1337 pt c/o "I just dont feel right".   Upon questioning pt found to be "achey all over" and nauseated.  No SHOB, difficulty swallowing.  No itching, rash or hives.  VSS Irinotecan and leucovorin paused, NS to gravity started and pt given 0.5mg  IV atropine. Pt in no acute distress, sitting in chair having a conversation with her visitor.

## 2017-10-21 NOTE — Progress Notes (Signed)
[place] OFFICE PROGRESS NOTE   Diagnosis: Colon cancer  INTERVAL HISTORY:   Carolyn Barton returns as scheduled.  A Doppler of the left leg on 10/19/2017 was negative for DVT.  She continues to have pain in the left leg.  The pain is partially relieved with MSIR.  Objective:  Vital signs in last 24 hours:  Blood pressure (!) 137/95, pulse (!) 110, temperature 98.6 F (37 C), temperature source Oral, resp. rate 18, height '5\' 3"'$  (1.6 m), weight 138 lb 8 oz (62.8 kg), SpO2 100 %.    HEENT: No thrush or ulcers Resp: Lungs clear bilaterally Cardio: Regular rate and rhythm GI: No hepatomegaly, left mid abdomen colostomy, masslike fullness throughout the low abdomen Vascular: 2+ edema throughout the left leg    Portacath/PICC-without erythema  Lab Results:  Lab Results  Component Value Date   WBC 12.4 (H) 10/21/2017   HGB 14.1 10/12/2017   HCT 40.3 10/21/2017   MCV 90.0 10/21/2017   PLT 643 (H) 10/21/2017   NEUTROABS 9.3 (H) 10/21/2017    CMP     Component Value Date/Time   NA 132 (L) 10/21/2017 0739   NA 139 09/21/2017 0746   K 4.1 10/21/2017 0739   K 3.3 (L) 09/21/2017 0746   CL 98 10/21/2017 0739   CL 101 12/01/2012 0924   CO2 25 10/21/2017 0739   CO2 28 09/21/2017 0746   GLUCOSE 111 10/21/2017 0739   GLUCOSE 92 09/21/2017 0746   GLUCOSE 94 12/01/2012 0924   BUN 13 10/21/2017 0739   BUN 9.3 09/21/2017 0746   CREATININE 0.75 10/12/2017 0957   CREATININE 0.6 09/21/2017 0746   CALCIUM 9.4 10/21/2017 0739   CALCIUM 8.7 09/21/2017 0746   PROT 7.0 10/21/2017 0739   PROT 6.2 (L) 09/21/2017 0746   ALBUMIN 2.9 (L) 10/21/2017 0739   ALBUMIN 3.2 (L) 09/21/2017 0746   AST 14 10/21/2017 0739   AST 19 09/21/2017 0746   ALT 11 10/21/2017 0739   ALT 12 09/21/2017 0746   ALKPHOS 107 10/21/2017 0739   ALKPHOS 105 09/21/2017 0746   BILITOT 0.5 10/21/2017 0739   BILITOT 0.41 09/21/2017 0746   GFRNONAA >60 10/21/2017 0739   GFRAA >60 10/21/2017 0739     Lab Results  Component Value Date   CEA1 1.32 10/12/2017     Medications: I have reviewed the patient's current medications.   Assessment/Plan: 1.Metastatic colorectal cancer-status post an exploratory laparotomy 03/03/2016 revealing a proximal rectal mass, peritoneal carcinomatosis, and liver metastases  Biopsy of peritoneal nodules 03/03/2016 confirmed metastatic adenocarcinoma consistent with a colon primary  Foundation 1 testing-MSI-stable, tumor mutation burden-low, no BRAF NRAS or KRAS mutation  Cycle 1 FOLFIRI/PANITUMUMAB 04/14/2016  Cycle 2 FOLFIRI/panitumumab 04/28/2016  Cycle 3 FOLFIRI/panitumumab 05/12/2016  Cycle 4 FOLFIRI/panitumumab 05/27/2016  Cycle 5 FOLFIRI/panitumumab 06/09/2016  Restaging CT abdomen/pelvis 06/20/2016-no evidence of disease progression, decreased left hepatic lesion  Cycle 6 FOLFIRI/panitumumab 06/23/2016  Cycle 7 FOLFIRI/panitumumab 07/07/2016  Cycle 8 FOLFIRI/panitumumab 07/21/2016  Cycle 9 FOLFIRI/PANITUMUMAB 08/04/2016  Cycle 10 FOLFIRI/panitumumab 08/18/2016  Restaging CT 08/29/2016 with interval decrease in the size of the medial segment left liver lesion. Lesion identified previously in the rectosigmoid colon not evident on the current study.  5-FU/PANITUMUMAB 09/01/2016  Xeloda 7 days on/7 days off and PANITUMUMAB every 3 weeks 09/15/2016 (awaiting insurance approval for Xeloda 09/15/2016)  CT abdomen/pelvis 12/25/2016-unchanged 5 mm right hepatic lesion, resolution of medial segment left liver lesion, no new liver lesion. Residual soft tissue fullness in the sigmoid ,  persistent distal esophageal wall thickening  Continuationof PANITUMUMAB every 3 weeks and Xeloda 7 days on/7 days off  CT 06/19/2017-new cystic/solid lesion in the left adnexa  Xeloda/panitumumab continued  CT 10/08/2016-progression of cystic pelvic mass  Cycle 1 FOLFIRI/Panitumumab 10/21/2017  2.History of aBowel obstruction secondary to  #1  3. Chronic back pain  4. Essential thrombocytosis  5. Early obstruction of the left ureter noted at the time of surgery 03/03/2016-Dr. Tannenbaum placed a left double-J stent 03/18/2016  New onset right hydronephrosis 10/11/2017  Right ureter stent placement 10/13/2017  6. Abdominal wall cellulitis 03/10/2016, blood cultures positive for coagulase negative staphylococcus-methicillin-resistant, status post treatment with vancomycin and Bactrim  7. Tachycardia-persistent following discharge from the hospital, likely related to anemia and deconditioning  CT chest 04/04/2016-negative for pulmonary embolism.  8. Port-A-Cath placement 04/10/2016, interventional radiology  9. Iron deficiency anemia. Feraheme 11/05/2015 , 11/12/2015,and 04/14/2016-persistent anemia and red cell microcytosis, oral iron resumed 11/17/2016-stable  10. Pain/tenderness left posterior iliac region-resolved  11. Hypokalemia-started on potassium replacement 06/09/2016  12. Skin rashand paronychiasecondary to [facility], she continues minocycline, moisturizers, and fluticasone  13.Acute onset right low back pain 10/11/2017-likely secondary to obstruction of the right kidney; left ureter stent exchange and right ureter stent placement 10/13/2017  14.  Left leg pain and edema-bilateral lower extremity venous Doppler negative for DVT.  The pain and swelling are likely due to venous/lymphatic obstruction from tumor.   [name] has metastatic colon cancer.  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  She is symptomatic with left leg swelling and pain.  The swelling may be related to venous/lymphatic compression from the pelvic mass.  She will begin salvage systemic therapy with FOLFIRI/Panitumumab today.  She will continue MSIR as needed for pain.  I reviewed the case with interventional radiology.  She will be  scheduled for a CT aspiration of the pelvic mass in an attempt to relieve the pelvic pressure and left leg pain.  30 minutes were spent with the patient today.  The majority of the time was used for counseling and coordination of care.  [name] Coder, MD  10/21/2017  1:33 PM

## 2017-10-21 NOTE — Progress Notes (Signed)
Around 1430 pt c/o nausea, pt denies pain and SOB,infusion was paused and Dr. Benay Spice ordered 10 mg of compazine IVP. After 5 minutes pt states "I feel better" infusion was resumed. Visitor at chairside.    Discharge instruction given and reviewed with the pt. Pt verbalized understanding.

## 2017-10-21 NOTE — Telephone Encounter (Signed)
Patient scheduled and given AVS/Calendar per 1/22 los

## 2017-10-22 ENCOUNTER — Other Ambulatory Visit: Payer: Self-pay

## 2017-10-22 ENCOUNTER — Emergency Department (HOSPITAL_COMMUNITY): Payer: BLUE CROSS/BLUE SHIELD

## 2017-10-22 ENCOUNTER — Telehealth: Payer: Self-pay | Admitting: *Deleted

## 2017-10-22 ENCOUNTER — Encounter (HOSPITAL_COMMUNITY): Payer: Self-pay

## 2017-10-22 ENCOUNTER — Inpatient Hospital Stay (HOSPITAL_COMMUNITY)
Admission: EM | Admit: 2017-10-22 | Discharge: 2017-10-24 | DRG: 176 | Disposition: A | Discharge status: Home / Self Care | Payer: BLUE CROSS/BLUE SHIELD | Attending: Family Medicine | Admitting: Family Medicine

## 2017-10-22 DIAGNOSIS — M7989 Other specified soft tissue disorders: Secondary | ICD-10-CM

## 2017-10-22 DIAGNOSIS — Z8249 Family history of ischemic heart disease and other diseases of the circulatory system: Secondary | ICD-10-CM

## 2017-10-22 DIAGNOSIS — C19 Malignant neoplasm of rectosigmoid junction: Secondary | ICD-10-CM

## 2017-10-22 DIAGNOSIS — Z808 Family history of malignant neoplasm of other organs or systems: Secondary | ICD-10-CM

## 2017-10-22 DIAGNOSIS — I2699 Other pulmonary embolism without acute cor pulmonale: Principal | ICD-10-CM

## 2017-10-22 DIAGNOSIS — C787 Secondary malignant neoplasm of liver and intrahepatic bile duct: Secondary | ICD-10-CM | POA: Diagnosis present

## 2017-10-22 DIAGNOSIS — Z87891 Personal history of nicotine dependence: Secondary | ICD-10-CM

## 2017-10-22 DIAGNOSIS — D509 Iron deficiency anemia, unspecified: Secondary | ICD-10-CM | POA: Diagnosis present

## 2017-10-22 DIAGNOSIS — Z86711 Personal history of pulmonary embolism: Secondary | ICD-10-CM

## 2017-10-22 DIAGNOSIS — Z85038 Personal history of other malignant neoplasm of large intestine: Secondary | ICD-10-CM

## 2017-10-22 DIAGNOSIS — Z9221 Personal history of antineoplastic chemotherapy: Secondary | ICD-10-CM

## 2017-10-22 DIAGNOSIS — C786 Secondary malignant neoplasm of retroperitoneum and peritoneum: Secondary | ICD-10-CM | POA: Diagnosis present

## 2017-10-22 DIAGNOSIS — Z85048 Personal history of other malignant neoplasm of rectum, rectosigmoid junction, and anus: Secondary | ICD-10-CM

## 2017-10-22 DIAGNOSIS — G8929 Other chronic pain: Secondary | ICD-10-CM | POA: Diagnosis present

## 2017-10-22 DIAGNOSIS — Z86718 Personal history of other venous thrombosis and embolism: Secondary | ICD-10-CM

## 2017-10-22 DIAGNOSIS — Z933 Colostomy status: Secondary | ICD-10-CM

## 2017-10-22 DIAGNOSIS — D473 Essential (hemorrhagic) thrombocythemia: Secondary | ICD-10-CM | POA: Diagnosis present

## 2017-10-22 DIAGNOSIS — I10 Essential (primary) hypertension: Secondary | ICD-10-CM | POA: Diagnosis present

## 2017-10-22 DIAGNOSIS — R0602 Shortness of breath: Secondary | ICD-10-CM | POA: Diagnosis not present

## 2017-10-22 HISTORY — DX: Personal history of pulmonary embolism: Z86.711

## 2017-10-22 HISTORY — DX: Personal history of other venous thrombosis and embolism: Z86.718

## 2017-10-22 LAB — I-STAT CHEM 8, ED
BUN: 21 mg/dL — AB (ref 6–20)
CHLORIDE: 99 mmol/L — AB (ref 101–111)
CREATININE: 0.8 mg/dL (ref 0.44–1.00)
Calcium, Ion: 1.15 mmol/L (ref 1.15–1.40)
GLUCOSE: 100 mg/dL — AB (ref 65–99)
HEMATOCRIT: 47 % — AB (ref 36.0–46.0)
Hemoglobin: 16 g/dL — ABNORMAL HIGH (ref 12.0–15.0)
POTASSIUM: 4.2 mmol/L (ref 3.5–5.1)
Sodium: 134 mmol/L — ABNORMAL LOW (ref 135–145)
TCO2: 26 mmol/L (ref 22–32)

## 2017-10-22 LAB — BASIC METABOLIC PANEL
Anion gap: 12 (ref 5–15)
BUN: 20 mg/dL (ref 6–20)
CHLORIDE: 98 mmol/L — AB (ref 101–111)
CO2: 25 mmol/L (ref 22–32)
CREATININE: 0.89 mg/dL (ref 0.44–1.00)
Calcium: 9.4 mg/dL (ref 8.9–10.3)
GFR calc Af Amer: >60 mL/min (ref >60)
GFR calc non Af Amer: >60 mL/min (ref >60)
GLUCOSE: 100 mg/dL — AB (ref 65–99)
POTASSIUM: 4 mmol/L (ref 3.5–5.1)
SODIUM: 135 mmol/L (ref 135–145)

## 2017-10-22 LAB — CBC WITH DIFFERENTIAL/PLATELET
BASOS ABS: 0 10*3/uL (ref 0.0–0.1)
Basophils Relative: 0 %
EOS PCT: 1 %
Eosinophils Absolute: 0.1 10*3/uL (ref 0.0–0.7)
HEMATOCRIT: 43.1 % (ref 36.0–46.0)
Hemoglobin: 14.2 g/dL (ref 12.0–15.0)
LYMPHS ABS: 1.2 10*3/uL (ref 0.7–4.0)
LYMPHS PCT: 11 %
MCH: 29.2 pg (ref 26.0–34.0)
MCHC: 32.9 g/dL (ref 30.0–36.0)
MCV: 88.7 fL (ref 78.0–100.0)
MONO ABS: 0.6 10*3/uL (ref 0.1–1.0)
Monocytes Relative: 6 %
NEUTROS ABS: 8.8 10*3/uL — AB (ref 1.7–7.7)
Neutrophils Relative %: 82 %
PLATELETS: 783 10*3/uL — AB (ref 150–400)
RBC: 4.86 MIL/uL (ref 3.87–5.11)
RDW: 16 % — AB (ref 11.5–15.5)
WBC: 10.8 10*3/uL — ABNORMAL HIGH (ref 4.0–10.5)

## 2017-10-22 LAB — TROPONIN I

## 2017-10-22 LAB — I-STAT TROPONIN, ED: Troponin i, poc: 0.01 ng/mL (ref 0.00–0.08)

## 2017-10-22 MED ORDER — POLYETHYLENE GLYCOL 3350 17 G PO PACK: 17.0000 g | PACK | Freq: Every day | ORAL | Status: DC | PRN

## 2017-10-22 MED ORDER — MORPHINE SULFATE 15 MG PO TABS
15.0000 mg | ORAL_TABLET | ORAL | Status: DC | PRN
Start: 2017-10-22 — End: 2017-10-24
  Administered 2017-10-23 – 2017-10-24 (×6): 30 mg via ORAL
  Filled 2017-10-22 (×6): qty 2

## 2017-10-22 MED ORDER — POTASSIUM CHLORIDE ER 10 MEQ PO TBCR
10.0000 meq | EXTENDED_RELEASE_TABLET | Freq: Two times a day (BID) | ORAL | Status: DC
  Administered 2017-10-22 – 2017-10-24 (×3): 10 meq via ORAL
  Filled 2017-10-22 (×8): qty 1

## 2017-10-22 MED ORDER — ONDANSETRON HCL 4 MG/2ML IJ SOLN
4.0000 mg | Freq: Four times a day (QID) | INTRAMUSCULAR | Status: DC | PRN
  Administered 2017-10-23: 4 mg via INTRAVENOUS
  Filled 2017-10-22: qty 2

## 2017-10-22 MED ORDER — ACETAMINOPHEN 325 MG PO TABS
650.0000 mg | ORAL_TABLET | Freq: Four times a day (QID) | ORAL | Status: DC | PRN
  Administered 2017-10-23: 650 mg via ORAL
  Filled 2017-10-22: qty 2

## 2017-10-22 MED ORDER — HEPARIN BOLUS VIA INFUSION
4000.0000 [IU] | Freq: Once | INTRAVENOUS | Status: AC
  Administered 2017-10-22: 4000 [IU] via INTRAVENOUS
  Filled 2017-10-22: qty 4000

## 2017-10-22 MED ORDER — SORBITOL 70 % SOLN
30.0000 mL | Freq: Every day | Status: DC
  Filled 2017-10-22 (×2): qty 30

## 2017-10-22 MED ORDER — ONDANSETRON HCL 4 MG PO TABS
4.0000 mg | ORAL_TABLET | Freq: Four times a day (QID) | ORAL | Status: DC | PRN
  Administered 2017-10-24: 4 mg via ORAL
  Filled 2017-10-22: qty 1

## 2017-10-22 MED ORDER — MINOCYCLINE HCL 100 MG PO CAPS
100.0000 mg | ORAL_CAPSULE | Freq: Two times a day (BID) | ORAL | Status: DC
  Administered 2017-10-22 – 2017-10-24 (×4): 100 mg via ORAL
  Filled 2017-10-22 (×4): qty 1

## 2017-10-22 MED ORDER — IOPAMIDOL (ISOVUE-370) INJECTION 76%
100.0000 mL | Freq: Once | INTRAVENOUS | Status: AC | PRN
  Administered 2017-10-22: 100 mL via INTRAVENOUS

## 2017-10-22 MED ORDER — ACETAMINOPHEN 650 MG RE SUPP: 650.0000 mg | Freq: Four times a day (QID) | RECTAL | Status: DC | PRN

## 2017-10-22 MED ORDER — SORBITOL 70 % PO SOLN
30.0000 mL | Freq: Every day | ORAL | Status: DC
  Filled 2017-10-22: qty 30

## 2017-10-22 MED ORDER — HEPARIN (PORCINE) IN NACL 100-0.45 UNIT/ML-% IJ SOLN
1100.0000 [IU]/h | INTRAMUSCULAR | Status: DC
  Administered 2017-10-22: 1100 [IU]/h via INTRAVENOUS
  Filled 2017-10-22: qty 250

## 2017-10-22 MED ORDER — SODIUM CHLORIDE 0.9 % IV SOLN
INTRAVENOUS | Status: AC
  Administered 2017-10-22: 22:00:00 via INTRAVENOUS

## 2017-10-22 MED ORDER — FERROUS SULFATE 325 (65 FE) MG PO TABS
325.0000 mg | ORAL_TABLET | Freq: Every day | ORAL | Status: DC
  Administered 2017-10-23 – 2017-10-24 (×2): 325 mg via ORAL
  Filled 2017-10-22 (×2): qty 1

## 2017-10-22 MED ORDER — DULOXETINE HCL 60 MG PO CPEP
60.0000 mg | ORAL_CAPSULE | Freq: Every evening | ORAL | Status: DC
  Administered 2017-10-22 – 2017-10-23 (×2): 60 mg via ORAL
  Filled 2017-10-22: qty 1
  Filled 2017-10-22: qty 2

## 2017-10-22 MED ORDER — HYDROMORPHONE HCL 2 MG PO TABS: 4.0000 mg | ORAL_TABLET | ORAL | Status: DC | PRN

## 2017-10-22 MED ORDER — PANTOPRAZOLE SODIUM 40 MG PO TBEC
40.0000 mg | DELAYED_RELEASE_TABLET | Freq: Every day | ORAL | Status: DC
  Administered 2017-10-23 – 2017-10-24 (×2): 40 mg via ORAL
  Filled 2017-10-22 (×2): qty 1

## 2017-10-22 MED ORDER — METHADONE HCL 5 MG PO TABS
10.0000 mg | ORAL_TABLET | Freq: Three times a day (TID) | ORAL | Status: DC
  Administered 2017-10-22 – 2017-10-24 (×5): 10 mg via ORAL
  Filled 2017-10-22 (×5): qty 2

## 2017-10-22 NOTE — Telephone Encounter (Signed)
TCT from patient. She states she is feeling slightly short of breath. Nothing in particular precipitates this. She feels whether she is sitting still or up and about in the house. Denies diaphoresis/chest pain. She states she is not in any distress-just concerned. Informed Dr. Gearldine Shown nurse about the above situation.  Pt desires call back from Dr. Gearldine Shown nurse.

## 2017-10-22 NOTE — ED Triage Notes (Signed)
Patient has increased SOB for the past 12 hours. Patient was sent by PCP. Patient is currently receiving chemo treatments. patient has increased swelling to the left leg.

## 2017-10-22 NOTE — Progress Notes (Signed)
[place] for heparin Indication: new pulmonary embolus  No Known Allergies  Patient Measurements: Height: 5\' 5"  (165.1 cm) Weight: 138 lb (62.6 kg) IBW/kg (Calculated) : 57 Heparin Dosing Weight: 62 kg  Vital Signs: Temp: 97.8 F (36.6 C) (01/24 1657) Temp Source: Oral (01/24 1657) BP: 152/98 (01/24 1950) Pulse Rate: 108 (01/24 1950)  Labs: Recent Labs    10/21/17 0739 10/22/17 1746 10/22/17 1759  HGB  --  14.2 16.0*  HCT 40.3 43.1 47.0*  PLT 643* 783*  --   CREATININE  --  0.89 0.80    Estimated Creatinine Clearance: 67.3 mL/min (by C-G formula based on SCr of 0.8 mg/dL).   Assessment: Patient's a 62 y.o F with metastatic colorectal cancer currently undergoing chemotherapy treatment, presented to the ED on 10/22/17 with c/o SOB and LE swelling.  Chest CTA showed bilateral PE (no evidence of right heart strain).  To start heparin for acute PE.  Goal of Therapy:  Heparin level 0.3-0.7 units/ml Monitor platelets by anticoagulation protocol: Yes   Plan:  - heparin 4000 units IV x1 bolus, then drip at 1100 units/hr - check 8 hr heparin level - daily cbc  - monitor for s/s bleeding  Clinton Dragone P 10/22/2017,8:14 PM

## 2017-10-22 NOTE — ED Provider Notes (Signed)
[place] DEPT Provider Note   CSN: [id number] Arrival date & time: 10/22/17  1631     History   Chief Complaint Chief Complaint  Patient presents with  . Shortness of Breath    HPI Carolyn Barton is a 62 y.o. female with a history of metastatic colorectal cancer with liver metastasis currently undergoing chemotherapy by Dr. Benay Spice & persistent tachycardia who presents ED today for shortness of breath.  Patient notes that she recently started a new for series of chemotherapy yesterday.  Since that time she has been having increasing shortness of breath with exertion and also with long periods of talking.  ~ 2 weeks ago she was able to walk several blocks without difficulty. Now she is SOB with minimal exertion. She denies any shortness breath at rest, orthopnea, cough, chest pain, chest tightness, nausea, diaphoresis, fever that is associated with this.  Patient does note that over the last 2 weeks she has had increasing left lower leg swelling and edema.  She has had 2 previous DVT studies that were negative.  She denies any recent trauma to the area, overlying erythema, decreased range of motion or penetrating trauma to the leg.  HPI  Past Medical History:  Diagnosis Date  . Chronic back pain    lumbar    . Colostomy in place [facility])   . 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 History of chemotherapy last chemo 09-21-17  . Hypokalemia    takes potassium  . Iron deficiency anemia 04/14/2016   treated w/ Iron infusions  . Liver metastasis ([place])    secondary to colorectal cancer  . Metastatic colorectal cancer [facility]) dx 03/03/2016--- oncologist-- dr Benay Spice   metastatic colorectal carcinoma--  s/p  exp. lap. for bowel obstruction--  rectal mass, peritoneal carcinomatosis, liver mets---  chemotherapy  . Spinal headache 12/21/2012  . Tachycardia    persistant since discharged from hospital 06/ 2017 due to anemia and  deconditioning;  as of 09-02-2016 per pt no issues w/ heart racing in the past few weeks  . Ureteral obstruction, left   . Wears contact lenses     Patient Active Problem List   Diagnosis Date Noted  . Port-A-Cath in place 07/27/2017  . Port catheter in place 04/14/2016  . Iron deficiency anemia 04/14/2016  . Rectal cancer ([place]) 03/20/2016  . Leukocytosis   . Cellulitis 03/10/2016  . Malnutrition of moderate degree 03/07/2016  . Metastatic colorectal cancer ([place]) 03/03/2016  . Bowel obstruction ([place]) 02/29/2016  . Mass of colon 02/29/2016  . Nausea & vomiting 02/29/2016  . Lumbar radiculopathy 01/26/2014  . Essential thrombocytosis ([place]) 11/10/2011    Past Surgical History:  Procedure Laterality Date  . BIOPSY N/A 03/03/2016   Procedure: BIOPSY OF PERITONEAL NODULE;  Surgeon: Erroll Luna, MD;  Location: [place];  Service: General;  Laterality: N/A;  . COLOSTOMY N/A 03/03/2016   Procedure: DIVERTING SIGMOID COLOSTOMY;  Surgeon: Erroll Luna, MD;  Location: [place];  Service: General;  Laterality: N/A;  . CYSTOSCOPY W/ RETROGRADES Right 10/13/2017   Procedure: CYSTOSCOPY WITH RETROGRADE PYELOGRAM/ STENT PLACEMENT;  Surgeon: Ceasar Mons, MD;  Location: [facility];  Service: Urology;  Laterality: Right;  ONLY NEEDS 30 MIN FOR BOTH PROCEDURES  . CYSTOSCOPY W/ URETERAL STENT PLACEMENT Left 10/09/2016   Procedure: CYSTOSCOPY WITH LEFT STENT REPLACEMENT 77F POLARIS STENT 24CM;  Surgeon: Carolan Clines, MD;  Location: [facility];  Service: Urology;  Laterality: Left;  .  CYSTOSCOPY W/ URETERAL STENT PLACEMENT Left 03/16/2017   Procedure: CYSTOSCOPY WITH RETROGRADE PYELOGRAM WITH LEFT  STENT REMOVAL AND REPLACEMENT;  Surgeon: Carolan Clines, MD;  Location: [facility];  Service: Urology;  Laterality: Left;  . CYSTOSCOPY W/ URETERAL STENT PLACEMENT Left 10/13/2017   Procedure: CYSTOSCOPY WITH STENT REPLACEMENT;  Surgeon: Ceasar Mons, MD;  Location[facility];  Service: Urology;  Laterality: Left;  . CYSTOSCOPY WITH FULGERATION N/A 10/13/2017   Procedure: CYSTOSCOPY WITH FULGERATION;  Surgeon: Ceasar Mons, MD;  Location: [facility];  Service: Urology;  Laterality: N/A;  . CYSTOSCOPY WITH RETROGRADE PYELOGRAM, URETEROSCOPY AND STENT PLACEMENT Left 03/18/2016   Procedure: CYSTOSCOPY WITH LEFT  RETROGRADE PYELOGRAM,  AND  POLARIS STENT PLACEMENT;  Surgeon: Carolan Clines, MD;  Location: [facility];  Service: Urology;  Laterality: Left;  . LAPAROTOMY N/A 03/03/2016   Procedure: EXPLORATORY LAPAROTOMY;  Surgeon: Erroll Luna, MD;  Location: [place];  Service: General;  Laterality: N/A;  . LUMBAR LAMINECTOMY  11/11/2012   L4-5  and Resection synovial cyst  . MAXIMUM ACCESS (MAS)POSTERIOR LUMBAR INTERBODY FUSION (PLIF) 1 LEVEL N/A 01/11/2014   Procedure: FOR MAXIMUM ACCESS (MAS) POSTERIOR LUMBAR INTERBODY FUSION Lumbar Five Sacral One;  Surgeon: [name], MD;  Location: [place] [facility];  Service: Neurosurgery;  Laterality: N/A;  FOR MAXIMUM ACCESS (MAS) POSTERIOR LUMBAR INTERBODY FUSION Lumbar Five Sacral One  . PORTACATH PLACEMENT  04/10/2016  . REPAIR CEREBROSPINAL FLUID LEAK POST RESECTION SYNOVIAL CYST  12/21/2012  . REVISION LUMBAR HARDWARE  01/26/2014  . TRANSTHORACIC ECHOCARDIOGRAM  06/17/2016   grade 1 diastolic function , ef 17-61%/  trivial TR  . WISDOM TOOTH EXTRACTION      OB History    No data available       Home Medications    Prior to Admission medications   Medication Sig Start Date End Date Taking? Authorizing Provider  capecitabine (XELODA) 500 MG tablet TAKE 3 TABLETS (1500 MG) IN AM AND 2 TABLETS (1000 MG) IN PM-TOTAL 2500 MG DAILY. TAKE ON DAYS 1-7 AND 15-21 OF EACH 28 DAY CYCLE. 08/24/17   Ladell Pier, MD  DULoxetine (CYMBALTA) 60 MG capsule Take 1 capsule (60 mg total) by mouth every evening. 06/15/17   Ladell Pier, MD  ferrous  sulfate 325 (65 FE) MG EC tablet Take 1 tablet (325 mg total) by mouth daily. 11/17/16   Ladell Pier, MD  fluticasone (CUTIVATE) 0.05 % cream Apply topically 2 (two) times daily. 11/17/16   Ladell Pier, MD  HYDROmorphone (DILAUDID) 4 MG tablet Take 4 mg by mouth every 4 (four) hours as needed for severe pain.     [provider]  ketorolac (TORADOL) 10 MG tablet Take 1 tablet (10 mg total) by mouth every 8 (eight) hours. for pain 10/12/17   Ladell Pier, MD  lidocaine-prilocaine (EMLA) cream Apply to port site one hour prior to use. Do not rub in. Cover with plastic. 03/02/17   Owens Shark, NP  loperamide (IMODIUM A-D) 2 MG capsule Take by mouth as needed for diarrhea or loose stools.    [provider]  Meth-Hyo-M Bl-Na Phos-Ph Sal (URIBEL) 118 MG CAPS Take 1 capsule (118 mg total) by mouth 3 times/day as needed-between meals & bedtime. Patient not taking: Reported on 10/11/2017 03/16/17   Carolan Clines, MD  methadone (DOLOPHINE) 10 MG tablet Take 10 mg by mouth 3 (three) times daily.     [provider]  minocycline (MINOCIN) 100 MG capsule Take 1 capsule (100 mg total) by mouth 2 (two) times daily. 12/08/16   Ladell Pier, MD  morphine (MSIR) 15 MG tablet Take 1-2 tablets (15-30 mg total) by mouth every 4 (four) hours as needed for severe pain. 10/21/17   Ladell Pier, MD  omeprazole (PRILOSEC) 20 MG capsule Take 1 capsule (20 mg total) by mouth daily. 10/21/17   Ladell Pier, MD  ondansetron (ZOFRAN) 8 MG tablet Take 1 tablet (8 mg total) by mouth every 8 (eight) hours as needed for nausea or vomiting. 04/24/17   Owens Shark, NP  phenazopyridine (PYRIDIUM) 200 MG tablet Take 1 tablet (200 mg total) by mouth 3 (three) times daily as needed for pain. 10/13/17 10/13/18  Ceasar Mons, MD  polyethylene glycol [facility] / Floria Raveling) packet Take 17 g by mouth daily as needed for mild constipation.     [provider]  potassium  chloride (K-DUR) 10 MEQ tablet Take 1 tablet (10 mEq total) by mouth 2 (two) times daily. 09/21/17   Ladell Pier, MD  prochlorperazine (COMPAZINE) 10 MG tablet Take 1 tablet (10 mg total) by mouth every 6 (six) hours as needed for nausea or vomiting. 09/15/16   Owens Shark, NP  sorbitol 70 % solution Take 30 cc twice today, then 30 cc daily 10/19/17   Owens Shark, NP    Family History Family History  Problem Relation Age of Onset  . Heart disease Mother   . Melanoma Mother   . Colon cancer Neg Hx   . Colon polyps Neg Hx   . Rectal cancer Neg Hx   . Stomach cancer Neg Hx   . Esophageal cancer Neg Hx     Social History Social History   Tobacco Use  . Smoking status: Former Smoker    Packs/day: 0.25    Years: 1.00    Pack years: 0.25    Types: Cigarettes    Last attempt to quit: 10/03/1978    Years since quitting: 39.0  . Smokeless tobacco: Never Used  Substance Use Topics  . Alcohol use: No    Alcohol/week: 0.0 oz  . Drug use: No     Allergies   Patient has no known allergies.   Review of Systems Review of Systems  All other systems reviewed and are negative.    Physical Exam Updated Vital Signs BP (!) 143/92 (BP Location: Left Arm)   Pulse (!) 109   Temp 97.8 F (36.6 C) (Oral)   Resp 18   Ht 5\' 3"  (1.6 m)   Wt 62.6 kg (138 lb)   SpO2 96%   BMI 24.45 kg/m   Physical Exam  Constitutional: She appears well-developed and well-nourished.  Frail appearing female  HENT:  Head: Normocephalic and atraumatic.  Right Ear: External ear normal.  Left Ear: External ear normal.  Nose: Nose normal.  Mouth/Throat: Uvula is midline, oropharynx is clear and moist and mucous membranes are normal. No tonsillar exudate.  Eyes: Pupils are equal, round, and reactive to light. Right eye exhibits no discharge. Left eye exhibits no discharge. No scleral icterus.  Neck: Trachea normal. Neck supple. No JVD present. No spinous process tenderness present. Carotid bruit  is not present. No neck rigidity. Normal range of motion present.  No nuchal rigidity or meningismus  Cardiovascular: Regular rhythm and intact distal pulses. Tachycardia present.  No murmur heard. Pulses:      Radial pulses are 2+ on the  right side, and 2+ on the left side.       Dorsalis pedis pulses are 2+ on the right side, and 2+ on the left side.       Posterior tibial pulses are 2+ on the right side, and 2+ on the left side.  2+ left sided lower extremity edema with ttp of the calf.   Pulmonary/Chest: Effort normal and breath sounds normal. She exhibits no tenderness.  No increased work of breathing. No accessory muscle use. Patient is sitting upright, speaking in full sentences without difficulty   Abdominal: Soft. Bowel sounds are normal. There is no tenderness. There is no rigidity, no rebound, no guarding and no CVA tenderness.  Midline incisional abdominal scar without evidence of infection.  Routine healing.  Patient with in place colostomy bag with normal drainage present.  Musculoskeletal: She exhibits no edema.       Left hip: She exhibits normal range of motion and no tenderness.       Left knee: She exhibits normal range of motion. No tenderness found.  Lymphadenopathy:    She has no cervical adenopathy.  Neurological: She is alert.  Speech clear. Follows commands. No facial droop. PERRLA. EOM grossly intact. CN III-XII grossly intact. Grossly moves all extremities 4 without ataxia. Able and appropriate strength for age to upper and lower extremities bilaterally including grip strength.   Skin: Skin is warm and dry. No rash noted. She is not diaphoretic.  Psychiatric: She has a normal mood and affect.  Nursing note and vitals reviewed.    ED Treatments / Results  Labs (all labs ordered are listed, but only abnormal results are displayed) Labs Reviewed  BASIC METABOLIC PANEL - Abnormal; Notable for the following components:      Result Value   Chloride 98 (*)     Glucose, Bld 100 (*)    All other components within normal limits  CBC WITH DIFFERENTIAL/PLATELET - Abnormal; Notable for the following components:   WBC 10.8 (*)    RDW 16.0 (*)    Platelets 783 (*)    Neutro Abs 8.8 (*)    All other components within normal limits  I-STAT CHEM 8, ED - Abnormal; Notable for the following components:   Sodium 134 (*)    Chloride 99 (*)    BUN 21 (*)    Glucose, Bld 100 (*)    Hemoglobin 16.0 (*)    HCT 47.0 (*)    All other components within normal limits  BRAIN NATRIURETIC PEPTIDE  HEPARIN LEVEL (UNFRACTIONATED)  CBC  HIV ANTIBODY (ROUTINE TESTING)  BASIC METABOLIC PANEL  TROPONIN I  I-STAT TROPONIN, ED    EKG  EKG Interpretation  Date/Time:  Thursday October 22 2017 17:05:39 EST Ventricular Rate:  108 PR Interval:    QRS Duration: 89 QT Interval:  327 QTC Calculation: 439 R Axis:   -26 Text Interpretation:  Sinus tachycardia Borderline left axis deviation similar to previous from 02/2016 Confirmed by Virgel Manifold [phone]) on 10/22/2017 5:27:35 PM       Radiology Dg Chest 2 View  Result Date: 10/22/2017 CLINICAL DATA:  Shortness of breath for several hours EXAM: CHEST  2 VIEW COMPARISON:  03/18/2016 FINDINGS: Cardiac shadow is within normal limits. Right-sided chest wall port is noted in satisfactory position. The lungs are well aerated bilaterally without focal infiltrate or sizable effusion. Bilateral ureteral stents are noted. No bony abnormality is seen. IMPRESSION: No active cardiopulmonary disease. Electronically Signed   By: Inez Catalina  M.D.   On: 10/22/2017 18:22   Ct Angio Chest Pe W/cm &/or Wo Cm  Result Date: 10/22/2017 CLINICAL DATA:  62 year old female with increase shortness of breath, metastatic colorectal cancer undergoing chemotherapy. EXAM: CT ANGIOGRAPHY CHEST WITH CONTRAST TECHNIQUE: Multidetector CT imaging of the chest was performed using the standard protocol during bolus administration of intravenous contrast.  Multiplanar CT image reconstructions and MIPs were obtained to evaluate the vascular anatomy. CONTRAST:  164mL ISOVUE-370 IOPAMIDOL (ISOVUE-370) INJECTION 76% COMPARISON:  CTA chest 04/04/2016 FINDINGS: Cardiovascular: Good contrast bolus timing in the pulmonary arterial tree. Positive bilateral pulmonary artery filling defects consistent with pulmonary emboli. Left upper and lower lobe involvement beginning at the left hilum. Similar right upper lobe, right middle lobe and lower lobe involvement. No saddle embolus. RV / LV ratio = 0.8 No pericardial effusion. Negative visible aorta. Right chest porta cath in place. Mediastinum/Nodes: No mediastinal lymphadenopathy. Moderate size gastric hiatal hernia is stable since 2017. Lungs/Pleura: Major airways are patent. No pleural effusion. No abnormal pulmonary opacity. Upper Abdomen: Partially visible left hydronephrosis and hydroureter with a left ureteral stent in place. Partially visible right hydronephrosis, with faintly visible proximal aspect of a right ureteral stent. No upper abdominal free fluid. Negative visible liver, gallbladder, spleen, pancreas, adrenal glands, and bowel in the upper abdomen. Musculoskeletal: Osteopenia. No acute osseous abnormality identified. Review of the MIP images confirms the above findings. IMPRESSION: 1. Positive for bilateral acute pulmonary emboli. Bilateral upper and lower lobe involvement, but no saddle embolus or CTA evidence of right heart strain. 2. No pleural effusion or pulmonary infarct. 3. Partially visible hydronephrosis and bilateral ureteral stents in the upper abdomen. Critical Value/emergent results were called by telephone at the time of interpretation on 10/22/2017 at 7:43 pm to Dr. Alferd Apa , who verbally acknowledged these results. Electronically Signed   By: Genevie Ann M.D.   On: 10/22/2017 19:48    Procedures Procedures (including critical care time)  Medications Ordered in ED Medications  heparin  ADULT infusion 100 units/mL (25000 units/268mL sodium chloride 0.45%) (1,100 Units/hr Intravenous New Bag/Given 10/22/17 2048)  DULoxetine (CYMBALTA) DR capsule 60 mg (not administered)  ferrous sulfate EC tablet 325 mg (not administered)  methadone (DOLOPHINE) tablet 10 mg (not administered)  minocycline (MINOCIN,DYNACIN) capsule 100 mg (not administered)  morphine (MSIR) tablet 15-30 mg (not administered)  pantoprazole (PROTONIX) EC tablet 40 mg (not administered)  polyethylene glycol (MIRALAX / GLYCOLAX) packet 17 g (not administered)  potassium chloride (K-DUR) CR tablet 10 mEq (not administered)  sorbitol 70 % solution 30 mL (not administered)  acetaminophen (TYLENOL) tablet 650 mg (not administered)    Or  acetaminophen (TYLENOL) suppository 650 mg (not administered)  ondansetron (ZOFRAN) tablet 4 mg (not administered)    Or  ondansetron (ZOFRAN) injection 4 mg (not administered)  0.9 %  sodium chloride infusion (not administered)  iopamidol (ISOVUE-370) 76 % injection 100 mL (100 mLs Intravenous Contrast Given 10/22/17 1916)  heparin bolus via infusion 4,000 Units (4,000 Units Intravenous Bolus from Bag 10/22/17 2048)     Initial Impression / Assessment and Plan / ED Course  I have reviewed the triage vital signs and the nursing notes.  Pertinent labs & imaging results that were available during my care of the patient were reviewed by me and considered in my medical decision making (see chart for details).     This is a 62 year old female with history of colorectal cancer and recent left lower leg swelling over the last 2 week the  presents for new shortness of breath with minimal exertion.  There is no associated chest pain with this.  On exam the patient is tachycardic.  On chart review the patient has persistent tachycardic since previous abdominal surgery.  She is without fever, tachypnea, hypoxia or hypotension.  She is satting at 100% on room air.  There is no chest tenderness  palpation.  Trachea is midline.  Heart is tachycardic with regular rhythm.  Lungs are clear to auscultation bilaterally.  Labs are reassuring.  Troponin 0.01.  No electrolyte imbalances.  Normal kidney function.  No anion gap acidosis.  Mild leukocytosis of 10.8 neutrophil shift of 8.8.  No anemia.  EKG was sinus tachycardia.  Chest x-ray unremarkable.  CTA PE study ordered given patient's history of cancer and recent left lower leg swelling with new shortness of breath.  CTA with bilateral acute pulmonary emboli of the upper and lower lobes.  There is no saddle embolus and no right heart strain.  No pleural effusion or pulmonary infarct.  Will start the patient on heparin per pharmacy.  On reevaluation the patient is satting at 100% on room air.  There is no increased work of breathing.  Patient admitted to hospitalist service under Dr. Hal Hope.  Patient made aware of findings and is in agreement with plan.   Final Clinical Impressions(s) / ED Diagnoses   Final diagnoses:  Bilateral pulmonary embolism ([place])  History of colorectal cancer  Left leg swelling    ED Discharge Orders    None       Lorelle Gibbs 10/22/17 2223    Orlie Dakin, MD 10/22/17 2329

## 2017-10-22 NOTE — Telephone Encounter (Signed)
-----   Message from [place], [place] sent at 10/21/2017  4:23 PM EST ----- Regarding: Dr. Benay Spice chemo follow up First time Irinotecan, leucovorin,Adrucil and vectibix, pt had episode of nausea during the infusion.

## 2017-10-22 NOTE — Telephone Encounter (Signed)
TCT patient. Spoke with her. Doing well s/p chemo yesterday. No side effects noted at this time.  She will come in for pump d/c tomorrow.

## 2017-10-22 NOTE — Telephone Encounter (Signed)
Discussed pt's call with Dr. Benay Spice: MD recommends she go to Emergency Dept for evaluation. Pt reports she initially noticed dyspnea with ambulation but pt became short of breath during phone call while sitting still. She will go to [facility] ED for evaluation.

## 2017-10-22 NOTE — ED Notes (Signed)
Bed: WA09 Expected date:  Expected time:  Means of arrival:  Comments: Hold for triage 1

## 2017-10-22 NOTE — ED Notes (Signed)
ED TO INPATIENT HANDOFF REPORT  Name/Age/Gender Carolyn Barton 62 y.o. female  Code Status    Code Status Orders  (From admission, onward)        Start     Ordered   10/22/17 2115  Full code  Continuous     10/22/17 2115    Code Status History    Date Active Date Inactive Code Status Order ID Comments User Context   02/29/2016 22:39 03/15/2016 16:28 Full Code [id number]  Ivor Costa, MD ED   01/26/2014 21:53 01/27/2014 13:05 Full Code [id number]  [name], MD Inpatient   01/11/2014 15:59 01/13/2014 19:11 Full Code [id number]  [name], MD Inpatient   12/18/2012 15:51 12/23/2012 15:47 Full Code [id number]  Winfield Cunas, MD Inpatient      Home/SNF/Other Home  Chief Complaint cancer pt sent by PCP shortness of breath  Level of Care/Admitting Diagnosis ED Disposition    ED Disposition Condition [place]: [facility] [[id number]]  Level of Care: Telemetry [5]  Admit to tele based on following criteria: Monitor for Ischemic changes  Diagnosis: Pulmonary embolism [facility]) [[id number]]  Admitting Physician: Rise Patience [phone]  Attending Physician: Rise Patience Lei.Right  PT Class (Do Not Modify): Observation [104]  PT Acc Code (Do Not Modify): Observation [10022]       Medical History Past Medical History:  Diagnosis Date  . Chronic back pain    lumbar    . Colostomy in place [facility])   . 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 History of chemotherapy last chemo 09-21-17  . Hypokalemia    takes potassium  . Iron deficiency anemia 04/14/2016   treated w/ Iron infusions  . Liver metastasis ([place])    secondary to colorectal cancer  . Metastatic colorectal cancer [facility]) dx 03/03/2016--- oncologist-- dr Benay Spice   metastatic colorectal carcinoma--  s/p  exp. lap. for bowel obstruction--  rectal mass, peritoneal carcinomatosis, liver mets---  chemotherapy  . Spinal headache 12/21/2012  . Tachycardia     persistant since discharged from hospital 06/ 2017 due to anemia and deconditioning;  as of 09-02-2016 per pt no issues w/ heart racing in the past few weeks  . Ureteral obstruction, left   . Wears contact lenses     Allergies No Known Allergies  IV Location/Drains/Wounds Patient Lines/Drains/Airways Status   Active Line/Drains/Airways    Name:   Placement date:   Placement time:   Site:   Days:   Implanted Port 04/10/16 Right Chest   04/10/16    1417    Chest   560   Peripheral IV 10/22/17 Left Antecubital   10/22/17    1838    Antecubital   less than 1   Peripheral IV 10/22/17 Right Antecubital   10/22/17    1839    Antecubital   less than 1   Urethral Catheter D. Zenia Resides, RN Latex 16 Fr.   10/13/17    1400    Latex   9   Ureteral Drain/Stent Left ureter 8 Fr.   10/13/17    1055    Left ureter   9   Ureteral Drain/Stent Right ureter 6 Fr.   10/13/17    1128    Right ureter   9   Incision (Closed) 10/09/16 Perineum Other (Comment)   10/09/16    1053     378   Incision (Closed) 03/16/17 Perineum Other (Comment)   03/16/17  1136     220   Incision (Closed) 10/13/17 Vagina Other (Comment)   10/13/17    0957     9          Labs/Imaging Results for orders placed or performed during the hospital encounter of 10/22/17 (from the past 48 hour(s))  Basic metabolic panel     Status: Abnormal   Collection Time: 10/22/17  5:46 PM  Result Value Ref Range   Sodium 135 135 - 145 mmol/L   Potassium 4.0 3.5 - 5.1 mmol/L   Chloride 98 (L) 101 - 111 mmol/L   CO2 25 22 - 32 mmol/L   Glucose, Bld 100 (H) 65 - 99 mg/dL   BUN 20 6 - 20 mg/dL   Creatinine, Ser 0.89 0.44 - 1.00 mg/dL   Calcium 9.4 8.9 - 10.3 mg/dL   GFR calc non Af Amer >60 >60 mL/min   GFR calc Af Amer >60 >60 mL/min    Comment: (NOTE) The eGFR has been calculated using the CKD EPI equation. This calculation has not been validated in all clinical situations. eGFR's persistently <60 mL/min signify possible Chronic  Kidney Disease.    Anion gap 12 5 - 15  CBC with Differential     Status: Abnormal   Collection Time: 10/22/17  5:46 PM  Result Value Ref Range   WBC 10.8 (H) 4.0 - 10.5 K/uL   RBC 4.86 3.87 - 5.11 MIL/uL   Hemoglobin 14.2 12.0 - 15.0 g/dL   HCT 43.1 36.0 - 46.0 %   MCV 88.7 78.0 - 100.0 fL   MCH 29.2 26.0 - 34.0 pg   MCHC 32.9 30.0 - 36.0 g/dL   RDW 16.0 (H) 11.5 - 15.5 %   Platelets 783 (H) 150 - 400 K/uL   Neutrophils Relative % 82 %   Neutro Abs 8.8 (H) 1.7 - 7.7 K/uL   Lymphocytes Relative 11 %   Lymphs Abs 1.2 0.7 - 4.0 K/uL   Monocytes Relative 6 %   Monocytes Absolute 0.6 0.1 - 1.0 K/uL   Eosinophils Relative 1 %   Eosinophils Absolute 0.1 0.0 - 0.7 K/uL   Basophils Relative 0 %   Basophils Absolute 0.0 0.0 - 0.1 K/uL  I-stat troponin, ED     Status: None   Collection Time: 10/22/17  5:57 PM  Result Value Ref Range   Troponin i, poc 0.01 0.00 - 0.08 ng/mL   Comment 3            Comment: Due to the release kinetics of cTnI, a negative result within the first hours of the onset of symptoms does not rule out myocardial infarction with certainty. If myocardial infarction is still suspected, repeat the test at appropriate intervals.   I-stat Chem 8, ED     Status: Abnormal   Collection Time: 10/22/17  5:59 PM  Result Value Ref Range   Sodium 134 (L) 135 - 145 mmol/L   Potassium 4.2 3.5 - 5.1 mmol/L   Chloride 99 (L) 101 - 111 mmol/L   BUN 21 (H) 6 - 20 mg/dL   Creatinine, Ser 0.80 0.44 - 1.00 mg/dL   Glucose, Bld 100 (H) 65 - 99 mg/dL   Calcium, Ion 1.15 1.15 - 1.40 mmol/L   TCO2 26 22 - 32 mmol/L   Hemoglobin 16.0 (H) 12.0 - 15.0 g/dL   HCT 47.0 (H) 36.0 - 46.0 %  Troponin I     Status: None   Collection Time: 10/22/17  9:16  PM  Result Value Ref Range   Troponin I <0.03 <0.03 ng/mL   Dg Chest 2 View  Result Date: 10/22/2017 CLINICAL DATA:  Shortness of breath for several hours EXAM: CHEST  2 VIEW COMPARISON:  03/18/2016 FINDINGS: Cardiac shadow is within  normal limits. Right-sided chest wall port is noted in satisfactory position. The lungs are well aerated bilaterally without focal infiltrate or sizable effusion. Bilateral ureteral stents are noted. No bony abnormality is seen. IMPRESSION: No active cardiopulmonary disease. Electronically Signed   By: Inez Catalina M.D.   On: 10/22/2017 18:22   Ct Angio Chest Pe W/cm &/or Wo Cm  Result Date: 10/22/2017 CLINICAL DATA:  62 year old female with increase shortness of breath, metastatic colorectal cancer undergoing chemotherapy. EXAM: CT ANGIOGRAPHY CHEST WITH CONTRAST TECHNIQUE: Multidetector CT imaging of the chest was performed using the standard protocol during bolus administration of intravenous contrast. Multiplanar CT image reconstructions and MIPs were obtained to evaluate the vascular anatomy. CONTRAST:  156m ISOVUE-370 IOPAMIDOL (ISOVUE-370) INJECTION 76% COMPARISON:  CTA chest 04/04/2016 FINDINGS: Cardiovascular: Good contrast bolus timing in the pulmonary arterial tree. Positive bilateral pulmonary artery filling defects consistent with pulmonary emboli. Left upper and lower lobe involvement beginning at the left hilum. Similar right upper lobe, right middle lobe and lower lobe involvement. No saddle embolus. RV / LV ratio = 0.8 No pericardial effusion. Negative visible aorta. Right chest porta cath in place. Mediastinum/Nodes: No mediastinal lymphadenopathy. Moderate size gastric hiatal hernia is stable since 2017. Lungs/Pleura: Major airways are patent. No pleural effusion. No abnormal pulmonary opacity. Upper Abdomen: Partially visible left hydronephrosis and hydroureter with a left ureteral stent in place. Partially visible right hydronephrosis, with faintly visible proximal aspect of a right ureteral stent. No upper abdominal free fluid. Negative visible liver, gallbladder, spleen, pancreas, adrenal glands, and bowel in the upper abdomen. Musculoskeletal: Osteopenia. No acute osseous abnormality  identified. Review of the MIP images confirms the above findings. IMPRESSION: 1. Positive for bilateral acute pulmonary emboli. Bilateral upper and lower lobe involvement, but no saddle embolus or CTA evidence of right heart strain. 2. No pleural effusion or pulmonary infarct. 3. Partially visible hydronephrosis and bilateral ureteral stents in the upper abdomen. Critical Value/emergent results were called by telephone at the time of interpretation on 10/22/2017 at 7:43 pm to Dr. MAlferd Apa, who verbally acknowledged these results. Electronically Signed   By: HGenevie AnnM.D.   On: 10/22/2017 19:48    Pending Labs Unresulted Labs (From admission, onward)   Start     Ordered   10/23/17 0500  CBC  Daily,   R     10/22/17 2024   10/23/17 0500  HIV antibody (Routine Testing)  Tomorrow morning,   R     10/22/17 2115   10/23/17 [id number] Basic metabolic panel  Tomorrow morning,   R     10/22/17 2115   10/23/17 0300  Heparin level (unfractionated)  Once-Timed,   R     10/22/17 2024   10/22/17 1736  Brain natriuretic peptide  (ED Shortness of Breath)  Once,   STAT     10/22/17 1737      Vitals/Pain Today's Vitals   10/22/17 2010 10/22/17 2011 10/22/17 2051 10/22/17 2228  BP:   (!) 145/97 (!) 127/94  Pulse:   (!) 106 98  Resp:   (!) 24 15  Temp:      TempSrc:      SpO2:   100% 100%  Weight: 138 lb (62.6 kg)  Height: _0  (1.651 m)     PainSc:  3       Isolation Precautions No active isolations  Medications Medications  heparin ADULT infusion 100 units/mL (25000 units/221m sodium chloride 0.45%) (1,100 Units/hr Intravenous New Bag/Given 10/22/17 2048)  DULoxetine (CYMBALTA) DR capsule 60 mg (60 mg Oral Given 10/22/17 2238)  ferrous sulfate tablet 325 mg (not administered)  methadone (DOLOPHINE) tablet 10 mg (10 mg Oral Given 10/22/17 2239)  minocycline (MINOCIN,DYNACIN) capsule 100 mg (100 mg Oral Given 10/22/17 2239)  morphine (MSIR) tablet 15-30 mg (not administered)  pantoprazole  (PROTONIX) EC tablet 40 mg (not administered)  polyethylene glycol (MIRALAX / GLYCOLAX) packet 17 g (not administered)  potassium chloride (K-DUR) CR tablet 10 mEq (10 mEq Oral Given 10/22/17 2239)  acetaminophen (TYLENOL) tablet 650 mg (not administered)    Or  acetaminophen (TYLENOL) suppository 650 mg (not administered)  ondansetron (ZOFRAN) tablet 4 mg (not administered)    Or  ondansetron (ZOFRAN) injection 4 mg (not administered)  0.9 %  sodium chloride infusion ( Intravenous New Bag/Given 10/22/17 2225)  sorbitol 70 % solution 30 mL (not administered)  iopamidol (ISOVUE-370) 76 % injection 100 mL (100 mLs Intravenous Contrast Given 10/22/17 1916)  heparin bolus via infusion 4,000 Units (4,000 Units Intravenous Bolus from Bag 10/22/17 2048)    Mobility walks

## 2017-10-22 NOTE — H&P (Signed)
History and Physical    Carolyn Barton JSH:[id number] DOB: 11-Mar-1956 DOA: 10/22/2017  PCP: Marton Redwood, MD  Patient coming from: Home.  Chief Complaint: Shortness of breath.  HPI: Carolyn Barton is a 62 y.o. female with history of metastatic colon cancer started experiencing increasing left lower extremity edema for the last 1 week.  Patient had followed up with the oncologist and had Dopplers done which was negative for DVT yesterday.  Last night patient started developing shortness of breath which has become progressively worsened with minimal exertion.  Denies any chest pain.  Denies any productive cough fever or chills.  ED Course: The ER patient had a CT angiogram of the chest which shows bilateral pulmonary embolism without any strain pattern.  Patient is hemodynamically stable and was placed on IV heparin.  On exam patient has significant edema of the left lower extremity extending up to the thigh.  Review of Systems: As per HPI, rest all negative.   Past Medical History:  Diagnosis Date  . Chronic back pain    lumbar    . Colostomy in place [facility])   . 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 History of chemotherapy last chemo 09-21-17  . Hypokalemia    takes potassium  . Iron deficiency anemia 04/14/2016   treated w/ Iron infusions  . Liver metastasis ([place])    secondary to colorectal cancer  . Metastatic colorectal cancer [facility]) dx 03/03/2016--- oncologist-- dr Benay Spice   metastatic colorectal carcinoma--  s/p  exp. lap. for bowel obstruction--  rectal mass, peritoneal carcinomatosis, liver mets---  chemotherapy  . Spinal headache 12/21/2012  . Tachycardia    persistant since discharged from hospital 06/ 2017 due to anemia and deconditioning;  as of 09-02-2016 per pt no issues w/ heart racing in the past few weeks  . Ureteral obstruction, left   . Wears contact lenses     Past Surgical History:  Procedure Laterality Date  . BIOPSY N/A 03/03/2016    Procedure: BIOPSY OF PERITONEAL NODULE;  Surgeon: Erroll Luna, MD;  Location: [place];  Service: General;  Laterality: N/A;  . COLOSTOMY N/A 03/03/2016   Procedure: DIVERTING SIGMOID COLOSTOMY;  Surgeon: Erroll Luna, MD;  Location: [place];  Service: General;  Laterality: N/A;  . CYSTOSCOPY W/ RETROGRADES Right 10/13/2017   Procedure: CYSTOSCOPY WITH RETROGRADE PYELOGRAM/ STENT PLACEMENT;  Surgeon: Ceasar Mons, MD;  Location: [facility];  Service: Urology;  Laterality: Right;  ONLY NEEDS 30 MIN FOR BOTH PROCEDURES  . CYSTOSCOPY W/ URETERAL STENT PLACEMENT Left 10/09/2016   Procedure: CYSTOSCOPY WITH LEFT STENT REPLACEMENT 21F POLARIS STENT 24CM;  Surgeon: Carolan Clines, MD;  Location: [facility];  Service: Urology;  Laterality: Left;  . CYSTOSCOPY W/ URETERAL STENT PLACEMENT Left 03/16/2017   Procedure: CYSTOSCOPY WITH RETROGRADE PYELOGRAM WITH LEFT  STENT REMOVAL AND REPLACEMENT;  Surgeon: Carolan Clines, MD;  Location: [facility];  Service: Urology;  Laterality: Left;  . CYSTOSCOPY W/ URETERAL STENT PLACEMENT Left 10/13/2017   Procedure: CYSTOSCOPY WITH STENT REPLACEMENT;  Surgeon: Ceasar Mons, MD;  Location: [facility];  Service: Urology;  Laterality: Left;  . CYSTOSCOPY WITH FULGERATION N/A 10/13/2017   Procedure: CYSTOSCOPY WITH FULGERATION;  Surgeon: Ceasar Mons, MD;  Location: [facility];  Service: Urology;  Laterality: N/A;  . CYSTOSCOPY WITH RETROGRADE PYELOGRAM, URETEROSCOPY AND STENT PLACEMENT Left 03/18/2016   Procedure: CYSTOSCOPY WITH LEFT  RETROGRADE PYELOGRAM,  AND  POLARIS STENT PLACEMENT;  Surgeon: Carolan Clines, MD;  Location: [facility];  Service: Urology;  Laterality: Left;  . LAPAROTOMY N/A 03/03/2016   Procedure: EXPLORATORY LAPAROTOMY;  Surgeon: Erroll Luna, MD;  Location: [place];  Service: General;  Laterality: N/A;  . LUMBAR LAMINECTOMY   11/11/2012   L4-5  and Resection synovial cyst  . MAXIMUM ACCESS (MAS)POSTERIOR LUMBAR INTERBODY FUSION (PLIF) 1 LEVEL N/A 01/11/2014   Procedure: FOR MAXIMUM ACCESS (MAS) POSTERIOR LUMBAR INTERBODY FUSION Lumbar Five Sacral One;  Surgeon: [name], MD;  Location: [place];  Service: Neurosurgery;  Laterality: N/A;  FOR MAXIMUM ACCESS (MAS) POSTERIOR LUMBAR INTERBODY FUSION Lumbar Five Sacral One  . PORTACATH PLACEMENT  04/10/2016  . REPAIR CEREBROSPINAL FLUID LEAK POST RESECTION SYNOVIAL CYST  12/21/2012  . REVISION LUMBAR HARDWARE  01/26/2014  . TRANSTHORACIC ECHOCARDIOGRAM  06/17/2016   grade 1 diastolic function , ef 03-50%/  trivial TR  . WISDOM TOOTH EXTRACTION       reports that she quit smoking about 39 years ago. Her smoking use included cigarettes. She has a 0.25 pack-year smoking history. she has never used smokeless tobacco. She reports that she does not drink alcohol or use drugs.  No Known Allergies  Family History  Problem Relation Age of Onset  . Heart disease Mother   . Melanoma Mother   . Colon cancer Neg Hx   . Colon polyps Neg Hx   . Rectal cancer Neg Hx   . Stomach cancer Neg Hx   . Esophageal cancer Neg Hx     Prior to Admission medications   Medication Sig Start Date End Date Taking? Authorizing Provider  capecitabine (XELODA) 500 MG tablet TAKE 3 TABLETS (1500 MG) IN AM AND 2 TABLETS (1000 MG) IN PM-TOTAL 2500 MG DAILY. TAKE ON DAYS 1-7 AND 15-21 OF EACH 28 DAY CYCLE. 08/24/17  Yes Ladell Pier, MD  DULoxetine (CYMBALTA) 60 MG capsule Take 1 capsule (60 mg total) by mouth every evening. 06/15/17  Yes Ladell Pier, MD  ferrous sulfate 325 (65 FE) MG EC tablet Take 1 tablet (325 mg total) by mouth daily. 11/17/16  Yes Ladell Pier, MD  fluticasone (CUTIVATE) 0.05 % cream Apply topically 2 (two) times daily. 11/17/16  Yes Ladell Pier, MD  HYDROmorphone (DILAUDID) 4 MG tablet Take 4 mg by mouth every 4 (four) hours as needed for severe pain.     Yes [provider]  ketorolac (TORADOL) 10 MG tablet Take 1 tablet (10 mg total) by mouth every 8 (eight) hours. for pain 10/12/17  Yes Ladell Pier, MD  lidocaine-prilocaine (EMLA) cream Apply to port site one hour prior to use. Do not rub in. Cover with plastic. 03/02/17  Yes Owens Shark, NP  Meth-Hyo-M Barnett Hatter Phos-Ph Sal (URIBEL) 118 MG CAPS Take 1 capsule (118 mg total) by mouth 3 times/day as needed-between meals & bedtime. 03/16/17  Yes Carolan Clines, MD  methadone (DOLOPHINE) 10 MG tablet Take 10 mg by mouth 3 (three) times daily.    Yes [provider]  minocycline (MINOCIN) 100 MG capsule Take 1 capsule (100 mg total) by mouth 2 (two) times daily. 12/08/16  Yes Ladell Pier, MD  morphine (MSIR) 15 MG tablet Take 1-2 tablets (15-30 mg total) by mouth every 4 (four) hours as needed for severe pain. 10/21/17  Yes Ladell Pier, MD  omeprazole (PRILOSEC) 20 MG capsule Take 1 capsule (20 mg total) by mouth daily. 10/21/17  Yes Sherrill,  Izola Price, MD  polyethylene glycol (MIRALAX / GLYCOLAX) packet Take 17 g by mouth daily as needed for mild constipation.    Yes [provider]  potassium chloride (K-DUR) 10 MEQ tablet Take 1 tablet (10 mEq total) by mouth 2 (two) times daily. 09/21/17  Yes Ladell Pier, MD  sorbitol 70 % solution Take 30 cc twice today, then 30 cc daily 10/19/17  Yes Owens Shark, NP  ondansetron (ZOFRAN) 8 MG tablet Take 1 tablet (8 mg total) by mouth every 8 (eight) hours as needed for nausea or vomiting. 04/24/17   Owens Shark, NP  phenazopyridine (PYRIDIUM) 200 MG tablet Take 1 tablet (200 mg total) by mouth 3 (three) times daily as needed for pain. Patient not taking: Reported on 10/22/2017 10/13/17 10/13/18  Ceasar Mons, MD  prochlorperazine (COMPAZINE) 10 MG tablet Take 1 tablet (10 mg total) by mouth every 6 (six) hours as needed for nausea or vomiting. 09/15/16   Owens Shark, NP    Physical Exam: Vitals:    10/22/17 1840 10/22/17 1950 10/22/17 2010 10/22/17 2051  BP: (!) 148/96 (!) 152/98  (!) 145/97  Pulse: (!) 108 (!) 108  (!) 106  Resp: (!) 21 (!) 22  (!) 24  Temp:      TempSrc:      SpO2: 100% 100%  100%  Weight:   62.6 kg (138 lb)   Height:   5\' 5"  (1.651 m)       Constitutional: Moderately built and nourished. Vitals:   10/22/17 1840 10/22/17 1950 10/22/17 2010 10/22/17 2051  BP: (!) 148/96 (!) 152/98  (!) 145/97  Pulse: (!) 108 (!) 108  (!) 106  Resp: (!) 21 (!) 22  (!) 24  Temp:      TempSrc:      SpO2: 100% 100%  100%  Weight:   62.6 kg (138 lb)   Height:   5\' 5"  (1.651 m)    Eyes: Anicteric no pallor. ENMT: No discharge from the ears eyes nose or mouth. Neck: No mass felt.  No neck rigidity.  No JVD appreciated. Respiratory: No rhonchi or crepitations. Cardiovascular: S1-S2 heard no murmurs appreciated. Abdomen: Soft nontender bowel sounds present. Musculoskeletal: Edema extending up to the left thigh. Skin: No rash. Neurologic: Alert awake oriented to time place and person.  Moves all extremities. Psychiatric: Appears normal.  Normal affect.   Labs on Admission: I have personally reviewed following labs and imaging studies  CBC: Recent Labs  Lab 10/21/17 0739 10/22/17 1746 10/22/17 1759  WBC 12.4* 10.8*  --   NEUTROABS 9.3* 8.8*  --   HGB  --  14.2 16.0*  HCT 40.3 43.1 47.0*  MCV 90.0 88.7  --   PLT 643* 783*  --    Basic Metabolic Panel: Recent Labs  Lab 10/21/17 0739 10/22/17 1746 10/22/17 1759  NA 132* 135 134*  K 4.1 4.0 4.2  CL 98 98* 99*  CO2 25 25  --   GLUCOSE 111 100* 100*  BUN 13 20 21*  CREATININE  --  0.89 0.80  CALCIUM 9.4 9.4  --   MG 2.1  --   --    GFR: Estimated Creatinine Clearance: 67.3 mL/min (by C-G formula based on SCr of 0.8 mg/dL). Liver Function Tests: Recent Labs  Lab 10/21/17 0739  AST 14  ALT 11  ALKPHOS 107  BILITOT 0.5  PROT 7.0  ALBUMIN 2.9*   No results for input(s): LIPASE, AMYLASE in  the last  168 hours. No results for input(s): AMMONIA in the last 168 hours. Coagulation Profile: No results for input(s): INR, PROTIME in the last 168 hours. Cardiac Enzymes: No results for input(s): CKTOTAL, CKMB, CKMBINDEX, TROPONINI in the last 168 hours. BNP (last 3 results) No results for input(s): PROBNP in the last 8760 hours. HbA1C: No results for input(s): HGBA1C in the last 72 hours. CBG: No results for input(s): GLUCAP in the last 168 hours. Lipid Profile: No results for input(s): CHOL, HDL, LDLCALC, TRIG, CHOLHDL, LDLDIRECT in the last 72 hours. Thyroid Function Tests: No results for input(s): TSH, T4TOTAL, FREET4, T3FREE, THYROIDAB in the last 72 hours. Anemia Panel: No results for input(s): VITAMINB12, FOLATE, FERRITIN, TIBC, IRON, RETICCTPCT in the last 72 hours. Urine analysis:    Component Value Date/Time   COLORURINE YELLOW 10/11/2017 1720   APPEARANCEUR CLEAR 10/11/2017 1720   LABSPEC 1.009 10/11/2017 1720   LABSPEC 1.015 11/17/2016 1547   PHURINE 6.0 10/11/2017 1720   GLUCOSEU 50 (A) 10/11/2017 1720   GLUCOSEU Negative 11/17/2016 1547   HGBUR MODERATE (A) 10/11/2017 1720   BILIRUBINUR NEGATIVE 10/11/2017 1720   BILIRUBINUR Negative 11/17/2016 1547   KETONESUR 5 (A) 10/11/2017 1720   PROTEINUR NEGATIVE 10/11/2017 1720   UROBILINOGEN 0.2 11/17/2016 1547   NITRITE NEGATIVE 10/11/2017 1720   LEUKOCYTESUR MODERATE (A) 10/11/2017 1720   LEUKOCYTESUR Small 11/17/2016 1547   Sepsis Labs: @LABRCNTIP (procalcitonin:4,lacticidven:4) )No results found for this or any previous visit (from the past 240 hour(s)).   Radiological Exams on Admission: Dg Chest 2 View  Result Date: 10/22/2017 CLINICAL DATA:  Shortness of breath for several hours EXAM: CHEST  2 VIEW COMPARISON:  03/18/2016 FINDINGS: Cardiac shadow is within normal limits. Right-sided chest wall port is noted in satisfactory position. The lungs are well aerated bilaterally without focal infiltrate or sizable  effusion. Bilateral ureteral stents are noted. No bony abnormality is seen. IMPRESSION: No active cardiopulmonary disease. Electronically Signed   By: Inez Catalina M.D.   On: 10/22/2017 18:22   Ct Angio Chest Pe W/cm &/or Wo Cm  Result Date: 10/22/2017 CLINICAL DATA:  62 year old female with increase shortness of breath, metastatic colorectal cancer undergoing chemotherapy. EXAM: CT ANGIOGRAPHY CHEST WITH CONTRAST TECHNIQUE: Multidetector CT imaging of the chest was performed using the standard protocol during bolus administration of intravenous contrast. Multiplanar CT image reconstructions and MIPs were obtained to evaluate the vascular anatomy. CONTRAST:  149mL ISOVUE-370 IOPAMIDOL (ISOVUE-370) INJECTION 76% COMPARISON:  CTA chest 04/04/2016 FINDINGS: Cardiovascular: Good contrast bolus timing in the pulmonary arterial tree. Positive bilateral pulmonary artery filling defects consistent with pulmonary emboli. Left upper and lower lobe involvement beginning at the left hilum. Similar right upper lobe, right middle lobe and lower lobe involvement. No saddle embolus. RV / LV ratio = 0.8 No pericardial effusion. Negative visible aorta. Right chest porta cath in place. Mediastinum/Nodes: No mediastinal lymphadenopathy. Moderate size gastric hiatal hernia is stable since 2017. Lungs/Pleura: Major airways are patent. No pleural effusion. No abnormal pulmonary opacity. Upper Abdomen: Partially visible left hydronephrosis and hydroureter with a left ureteral stent in place. Partially visible right hydronephrosis, with faintly visible proximal aspect of a right ureteral stent. No upper abdominal free fluid. Negative visible liver, gallbladder, spleen, pancreas, adrenal glands, and bowel in the upper abdomen. Musculoskeletal: Osteopenia. No acute osseous abnormality identified. Review of the MIP images confirms the above findings. IMPRESSION: 1. Positive for bilateral acute pulmonary emboli. Bilateral upper and lower  lobe involvement, but no saddle embolus or CTA  evidence of right heart strain. 2. No pleural effusion or pulmonary infarct. 3. Partially visible hydronephrosis and bilateral ureteral stents in the upper abdomen. Critical Value/emergent results were called by telephone at the time of interpretation on 10/22/2017 at 7:43 pm to Dr. Alferd Apa , who verbally acknowledged these results. Electronically Signed   By: Genevie Ann M.D.   On: 10/22/2017 19:48    EKG: Independently reviewed.  Normal sinus rhythm.  Assessment/Plan Principal Problem:   Bilateral pulmonary embolism (HCC) Active Problems:   Iron deficiency anemia   Metastatic colorectal cancer ([place])    1. Acute bilateral pulmonary embolism hemodynamically stable unprovoked with left lower extremity edema -for now patient is on IV heparin which can be transitioned to Lovenox once patient is hemodynamically stable.  Please notify Dr. Learta Codding patient's oncologist in a.m. 2. History of iron deficiency anemia on iron supplements. 3. Metastatic colon cancer receiving chemotherapy per Dr. Learta Codding. 4. History of hypertension per the chart presently not on medication. 5. Chronic pain on pain relief medication which should be continued.   DVT prophylaxis: Heparin. Code Status: Full code. Family Communication: Discussed with patient. Disposition Plan: Home. Consults called: None. Admission status: Observation.   Rise Patience MD [facility] Pager [phone].  If 7PM-7AM, please contact night-coverage [email or web address] Password [facility]  10/22/2017, 9:16 PM

## 2017-10-22 NOTE — ED Notes (Signed)
Please call report to Maretta Bees, RN at [phone] at [phone]. Thanks.

## 2017-10-22 NOTE — ED Provider Notes (Signed)
Complains of shortness of breath with minimal exertion such as walking around her house for the past 2 days.  Patient states on a good day as of 2 weeks ago she could walk several city blocks without difficulty.  She denies cough denies fever denies pain anywhere.  She has had chronic edema of her left leg for several weeks.  On exam alert chronically ill-appearing no respiratory distress.  Lungs clear to auscultation heart regular rate and rhythm abdomen nondistended nontender.  Left lower extremity with lymphedema.  Other extremities not edematous, neurovascular intact ED ECG REPORT   Date: 10/22/2017  Rate: 110  Rhythm: sinus tachycardia  QRS Axis: normal  Intervals: normal  ST/T Wave abnormalities: normal  Conduction Disutrbances:none  Narrative Interpretation:   Old EKG Reviewed: unchanged No significant change from 03/03/2016 I have personally reviewed the EKG tracing and agree with the computerized printout as noted.   Orlie Dakin, MD 10/22/17 2329

## 2017-10-23 ENCOUNTER — Ambulatory Visit: Payer: BLUE CROSS/BLUE SHIELD

## 2017-10-23 ENCOUNTER — Telehealth: Payer: Self-pay | Admitting: Emergency Medicine

## 2017-10-23 DIAGNOSIS — N1330 Unspecified hydronephrosis: Secondary | ICD-10-CM | POA: Diagnosis not present

## 2017-10-23 DIAGNOSIS — C20 Malignant neoplasm of rectum: Secondary | ICD-10-CM

## 2017-10-23 DIAGNOSIS — C787 Secondary malignant neoplasm of liver and intrahepatic bile duct: Secondary | ICD-10-CM

## 2017-10-23 DIAGNOSIS — R600 Localized edema: Secondary | ICD-10-CM | POA: Diagnosis not present

## 2017-10-23 DIAGNOSIS — M549 Dorsalgia, unspecified: Secondary | ICD-10-CM | POA: Diagnosis not present

## 2017-10-23 DIAGNOSIS — R1907 Generalized intra-abdominal and pelvic swelling, mass and lump: Secondary | ICD-10-CM | POA: Diagnosis not present

## 2017-10-23 DIAGNOSIS — Z85048 Personal history of other malignant neoplasm of rectum, rectosigmoid junction, and anus: Secondary | ICD-10-CM | POA: Diagnosis not present

## 2017-10-23 DIAGNOSIS — Z808 Family history of malignant neoplasm of other organs or systems: Secondary | ICD-10-CM | POA: Diagnosis not present

## 2017-10-23 DIAGNOSIS — I2699 Other pulmonary embolism without acute cor pulmonale: Secondary | ICD-10-CM | POA: Diagnosis present

## 2017-10-23 DIAGNOSIS — R0602 Shortness of breath: Secondary | ICD-10-CM | POA: Diagnosis present

## 2017-10-23 DIAGNOSIS — Z87891 Personal history of nicotine dependence: Secondary | ICD-10-CM | POA: Diagnosis not present

## 2017-10-23 DIAGNOSIS — D473 Essential (hemorrhagic) thrombocythemia: Secondary | ICD-10-CM | POA: Diagnosis not present

## 2017-10-23 DIAGNOSIS — G8929 Other chronic pain: Secondary | ICD-10-CM | POA: Diagnosis present

## 2017-10-23 DIAGNOSIS — N134 Hydroureter: Secondary | ICD-10-CM | POA: Diagnosis not present

## 2017-10-23 DIAGNOSIS — I10 Essential (primary) hypertension: Secondary | ICD-10-CM | POA: Diagnosis present

## 2017-10-23 DIAGNOSIS — Z9221 Personal history of antineoplastic chemotherapy: Secondary | ICD-10-CM | POA: Diagnosis not present

## 2017-10-23 DIAGNOSIS — Z933 Colostomy status: Secondary | ICD-10-CM | POA: Diagnosis not present

## 2017-10-23 DIAGNOSIS — C786 Secondary malignant neoplasm of retroperitoneum and peritoneum: Secondary | ICD-10-CM | POA: Diagnosis present

## 2017-10-23 DIAGNOSIS — D509 Iron deficiency anemia, unspecified: Secondary | ICD-10-CM | POA: Diagnosis present

## 2017-10-23 DIAGNOSIS — Z5111 Encounter for antineoplastic chemotherapy: Secondary | ICD-10-CM | POA: Diagnosis present

## 2017-10-23 DIAGNOSIS — R109 Unspecified abdominal pain: Secondary | ICD-10-CM | POA: Diagnosis not present

## 2017-10-23 DIAGNOSIS — M79662 Pain in left lower leg: Secondary | ICD-10-CM | POA: Diagnosis not present

## 2017-10-23 DIAGNOSIS — Z8249 Family history of ischemic heart disease and other diseases of the circulatory system: Secondary | ICD-10-CM | POA: Diagnosis not present

## 2017-10-23 LAB — CBC
HCT: 38.2 % (ref 36.0–46.0)
Hemoglobin: 12.6 g/dL (ref 12.0–15.0)
MCH: 29.3 pg (ref 26.0–34.0)
MCHC: 33 g/dL (ref 30.0–36.0)
MCV: 88.8 fL (ref 78.0–100.0)
PLATELETS: 763 10*3/uL — AB (ref 150–400)
RBC: 4.3 MIL/uL (ref 3.87–5.11)
RDW: 16.3 % — AB (ref 11.5–15.5)
WBC: 10.8 10*3/uL — AB (ref 4.0–10.5)

## 2017-10-23 LAB — HEPARIN LEVEL (UNFRACTIONATED)
HEPARIN UNFRACTIONATED: 0.32 [IU]/mL (ref 0.30–0.70)
HEPARIN UNFRACTIONATED: 0.43 [IU]/mL (ref 0.30–0.70)

## 2017-10-23 LAB — BASIC METABOLIC PANEL
Anion gap: 8 (ref 5–15)
BUN: 17 mg/dL (ref 6–20)
CO2: 26 mmol/L (ref 22–32)
CREATININE: 0.87 mg/dL (ref 0.44–1.00)
Calcium: 8.6 mg/dL — ABNORMAL LOW (ref 8.9–10.3)
Chloride: 100 mmol/L — ABNORMAL LOW (ref 101–111)
GFR calc Af Amer: >60 mL/min (ref >60)
GLUCOSE: 119 mg/dL — AB (ref 65–99)
Potassium: 3.7 mmol/L (ref 3.5–5.1)
SODIUM: 134 mmol/L — AB (ref 135–145)

## 2017-10-23 LAB — HIV ANTIBODY (ROUTINE TESTING W REFLEX): HIV SCREEN 4TH GENERATION: NONREACTIVE

## 2017-10-23 LAB — BRAIN NATRIURETIC PEPTIDE: B Natriuretic Peptide: 35.4 pg/mL (ref 0.0–100.0)

## 2017-10-23 MED ORDER — ENOXAPARIN SODIUM 80 MG/0.8ML ~~LOC~~ SOLN
1.0000 mg/kg | Freq: Two times a day (BID) | SUBCUTANEOUS | Status: DC
  Administered 2017-10-23 – 2017-10-24 (×2): 65 mg via SUBCUTANEOUS
  Filled 2017-10-23 (×2): qty 0.8

## 2017-10-23 MED ORDER — MORPHINE SULFATE (PF) 4 MG/ML IV SOLN
2.0000 mg | Freq: Once | INTRAVENOUS | Status: AC
  Administered 2017-10-23: 2 mg via INTRAVENOUS
  Filled 2017-10-23: qty 1

## 2017-10-23 MED ORDER — SODIUM CHLORIDE 0.9% FLUSH: 10.0000 mL | INTRAVENOUS | Status: DC | PRN

## 2017-10-23 MED ORDER — HEPARIN (PORCINE) IN NACL 100-0.45 UNIT/ML-% IJ SOLN
1200.0000 [IU]/h | INTRAMUSCULAR | Status: AC
  Administered 2017-10-23 (×2): 1200 [IU]/h via INTRAVENOUS
  Filled 2017-10-23: qty 250

## 2017-10-23 MED ORDER — MORPHINE SULFATE (PF) 4 MG/ML IV SOLN
2.0000 mg | INTRAVENOUS | Status: DC | PRN
  Administered 2017-10-23: 4 mg via INTRAVENOUS
  Administered 2017-10-23: 2 mg via INTRAVENOUS
  Administered 2017-10-23: 4 mg via INTRAVENOUS
  Filled 2017-10-23 (×3): qty 1

## 2017-10-23 MED ORDER — SODIUM CHLORIDE 0.9% FLUSH
10.0000 mL | INTRAVENOUS | Status: DC | PRN
  Administered 2017-10-23: 10 mL
  Filled 2017-10-23: qty 10

## 2017-10-23 NOTE — Progress Notes (Signed)
IP PROGRESS NOTE  Subjective:   Ms. Blanks is well-known to me with a history of metastatic colon cancer.  She completed a cycle of FOLFIRI/Panitumumab 10/21/2017.  She had nausea during the infusion that was relieved with Compazine. She reports developing dyspnea beginning later in the day on 10/21/2017.  The dyspnea progressed yesterday and she presented to the emergency room for further evaluation.  A CT of the chest revealed bilateral pulmonary emboli.  She was admitted and placed on a heparin infusion.  She reports improvement in the dyspnea today._0  Reports pain at the left lower back beginning yesterday.  Objective: Vital signs in last 24 hours: Blood pressure 136/81, pulse 91, temperature 97.9 F (36.6 C), temperature source Oral, resp. rate 20, height 5' 5" (1.651 m), weight 147 lb 7.8 oz (66.9 kg), SpO2 100 %.  Intake/Output from previous day: 01/24 0701 - 01/25 0700 In: 62 [P.O.:220; I.V.:646] Out: 1005 [Urine:1005]  Physical Exam:  HEENT: No thrush or ulcers, mild white coat over the tongue Lungs: Mild bilateral wheeze, good air movement bilaterally, no respiratory distress Cardiac: Regular rate and rhythm, tachycardia Abdomen: No hepatomegaly, nontender, left lower quadrant colostomy Extremities: 2+ edema at the left leg below the knee Musculoskeletal all in discomfort is at the left posterior iliac, no tenderness or rash   Portacath/PICC-without erythema  Lab Results: Recent Labs    10/22/17 1746 10/22/17 1759 10/23/17 0252  WBC 10.8*  --  10.8*  HGB 14.2 16.0* 12.6  HCT 43.1 47.0* 38.2  PLT 783*  --  763*    BMET Recent Labs    10/22/17 1746 10/22/17 1759 10/23/17 0252  NA 135 134* 134*  K 4.0 4.2 3.7  CL 98* 99* 100*  CO2 25  --  26  GLUCOSE 100* 100* 119*  BUN 20 21* 17  CREATININE 0.89 0.80 0.87  CALCIUM 9.4  --  8.6*    Lab Results  Component Value Date   CEA1 1.32 10/12/2017    Studies/Results: Dg Chest 2 View  Result Date:  10/22/2017 CLINICAL DATA:  Shortness of breath for several hours EXAM: CHEST  2 VIEW COMPARISON:  03/18/2016 FINDINGS: Cardiac shadow is within normal limits. Right-sided chest wall port is noted in satisfactory position. The lungs are well aerated bilaterally without focal infiltrate or sizable effusion. Bilateral ureteral stents are noted. No bony abnormality is seen. IMPRESSION: No active cardiopulmonary disease. Electronically Signed   By: Inez Catalina M.D.   On: 10/22/2017 18:22   Ct Angio Chest Pe W/cm &/or Wo Cm  Result Date: 10/22/2017 CLINICAL DATA:  62 year old female with increase shortness of breath, metastatic colorectal cancer undergoing chemotherapy. EXAM: CT ANGIOGRAPHY CHEST WITH CONTRAST TECHNIQUE: Multidetector CT imaging of the chest was performed using the standard protocol during bolus administration of intravenous contrast. Multiplanar CT image reconstructions and MIPs were obtained to evaluate the vascular anatomy. CONTRAST:  156m ISOVUE-370 IOPAMIDOL (ISOVUE-370) INJECTION 76% COMPARISON:  CTA chest 04/04/2016 FINDINGS: Cardiovascular: Good contrast bolus timing in the pulmonary arterial tree. Positive bilateral pulmonary artery filling defects consistent with pulmonary emboli. Left upper and lower lobe involvement beginning at the left hilum. Similar right upper lobe, right middle lobe and lower lobe involvement. No saddle embolus. RV / LV ratio = 0.8 No pericardial effusion. Negative visible aorta. Right chest porta cath in place. Mediastinum/Nodes: No mediastinal lymphadenopathy. Moderate size gastric hiatal hernia is stable since 2017. Lungs/Pleura: Major airways are patent. No pleural effusion. No abnormal pulmonary opacity. Upper Abdomen: Partially visible left  hydronephrosis and hydroureter with a left ureteral stent in place. Partially visible right hydronephrosis, with faintly visible proximal aspect of a right ureteral stent. No upper abdominal free fluid. Negative visible  liver, gallbladder, spleen, pancreas, adrenal glands, and bowel in the upper abdomen. Musculoskeletal: Osteopenia. No acute osseous abnormality identified. Review of the MIP images confirms the above findings. IMPRESSION: 1. Positive for bilateral acute pulmonary emboli. Bilateral upper and lower lobe involvement, but no saddle embolus or CTA evidence of right heart strain. 2. No pleural effusion or pulmonary infarct. 3. Partially visible hydronephrosis and bilateral ureteral stents in the upper abdomen. Critical Value/emergent results were called by telephone at the time of interpretation on 10/22/2017 at 7:43 pm to Dr. Alferd Apa , who verbally acknowledged these results. Electronically Signed   By: Genevie Ann M.D.   On: 10/22/2017 19:48    Medications: I have reviewed the patient's current medications.  Assessment/Plan: 1.Metastatic colorectal cancer-status post an exploratory laparotomy 03/03/2016 revealing a proximal rectal mass, peritoneal carcinomatosis, and liver metastases  Biopsy of peritoneal nodules 03/03/2016 confirmed metastatic adenocarcinoma consistent with a colon primary  Foundation 1 testing-MSI-stable, tumor mutation burden-low, no BRAF NRAS or KRAS mutation  Cycle 1 FOLFIRI/PANITUMUMAB 04/14/2016  Cycle 2 FOLFIRI/panitumumab 04/28/2016  Cycle 3 FOLFIRI/panitumumab 05/12/2016  Cycle 4 FOLFIRI/panitumumab 05/27/2016  Cycle 5 FOLFIRI/panitumumab 06/09/2016  Restaging CT abdomen/pelvis 06/20/2016-no evidence of disease progression, decreased left hepatic lesion  Cycle 6 FOLFIRI/panitumumab 06/23/2016  Cycle 7 FOLFIRI/panitumumab 07/07/2016  Cycle 8 FOLFIRI/panitumumab 07/21/2016  Cycle 9 FOLFIRI/PANITUMUMAB 08/04/2016  Cycle 10 FOLFIRI/panitumumab 08/18/2016  Restaging CT 08/29/2016 with interval decrease in the size of the medial segment left liver lesion. Lesion identified previously in the rectosigmoid colon not evident on the current study.  5-FU/PANITUMUMAB  09/01/2016  Xeloda 7 days on/7 days off and PANITUMUMAB every 3 weeks 09/15/2016 (awaiting insurance approval for Xeloda 09/15/2016)  CT abdomen/pelvis 12/25/2016-unchanged 5 mm right hepatic lesion, resolution of medial segment left liver lesion, no new liver lesion. Residual soft tissue fullness in the sigmoid , persistent distal esophageal wall thickening  Continuationof PANITUMUMAB every 3 weeks and Xeloda 7 days on/7 days off  CT 06/19/2017-new cystic/solid lesion in the left adnexa  Xeloda/panitumumab continued  CT 10/08/2016-progression of cystic pelvic mass  Cycle 1 FOLFIRI/Panitumumab 10/21/2017  2.History of aBowel obstruction secondary to #1  3. Chronic back pain  4. Essential thrombocytosis  5. Early obstruction of the left ureter noted at the time of surgery 03/03/2016-Dr. Tannenbaum placed a left double-J stent 03/18/2016  New onset right hydronephrosis 10/11/2017  Right ureter stent placement 10/13/2017  6. Abdominal wall cellulitis 03/10/2016, blood cultures positive for coagulase negative staphylococcus-methicillin-resistant, status post treatment with vancomycin and Bactrim  7. Tachycardia-persistent following discharge from the hospital, likely related to anemia and deconditioning  CT chest 04/04/2016-negative for pulmonary embolism.  8. Port-A-Cath placement 04/10/2016, interventional radiology  9. Iron deficiency anemia. Feraheme 11/05/2015 , 11/12/2015,and 04/14/2016-persistent anemia and red cell microcytosis, oral iron resumed 11/17/2016-stable  10. Pain/tenderness left posterior iliac region-resolved  11. Hypokalemia-started on potassium replacement 06/09/2016  12. Skin rashand paronychiasecond[facility] to [facility], she continues minocycline, moisturizers, and fluticasone  13.Acute onset right low back pain 10/11/2017-likely secondary to obstruction of the right kidney;left ureter stent exchange and right ureter stent  placement 10/13/2017  14.Left leg pain and edema-bilateral lower extremity venous Doppler negative for DVT 10/13/2017 and 10/19/2017. I suspect she has a pelvic DVT  15.  Admission 10/22/2017 with bilateral pulmonary embolism, heparin drip initiated   [name] has metastatic  colon cancer.  She is completing a cycle of salvage therapy with FOLFIRI/panitumumab.  She is now admitted with acute pulmonary embolism.  I favor continuing heparin or Lovenox for at least 5 days and then transition to [place].  She is scheduled for an attempted aspiration of the large cystic pelvic mass next week.  I will discuss delaying this procedure or holding anticoagulation prior to the procedure with interventional radiology.  I will discuss discharge plans with Dr. Sarajane Jews.  Recommendations: 1.  Continue therapeutic heparin anticoagulation, convert to Lovenox at discharge 2.  Complete 5-FU infusion this afternoon, oncology are in will disconnect the infusion pump 3.  Continue methadone and morphine for pain 4.  Outpatient follow-up at the Cancer center     LOS: 0 days   [name] Coder, MD   10/23/2017, 9:49 AM

## 2017-10-23 NOTE — Care Management Note (Signed)
Case Management Note  Patient Details  Name: Carolyn Barton MRN: [id number] Date of Birth: 1955-11-12  Subjective/Objective: 62 y/o f admitted w/bilater PE. Hx: Colon Ca. From home. Has pcp,pharmacy.                   Action/Plan:d/c plan home.   Expected Discharge Date:  (unknown)               Expected Discharge Plan:  Home/Self Care  In-House Referral:     Discharge planning Services  CM Consult  Post Acute Care Choice:    Choice offered to:     DME Arranged:    DME Agency:     HH Arranged:    HH Agency:     Status of Service:  In process, will continue to follow  If discussed at Long Length of Stay Meetings, dates discussed:    Additional Comments:  Dessa Phi, RN 10/23/2017, 11:58 AM

## 2017-10-23 NOTE — Telephone Encounter (Signed)
Unable to chart on inpatient chart: Went to 4E to change patients port dressing. Site cleaned and bio patch applied. Needle intact.

## 2017-10-23 NOTE — Care Management Note (Signed)
Case Management Note  Patient Details  Name: Carolyn Barton MRN: [id number] Date of Birth: 02/03/1956  Subjective/Objective:  Patient benefit checked for lovenox/generic-not on the formulary.xarelto/eliquis-is on the formulary, & no co pay.MD/patient notified.                 Action/Plan:d/c home.   Expected Discharge Date:  (unknown)               Expected Discharge Plan:  Home/Self Care  In-House Referral:     Discharge planning Services  CM Consult  Post Acute Care Choice:    Choice offered to:     DME Arranged:    DME Agency:     HH Arranged:    HH Agency:     Status of Service:  In process, will continue to follow  If discussed at Long Length of Stay Meetings, dates discussed:    Additional Comments:  Dessa Phi, RN 10/23/2017, 3:37 PM

## 2017-10-23 NOTE — Progress Notes (Unsigned)
Unable to document on inpatient chart: Went to 4E to disconnect ambulatory pump. Infusion was complete. Noted the port dressing was no longer occlusive. Pt's RN notified, she reported they did not have the supplies needed on the unit. Informed her [facility] nurse can come back to change the dressing.

## 2017-10-23 NOTE — Progress Notes (Signed)
ANTICOAGULATION CONSULT NOTE   Pharmacy Consult for heparin Indication: new pulmonary embolus  No Known Allergies  Patient Measurements: Height: 5\' 5"  (165.1 cm) Weight: 138 lb (62.6 kg) IBW/kg (Calculated) : 57 Heparin Dosing Weight: 62 kg  Vital Signs: Temp: 98.6 F (37 C) (01/24 2320) Temp Source: Oral (01/24 2320) BP: 143/96 (01/24 2320) Pulse Rate: 96 (01/24 2320)  Labs: Recent Labs    10/21/17 0739  10/22/17 1746 10/22/17 1759 10/22/17 2116 10/23/17 0252  HGB  --    < > 14.2 16.0*  --  12.6  HCT 40.3  --  43.1 47.0*  --  38.2  PLT 643*  --  783*  --   --  763*  HEPARINUNFRC  --   --   --   --   --  0.32  CREATININE  --   --  0.89 0.80  --  0.87  TROPONINI  --   --   --   --  <0.03  --    < > = values in this interval not displayed.    Estimated Creatinine Clearance: 61.9 mL/min (by C-G formula based on SCr of 0.87 mg/dL).   Assessment: Patient's a 62 y.o F with metastatic colorectal cancer currently undergoing chemotherapy treatment, presented to the ED on 10/22/17 with c/o SOB and LE swelling.  Chest CTA showed bilateral PE (no evidence of right heart strain).  To start heparin for acute PE. Today, 1/25 0300 HL=0.32 at low end of goal, no infusion issues, some bleeding at IV site earlier but easily controlled with pressure.   Goal of Therapy:  Heparin level 0.3-0.7 units/ml Monitor platelets by anticoagulation protocol: Yes   Plan:  -Will increase heparin slightly to 1200 units/hr to ensure doesn't fall below goal as bolus wears off - check 8 hr heparin level - daily cbc  - monitor for s/s bleeding  Dorrene German 10/23/2017,4:17 AM

## 2017-10-23 NOTE — Progress Notes (Addendum)
ANTICOAGULATION CONSULT NOTE   Pharmacy Consult for heparin Indication: new pulmonary embolus  No Known Allergies  Patient Measurements: Height: 5\' 5"  (165.1 cm) Weight: 147 lb 7.8 oz (66.9 kg) IBW/kg (Calculated) : 57 Heparin Dosing Weight: 62 kg  Vital Signs: Temp: 97.9 F (36.6 C) (01/25 0440) Temp Source: Oral (01/25 0440) BP: 136/81 (01/25 0440) Pulse Rate: 91 (01/25 0440)  Labs: Recent Labs    10/21/17 0739  10/22/17 1746 10/22/17 1759 10/22/17 2116 10/23/17 0252 10/23/17 1043  HGB  --    < > 14.2 16.0*  --  12.6  --   HCT 40.3  --  43.1 47.0*  --  38.2  --   PLT 643*  --  783*  --   --  763*  --   HEPARINUNFRC  --   --   --   --   --  0.32 0.43  CREATININE  --   --  0.89 0.80  --  0.87  --   TROPONINI  --   --   --   --  <0.03  --   --    < > = values in this interval not displayed.    Estimated Creatinine Clearance: 61.9 mL/min (by C-G formula based on SCr of 0.87 mg/dL).   Assessment: Patient's a 62 y.o F with metastatic colorectal cancer currently undergoing chemotherapy treatment, presented to the ED on 10/22/17 with c/o SOB and LE swelling.  Chest CTA showed bilateral PE (no evidence of right heart strain).  To start heparin for acute PE.  Today, 10/23/2017  Heparin level therapeutic (0.43) on 1200 units/hr  CBC: Hgb decreased from admit but wnl, Plts elevated  No bleeding reported   Goal of Therapy:  Heparin level 0.3-0.7 units/ml Monitor platelets by anticoagulation protocol: Yes   Plan:   Continue heparin infusion at 1200 units/hr  Daily heparin level and CBC  Monitor for s/s bleeding  Plan noted to change to Lovenox at discharge  Peggyann Juba, PharmD, BCPS Pager: [phone] 10/23/2017,11:57 AM   Addendum: Consulted to transition to SQ lovenox 1mg /kg q12h Will stop IV heparin at 6PM, then begin lovenox 65mg  SQ q12h at [place] per MD request  Peggyann Juba, PharmD, BCPS 10/23/2017 12:16 PM

## 2017-10-23 NOTE — Progress Notes (Signed)
  PROGRESS NOTE  Carolyn Barton:[id number] DOB: 21-Dec-1955 DOA: 10/22/2017 PCP: Marton Redwood, MD  Brief Narrative: 39yow PMH metastatic colon cancer currently undergoing chemotherapy per Dr. Benay Spice presented with SOB/DOE. Admitted for bilateral acute PE without hypoxia.  Assessment/Plan Bilateral acute pulmonary emboli of the upper and lower lobes.  There is no saddle embolus and no right heart strain. - currently on IV heparin. Hemodynamics stable and no hypoxia.  - discussed with Dr. Benay Spice, will transition to enoxaparin 1 mg/kg BID this evening and continue as outpatient. He will see in office next Wednesday and determine whether aspiration will proceed. He will transition patient to Xarelto when appropriate.  Metastatic colon cancer - currently undergoing chemotherapy per Dr. Benay Spice  Essential thrombocytosis - stable  Chronic pain - continue methadone, morphine   Anticipate discharge home 1/26 AM. Will coordinate enoxaparin Rx with CM to ensure no barriers to obtaining. Discussed with RN, will instruct patient in self-administration.   DVT prophylaxis: heparin >> enoxaparin Code Status: full Family Communication: none Disposition Plan: home    Murray Hodgkins, MD  [facility] Direct contact: [phone] --[place] [place]  --[email or web address]; password [email or web address]  7PM-7AM contact night coverage as above 10/23/2017, 12:11 PM  LOS: 0 days   Consultants:  Oncology   Procedures:    Antimicrobials:    Interval history/Subjective: Had vomiting earlier but feels better now. Breathing much better. No chest pain.  Objective: Vitals:  Vitals:   10/22/17 2320 10/23/17 0440  BP: (!) 143/96 136/81  Pulse: 96 91  Resp: 20 20  Temp: 98.6 F (37 C) 97.9 F (36.6 C)  SpO2: 100% 100%    Exam:  Constitutional:  . Appears calm and comfortable Respiratory:  . CTA bilaterally, no w/r/r.  . Respiratory effort normal.  Cardiovascular:  . RRR, no  m/r/g . No RLE extremity edema. 2+ LLE. Psychiatric:  . Mental status o Mood, affect appropriate  I have personally reviewed the following:   Labs:  Sodium 134, remainder BMP unremarkable  WBC stable 10.8, plts stable 763, Hgb stable 12.6   Imaging studies:  CXR NAD  CTA noted  Medical tests:  EKG ST     Scheduled Meds: . DULoxetine  60 mg Oral QPM  . ferrous sulfate  325 mg Oral Daily  . methadone  10 mg Oral TID  . minocycline  100 mg Oral BID  . pantoprazole  40 mg Oral Daily  . potassium chloride  10 mEq Oral BID  . sorbitol  30 mL Oral Daily   Continuous Infusions: . sodium chloride 75 mL/hr at 10/22/17 2225  . heparin 1,200 Units/hr (10/23/17 0420)    Principal Problem:   Bilateral pulmonary embolism (HCC) Active Problems:   Essential thrombocytosis (HCC)   Metastatic colorectal cancer (HCC)   Chronic pain   LOS: 0 days

## 2017-10-24 DIAGNOSIS — D473 Essential (hemorrhagic) thrombocythemia: Secondary | ICD-10-CM

## 2017-10-24 LAB — CBC
HEMATOCRIT: 35.2 % — AB (ref 36.0–46.0)
HEMOGLOBIN: 11.6 g/dL — AB (ref 12.0–15.0)
MCH: 29.4 pg (ref 26.0–34.0)
MCHC: 33 g/dL (ref 30.0–36.0)
MCV: 89.3 fL (ref 78.0–100.0)
Platelets: 669 10*3/uL — ABNORMAL HIGH (ref 150–400)
RBC: 3.94 MIL/uL (ref 3.87–5.11)
RDW: 16.1 % — ABNORMAL HIGH (ref 11.5–15.5)
WBC: 10.2 10*3/uL (ref 4.0–10.5)

## 2017-10-24 LAB — HEPARIN LEVEL (UNFRACTIONATED): Heparin Unfractionated: 0.39 IU/mL (ref 0.30–0.70)

## 2017-10-24 MED ORDER — PROCHLORPERAZINE MALEATE 10 MG PO TABS
10.0000 mg | ORAL_TABLET | Freq: Four times a day (QID) | ORAL | Status: DC | PRN
  Administered 2017-10-24: 10 mg via ORAL
  Filled 2017-10-24: qty 1

## 2017-10-24 MED ORDER — ENOXAPARIN SODIUM 60 MG/0.6ML ~~LOC~~ SOLN: 60.0000 mg | Freq: Two times a day (BID) | SUBCUTANEOUS | Status: DC

## 2017-10-24 MED ORDER — HEPARIN SOD (PORK) LOCK FLUSH 100 UNIT/ML IV SOLN
500.0000 [IU] | INTRAVENOUS | Status: AC | PRN
  Administered 2017-10-24: 500 [IU]

## 2017-10-24 MED ORDER — PROCHLORPERAZINE MALEATE 10 MG PO TABS: 10.0000 mg | ORAL_TABLET | Freq: Four times a day (QID) | ORAL | Status: DC | PRN

## 2017-10-24 NOTE — Progress Notes (Signed)
IP PROGRESS NOTE  Subjective:   Carolyn Barton reports marked improvement in the dyspnea.  The left leg swelling has also improved.  She had an episode of nausea and vomiting last night.  She reports anorexia.  No diarrhea.  She was able to give herself the Lovenox injection this morning.  She continues to have pain at the left posterior pelvis. Objective: Vital signs in last 24 hours: Blood pressure (!) 146/99, pulse 99, temperature 98.9 F (37.2 C), temperature source Oral, resp. rate 20, height '5\' 5"'$  (1.651 m), weight 147 lb 4.3 oz (66.8 kg), SpO2 99 %.  Intake/Output from previous day: 01/25 0701 - 01/26 0700 In: 1335 [P.O.:360; I.V.:975] Out: 450 [Urine:450]  Physical Exam:  HEENT: No thrush or ulcers, mild white coat over the tongue Lungs: Clear bilaterally, no respiratory distress Cardiac: Regular rate and rhythm Abdomen: No hepatomegaly, nontender, left lower quadrant colostomy Extremities: Edema throughout the left leg, the leg is softer Musculoskeletal all in discomfort is at the left posterior iliac, no tenderness or rash   Portacath/PICC-without erythema  Lab Results: Recent Labs    10/23/17 0252 10/24/17 0419  WBC 10.8* 10.2  HGB 12.6 11.6*  HCT 38.2 35.2*  PLT 763* 669*    BMET Recent Labs    10/22/17 1746 10/22/17 1759 10/23/17 0252  NA 135 134* 134*  K 4.0 4.2 3.7  CL 98* 99* 100*  CO2 25  --  26  GLUCOSE 100* 100* 119*  BUN 20 21* 17  CREATININE 0.89 0.80 0.87  CALCIUM 9.4  --  8.6*    Lab Results  Component Value Date   CEA1 1.32 10/12/2017    Studies/Results: Dg Chest 2 View  Result Date: 10/22/2017 CLINICAL DATA:  Shortness of breath for several hours EXAM: CHEST  2 VIEW COMPARISON:  03/18/2016 FINDINGS: Cardiac shadow is within normal limits. Right-sided chest wall port is noted in satisfactory position. The lungs are well aerated bilaterally without focal infiltrate or sizable effusion. Bilateral ureteral stents are noted. No bony  abnormality is seen. IMPRESSION: No active cardiopulmonary disease. Electronically Signed   By: Inez Catalina M.D.   On: 10/22/2017 18:22   Ct Angio Chest Pe W/cm &/or Wo Cm  Result Date: 10/22/2017 CLINICAL DATA:  62 year old female with increase shortness of breath, metastatic colorectal cancer undergoing chemotherapy. EXAM: CT ANGIOGRAPHY CHEST WITH CONTRAST TECHNIQUE: Multidetector CT imaging of the chest was performed using the standard protocol during bolus administration of intravenous contrast. Multiplanar CT image reconstructions and MIPs were obtained to evaluate the vascular anatomy. CONTRAST:  177m ISOVUE-370 IOPAMIDOL (ISOVUE-370) INJECTION 76% COMPARISON:  CTA chest 04/04/2016 FINDINGS: Cardiovascular: Good contrast bolus timing in the pulmonary arterial tree. Positive bilateral pulmonary artery filling defects consistent with pulmonary emboli. Left upper and lower lobe involvement beginning at the left hilum. Similar right upper lobe, right middle lobe and lower lobe involvement. No saddle embolus. RV / LV ratio = 0.8 No pericardial effusion. Negative visible aorta. Right chest porta cath in place. Mediastinum/Nodes: No mediastinal lymphadenopathy. Moderate size gastric hiatal hernia is stable since 2017. Lungs/Pleura: Major airways are patent. No pleural effusion. No abnormal pulmonary opacity. Upper Abdomen: Partially visible left hydronephrosis and hydroureter with a left ureteral stent in place. Partially visible right hydronephrosis, with faintly visible proximal aspect of a right ureteral stent. No upper abdominal free fluid. Negative visible liver, gallbladder, spleen, pancreas, adrenal glands, and bowel in the upper abdomen. Musculoskeletal: Osteopenia. No acute osseous abnormality identified. Review of the MIP images confirms  the above findings. IMPRESSION: 1. Positive for bilateral acute pulmonary emboli. Bilateral upper and lower lobe involvement, but no saddle embolus or CTA evidence  of right heart strain. 2. No pleural effusion or pulmonary infarct. 3. Partially visible hydronephrosis and bilateral ureteral stents in the upper abdomen. Critical Value/emergent results were called by telephone at the time of interpretation on 10/22/2017 at 7:43 pm to Dr. Alferd Apa , who verbally acknowledged these results. Electronically Signed   By: Genevie Ann M.D.   On: 10/22/2017 19:48    Medications: I have reviewed the patient's current medications.  Assessment/Plan: 1.Metastatic colorectal cancer-status post an exploratory laparotomy 03/03/2016 revealing a proximal rectal mass, peritoneal carcinomatosis, and liver metastases  Biopsy of peritoneal nodules 03/03/2016 confirmed metastatic adenocarcinoma consistent with a colon primary  Foundation 1 testing-MSI-stable, tumor mutation burden-low, no BRAF NRAS or KRAS mutation  Cycle 1 FOLFIRI/PANITUMUMAB 04/14/2016  Cycle 2 FOLFIRI/panitumumab 04/28/2016  Cycle 3 FOLFIRI/panitumumab 05/12/2016  Cycle 4 FOLFIRI/panitumumab 05/27/2016  Cycle 5 FOLFIRI/panitumumab 06/09/2016  Restaging CT abdomen/pelvis 06/20/2016-no evidence of disease progression, decreased left hepatic lesion  Cycle 6 FOLFIRI/panitumumab 06/23/2016  Cycle 7 FOLFIRI/panitumumab 07/07/2016  Cycle 8 FOLFIRI/panitumumab 07/21/2016  Cycle 9 FOLFIRI/PANITUMUMAB 08/04/2016  Cycle 10 FOLFIRI/panitumumab 08/18/2016  Restaging CT 08/29/2016 with interval decrease in the size of the medial segment left liver lesion. Lesion identified previously in the rectosigmoid colon not evident on the current study.  5-FU/PANITUMUMAB 09/01/2016  Xeloda 7 days on/7 days off and PANITUMUMAB every 3 weeks 09/15/2016 (awaiting insurance approval for Xeloda 09/15/2016)  CT abdomen/pelvis 12/25/2016-unchanged 5 mm right hepatic lesion, resolution of medial segment left liver lesion, no new liver lesion. Residual soft tissue fullness in the sigmoid , persistent distal esophageal wall  thickening  Continuationof PANITUMUMAB every 3 weeks and Xeloda 7 days on/7 days off  CT 06/19/2017-new cystic/solid lesion in the left adnexa  Xeloda/panitumumab continued  CT 10/08/2016-progression of cystic pelvic mass  Cycle 1 FOLFIRI/Panitumumab 10/21/2017  2.History of aBowel obstruction secondary to #1  3. Chronic back pain  4. Essential thrombocytosis  5. Early obstruction of the left ureter noted at the time of surgery 03/03/2016-Dr. Tannenbaum placed a left double-J stent 03/18/2016  New onset right hydronephrosis 10/11/2017  Right ureter stent placement 10/13/2017  6. Abdominal wall cellulitis 03/10/2016, blood cultures positive for coagulase negative staphylococcus-methicillin-resistant, status post treatment with vancomycin and Bactrim  7. Tachycardia-persistent following discharge from the hospital, likely related to anemia and deconditioning  CT chest 04/04/2016-negative for pulmonary embolism.  8. Port-A-Cath placement 04/10/2016, interventional radiology  9. Iron deficiency anemia. Feraheme 11/05/2015 , 11/12/2015,and 04/14/2016-persistent anemia and red cell microcytosis, oral iron resumed 11/17/2016-stable  10. Pain/tenderness left posterior iliac region-resolved  11. Hypokalemia-started on potassium replacement 06/09/2016  12. Skin rashand paronychiasecondary to [facility], she continues minocycline, moisturizers, and fluticasone  13.Acute onset right low back pain 10/11/2017-likely secondary to obstruction of the right kidney;left ureter stent exchange and right ureter stent placement 10/13/2017  14.Left leg pain and edema-bilateral lower extremity venous Doppler negative for DVT 10/13/2017 and 10/19/2017. I suspect she has a pelvic DVT  15.  Admission 10/22/2017 with bilateral pulmonary embolism, heparin drip initiated, converted to Lovenox 10/23/2017   Carolyn Barton appears improved from a respiratory standpoint.  She  is stable for discharge to home on Lovenox.  We will plan conversion to Xarelto next week.  The nausea is likely delayed nausea from irinotecan.  She has Zofran and Compazine at home.  Recommendations: 1.  Continue therapeutic Lovenox, twice daily dosing, plan to  convert to Xarelto after approximately 5 days 2.  Zofran and Compazine as needed for nausea 3.  Continue methadone and morphine for pain 4.  Outpatient follow-up at the Cancer center 10/28/2017     LOS: 1 day   [name] Coder, MD   10/24/2017, 8:19 AM

## 2017-10-24 NOTE — Discharge Summary (Signed)
Physician Discharge Summary  NEVEEN DAPONTE MHD:[id number] DOB: Feb 05, 1956 DOA: 10/22/2017  PCP: Marton Redwood, MD  Admit date: 10/22/2017 Discharge date: 10/24/2017  Recommendations for Outpatient Follow-up:  1. F/u bilateral PE currently on enoxaparin  Follow-up Information    Ladell Pier, MD. Go on 10/28/2017.   Specialty:  Oncology Why:  keep appointment for Wednesday Contact information: [place] [address] [phone]            Discharge Diagnoses:  1. Bilateral acute pulmonary emboli of the upper and lower lobes. 2. Metastatic colon cancer 3. Essential thrombocytosis 4. Normocytic anemia 5. Chronic pain  Discharge Condition: improved  Disposition: home  Diet recommendation: regular  Filed Weights   10/22/17 2010 10/23/17 0440 10/24/17 0500  Weight: 62.6 kg (138 lb) 66.9 kg (147 lb 7.8 oz) 66.8 kg (147 lb 4.3 oz)    History of present illness:  21yow PMH metastatic colon cancer currently undergoing chemotherapy per Dr. Benay Spice presented with SOB/DOE. Admitted for bilateral acute PE without hypoxia.  Hospital Course:  Patient was started on heparin and transitioned to enoxaparin per oncology recommendation. She will f/u with Dr. Benay Spice next week. No hypoxia or chest pain. Hospitalization was uncomplicated.   Bilateral acute pulmonary emboli of the upper and lower lobes.There is no saddle embolus and no right heart strain. - discussed with Dr. Benay Spice, vontinue enoxaparin 1 mg/kg BID (60). He will see in office next Wednesday and determine whether aspiration will proceed. He will transition patient to Xarelto when appropriate.  Metastatic colon cancer - currently undergoing chemotherapy per Dr. Benay Spice  Essential thrombocytosis - stable  Chronic pain - continue methadone, morphine  Consultants:  Oncology   Today's assessment: S: feels fine, no CP. Ready to go home. O: Vitals:  Vitals:   10/23/17  2055 10/24/17 0518  BP: (!) 145/97 (!) 146/99  Pulse: 91 99  Resp: 19 20  Temp: 98.7 F (37.1 C) 98.9 F (37.2 C)  SpO2: 99% 99%    Constitutional:  . Appears calm and comfortable Respiratory:  . CTA bilaterally, no w/r/r.  . Respiratory effort normal. No retractions or accessory muscle use Cardiovascular:  . RRR, no m/r/g . No LE extremity edema   Psychiatric:  . judgement and insight appear normal    Discharge Instructions  Discharge Instructions    Activity as tolerated - No restrictions   Complete by:  As directed    Diet general   Complete by:  As directed    Discharge instructions   Complete by:  As directed    Call your physician or seek immediate medical attention for pain, bleeding, fever or worsening of condition.     Allergies as of 10/24/2017   No Known Allergies     Medication List    TAKE these medications   capecitabine 500 MG tablet Commonly known as:  XELODA TAKE 3 TABLETS (1500 MG) IN AM AND 2 TABLETS (1000 MG) IN PM-TOTAL 2500 MG DAILY. TAKE ON DAYS 1-7 AND 15-21 OF EACH 28 DAY CYCLE.   DULoxetine 60 MG capsule Commonly known as:  CYMBALTA Take 1 capsule (60 mg total) by mouth every evening.   enoxaparin 60 MG/0.6ML injection Commonly known as:  LOVENOX Inject 0.6 mLs (60 mg total) into the skin every 12 (twelve) hours for 9 doses.   ferrous sulfate 325 (65 FE) MG EC tablet Take 1 tablet (325 mg total) by mouth daily.   fluticasone 0.05 % cream Commonly known as:  CUTIVATE  Apply topically 2 (two) times daily.   HYDROmorphone 4 MG tablet Commonly known as:  DILAUDID Take 4 mg by mouth every 4 (four) hours as needed for severe pain.   ketorolac 10 MG tablet Commonly known as:  TORADOL Take 1 tablet (10 mg total) by mouth every 8 (eight) hours. for pain   lidocaine-prilocaine cream Commonly known as:  EMLA Apply to port site one hour prior to use. Do not rub in. Cover with plastic.   methadone 10 MG tablet Commonly known as:   DOLOPHINE Take 10 mg by mouth 3 (three) times daily.   minocycline 100 MG capsule Commonly known as:  MINOCIN Take 1 capsule (100 mg total) by mouth 2 (two) times daily.   morphine 15 MG tablet Commonly known as:  MSIR Take 1-2 tablets (15-30 mg total) by mouth every 4 (four) hours as needed for severe pain.   omeprazole 20 MG capsule Commonly known as:  PRILOSEC Take 1 capsule (20 mg total) by mouth daily.   ondansetron 8 MG tablet Commonly known as:  ZOFRAN Take 1 tablet (8 mg total) by mouth every 8 (eight) hours as needed for nausea or vomiting.   phenazopyridine 200 MG tablet Commonly known as:  PYRIDIUM Take 1 tablet (200 mg total) by mouth 3 (three) times daily as needed for pain.   polyethylene glycol packet Commonly known as:  MIRALAX / GLYCOLAX Take 17 g by mouth daily as needed for mild constipation.   potassium chloride 10 MEQ tablet Commonly known as:  K-DUR Take 1 tablet (10 mEq total) by mouth 2 (two) times daily.   prochlorperazine 10 MG tablet Commonly known as:  COMPAZINE Take 1 tablet (10 mg total) by mouth every 6 (six) hours as needed for nausea or vomiting.   sorbitol 70 % solution Take 30 cc twice today, then 30 cc daily   URIBEL 118 MG Caps Take 1 capsule (118 mg total) by mouth 3 times/day as needed-between meals & bedtime.      No Known Allergies  The results of significant diagnostics from this hospitalization (including imaging, microbiology, ancillary and laboratory) are listed below for reference.    Significant Diagnostic Studies: Dg Chest 2 View  Result Date: 10/22/2017 CLINICAL DATA:  Shortness of breath for several hours EXAM: CHEST  2 VIEW COMPARISON:  03/18/2016 FINDINGS: Cardiac shadow is within normal limits. Right-sided chest wall port is noted in satisfactory position. The lungs are well aerated bilaterally without focal infiltrate or sizable effusion. Bilateral ureteral stents are noted. No bony abnormality is seen.  IMPRESSION: No active cardiopulmonary disease. Electronically Signed   By: Inez Catalina M.D.   On: 10/22/2017 18:22   Ct Angio Chest Pe W/cm &/or Wo Cm  Result Date: 10/22/2017 CLINICAL DATA:  62 year old female with increase shortness of breath, metastatic colorectal cancer undergoing chemotherapy. EXAM: CT ANGIOGRAPHY CHEST WITH CONTRAST TECHNIQUE: Multidetector CT imaging of the chest was performed using the standard protocol during bolus administration of intravenous contrast. Multiplanar CT image reconstructions and MIPs were obtained to evaluate the vascular anatomy. CONTRAST:  160mL ISOVUE-370 IOPAMIDOL (ISOVUE-370) INJECTION 76% COMPARISON:  CTA chest 04/04/2016 FINDINGS: Cardiovascular: Good contrast bolus timing in the pulmonary arterial tree. Positive bilateral pulmonary artery filling defects consistent with pulmonary emboli. Left upper and lower lobe involvement beginning at the left hilum. Similar right upper lobe, right middle lobe and lower lobe involvement. No saddle embolus. RV / LV ratio = 0.8 No pericardial effusion. Negative visible aorta. Right chest porta cath  in place. Mediastinum/Nodes: No mediastinal lymphadenopathy. Moderate size gastric hiatal hernia is stable since 2017. Lungs/Pleura: Major airways are patent. No pleural effusion. No abnormal pulmonary opacity. Upper Abdomen: Partially visible left hydronephrosis and hydroureter with a left ureteral stent in place. Partially visible right hydronephrosis, with faintly visible proximal aspect of a right ureteral stent. No upper abdominal free fluid. Negative visible liver, gallbladder, spleen, pancreas, adrenal glands, and bowel in the upper abdomen. Musculoskeletal: Osteopenia. No acute osseous abnormality identified. Review of the MIP images confirms the above findings. IMPRESSION: 1. Positive for bilateral acute pulmonary emboli. Bilateral upper and lower lobe involvement, but no saddle embolus or CTA evidence of right heart strain.  2. No pleural effusion or pulmonary infarct. 3. Partially visible hydronephrosis and bilateral ureteral stents in the upper abdomen. Critical Value/emergent results were called by telephone at the time of interpretation on 10/22/2017 at 7:43 pm to Dr. Alferd Apa , who verbally acknowledged these results. Electronically Signed   By: Genevie Ann M.D.   On: 10/22/2017 19:48   Labs: Basic Metabolic Panel: Recent Labs  Lab 10/21/17 0739 10/22/17 1746 10/22/17 1759 10/23/17 0252  NA 132* 135 134* 134*  K 4.1 4.0 4.2 3.7  CL 98 98* 99* 100*  CO2 25 25  --  26  GLUCOSE 111 100* 100* 119*  BUN 13 20 21* 17  CREATININE  --  0.89 0.80 0.87  CALCIUM 9.4 9.4  --  8.6*  MG 2.1  --   --   --    Liver Function Tests: Recent Labs  Lab 10/21/17 0739  AST 14  ALT 11  ALKPHOS 107  BILITOT 0.5  PROT 7.0  ALBUMIN 2.9*   CBC: Recent Labs  Lab 10/21/17 0739 10/22/17 1746 10/22/17 1759 10/23/17 0252 10/24/17 0419  WBC 12.4* 10.8*  --  10.8* 10.2  NEUTROABS 9.3* 8.8*  --   --   --   HGB  --  14.2 16.0* 12.6 11.6*  HCT 40.3 43.1 47.0* 38.2 35.2*  MCV 90.0 88.7  --  88.8 89.3  PLT 643* 783*  --  763* 669*   Cardiac Enzymes: Recent Labs  Lab 10/22/17 2116  TROPONINI <0.03    Recent Labs    10/22/17 2331  BNP 35.4    Principal Problem:   Bilateral pulmonary embolism (HCC) Active Problems:   Essential thrombocytosis (HCC)   Metastatic colorectal cancer (HCC)   Chronic pain   Acute pulmonary embolism ([place])   Time coordinating discharge: 20 minutes  Signed:  Murray Hodgkins, MD [facility] 10/24/2017, 5:30 PM

## 2017-10-26 ENCOUNTER — Encounter (HOSPITAL_COMMUNITY): Payer: Self-pay | Admitting: Family Medicine

## 2017-10-26 ENCOUNTER — Inpatient Hospital Stay (HOSPITAL_COMMUNITY)
Admission: EM | Admit: 2017-10-26 | Discharge: 2017-11-02 | DRG: 871 | Disposition: A | Discharge status: Home / Self Care | Payer: BLUE CROSS/BLUE SHIELD | Attending: Internal Medicine | Admitting: Internal Medicine

## 2017-10-26 DIAGNOSIS — D473 Essential (hemorrhagic) thrombocythemia: Secondary | ICD-10-CM | POA: Diagnosis present

## 2017-10-26 DIAGNOSIS — Z933 Colostomy status: Secondary | ICD-10-CM

## 2017-10-26 DIAGNOSIS — C19 Malignant neoplasm of rectosigmoid junction: Secondary | ICD-10-CM | POA: Diagnosis present

## 2017-10-26 DIAGNOSIS — T451X5A Adverse effect of antineoplastic and immunosuppressive drugs, initial encounter: Secondary | ICD-10-CM | POA: Diagnosis present

## 2017-10-26 DIAGNOSIS — R102 Pelvic and perineal pain: Secondary | ICD-10-CM

## 2017-10-26 DIAGNOSIS — C20 Malignant neoplasm of rectum: Secondary | ICD-10-CM | POA: Diagnosis present

## 2017-10-26 DIAGNOSIS — N136 Pyonephrosis: Secondary | ICD-10-CM | POA: Diagnosis present

## 2017-10-26 DIAGNOSIS — I82412 Acute embolism and thrombosis of left femoral vein: Secondary | ICD-10-CM | POA: Diagnosis present

## 2017-10-26 DIAGNOSIS — N10 Acute tubulo-interstitial nephritis: Secondary | ICD-10-CM | POA: Diagnosis present

## 2017-10-26 DIAGNOSIS — Z981 Arthrodesis status: Secondary | ICD-10-CM

## 2017-10-26 DIAGNOSIS — D701 Agranulocytosis secondary to cancer chemotherapy: Secondary | ICD-10-CM | POA: Diagnosis present

## 2017-10-26 DIAGNOSIS — D509 Iron deficiency anemia, unspecified: Secondary | ICD-10-CM | POA: Diagnosis present

## 2017-10-26 DIAGNOSIS — N390 Urinary tract infection, site not specified: Secondary | ICD-10-CM | POA: Diagnosis not present

## 2017-10-26 DIAGNOSIS — R310 Gross hematuria: Secondary | ICD-10-CM | POA: Diagnosis not present

## 2017-10-26 DIAGNOSIS — C7961 Secondary malignant neoplasm of right ovary: Secondary | ICD-10-CM | POA: Diagnosis present

## 2017-10-26 DIAGNOSIS — A419 Sepsis, unspecified organism: Secondary | ICD-10-CM | POA: Diagnosis present

## 2017-10-26 DIAGNOSIS — A4151 Sepsis due to Escherichia coli [E. coli]: Secondary | ICD-10-CM | POA: Diagnosis not present

## 2017-10-26 DIAGNOSIS — R000 Tachycardia, unspecified: Secondary | ICD-10-CM | POA: Diagnosis present

## 2017-10-26 DIAGNOSIS — C787 Secondary malignant neoplasm of liver and intrahepatic bile duct: Secondary | ICD-10-CM | POA: Diagnosis present

## 2017-10-26 DIAGNOSIS — Z87891 Personal history of nicotine dependence: Secondary | ICD-10-CM

## 2017-10-26 DIAGNOSIS — C786 Secondary malignant neoplasm of retroperitoneum and peritoneum: Secondary | ICD-10-CM | POA: Diagnosis present

## 2017-10-26 DIAGNOSIS — Z6822 Body mass index (BMI) 22.0-22.9, adult: Secondary | ICD-10-CM

## 2017-10-26 DIAGNOSIS — I2699 Other pulmonary embolism without acute cor pulmonale: Secondary | ICD-10-CM | POA: Diagnosis present

## 2017-10-26 DIAGNOSIS — G893 Neoplasm related pain (acute) (chronic): Secondary | ICD-10-CM

## 2017-10-26 DIAGNOSIS — Z79899 Other long term (current) drug therapy: Secondary | ICD-10-CM

## 2017-10-26 DIAGNOSIS — I10 Essential (primary) hypertension: Secondary | ICD-10-CM | POA: Diagnosis present

## 2017-10-26 DIAGNOSIS — Z515 Encounter for palliative care: Secondary | ICD-10-CM

## 2017-10-26 DIAGNOSIS — G8929 Other chronic pain: Secondary | ICD-10-CM | POA: Diagnosis present

## 2017-10-26 DIAGNOSIS — Z86711 Personal history of pulmonary embolism: Secondary | ICD-10-CM

## 2017-10-26 DIAGNOSIS — R339 Retention of urine, unspecified: Secondary | ICD-10-CM

## 2017-10-26 DIAGNOSIS — E440 Moderate protein-calorie malnutrition: Secondary | ICD-10-CM | POA: Diagnosis present

## 2017-10-26 LAB — URINALYSIS, ROUTINE W REFLEX MICROSCOPIC
Bilirubin Urine: NEGATIVE
Glucose, UA: NEGATIVE mg/dL
Ketones, ur: NEGATIVE mg/dL
NITRITE: NEGATIVE
PROTEIN: 100 mg/dL — AB
SQUAMOUS EPITHELIAL / LPF: NONE SEEN
Specific Gravity, Urine: 1.01 (ref 1.005–1.030)
pH: 6 (ref 5.0–8.0)

## 2017-10-26 MED ORDER — HYDROMORPHONE HCL 1 MG/ML IJ SOLN
1.0000 mg | Freq: Once | INTRAMUSCULAR | Status: AC
  Administered 2017-10-27: 1 mg via INTRAVENOUS
  Filled 2017-10-26: qty 1

## 2017-10-26 NOTE — ED Triage Notes (Signed)
Patient has metastatic colorectal cancer where is receiving chemotherapy. Last treatment was Wednesday. Patient had foley catheter removed today around 9:00am. Patient has urinated a small amount but has been drinking abundant amount of fluids as recommended. Also, reports she has had an increase in left lower extremity swelling.

## 2017-10-26 NOTE — ED Provider Notes (Signed)
[facility] DEPT Provider Note   CSN: [id number] Arrival date & time: 10/26/17  2118     History   Chief Complaint Chief Complaint  Patient presents with  . Urinary Retention  . Leg Swelling    HPI Carolyn Barton is a 62 y.o. female.  HPI  This is a 62 year old female with active colon cancer, chronic back pain, recent diagnosis of PE on Lovenox who presents with urinary retention and left leg pain and swelling.  Patient reports that she had urinary stents placed last week.  She had a catheter removed this morning.  Since that time she has had minimal urine output.  She had progressive abdominal distention and discomfort.  On my evaluation, urinary Foley had been placed with 700- 800 cc of dark urinary output.  Patient reports that her abdominal distention and discomfort has improved.  She reports chronic left lower extremity swelling and pain.  She states pain is worse today and she feels the swelling is worse.  Rates her pain at 10 out of 10.  She took her home morphine prior to arrival with minimal improvement.  She also reports chronic back pain.  She denies any weakness, numbness, tingling of the lower extremities.  She reports that she has had to have Foley catheter on and off several times and relates this to her tumor burden in her abdomen.  She denies any recent fevers, nausea, vomiting.  Past Medical History:  Diagnosis Date  . Chronic back pain    lumbar    . Colostomy in place [facility])   . 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 History of chemotherapy last chemo 09-21-17  . Hypokalemia    takes potassium  . Iron deficiency anemia 04/14/2016   treated w/ Iron infusions  . Liver metastasis ([place])    secondary to colorectal cancer  . Metastatic colorectal cancer [facility]) dx 03/03/2016--- oncologist-- dr Benay Spice   metastatic colorectal carcinoma--  s/p  exp. lap. for bowel obstruction--  rectal mass, peritoneal  carcinomatosis, liver mets---  chemotherapy  . Spinal headache 12/21/2012  . Tachycardia    persistant since discharged from hospital 06/ 2017 due to anemia and deconditioning;  as of 09-02-2016 per pt no issues w/ heart racing in the past few weeks  . Ureteral obstruction, left   . Wears contact lenses     Patient Active Problem List   Diagnosis Date Noted  . Tachycardia 10/27/2017  . Chronic pain 10/23/2017  . Acute pulmonary embolism ([place]) 10/23/2017  . Bilateral pulmonary embolism ([place]) 10/22/2017  . Port-A-Cath in place 07/27/2017  . Port catheter in place 04/14/2016  . Iron deficiency anemia 04/14/2016  . Rectal cancer ([place]) 03/20/2016  . Leukocytosis   . Cellulitis 03/10/2016  . Malnutrition of moderate degree 03/07/2016  . Metastatic colorectal cancer ([place]) 03/03/2016  . Bowel obstruction ([place]) 02/29/2016  . Mass of colon 02/29/2016  . Nausea & vomiting 02/29/2016  . Lumbar radiculopathy 01/26/2014  . Essential thrombocytosis ([place]) 11/10/2011    Past Surgical History:  Procedure Laterality Date  . BIOPSY N/A 03/03/2016   Procedure: BIOPSY OF PERITONEAL NODULE;  Surgeon: Erroll Luna, MD;  Location: [place];  Service: General;  Laterality: N/A;  . COLOSTOMY N/A 03/03/2016   Procedure: DIVERTING SIGMOID COLOSTOMY;  Surgeon: Erroll Luna, MD;  Location: [place];  Service: General;  Laterality: N/A;  . CYSTOSCOPY W/ RETROGRADES Right 10/13/2017   Procedure: CYSTOSCOPY WITH RETROGRADE PYELOGRAM/ STENT PLACEMENT;  Surgeon:  Ceasar Mons, MD;  Location: [facility];  Service: Urology;  Laterality: Right;  ONLY NEEDS 30 MIN FOR BOTH PROCEDURES  . CYSTOSCOPY W/ URETERAL STENT PLACEMENT Left 10/09/2016   Procedure: CYSTOSCOPY WITH LEFT STENT REPLACEMENT 54F POLARIS STENT 24CM;  Surgeon: Carolan Clines, MD;  Location: [facility];  Service: Urology;  Laterality: Left;  . CYSTOSCOPY W/ URETERAL STENT PLACEMENT Left 03/16/2017   Procedure:  CYSTOSCOPY WITH RETROGRADE PYELOGRAM WITH LEFT  STENT REMOVAL AND REPLACEMENT;  Surgeon: Carolan Clines, MD;  Location: [facility];  Service: Urology;  Laterality: Left;  . CYSTOSCOPY W/ URETERAL STENT PLACEMENT Left 10/13/2017   Procedure: CYSTOSCOPY WITH STENT REPLACEMENT;  Surgeon: Ceasar Mons, MD;  Location: [facility];  Service: Urology;  Laterality: Left;  . CYSTOSCOPY WITH FULGERATION N/A 10/13/2017   Procedure: CYSTOSCOPY WITH FULGERATION;  Surgeon: Ceasar Mons, MD;  Location: [facility];  Service: Urology;  Laterality: N/A;  . CYSTOSCOPY WITH RETROGRADE PYELOGRAM, URETEROSCOPY AND STENT PLACEMENT Left 03/18/2016   Procedure: CYSTOSCOPY WITH LEFT  RETROGRADE PYELOGRAM,  AND  POLARIS STENT PLACEMENT;  Surgeon: Carolan Clines, MD;  Location: [facility];  Service: Urology;  Laterality: Left;  . LAPAROTOMY N/A 03/03/2016   Procedure: EXPLORATORY LAPAROTOMY;  Surgeon: Erroll Luna, MD;  Location: [place];  Service: General;  Laterality: N/A;  . LUMBAR LAMINECTOMY  11/11/2012   L4-5  and Resection synovial cyst  . MAXIMUM ACCESS (MAS)POSTERIOR LUMBAR INTERBODY FUSION (PLIF) 1 LEVEL N/A 01/11/2014   Procedure: FOR MAXIMUM ACCESS (MAS) POSTERIOR LUMBAR INTERBODY FUSION Lumbar Five Sacral One;  Surgeon: [name], MD;  Location: [place] [facility];  Service: Neurosurgery;  Laterality: N/A;  FOR MAXIMUM ACCESS (MAS) POSTERIOR LUMBAR INTERBODY FUSION Lumbar Five Sacral One  . PORTACATH PLACEMENT  04/10/2016  . REPAIR CEREBROSPINAL FLUID LEAK POST RESECTION SYNOVIAL CYST  12/21/2012  . REVISION LUMBAR HARDWARE  01/26/2014  . TRANSTHORACIC ECHOCARDIOGRAM  06/17/2016   grade 1 diastolic function , ef 83-15%/  trivial TR  . WISDOM TOOTH EXTRACTION      OB History    No data available       Home Medications    Prior to Admission medications   Medication Sig Start Date End Date Taking? Authorizing Provider  capecitabine  (XELODA) 500 MG tablet TAKE 3 TABLETS (1500 MG) IN AM AND 2 TABLETS (1000 MG) IN PM-TOTAL 2500 MG DAILY. TAKE ON DAYS 1-7 AND 15-21 OF EACH 28 DAY CYCLE. 08/24/17  Yes Ladell Pier, MD  DULoxetine (CYMBALTA) 60 MG capsule Take 1 capsule (60 mg total) by mouth every evening. 06/15/17  Yes Ladell Pier, MD  enoxaparin (LOVENOX) 60 MG/0.6ML injection Inject 0.6 mLs (60 mg total) into the skin every 12 (twelve) hours for 9 doses. 10/24/17 10/29/17 Yes Samuella Cota, MD  fluticasone (CUTIVATE) 0.05 % cream Apply topically 2 (two) times daily. 11/17/16  Yes Ladell Pier, MD  HYDROmorphone (DILAUDID) 4 MG tablet Take 4 mg by mouth every 4 (four) hours as needed for severe pain.    Yes [provider]  lidocaine-prilocaine (EMLA) cream Apply to port site one hour prior to use. Do not rub in. Cover with plastic. 03/02/17  Yes Owens Shark, NP  Meth-Hyo-M Barnett Hatter Phos-Ph Sal (URIBEL) 118 MG CAPS Take 1 capsule (118 mg total) by mouth 3 times/day as needed-between meals & bedtime. 03/16/17  Yes Carolan Clines, MD  methadone (DOLOPHINE) 10 MG tablet Take 10 mg by  mouth 3 (three) times daily.    Yes [provider]  minocycline (MINOCIN) 100 MG capsule Take 1 capsule (100 mg total) by mouth 2 (two) times daily. 12/08/16  Yes Ladell Pier, MD  morphine (MSIR) 15 MG tablet Take 1-2 tablets (15-30 mg total) by mouth every 4 (four) hours as needed for severe pain. 10/21/17  Yes Ladell Pier, MD  omeprazole (PRILOSEC) 20 MG capsule Take 1 capsule (20 mg total) by mouth daily. Patient taking differently: Take 20 mg by mouth daily as needed (reflux).  10/21/17  Yes Ladell Pier, MD  ondansetron (ZOFRAN) 8 MG tablet Take 1 tablet (8 mg total) by mouth every 8 (eight) hours as needed for nausea or vomiting. 04/24/17  Yes Owens Shark, NP  polyethylene glycol (MIRALAX / GLYCOLAX) packet Take 17 g by mouth daily as needed for mild constipation.    Yes [provider]    potassium chloride (K-DUR) 10 MEQ tablet Take 1 tablet (10 mEq total) by mouth 2 (two) times daily. 09/21/17  Yes Ladell Pier, MD  prochlorperazine (COMPAZINE) 10 MG tablet Take 1 tablet (10 mg total) by mouth every 6 (six) hours as needed for nausea or vomiting. 09/15/16  Yes Owens Shark, NP  sorbitol 70 % solution Take 30 cc twice today, then 30 cc daily 10/19/17  Yes Owens Shark, NP  ferrous sulfate 325 (65 FE) MG EC tablet Take 1 tablet (325 mg total) by mouth daily. Patient not taking: Reported on 10/27/2017 11/17/16   Ladell Pier, MD  ketorolac (TORADOL) 10 MG tablet Take 1 tablet (10 mg total) by mouth every 8 (eight) hours. for pain Patient not taking: Reported on 10/27/2017 10/12/17   Ladell Pier, MD  phenazopyridine (PYRIDIUM) 200 MG tablet Take 1 tablet (200 mg total) by mouth 3 (three) times daily as needed for pain. Patient not taking: Reported on 10/22/2017 10/13/17 10/13/18  Ceasar Mons, MD    Family History Family History  Problem Relation Age of Onset  . Heart disease Mother   . Melanoma Mother   . Colon cancer Neg Hx   . Colon polyps Neg Hx   . Rectal cancer Neg Hx   . Stomach cancer Neg Hx   . Esophageal cancer Neg Hx     Social History Social History   Tobacco Use  . Smoking status: Former Smoker    Packs/day: 0.25    Years: 1.00    Pack years: 0.25    Types: Cigarettes    Last attempt to quit: 10/03/1978    Years since quitting: 39.0  . Smokeless tobacco: Never Used  Substance Use Topics  . Alcohol use: No    Alcohol/week: 0.0 oz  . Drug use: No     Allergies   Patient has no known allergies.   Review of Systems Review of Systems  Constitutional: Negative for fever.  Respiratory: Negative for shortness of breath.   Cardiovascular: Positive for leg swelling. Negative for chest pain.  Gastrointestinal: Positive for abdominal pain. Negative for nausea and vomiting.  Genitourinary: Positive for difficulty urinating.   Musculoskeletal: Positive for back pain.       Left leg pain  All other systems reviewed and are negative.    Physical Exam Updated Vital Signs BP (!) 167/93 (BP Location: Right Arm)   Pulse (!) 134   Temp 97.8 F (36.6 C) (Oral)   Resp (!) 24   Ht 5\' 5"  (1.651 m)  Wt 62.6 kg (138 lb)   SpO2 98%   BMI 22.96 kg/m   Physical Exam  Constitutional: She is oriented to person, place, and time.  Tonically ill-appearing, no acute distress  HENT:  Head: Normocephalic and atraumatic.  Cardiovascular: Regular rhythm and normal heart sounds.  Tachycardia  Pulmonary/Chest: Effort normal. No respiratory distress. She has no wheezes.  Abdominal: Soft. Bowel sounds are normal. There is no tenderness.  Extensive scarring of the abdomen, left lower quadrant colostomy with brown stool, abdomen soft  Musculoskeletal: She exhibits edema.  Left greater than right lower extremity edema, pitting up to the hip  Neurological: She is alert and oriented to person, place, and time.  5 out of 5 strength bilateral lower extremities, equal reflexes bilaterally  Skin: Skin is warm and dry.  Psychiatric: She has a normal mood and affect.  Nursing note and vitals reviewed.    ED Treatments / Results  Labs (all labs ordered are listed, but only abnormal results are displayed) Labs Reviewed  URINALYSIS, ROUTINE W REFLEX MICROSCOPIC - Abnormal; Notable for the following components:      Result Value   Color, Urine AMBER (*)    APPearance CLOUDY (*)    Hgb urine dipstick LARGE (*)    Protein, ur 100 (*)    Leukocytes, UA LARGE (*)    Bacteria, UA MANY (*)    All other components within normal limits  CBC WITH DIFFERENTIAL/PLATELET - Abnormal; Notable for the following components:   WBC 1.2 (*)    Neutro Abs 1.0 (*)    Lymphs Abs 0.1 (*)    Monocytes Absolute 0.0 (*)    All other components within normal limits  BASIC METABOLIC PANEL - Abnormal; Notable for the following components:   Sodium  134 (*)    Chloride 99 (*)    Glucose, Bld 139 (*)    Calcium 7.8 (*)    All other components within normal limits  URINE CULTURE  CULTURE, BLOOD (ROUTINE X 2)  CULTURE, BLOOD (ROUTINE X 2)  I-STAT CG4 LACTIC ACID, ED    EKG  EKG Interpretation None       Radiology No results found.  Procedures Procedures (including critical care time)  CRITICAL CARE Performed by: Merryl Hacker   Total critical care time: 30 minutes  Critical care time was exclusive of separately billable procedures and treating other patients.  Critical care was necessary to treat or prevent imminent or life-threatening deterioration.  Critical care was time spent personally by me on the following activities: development of treatment plan with patient and/or surrogate as well as nursing, discussions with consultants, evaluation of patient's response to treatment, examination of patient, obtaining history from patient or surrogate, ordering and performing treatments and interventions, ordering and review of laboratory studies, ordering and review of radiographic studies, pulse oximetry and re-evaluation of patient's condition.   Medications Ordered in ED Medications  sodium chloride 0.9 % bolus 1,000 mL (not administered)  HYDROmorphone (DILAUDID) injection 1 mg (1 mg Intravenous Given 10/27/17 0004)  sodium chloride 0.9 % bolus 1,000 mL (0 mLs Intravenous Stopped 10/27/17 0211)  HYDROmorphone (DILAUDID) injection 1 mg (1 mg Intravenous Given 10/27/17 0133)  ceFEPIme (MAXIPIME) 2 g in dextrose 5 % 50 mL IVPB (0 g Intravenous Stopped 10/27/17 0211)  HYDROmorphone (DILAUDID) injection 1 mg (1 mg Intravenous Given 10/27/17 0158)     Initial Impression / Assessment and Plan / ED Course  I have reviewed the triage vital signs and the  nursing notes.  Pertinent labs & imaging results that were available during my care of the patient were reviewed by me and considered in my medical decision making (see  chart for details).  Clinical Course as of Oct 28 211  Tue Oct 27, 2017  0104 CBC with white count of 1.2.  Absolute neutrophil count of 1.0.  This is likely related to chemotherapy but could also reflect sepsis.  Urinalysis with large leukocyte esterase, too numerous to count white cells and too numerous to count red cells.  It does not appear that she has been on any antibiotics.  She has stent placement 1 week ago and has had an indwelling Foley.  No fevers but she is immunosuppressed.  Blood cultures obtained.  Patient given fluids.  Will give IV cefepime.  While she does not appear floridly septic and blood pressure and temperature are reassuring, she certainly is at risk for bacteremia.  Likely admission.  [CH]  0212 With continued 10 out of 10 pain after multiple doses of Dilaudid.  Pulse rate still ranging between 125 and 140.   [CH]    Clinical Course User Index [CH] Horton, Barbette Hair, MD    Patient presents with urinary retention.  Also reports back pain and left leg pain which appear acute on chronic.  She is tachycardic in the 140s.  Otherwise afebrile and blood pressure is reassuring.  She is chronically ill-appearing.  Very dark urine in Foley bag.  Patient was given fluids and Dilaudid for pain.  Lab work was obtained given recent chemotherapy.  Urinalysis shows too numerous to count red cells and white cells.  This is difficult to interpret with recent indwelling Foley catheter; however, I have reviewed her chart I do not see where she is taking any prophylactic antibiotics.  She is afebrile but she is technically neutropenic with neutrophil count of 8000.  For this reason, blood cultures obtained.  Patient given cefepime.  Lactate is normal.  She does not appear septic at this time but is certainly at risk for bacteremia.  She has continued 10 out of 10 pain.  She was redosed pain medications multiple times.  Do not feel that imaging would likely change her course; however, will obtain  a KUB to ensure stents appear in place.  Plan for admission to the hospitalist service.  After history, exam, and medical workup I feel the patient has been appropriately medically screened and is safe for discharge home. Pertinent diagnoses were discussed with the patient. Patient was given return precautions.   Final Clinical Impressions(s) / ED Diagnoses   Final diagnoses:  Lower urinary tract infectious disease  Urinary retention  Chemotherapy-induced neutropenia [facility])    ED Discharge Orders    None       Dina Rich, Barbette Hair, MD 10/27/17 [phone]

## 2017-10-27 ENCOUNTER — Other Ambulatory Visit: Payer: Self-pay

## 2017-10-27 ENCOUNTER — Encounter (HOSPITAL_COMMUNITY): Payer: Self-pay | Admitting: Internal Medicine

## 2017-10-27 ENCOUNTER — Observation Stay (HOSPITAL_COMMUNITY): Payer: BLUE CROSS/BLUE SHIELD

## 2017-10-27 ENCOUNTER — Encounter: Payer: Self-pay | Admitting: General Practice

## 2017-10-27 ENCOUNTER — Ambulatory Visit: Payer: BLUE CROSS/BLUE SHIELD | Admitting: Oncology

## 2017-10-27 DIAGNOSIS — Z981 Arthrodesis status: Secondary | ICD-10-CM | POA: Diagnosis not present

## 2017-10-27 DIAGNOSIS — Z515 Encounter for palliative care: Secondary | ICD-10-CM | POA: Diagnosis not present

## 2017-10-27 DIAGNOSIS — D701 Agranulocytosis secondary to cancer chemotherapy: Secondary | ICD-10-CM

## 2017-10-27 DIAGNOSIS — Z7901 Long term (current) use of anticoagulants: Secondary | ICD-10-CM | POA: Diagnosis not present

## 2017-10-27 DIAGNOSIS — Z6822 Body mass index (BMI) 22.0-22.9, adult: Secondary | ICD-10-CM | POA: Diagnosis not present

## 2017-10-27 DIAGNOSIS — N10 Acute tubulo-interstitial nephritis: Secondary | ICD-10-CM | POA: Diagnosis not present

## 2017-10-27 DIAGNOSIS — D509 Iron deficiency anemia, unspecified: Secondary | ICD-10-CM | POA: Diagnosis present

## 2017-10-27 DIAGNOSIS — R339 Retention of urine, unspecified: Secondary | ICD-10-CM

## 2017-10-27 DIAGNOSIS — C20 Malignant neoplasm of rectum: Secondary | ICD-10-CM | POA: Diagnosis not present

## 2017-10-27 DIAGNOSIS — Z86711 Personal history of pulmonary embolism: Secondary | ICD-10-CM | POA: Diagnosis not present

## 2017-10-27 DIAGNOSIS — N390 Urinary tract infection, site not specified: Secondary | ICD-10-CM | POA: Diagnosis present

## 2017-10-27 DIAGNOSIS — A4151 Sepsis due to Escherichia coli [E. coli]: Secondary | ICD-10-CM | POA: Diagnosis present

## 2017-10-27 DIAGNOSIS — I2699 Other pulmonary embolism without acute cor pulmonale: Secondary | ICD-10-CM | POA: Diagnosis present

## 2017-10-27 DIAGNOSIS — I82412 Acute embolism and thrombosis of left femoral vein: Secondary | ICD-10-CM | POA: Diagnosis present

## 2017-10-27 DIAGNOSIS — N136 Pyonephrosis: Secondary | ICD-10-CM | POA: Diagnosis present

## 2017-10-27 DIAGNOSIS — G8929 Other chronic pain: Secondary | ICD-10-CM | POA: Diagnosis present

## 2017-10-27 DIAGNOSIS — T451X5A Adverse effect of antineoplastic and immunosuppressive drugs, initial encounter: Secondary | ICD-10-CM | POA: Diagnosis not present

## 2017-10-27 DIAGNOSIS — R310 Gross hematuria: Secondary | ICD-10-CM | POA: Diagnosis not present

## 2017-10-27 DIAGNOSIS — C785 Secondary malignant neoplasm of large intestine and rectum: Secondary | ICD-10-CM | POA: Diagnosis not present

## 2017-10-27 DIAGNOSIS — R102 Pelvic and perineal pain: Secondary | ICD-10-CM | POA: Diagnosis not present

## 2017-10-27 DIAGNOSIS — R600 Localized edema: Secondary | ICD-10-CM | POA: Diagnosis not present

## 2017-10-27 DIAGNOSIS — A419 Sepsis, unspecified organism: Secondary | ICD-10-CM | POA: Diagnosis present

## 2017-10-27 DIAGNOSIS — C786 Secondary malignant neoplasm of retroperitoneum and peritoneum: Secondary | ICD-10-CM | POA: Diagnosis present

## 2017-10-27 DIAGNOSIS — G893 Neoplasm related pain (acute) (chronic): Secondary | ICD-10-CM | POA: Diagnosis not present

## 2017-10-27 DIAGNOSIS — C7961 Secondary malignant neoplasm of right ovary: Secondary | ICD-10-CM | POA: Diagnosis present

## 2017-10-27 DIAGNOSIS — C787 Secondary malignant neoplasm of liver and intrahepatic bile duct: Secondary | ICD-10-CM | POA: Diagnosis present

## 2017-10-27 DIAGNOSIS — D473 Essential (hemorrhagic) thrombocythemia: Secondary | ICD-10-CM | POA: Diagnosis present

## 2017-10-27 DIAGNOSIS — Z87891 Personal history of nicotine dependence: Secondary | ICD-10-CM | POA: Diagnosis not present

## 2017-10-27 DIAGNOSIS — Z933 Colostomy status: Secondary | ICD-10-CM | POA: Diagnosis not present

## 2017-10-27 DIAGNOSIS — R000 Tachycardia, unspecified: Secondary | ICD-10-CM | POA: Diagnosis present

## 2017-10-27 DIAGNOSIS — Z79899 Other long term (current) drug therapy: Secondary | ICD-10-CM | POA: Diagnosis not present

## 2017-10-27 DIAGNOSIS — I10 Essential (primary) hypertension: Secondary | ICD-10-CM | POA: Diagnosis present

## 2017-10-27 DIAGNOSIS — E440 Moderate protein-calorie malnutrition: Secondary | ICD-10-CM | POA: Diagnosis present

## 2017-10-27 DIAGNOSIS — C19 Malignant neoplasm of rectosigmoid junction: Secondary | ICD-10-CM | POA: Diagnosis present

## 2017-10-27 LAB — CBC WITH DIFFERENTIAL/PLATELET
BASOS ABS: 0 10*3/uL (ref 0.0–0.1)
BASOS PCT: 0 %
EOS PCT: 1 %
Eosinophils Absolute: 0 10*3/uL (ref 0.0–0.7)
HCT: 36.8 % (ref 36.0–46.0)
Hemoglobin: 12.2 g/dL (ref 12.0–15.0)
LYMPHS PCT: 10 %
Lymphs Abs: 0.1 10*3/uL — ABNORMAL LOW (ref 0.7–4.0)
MCH: 28.8 pg (ref 26.0–34.0)
MCHC: 33.2 g/dL (ref 30.0–36.0)
MCV: 86.8 fL (ref 78.0–100.0)
Monocytes Absolute: 0 10*3/uL — ABNORMAL LOW (ref 0.1–1.0)
Monocytes Relative: 0 %
Neutro Abs: 1 10*3/uL — ABNORMAL LOW (ref 1.7–7.7)
Neutrophils Relative %: 89 %
PLATELETS: 286 10*3/uL (ref 150–400)
RBC: 4.24 MIL/uL (ref 3.87–5.11)
RDW: 15.5 % (ref 11.5–15.5)
WBC: 1.2 10*3/uL — AB (ref 4.0–10.5)

## 2017-10-27 LAB — BLOOD CULTURE ID PANEL (REFLEXED)
ACINETOBACTER BAUMANNII: NOT DETECTED
CANDIDA ALBICANS: NOT DETECTED
CANDIDA PARAPSILOSIS: NOT DETECTED
CANDIDA TROPICALIS: NOT DETECTED
CARBAPENEM RESISTANCE: NOT DETECTED
Candida glabrata: NOT DETECTED
Candida krusei: NOT DETECTED
ENTEROBACTER CLOACAE COMPLEX: NOT DETECTED
Enterobacteriaceae species: DETECTED — AB
Enterococcus species: NOT DETECTED
Escherichia coli: DETECTED — AB
HAEMOPHILUS INFLUENZAE: NOT DETECTED
KLEBSIELLA PNEUMONIAE: NOT DETECTED
Klebsiella oxytoca: NOT DETECTED
Listeria monocytogenes: NOT DETECTED
NEISSERIA MENINGITIDIS: NOT DETECTED
PROTEUS SPECIES: NOT DETECTED
Pseudomonas aeruginosa: NOT DETECTED
SERRATIA MARCESCENS: NOT DETECTED
STAPHYLOCOCCUS SPECIES: NOT DETECTED
STREPTOCOCCUS AGALACTIAE: NOT DETECTED
STREPTOCOCCUS SPECIES: NOT DETECTED
Staphylococcus aureus (BCID): NOT DETECTED
Streptococcus pneumoniae: NOT DETECTED
Streptococcus pyogenes: NOT DETECTED

## 2017-10-27 LAB — CBC
HCT: 31.5 % — ABNORMAL LOW (ref 36.0–46.0)
HEMOGLOBIN: 10.7 g/dL — AB (ref 12.0–15.0)
MCH: 29.7 pg (ref 26.0–34.0)
MCHC: 34 g/dL (ref 30.0–36.0)
MCV: 87.5 fL (ref 78.0–100.0)
PLATELETS: 197 10*3/uL (ref 150–400)
RBC: 3.6 MIL/uL — AB (ref 3.87–5.11)
RDW: 15.7 % — ABNORMAL HIGH (ref 11.5–15.5)
WBC: 13.8 10*3/uL — AB (ref 4.0–10.5)

## 2017-10-27 LAB — BASIC METABOLIC PANEL
ANION GAP: 12 (ref 5–15)
ANION GAP: 8 (ref 5–15)
BUN: 15 mg/dL (ref 6–20)
BUN: 18 mg/dL (ref 6–20)
CALCIUM: 7.3 mg/dL — AB (ref 8.9–10.3)
CO2: 23 mmol/L (ref 22–32)
CO2: 23 mmol/L (ref 22–32)
Calcium: 7.8 mg/dL — ABNORMAL LOW (ref 8.9–10.3)
Chloride: 101 mmol/L (ref 101–111)
Chloride: 99 mmol/L — ABNORMAL LOW (ref 101–111)
Creatinine, Ser: 0.75 mg/dL (ref 0.44–1.00)
Creatinine, Ser: 0.83 mg/dL (ref 0.44–1.00)
GLUCOSE: 139 mg/dL — AB (ref 65–99)
Glucose, Bld: 114 mg/dL — ABNORMAL HIGH (ref 65–99)
POTASSIUM: 4.5 mmol/L (ref 3.5–5.1)
Potassium: 3.1 mmol/L — ABNORMAL LOW (ref 3.5–5.1)
SODIUM: 132 mmol/L — AB (ref 135–145)
SODIUM: 134 mmol/L — AB (ref 135–145)

## 2017-10-27 LAB — I-STAT CG4 LACTIC ACID, ED: LACTIC ACID, VENOUS: 1.5 mmol/L (ref 0.5–1.9)

## 2017-10-27 MED ORDER — SODIUM CHLORIDE 0.9 % IV BOLUS (SEPSIS)
1000.0000 mL | Freq: Once | INTRAVENOUS | Status: AC
  Administered 2017-10-27: 1000 mL via INTRAVENOUS

## 2017-10-27 MED ORDER — HYDROMORPHONE HCL 1 MG/ML IJ SOLN
1.0000 mg | INTRAMUSCULAR | Status: DC | PRN
  Administered 2017-10-27 – 2017-10-28 (×9): 1 mg via INTRAVENOUS
  Filled 2017-10-27 (×9): qty 1

## 2017-10-27 MED ORDER — SODIUM CHLORIDE 0.9 % IV SOLN
INTRAVENOUS | Status: DC
  Administered 2017-10-27 (×2): via INTRAVENOUS

## 2017-10-27 MED ORDER — SORBITOL 70 % PO SOLN
30.0000 mL | Freq: Every day | ORAL | Status: DC | PRN
  Filled 2017-10-27: qty 30

## 2017-10-27 MED ORDER — ACETAMINOPHEN 325 MG PO TABS
650.0000 mg | ORAL_TABLET | Freq: Four times a day (QID) | ORAL | Status: DC | PRN
  Administered 2017-10-27: 650 mg via ORAL
  Filled 2017-10-27: qty 2

## 2017-10-27 MED ORDER — POTASSIUM CHLORIDE 20 MEQ/15ML (10%) PO SOLN
40.0000 meq | Freq: Once | ORAL | Status: AC
  Administered 2017-10-27: 40 meq via ORAL
  Filled 2017-10-27: qty 30

## 2017-10-27 MED ORDER — CEFEPIME HCL 2 G IJ SOLR
2.0000 g | Freq: Once | INTRAMUSCULAR | Status: AC
  Administered 2017-10-27: 2 g via INTRAVENOUS
  Filled 2017-10-27: qty 2

## 2017-10-27 MED ORDER — ACETAMINOPHEN 650 MG RE SUPP: 650.0000 mg | Freq: Four times a day (QID) | RECTAL | Status: DC | PRN

## 2017-10-27 MED ORDER — DEXTROSE 5 % IV SOLN
1.0000 g | Freq: Three times a day (TID) | INTRAVENOUS | Status: DC
  Administered 2017-10-27 – 2017-10-28 (×3): 1 g via INTRAVENOUS
  Filled 2017-10-27 (×4): qty 1

## 2017-10-27 MED ORDER — IOPAMIDOL (ISOVUE-370) INJECTION 76%
INTRAVENOUS | Status: AC
  Filled 2017-10-27: qty 100

## 2017-10-27 MED ORDER — ONDANSETRON HCL 4 MG PO TABS
4.0000 mg | ORAL_TABLET | Freq: Four times a day (QID) | ORAL | Status: DC | PRN
  Administered 2017-11-02: 4 mg via ORAL
  Filled 2017-10-27: qty 1

## 2017-10-27 MED ORDER — POLYETHYLENE GLYCOL 3350 17 G PO PACK: 17.0000 g | PACK | Freq: Every day | ORAL | Status: DC | PRN

## 2017-10-27 MED ORDER — HYDROMORPHONE HCL 2 MG PO TABS
4.0000 mg | ORAL_TABLET | ORAL | Status: DC | PRN
  Administered 2017-10-27: 4 mg via ORAL
  Filled 2017-10-27: qty 2

## 2017-10-27 MED ORDER — TRAMADOL HCL 50 MG PO TABS
50.0000 mg | ORAL_TABLET | Freq: Once | ORAL | Status: AC
  Administered 2017-10-27: 50 mg via ORAL
  Filled 2017-10-27: qty 1

## 2017-10-27 MED ORDER — PROCHLORPERAZINE MALEATE 10 MG PO TABS
10.0000 mg | ORAL_TABLET | Freq: Four times a day (QID) | ORAL | Status: DC | PRN
  Administered 2017-10-27: 10 mg via ORAL
  Filled 2017-10-27: qty 1

## 2017-10-27 MED ORDER — METHADONE HCL 5 MG PO TABS
12.5000 mg | ORAL_TABLET | Freq: Three times a day (TID) | ORAL | Status: DC
  Administered 2017-10-27 – 2017-10-28 (×4): 12.5 mg via ORAL
  Filled 2017-10-27 (×4): qty 3

## 2017-10-27 MED ORDER — METHADONE HCL 5 MG PO TABS
10.0000 mg | ORAL_TABLET | Freq: Three times a day (TID) | ORAL | Status: DC
  Administered 2017-10-27: 10 mg via ORAL
  Filled 2017-10-27: qty 2

## 2017-10-27 MED ORDER — POTASSIUM CHLORIDE CRYS ER 10 MEQ PO TBCR
10.0000 meq | EXTENDED_RELEASE_TABLET | Freq: Two times a day (BID) | ORAL | Status: DC
  Administered 2017-10-27 – 2017-11-02 (×14): 10 meq via ORAL
  Filled 2017-10-27 (×14): qty 1

## 2017-10-27 MED ORDER — HYDROMORPHONE HCL 1 MG/ML IJ SOLN
1.0000 mg | Freq: Once | INTRAMUSCULAR | Status: AC
  Administered 2017-10-27: 1 mg via INTRAVENOUS
  Filled 2017-10-27: qty 1

## 2017-10-27 MED ORDER — DULOXETINE HCL 60 MG PO CPEP
60.0000 mg | ORAL_CAPSULE | Freq: Every evening | ORAL | Status: DC
  Administered 2017-10-27 – 2017-11-01 (×6): 60 mg via ORAL
  Filled 2017-10-27 (×6): qty 1

## 2017-10-27 MED ORDER — HYDRALAZINE HCL 20 MG/ML IJ SOLN
5.0000 mg | INTRAMUSCULAR | Status: DC | PRN
Start: 2017-10-27 — End: 2017-10-28

## 2017-10-27 MED ORDER — IOPAMIDOL (ISOVUE-370) INJECTION 76%
100.0000 mL | Freq: Once | INTRAVENOUS | Status: AC | PRN
  Administered 2017-10-27: 100 mL via INTRAVENOUS

## 2017-10-27 MED ORDER — POTASSIUM CHLORIDE 20 MEQ PO PACK: 40.0000 meq | PACK | Freq: Once | ORAL | Status: DC

## 2017-10-27 MED ORDER — ONDANSETRON HCL 4 MG/2ML IJ SOLN
4.0000 mg | Freq: Four times a day (QID) | INTRAMUSCULAR | Status: DC | PRN
  Administered 2017-10-27 – 2017-10-31 (×5): 4 mg via INTRAVENOUS
  Filled 2017-10-27 (×5): qty 2

## 2017-10-27 MED ORDER — ENOXAPARIN SODIUM 60 MG/0.6ML ~~LOC~~ SOLN
60.0000 mg | Freq: Two times a day (BID) | SUBCUTANEOUS | Status: DC
  Administered 2017-10-27 – 2017-10-29 (×5): 60 mg via SUBCUTANEOUS
  Filled 2017-10-27 (×5): qty 0.6

## 2017-10-27 MED ORDER — SODIUM CHLORIDE 0.9% FLUSH
10.0000 mL | INTRAVENOUS | Status: DC | PRN
  Administered 2017-10-27: 10 mL
  Administered 2017-10-30: 20 mL
  Filled 2017-10-27 (×2): qty 40

## 2017-10-27 NOTE — Progress Notes (Signed)
Pt's HR sustaining 120-130s since arriving on floor at approximately 0300. On call Baltazar Najjar notified. Awaiting orders.

## 2017-10-27 NOTE — Progress Notes (Signed)
PHARMACY - PHYSICIAN COMMUNICATION CRITICAL VALUE ALERT - BLOOD CULTURE IDENTIFICATION (BCID)  Results for orders placed or performed during the hospital encounter of 10/26/17  Blood Culture ID Panel (Reflexed) (Collected: 10/27/2017  1:29 AM)  Result Value Ref Range   Enterococcus species NOT DETECTED NOT DETECTED   Listeria monocytogenes NOT DETECTED NOT DETECTED   Staphylococcus species NOT DETECTED NOT DETECTED   Staphylococcus aureus NOT DETECTED NOT DETECTED   Streptococcus species NOT DETECTED NOT DETECTED   Streptococcus agalactiae NOT DETECTED NOT DETECTED   Streptococcus pneumoniae NOT DETECTED NOT DETECTED   Streptococcus pyogenes NOT DETECTED NOT DETECTED   Acinetobacter baumannii NOT DETECTED NOT DETECTED   Enterobacteriaceae species DETECTED (A) NOT DETECTED   Enterobacter cloacae complex NOT DETECTED NOT DETECTED   Escherichia coli DETECTED (A) NOT DETECTED   Klebsiella oxytoca NOT DETECTED NOT DETECTED   Klebsiella pneumoniae NOT DETECTED NOT DETECTED   Proteus species NOT DETECTED NOT DETECTED   Serratia marcescens NOT DETECTED NOT DETECTED   Carbapenem resistance NOT DETECTED NOT DETECTED   Haemophilus influenzae NOT DETECTED NOT DETECTED   Neisseria meningitidis NOT DETECTED NOT DETECTED   Pseudomonas aeruginosa NOT DETECTED NOT DETECTED   Candida albicans NOT DETECTED NOT DETECTED   Candida glabrata NOT DETECTED NOT DETECTED   Candida krusei NOT DETECTED NOT DETECTED   Candida parapsilosis NOT DETECTED NOT DETECTED   Candida tropicalis NOT DETECTED NOT DETECTED    Name of physician (or Provider) Contacted: [place]  Changes to prescribed antibiotics required: Continue cefepime for now; would normally narrow to Rocephin, but NP uncomfortable narrowing overnight with multiple recent fevers despite seemingly appropriate antibiotics. Nothing in UCx yet, although suspect same pathogen will be found.  Veva Grimley A 10/27/2017  9:27 PM

## 2017-10-27 NOTE — Progress Notes (Signed)
Pharmacy Antibiotic Note  Carolyn Barton is a 62 y.o. female admitted on 10/26/2017 with UTI.  Pharmacy has been consulted for Cefepime dosing.  Plan: Cefepime 2gm iv x1, then 1gm iv q8hr  Height: 5\' 5"  (165.1 cm) Weight: 141 lb 5 oz (64.1 kg) IBW/kg (Calculated) : 57  Temp (24hrs), Avg:98.6 F (37 C), Min:97.8 F (36.6 C), Max:100.7 F (38.2 C)  Recent Labs  Lab 10/21/17 0739 10/22/17 1746 10/22/17 1759 10/23/17 0252 10/24/17 0419 10/26/17 2336 10/27/17 0142  WBC 12.4* 10.8*  --  10.8* 10.2 1.2*  --   CREATININE  --  0.89 0.80 0.87  --  0.75  --   LATICACIDVEN  --   --   --   --   --   --  1.50    Estimated Creatinine Clearance: 67.3 mL/min (by C-G formula based on SCr of 0.75 mg/dL).    No Known Allergies  Antimicrobials this admission: Cefepime 10/27/2017 >>  Dose adjustments this admission: -  Microbiology results: pending  Thank you for allowing pharmacy to be a part of this patient's care.  Carolyn Barton 10/27/2017 3:52 AM

## 2017-10-27 NOTE — Progress Notes (Signed)
[facility] Spiritual Care Note  Notified by [name]'s brother, [name] Cowan/chaplain resident, of [name]'s admission. Visited briefly with [name] and husband Fritz Pickerel, providing emotional support and prayer per request.  Consulted with bedside nurse Kimberly/RN re pt's requests for Palliative Care consult for pain mgmt and sleeping pill to facilitate rest in the midst of so much pain. Joelene Millin plans to consult with hospitalist re these requests.  Following for support, but please also page if immediate needs arise or circumstances change. Thank you.   [place], [place], [facility] M-F daytime pager [phone] [facility] 24/7 pager [phone] Voicemail [phone]

## 2017-10-27 NOTE — Progress Notes (Addendum)
Pt's automatic BP 88/60 at this time, asymptomatic. Pt requesting pain medicine, will hold at this time d/t BP. Pt verbalizes understanding and repositioned to alliviate pain. On call Baltazar Najjar notified. Awaiting orders.   On call Baltazar Najjar wrote order to give pain medicine if systolic BP greater than 90. Pt's BP @1958  is 92/52. Pain medicine to be given now. See [facility]

## 2017-10-27 NOTE — ED Notes (Signed)
Pt is complaining of excruciating pain, even with the Diluadid

## 2017-10-27 NOTE — H&P (Signed)
History and Physical    Carolyn Barton SVX:[id number] DOB: 07-08-1956 DOA: 10/26/2017  PCP: Carolyn Redwood, MD  Patient coming from: Home.  Chief Complaint: Difficulty urinating.  HPI: [name] is a 62 y.o. female with history of pulmonary embolism diagnosed a few days ago and was placed on Lovenox at that time patient also had left lower extremity edema extending up to the thigh and duplex was negative.  Patient also had recent ureteric stent placed for obstruction and had Foley catheter removed yesterday by urologist following which patient had increasing difficulty urination had come to the ER.  Patient also was complaining of increasing low back pain and left lower extremity pain where patient had edema.  ED Course: In the ER patient had Foley catheter placed following which patient had urine drained and UA showing features concerning for UTI for which patient was placed on cefepime.  Since patient was tachycardic and having significant pain patient was given multiple doses of Dilaudid despite which patient was still having uncontrollable pain.  Patient WBC count also has decreased to 1.2.  Patient admitted for further management of left lower extremity and lower abdominal pain and urinary retention.  Review of Systems: As per HPI, rest all negative.   Past Medical History:  Diagnosis Date  . Chronic back pain    lumbar    . Colostomy in place [facility])   . 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 History of chemotherapy last chemo 09-21-17  . Hypokalemia    takes potassium  . Iron deficiency anemia 04/14/2016   treated w/ Iron infusions  . Liver metastasis ([place])    secondary to colorectal cancer  . Metastatic colorectal cancer [facility]) dx 03/03/2016--- oncologist-- dr Carolyn Barton   metastatic colorectal carcinoma--  s/p  exp. lap. for bowel obstruction--  rectal mass, peritoneal carcinomatosis, liver mets---  chemotherapy  . Spinal headache 12/21/2012  .  Tachycardia    persistant since discharged from hospital 06/ 2017 due to anemia and deconditioning;  as of 09-02-2016 per pt no issues w/ heart racing in the past few weeks  . Ureteral obstruction, left   . Wears contact lenses     Past Surgical History:  Procedure Laterality Date  . BIOPSY N/A 03/03/2016   Procedure: BIOPSY OF PERITONEAL NODULE;  Surgeon: Erroll Luna, MD;  Location: [place];  Service: General;  Laterality: N/A;  . COLOSTOMY N/A 03/03/2016   Procedure: DIVERTING SIGMOID COLOSTOMY;  Surgeon: Erroll Luna, MD;  Location: [place];  Service: General;  Laterality: N/A;  . CYSTOSCOPY W/ RETROGRADES Right 10/13/2017   Procedure: CYSTOSCOPY WITH RETROGRADE PYELOGRAM/ STENT PLACEMENT;  Surgeon: Ceasar Mons, MD;  Location: [facility];  Service: Urology;  Laterality: Right;  ONLY NEEDS 30 MIN FOR BOTH PROCEDURES  . CYSTOSCOPY W/ URETERAL STENT PLACEMENT Left 10/09/2016   Procedure: CYSTOSCOPY WITH LEFT STENT REPLACEMENT 76F POLARIS STENT 24CM;  Surgeon: Carolan Clines, MD;  Location: [facility];  Service: Urology;  Laterality: Left;  . CYSTOSCOPY W/ URETERAL STENT PLACEMENT Left 03/16/2017   Procedure: CYSTOSCOPY WITH RETROGRADE PYELOGRAM WITH LEFT  STENT REMOVAL AND REPLACEMENT;  Surgeon: Carolan Clines, MD;  Location: [facility];  Service: Urology;  Laterality: Left;  . CYSTOSCOPY W/ URETERAL STENT PLACEMENT Left 10/13/2017   Procedure: CYSTOSCOPY WITH STENT REPLACEMENT;  Surgeon: Ceasar Mons, MD;  Location: [facility];  Service: Urology;  Laterality: Left;  . [place] N/A 10/13/2017  Procedure: CYSTOSCOPY WITH FULGERATION;  Surgeon: Ceasar Mons, MD;  Location: [facility];  Service: Urology;  Laterality: N/A;  . CYSTOSCOPY WITH RETROGRADE PYELOGRAM, URETEROSCOPY AND STENT PLACEMENT Left 03/18/2016   Procedure: CYSTOSCOPY WITH LEFT  RETROGRADE  PYELOGRAM,  AND  POLARIS STENT PLACEMENT;  Surgeon: Carolan Clines, MD;  Location: [facility];  Service: Urology;  Laterality: Left;  . LAPAROTOMY N/A 03/03/2016   Procedure: EXPLORATORY LAPAROTOMY;  Surgeon: Erroll Luna, MD;  Location: [place];  Service: General;  Laterality: N/A;  . LUMBAR LAMINECTOMY  11/11/2012   L4-5  and Resection synovial cyst  . MAXIMUM ACCESS (MAS)POSTERIOR LUMBAR INTERBODY FUSION (PLIF) 1 LEVEL N/A 01/11/2014   Procedure: FOR MAXIMUM ACCESS (MAS) POSTERIOR LUMBAR INTERBODY FUSION Lumbar Five Sacral One;  Surgeon: [name], MD;  Location: [place] [facility];  Service: Neurosurgery;  Laterality: N/A;  FOR MAXIMUM ACCESS (MAS) POSTERIOR LUMBAR INTERBODY FUSION Lumbar Five Sacral One  . PORTACATH PLACEMENT  04/10/2016  . REPAIR CEREBROSPINAL FLUID LEAK POST RESECTION SYNOVIAL CYST  12/21/2012  . REVISION LUMBAR HARDWARE  01/26/2014  . TRANSTHORACIC ECHOCARDIOGRAM  06/17/2016   grade 1 diastolic function , ef 26-94%/  trivial TR  . WISDOM TOOTH EXTRACTION       reports that she quit smoking about 39 years ago. Her smoking use included cigarettes. She has a 0.25 pack-year smoking history. she has never used smokeless tobacco. She reports that she does not drink alcohol or use drugs.  No Known Allergies  Family History  Problem Relation Age of Onset  . Heart disease Mother   . Melanoma Mother   . Colon cancer Neg Hx   . Colon polyps Neg Hx   . Rectal cancer Neg Hx   . Stomach cancer Neg Hx   . Esophageal cancer Neg Hx     Prior to Admission medications   Medication Sig Start Date End Date Taking? Authorizing Provider  capecitabine (XELODA) 500 MG tablet TAKE 3 TABLETS (1500 MG) IN AM AND 2 TABLETS (1000 MG) IN PM-TOTAL 2500 MG DAILY. TAKE ON DAYS 1-7 AND 15-21 OF EACH 28 DAY CYCLE. 08/24/17  Yes Ladell Pier, MD  DULoxetine (CYMBALTA) 60 MG capsule Take 1 capsule (60 mg total) by mouth every evening. 06/15/17  Yes Ladell Pier, MD  enoxaparin (LOVENOX) 60  MG/0.6ML injection Inject 0.6 mLs (60 mg total) into the skin every 12 (twelve) hours for 9 doses. 10/24/17 10/29/17 Yes Samuella Cota, MD  fluticasone (CUTIVATE) 0.05 % cream Apply topically 2 (two) times daily. 11/17/16  Yes Ladell Pier, MD  HYDROmorphone (DILAUDID) 4 MG tablet Take 4 mg by mouth every 4 (four) hours as needed for severe pain.    Yes [provider]  lidocaine-prilocaine (EMLA) cream Apply to port site one hour prior to use. Do not rub in. Cover with plastic. 03/02/17  Yes Owens Shark, NP  Meth-Hyo-M Barnett Hatter Phos-Ph Sal (URIBEL) 118 MG CAPS Take 1 capsule (118 mg total) by mouth 3 times/day as needed-between meals & bedtime. 03/16/17  Yes Carolan Clines, MD  methadone (DOLOPHINE) 10 MG tablet Take 10 mg by mouth 3 (three) times daily.    Yes [provider]  minocycline (MINOCIN) 100 MG capsule Take 1 capsule (100 mg total) by mouth 2 (two) times daily. 12/08/16  Yes Ladell Pier, MD  morphine (MSIR) 15 MG tablet Take 1-2 tablets (15-30 mg total) by mouth every 4 (four) hours as needed for severe pain. 10/21/17  Yes  Ladell Pier, MD  omeprazole (PRILOSEC) 20 MG capsule Take 1 capsule (20 mg total) by mouth daily. Patient taking differently: Take 20 mg by mouth daily as needed (reflux).  10/21/17  Yes Ladell Pier, MD  ondansetron (ZOFRAN) 8 MG tablet Take 1 tablet (8 mg total) by mouth every 8 (eight) hours as needed for nausea or vomiting. 04/24/17  Yes Owens Shark, NP  polyethylene glycol (MIRALAX / GLYCOLAX) packet Take 17 g by mouth daily as needed for mild constipation.    Yes [provider]  potassium chloride (K-DUR) 10 MEQ tablet Take 1 tablet (10 mEq total) by mouth 2 (two) times daily. 09/21/17  Yes Ladell Pier, MD  prochlorperazine (COMPAZINE) 10 MG tablet Take 1 tablet (10 mg total) by mouth every 6 (six) hours as needed for nausea or vomiting. 09/15/16  Yes Owens Shark, NP  sorbitol 70 % solution Take 30 cc twice  today, then 30 cc daily 10/19/17  Yes Owens Shark, NP  ferrous sulfate 325 (65 FE) MG EC tablet Take 1 tablet (325 mg total) by mouth daily. Patient not taking: Reported on 10/27/2017 11/17/16   Ladell Pier, MD  ketorolac (TORADOL) 10 MG tablet Take 1 tablet (10 mg total) by mouth every 8 (eight) hours. for pain Patient not taking: Reported on 10/27/2017 10/12/17   Ladell Pier, MD  phenazopyridine (PYRIDIUM) 200 MG tablet Take 1 tablet (200 mg total) by mouth 3 (three) times daily as needed for pain. Patient not taking: Reported on 10/22/2017 10/13/17 10/13/18  Ceasar Mons, MD    Physical Exam: Vitals:   10/26/17 2138 10/27/17 0007 10/27/17 0159 10/27/17 0301  BP:  (!) 142/89 (!) 167/93 137/83  Pulse:  (!) 125 (!) 134 (!) 125  Resp:  (!) 21 (!) 24 20  Temp:  97.9 F (36.6 C) 97.8 F (36.6 C) (!) 100.7 F (38.2 C)  TempSrc:  Oral Oral Oral  SpO2:  100% 98% 100%  Weight: 62.6 kg (138 lb)   64.1 kg (141 lb 5 oz)  Height: 5\' 5"  (1.651 m)   5\' 5"  (1.651 m)      Constitutional: Moderately built and nourished. Vitals:   10/26/17 2138 10/27/17 0007 10/27/17 0159 10/27/17 0301  BP:  (!) 142/89 (!) 167/93 137/83  Pulse:  (!) 125 (!) 134 (!) 125  Resp:  (!) 21 (!) 24 20  Temp:  97.9 F (36.6 C) 97.8 F (36.6 C) (!) 100.7 F (38.2 C)  TempSrc:  Oral Oral Oral  SpO2:  100% 98% 100%  Weight: 62.6 kg (138 lb)   64.1 kg (141 lb 5 oz)  Height: 5\' 5"  (1.651 m)   5\' 5"  (1.651 m)   Eyes: Anicteric no pallor. ENMT: No discharge from the ears eyes nose or mouth. Neck: No mass palpated no JVD appreciated. Respiratory: No rhonchi or crepitations. Cardiovascular: S1-S2 heard no murmurs appreciated. Abdomen: Soft nontender mildly distended no guarding or rigidity. Musculoskeletal: Left lower extremity edema extending up to the thigh.  No signs of acute ischemia. Skin: No rash. Neurologic: Alert awake oriented to time place and person.  Moves all extremities. Psychiatric:  Appears normal.  Normal affect.   Labs on Admission: I have personally reviewed following labs and imaging studies  CBC: Recent Labs  Lab 10/21/17 0739 10/22/17 1746 10/22/17 1759 10/23/17 0252 10/24/17 0419 10/26/17 2336  WBC 12.4* 10.8*  --  10.8* 10.2 1.2*  NEUTROABS 9.3* 8.8*  --   --   --  1.0*  HGB  --  14.2 16.0* 12.6 11.6* 12.2  HCT 40.3 43.1 47.0* 38.2 35.2* 36.8  MCV 90.0 88.7  --  88.8 89.3 86.8  PLT 643* 783*  --  763* 669* 253   Basic Metabolic Panel: Recent Labs  Lab 10/21/17 0739 10/22/17 1746 10/22/17 1759 10/23/17 0252 10/26/17 2336  NA 132* 135 134* 134* 134*  K 4.1 4.0 4.2 3.7 4.5  CL 98 98* 99* 100* 99*  CO2 25 25  --  26 23  GLUCOSE 111 100* 100* 119* 139*  BUN 13 20 21* 17 18  CREATININE  --  0.89 0.80 0.87 0.75  CALCIUM 9.4 9.4  --  8.6* 7.8*  MG 2.1  --   --   --   --    GFR: Estimated Creatinine Clearance: 67.3 mL/min (by C-G formula based on SCr of 0.75 mg/dL). Liver Function Tests: Recent Labs  Lab 10/21/17 0739  AST 14  ALT 11  ALKPHOS 107  BILITOT 0.5  PROT 7.0  ALBUMIN 2.9*   No results for input(s): LIPASE, AMYLASE in the last 168 hours. No results for input(s): AMMONIA in the last 168 hours. Coagulation Profile: No results for input(s): INR, PROTIME in the last 168 hours. Cardiac Enzymes: Recent Labs  Lab 10/22/17 2116  TROPONINI <0.03   BNP (last 3 results) No results for input(s): PROBNP in the last 8760 hours. HbA1C: No results for input(s): HGBA1C in the last 72 hours. CBG: No results for input(s): GLUCAP in the last 168 hours. Lipid Profile: No results for input(s): CHOL, HDL, LDLCALC, TRIG, CHOLHDL, LDLDIRECT in the last 72 hours. Thyroid Function Tests: No results for input(s): TSH, T4TOTAL, FREET4, T3FREE, THYROIDAB in the last 72 hours. Anemia Panel: No results for input(s): VITAMINB12, FOLATE, FERRITIN, TIBC, IRON, RETICCTPCT in the last 72 hours. Urine analysis:    Component Value Date/Time    COLORURINE AMBER (A) 10/26/2017 2310   APPEARANCEUR CLOUDY (A) 10/26/2017 2310   LABSPEC 1.010 10/26/2017 2310   LABSPEC 1.015 11/17/2016 1547   PHURINE 6.0 10/26/2017 2310   GLUCOSEU NEGATIVE 10/26/2017 2310   GLUCOSEU Negative 11/17/2016 1547   HGBUR LARGE (A) 10/26/2017 2310   BILIRUBINUR NEGATIVE 10/26/2017 2310   BILIRUBINUR Negative 11/17/2016 1547   KETONESUR NEGATIVE 10/26/2017 2310   PROTEINUR 100 (A) 10/26/2017 2310   UROBILINOGEN 0.2 11/17/2016 1547   NITRITE NEGATIVE 10/26/2017 2310   LEUKOCYTESUR LARGE (A) 10/26/2017 2310   LEUKOCYTESUR Small 11/17/2016 1547   Sepsis Labs: @LABRCNTIP (procalcitonin:4,lacticidven:4) )No results found for this or any previous visit (from the past 240 hour(s)).   Radiological Exams on Admission: Dg Abdomen 1 View  Result Date: 10/27/2017 CLINICAL DATA:  Ureteral stents. Minimal urine output. Abdominal pain. EXAM: ABDOMEN - 1 VIEW COMPARISON:  Fluoroscopy 10/13/2017. CT abdomen and pelvis 10/11/2016 FINDINGS: Bilateral ureteral stents are in place without change in position since the previous fluoroscopic images. No radiopaque stones are identified. Left lower quadrant colostomy. Scattered gas and stool throughout the colon. No small or large bowel distention. Degenerative changes and postoperative changes in the lumbar spine. Lumbar scoliosis convex towards the left. Degenerative changes in the hips. IMPRESSION: Bilateral ureteral stents appear unchanged in position since previous study. Stool-filled colon. No evidence of bowel obstruction. Electronically Signed   By: Lucienne Capers M.D.   On: 10/27/2017 02:37      Assessment/Plan Principal Problem:   Lower urinary tract infectious disease Active Problems:   Rectal cancer ([place])   Bilateral pulmonary embolism ([place])  Tachycardia   Urinary retention   Chemotherapy-induced neutropenia (HCC)   UTI (urinary tract infection)    1. Left lower extremity and lower abdominal pain with recent  diagnosis of PE with significant swelling of the left lower extremity -I have discussed with on-call vascular surgeon Dr. Scot Dock.  Since recent Dopplers did not show any DVT of the left lower extremity Dr. Doren Custard advised to get CT abdomen and pelvis venogram to check for abdominal DVT.  Patient is on full dose of Lovenox and also on pain relief medications. 2. Acute urinary retention with UTI -since patient is febrile and has leukopenia patient placed on cefepime.  Follow cultures.  Follow CBC. 3. History of hypertension presently not on medications -we will keep patient on PRN hydralazine with close follow-up of blood pressure trends. 4. Recently diagnosed with pulmonary embolism on Lovenox. 5. Colon cancer being followed by Dr. Learta Codding.   DVT prophylaxis: Lovenox. Code Status: Full code. Family Communication: Discussed with patient. Disposition Plan: To be determined. Consults called: Discussed with vascular surgeon. Admission status: Observation.   Rise Patience MD [facility] Pager [phone].  If 7PM-7AM, please contact night-coverage [email or web address] Password [facility]  10/27/2017, 3:40 AM

## 2017-10-27 NOTE — Progress Notes (Addendum)
PROGRESS NOTE  Carolyn Barton AOZ:[id number] DOB: [birth year] DOA: 10/26/2017 PCP: Marton Redwood, MD  HPI/Recap of past 24 hours:  Carolyn Barton is a 62 y.o. 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   This morning abdominal pain and right buttocks pain.  Husband at bedside.  Assessment/Plan: Principal Problem:   Lower urinary tract infectious disease Active Problems:   Rectal cancer ([place])   Bilateral pulmonary embolism (HCC)   Tachycardia   Urinary retention   Chemotherapy-induced neutropenia (HCC)   UTI (urinary tract infection)    Sepsis secondary to pyelonephritis.Patient febrile to 100.6, leukocytosis of 13, suspect most likely etiology related to urinary tract given known pelvic mass and acute urinary retention in setting of previously indwelling Foley catheter (removed as an outpatient on 1/28).  CT venogram to evaluate lower extremity swelling shows increase edema of the right kidney potential concern for pyelonephritis.  Patient has previous ureteral stents which on CT visualization appear to be draining appropriately -Follow-up blood cultures, urine cultures (Foley catheter replaced) -Continue empiric cefepime -Monitor fever, acetaminophen as needed, monitor CBC -Low threshold to consult urology if concerned ureteral stents are not functioning  Abdominal pain, presumed multifactorial etiology, known pelvic mass/Right ovarian mass related to malignancy, increased right kidney edema (pyelonephritis?)  Post (KUB negative for obstruction.  Stool-filled colon. B/l ureteral stents in place.  CT venogram does show small volume ascites, increased mesenteric edema -Discussed case with patient's oncologist, increase home methadone to 12.5mg  3 times daily, palliative care consulted for pain management  Acute urinary retention likely due to progressive pelvic mass in setting of known malignancy, improving Close to 1 L out after placement of Foley catheter in ED.  Patient does have history of bilateral ureteral stents -Continue to monitor urinary output with new foley placed  Sinus Tachycardia, likely multifactorial etiology (dehydration, sepsis) per discharge summary and previous progress notes from recent hospitalization tachycardia is chronic in nature.  However heart rate elevated 130s-140s currently.  EKG so far showed sinus tachycardia. -Continue monitor on telemetry -Expect some improvement with treatment of infection as mentioned above -Timed IV fluids  Recent bilateral acute PE.  Discharged on 1/26 on Lovenox with plan to transition to Xarelto as an outpatient on 1/30.  No worsening dyspnea currently.  Left femoral vein DVT, new.  CT venogram to evaluate worsening lower extremity swelling on this admission demonstrated clot.  Continue therapeutic Lovenox  Chronic Pain.  As mentioned above optimizing pain regimen. -Appreciate recommendations from palliative care for pain/symptom management  Code Status: Full COde  Family Communication: Husband updated at bedside  Disposition Plan: IV antibiotics, follow-up blood and urine cultures, pain management   Consultants:  Palliative care  Procedures:  CT venogram 10/27/17 :  IMPRESSION: The CT venogram study is positive for nonocclusive DVT of the left common femoral vein. There is compression or potentially occlusion of the left iliac venous system secondary to mass effect from the pelvic mass. Associated collateral venous drainage in the body wall, with proximal left leg  edema.  Since the prior CT, there is now differential perfusion/edema of the bilateral kidneys, new on the right and similar to the left.  The right kidney is affected at the inferior pole and the left kidney at the superior pole. The edema is favored to be secondary to at least partial obstruction of the right inferior pole moiety and left superior pole moiety. On the left, this would imply a blocked ureteral stent. Differential includes focal infection/pyelonephritis, or less likely focal infarction from either arterial or venous source.  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 The CT is nondiagnostic to identify whether there is complete duplication on either/both the left or the right.  Single bilateral double-J ureteral stents. Both stents appear to drain the upper pole moiety.  Increasing body wall and mesenteric edema. Recommend correlation with fluid status.  Developing small volume ascites.  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 Diameter measures 17 cm on axial images on today's study, increased from prior, which may be either actual growth or differences in technique/measurement. Again, pelvic mass contributes to compression of the left iliac vasculature and the bilateral ureters.  Surgical changes of colon ostomy.   Antimicrobials:  Cefepime 1/28>>  Cultures:  Blood culture 1/29: NGTD  Urine culture 1/28: pending  DVT prophylaxis: Treatment dose lovenox   Objective: Vitals:   10/27/17 0007 10/27/17 0159 10/27/17 0301 10/27/17 0554  BP: (!) 142/89 (!) 167/93 137/83 118/73  Pulse: (!) 125 (!) 134 (!) 125 (!) 126  Resp: (!) 21 (!) 24 20 16   Temp: 97.9 F (36.6 C) 97.8 F (36.6 C) (!) 100.7 F (38.2 C) 98.2 F (36.8 C)  TempSrc: Oral Oral Oral Oral  SpO2: 100% 98% 100% 100%  Weight:   64.1 kg (141 lb 5 oz)   Height:   5\' 5"  (1.651 m)      Intake/Output Summary (Last 24 hours) at 10/27/2017 0737 Last data filed at 10/27/2017 7517 Gross per 24 hour  Intake 130 ml  Output 1800 ml  Net -1670 ml   Filed Weights   10/26/17 2138 10/27/17 0301  Weight: 62.6 kg (138 lb) 64.1 kg (141 lb 5 oz)    Exam:  General: Frail woman who looks older than stated age, in obvious discomfort, lying in bed HEENT: Dry oral mucosa Abdomen: Ostomy bag in place with no current drainage, abdomen soft, no guarding or rebound tenderness, slightly tender to palpation, decreased bowel sounds MSK: No appreciable CVA tenderness, left buttocks tender to palpation Extremities: 2+ pitting edema bilaterally to below knees Cardiovascular: Tachycardic, regular rhythm, no appreciable murmurs  Data Reviewed: CBC: Recent Labs  Lab 10/21/17 0739  10/22/17 1746 10/22/17 1759 10/23/17 0252 10/24/17 0419 10/26/17 2336 10/27/17 0644  WBC 12.4*  --  10.8*  --  10.8* 10.2 1.2* 13.8*  NEUTROABS 9.3*  --  8.8*  --   --   --  1.0*  --   HGB  --    < > 14.2 16.0* 12.6 11.6* 12.2 10.7*  HCT 40.3  --  43.1 47.0* 38.2 35.2* 36.8 31.5*  MCV 90.0  --  88.7  --  88.8 89.3 86.8 87.5  PLT 643*  --  783*  --  763* 669* 286 197   < > = values in this interval not displayed.   Basic Metabolic Panel: Recent Labs  Lab 10/21/17 0739 10/22/17 1746 10/22/17 1759 10/23/17 0252 10/26/17 2336 10/27/17 0644  NA 132* 135 134* 134* 134* 132*  K 4.1 4.0 4.2 3.7 4.5 3.1*  CL 98 98* 99* 100* 99* 101  CO2 25 25  --  26 23 23   GLUCOSE 111 100* 100* 119* 139* 114*  BUN 13 20 21* 17 18 15   CREATININE  --  0.89 0.80 0.87 0.75 0.83  CALCIUM 9.4 9.4  --  8.6* 7.8* 7.3*  MG 2.1  --   --   --   --   --    GFR: Estimated Creatinine Clearance: 64.9 mL/min (by C-G formula based on SCr of 0.83 mg/dL). Liver Function Tests: Recent Labs  Lab 10/21/17 0739  AST 14  ALT 11  ALKPHOS 107  BILITOT 0.5  PROT 7.0  ALBUMIN 2.9*   No results for input(s): LIPASE, AMYLASE in the  last 168 hours. No results for input(s): AMMONIA in the last 168 hours. Coagulation Profile: No results for input(s): INR, PROTIME in the last 168 hours. Cardiac Enzymes: Recent Labs  Lab 10/22/17 2116  TROPONINI <0.03   BNP (last 3 results) No results for input(s): PROBNP in the last 8760 hours. HbA1C: No results for input(s): HGBA1C in the last 72 hours. CBG: No results for input(s): GLUCAP in the last 168 hours. Lipid Profile: No results for input(s): CHOL, HDL, LDLCALC, TRIG, CHOLHDL, LDLDIRECT in the last 72 hours. Thyroid Function Tests: No results for input(s): TSH, T4TOTAL, FREET4, T3FREE, THYROIDAB in the last 72 hours. Anemia Panel: No results for input(s): VITAMINB12, FOLATE, FERRITIN, TIBC, IRON, RETICCTPCT in the last 72 hours. Urine analysis:    Component Value Date/Time   COLORURINE AMBER (A) 10/26/2017 2310   APPEARANCEUR CLOUDY (A) 10/26/2017 2310   LABSPEC 1.010 10/26/2017 2310   LABSPEC 1.015 11/17/2016 1547   PHURINE 6.0 10/26/2017 2310   GLUCOSEU NEGATIVE 10/26/2017 2310   GLUCOSEU Negative 11/17/2016 1547   HGBUR LARGE (A) 10/26/2017 2310   BILIRUBINUR NEGATIVE 10/26/2017 2310   BILIRUBINUR Negative 11/17/2016 1547   KETONESUR NEGATIVE 10/26/2017 2310   PROTEINUR 100 (A) 10/26/2017 2310   UROBILINOGEN 0.2 11/17/2016 1547   NITRITE NEGATIVE 10/26/2017 2310   LEUKOCYTESUR LARGE (A) 10/26/2017 2310   LEUKOCYTESUR Small 11/17/2016 1547   Sepsis Labs: @LABRCNTIP (procalcitonin:4,lacticidven:4)  )No results found for this or any previous visit (from the past 240 hour(s)).    Studies: Dg Abdomen 1 View  Result Date: 10/27/2017 CLINICAL DATA:  Ureteral stents. Minimal urine output. Abdominal pain. EXAM: ABDOMEN - 1 VIEW COMPARISON:  Fluoroscopy 10/13/2017. CT abdomen and pelvis 10/11/2016 FINDINGS: Bilateral ureteral stents are in place without change in position since the previous fluoroscopic images. No radiopaque stones are identified. Left lower  quadrant colostomy. Scattered gas and stool throughout the colon. No small or large bowel distention. Degenerative changes and postoperative changes in the lumbar spine. Lumbar scoliosis convex towards the left. Degenerative changes in the hips. IMPRESSION: Bilateral ureteral stents appear unchanged in position since previous study. Stool-filled colon. No evidence of bowel obstruction. Electronically Signed   By: Lucienne Capers M.D.   On: 10/27/2017 02:37    Scheduled Meds: . DULoxetine  60 mg Oral QPM  . enoxaparin (LOVENOX) injection  60 mg Subcutaneous Q12H  . methadone  10 mg Oral TID  . potassium chloride  10 mEq Oral BID    Continuous Infusions: . sodium chloride 100 mL/hr at 10/27/17 0343  . ceFEPime (MAXIPIME) IV       LOS: 0 days     Desiree Hane, MD [facility] Pager [phone]  If 7PM-7AM, please contact night-coverage [email or web address] Password [facility] 10/27/2017, 7:37 AM

## 2017-10-27 NOTE — Progress Notes (Signed)
ANTICOAGULATION CONSULT NOTE - Initial Consult  Pharmacy Consult for enoxaparin Indication: pulmonary embolus  No Known Allergies  Patient Measurements: Height: 5\' 5"  (165.1 cm) Weight: 141 lb 5 oz (64.1 kg) IBW/kg (Calculated) : 57 Heparin Dosing Weight:   Vital Signs: Temp: 100.7 F (38.2 C) (01/29 0301) Temp Source: Oral (01/29 0301) BP: 137/83 (01/29 0301) Pulse Rate: 125 (01/29 0301)  Labs: Recent Labs    10/24/17 0419 10/26/17 2336  HGB 11.6* 12.2  HCT 35.2* 36.8  PLT 669* 286  HEPARINUNFRC 0.39  --   CREATININE  --  0.75    Estimated Creatinine Clearance: 67.3 mL/min (by C-G formula based on SCr of 0.75 mg/dL).   Medical History: Past Medical History:  Diagnosis Date  . Chronic back pain    lumbar    . Colostomy in place [facility])   . 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 History of chemotherapy last chemo 09-21-17  . Hypokalemia    takes potassium  . Iron deficiency anemia 04/14/2016   treated w/ Iron infusions  . 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 Metastatic colorectal cancer [facility]) dx 03/03/2016--- oncologist-- dr Benay Spice   metastatic colorectal carcinoma--  s/p  exp. lap. for bowel obstruction--  rectal mass, peritoneal carcinomatosis, liver mets---  chemotherapy  . Spinal headache 12/21/2012  . Tachycardia    persistant since discharged from hospital 06/ 2017 due to anemia and deconditioning;  as of 09-02-2016 per pt no issues w/ heart racing in the past few weeks  . Ureteral obstruction, left   . Wears contact lenses     Medications:  Medications Prior to Admission  Medication Sig Dispense Refill Last Dose  . capecitabine (XELODA) 500 MG tablet TAKE 3 TABLETS (1500 MG) IN AM AND 2 TABLETS (1000 MG) IN PM-TOTAL 2500 MG DAILY. TAKE ON DAYS 1-7 AND 15-21 OF EACH 28 DAY CYCLE. 70 tablet 5 10/26/2017 at Unknown time  . DULoxetine (CYMBALTA) 60 MG capsule Take 1 capsule (60 mg total) by mouth every evening.  30 capsule 3 10/26/2017 at Unknown time  . enoxaparin (LOVENOX) 60 MG/0.6ML injection Inject 0.6 mLs (60 mg total) into the skin every 12 (twelve) hours for 9 doses.   10/26/2017 at Unknown time  . fluticasone (CUTIVATE) 0.05 % cream Apply topically 2 (two) times daily. 30 g 1 10/26/2017 at Unknown time  . HYDROmorphone (DILAUDID) 4 MG tablet Take 4 mg by mouth every 4 (four) hours as needed for severe pain.    10/26/2017 at Unknown time  . lidocaine-prilocaine (EMLA) cream Apply to port site one hour prior to use. Do not rub in. Cover with plastic. 30 g 2 Past Month at Unknown time  . Meth-Hyo-M Bl-Na Phos-Ph Sal (URIBEL) 118 MG CAPS Take 1 capsule (118 mg total) by mouth 3 times/day as needed-between meals & bedtime. 120 capsule 3 Past Month at Unknown time  . methadone (DOLOPHINE) 10 MG tablet Take 10 mg by mouth 3 (three) times daily.    10/26/2017 at Unknown time  . minocycline (MINOCIN) 100 MG capsule Take 1 capsule (100 mg total) by mouth 2 (two) times daily. 60 capsule 11 10/26/2017 at Unknown time  . morphine (MSIR) 15 MG tablet Take 1-2 tablets (15-30 mg total) by mouth every 4 (four) hours as needed for severe pain. 120 tablet 0 10/26/2017 at Unknown time  . omeprazole (PRILOSEC) 20 MG capsule Take 1 capsule (20 mg total) by mouth daily. (Patient taking  differently: Take 20 mg by mouth daily as needed (reflux). ) 30 capsule 5 Past Week at Unknown time  . ondansetron (ZOFRAN) 8 MG tablet Take 1 tablet (8 mg total) by mouth every 8 (eight) hours as needed for nausea or vomiting. 20 tablet 1 10/26/2017 at Unknown time  . polyethylene glycol (MIRALAX / GLYCOLAX) packet Take 17 g by mouth daily as needed for mild constipation.    Past Week at Unknown time  . potassium chloride (K-DUR) 10 MEQ tablet Take 1 tablet (10 mEq total) by mouth 2 (two) times daily. 60 tablet 1 10/26/2017 at Unknown time  . prochlorperazine (COMPAZINE) 10 MG tablet Take 1 tablet (10 mg total) by mouth every 6 (six) hours as needed  for nausea or vomiting. 30 tablet 1 Past Week at Unknown time  . sorbitol 70 % solution Take 30 cc twice today, then 30 cc daily 473 mL 0 10/26/2017 at Unknown time  . ferrous sulfate 325 (65 FE) MG EC tablet Take 1 tablet (325 mg total) by mouth daily. (Patient not taking: Reported on 10/27/2017)  0 Not Taking at Unknown time  . ketorolac (TORADOL) 10 MG tablet Take 1 tablet (10 mg total) by mouth every 8 (eight) hours. for pain (Patient not taking: Reported on 10/27/2017) 6 tablet 0 Not Taking at Unknown time  . phenazopyridine (PYRIDIUM) 200 MG tablet Take 1 tablet (200 mg total) by mouth 3 (three) times daily as needed for pain. (Patient not taking: Reported on 10/22/2017) 30 tablet 0 Completed Course at Unknown time   Scheduled:  . DULoxetine  60 mg Oral QPM  . enoxaparin (LOVENOX) injection  60 mg Subcutaneous Q12H  . methadone  10 mg Oral TID  . potassium chloride  10 mEq Oral BID    Assessment: Patient on enoxaparin 60mg  sq 12hr prior to admission.  Last dose estimated to be 2000 1/28.  Goal of Therapy:  Anti-Xa level 0.6-1 units/ml 4hrs after LMWH dose given Monitor platelets by anticoagulation protocol: Yes   Plan:  Enoxaparin 60mg  sq q12hr  [name] 10/27/2017,4:03 AM

## 2017-10-27 NOTE — ED Notes (Signed)
Date and time results received: 10/27/17 12:30 AM  (use smartphrase ".now" to insert current time)  Test: WBC Critical Value: 1.2  Name of Provider Notified: [place]  Orders Received? Or Actions Taken?:

## 2017-10-28 ENCOUNTER — Other Ambulatory Visit: Payer: Self-pay | Admitting: Radiology

## 2017-10-28 ENCOUNTER — Telehealth: Payer: Self-pay | Admitting: Oncology

## 2017-10-28 ENCOUNTER — Ambulatory Visit: Payer: BLUE CROSS/BLUE SHIELD | Admitting: Oncology

## 2017-10-28 DIAGNOSIS — C786 Secondary malignant neoplasm of retroperitoneum and peritoneum: Secondary | ICD-10-CM

## 2017-10-28 DIAGNOSIS — Z515 Encounter for palliative care: Secondary | ICD-10-CM

## 2017-10-28 DIAGNOSIS — C785 Secondary malignant neoplasm of large intestine and rectum: Secondary | ICD-10-CM

## 2017-10-28 DIAGNOSIS — N10 Acute tubulo-interstitial nephritis: Secondary | ICD-10-CM

## 2017-10-28 DIAGNOSIS — C787 Secondary malignant neoplasm of liver and intrahepatic bile duct: Secondary | ICD-10-CM

## 2017-10-28 DIAGNOSIS — E440 Moderate protein-calorie malnutrition: Secondary | ICD-10-CM

## 2017-10-28 DIAGNOSIS — A4151 Sepsis due to Escherichia coli [E. coli]: Principal | ICD-10-CM

## 2017-10-28 DIAGNOSIS — C20 Malignant neoplasm of rectum: Secondary | ICD-10-CM

## 2017-10-28 DIAGNOSIS — I82412 Acute embolism and thrombosis of left femoral vein: Secondary | ICD-10-CM

## 2017-10-28 DIAGNOSIS — G893 Neoplasm related pain (acute) (chronic): Secondary | ICD-10-CM

## 2017-10-28 DIAGNOSIS — N390 Urinary tract infection, site not specified: Secondary | ICD-10-CM

## 2017-10-28 DIAGNOSIS — I2699 Other pulmonary embolism without acute cor pulmonale: Secondary | ICD-10-CM

## 2017-10-28 MED ORDER — SODIUM CHLORIDE 0.9 % IV SOLN
INTRAVENOUS | Status: DC
  Administered 2017-10-28 – 2017-10-31 (×5): via INTRAVENOUS

## 2017-10-28 MED ORDER — MORPHINE SULFATE 15 MG PO TABS
15.0000 mg | ORAL_TABLET | ORAL | Status: DC | PRN
Start: 2017-10-28 — End: 2017-11-02
  Administered 2017-10-29: 30 mg via ORAL
  Administered 2017-10-29 (×3): 15 mg via ORAL
  Administered 2017-10-30 – 2017-11-02 (×6): 30 mg via ORAL
  Filled 2017-10-28 (×2): qty 1
  Filled 2017-10-28 (×4): qty 2
  Filled 2017-10-28: qty 1
  Filled 2017-10-28 (×3): qty 2

## 2017-10-28 MED ORDER — DEXTROSE 5 % IV SOLN
2.0000 g | INTRAVENOUS | Status: DC
  Administered 2017-10-28 – 2017-10-30 (×3): 2 g via INTRAVENOUS
  Filled 2017-10-28 (×3): qty 2

## 2017-10-28 MED ORDER — ADULT MULTIVITAMIN W/MINERALS CH
1.0000 | ORAL_TABLET | Freq: Every day | ORAL | Status: DC
  Administered 2017-10-28 – 2017-10-29 (×2): 1 via ORAL
  Filled 2017-10-28 (×5): qty 1

## 2017-10-28 MED ORDER — DEXTROSE 5 % IV SOLN
1.0000 g | Freq: Three times a day (TID) | INTRAVENOUS | Status: DC
  Administered 2017-10-28: 1 g via INTRAVENOUS
  Filled 2017-10-28: qty 1

## 2017-10-28 MED ORDER — METHADONE HCL 5 MG PO TABS
10.0000 mg | ORAL_TABLET | Freq: Three times a day (TID) | ORAL | Status: DC
Start: 2017-10-29 — End: 2017-10-29

## 2017-10-28 NOTE — Telephone Encounter (Signed)
Moved 2/4 treatment to 2/6 due to authorization start date (see referral messages). Lab/flush/fu will remain 2/4. Spoke with patient she is aware. GBS also aware.

## 2017-10-28 NOTE — Progress Notes (Signed)
Initial Nutrition Assessment  DOCUMENTATION CODES:   Not applicable  INTERVENTION:  RD to order: Magic cup TID with meals, each supplement provides 290kcals and 9g of protein. MVI    NUTRITION DIAGNOSIS:   Inadequate oral intake related to poor appetite as evidenced by per patient/family report, meal completion < 25%.   GOAL:   Patient will meet greater than or equal to 90% of their needs   MONITOR:   PO intake, Supplement acceptance  REASON FOR ASSESSMENT:   Malnutrition Screening Tool    ASSESSMENT:   62 y.o. 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 New left femoral DVT.  Pt reports a very poor appetite without N/V this morning. Pt attempted to eat grits for breakfast; Dietetic intern observed that only a few bites of grits were taken of the whole breakfast tray. Pt reports she only ate a couple bites of chicken salad last night for dinner but was able to tolerate a cup of ice cream provided by the RN. Pt reports she will order fruit with lunch today.   Pt reports she is drinking well. Dietetic intern observed several cups of water and cans of diet coke.   Pt reports she could not tolerate the potassium pill given to her. Pt states she vomited shortly after it was given.  Pt is actively receiving chemo treatment. Last treatment on 1/23. Per pt, next treatment on 2/6. Pt denies any taste or smell changes.   Pt reports she cannot tolerate nutrition supplements like Ensure/Boost and was only interested in trying [employer].  Pt reports  PTA she is mindful of her protein and calorie intake from her visits with outpatient nutrition. She reports she eats 3 meals a day with two snacks. She reports usually incorporating protein (meat, cheese, nuts) in every meal and snack with the exception of breakfast.    Pt reports her UBW before cancer diagnosis was 140lb and her UBW after diagnosis is 135. Pt is 104% of post-diagnosis UBW.  Pt also reports a 5lb loss over the past 2-3 weeks due to the swelling in her leg. This could not be confirmed in the medical record.   Pt's weight has been trending upward since 08/10/17 (126lb 6.4oz). Fluctuations in the pt's weight since 1/26 may be due to fluid gains/losses.  Medications: KCl tablet, IV Maxipime,    Labs: Na (L), K (L)  NUTRITION - FOCUSED PHYSICAL EXAM:    Most Recent Value  Orbital Region  Mild depletion  Upper Arm Region  No depletion  Thoracic and Lumbar Region  Mild depletion  Buccal Region  Mild depletion  Temple Region  Moderate depletion  Clavicle Bone Region  Unable to assess  Clavicle and Acromion Bone Region  Moderate depletion  Scapular Bone Region  Moderate depletion  Dorsal Hand  Mild depletion  Patellar Region  Moderate depletion  Anterior Thigh Region  Moderate depletion  Posterior Calf Region  Moderate depletion  Edema (RD Assessment)  Mild [Right leg only (L leg swollen/unable to assess)]       Diet Order:  Diet Heart Room service appropriate? Yes; Fluid consistency: Thin  EDUCATION NEEDS:   No education  needs have been identified at this time  Skin:  Skin Assessment: Reviewed RN Assessment  Last BM:  1/29  Height:   Ht Readings from Last 1 Encounters:  10/27/17 5\' 5"  (1.651 m)    Weight:  Wt Readings from Last 3 Encounters:  10/27/17 141 lb 5 oz (64.1 kg)  10/24/17 147 lb 4.3 oz (66.8 kg)  10/21/17 138 lb 8 oz (62.8 kg)   1/28 138lb (62.6kg)   Ideal Body Weight:  56.8 kg  BMI:  Body mass index is 23.52 kg/m.  Estimated  Nutritional Needs:   Kcal:  1700-1900  Protein:  80-90  Fluid:  1.7-1.9L   Griffith Santilli, MS, Dietetic Intern Pager # [phone]

## 2017-10-28 NOTE — Progress Notes (Signed)
PROGRESS NOTE    Carolyn Barton  HAL:[id number] DOB: 1956/06/15 DOA: 10/26/2017 PCP: Marton Redwood, MD      Brief Narrative:  Carolyn Barton is a 62 y.o. 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   Assessment & Plan:  Principal Problem:   Lower urinary tract infectious disease Active Problems:   Malnutrition of moderate degree   Rectal cancer (HCC)   Metastatic colorectal cancer (HCC)   Bilateral pulmonary embolism (HCC)   Tachycardia   Urinary retention   Chemotherapy-induced neutropenia (HCC)   UTI (urinary tract infection)   Left femoral vein DVT (HCC)   Sepsis (HCC)   Acute pyelonephritis   Sepsis from E. coli bacteremia, pyelonephritis Case was discussed with urology, they felt given stents in place on CT venogram, no intervention needed at this time.   -Stop cefepime, start ceftriaxone -Start IV fluids   Chronic abdominal pain -Continue methadone -Transition today from hydromorphone to MSIR -Continue duloxetine  Sinus tachycardia Chronic  Bilateral pulmonary embolism Femoral DVT -Continue Lovenox     DVT prophylaxis: Not applicable on Lovenox Code Status: Full code Family Communication: None present Disposition Plan: IV antibiotics and follow culture data.        Subjective: Patient feeling better this morning.  Pain better.  No new fever.  Some hypotension overnight, improved with fluids.  Objective: Vitals:   10/27/17 2242 10/28/17 0056 10/28/17 0445 10/28/17 1234  BP: (!) 92/52 96/60 105/67 112/60    Pulse:   (!) 114 (!) 118  Resp:   20   Temp:   98.5 F (36.9 C) 97.8 F (36.6 C)  TempSrc:   Oral Oral  SpO2:   100% 99%  Weight:      Height:        Intake/Output Summary (Last 24 hours) at 10/28/2017 1239 Last data filed at 10/28/2017 1109 Gross per 24 hour  Intake 2879.58 ml  Output 1625 ml  Net 1254.58 ml   Filed Weights   10/26/17 2138 10/27/17 0301  Weight: 62.6 kg (138 lb) 64.1 kg (141 lb 5 oz)    Examination: General appearance:  adult female, alert and in no acute distress.  Appears tired. HEENT: Anicteric, conjunctiva pink, lids and lashes normal. No nasal deformity, discharge, epistaxis.      Skin: Warm and dry.  No suspicious rashes or lesions. Cardiac: tachycardic, nl S1-S2, no murmurs appreciated.    Respiratory: Normal respiratory rate and rhythm.  CTAB without rales or wheezes. Abdomen: Abdomen soft.  Mild nonfocal TTP with voluntary guarding.   MSK: No deformities or effusions. Neuro: Awake and alert.  EOMI, moves all extremities. Speech fluent.    Psych: Sensorium intact and responding to questions, attention normal. Affect .  Judgment and insight appear normal.    Data Reviewed: I have personally reviewed following labs and imaging studies:  CBC: Recent Labs  Lab 10/22/17 1746 10/22/17 1759 10/23/17 0252 10/24/17 0419 10/26/17 2336 10/27/17 0644  WBC 10.8*  --  10.8* 10.2 1.2* 13.8*  NEUTROABS 8.8*  --   --   --  1.0*  --   HGB 14.2 16.0* 12.6 11.6* 12.2 10.7*  HCT 43.1  47.0* 38.2 35.2* 36.8 31.5*  MCV 88.7  --  88.8 89.3 86.8 87.5  PLT 783*  --  763* 669* 286 616   Basic Metabolic Panel: Recent Labs  Lab 10/22/17 1746 10/22/17 1759 10/23/17 0252 10/26/17 2336 10/27/17 0644  NA 135 134* 134* 134* 132*  K 4.0 4.2 3.7 4.5 3.1*  CL 98* 99* 100* 99* 101  CO2 25  --  26 23 23   GLUCOSE 100* 100* 119* 139* 114*  BUN 20 21* 17 18 15   CREATININE 0.89 0.80 0.87 0.75 0.83  CALCIUM 9.4  --  8.6* 7.8* 7.3*   GFR: Estimated Creatinine  Clearance: 64.9 mL/min (by C-G formula based on SCr of 0.83 mg/dL). Liver Function Tests: No results for input(s): AST, ALT, ALKPHOS, BILITOT, PROT, ALBUMIN in the last 168 hours. No results for input(s): LIPASE, AMYLASE in the last 168 hours. No results for input(s): AMMONIA in the last 168 hours. Coagulation Profile: No results for input(s): INR, PROTIME in the last 168 hours. Cardiac Enzymes: Recent Labs  Lab 10/22/17 2116  TROPONINI <0.03   BNP (last 3 results) No results for input(s): PROBNP in the last 8760 hours. HbA1C: No results for input(s): HGBA1C in the last 72 hours. CBG: No results for input(s): GLUCAP in the last 168 hours. Lipid Profile: No results for input(s): CHOL, HDL, LDLCALC, TRIG, CHOLHDL, LDLDIRECT in the last 72 hours. Thyroid Function Tests: No results for input(s): TSH, T4TOTAL, FREET4, T3FREE, THYROIDAB in the last 72 hours. Anemia Panel: No results for input(s): VITAMINB12, FOLATE, FERRITIN, TIBC, IRON, RETICCTPCT in the last 72 hours. Urine analysis:    Component Value Date/Time   COLORURINE AMBER (A) 10/26/2017 2310   APPEARANCEUR CLOUDY (A) 10/26/2017 2310   LABSPEC 1.010 10/26/2017 2310   LABSPEC 1.015 11/17/2016 1547   PHURINE 6.0 10/26/2017 2310   GLUCOSEU NEGATIVE 10/26/2017 2310   GLUCOSEU Negative 11/17/2016 1547   HGBUR LARGE (A) 10/26/2017 2310   BILIRUBINUR NEGATIVE 10/26/2017 2310   BILIRUBINUR Negative 11/17/2016 1547   KETONESUR NEGATIVE 10/26/2017 2310   PROTEINUR 100 (A) 10/26/2017 2310   UROBILINOGEN 0.2 11/17/2016 1547   NITRITE NEGATIVE 10/26/2017 2310   LEUKOCYTESUR LARGE (A) 10/26/2017 2310   LEUKOCYTESUR Small 11/17/2016 1547   Sepsis Labs: @LABRCNTIP (procalcitonin:4,lacticacidven:4)  ) Recent Results (from the past 240 hour(s))  Urine C&S     Status: Abnormal (Preliminary result)   Collection Time: 10/26/17 11:11 PM  Result Value Ref Range Status   Specimen Description URINE, CATHETERIZED  Final   Special  Requests Immunocompromised  Final   Culture >=100,000 COLONIES/mL ESCHERICHIA COLI (A)  Final   Report Status PENDING  Incomplete  Blood Culture (routine x 2)     Status: None (Preliminary result)   Collection Time: 10/27/17  1:29 AM  Result Value Ref Range Status   Specimen Description BLOOD RIGHT HAND  Final   Special Requests IN PEDIATRIC BOTTLE Blood Culture adequate volume  Final   Culture  Setup Time   Final    GRAM NEGATIVE RODS IN PEDIATRIC BOTTLE CRITICAL VALUE NOTED.  VALUE IS CONSISTENT WITH PREVIOUSLY REPORTED AND CALLED VALUE. Performed at [facility], [place] [address] [place], [place] [address]    Culture GRAM NEGATIVE RODS  Final   Report Status PENDING  Incomplete  Blood Culture (routine x 2)     Status: Abnormal (Preliminary result)   Collection Time: 10/27/17  1:29 AM  Result Value Ref Range Status   Specimen Description BLOOD LEFT HAND  Final   Special Requests IN PEDIATRIC BOTTLE Blood Culture adequate volume  Final   Culture  Setup Time   Final    GRAM NEGATIVE RODS IN PEDIATRIC BOTTLE CRITICAL RESULT CALLED TO, READ BACK BY AND VERIFIED WITH: Dianna Rossetti LEGGE [id number] 2032 MLM    Culture (A)  Final    ESCHERICHIA COLI SUSCEPTIBILITIES TO FOLLOW Performed at [facility], [place] [address], [place], [place] [address]    Report Status PENDING  Incomplete  Blood Culture ID Panel (Reflexed)     Status: Abnormal   Collection Time: 10/27/17  1:29 AM  Result Value Ref Range Status   Enterococcus species NOT DETECTED NOT DETECTED Final   Listeria monocytogenes NOT DETECTED NOT DETECTED Final   Staphylococcus species NOT DETECTED NOT DETECTED Final   Staphylococcus aureus NOT DETECTED NOT DETECTED Final   Streptococcus species NOT DETECTED NOT DETECTED Final   Streptococcus agalactiae NOT DETECTED NOT DETECTED Final   Streptococcus pneumoniae NOT DETECTED NOT DETECTED Final   Streptococcus pyogenes NOT DETECTED NOT DETECTED Final   Acinetobacter baumannii  NOT DETECTED NOT DETECTED Final   Enterobacteriaceae species DETECTED (A) NOT DETECTED Final    Comment: Enterobacteriaceae represent a large family of gram-negative bacteria, not a single organism. CRITICAL RESULT CALLED TO, READ BACK BY AND VERIFIED WITH: PJARMD J LEGGE [id number] 2032 MLM    Enterobacter cloacae complex NOT DETECTED NOT DETECTED Final   Escherichia coli DETECTED (A) NOT DETECTED Final    Comment: CRITICAL RESULT CALLED TO, READ BACK BY AND VERIFIED WITH: PHARMD J LEGGE [id number] 2032 MLM    Klebsiella oxytoca NOT DETECTED NOT DETECTED Final   Klebsiella pneumoniae NOT DETECTED NOT DETECTED Final   Proteus species NOT DETECTED NOT DETECTED Final   Serratia marcescens NOT DETECTED NOT DETECTED Final   Carbapenem resistance NOT DETECTED NOT DETECTED Final   Haemophilus influenzae NOT DETECTED NOT DETECTED Final   Neisseria meningitidis NOT DETECTED NOT DETECTED Final   Pseudomonas aeruginosa NOT DETECTED NOT DETECTED Final   Candida albicans NOT DETECTED NOT DETECTED Final   Candida glabrata NOT DETECTED NOT DETECTED Final   Candida krusei NOT DETECTED NOT DETECTED Final   Candida parapsilosis NOT DETECTED NOT DETECTED Final   Candida tropicalis NOT DETECTED NOT DETECTED Final    Comment: Performed at [facility], [place] [address], [place], [place] [address]         Radiology Studies: Dg Abdomen 1 View  Result Date: 10/27/2017 CLINICAL DATA:  Ureteral stents. Minimal urine output. Abdominal pain. EXAM: ABDOMEN - 1 VIEW COMPARISON:  Fluoroscopy 10/13/2017. CT abdomen and pelvis 10/11/2016 FINDINGS: Bilateral ureteral stents are in place without change in position since the previous fluoroscopic images. No radiopaque stones are identified. Left lower quadrant colostomy. Scattered gas and stool throughout the colon. No small or large bowel distention. Degenerative changes and postoperative changes in the lumbar spine. Lumbar scoliosis convex towards the left.  Degenerative changes in the hips. IMPRESSION: Bilateral ureteral stents appear unchanged in position since previous study. Stool-filled colon. No evidence of bowel obstruction. Electronically Signed   By: Lucienne Capers M.D.   On: 10/27/2017 02:37   Ct Venogram Abd/pel  Result Date: 10/27/2017 CLINICAL DATA:  62 year old female with a history of pulmonary embolism EXAM: CTA ABDOMEN AND PELVIS WITH CONTRAST TECHNIQUE: Multidetector CT imaging of the abdomen and pelvis was performed using the standard protocol during bolus administration of intravenous contrast. Multiplanar reconstructed images and MIPs were obtained and reviewed to evaluate  the vascular anatomy. CONTRAST:  125mL ISOVUE-370 IOPAMIDOL (ISOVUE-370) INJECTION 76% COMPARISON:  CT a chest 10/22/2017, abdominal CT 10/11/2017, 10/08/2017, 06/19/2017, 12/25/2016 FINDINGS: VASCULAR Aorta: Mild atherosclerotic changes of the aorta with no significant calcifications. No aneurysm or dissection. Celiac: Celiac artery patent with no greater than 50% narrowing at the origin. SMA: Superior mesenteric artery patent with no significant atherosclerotic changes at the origin. Renals: Bilateral renal arteries are patent with no atherosclerotic changes at the origin. IMA: Inferior mesenteric artery patent. Right lower extremity: Minimal atherosclerotic changes of the right iliac system. Right iliac arterial system and proximal femoral arteries patent. Left lower extremity: Minimal atherosclerotic changes of the left iliac system. Left iliac system and the proximal left femoral arteries are patent. Veins: Unremarkable appearance of the portal system, with patency of the splenic vein, portal spleno confluence, and the portal veins. Superior mesenteric vein and inferior mesenteric vein are patent. The IVC is patent at the liver, juxtarenal IVC, and infrarenal IVC. There is compression and attenuation of the left common iliac vein at typical location of May-Thurner  anatomy. Left pelvic iliac veins are partially opacified. The left external iliac vein is compressed by the pelvic mass with no appreciable contrast opacification. The left common femoral vein demonstrates nonocclusive thrombus. The right common iliac vein and external iliac vein are patent. No filling defects identified within the right-sided iliac venous system or the right common femoral vein. Collateral venous drainage from left pelvis to right pelvis within the superficial/body wall veins. Review of the MIP images confirms the above findings. NON-VASCULAR Lower chest: No acute. Hiatal hernia with retained material within the thoracic esophagus and stomach above the hiatus. Hepatobiliary: Unremarkable appearance of liver with no focal lesion identified. Unremarkable gallbladder with no cholelithiasis. No intrahepatic or extrahepatic biliary ductal dilatation. Pancreas: Unremarkable pancreas. Spleen: Unremarkable spleen Adrenals/Urinary Tract: Unremarkable adrenal glands. Right: Double-J ureteral stent within the right ureter, with the proximal loop formed in the hilum and the distal loop formed within decompressed urinary bladder. Since the CT dated 10/08/2017, there has been development of differential attenuation of the right-sided kidney parenchyma, with a clear demarcation of decreased perfusion/edema of the inferior cortex of the right kidney. There is mild dilation of the inferior pole moiety collecting system, with mild dilation of the proximal inferior pole ureter, which is lateral to the ureteral stent. The site of connection of the partially duplicated right-sided ureters not identified on the study. Left: Double-J ureteral stent on the left, with the proximal loop formed within the upper pole moiety collecting system and the distal loop formed in the decompressed distal ureter. Since the prior CT 10/08/2017, there is continued differential attenuation of the left-sided kidney parenchyma, with clear  demarcation of decreased perfusion/edema of the superior pole moiety of the left kidney. There is mild dilation of the inferior pole moiety collecting system as well as mild dilation of the inferior pole ureter. The site at which the partially duplicated ureters join is not identified on the current study. Urinary bladder decompressed. Catheter within the urinary bladder at the anterior pelvis, displaced by the pelvic mass. Stomach/Bowel: Hiatal hernia. Retained debris/material within the thoracic esophagus and stomach. No significant dilation of small bowel. Ostomy of the left abdomen. Moderate degree of formed stool within the colon. Lymphatic: Since the prior CT there has been development of body wall anasarca/edema, as well as small volume ascites and edema throughout the abdominal mesentery/fat. Again, small lymph nodes in the periaortic/preaortic nodal stations. Reproductive: Redemonstration of partially cystic  and solid right ovarian mass/pelvic mass. The pelvic mass contributes to mass effect on the adjacent urinary bladder, bilateral ureters, and the vasculature, as above. Greatest diameter on the current CT measures 17 cm, which is increased from the comparison CT, potentially secondary to differences in technique, or actual growth. Other: Edema of the proximal left leg Musculoskeletal: Accentuated kyphotic curvature. No displaced fracture. Surgical changes of the lumbar spine. IMPRESSION: The CT venogram study is positive for nonocclusive DVT of the left common femoral vein. There is compression or potentially occlusion of the left iliac venous system secondary to mass effect from the pelvic mass. Associated collateral venous drainage in the body wall, with proximal left leg edema. Since the prior CT, there is now differential perfusion/edema of the bilateral kidneys, new on the right and similar to the left. The right kidney is affected at the inferior pole and the left kidney at the superior pole. The  edema is favored to be secondary to at least partial obstruction of the right inferior pole moiety and left superior pole moiety. On the left, this would imply a blocked ureteral stent. Differential includes focal infection/pyelonephritis, or less likely focal infarction from either arterial or venous source. 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 The CT is nondiagnostic to identify whether there is complete duplication on either/both the left or the right. Single bilateral double-J ureteral stents. Both stents appear to drain the upper pole moiety. Increasing body wall and mesenteric edema. Recommend correlation with fluid status. Developing small volume ascites. 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 Diameter measures 17 cm on axial images on today's study, increased from prior, which may be either actual growth or differences in technique/measurement. Again, pelvic mass contributes to compression of the left iliac vasculature and the bilateral ureters. Surgical changes of colon ostomy. Electronically Signed   By: Corrie Mckusick D.O.   On: 10/27/2017 11:28        Scheduled Meds: . DULoxetine  60 mg Oral QPM  . enoxaparin (LOVENOX) injection  60 mg Subcutaneous Q12H  . methadone  12.5 mg Oral TID  . multivitamin with minerals  1 tablet Oral Daily  . potassium chloride  10 mEq Oral BID   Continuous Infusions: . sodium chloride 75 mL/hr at 10/28/17 0835  . ceFEPime (MAXIPIME) IV Stopped (10/28/17 1028)     LOS: 1 day    Time spent: 30 minutes    Edwin Dada, MD [facility] 10/28/2017, 12:39 PM     Pager [phone] --- please page though AMION:  [email or web address] If 7PM-7AM, please contact night-coverage

## 2017-10-28 NOTE — Plan of Care (Signed)
  Progressing Activity: Risk for activity intolerance will decrease 10/28/2017 1027 - Progressing by Saunders Glance, RN Nutrition: Adequate nutrition will be maintained 10/28/2017 1027 - Progressing by Saunders Glance, RN Coping: Level of anxiety will decrease 10/28/2017 1027 - Progressing by Saunders Glance, RN Safety: Ability to remain free from injury will improve 10/28/2017 1027 - Progressing by Saunders Glance, RN

## 2017-10-28 NOTE — Progress Notes (Signed)
IP PROGRESS NOTE  Subjective:   Carolyn Barton was discharged 10/24/2017 after an admission with acute pulmonary emboli.  She is maintained on Lovenox.  She reports improvement in dyspnea. She was readmitted yesterday with severe pain in the left leg and back.  The Foley catheter was removed on 10/26/2017, but she had urinary retention requiring replacement of the Foley catheter.  She reports Dilaudid is helping the pain.  A CT venogram 10/27/2017 revealed a nonocclusive DVT in the left common femoral vein.  Compression or potential occlusion of the left iliac venous system was also noted.  Increased body wall edema was noted.  The pelvic mass appears to arise from the right ovary.  She had a fever yesterday.  Blood and urine cultures returned positive for gram-negative rods.  E. coli was confirmed on a urine culture.  Objective: Vital signs in last 24 hours: Blood pressure 105/67, pulse (!) 114, temperature 98.5 F (36.9 C), temperature source Oral, resp. rate 20, height '5\' 5"'$  (1.651 m), weight 141 lb 5 oz (64.1 kg), SpO2 100 %.  Intake/Output from previous day: 01/29 0701 - 01/30 0700 In: 2738.3 [P.O.:360; I.V.:2228.3; IV Piggyback:150] Out: 1025 [Urine:1025]  Physical Exam:  HEENT: No thrush Lungs: Clear bilaterally Cardiac: Regular rate and rhythm Abdomen: Soft, left lower quadrant colostomy Extremities: Edema throughout the leg  Portacath/PICC-without erythema  Lab Results: Recent Labs    10/26/17 2336 10/27/17 0644  WBC 1.2* 13.8*  HGB 12.2 10.7*  HCT 36.8 31.5*  PLT 286 197    BMET Recent Labs    10/26/17 2336 10/27/17 0644  NA 134* 132*  K 4.5 3.1*  CL 99* 101  CO2 23 23  GLUCOSE 139* 114*  BUN 18 15  CREATININE 0.75 0.83  CALCIUM 7.8* 7.3*    Lab Results  Component Value Date   CEA1 1.32 10/12/2017    Studies/Results: Dg Abdomen 1 View  Result Date: 10/27/2017 CLINICAL DATA:  Ureteral stents. Minimal urine output. Abdominal pain. EXAM: ABDOMEN - 1  VIEW COMPARISON:  Fluoroscopy 10/13/2017. CT abdomen and pelvis 10/11/2016 FINDINGS: Bilateral ureteral stents are in place without change in position since the previous fluoroscopic images. No radiopaque stones are identified. Left lower quadrant colostomy. Scattered gas and stool throughout the colon. No small or large bowel distention. Degenerative changes and postoperative changes in the lumbar spine. Lumbar scoliosis convex towards the left. Degenerative changes in the hips. IMPRESSION: Bilateral ureteral stents appear unchanged in position since previous study. Stool-filled colon. No evidence of bowel obstruction. Electronically Signed   By: Lucienne Capers M.D.   On: 10/27/2017 02:37   Ct Venogram Abd/pel  Result Date: 10/27/2017 CLINICAL DATA:  62 year old female with a history of pulmonary embolism EXAM: CTA ABDOMEN AND PELVIS WITH CONTRAST TECHNIQUE: Multidetector CT imaging of the abdomen and pelvis was performed using the standard protocol during bolus administration of intravenous contrast. Multiplanar reconstructed images and MIPs were obtained and reviewed to evaluate the vascular anatomy. CONTRAST:  175m ISOVUE-370 IOPAMIDOL (ISOVUE-370) INJECTION 76% COMPARISON:  CT a chest 10/22/2017, abdominal CT 10/11/2017, 10/08/2017, 06/19/2017, 12/25/2016 FINDINGS: VASCULAR Aorta: Mild atherosclerotic changes of the aorta with no significant calcifications. No aneurysm or dissection. Celiac: Celiac artery patent with no greater than 50% narrowing at the origin. SMA: Superior mesenteric artery patent with no significant atherosclerotic changes at the origin. Renals: Bilateral renal arteries are patent with no atherosclerotic changes at the origin. IMA: Inferior mesenteric artery patent. Right lower extremity: Minimal atherosclerotic changes of the right iliac system. Right  iliac arterial system and proximal femoral arteries patent. Left lower extremity: Minimal atherosclerotic changes of the left iliac  system. Left iliac system and the proximal left femoral arteries are patent. Veins: Unremarkable appearance of the portal system, with patency of the splenic vein, portal spleno confluence, and the portal veins. Superior mesenteric vein and inferior mesenteric vein are patent. The IVC is patent at the liver, juxtarenal IVC, and infrarenal IVC. There is compression and attenuation of the left common iliac vein at typical location of May-Thurner anatomy. Left pelvic iliac veins are partially opacified. The left external iliac vein is compressed by the pelvic mass with no appreciable contrast opacification. The left common femoral vein demonstrates nonocclusive thrombus. The right common iliac vein and external iliac vein are patent. No filling defects identified within the right-sided iliac venous system or the right common femoral vein. Collateral venous drainage from left pelvis to right pelvis within the superficial/body wall veins. Review of the MIP images confirms the above findings. NON-VASCULAR Lower chest: No acute. Hiatal hernia with retained material within the thoracic esophagus and stomach above the hiatus. Hepatobiliary: Unremarkable appearance of liver with no focal lesion identified. Unremarkable gallbladder with no cholelithiasis. No intrahepatic or extrahepatic biliary ductal dilatation. Pancreas: Unremarkable pancreas. Spleen: Unremarkable spleen Adrenals/Urinary Tract: Unremarkable adrenal glands. Right: Double-J ureteral stent within the right ureter, with the proximal loop formed in the hilum and the distal loop formed within decompressed urinary bladder. Since the CT dated 10/08/2017, there has been development of differential attenuation of the right-sided kidney parenchyma, with a clear demarcation of decreased perfusion/edema of the inferior cortex of the right kidney. There is mild dilation of the inferior pole moiety collecting system, with mild dilation of the proximal inferior pole ureter,  which is lateral to the ureteral stent. The site of connection of the partially duplicated right-sided ureters not identified on the study. Left: Double-J ureteral stent on the left, with the proximal loop formed within the upper pole moiety collecting system and the distal loop formed in the decompressed distal ureter. Since the prior CT 10/08/2017, there is continued differential attenuation of the left-sided kidney parenchyma, with clear demarcation of decreased perfusion/edema of the superior pole moiety of the left kidney. There is mild dilation of the inferior pole moiety collecting system as well as mild dilation of the inferior pole ureter. The site at which the partially duplicated ureters join is not identified on the current study. Urinary bladder decompressed. Catheter within the urinary bladder at the anterior pelvis, displaced by the pelvic mass. Stomach/Bowel: Hiatal hernia. Retained debris/material within the thoracic esophagus and stomach. No significant dilation of small bowel. Ostomy of the left abdomen. Moderate degree of formed stool within the colon. Lymphatic: Since the prior CT there has been development of body wall anasarca/edema, as well as small volume ascites and edema throughout the abdominal mesentery/fat. Again, small lymph nodes in the periaortic/preaortic nodal stations. Reproductive: Redemonstration of partially cystic and solid right ovarian mass/pelvic mass. The pelvic mass contributes to mass effect on the adjacent urinary bladder, bilateral ureters, and the vasculature, as above. Greatest diameter on the current CT measures 17 cm, which is increased from the comparison CT, potentially secondary to differences in technique, or actual growth. Other: Edema of the proximal left leg Musculoskeletal: Accentuated kyphotic curvature. No displaced fracture. Surgical changes of the lumbar spine. IMPRESSION: The CT venogram study is positive for nonocclusive DVT of the left common femoral  vein. There is compression or potentially occlusion of the left  iliac venous system secondary to mass effect from the pelvic mass. Associated collateral venous drainage in the body wall, with proximal left leg edema. Since the prior CT, there is now differential perfusion/edema of the bilateral kidneys, new on the right and similar to the left. The right kidney is affected at the inferior pole and the left kidney at the superior pole. The edema is favored to be secondary to at least partial obstruction of the right inferior pole moiety and left superior pole moiety. On the left, this would imply a blocked ureteral stent. Differential includes focal infection/pyelonephritis, or less likely focal infarction from either arterial or venous source. 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 The CT is nondiagnostic to identify whether there is complete duplication on either/both the left or the right. Single bilateral double-J ureteral stents. Both stents appear to drain the upper pole moiety. Increasing body wall and mesenteric edema. Recommend correlation with fluid status. Developing small volume ascites. 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 Diameter measures 17 cm on axial images on today's study, increased from prior, which may be either actual growth or differences in technique/measurement. Again, pelvic mass contributes to compression of the left iliac vasculature and the bilateral ureters. Surgical changes of colon ostomy. Electronically Signed   By: Corrie Mckusick D.O.   On: 10/27/2017 11:28    Medications: I have reviewed the patient's current medications.  Assessment/Plan:  1.Metastatic colorectal cancer-status post an exploratory laparotomy 03/03/2016 revealing a proximal rectal mass, peritoneal carcinomatosis, and liver metastases  Biopsy of peritoneal nodules 03/03/2016 confirmed metastatic adenocarcinoma  consistent with a colon primary  Foundation 1 testing-MSI-stable, tumor mutation burden-low, no BRAF NRAS or KRAS mutation  Cycle 1 FOLFIRI/PANITUMUMAB 04/14/2016  Cycle 2 FOLFIRI/panitumumab 04/28/2016  Cycle 3 FOLFIRI/panitumumab 05/12/2016  Cycle 4 FOLFIRI/panitumumab 05/27/2016  Cycle 5 FOLFIRI/panitumumab 06/09/2016  Restaging CT abdomen/pelvis 06/20/2016-no evidence of disease progression, decreased left hepatic lesion  Cycle 6 FOLFIRI/panitumumab 06/23/2016  Cycle 7 FOLFIRI/panitumumab 07/07/2016  Cycle 8 FOLFIRI/panitumumab 07/21/2016  Cycle 9 FOLFIRI/PANITUMUMAB 08/04/2016  Cycle 10 FOLFIRI/panitumumab 08/18/2016  Restaging CT 08/29/2016 with interval decrease in the size of the medial segment left liver lesion. Lesion identified previously in the rectosigmoid colon not evident on the current study.  5-FU/PANITUMUMAB 09/01/2016  Xeloda 7 days on/7 days off and PANITUMUMAB every 3 weeks 09/15/2016 (awaiting insurance approval for Xeloda 09/15/2016)  CT abdomen/pelvis 12/25/2016-unchanged 5 mm right hepatic lesion, resolution of medial segment left liver lesion, no new liver lesion. Residual soft tissue fullness in the sigmoid , persistent distal esophageal wall thickening  Continuationof PANITUMUMAB every 3 weeks and Xeloda 7 days on/7 days off  CT 06/19/2017-new cystic/solid lesion in the left adnexa  Xeloda/panitumumab continued  CT 10/08/2016-progression of cystic pelvic mass  Cycle 1 FOLFIRI/Panitumumab 10/21/2017  2.History of aBowel obstruction secondary to #1  3. Chronic back pain  4. Essential thrombocytosis  5. Early obstruction of the left ureter noted at the time of surgery 03/03/2016-Dr. Tannenbaum placed a left double-J stent 03/18/2016  New onset right hydronephrosis 10/11/2017  Right ureter stent placement 10/13/2017  6. Abdominal wall cellulitis 03/10/2016, blood cultures positive for coagulase negative  staphylococcus-methicillin-resistant, status post treatment with vancomycin and Bactrim   8. Port-A-Cath placement 04/10/2016, interventional radiology  9. Iron deficiency anemia. Feraheme 11/05/2015 , 11/12/2015,and 04/14/2016-persistent anemia and red cell microcytosis, oral iron resumed 11/17/2016-stable  10. Pain/tenderness left posterior iliac region-resolved  11. Hypokalemia-started on potassium replacement 06/09/2016  12.  History of skin  rashand [facility]chiasecond[facility] to [facility], she continues minocycline, moisturizers, and fluticasone as needed  13.Acute onset right low back pain 10/11/2017-likely secondary to obstruction of the right kidney;left ureter stent exchange and right ureter stent placement 10/13/2017  14.Left leg pain and edema-bilateral lower extremity venous Doppler negative for DVT 10/13/2017 and 10/19/2017. I suspect she has a pelvic DVT  15.  Admission 10/22/2017 with bilateral pulmonary embolism, heparin drip initiated, converted to Lovenox 10/23/2017  16.  Admission 10/27/2017 with increased back/leg pain and a fever-blood and urine cultures positive for gram-negative rods   Carolyn Barton has metastatic colon cancer.  She has completed 1 cycle of salvage therapy with FOLFIRI/panitumumab after developing a progressive pelvic mass.  She was diagnosed with pulmonary embolism last week, likely related to a pelvic/femoral DVT.  She is maintained on Lovenox.  She is now admitted with E. coli bacteremia, most likely from a urinary source.  She has increased pain secondary to the pelvic mass causing obstructive swelling of the left lower extremity and potentially sacral involvement.  He was scheduled for  attempted drainage of the cystic pelvic mass in interventional radiology on 10/29/2017.  I will discuss the case with interventional radiology.  Recommendations: 1.  Continue antibiotics for the E. coli bacteremia 2.  Lovenox for treatment of the  DVT and pulmonary emboli 3.  Narcotic analgesics for pain- I appreciate up from the palliative care service 4.  Outpatient follow-up will be scheduled at the Cancer center for the week of 11/02/2017 for an office visit and cycle 2 FOLFIRI/panitumumab.       LOS: 1 day   [name] Coder, MD   10/28/2017, 10:11 AM

## 2017-10-28 NOTE — Consult Note (Signed)
Palliative care consult note  Reason for Consult: Pain management in light of metastatic colon cancer.  Chart reviewed, discussed with bedside RN, Dr. Benay Spice, and Dr. Loleta Books  I met today with Carolyn Barton.  She reports hearing good things about our team from her brother, Carolyn Barton, who is in chaplain residency.  I introduced palliative care as specialized medical care for people living with serious illness. It focuses on providing relief from the symptoms and stress of a serious illness. The goal is to improve quality of life for both the patient and the family.  We discussed her pain at length.  She reports that over the past 2 weeks, her pain has been increasing and has been poorly controlled overall.  She follows as an outpatient with Carolyn Barton at [facility].    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  I asked her about this and she reports that she doesn't always take her methadone three times per day.  She "usually" takes it either 2 or 3 times daily, but she has also not run out of pills from this refill (although she has been in the hospital for a portion of this as well.   I also reviewed her MAR report this admission which reveals that she was having uncontrolled pain and receiving dilaudid regularly ('10mg'$  IV in one 24 hour period).  After increasing methadone to 12.'5mg'$  TID, her rescue usage dropped to zero today.  As methadone has such a long half life, I am concerned that this significant decrease in rescue usage is an indicator that this may be too much medication once it reaches steady state.  In talking with her today, it appears that she has not maintained consistency in her methadone regimen and has been getting between 20-'30mg'$  depending on the day.  - Will hold evening dose of methadone. - Resume prior prescribed regimen of '10mg'$  methadone TID.  This is likely actually  still an increase over what she has been taking at home per her report. - Plan for MSIR for breakthrough pain.  Goal I discussed with Dr. Loleta Books is to focus on oral rescue medications in hopes of getting closer to discharge.  If her pain is not controlled on this, would resume with dilaudid '1mg'$  IV every 2 hours as needed. - I will plan to call and coordinate with her OP pain management team tomorrow.  Total time: 80 minutes Greater than 50%  of this time was spent counseling and coordinating care related to the above assessment and plan.  Micheline Rough, MD [place] [facility] [phone]

## 2017-10-29 ENCOUNTER — Encounter (HOSPITAL_COMMUNITY): Payer: Self-pay

## 2017-10-29 ENCOUNTER — Ambulatory Visit (HOSPITAL_COMMUNITY)
Admission: RE | Admit: 2017-10-29 | Discharge: 2017-10-29 | Disposition: A | Discharge status: Home / Self Care | Payer: BLUE CROSS/BLUE SHIELD | Source: Ambulatory Visit | Attending: Oncology | Admitting: Oncology

## 2017-10-29 ENCOUNTER — Encounter: Payer: Self-pay | Admitting: General Practice

## 2017-10-29 DIAGNOSIS — R600 Localized edema: Secondary | ICD-10-CM

## 2017-10-29 DIAGNOSIS — R102 Pelvic and perineal pain: Secondary | ICD-10-CM

## 2017-10-29 LAB — CBC
HEMATOCRIT: 29.4 % — AB (ref 36.0–46.0)
Hemoglobin: 9.9 g/dL — ABNORMAL LOW (ref 12.0–15.0)
MCH: 28.9 pg (ref 26.0–34.0)
MCHC: 33.7 g/dL (ref 30.0–36.0)
MCV: 86 fL (ref 78.0–100.0)
Platelets: 73 10*3/uL — ABNORMAL LOW (ref 150–400)
RBC: 3.42 MIL/uL — AB (ref 3.87–5.11)
RDW: 16.2 % — ABNORMAL HIGH (ref 11.5–15.5)
WBC: 18.8 10*3/uL — ABNORMAL HIGH (ref 4.0–10.5)

## 2017-10-29 LAB — BASIC METABOLIC PANEL
ANION GAP: 6 (ref 5–15)
BUN: 16 mg/dL (ref 6–20)
CALCIUM: 7.4 mg/dL — AB (ref 8.9–10.3)
CHLORIDE: 106 mmol/L (ref 101–111)
CO2: 21 mmol/L — ABNORMAL LOW (ref 22–32)
Creatinine, Ser: 0.76 mg/dL (ref 0.44–1.00)
GFR calc non Af Amer: >60 mL/min (ref >60)
GLUCOSE: 63 mg/dL — AB (ref 65–99)
POTASSIUM: 3.5 mmol/L (ref 3.5–5.1)
Sodium: 133 mmol/L — ABNORMAL LOW (ref 135–145)

## 2017-10-29 LAB — CULTURE, BLOOD (ROUTINE X 2): Special Requests: ADEQUATE

## 2017-10-29 LAB — URINE CULTURE: Culture: >=100000 — AB

## 2017-10-29 MED ORDER — ENOXAPARIN SODIUM 60 MG/0.6ML ~~LOC~~ SOLN: 60.0000 mg | Freq: Two times a day (BID) | SUBCUTANEOUS | Status: DC

## 2017-10-29 MED ORDER — METHADONE HCL 5 MG PO TABS
10.0000 mg | ORAL_TABLET | Freq: Three times a day (TID) | ORAL | Status: DC
  Administered 2017-10-29 – 2017-11-02 (×11): 10 mg via ORAL
  Filled 2017-10-29 (×11): qty 2

## 2017-10-29 MED ORDER — HEPARIN (PORCINE) IN NACL 100-0.45 UNIT/ML-% IJ SOLN
1100.0000 [IU]/h | INTRAMUSCULAR | Status: DC
  Administered 2017-10-29: 1100 [IU]/h via INTRAVENOUS
  Filled 2017-10-29: qty 250

## 2017-10-29 NOTE — Progress Notes (Signed)
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  10/29/2017-she denies any nausea or vomiting today however her appetite is diminished.  Still have abdominal pain and distention.   Assessment & Plan:   Principal Problem:   Lower urinary tract infectious disease Active Problems:   Malnutrition of moderate degree   Rectal cancer (HCC)   Metastatic colorectal cancer (HCC)   Bilateral pulmonary embolism (HCC)   Tachycardia   Urinary retention   Chemotherapy-induced neutropenia (HCC)   UTI (urinary tract infection)   Left femoral vein DVT (HCC)   Sepsis (HCC)   Acute pyelonephritis   Sepsis from E. coli bacteremia, pyelonephritis Case was discussed with urology, they felt given bilateral ureteral  Stents in place on CT venogram, no intervention needed at this time.  leukocytosis worse.monitor. -Stop cefepime, start ceftriaxone -Continue IV fluids  Complex cystic pelvic mass-significant interval enlargement from 5 x 4 cm to 13 x 13 cm.  Appears more cystic ovarian neoplasm than colon cancer.  Patient did go for IR drainage tomorrow.will bridge  With heparin.  Chronic abdominal pain -Continue methadone -Transition today from hydromorphone to MSIR -Continue duloxetine  Sinus tachycardia Chronic  Newly diagnosed bilateral pulmonary embolism Femoral DVT -Hold Lovenox.bridge with heparin.       DVT prophylaxis:  Lovenox Code Status full code Family Communication none : Disposition Plan: TBD  Consultants:  ir  Procedures:none Antimicrobials Rocephin  Subjective:no new complaints  Objective: Vitals:   10/28/17 0445 10/28/17 1234 10/28/17 2215 10/29/17 0549  BP: 105/67 112/60 109/69 114/77  Pulse: (!) 114 (!) 118 (!) 110 (!) 109  Resp: 20  18 18   Temp: 98.5 F (36.9 C) 97.8 F (36.6 C) 98 F (36.7 C) 98 F (36.7 C)  TempSrc: Oral Oral Oral Oral  SpO2: 100% 99% 100% 100%  Weight:      Height:        Intake/Output Summary (Last 24 hours) at 10/29/2017 1216 Last data filed at 10/29/2017 0539 Gross per 24 hour  Intake 2268.75 ml  Output 1600 ml  Net 668.75 ml   Filed Weights   10/26/17 2138 10/27/17 0301  Weight: 62.6 kg (138 lb) 64.1 kg (141 lb 5 oz)    Examination:  General exam: Appears calm and comfortable  Respiratory system: Clear to auscultation. Respiratory effort normal. Cardiovascular system: S1 & S2 heard, RRR. No JVD, murmurs, rubs, gallops or clicks. No pedal edema. Gastrointestinal system: Abdomen is nondistended, soft and nontender. No organomegaly or masses felt. Normal bowel sounds heard. Central nervous system: Alert and oriented. No focal neurological deficits. Extremities: lle edema  Skin: No rashes, lesions or ulcers Psychiatry: Judgement and insight appear normal. Mood & affect appropriate.     Data Reviewed: I have personally reviewed following labs and imaging studies  CBC: Recent Labs  Lab 10/22/17 1746  10/23/17 0252 10/24/17 0419 10/26/17 2336 10/27/17 0644 10/29/17 0632  WBC 10.8*  --  10.8* 10.2 1.2* 13.8* 18.8*  NEUTROABS 8.8*  --   --   --  1.0*  --   --  HGB 14.2   < > 12.6 11.6* 12.2 10.7* 9.9*  HCT 43.1   < > 38.2 35.2* 36.8 31.5* 29.4*  MCV 88.7  --  88.8 89.3 86.8 87.5 86.0  PLT 783*  --  763* 669* 286 197 73*   < > = values in this interval not displayed.   Basic Metabolic Panel: Recent Labs  Lab 10/23/17 0252  10/26/17 2336 10/27/17 0644 10/29/17 0352 10/29/17 0632  NA 134* 134* 132* QUESTIONABLE RESULTS, RECOMMEND RECOLLECT TO VERIFY 133*  K 3.7 4.5 3.1* QUESTIONABLE RESULTS, RECOMMEND RECOLLECT TO VERIFY 3.5  CL 100* 99* 101 QUESTIONABLE RESULTS, RECOMMEND RECOLLECT TO VERIFY 106  CO2 26 23 23  QUESTIONABLE RESULTS, RECOMMEND RECOLLECT TO VERIFY 21*  GLUCOSE 119* 139* 114* QUESTIONABLE RESULTS, RECOMMEND RECOLLECT TO VERIFY 63*  BUN 17 18 15  QUESTIONABLE RESULTS, RECOMMEND RECOLLECT TO VERIFY 16  CREATININE 0.87 0.75 0.83 QUESTIONABLE RESULTS, RECOMMEND RECOLLECT TO VERIFY 0.76  CALCIUM 8.6* 7.8* 7.3* QUESTIONABLE RESULTS, RECOMMEND RECOLLECT TO VERIFY 7.4*   GFR: Estimated Creatinine Clearance: 67.3 mL/min (by C-G formula based on SCr of 0.76 mg/dL). Liver Function Tests: No results for input(s): AST, ALT, ALKPHOS, BILITOT, PROT, ALBUMIN in the last 168 hours. No results for input(s): LIPASE, AMYLASE in the last 168 hours. No results for input(s): AMMONIA in the last 168 hours. Coagulation Profile: No results for input(s): INR, PROTIME in the last 168 hours. Cardiac Enzymes: Recent Labs  Lab 10/22/17 2116  TROPONINI <0.03   BNP (last 3 results) No results for input(s): PROBNP in the last 8760 hours. HbA1C: No results for input(s): HGBA1C in the last 72 hours. CBG: No results for input(s): GLUCAP in the last 168 hours. Lipid Profile: No results for input(s): CHOL, HDL, LDLCALC, TRIG, CHOLHDL, LDLDIRECT in the last 72 hours. Thyroid Function Tests: No results for input(s): TSH, T4TOTAL, FREET4, T3FREE, THYROIDAB in the last 72 hours. Anemia Panel: No results for input(s): VITAMINB12, FOLATE, FERRITIN, TIBC, IRON, RETICCTPCT in the last 72 hours. Sepsis Labs: Recent Labs  Lab 10/27/17 0142  LATICACIDVEN 1.50    Recent Results (from the past 240 hour(s))  Urine C&S     Status: Abnormal   Collection Time: 10/26/17 11:11 PM  Result Value Ref Range Status   Specimen  Description URINE, CATHETERIZED  Final   Special Requests Immunocompromised  Final   Culture >=100,000 COLONIES/mL ESCHERICHIA COLI (A)  Final   Report Status 10/29/2017 FINAL  Final   Organism ID, Bacteria ESCHERICHIA COLI (A)  Final      Susceptibility   Escherichia coli - MIC*    AMPICILLIN >=32 RESISTANT Resistant     CEFAZOLIN <=4 SENSITIVE Sensitive     CEFTRIAXONE <=1 SENSITIVE Sensitive     CIPROFLOXACIN >=4 RESISTANT Resistant     GENTAMICIN <=1 SENSITIVE Sensitive     IMIPENEM <=0.25 SENSITIVE Sensitive     NITROFURANTOIN <=16 SENSITIVE Sensitive     TRIMETH/SULFA >=320 RESISTANT Resistant     AMPICILLIN/SULBACTAM 8 SENSITIVE Sensitive     PIP/TAZO <=4 SENSITIVE Sensitive     Extended ESBL NEGATIVE Sensitive     * >=100,000 COLONIES/mL ESCHERICHIA COLI  Blood Culture (routine x 2)     Status: None (Preliminary result)   Collection Time: 10/27/17  1:29 AM  Result Value Ref Range Status   Specimen Description BLOOD RIGHT HAND  Final   Special Requests IN PEDIATRIC BOTTLE Blood Culture adequate volume  Final   Culture  Setup Time   Final    GRAM NEGATIVE  RODS IN PEDIATRIC BOTTLE CRITICAL VALUE NOTED.  VALUE IS CONSISTENT WITH PREVIOUSLY REPORTED AND CALLED VALUE.    Culture   Final    GRAM NEGATIVE RODS IDENTIFICATION TO FOLLOW Performed at [facility], [place] [address], [place], [place] [address]    Report Status PENDING  Incomplete  Blood Culture (routine x 2)     Status: Abnormal   Collection Time: 10/27/17  1:29 AM  Result Value Ref Range Status   Specimen Description BLOOD LEFT HAND  Final   Special Requests IN PEDIATRIC BOTTLE Blood Culture adequate volume  Final   Culture  Setup Time   Final    GRAM NEGATIVE RODS IN PEDIATRIC BOTTLE CRITICAL RESULT CALLED TO, READ BACK BY AND VERIFIED WITH: Otila Back [id number] 2032 MLM Performed at [facility], [place] [address], [place], [place] [address]    Culture ESCHERICHIA COLI (A)  Final   Report Status  10/29/2017 FINAL  Final   Organism ID, Bacteria ESCHERICHIA COLI  Final      Susceptibility   Escherichia coli - MIC*    AMPICILLIN >=32 RESISTANT Resistant     CEFAZOLIN <=4 SENSITIVE Sensitive     CEFEPIME <=1 SENSITIVE Sensitive     CEFTAZIDIME <=1 SENSITIVE Sensitive     CEFTRIAXONE <=1 SENSITIVE Sensitive     CIPROFLOXACIN >=4 RESISTANT Resistant     GENTAMICIN <=1 SENSITIVE Sensitive     IMIPENEM <=0.25 SENSITIVE Sensitive     TRIMETH/SULFA >=320 RESISTANT Resistant     AMPICILLIN/SULBACTAM 16 INTERMEDIATE Intermediate     PIP/TAZO <=4 SENSITIVE Sensitive     Extended ESBL NEGATIVE Sensitive     * ESCHERICHIA COLI  Blood Culture ID Panel (Reflexed)     Status: Abnormal   Collection Time: 10/27/17  1:29 AM  Result Value Ref Range Status   Enterococcus species NOT DETECTED NOT DETECTED Final   Listeria monocytogenes NOT DETECTED NOT DETECTED Final   Staphylococcus species NOT DETECTED NOT DETECTED Final   Staphylococcus aureus NOT DETECTED NOT DETECTED Final   Streptococcus species NOT DETECTED NOT DETECTED Final   Streptococcus agalactiae NOT DETECTED NOT DETECTED Final   Streptococcus pneumoniae NOT DETECTED NOT DETECTED Final   Streptococcus pyogenes NOT DETECTED NOT DETECTED Final   Acinetobacter baumannii NOT DETECTED NOT DETECTED Final   Enterobacteriaceae species DETECTED (A) NOT DETECTED Final    Comment: Enterobacteriaceae represent a large family of gram-negative bacteria, not a single organism. CRITICAL RESULT CALLED TO, READ BACK BY AND VERIFIED WITH: PJARMD J LEGGE [id number] 2032 MLM    Enterobacter cloacae complex NOT DETECTED NOT DETECTED Final   Escherichia coli DETECTED (A) NOT DETECTED Final    Comment: CRITICAL RESULT CALLED TO, READ BACK BY AND VERIFIED WITH: PHARMD J LEGGE [id number] 2032 MLM    Klebsiella oxytoca NOT DETECTED NOT DETECTED Final   Klebsiella pneumoniae NOT DETECTED NOT DETECTED Final   Proteus species NOT DETECTED NOT DETECTED Final    Serratia marcescens NOT DETECTED NOT DETECTED Final   Carbapenem resistance NOT DETECTED NOT DETECTED Final   Haemophilus influenzae NOT DETECTED NOT DETECTED Final   Neisseria meningitidis NOT DETECTED NOT DETECTED Final   Pseudomonas aeruginosa NOT DETECTED NOT DETECTED Final   Candida albicans NOT DETECTED NOT DETECTED Final   Candida glabrata NOT DETECTED NOT DETECTED Final   Candida krusei NOT DETECTED NOT DETECTED Final   Candida parapsilosis NOT DETECTED NOT DETECTED Final   Candida tropicalis NOT DETECTED NOT DETECTED Final  Comment: Performed at [facility], [place] [address], [place], [place] [address]         Radiology Studies: No results found.      Scheduled Meds: . DULoxetine  60 mg Oral QPM  . [START ON 10/30/2017] enoxaparin (LOVENOX) injection  60 mg Subcutaneous Q12H  . methadone  10 mg Oral Q8H  . multivitamin with minerals  1 tablet Oral Daily  . potassium chloride  10 mEq Oral BID   Continuous Infusions: . sodium chloride 75 mL/hr at 10/28/17 2313  . cefTRIAXone (ROCEPHIN)  IV Stopped (10/28/17 1830)     LOS: 2 days     Georgette Shell, MD [facility]  If 7PM-7AM, please contact night-coverage [email or web address] Password [facility] 10/29/2017, 12:16 PM

## 2017-10-29 NOTE — Progress Notes (Signed)
Palliative care progress note  Reason for consult: Pain management for metastatic disease  I met this morning with Carolyn Barton and her husband.  She reports that her pain has been much better controlled since midday yesterday.  She does ask what the plan will be for pain management as her morning methadone dose was held this morning.  Discussed with bedside staff and it appears that morning dose of methadone was held due to sedation.  During my encounter Carolyn Barton does continue to fall asleep intermittently.  Reviewed her methadone usage over the last 2 days.  On 1/29 she received 10 mg, 12.5 mg, and 12.5 mg.  Yesterday, she received 12.5 mg for 2 doses and the evening dose was held per my order.  I was able to call and touch base with her outpatient pain management office (she follows with Simeon Craft at [facility]).  Recommendation was to work to get her back on her home regimen of methadone 10 mg 3 times daily as she had been on this consistently for several months with good results.  In talking with patient, reviewing her PMP, discussing with her husband, and talking with her outpatient pain management clinic, it appears that she had been taking medication as prescribed in the past but this changed over the last month or so.  Her husband is now aware of the need to help her manage and monitor this moving forward.  As she appears sleepy this morning, we will plan to hold her methadone for 1 more dose and restart with 10 mg 3 times daily beginning this evening.  Continue use of MSIR as needed for breakthrough pain.  She is scheduled to have IR evaluate potentially draining pelvic cyst while inpatient.  Discussed with Dr. Rodena Piety and Dr. Benay Spice.  Total time: 45 minutes Greater than 50%  of this time was spent counseling and coordinating care related to the above assessment and plan.  Micheline Rough, MD [facility] [phone]

## 2017-10-29 NOTE — Progress Notes (Signed)
[facility] for enoxaparin >> heparin IV Indication: pulmonary embolus  No Known Allergies  Patient Measurements: Height: 5\' 5"  (165.1 cm) Weight: 141 lb 5 oz (64.1 kg) IBW/kg (Calculated) : 57 Heparin Dosing Weight: TBW  Vital Signs: Temp: 98.2 F (36.8 C) (01/31 1315) Temp Source: Oral (01/31 1315) BP: 110/72 (01/31 1315) Pulse Rate: 104 (01/31 1315)  Labs: Recent Labs    10/26/17 2336 10/27/17 0644 10/29/17 0352 10/29/17 0632  HGB 12.2 10.7*  --  9.9*  HCT 36.8 31.5*  --  29.4*  PLT 286 197  --  73*  CREATININE 0.75 0.83 QUESTIONABLE RESULTS, RECOMMEND RECOLLECT TO VERIFY 0.76    Estimated Creatinine Clearance: 67.3 mL/min (by C-G formula based on SCr of 0.76 mg/dL).  Medications:  Medications Prior to Admission  Medication Sig Dispense Refill Last Dose  . DULoxetine (CYMBALTA) 60 MG capsule Take 1 capsule (60 mg total) by mouth every evening. 30 capsule 3 10/26/2017 at Unknown time  . enoxaparin (LOVENOX) 60 MG/0.6ML injection Inject 0.6 mLs (60 mg total) into the skin every 12 (twelve) hours for 9 doses.   10/26/2017 at Unknown time  . fluticasone (CUTIVATE) 0.05 % cream Apply topically 2 (two) times daily. 30 g 1 10/26/2017 at Unknown time  . HYDROmorphone (DILAUDID) 4 MG tablet Take 4 mg by mouth every 4 (four) hours as needed for severe pain.    10/26/2017 at Unknown time  . lidocaine-prilocaine (EMLA) cream Apply to port site one hour prior to use. Do not rub in. Cover with plastic. 30 g 2 Past Month at Unknown time  . Meth-Hyo-M Bl-Na Phos-Ph Sal (URIBEL) 118 MG CAPS Take 1 capsule (118 mg total) by mouth 3 times/day as needed-between meals & bedtime. 120 capsule 3 Past Month at Unknown time  . methadone (DOLOPHINE) 10 MG tablet Take 10 mg by mouth 3 (three) times daily.    10/26/2017 at Unknown time  . minocycline (MINOCIN) 100 MG capsule Take 1 capsule (100 mg total) by mouth 2 (two) times daily. 60 capsule 11 10/26/2017 at Unknown  time  . morphine (MSIR) 15 MG tablet Take 1-2 tablets (15-30 mg total) by mouth every 4 (four) hours as needed for severe pain. 120 tablet 0 10/26/2017 at Unknown time  . omeprazole (PRILOSEC) 20 MG capsule Take 1 capsule (20 mg total) by mouth daily. (Patient taking differently: Take 20 mg by mouth daily as needed (reflux). ) 30 capsule 5 Past Week at Unknown time  . ondansetron (ZOFRAN) 8 MG tablet Take 1 tablet (8 mg total) by mouth every 8 (eight) hours as needed for nausea or vomiting. 20 tablet 1 10/26/2017 at Unknown time  . polyethylene glycol (MIRALAX / GLYCOLAX) packet Take 17 g by mouth daily as needed for mild constipation.    Past Week at Unknown time  . potassium chloride (K-DUR) 10 MEQ tablet Take 1 tablet (10 mEq total) by mouth 2 (two) times daily. 60 tablet 1 10/26/2017 at Unknown time  . prochlorperazine (COMPAZINE) 10 MG tablet Take 1 tablet (10 mg total) by mouth every 6 (six) hours as needed for nausea or vomiting. 30 tablet 1 Past Week at Unknown time  . sorbitol 70 % solution Take 30 cc twice today, then 30 cc daily 473 mL 0 10/26/2017 at Unknown time  . ferrous sulfate 325 (65 FE) MG EC tablet Take 1 tablet (325 mg total) by mouth daily. (Patient not taking: Reported on 10/27/2017)  0 Not Taking at Unknown time  .  ketorolac (TORADOL) 10 MG tablet Take 1 tablet (10 mg total) by mouth every 8 (eight) hours. for pain (Patient not taking: Reported on 10/27/2017) 6 tablet 0 Not Taking at Unknown time  . phenazopyridine (PYRIDIUM) 200 MG tablet Take 1 tablet (200 mg total) by mouth 3 (three) times daily as needed for pain. (Patient not taking: Reported on 10/22/2017) 30 tablet 0 Completed Course at Unknown time   Scheduled:  . DULoxetine  60 mg Oral QPM  . [START ON 10/30/2017] enoxaparin (LOVENOX) injection  60 mg Subcutaneous Q12H  . methadone  10 mg Oral Q8H  . multivitamin with minerals  1 tablet Oral Daily  . 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 On enoxaparin 1 mg/kg q12hr PTA which was continued by pharmacy on admission. Now scheduled for IR drainage of cystic pelvic mass on 2/1 by IR and pharmacy consulted to bridge with UFH.   Baseline INR, aPTT: not done (already on LMWH)  Prior anticoagulation: Lovenox 60 mg q12 hr, LD 1/31 @ 1034  Significant events:  Today, 10/29/2017:  CBC: Hgb lower, likely d/t FOLFIRI; Plt significantly lower (also likely d/t recent chemo. No Hx allergy to heparin products  No bleeding or infusion issues per nursing  CrCl: 67 ml/min  Goal of Therapy: Heparin level 0.3-0.7 units/ml Monitor platelets by anticoagulation protocol: Yes  Plan:  No bolus  Heparin 1100 units/hr (18 units/kg/hr) IV infusion, to begin at 2200 tonight (12 hr after Lovenox)  Check heparin level 6 hrs after start  Daily CBC, daily heparin level once stable  Monitor for signs of bleeding or thrombosis   Reuel Boom, PharmD Pager: [phone] 10/29/2017, 1:22 PM

## 2017-10-29 NOTE — Progress Notes (Signed)
Referring Physician(s): Sherrill,B  Supervising Physician: Corrie Mckusick  Patient Status:  [facility] - In-pt  Chief Complaint:  Metastatic colon cancer, back and left leg pain  Subjective: Patient familiar to IR service from prior Port-A-Cath placement on 04/10/16.  She has a history of metastatic colon cancer with prior surgery and chemotherapy.  He was recently diagnosed with pulmonary emboli as well as DVT of the left common femoral vein, currently on Lovenox.  Admitted recently with E. coli bacteremia and increasing pelvic and left leg/back pain secondary to progressive cystic mass causing obstruction and edema of left lower extremity with possible sacral involvement.  She was originally scheduled for outpatient drainage of the cystic mass prior to this admission.  Request now received for inpatient aspiration of the pelvic cystic mass.   Allergies: Patient has no known allergies.  Medications: Prior to Admission medications   Medication Sig Start Date End Date Taking? Authorizing Provider  DULoxetine (CYMBALTA) 60 MG capsule Take 1 capsule (60 mg total) by mouth every evening. 06/15/17  Yes Ladell Pier, MD  enoxaparin (LOVENOX) 60 MG/0.6ML injection Inject 0.6 mLs (60 mg total) into the skin every 12 (twelve) hours for 9 doses. 10/24/17 10/29/17 Yes Samuella Cota, MD  fluticasone (CUTIVATE) 0.05 % cream Apply topically 2 (two) times daily. 11/17/16  Yes Ladell Pier, MD  HYDROmorphone (DILAUDID) 4 MG tablet Take 4 mg by mouth every 4 (four) hours as needed for severe pain.    Yes [provider]  lidocaine-prilocaine (EMLA) cream Apply to port site one hour prior to use. Do not rub in. Cover with plastic. 03/02/17  Yes Owens Shark, NP  Meth-Hyo-M Barnett Hatter Phos-Ph Sal (URIBEL) 118 MG CAPS Take 1 capsule (118 mg total) by mouth 3 times/day as needed-between meals & bedtime. 03/16/17  Yes Carolan Clines, MD  methadone (DOLOPHINE) 10 MG tablet Take 10 mg by mouth 3  (three) times daily.    Yes [provider]  minocycline (MINOCIN) 100 MG capsule Take 1 capsule (100 mg total) by mouth 2 (two) times daily. 12/08/16  Yes Ladell Pier, MD  morphine (MSIR) 15 MG tablet Take 1-2 tablets (15-30 mg total) by mouth every 4 (four) hours as needed for severe pain. 10/21/17  Yes Ladell Pier, MD  omeprazole (PRILOSEC) 20 MG capsule Take 1 capsule (20 mg total) by mouth daily. Patient taking differently: Take 20 mg by mouth daily as needed (reflux).  10/21/17  Yes Ladell Pier, MD  ondansetron (ZOFRAN) 8 MG tablet Take 1 tablet (8 mg total) by mouth every 8 (eight) hours as needed for nausea or vomiting. 04/24/17  Yes Owens Shark, NP  polyethylene glycol (MIRALAX / GLYCOLAX) packet Take 17 g by mouth daily as needed for mild constipation.    Yes [provider]  potassium chloride (K-DUR) 10 MEQ tablet Take 1 tablet (10 mEq total) by mouth 2 (two) times daily. 09/21/17  Yes Ladell Pier, MD  prochlorperazine (COMPAZINE) 10 MG tablet Take 1 tablet (10 mg total) by mouth every 6 (six) hours as needed for nausea or vomiting. 09/15/16  Yes Owens Shark, NP  sorbitol 70 % solution Take 30 cc twice today, then 30 cc daily 10/19/17  Yes Owens Shark, NP  ferrous sulfate 325 (65 FE) MG EC tablet Take 1 tablet (325 mg total) by mouth daily. Patient not taking: Reported on 10/27/2017 11/17/16   Ladell Pier, MD  ketorolac (TORADOL) 10 MG tablet  Take 1 tablet (10 mg total) by mouth every 8 (eight) hours. for pain Patient not taking: Reported on 10/27/2017 10/12/17   Ladell Pier, MD  phenazopyridine (PYRIDIUM) 200 MG tablet Take 1 tablet (200 mg total) by mouth 3 (three) times daily as needed for pain. Patient not taking: Reported on 10/22/2017 10/13/17 10/13/18  Ceasar Mons, MD     Vital Signs: BP 114/77 (BP Location: Right Arm)   Pulse (!) 109   Temp 98 F (36.7 C) (Oral)   Resp 18   Ht 5\' 5"  (1.651 m)   Wt 141 lb 5 oz  (64.1 kg)   SpO2 100%   BMI 23.52 kg/m   Physical Exam awake, alert.  Chest clear to auscultation bilaterally.  Clean, intact right chest wall Port-A-Cath.  Heart with tachycardic but regular rhythm.  Abdomen soft, left lower quadrant colostomy, mild to moderate pelvic tenderness to palpation; left greater than right lower extremity edema  Imaging: Dg Abdomen 1 View  Result Date: 10/27/2017 CLINICAL DATA:  Ureteral stents. Minimal urine output. Abdominal pain. EXAM: ABDOMEN - 1 VIEW COMPARISON:  Fluoroscopy 10/13/2017. CT abdomen and pelvis 10/11/2016 FINDINGS: Bilateral ureteral stents are in place without change in position since the previous fluoroscopic images. No radiopaque stones are identified. Left lower quadrant colostomy. Scattered gas and stool throughout the colon. No small or large bowel distention. Degenerative changes and postoperative changes in the lumbar spine. Lumbar scoliosis convex towards the left. Degenerative changes in the hips. IMPRESSION: Bilateral ureteral stents appear unchanged in position since previous study. Stool-filled colon. No evidence of bowel obstruction. Electronically Signed   By: Lucienne Capers M.D.   On: 10/27/2017 02:37   Ct Venogram Abd/pel  Result Date: 10/27/2017 CLINICAL DATA:  62 year old female with a history of pulmonary embolism EXAM: CTA ABDOMEN AND PELVIS WITH CONTRAST TECHNIQUE: Multidetector CT imaging of the abdomen and pelvis was performed using the standard protocol during bolus administration of intravenous contrast. Multiplanar reconstructed images and MIPs were obtained and reviewed to evaluate the vascular anatomy. CONTRAST:  190mL ISOVUE-370 IOPAMIDOL (ISOVUE-370) INJECTION 76% COMPARISON:  CT a chest 10/22/2017, abdominal CT 10/11/2017, 10/08/2017, 06/19/2017, 12/25/2016 FINDINGS: VASCULAR Aorta: Mild atherosclerotic changes of the aorta with no significant calcifications. No aneurysm or dissection. Celiac: Celiac artery patent with no  greater than 50% narrowing at the origin. SMA: Superior mesenteric artery patent with no significant atherosclerotic changes at the origin. Renals: Bilateral renal arteries are patent with no atherosclerotic changes at the origin. IMA: Inferior mesenteric artery patent. Right lower extremity: Minimal atherosclerotic changes of the right iliac system. Right iliac arterial system and proximal femoral arteries patent. Left lower extremity: Minimal atherosclerotic changes of the left iliac system. Left iliac system and the proximal left femoral arteries are patent. Veins: Unremarkable appearance of the portal system, with patency of the splenic vein, portal spleno confluence, and the portal veins. Superior mesenteric vein and inferior mesenteric vein are patent. The IVC is patent at the liver, juxtarenal IVC, and infrarenal IVC. There is compression and attenuation of the left common iliac vein at typical location of May-Thurner anatomy. Left pelvic iliac veins are partially opacified. The left external iliac vein is compressed by the pelvic mass with no appreciable contrast opacification. The left common femoral vein demonstrates nonocclusive thrombus. The right common iliac vein and external iliac vein are patent. No filling defects identified within the right-sided iliac venous system or the right common femoral vein. Collateral venous drainage from left pelvis to right pelvis within  the superficial/body wall veins. Review of the MIP images confirms the above findings. NON-VASCULAR Lower chest: No acute. Hiatal hernia with retained material within the thoracic esophagus and stomach above the hiatus. Hepatobiliary: Unremarkable appearance of liver with no focal lesion identified. Unremarkable gallbladder with no cholelithiasis. No intrahepatic or extrahepatic biliary ductal dilatation. Pancreas: Unremarkable pancreas. Spleen: Unremarkable spleen Adrenals/Urinary Tract: Unremarkable adrenal glands. Right: Double-J  ureteral stent within the right ureter, with the proximal loop formed in the hilum and the distal loop formed within decompressed urinary bladder. Since the CT dated 10/08/2017, there has been development of differential attenuation of the right-sided kidney parenchyma, with a clear demarcation of decreased perfusion/edema of the inferior cortex of the right kidney. There is mild dilation of the inferior pole moiety collecting system, with mild dilation of the proximal inferior pole ureter, which is lateral to the ureteral stent. The site of connection of the partially duplicated right-sided ureters not identified on the study. Left: Double-J ureteral stent on the left, with the proximal loop formed within the upper pole moiety collecting system and the distal loop formed in the decompressed distal ureter. Since the prior CT 10/08/2017, there is continued differential attenuation of the left-sided kidney parenchyma, with clear demarcation of decreased perfusion/edema of the superior pole moiety of the left kidney. There is mild dilation of the inferior pole moiety collecting system as well as mild dilation of the inferior pole ureter. The site at which the partially duplicated ureters join is not identified on the current study. Urinary bladder decompressed. Catheter within the urinary bladder at the anterior pelvis, displaced by the pelvic mass. Stomach/Bowel: Hiatal hernia. Retained debris/material within the thoracic esophagus and stomach. No significant dilation of small bowel. Ostomy of the left abdomen. Moderate degree of formed stool within the colon. Lymphatic: Since the prior CT there has been development of body wall anasarca/edema, as well as small volume ascites and edema throughout the abdominal mesentery/fat. Again, small lymph nodes in the periaortic/preaortic nodal stations. Reproductive: Redemonstration of partially cystic and solid right ovarian mass/pelvic mass. The pelvic mass contributes to mass  effect on the adjacent urinary bladder, bilateral ureters, and the vasculature, as above. Greatest diameter on the current CT measures 17 cm, which is increased from the comparison CT, potentially secondary to differences in technique, or actual growth. Other: Edema of the proximal left leg Musculoskeletal: Accentuated kyphotic curvature. No displaced fracture. Surgical changes of the lumbar spine. IMPRESSION: The CT venogram study is positive for nonocclusive DVT of the left common femoral vein. There is compression or potentially occlusion of the left iliac venous system secondary to mass effect from the pelvic mass. Associated collateral venous drainage in the body wall, with proximal left leg edema. Since the prior CT, there is now differential perfusion/edema of the bilateral kidneys, new on the right and similar to the left. The right kidney is affected at the inferior pole and the left kidney at the superior pole. The edema is favored to be secondary to at least partial obstruction of the right inferior pole moiety and left superior pole moiety. On the left, this would imply a blocked ureteral stent. Differential includes focal infection/pyelonephritis, or less likely focal infarction from either arterial or venous source. 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 The CT is nondiagnostic to identify whether there is complete duplication on either/both the left or the right. Single bilateral double-J ureteral stents. Both stents appear to drain the upper pole moiety. Increasing body wall  and mesenteric edema. Recommend correlation with fluid status. Developing small volume ascites. 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 Diameter measures 17 cm on axial images on today's study, increased from prior, which may be either actual growth or differences in technique/measurement. Again, pelvic mass contributes to compression of  the left iliac vasculature and the bilateral ureters. Surgical changes of colon ostomy. Electronically Signed   By: Corrie Mckusick D.O.   On: 10/27/2017 11:28    Labs:  CBC: Recent Labs    10/24/17 0419 10/26/17 2336 10/27/17 0644 10/29/17 0632  WBC 10.2 1.2* 13.8* 18.8*  HGB 11.6* 12.2 10.7* 9.9*  HCT 35.2* 36.8 31.5* 29.4*  PLT 669* 286 197 73*    COAGS: No results for input(s): INR, APTT in the last 8760 hours.  BMP: Recent Labs    10/26/17 2336 10/27/17 0644 10/29/17 0352 10/29/17 0632  NA 134* 132* QUESTIONABLE RESULTS, RECOMMEND RECOLLECT TO VERIFY 133*  K 4.5 3.1* QUESTIONABLE RESULTS, RECOMMEND RECOLLECT TO VERIFY 3.5  CL 99* 101 QUESTIONABLE RESULTS, RECOMMEND RECOLLECT TO VERIFY 106  CO2 23 23 QUESTIONABLE RESULTS, RECOMMEND RECOLLECT TO VERIFY 21*  GLUCOSE 139* 114* QUESTIONABLE RESULTS, RECOMMEND RECOLLECT TO VERIFY 63*  BUN 18 15 QUESTIONABLE RESULTS, RECOMMEND RECOLLECT TO VERIFY 16  CALCIUM 7.8* 7.3* QUESTIONABLE RESULTS, RECOMMEND RECOLLECT TO VERIFY 7.4*  CREATININE 0.75 0.83 QUESTIONABLE RESULTS, RECOMMEND RECOLLECT TO VERIFY 0.76  GFRNONAA >60 >60 NOT CALCULATED >60  GFRAA >60 >60 NOT CALCULATED >60    LIVER FUNCTION TESTS: Recent Labs    08/31/17 0959 09/21/17 0746 10/12/17 0957 10/21/17 0739  BILITOT 0.38 0.41 0.7 0.5  AST 21 19 21 14   ALT 15 12 14 11   ALKPHOS 118 105 130 107  PROT 6.3* 6.2* 6.5 7.0  ALBUMIN 3.3* 3.2* 3.3* 2.9*    Assessment and Plan: Pt with history of metastatic colon cancer with prior surgery and chemotherapy.  She was recently diagnosed with pulmonary emboli as well as DVT of the left common femoral vein, currently on Lovenox.  Admitted recently with E. coli bacteremia and increasing pelvic and left leg/back pain secondary to progressive cystic mass causing obstruction and edema of left lower extremity with possible sacral involvement.  She was originally scheduled for outpatient drainage of the cystic mass prior to this  admission.  Request now received for inpatient aspiration of the pelvic cystic mass for culture/cytology.  Imaging studies have been reviewed by Dr. Annamaria Boots.  Risks and benefits discussed with the patient/spouse including, but not limited to bleeding, infection, damage to adjacent structures or low yield requiring additional tests.All of the patient's questions were answered, patient is agreeable to proceed.Consent signed and in chart. Patient received Lovenox last night therefore will do procedure on 2/1.     Electronically Signed: D. Rowe Robert, PA-C 10/29/2017, 11:09 AM   I spent a total of 25 minutes at the the patient's bedside AND on the patient's hospital floor or unit, greater than 50% of which was counseling/coordinating care for CT-guided aspiration of pelvic cystic mass    Patient ID: Carolyn Barton, female   DOB: September 15, 1956, 62 y.o.   MRN: [id number]

## 2017-10-29 NOTE — Progress Notes (Signed)
IP PROGRESS NOTE  Subjective:   Carolyn Barton reports improvement in the leg edema.  Her pain is adequately controlled at present.  Objective: Vital signs in last 24 hours: Blood pressure 110/72, pulse (!) 104, temperature 98.2 F (36.8 C), temperature source Oral, resp. rate 16, height '5\' 5"'$  (1.651 m), weight 141 lb 5 oz (64.1 kg), SpO2 100 %.  Intake/Output from previous day: 01/30 0701 - 01/31 0700 In: 2460 [P.O.:480; I.V.:1880; IV Piggyback:100] Out: 2200 [Urine:2200]  Physical Exam:  HEENT: No thrush  Extremities: Edema throughout the leg  Portacath/PICC-without erythema  Lab Results: Recent Labs    10/27/17 0644 10/29/17 0632  WBC 13.8* 18.8*  HGB 10.7* 9.9*  HCT 31.5* 29.4*  PLT 197 73*    BMET Recent Labs    10/29/17 0352 10/29/17 0632  NA QUESTIONABLE RESULTS, RECOMMEND RECOLLECT TO VERIFY 133*  K QUESTIONABLE RESULTS, RECOMMEND RECOLLECT TO VERIFY 3.5  CL QUESTIONABLE RESULTS, RECOMMEND RECOLLECT TO VERIFY 106  CO2 QUESTIONABLE RESULTS, RECOMMEND RECOLLECT TO VERIFY 21*  GLUCOSE QUESTIONABLE RESULTS, RECOMMEND RECOLLECT TO VERIFY 63*  BUN QUESTIONABLE RESULTS, RECOMMEND RECOLLECT TO VERIFY 16  CREATININE QUESTIONABLE RESULTS, RECOMMEND RECOLLECT TO VERIFY 0.76  CALCIUM QUESTIONABLE RESULTS, RECOMMEND RECOLLECT TO VERIFY 7.4*    Lab Results  Component Value Date   CEA1 1.32 10/12/2017    Studies/Results: No results found.  Medications: I have reviewed the patient's current medications.  Assessment/Plan:  1.Metastatic colorectal cancer-status post an exploratory laparotomy 03/03/2016 revealing a proximal rectal mass, peritoneal carcinomatosis, and liver metastases  Biopsy of peritoneal nodules 03/03/2016 confirmed metastatic adenocarcinoma consistent with a colon primary  Foundation 1 testing-MSI-stable, tumor mutation burden-low, no BRAF NRAS or KRAS mutation  Cycle 1 FOLFIRI/PANITUMUMAB 04/14/2016  Cycle 2 FOLFIRI/panitumumab  04/28/2016  Cycle 3 FOLFIRI/panitumumab 05/12/2016  Cycle 4 FOLFIRI/panitumumab 05/27/2016  Cycle 5 FOLFIRI/panitumumab 06/09/2016  Restaging CT abdomen/pelvis 06/20/2016-no evidence of disease progression, decreased left hepatic lesion  Cycle 6 FOLFIRI/panitumumab 06/23/2016  Cycle 7 FOLFIRI/panitumumab 07/07/2016  Cycle 8 FOLFIRI/panitumumab 07/21/2016  Cycle 9 FOLFIRI/PANITUMUMAB 08/04/2016  Cycle 10 FOLFIRI/panitumumab 08/18/2016  Restaging CT 08/29/2016 with interval decrease in the size of the medial segment left liver lesion. Lesion identified previously in the rectosigmoid colon not evident on the current study.  5-FU/PANITUMUMAB 09/01/2016  Xeloda 7 days on/7 days off and PANITUMUMAB every 3 weeks 09/15/2016 (awaiting insurance approval for Xeloda 09/15/2016)  CT abdomen/pelvis 12/25/2016-unchanged 5 mm right hepatic lesion, resolution of medial segment left liver lesion, no new liver lesion. Residual soft tissue fullness in the sigmoid , persistent distal esophageal wall thickening  Continuationof PANITUMUMAB every 3 weeks and Xeloda 7 days on/7 days off  CT 06/19/2017-new cystic/solid lesion in the left adnexa  Xeloda/panitumumab continued  CT 10/08/2016-progression of cystic pelvic mass  Cycle 1 FOLFIRI/Panitumumab 10/21/2017  2.History of aBowel obstruction secondary to #1  3. Chronic back pain  4. Essential thrombocytosis  5. Early obstruction of the left ureter noted at the time of surgery 03/03/2016-Dr. Tannenbaum placed a left double-J stent 03/18/2016  New onset right hydronephrosis 10/11/2017  Right ureter stent placement 10/13/2017  6. Abdominal wall cellulitis 03/10/2016, blood cultures positive for coagulase negative staphylococcus-methicillin-resistant, status post treatment with vancomycin and Bactrim   8. Port-A-Cath placement 04/10/2016, interventional radiology  9. Iron deficiency anemia. Feraheme 11/05/2015 ,  11/12/2015,and 04/14/2016-persistent anemia and red cell microcytosis, oral iron resumed 11/17/2016-stable  10. Pain/tenderness left posterior iliac region-resolved  11. Hypokalemia-started on potassium replacement 06/09/2016  12.  History of skin rashand paronychiasecondary to [facility], she continues  minocycline, moisturizers, and fluticasone as needed  13.Acute onset right low back pain 10/11/2017-likely secondary to obstruction of the right kidney;left ureter stent exchange and right ureter stent placement 10/13/2017  14.Left leg pain and edema-bilateral lower extremity venous Doppler negative for DVT 10/13/2017 and 10/19/2017. I suspect she has a pelvic DVT  15.  Admission 10/22/2017 with bilateral pulmonary embolism, heparin drip initiated, converted to Lovenox 10/23/2017  16.  Admission 10/27/2017 with increased back/leg pain and a fever-blood and urine cultures positive for a resistant E. coli   Carolyn Barton appears stable.  She continues IV antibiotics for treatment of E. coli bacteremia, likely from a urinary source. The pain is under adequate control with methadone and MSIR.  She is maintained on heparin anticoagulation for pulmonary embolism.  She can be converted to Xarelto at discharge.  The thrombocytopenia is likely related to chemotherapy.  We will check a platelet count 10/30/2017.  Discussed the case with interventional radiology today.  She will be scheduled for aspiration of the cystic pelvic mass tomorrow.  Recommendations: 1.  Continue antibiotics for the E. coli bacteremia 2.  Heparin for treatment of the DVT/pulmonary embolism, convert to Xarelto at discharge 3.  Narcotic analgesics for pain- I appreciate up from the palliative care service 4.  Outpatient follow-up is scheduled at the Cancer center for 11/02/2017 5.  Check platelet count 10/30/2017      LOS: 2 days   [name] Coder, MD   10/29/2017, 5:40 PM

## 2017-10-29 NOTE — Progress Notes (Signed)
[facility] Spiritual Care Note  Carolyn Barton is so much more comfortable today! Many thanks to the team for working on pain mgmt! Carolyn Barton, husband Purcell Nails, and SIL Morey Hummingbird are all grateful. [name] reports emotional relief, too, with the anticipation of IR's help to ease abdominal pressure tomorrow. Family's outlook is considerably more upbeat today. Feeling supported through the scary painful time has been very helpful and meaningful.   [name] plans to call me next week to schedule Spiritual Care appt at [facility], but please also page if immediate needs arise or circumstances change. Thank you.   [place], [place], [facility] M-F daytime pager [phone] [facility] 24/7 pager [phone] Voicemail [phone]

## 2017-10-29 NOTE — Plan of Care (Signed)
  Pain Managment: General experience of comfort will improve 10/29/2017 0121 - Progressing by Charleene Callegari, Sherryll Burger, RN

## 2017-10-29 NOTE — Plan of Care (Signed)
  Pain Managment: General experience of comfort will improve 10/29/2017 2139 - Progressing by Ashley Murrain, RN

## 2017-10-30 ENCOUNTER — Encounter (HOSPITAL_COMMUNITY): Payer: Self-pay | Admitting: Radiology

## 2017-10-30 ENCOUNTER — Inpatient Hospital Stay (HOSPITAL_COMMUNITY): Payer: BLUE CROSS/BLUE SHIELD

## 2017-10-30 LAB — BASIC METABOLIC PANEL
Anion gap: 6 (ref 5–15)
BUN: 11 mg/dL (ref 6–20)
CHLORIDE: 108 mmol/L (ref 101–111)
CO2: 23 mmol/L (ref 22–32)
CREATININE: 0.58 mg/dL (ref 0.44–1.00)
Calcium: 7.6 mg/dL — ABNORMAL LOW (ref 8.9–10.3)
GFR calc non Af Amer: >60 mL/min (ref >60)
Glucose, Bld: 77 mg/dL (ref 65–99)
Potassium: 3.6 mmol/L (ref 3.5–5.1)
Sodium: 137 mmol/L (ref 135–145)

## 2017-10-30 LAB — CULTURE, BLOOD (ROUTINE X 2): Special Requests: ADEQUATE

## 2017-10-30 LAB — HEPARIN LEVEL (UNFRACTIONATED)
Heparin Unfractionated: 0.3 IU/mL (ref 0.30–0.70)
Heparin Unfractionated: 0.19 IU/mL — ABNORMAL LOW (ref 0.30–0.70)

## 2017-10-30 LAB — PROTIME-INR
INR: 0.97
Prothrombin Time: 12.7 seconds (ref 11.4–15.2)

## 2017-10-30 LAB — CBC
HEMATOCRIT: 31.1 % — AB (ref 36.0–46.0)
HEMOGLOBIN: 10.3 g/dL — AB (ref 12.0–15.0)
MCH: 28.8 pg (ref 26.0–34.0)
MCHC: 33.1 g/dL (ref 30.0–36.0)
MCV: 86.9 fL (ref 78.0–100.0)
Platelets: 81 10*3/uL — ABNORMAL LOW (ref 150–400)
RBC: 3.58 MIL/uL — ABNORMAL LOW (ref 3.87–5.11)
RDW: 16.3 % — ABNORMAL HIGH (ref 11.5–15.5)
WBC: 13.8 10*3/uL — ABNORMAL HIGH (ref 4.0–10.5)

## 2017-10-30 MED ORDER — HEPARIN (PORCINE) IN NACL 100-0.45 UNIT/ML-% IJ SOLN: 1200.0000 [IU]/h | INTRAMUSCULAR | Status: DC

## 2017-10-30 MED ORDER — MIDAZOLAM HCL 2 MG/2ML IJ SOLN
INTRAMUSCULAR | Status: AC
  Filled 2017-10-30: qty 2

## 2017-10-30 MED ORDER — HEPARIN (PORCINE) IN NACL 100-0.45 UNIT/ML-% IJ SOLN
1300.0000 [IU]/h | INTRAMUSCULAR | Status: DC
  Administered 2017-10-30: 1300 [IU]/h via INTRAVENOUS
  Filled 2017-10-30: qty 250

## 2017-10-30 MED ORDER — LIDOCAINE HCL (PF) 1 % IJ SOLN
INTRAMUSCULAR | Status: AC | PRN
  Administered 2017-10-30: 30 mL

## 2017-10-30 MED ORDER — FENTANYL CITRATE (PF) 100 MCG/2ML IJ SOLN
INTRAMUSCULAR | Status: AC
  Filled 2017-10-30: qty 2

## 2017-10-30 MED ORDER — FENTANYL CITRATE (PF) 100 MCG/2ML IJ SOLN
INTRAMUSCULAR | Status: AC | PRN
  Administered 2017-10-30: 50 ug via INTRAVENOUS

## 2017-10-30 MED ORDER — HYDROMORPHONE HCL 1 MG/ML IJ SOLN: 1.0000 mg | INTRAMUSCULAR | Status: DC | PRN

## 2017-10-30 MED ORDER — MIDAZOLAM HCL 2 MG/2ML IJ SOLN
INTRAMUSCULAR | Status: AC | PRN
  Administered 2017-10-30: 1 mg via INTRAVENOUS

## 2017-10-30 NOTE — Progress Notes (Signed)
[place] for enoxaparin >> heparin IV Indication: pulmonary embolus  No Known Allergies  Patient Measurements: Height: 5\' 5"  (165.1 cm) Weight: 141 lb 5 oz (64.1 kg) IBW/kg (Calculated) : 57 Heparin Dosing Weight: TBW  Vital Signs: Temp: 98.3 F (36.8 C) (02/01 2041) Temp Source: Oral (02/01 2041) BP: 112/71 (02/01 2041) Pulse Rate: 113 (02/01 2041)  Labs: Recent Labs    10/29/17 0352 10/29/17 2979 10/30/17 0259 10/30/17 0748 10/30/17 2035  HGB  --  9.9* 10.3*  --   --   HCT  --  29.4* 31.1*  --   --   PLT  --  73* 81*  --   --   LABPROT  --   --  12.7  --   --   INR  --   --  0.97  --   --   HEPARINUNFRC  --   --   --  0.30 0.19*  CREATININE QUESTIONABLE RESULTS, RECOMMEND RECOLLECT TO VERIFY 0.76 0.58  --   --     Estimated Creatinine Clearance: 67.3 mL/min (by C-G formula based on SCr of 0.58 mg/dL). 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 On enoxaparin 1 mg/kg q12hr PTA which was continued by pharmacy on admission. Now s/p IR drainage of cystic pelvic mass and pharmacy consulted to bridge with UFH.   Baseline INR, aPTT: not done (already on LMWH)  Prior anticoagulation: Lovenox 60 mg q12 hr, LD 1/31 @ 1034  Significant events:  Heparin off at 1010 prior to 2/1 IR procedure  Heparin resume at 1530 at previous rate   Today, 10/30/2017:  Heparin level low 5 hrs after drip resumed with no bolus at 1200 units/hr - not steady state  No bleeding reported  Goal of Therapy: Heparin level 0.3-0.7 units/ml Monitor platelets by anticoagulation protocol: Yes   Plan:  Increase heparin to 1300 units/hr and check AM heparin level  Daily CBC, daily heparin level  Anticipate transition back to Lovenox once patient  stable without bleeding post-drainage, and then Xarelto at discharge, per Dr. Gearldine Shown recommendations  Monitor for signs of bleeding or thrombosis   Eudelia Bunch, Pharm.D. [phone] 10/30/2017 9:52 PM

## 2017-10-30 NOTE — Progress Notes (Signed)
Palliative care progress note  Reason for consult: Pain management for metastatic disease  I met this morning with Carolyn Barton and her husband.  She reports that her pain had been under much better control throughout the day yesterday but she had some recurrence of pain overnight.  At that time, she was given MSIR and reports that it was helpful in reducing her pain again.  She is concerned about the fact that she is going down for procedure later today and may have worsening pain.  Her husband reports the pain was so bad that she called him crying overnight.  For long-acting agent, will continue with home regimen of methadone 10 mg 3 times daily.  I think increased pain overnight was related to holding methadone (due to concern for sedation as dose was increased but then it became apparent that she had not been taking her medication at home as prescribed).  She is due to go for draining of pelvic cyst later this morning.  She is concerned about pain related to this.  We will therefore plan for addition of Dilaudid 1 mg every 2 hours as needed throughout the day today for breakthrough pain in addition to her currently ordered MSIR.  She understands that this is a temporary addition and I plan to wean her back off of IV medications tomorrow.  I do think with another 24-48 hours on current dose of methadone, her pain will be much better controlled overall.  She does have a follow-up appointment scheduled with her outpatient pain management clinic on February 13.  Total time: 30 minutes Greater than 50%  of this time was spent counseling and coordinating care related to the above assessment and plan.  Micheline Rough, MD [facility] [phone]

## 2017-10-30 NOTE — Progress Notes (Signed)
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  10/29/2017-she denies any nausea or vomiting today however her appetite is diminished.  Still have abdominal pain and distention.     Assessment & Plan:   Principal Problem:   Lower urinary tract infectious disease Active Problems:   Malnutrition of moderate degree   Rectal cancer (HCC)   Metastatic colorectal cancer (HCC)   Bilateral pulmonary embolism (HCC)   Tachycardia   Urinary retention   Chemotherapy-induced neutropenia (HCC)   UTI (urinary tract infection)   Left femoral vein DVT (HCC)   Sepsis (HCC)   Acute pyelonephritis  Sepsis from E. coli bacteremia, pyelonephritis Case was discussed with urology, they felt given bilateral ureteral  Stents in place on CT venogram, no intervention needed at this time.leukocytosis worse.monitor. -Stop cefepime, start ceftriaxone -Continue IV fluids  Complex cystic pelvic mass-significant interval enlargement from 5 x 4 cm to 13 x 13 cm.  Appears more cystic ovarian neoplasm than colon cancer.  Patient to go for IR drainage today.will bridge  With heparin.  Chronic abdominal pain -Continue methadone -per dr Domingo Cocking   Sinus tachycardia Chronic  Newly diagnosed bilateral pulmonary embolism Femoral DVT -Hold Lovenox.bridge with heparin.      DVT prophylaxis:heparin Code Status:full Family Communication:  none Disposition Plan:tbd Consultants:  Ir,palliative Procedures: none Antimicrobials:rocephin Subjective:had some pain left leg last nite   Objective: Vitals:   10/29/17 0549 10/29/17 1315 10/29/17 2119 10/30/17 0551  BP: 114/77 110/72 112/72 117/79  Pulse: (!) 109 (!) 104 (!) 101 99  Resp: 18 16 16 16   Temp: 98 F (36.7 C) 98.2 F (36.8 C) 98 F (36.7 C) 98 F (36.7 C)  TempSrc: Oral Oral Oral Oral  SpO2: 100% 100% 100% 100%  Weight:      Height:        Intake/Output Summary (Last 24 hours) at 10/30/2017 1146 Last data filed at 10/30/2017 1018 Gross per 24 hour  Intake 2434.31 ml  Output 1450 ml  Net 984.31 ml   Filed Weights   10/26/17 2138 10/27/17 0301  Weight: 62.6 kg (138 lb) 64.1 kg (141 lb 5 oz)    Examination:  General exam: Appears calm and comfortable  Respiratory system: Clear to auscultation. Respiratory effort normal. Cardiovascular system: S1 & S2 heard, RRR. No JVD, murmurs, rubs, gallops or clicks. No pedal edema. Gastrointestinal system: Abdomen is nondistended, soft and tender. No organomegaly or masses felt. Normal bowel sounds heard.colostomy with stool. Central nervous system: Alert and oriented. No focal neurological deficits. Extremities: Symmetric 5 x 5 power. Skin: No rashes, lesions or ulcers Psychiatry: Judgement and insight appear normal. Mood & affect appropriate.     Data Reviewed: I have personally reviewed following labs and imaging studies  CBC: Recent Labs  Lab 10/24/17 0419 10/26/17 2336 10/27/17 0644 10/29/17 0632 10/30/17 0259  WBC 10.2 1.2* 13.8* 18.8* 13.8*  NEUTROABS  --  1.0*  --   --   --   HGB 11.6* 12.2 10.7* 9.9* 10.3*  HCT 35.2* 36.8 31.5* 29.4* 31.1*  MCV 89.3  86.8 87.5 86.0 86.9  PLT 669* 286 197 73* 81*   Basic Metabolic Panel: Recent Labs  Lab 10/26/17 2336 10/27/17 0644 10/29/17 0352 10/29/17 0632 10/30/17 0259  NA 134* 132* QUESTIONABLE RESULTS, RECOMMEND RECOLLECT TO VERIFY 133* 137  K 4.5  3.1* QUESTIONABLE RESULTS, RECOMMEND RECOLLECT TO VERIFY 3.5 3.6  CL 99* 101 QUESTIONABLE RESULTS, RECOMMEND RECOLLECT TO VERIFY 106 108  CO2 23 23 QUESTIONABLE RESULTS, RECOMMEND RECOLLECT TO VERIFY 21* 23  GLUCOSE 139* 114* QUESTIONABLE RESULTS, RECOMMEND RECOLLECT TO VERIFY 63* 77  BUN 18 15 QUESTIONABLE RESULTS, RECOMMEND RECOLLECT TO VERIFY 16 11  CREATININE 0.75 0.83 QUESTIONABLE RESULTS, RECOMMEND RECOLLECT TO VERIFY 0.76 0.58  CALCIUM 7.8* 7.3* QUESTIONABLE RESULTS, RECOMMEND RECOLLECT TO VERIFY 7.4* 7.6*   GFR: Estimated Creatinine Clearance: 67.3 mL/min (by C-G formula based on SCr of 0.58 mg/dL). Liver Function Tests: No results for input(s): AST, ALT, ALKPHOS, BILITOT, PROT, ALBUMIN in the last 168 hours. No results for input(s): LIPASE, AMYLASE in the last 168 hours. No results for input(s): AMMONIA in the last 168 hours. Coagulation Profile: Recent Labs  Lab 10/30/17 0259  INR 0.97   Cardiac Enzymes: No results for input(s): CKTOTAL, CKMB, CKMBINDEX, TROPONINI in the last 168 hours. BNP (last 3 results) No results for input(s): PROBNP in the last 8760 hours. HbA1C: No results for input(s): HGBA1C in the last 72 hours. CBG: No results for input(s): GLUCAP in the last 168 hours. Lipid Profile: No results for input(s): CHOL, HDL, LDLCALC, TRIG, CHOLHDL, LDLDIRECT in the last 72 hours. Thyroid Function Tests: No results for input(s): TSH, T4TOTAL, FREET4, T3FREE, THYROIDAB in the last 72 hours. Anemia Panel: No results for input(s): VITAMINB12, FOLATE, FERRITIN, TIBC, IRON, RETICCTPCT in the last 72 hours. Sepsis Labs: Recent Labs  Lab 10/27/17 0142  LATICACIDVEN 1.50    Recent Results (from the past 240 hour(s))  Urine C&S     Status: Abnormal   Collection Time: 10/26/17 11:11 PM  Result Value Ref Range Status   Specimen Description URINE, CATHETERIZED  Final   Special Requests Immunocompromised  Final   Culture >=100,000 COLONIES/mL ESCHERICHIA COLI (A)   Final   Report Status 10/29/2017 FINAL  Final   Organism ID, Bacteria ESCHERICHIA COLI (A)  Final      Susceptibility   Escherichia coli - MIC*    AMPICILLIN >=32 RESISTANT Resistant     CEFAZOLIN <=4 SENSITIVE Sensitive     CEFTRIAXONE <=1 SENSITIVE Sensitive     CIPROFLOXACIN >=4 RESISTANT Resistant     GENTAMICIN <=1 SENSITIVE Sensitive     IMIPENEM <=0.25 SENSITIVE Sensitive     NITROFURANTOIN <=16 SENSITIVE Sensitive     TRIMETH/SULFA >=320 RESISTANT Resistant     AMPICILLIN/SULBACTAM 8 SENSITIVE Sensitive     PIP/TAZO <=4 SENSITIVE Sensitive     Extended ESBL NEGATIVE Sensitive     * >=100,000 COLONIES/mL ESCHERICHIA COLI  Blood Culture (routine x 2)     Status: Abnormal   Collection Time: 10/27/17  1:29 AM  Result Value Ref Range Status   Specimen Description   Final    BLOOD RIGHT HAND Performed at [facility] [address], [place], [place] [address]    Special Requests   Final    IN PEDIATRIC BOTTLE Blood Culture adequate volume Performed at [facility] [address] [place], [place] [address]    Culture  Setup Time   Final    GRAM NEGATIVE RODS IN PEDIATRIC BOTTLE CRITICAL VALUE NOTED.  VALUE  IS CONSISTENT WITH PREVIOUSLY REPORTED AND CALLED VALUE.    Culture (A)  Final    ESCHERICHIA COLI SUSCEPTIBILITIES PERFORMED ON PREVIOUS CULTURE WITHIN THE LAST 5 DAYS. Performed at [facility], [place] [address] [place], [place] [address]    Report Status 10/30/2017 FINAL  Final  Blood Culture (routine x 2)     Status: Abnormal   Collection Time: 10/27/17  1:29 AM  Result Value Ref Range Status   Specimen Description BLOOD LEFT HAND  Final   Special Requests IN PEDIATRIC BOTTLE Blood Culture adequate volume  Final   Culture  Setup Time   Final    GRAM NEGATIVE RODS IN PEDIATRIC BOTTLE CRITICAL RESULT CALLED TO, READ BACK BY AND VERIFIED WITH: Otila Back [id number] 2032 MLM Performed at [facility], [place]  [address], [place], [place] [address]    Culture ESCHERICHIA COLI (A)  Final   Report Status 10/29/2017 FINAL  Final   Organism ID, Bacteria ESCHERICHIA COLI  Final      Susceptibility   Escherichia coli - MIC*    AMPICILLIN >=32 RESISTANT Resistant     CEFAZOLIN <=4 SENSITIVE Sensitive     CEFEPIME <=1 SENSITIVE Sensitive     CEFTAZIDIME <=1 SENSITIVE Sensitive     CEFTRIAXONE <=1 SENSITIVE Sensitive     CIPROFLOXACIN >=4 RESISTANT Resistant     GENTAMICIN <=1 SENSITIVE Sensitive     IMIPENEM <=0.25 SENSITIVE Sensitive     TRIMETH/SULFA >=320 RESISTANT Resistant     AMPICILLIN/SULBACTAM 16 INTERMEDIATE Intermediate     PIP/TAZO <=4 SENSITIVE Sensitive     Extended ESBL NEGATIVE Sensitive     * ESCHERICHIA COLI  Blood Culture ID Panel (Reflexed)     Status: Abnormal   Collection Time: 10/27/17  1:29 AM  Result Value Ref Range Status   Enterococcus species NOT DETECTED NOT DETECTED Final   Listeria monocytogenes NOT DETECTED NOT DETECTED Final   Staphylococcus species NOT DETECTED NOT DETECTED Final   Staphylococcus aureus NOT DETECTED NOT DETECTED Final   Streptococcus species NOT DETECTED NOT DETECTED Final   Streptococcus agalactiae NOT DETECTED NOT DETECTED Final   Streptococcus pneumoniae NOT DETECTED NOT DETECTED Final   Streptococcus pyogenes NOT DETECTED NOT DETECTED Final   Acinetobacter baumannii NOT DETECTED NOT DETECTED Final   Enterobacteriaceae species DETECTED (A) NOT DETECTED Final    Comment: Enterobacteriaceae represent a large family of gram-negative bacteria, not a single organism. CRITICAL RESULT CALLED TO, READ BACK BY AND VERIFIED WITH: PJARMD J LEGGE [id number] 2032 MLM    Enterobacter cloacae complex NOT DETECTED NOT DETECTED Final   Escherichia coli DETECTED (A) NOT DETECTED Final    Comment: CRITICAL RESULT CALLED TO, READ BACK BY AND VERIFIED WITH: PHARMD J LEGGE [id number] 2032 MLM    Klebsiella oxytoca NOT DETECTED NOT DETECTED Final   Klebsiella pneumoniae NOT  DETECTED NOT DETECTED Final   Proteus species NOT DETECTED NOT DETECTED Final   Serratia marcescens NOT DETECTED NOT DETECTED Final   Carbapenem resistance NOT DETECTED NOT DETECTED Final   Haemophilus influenzae NOT DETECTED NOT DETECTED Final   Neisseria meningitidis NOT DETECTED NOT DETECTED Final   Pseudomonas aeruginosa NOT DETECTED NOT DETECTED Final   Candida albicans NOT DETECTED NOT DETECTED Final   Candida glabrata NOT DETECTED NOT DETECTED Final   Candida krusei NOT DETECTED NOT DETECTED Final   Candida parapsilosis NOT DETECTED NOT DETECTED Final   Candida tropicalis NOT DETECTED NOT DETECTED Final    Comment: Performed  at [facility], [place] [address] [place], [place] [address]         Radiology Studies: No results found.      Scheduled Meds: . DULoxetine  60 mg Oral QPM  . methadone  10 mg Oral Q8H  . multivitamin with minerals  1 tablet Oral Daily  . potassium chloride  10 mEq Oral BID   Continuous Infusions: . sodium chloride 75 mL/hr at 10/30/17 0613  . cefTRIAXone (ROCEPHIN)  IV Stopped (10/29/17 1805)  . heparin Stopped (10/30/17 1018)     LOS: 3 days      Georgette Shell, MD [facility]  If 7PM-7AM, please contact night-coverage [email or web address] Password [facility] 10/30/2017, 11:46 AM

## 2017-10-30 NOTE — Evaluation (Signed)
Physical Therapy Evaluation Patient Details Name: Carolyn Barton MRN: [id number] DOB: [birth year] Today's Date: 10/30/2017   History of Present Illness  62 yo female admitted with UTI, sepsis, progressive cystic pelvic mass. Hx of met colorectal cancer-on chemo, PE, DVT, chronic pain, anemia.     Clinical Impression  On eval, pt required Min assist for mobility. She walked ~50 feet with a RW. Pain rated 6/10 with activity. Pt presents with general weakness, decreased activity tolerance, and impaired gait and balance. Discussed d/c plan-pt plans to return home with husband assisting. Recommend HHPT and RW for safe ambulation. Will follow and progress activity as tolerated.     Follow Up Recommendations Home health PT;Supervision/Assistance - 24 hour    Equipment Recommendations  Rolling walker with 5" wheels    Recommendations for Other Services       Precautions / Restrictions Precautions Precautions: Fall Restrictions Weight Bearing Restrictions: No LLE Weight Bearing: Weight bearing as tolerated      Mobility  Bed Mobility Overal bed mobility: Needs Assistance Bed Mobility: Supine to Sit;Sit to Supine     Supine to sit: Min guard;HOB elevated Sit to supine: Min guard;HOB elevated   General bed mobility comments: Increased time. close guard for safety.   Transfers Overall transfer level: Needs assistance Equipment used: Rolling walker (2 wheeled) Transfers: Sit to/from Stand Sit to Stand: Min assist         General transfer comment: Assist to rise, stabilize. VCs hand placement.   Ambulation/Gait Ambulation/Gait assistance: Min assist Ambulation Distance (Feet): 50 Feet Assistive device: Rolling walker (2 wheeled) Gait Pattern/deviations: Step-through pattern;Decreased stride length;Decreased step length - right;Decreased step length - left     General Gait Details: slow gait speed. Assist to stabilize throughout distance. Pt fatigues easily.   Stairs            Wheelchair Mobility    Modified Rankin (Stroke Patients Only)       Balance Overall balance assessment: Needs assistance         Standing balance support: Bilateral upper extremity supported Standing balance-Leahy Scale: Poor Standing balance comment: requiring RW                             Pertinent Vitals/Pain Pain Assessment: 0-10 Pain Score: 6  Pain Location: bil LEs with activity Pain Descriptors / Indicators: Aching;Sore;Discomfort Pain Intervention(s): Limited activity within patient's tolerance;Repositioned    Home Living Family/patient expects to be discharged to:: Private residence Living Arrangements: Spouse/significant other   Type of Home: House Home Access: Stairs to enter Entrance Stairs-Rails: Psychiatric nurse of Steps: 2 Home Layout: One level Home Equipment: Cane - single point      Prior Function           Comments: using cane for ambulation     Hand Dominance        Extremity/Trunk Assessment   Upper Extremity Assessment Upper Extremity Assessment: Generalized weakness    Lower Extremity Assessment Lower Extremity Assessment: Generalized weakness    Cervical / Trunk Assessment Cervical / Trunk Assessment: Kyphotic  Communication   Communication: No difficulties  Cognition Arousal/Alertness: Awake/alert Behavior During Therapy: WFL for tasks assessed/performed Overall Cognitive Status: Within Functional Limits for tasks assessed(mostly)                                 General Comments: will repeat responses at  times      General Comments      Exercises     Assessment/Plan    PT Assessment Patient needs continued PT services  PT Problem List Decreased strength;Decreased mobility;Decreased activity tolerance;Decreased balance;Decreased knowledge of use of DME;Pain       PT Treatment Interventions Gait training;DME instruction;Therapeutic activities;Therapeutic  exercise;Patient/family education;Balance training;Functional mobility training    PT Goals (Current goals can be found in the Care Plan section)  Acute Rehab PT Goals Patient Stated Goal: to get stronger PT Goal Formulation: With patient/family Time For Goal Achievement: 11/13/17 Potential to Achieve Goals: Fair    Frequency Min 3X/week   Barriers to discharge        Co-evaluation               AM-PAC PT "6 Clicks" Daily Activity  Outcome Measure Difficulty turning over in bed (including adjusting bedclothes, sheets and blankets)?: A Little Difficulty moving from lying on back to sitting on the side of the bed? : A Little Difficulty sitting down on and standing up from a chair with arms (e.g., wheelchair, bedside commode, etc,.)?: Unable Help needed moving to and from a bed to chair (including a wheelchair)?: A Little Help needed walking in hospital room?: A Little Help needed climbing 3-5 steps with a railing? : A Lot 6 Click Score: 15    End of Session Equipment Utilized During Treatment: Gait belt Activity Tolerance: Patient limited by fatigue;Patient limited by pain Patient left: in bed;with call bell/phone within reach;with bed alarm set;with family/visitor present   PT Visit Diagnosis: Muscle weakness (generalized) (M62.81);Difficulty in walking, not elsewhere classified (R26.2);Pain Pain - Right/Left: (bilateral) Pain - part of body: Leg    Time: 9678-9381 PT Time Calculation (min) (ACUTE ONLY): 21 min   Charges:   PT Evaluation $PT Eval Moderate Complexity: 1 Mod     PT G Codes:          Weston Anna, MPT Pager: [phone]

## 2017-10-30 NOTE — Progress Notes (Addendum)
[place] for enoxaparin >> heparin IV Indication: pulmonary embolus  No Known Allergies  Patient Measurements: Height: 5\' 5"  (165.1 cm) Weight: 141 lb 5 oz (64.1 kg) IBW/kg (Calculated) : 57 Heparin Dosing Weight: TBW  Vital Signs: Temp: 98 F (36.7 C) (02/01 0551) Temp Source: Oral (02/01 0551) BP: 117/79 (02/01 0551) Pulse Rate: 99 (02/01 0551)  Labs: Recent Labs    10/29/17 0352 10/29/17 5409 10/30/17 0259 10/30/17 0748  HGB  --  9.9* 10.3*  --   HCT  --  29.4* 31.1*  --   PLT  --  73* 81*  --   LABPROT  --   --  12.7  --   INR  --   --  0.97  --   HEPARINUNFRC  --   --   --  0.30  CREATININE QUESTIONABLE RESULTS, RECOMMEND RECOLLECT TO VERIFY 0.76 0.58  --     Estimated Creatinine Clearance: 67.3 mL/min (by C-G formula based on SCr of 0.58 mg/dL).  Medications:  Medications Prior to Admission  Medication Sig Dispense Refill Last Dose  . DULoxetine (CYMBALTA) 60 MG capsule Take 1 capsule (60 mg total) by mouth every evening. 30 capsule 3 10/26/2017 at Unknown time  . enoxaparin (LOVENOX) 60 MG/0.6ML injection Inject 0.6 mLs (60 mg total) into the skin every 12 (twelve) hours for 9 doses.   10/26/2017 at Unknown time  . fluticasone (CUTIVATE) 0.05 % cream Apply topically 2 (two) times daily. 30 g 1 10/26/2017 at Unknown time  . HYDROmorphone (DILAUDID) 4 MG tablet Take 4 mg by mouth every 4 (four) hours as needed for severe pain.    10/26/2017 at Unknown time  . lidocaine-prilocaine (EMLA) cream Apply to port site one hour prior to use. Do not rub in. Cover with plastic. 30 g 2 Past Month at Unknown time  . Meth-Hyo-M Bl-Na Phos-Ph Sal (URIBEL) 118 MG CAPS Take 1 capsule (118 mg total) by mouth 3 times/day as needed-between meals & bedtime. 120 capsule 3 Past Month at Unknown time  . methadone (DOLOPHINE) 10 MG tablet Take 10 mg by mouth 3 (three) times daily.    10/26/2017 at Unknown time  . minocycline (MINOCIN) 100 MG capsule Take  1 capsule (100 mg total) by mouth 2 (two) times daily. 60 capsule 11 10/26/2017 at Unknown time  . morphine (MSIR) 15 MG tablet Take 1-2 tablets (15-30 mg total) by mouth every 4 (four) hours as needed for severe pain. 120 tablet 0 10/26/2017 at Unknown time  . omeprazole (PRILOSEC) 20 MG capsule Take 1 capsule (20 mg total) by mouth daily. (Patient taking differently: Take 20 mg by mouth daily as needed (reflux). ) 30 capsule 5 Past Week at Unknown time  . ondansetron (ZOFRAN) 8 MG tablet Take 1 tablet (8 mg total) by mouth every 8 (eight) hours as needed for nausea or vomiting. 20 tablet 1 10/26/2017 at Unknown time  . polyethylene glycol (MIRALAX / GLYCOLAX) packet Take 17 g by mouth daily as needed for mild constipation.    Past Week at Unknown time  . potassium chloride (K-DUR) 10 MEQ tablet Take 1 tablet (10 mEq total) by mouth 2 (two) times daily. 60 tablet 1 10/26/2017 at Unknown time  . prochlorperazine (COMPAZINE) 10 MG tablet Take 1 tablet (10 mg total) by mouth every 6 (six) hours as needed for nausea or vomiting. 30 tablet 1 Past Week at Unknown time  . sorbitol 70 % solution Take 30 cc twice  today, then 30 cc daily 473 mL 0 10/26/2017 at Unknown time  . ferrous sulfate 325 (65 FE) MG EC tablet Take 1 tablet (325 mg total) by mouth daily. (Patient not taking: Reported on 10/27/2017)  0 Not Taking at Unknown time  . ketorolac (TORADOL) 10 MG tablet Take 1 tablet (10 mg total) by mouth every 8 (eight) hours. for pain (Patient not taking: Reported on 10/27/2017) 6 tablet 0 Not Taking at Unknown time  . phenazopyridine (PYRIDIUM) 200 MG tablet Take 1 tablet (200 mg total) by mouth 3 (three) times daily as needed for pain. (Patient not taking: Reported on 10/22/2017) 30 tablet 0 Completed Course at Unknown time   Scheduled:  . DULoxetine  60 mg Oral QPM  . methadone  10 mg Oral Q8H  . multivitamin with minerals  1 tablet Oral Daily  . 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 On enoxaparin 1 mg/kg q12hr PTA which was continued by pharmacy on admission. Now scheduled for IR drainage of cystic pelvic mass on 2/1 by IR and pharmacy consulted to bridge with UFH.   Baseline INR, aPTT: not done (already on LMWH)  Prior anticoagulation: Lovenox 60 mg q12 hr, LD 1/31 @ 1034  Significant events:  Heparin off at 1010 prior to 2/1 IR procedure  Today, 10/30/2017:  CBC: Hgb/Plt both low but improved since yesterday  Heparin level this AM borderline low at 0.30 on 1100 units/hr  Heparin stopped at 1010 for IR drainage  No bleeding or infusion issues per nursing  CrCl: 67 ml/min  Goal of Therapy: Heparin level 0.3-0.7 units/ml Monitor platelets by anticoagulation protocol: Yes  Plan:  Increased heparin this AM to 1200 units/hr to keep levels therapeutic  F/u timing to resume heparin  Daily CBC, daily heparin level once stable  Anticipate transition back to Lovenox once patient stable without bleeding post-drainage, and then Xarelto at discharge, per Dr. Gearldine Shown recommendations  Monitor for signs of bleeding or thrombosis   Reuel Boom, PharmD Pager: [phone] 10/30/2017, 11:37 AM    PM ADDENDUM  Underwent IR drainage w/o complications  IR recommends resuming heparin immediately  Will resume heparin at 1200 units/hr  Recheck heparin level in 6 hrs  Reuel Boom, PharmD, BCPS [phone] 10/30/2017, 3:11 PM

## 2017-10-30 NOTE — Procedures (Signed)
Interventional Radiology Procedure Note  Procedure: CT guided aspiration of pelvic cyst.  ~900cc of dark thin fluid aspirated.  No drain was left behind, awaiting culture and cytology.  Complications: None Recommendations:  - Ok to shower tomorrow - Do not submerge for 7 days - Routine wound care   Signed,  Dulcy Fanny. Earleen Newport, DO

## 2017-10-31 LAB — CBC
HCT: 34.3 % — ABNORMAL LOW (ref 36.0–46.0)
HEMOGLOBIN: 11.4 g/dL — AB (ref 12.0–15.0)
MCH: 29.1 pg (ref 26.0–34.0)
MCHC: 33.2 g/dL (ref 30.0–36.0)
MCV: 87.5 fL (ref 78.0–100.0)
PLATELETS: 112 10*3/uL — AB (ref 150–400)
RBC: 3.92 MIL/uL (ref 3.87–5.11)
RDW: 16.5 % — ABNORMAL HIGH (ref 11.5–15.5)
WBC: 6.9 10*3/uL (ref 4.0–10.5)

## 2017-10-31 LAB — BASIC METABOLIC PANEL
Anion gap: 5 (ref 5–15)
BUN: <5 mg/dL — ABNORMAL LOW (ref 6–20)
CO2: 26 mmol/L (ref 22–32)
Calcium: 7.4 mg/dL — ABNORMAL LOW (ref 8.9–10.3)
Chloride: 106 mmol/L (ref 101–111)
Creatinine, Ser: 0.43 mg/dL — ABNORMAL LOW (ref 0.44–1.00)
GFR calc Af Amer: >60 mL/min (ref >60)
Glucose, Bld: 71 mg/dL (ref 65–99)
POTASSIUM: 3.7 mmol/L (ref 3.5–5.1)
SODIUM: 137 mmol/L (ref 135–145)

## 2017-10-31 LAB — HEPARIN LEVEL (UNFRACTIONATED): HEPARIN UNFRACTIONATED: 0.27 [IU]/mL — AB (ref 0.30–0.70)

## 2017-10-31 MED ORDER — HEPARIN (PORCINE) IN NACL 100-0.45 UNIT/ML-% IJ SOLN: 1400.0000 [IU]/h | INTRAMUSCULAR | Status: DC

## 2017-10-31 MED ORDER — RIVAROXABAN 20 MG PO TABS: 20.0000 mg | ORAL_TABLET | Freq: Every day | ORAL | Status: DC

## 2017-10-31 MED ORDER — CEPHALEXIN 500 MG PO CAPS
500.0000 mg | ORAL_CAPSULE | Freq: Four times a day (QID) | ORAL | Status: DC
  Administered 2017-10-31 – 2017-11-01 (×4): 500 mg via ORAL
  Filled 2017-10-31 (×5): qty 1

## 2017-10-31 MED ORDER — RIVAROXABAN 15 MG PO TABS
15.0000 mg | ORAL_TABLET | Freq: Two times a day (BID) | ORAL | Status: DC
  Administered 2017-10-31 – 2017-11-02 (×5): 15 mg via ORAL
  Filled 2017-10-31 (×5): qty 1

## 2017-10-31 NOTE — Progress Notes (Addendum)
[place] for enoxaparin >> heparin IV >> Xarelto Indication: pulmonary embolus  No Known Allergies  Patient Measurements: Height: 5\' 5"  (165.1 cm) Weight: 141 lb 5 oz (64.1 kg) IBW/kg (Calculated) : 57 Heparin Dosing Weight: TBW  Vital Signs: Temp: 98 F (36.7 C) (02/02 0432) Temp Source: Oral (02/02 0432) BP: 114/78 (02/02 0432) Pulse Rate: 100 (02/02 0432)  Labs: Recent Labs    10/29/17 4332 10/30/17 0259 10/30/17 0748 10/30/17 2035 10/31/17 0655 10/31/17 0707  HGB 9.9* 10.3*  --   --   --  11.4*  HCT 29.4* 31.1*  --   --   --  34.3*  PLT 73* 81*  --   --   --  112*  LABPROT  --  12.7  --   --   --   --   INR  --  0.97  --   --   --   --   HEPARINUNFRC  --   --  0.30 0.19* 0.27*  --   CREATININE 0.76 0.58  --   --   --  0.43*    Estimated Creatinine Clearance: 67.3 mL/min (A) (by C-G formula based on SCr of 0.43 mg/dL (L)). 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 On enoxaparin 1 mg/kg q12hr PTA which was continued by pharmacy on admission. Now s/p IR drainage of cystic pelvic mass and pharmacy consulted to bridge with UFH.   Baseline INR, aPTT: not done (already on LMWH)  Prior anticoagulation: Lovenox 60 mg q12 hr, LD 1/31 @ 1034  Significant events:  Heparin off at 1010 prior to 2/1 IR procedure  Heparin resume 2/1 at 1530 at previous rate   Today, 10/31/2017:  Hgb and Plt still low but both improved over past 2 days  Heparin level still borderline low after recent rate increase to 1300 units/hr  No bleeding or infusion issues reported by RN  Goal of Therapy: Heparin level 0.3-0.7 units/ml Monitor platelets by anticoagulation protocol: Yes  Plan:  Increase heparin to 1400 units/hr and check 6-hr  heparin level  Daily CBC, daily heparin level  Anticipate transition back to Lovenox or Xarelto once stable  Monitor for signs of bleeding or thrombosis  Reuel Boom, PharmD, BCPS [phone] 10/31/2017, 8:59 AM  ADDENDUM  Patient likely discharging today; MD OK to switch to Xarelto, CrCl wnl  Xarelto 15 mg bid x 3 weeks, followed by Xarelto 20 mg daily thereafter  If any additional bleeding risk factors, could consider shortening course to end 21 days after initial therapy started (11/11/17)  Pharmacy to provide education prior to discharge  Reuel Boom, PharmD, BCPS [phone] 10/31/2017, 9:55 AM

## 2017-10-31 NOTE — Progress Notes (Signed)
Palliative care progress note  Reason for consult: Pain management for metastatic disease  I met this morning with Carolyn Barton and her husband.  She reports that her pain has been well controlled.  She has used infrequent MSIR and has not had any IV dilaudid.  Reports stomach flatter and feeling better since drainage of pelvic cyst (900cc).  For pain, will continue with home regimen of methadone 10 mg 3 times daily for long acting agent.  For breakthrough pain, plan on MSIR 15-'30mg'$  prn.  She was not taking methadone TID as prescribed prior to admit but understands the importance of this now.  Her husband is now aware she was not taking as directed as well,  Now that he is aware of issue, he will help her to manage to ensure she is taking as directed once she leaves.    I think that this will be a good regimen for her to continue on discharge.  I was able to call and discuss with her OP pain management office last week [facility]) and they were in agreement.  She does have a follow-up appointment scheduled with her outpatient pain management clinic on February 13.  Total time: 30 minutes Greater than 50%  of this time was spent counseling and coordinating care related to the above assessment and plan.  Micheline Rough, MD [place] Team [phone]

## 2017-10-31 NOTE — Progress Notes (Signed)
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  10/29/2017-she denies any nausea or vomiting today however her appetite is diminished. Still have abdominal pain and distention.     Assessment & Plan:   Principal Problem:   Lower urinary tract infectious disease Active Problems:   Malnutrition of moderate degree   Rectal cancer (HCC)   Metastatic colorectal cancer (HCC)   Bilateral pulmonary embolism (HCC)   Tachycardia   Urinary retention   Chemotherapy-induced neutropenia (HCC)   UTI (urinary tract infection)   Left femoral vein DVT (HCC)   Sepsis (HCC)   Acute pyelonephritis    Sepsis from E. coli bacteremia, pyelonephritis Case was discussed with urology, they felt givenbilateral ureteralStents in place on CT venogram, no intervention needed at this time.leukocytosis worse.monitor. -Will start p.o. Keflex 500 mg 4 times a day.   coMplex cystic pelvic mass-significant interval enlargement from 5 x 4 cm to 13 x 13 cm. Appears more cystic ovarian neoplasm than colon cancer.  Status post IR drainage 900 cc.   Chronic abdominal pain -Continue methadone -per dr Domingo Cocking   Sinus tachycardia Chronic  Newly diagnosed bilateral pulmonary embolism Femoral DVT Start Xarelto today         DVT prophylaxis: Xarelto Code Status: Full code discussed with patient Family Communication: No family  available Disposition Plan: Plan for discharge tomorrow , patient being started on Xarelto today monitor for bleeding status post recent IR drainage of the complex cyst yesterday. consultants:  IR Procedures: Drainage of complex pelvic mass 10/30/2017 Antimicrobials: Keflex being started today  Subjective: Feels a lot better pain in the left leg is better abdomen has decreased in size. Objective: Vitals:   10/30/17 1305 10/30/17 1356 10/30/17 2041 10/31/17 0432  BP: 113/76 106/66 112/71 114/78  Pulse: (!) 107 97 (!) 113 100  Resp: 12 14 16 16   Temp:  97.7 F (36.5 C) 98.3 F (36.8 C) 98 F (36.7 C)  TempSrc:  Oral Oral Oral  SpO2: 100% 100% 100% 100%  Weight:      Height:        Intake/Output Summary (Last 24 hours) at 10/31/2017 1233 Last data filed at 10/31/2017 1157 Gross per 24 hour  Intake 2137.58 ml  Output 4775 ml  Net -2637.42 ml   Filed Weights   10/26/17 2138 10/27/17 0301  Weight: 62.6 kg (138 lb) 64.1 kg (141 lb 5 oz)    Examination:  General exam: Appears calm and comfortable  Respiratory system: Clear to auscultation. Respiratory effort normal. Cardiovascular system: S1 & S2 heard, RRR. No JVD, murmurs, rubs, gallops or clicks. No pedal edema. Gastrointestinal system: Abdomen is nondistended, soft and nontender. No organomegaly or masses felt. Normal bowel sounds heard. Central nervous system: Alert and oriented. No focal neurological deficits. Extremities: Symmetric 5 x 5 power. Skin: No rashes, lesions or ulcers Psychiatry: Judgement and insight appear normal. Mood & affect appropriate.     Data Reviewed: I have personally reviewed following labs and imaging studies  CBC: Recent Labs  Lab 10/26/17 2336 10/27/17 4097  10/29/17 2263 10/30/17 0259 10/31/17 0707  WBC 1.2* 13.8* 18.8* 13.8* 6.9  NEUTROABS 1.0*  --   --   --   --   HGB 12.2 10.7* 9.9* 10.3* 11.4*  HCT 36.8 31.5* 29.4* 31.1* 34.3*  MCV 86.8 87.5 86.0 86.9 87.5  PLT 286 197 73* 81*  335*   Basic Metabolic Panel: Recent Labs  Lab 10/27/17 0644 10/29/17 0352 10/29/17 0632 10/30/17 0259 10/31/17 0707  NA 132* QUESTIONABLE RESULTS, RECOMMEND RECOLLECT TO VERIFY 133* 137 137  K 3.1* QUESTIONABLE RESULTS, RECOMMEND RECOLLECT TO VERIFY 3.5 3.6 3.7  CL 101 QUESTIONABLE RESULTS, RECOMMEND RECOLLECT TO VERIFY 106 108 106  CO2 23 QUESTIONABLE RESULTS, RECOMMEND RECOLLECT TO VERIFY 21* 23 26  GLUCOSE 114* QUESTIONABLE RESULTS, RECOMMEND RECOLLECT TO VERIFY 63* 77 71  BUN 15 QUESTIONABLE RESULTS, RECOMMEND RECOLLECT TO VERIFY 16 11 <5*  CREATININE 0.83 QUESTIONABLE RESULTS, RECOMMEND RECOLLECT TO VERIFY 0.76 0.58 0.43*  CALCIUM 7.3* QUESTIONABLE RESULTS, RECOMMEND RECOLLECT TO VERIFY 7.4* 7.6* 7.4*   GFR: Estimated Creatinine Clearance: 67.3 mL/min (A) (by C-G formula based on SCr of 0.43 mg/dL (L)). Liver Function Tests: No results for input(s): AST, ALT, ALKPHOS, BILITOT, PROT, ALBUMIN in the last 168 hours. No results for input(s): LIPASE, AMYLASE in the last 168 hours. No results for input(s): AMMONIA in the last 168 hours. Coagulation Profile: Recent Labs  Lab 10/30/17 0259  INR 0.97   Cardiac Enzymes: No results for input(s): CKTOTAL, CKMB, CKMBINDEX, TROPONINI in the last 168 hours. BNP (last 3 results) No results for input(s): PROBNP in the last 8760 hours. HbA1C: No results for input(s): HGBA1C in the last 72 hours. CBG: No results for input(s): GLUCAP in the last 168 hours. Lipid Profile: No results for input(s): CHOL, HDL, LDLCALC, TRIG, CHOLHDL, LDLDIRECT in the last 72 hours. Thyroid Function Tests: No results for input(s): TSH, T4TOTAL, FREET4, T3FREE, THYROIDAB in the last 72 hours. Anemia Panel: No results for input(s): VITAMINB12, FOLATE, FERRITIN, TIBC, IRON, RETICCTPCT in the last 72 hours. Sepsis Labs: Recent Labs  Lab 10/27/17 0142  LATICACIDVEN 1.50    Recent Results (from the past 240 hour(s))  Urine C&S     Status: Abnormal    Collection Time: 10/26/17 11:11 PM  Result Value Ref Range Status   Specimen Description URINE, CATHETERIZED  Final   Special Requests Immunocompromised  Final   Culture >=100,000 COLONIES/mL ESCHERICHIA COLI (A)  Final   Report Status 10/29/2017 FINAL  Final   Organism ID, Bacteria ESCHERICHIA COLI (A)  Final      Susceptibility   Escherichia coli - MIC*    AMPICILLIN >=32 RESISTANT Resistant     CEFAZOLIN <=4 SENSITIVE Sensitive     CEFTRIAXONE <=1 SENSITIVE Sensitive     CIPROFLOXACIN >=4 RESISTANT Resistant     GENTAMICIN <=1 SENSITIVE Sensitive     IMIPENEM <=0.25 SENSITIVE Sensitive     NITROFURANTOIN <=16 SENSITIVE Sensitive     TRIMETH/SULFA >=320 RESISTANT Resistant     AMPICILLIN/SULBACTAM 8 SENSITIVE Sensitive     PIP/TAZO <=4 SENSITIVE Sensitive     Extended ESBL NEGATIVE Sensitive     * >=100,000 COLONIES/mL ESCHERICHIA COLI  Blood Culture (routine x 2)     Status: Abnormal   Collection Time: 10/27/17  1:29 AM  Result Value Ref Range Status   Specimen Description   Final    BLOOD RIGHT HAND Performed at [facility] [address] [place], [place] [address]    Special Requests   Final  IN PEDIATRIC BOTTLE Blood Culture adequate volume Performed at [facility] [address] [place], [place] [address]    Culture  Setup Time   Final    GRAM NEGATIVE RODS IN PEDIATRIC BOTTLE CRITICAL VALUE NOTED.  VALUE IS CONSISTENT WITH PREVIOUSLY REPORTED AND CALLED VALUE.    Culture (A)  Final    ESCHERICHIA COLI SUSCEPTIBILITIES PERFORMED ON PREVIOUS CULTURE WITHIN THE LAST 5 DAYS. Performed at [facility], [place] [address] [place], [place] [address]    Report Status 10/30/2017 FINAL  Final  Blood Culture (routine x 2)     Status: Abnormal   Collection Time: 10/27/17  1:29 AM  Result Value Ref Range Status   Specimen Description BLOOD LEFT HAND  Final   Special Requests IN PEDIATRIC BOTTLE Blood Culture adequate volume   Final   Culture  Setup Time   Final    GRAM NEGATIVE RODS IN PEDIATRIC BOTTLE CRITICAL RESULT CALLED TO, READ BACK BY AND VERIFIED WITH: Otila Back [id number] 2032 MLM Performed at [facility], [place] [address] [place], [place] [address]    Culture ESCHERICHIA COLI (A)  Final   Report Status 10/29/2017 FINAL  Final   Organism ID, Bacteria ESCHERICHIA COLI  Final      Susceptibility   Escherichia coli - MIC*    AMPICILLIN >=32 RESISTANT Resistant     CEFAZOLIN <=4 SENSITIVE Sensitive     CEFEPIME <=1 SENSITIVE Sensitive     CEFTAZIDIME <=1 SENSITIVE Sensitive     CEFTRIAXONE <=1 SENSITIVE Sensitive     CIPROFLOXACIN >=4 RESISTANT Resistant     GENTAMICIN <=1 SENSITIVE Sensitive     IMIPENEM <=0.25 SENSITIVE Sensitive     TRIMETH/SULFA >=320 RESISTANT Resistant     AMPICILLIN/SULBACTAM 16 INTERMEDIATE Intermediate     PIP/TAZO <=4 SENSITIVE Sensitive     Extended ESBL NEGATIVE Sensitive     * ESCHERICHIA COLI  Blood Culture ID Panel (Reflexed)     Status: Abnormal   Collection Time: 10/27/17  1:29 AM  Result Value Ref Range Status   Enterococcus species NOT DETECTED NOT DETECTED Final   Listeria monocytogenes NOT DETECTED NOT DETECTED Final   Staphylococcus species NOT DETECTED NOT DETECTED Final   Staphylococcus aureus NOT DETECTED NOT DETECTED Final   Streptococcus species NOT DETECTED NOT DETECTED Final   Streptococcus agalactiae NOT DETECTED NOT DETECTED Final   Streptococcus pneumoniae NOT DETECTED NOT DETECTED Final   Streptococcus pyogenes NOT DETECTED NOT DETECTED Final   Acinetobacter baumannii NOT DETECTED NOT DETECTED Final   Enterobacteriaceae species DETECTED (A) NOT DETECTED Final    Comment: Enterobacteriaceae represent a large family of gram-negative bacteria, not a single organism. CRITICAL RESULT CALLED TO, READ BACK BY AND VERIFIED WITH: PJARMD J LEGGE [id number] 2032 MLM    Enterobacter cloacae complex NOT DETECTED NOT DETECTED Final   Escherichia coli  DETECTED (A) NOT DETECTED Final    Comment: CRITICAL RESULT CALLED TO, READ BACK BY AND VERIFIED WITH: PHARMD J LEGGE [id number] 2032 MLM    Klebsiella oxytoca NOT DETECTED NOT DETECTED Final   Klebsiella pneumoniae NOT DETECTED NOT DETECTED Final   Proteus species NOT DETECTED NOT DETECTED Final   Serratia marcescens NOT DETECTED NOT DETECTED Final   Carbapenem resistance NOT DETECTED NOT DETECTED Final   Haemophilus influenzae NOT DETECTED NOT DETECTED Final   Neisseria meningitidis NOT DETECTED NOT DETECTED Final   Pseudomonas aeruginosa NOT DETECTED NOT DETECTED Final   Candida albicans NOT DETECTED  NOT DETECTED Final   Candida glabrata NOT DETECTED NOT DETECTED Final   Candida krusei NOT DETECTED NOT DETECTED Final   Candida parapsilosis NOT DETECTED NOT DETECTED Final   Candida tropicalis NOT DETECTED NOT DETECTED Final    Comment: Performed at [facility], [place] [address] [place], [place] [address]  Aerobic/Anaerobic Culture (surgical/deep wound)     Status: None (Preliminary result)   Collection Time: 10/30/17  1:43 PM  Result Value Ref Range Status   Specimen Description   Final    PELVIS Performed at [facility] [address], [place], [place] [address]    Special Requests   Final    NONE Performed at [facility], [place] [address], [place], [place] [address]    Gram Stain NO WBC SEEN NO ORGANISMS SEEN   Final   Culture   Final    NO GROWTH 1 DAY Performed at [facility], [place] [address], [place], [place] [address]    Report Status PENDING  Incomplete         Radiology Studies: Ct Aspiration  Result Date: 10/30/2017 INDICATION: 62 year old female with pelvic cystic mass. Concerning for malignancy or infection. EXAM: CT-GUIDED ASPIRATION OF PELVIC MASS MEDICATIONS: None ANESTHESIA/SEDATION: Fentanyl 50 mcg IV; Versed 1.0 mg IV Moderate Sedation Time:  19 minutes The patient was continuously monitored during the  procedure by the interventional radiology nurse under my direct supervision. COMPLICATIONS: None PROCEDURE: Informed written consent was obtained from the patient after a thorough discussion of the procedural risks, benefits and alternatives. All questions were addressed. Maximal Sterile Barrier Technique was utilized including caps, mask, sterile gowns, sterile gloves, sterile drape, hand hygiene and skin antiseptic. A timeout was performed prior to the initiation of the procedure. Patient position prone position on the CT gantry table. Scout CT was performed for planning purposes. The patient is prepped and draped in the usual sterile fashion, the skin and subcutaneous tissues were generously infiltrated 1% lidocaine for local anesthesia. Using CT guidance, trocar needle was placed into the cystic lesion of the pelvis via trans gluteal approach. Modified Seldinger technique was used to place a 10 [place] drain into the mass. Aspiration was then performed with a sample sent for culture and a sample of fluid sent to cytology. Approximately 900 cc of fluid was aspirated. Drain was removed and a final image was stored. Patient tolerated the procedure well and remained hemodynamically stable throughout. No complications were encountered and no significant blood loss. IMPRESSION: Status post CT-guided aspiration of cystic pelvic mass with approximately 900 cc of thin dark fluid aspirated. Sample sent for culture and for cytology. Signed, Dulcy Fanny. Earleen Newport, DO [facility] Electronically Signed   By: Corrie Mckusick D.O.   On: 10/30/2017 16:19        Scheduled Meds: . cephALEXin  500 mg Oral Q6H  . DULoxetine  60 mg Oral QPM  . methadone  10 mg Oral Q8H  . multivitamin with minerals  1 tablet Oral Daily  . potassium chloride  10 mEq Oral BID  . Rivaroxaban  15 mg Oral BID WC  . [START ON 11/21/2017] rivaroxaban  20 mg Oral Q supper   Continuous  Infusions: . sodium chloride 75 mL/hr at 10/31/17 0522     LOS: 4 days      Georgette Shell, MD [facility]  If 7PM-7AM, please contact night-coverage [email or web address] Password [facility] 10/31/2017, 12:33 PM

## 2017-11-01 LAB — BASIC METABOLIC PANEL
ANION GAP: 5 (ref 5–15)
BUN: <5 mg/dL — ABNORMAL LOW (ref 6–20)
CALCIUM: 7.7 mg/dL — AB (ref 8.9–10.3)
CO2: 30 mmol/L (ref 22–32)
Chloride: 105 mmol/L (ref 101–111)
Creatinine, Ser: 0.34 mg/dL — ABNORMAL LOW (ref 0.44–1.00)
Glucose, Bld: 99 mg/dL (ref 65–99)
Potassium: 3.3 mmol/L — ABNORMAL LOW (ref 3.5–5.1)
Sodium: 140 mmol/L (ref 135–145)

## 2017-11-01 LAB — CBC
HEMATOCRIT: 30.9 % — AB (ref 36.0–46.0)
HEMATOCRIT: 31.6 % — AB (ref 36.0–46.0)
Hemoglobin: 10.3 g/dL — ABNORMAL LOW (ref 12.0–15.0)
Hemoglobin: 10.5 g/dL — ABNORMAL LOW (ref 12.0–15.0)
MCH: 29.3 pg (ref 26.0–34.0)
MCH: 29 pg (ref 26.0–34.0)
MCHC: 33.3 g/dL (ref 30.0–36.0)
MCHC: 33.2 g/dL (ref 30.0–36.0)
MCV: 87.8 fL (ref 78.0–100.0)
MCV: 87.3 fL (ref 78.0–100.0)
Platelets: 167 10*3/uL (ref 150–400)
Platelets: 211 10*3/uL (ref 150–400)
RBC: 3.52 MIL/uL — AB (ref 3.87–5.11)
RBC: 3.62 MIL/uL — ABNORMAL LOW (ref 3.87–5.11)
RDW: 16.4 % — AB (ref 11.5–15.5)
RDW: 16.3 % — AB (ref 11.5–15.5)
WBC: 6.7 10*3/uL (ref 4.0–10.5)
WBC: 6.3 10*3/uL (ref 4.0–10.5)

## 2017-11-01 MED ORDER — HEPARIN SOD (PORK) LOCK FLUSH 100 UNIT/ML IV SOLN
500.0000 [IU] | INTRAVENOUS | Status: AC | PRN
  Administered 2017-11-01: 500 [IU]

## 2017-11-01 MED ORDER — CEFPODOXIME PROXETIL 200 MG PO TABS
400.0000 mg | ORAL_TABLET | Freq: Two times a day (BID) | ORAL | Status: DC
  Administered 2017-11-01 – 2017-11-02 (×3): 400 mg via ORAL
  Filled 2017-11-01 (×3): qty 2

## 2017-11-01 MED ORDER — MORPHINE SULFATE 15 MG PO TABS: 15.0000 mg | ORAL_TABLET | ORAL | 0 refills | Status: AC | PRN

## 2017-11-01 MED ORDER — RIVAROXABAN (XARELTO) VTE STARTER PACK (15 & 20 MG): ORAL_TABLET | ORAL | 0 refills | Status: DC

## 2017-11-01 MED ORDER — CEPHALEXIN 500 MG PO CAPS: 500.0000 mg | ORAL_CAPSULE | Freq: Four times a day (QID) | ORAL | 0 refills | Status: DC

## 2017-11-01 MED ORDER — MORPHINE SULFATE 15 MG PO TABS: 15.0000 mg | ORAL_TABLET | ORAL | 0 refills | Status: DC | PRN

## 2017-11-01 MED ORDER — CEFPODOXIME PROXETIL 200 MG PO TABS: 400.0000 mg | ORAL_TABLET | Freq: Two times a day (BID) | ORAL | 0 refills | Status: AC

## 2017-11-01 MED ORDER — ADULT MULTIVITAMIN W/MINERALS CH: 1.0000 | ORAL_TABLET | Freq: Every day | ORAL | Status: DC

## 2017-11-01 NOTE — Discharge Instructions (Addendum)
Information on my medicine - XARELTO (rivaroxaban)  This medication education was reviewed with me or my healthcare representative as part of my discharge preparation.  The pharmacist that spoke with me during my hospital stay was:  Kyanne Rials A, [place]? Xarelto was prescribed to treat blood clots that may have been found in the veins of your legs (deep vein thrombosis) or in your lungs (pulmonary embolism) and to reduce the risk of them occurring again.  What do you need to know about Xarelto? The starting dose is one 15 mg tablet taken TWICE daily with food for the FIRST 21 DAYS then on (enter date)  11/21/2017  the dose is changed to one 20 mg tablet taken ONCE A DAY with your evening meal.  DO NOT stop taking Xarelto without talking to the health care provider who prescribed the medication.  Refill your prescription for 20 mg tablets before you run out.  After discharge, you should have regular check-up appointments with your healthcare provider that is prescribing your Xarelto.  In the future your dose may need to be changed if your kidney function changes by a significant amount.  What do you do if you miss a dose? If you are taking Xarelto TWICE DAILY and you miss a dose, take it as soon as you remember. You may take two 15 mg tablets (total 30 mg) at the same time then resume your regularly scheduled 15 mg twice daily the next day.  If you are taking Xarelto ONCE DAILY and you miss a dose, take it as soon as you remember on the same day then continue your regularly scheduled once daily regimen the next day. Do not take two doses of Xarelto at the same time.   Important Safety Information Xarelto is a blood thinner medicine that can cause bleeding. You should call your healthcare provider right away if you experience any of the following: ? Bleeding from an injury or your nose that does not stop. ? Unusual colored urine (red or dark brown) or  unusual colored stools (red or black). ? Unusual bruising for unknown reasons. ? A serious fall or if you hit your head (even if there is no bleeding).  Some medicines may interact with Xarelto and might increase your risk of bleeding while on Xarelto. To help avoid this, consult your healthcare provider or pharmacist prior to using any new prescription or non-prescription medications, including herbals, vitamins, non-steroidal anti-inflammatory drugs (NSAIDs) and supplements.  This website has more information on Xarelto: [email or web address].[email or web address]

## 2017-11-01 NOTE — Progress Notes (Signed)
Palliative care progress note  Reason for consult: Pain management for metastatic disease  I met this morning with Carolyn Barton this AM.  She reports that her pain has been well controlled.  She is excited to be going home today.  I met with her today to review pain regimen for discharge (she was not taking methadone as prescribed prior to admit and asked if I would review plan for pain management on day when she gets discharged.  We reviewed plan for pain medications on discharge and she was able to repeat back to me how to use regimen appropriately.  We also wrote down regimen and reviewed together.  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  For breakthrough pain, plan on MSIR 15-'30mg'$  prn.  Her husband is aware of her difficulty taking medication as prescribed, and he will help her to manage to ensure she is taking as directed once she leaves.  I was able to call and discuss with her OP pain management office last week [facility]) and they were in agreement.  She does have a follow-up appointment scheduled with her outpatient pain management clinic on February 13.  She understands to call their office with concerns regarding pain regimen once she discharges.  Total time: 30 minutes Greater than 50%  of this time was spent counseling and coordinating care related to the above assessment and plan.  Micheline Rough, MD [place] Team [phone]

## 2017-11-01 NOTE — Discharge Summary (Addendum)
Physician Discharge Summary  KAYLEI FRINK YNW:[id number] DOB: [birth year] DOA: 10/26/2017  PCP: Marton Redwood, MD  Admit date: 10/26/2017 Discharge date: 11/02/2017 Admitted From: Home Disposition: Home  Recommendations for Outpatient Follow-up:  1. Follow up with PCP in 1-2 weeks 2. 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  10/29/2017-she denies any nausea or vomiting today however her appetite is diminished. Still have abdominal pain and distention.   Discharge Diagnoses:  Principal Problem:   Lower urinary tract infectious disease Active Problems:   Malnutrition of moderate degree   Rectal cancer (HCC)   Metastatic colorectal cancer (HCC)   Bilateral pulmonary embolism (HCC)   Tachycardia   Urinary retention   Chemotherapy-induced neutropenia (HCC)   UTI (urinary tract infection)   Left femoral vein DVT (HCC)   Sepsis (HCC)   Acute pyelonephritis  1] sepsis/pyelonephritis/E. coli bacteremia-patient was treated with IV antibiotics during the hospital stay and was switched to Keflex 500 mg 4 times a day upon discharge.  Patient is followed by urology she has a stent in both the ureteral.  She is due to follow-up with urology once discharged from here.  Patient has a Foley catheter in place she will be discharged home with the Foley catheter in place and  she will follow-up with urology.  2] complex cystic pelvic mass 13 x 13 cm status post IR drainage over 900 cc 10/30/2017.  3] newly diagnosed bilateral pulmonary embolism/femoral DVT-Xarelto started yesterday.  Discharge Instructions  Discharge Instructions    Call MD for:  difficulty breathing, headache or visual disturbances   Complete by:  As directed    Call MD for:  persistant dizziness or light-headedness   Complete by:  As directed    Call MD for:  persistant nausea and vomiting   Complete by:  As directed    Call MD for:  redness, tenderness, or signs of infection (pain, swelling, redness, odor or green/yellow discharge around incision site)   Complete by:  As directed    Call MD for:  severe uncontrolled pain   Complete by:  As directed    Call MD for:  temperature >100.4   Complete by:  As directed    Diet - low sodium heart healthy   Complete by:  As directed    Increase activity slowly   Complete by:  As directed      Allergies as of 11/01/2017   No Known Allergies     Medication List    STOP taking these medications   enoxaparin 60 MG/0.6ML injection Commonly known as:  LOVENOX   HYDROmorphone 4 MG tablet Commonly known as:  DILAUDID   ketorolac 10 MG tablet Commonly known as:  TORADOL     TAKE these medications   cephALEXin 500 MG capsule Commonly known as:  KEFLEX Take 1 capsule (500 mg total) by mouth every 6 (six) hours.   DULoxetine 60 MG capsule Commonly known as:  CYMBALTA Take 1 capsule (60 mg total) by mouth every evening.   ferrous sulfate  325 (65 FE) MG EC tablet Take 1 tablet (325 mg total) by mouth daily.   fluticasone 0.05 % cream Commonly known as:  CUTIVATE Apply topically 2 (two) times daily.   lidocaine-prilocaine cream Commonly known as:  EMLA Apply to port site one hour prior to use. Do not rub in. Cover with plastic.   methadone 10 MG tablet Commonly known as:  DOLOPHINE Take 10 mg by mouth 3 (three) times daily.    minocycline 100 MG capsule Commonly known as:  MINOCIN Take 1 capsule (100 mg total) by mouth 2 (two) times daily.   morphine 15 MG tablet Commonly known as:  MSIR Take 1-2 tablets (15-30 mg total) by mouth every 4 (four) hours as needed for severe pain. What changed:  Another medication with the same name was added. Make sure you understand how and when to take each.   morphine 15 MG tablet Commonly known as:  MSIR Take 1 tablet (15 mg total) by mouth every 4 (four) hours as needed for severe pain. What changed:  You were already taking a medication with the same name, and this prescription was added. Make sure you understand how and when to take each.   multivitamin with minerals Tabs tablet Take 1 tablet by mouth daily.   omeprazole 20 MG capsule Commonly known as:  PRILOSEC Take 1 capsule (20 mg total) by mouth daily. What changed:    when to take this  reasons to take this   ondansetron 8 MG tablet Commonly known as:  ZOFRAN Take 1 tablet (8 mg total) by mouth every 8 (eight) hours as needed for nausea or vomiting.   phenazopyridine 200 MG tablet Commonly known as:  PYRIDIUM Take 1 tablet (200 mg total) by mouth 3 (three) times daily as needed for pain.   polyethylene glycol packet Commonly known as:  MIRALAX / GLYCOLAX Take 17 g by mouth daily as needed for mild constipation.   potassium chloride 10 MEQ tablet Commonly known as:  K-DUR Take 1 tablet (10 mEq total) by mouth 2 (two) times daily.   prochlorperazine 10 MG tablet Commonly known as:  COMPAZINE Take 1 tablet (10 mg total) by mouth every 6 (six) hours as needed for nausea or vomiting.   Rivaroxaban 15 & 20 MG Tbpk Take as directed on package: Start with one 15mg  tablet by mouth twice a day with food. On Day 22, switch to one 20mg  tablet once a day with food.   sorbitol 70 % solution Take 30 cc twice today, then 30 cc daily   URIBEL 118 MG Caps Take 1 capsule (118 mg total) by mouth 3 times/day as  needed-between meals & bedtime.       No Known Allergies  Consultations:  IR,PALLIATIVE CARE,ONC   Procedures/Studies: Dg Chest 2 View  Result Date: 10/22/2017 CLINICAL DATA:  Shortness of breath for several hours EXAM: CHEST  2 VIEW COMPARISON:  03/18/2016 FINDINGS: Cardiac shadow is within normal limits. Right-sided chest wall port is noted in satisfactory position. The lungs are well aerated bilaterally without focal infiltrate or sizable effusion. Bilateral ureteral stents are noted. No bony abnormality is seen. IMPRESSION: No active cardiopulmonary disease. Electronically Signed   By: Inez Catalina M.D.   On: 10/22/2017 18:22   Dg Abdomen 1 View  Result Date: 10/27/2017 CLINICAL DATA:  Ureteral stents. Minimal urine output. Abdominal pain. EXAM: ABDOMEN - 1 VIEW COMPARISON:  Fluoroscopy 10/13/2017. CT abdomen and pelvis 10/11/2016 FINDINGS: Bilateral ureteral stents are in place  without change in position since the previous fluoroscopic images. No radiopaque stones are identified. Left lower quadrant colostomy. Scattered gas and stool throughout the colon. No small or large bowel distention. Degenerative changes and postoperative changes in the lumbar spine. Lumbar scoliosis convex towards the left. Degenerative changes in the hips. IMPRESSION: Bilateral ureteral stents appear unchanged in position since previous study. Stool-filled colon. No evidence of bowel obstruction. Electronically Signed   By: Lucienne Capers M.D.   On: 10/27/2017 02:37   Ct Angio Chest Pe W/cm &/or Wo Cm  Result Date: 10/22/2017 CLINICAL DATA:  62 year old female with increase shortness of breath, metastatic colorectal cancer undergoing chemotherapy. EXAM: CT ANGIOGRAPHY CHEST WITH CONTRAST TECHNIQUE: Multidetector CT imaging of the chest was performed using the standard protocol during bolus administration of intravenous contrast. Multiplanar CT image reconstructions and MIPs were obtained to evaluate the  vascular anatomy. CONTRAST:  142mL ISOVUE-370 IOPAMIDOL (ISOVUE-370) INJECTION 76% COMPARISON:  CTA chest 04/04/2016 FINDINGS: Cardiovascular: Good contrast bolus timing in the pulmonary arterial tree. Positive bilateral pulmonary artery filling defects consistent with pulmonary emboli. Left upper and lower lobe involvement beginning at the left hilum. Similar right upper lobe, right middle lobe and lower lobe involvement. No saddle embolus. RV / LV ratio = 0.8 No pericardial effusion. Negative visible aorta. Right chest porta cath in place. Mediastinum/Nodes: No mediastinal lymphadenopathy. Moderate size gastric hiatal hernia is stable since 2017. Lungs/Pleura: Major airways are patent. No pleural effusion. No abnormal pulmonary opacity. Upper Abdomen: Partially visible left hydronephrosis and hydroureter with a left ureteral stent in place. Partially visible right hydronephrosis, with faintly visible proximal aspect of a right ureteral stent. No upper abdominal free fluid. Negative visible liver, gallbladder, spleen, pancreas, adrenal glands, and bowel in the upper abdomen. Musculoskeletal: Osteopenia. No acute osseous abnormality identified. Review of the MIP images confirms the above findings. IMPRESSION: 1. Positive for bilateral acute pulmonary emboli. Bilateral upper and lower lobe involvement, but no saddle embolus or CTA evidence of right heart strain. 2. No pleural effusion or pulmonary infarct. 3. Partially visible hydronephrosis and bilateral ureteral stents in the upper abdomen. Critical Value/emergent results were called by telephone at the time of interpretation on 10/22/2017 at 7:43 pm to Dr. Alferd Apa , who verbally acknowledged these results. Electronically Signed   By: Genevie Ann M.D.   On: 10/22/2017 19:48   Ct Abdomen Pelvis W Contrast  Result Date: 10/08/2017 CLINICAL DATA:  Metastatic colorectal cancer with history of peritoneal carcinomatosis and liver metastasis. EXAM: CT ABDOMEN AND  PELVIS WITH CONTRAST TECHNIQUE: Multidetector CT imaging of the abdomen and pelvis was performed using the standard protocol following bolus administration of intravenous contrast. CONTRAST:  130mL ISOVUE-300 IOPAMIDOL (ISOVUE-300) INJECTION 61%, 69mL ISOVUE-300 IOPAMIDOL (ISOVUE-300) INJECTION 61% COMPARISON:  CT scan 06/19/2017 FINDINGS: Lower chest: The lung bases are clear of acute process. No worrisome pulmonary lesions. The heart is normal in size. Large hiatal hernia. Hepatobiliary: Stable small low-attenuation lesion in segment 7. No findings for hepatic metastatic disease. The gallbladder is normal. No common bile duct dilatation. Pancreas: No mass, inflammation or ductal dilatation. Spleen: Normal size.  No focal lesions. Adrenals/Urinary Tract: The adrenal glands and right kidney are unremarkable and stable. The upper pole moiety of the left kidney demonstrates moderate hydronephrosis with a double-J ureteral stent extending down into the bladder. Decreased perfusion involving the upper pole region of the left kidney. The lower pole appears normal. Stomach/Bowel: The stomach, duodenum, small bowel and colon appears stable. A left lower quadrant loop  colostomy is noted. No obvious obstruction. There is some stool in the rectum and lower sigmoid colon. Vascular/Lymphatic: Stable scattered aortic and iliac artery calcifications. Branch vessels are patent. The major venous structures are patent. Small scattered retroperitoneal lymph nodes are stable. Reproductive: The uterus is displaced superiorly and to the right by a large complex cystic mass in the left pelvis. Lesion measures approximately 13 x 13 x 11 cm and occupies most of the lower pelvis. There is significant mass effect on bladder anteriorly. Suspect left ovarian neoplasm. Other: No inguinal mass or adenopathy. Musculoskeletal: No significant bony findings. Stable scoliosis, lumbar fusion hardware and bilateral degenerative hip disease. IMPRESSION:  1. Significant interval enlargement of the complex cystic pelvic mass. It measured 5 x 4 cm on the prior study and now measures 13 x 13 cm. It has more the appearance of a cystic ovarian neoplasm than colon cancer. 2. Small scattered retroperitoneal lymph nodes are stable. 3. No findings for hepatic metastatic disease. 4. Stable left-sided double-J ureteral stent in the upper pole moiety of the left kidney. There is hydronephrosis and poor perfusion suggesting the stent may be clogged. Electronically Signed   By: Marijo Sanes M.D.   On: 10/08/2017 11:33   Ct Aspiration  Result Date: 10/30/2017 INDICATION: 62 year old female with pelvic cystic mass. Concerning for malignancy or infection. EXAM: CT-GUIDED ASPIRATION OF PELVIC MASS MEDICATIONS: None ANESTHESIA/SEDATION: Fentanyl 50 mcg IV; Versed 1.0 mg IV Moderate Sedation Time:  19 minutes The patient was continuously monitored during the procedure by the interventional radiology nurse under my direct supervision. COMPLICATIONS: None PROCEDURE: Informed written consent was obtained from the patient after a thorough discussion of the procedural risks, benefits and alternatives. All questions were addressed. Maximal Sterile Barrier Technique was utilized including caps, mask, sterile gowns, sterile gloves, sterile drape, hand hygiene and skin antiseptic. A timeout was performed prior to the initiation of the procedure. Patient position prone position on the CT gantry table. Scout CT was performed for planning purposes. The patient is prepped and draped in the usual sterile fashion, the skin and subcutaneous tissues were generously infiltrated 1% lidocaine for local anesthesia. Using CT guidance, trocar needle was placed into the cystic lesion of the pelvis via trans gluteal approach. Modified Seldinger technique was used to place a 10 [place] drain into the mass. Aspiration was then performed with a sample sent for culture and a sample of fluid sent to cytology.  Approximately 900 cc of fluid was aspirated. Drain was removed and a final image was stored. Patient tolerated the procedure well and remained hemodynamically stable throughout. No complications were encountered and no significant blood loss. IMPRESSION: Status post CT-guided aspiration of cystic pelvic mass with approximately 900 cc of thin dark fluid aspirated. Sample sent for culture and for cytology. Signed, Dulcy Fanny. Earleen Newport, DO [facility] Electronically Signed   By: Corrie Mckusick D.O.   On: 10/30/2017 16:19   Ct Renal Stone Study  Result Date: 10/11/2017 CLINICAL DATA:  Right flank pain. History of nephrolithiasis. History of metastatic colorectal cancer with peritoneal carcinomatosis and liver metastases. EXAM: CT ABDOMEN AND PELVIS WITHOUT CONTRAST TECHNIQUE: Multidetector CT imaging of the abdomen and pelvis was performed following the standard protocol without IV contrast. COMPARISON:  10/08/2017. FINDINGS: Lower chest: Clear lung bases. Hepatobiliary: Stable small right lobe liver cyst. Normal appearing gallbladder. Pancreas: Unremarkable. No pancreatic ductal dilatation or surrounding inflammatory changes. Spleen: Normal in size without focal abnormality. Adrenals/Urinary Tract: Normal appearing adrenal glands.  Interval moderate dilatation of the right renal collecting system and right ureter to the level of the previously demonstrated large cystic and solid pelvic mass. Mildly improved dilatation of the upper pole moiety of the left renal collecting system with a stable double-J ureteral stent extending into the region of the posterior aspect of a poorly distended urinary bladder on the left. No urinary tract calculi are seen. Stomach/Bowel: Moderate-sized hiatal hernia. Unremarkable small bowel. Stool and barium in the colon with a left lower quadrant colostomy again demonstrated. Vascular/Lymphatic: Minimal atheromatous arterial  calcifications without aneurysm. Stable mildly prominent bilateral inguinal lymph nodes, including a 9 mm short axis left inguinal node on image number 76 of series 2 and an 8 mm short axis right inguinal node on image number 83 of series 2. Reproductive: The previously demonstrated 13.1 x 13.0 cm cystic and solid central pelvic mass currently measures 13.3 x 12.9 cm on image number 64 series 2. This measures 11.7 cm in length on coronal image number 62, previously 10.9 cm. Surgically absent uterus. Other: No free peritoneal fluid. Musculoskeletal: L5-S1 interbody and pedicle screw and rod fusion. Mild lumbar and lower thoracic spine degenerative changes and mild to moderate scoliosis. Mild right hip degenerative changes. IMPRESSION: 1. Interval moderate right hydronephrosis and hydroureter, most likely due to distal ureteral obstruction by the large cystic and solid pelvic mass. 2. Mildly improved left upper pole hydronephrosis with a ureteral stent in place. 3. No significant change in a large cystic and solid pelvic mass compatible with a primary ovarian malignancy or ovarian metastasis. 4. No significant change in mildly prominent bilateral inguinal lymph nodes. 5. Moderate-sized hiatal hernia. Electronically Signed   By: Claudie Revering M.D.   On: 10/11/2017 18:16   Ct Venogram Abd/pel  Result Date: 10/27/2017 CLINICAL DATA:  62 year old female with a history of pulmonary embolism EXAM: CTA ABDOMEN AND PELVIS WITH CONTRAST TECHNIQUE: Multidetector CT imaging of the abdomen and pelvis was performed using the standard protocol during bolus administration of intravenous contrast. Multiplanar reconstructed images and MIPs were obtained and reviewed to evaluate the vascular anatomy. CONTRAST:  156mL ISOVUE-370 IOPAMIDOL (ISOVUE-370) INJECTION 76% COMPARISON:  CT a chest 10/22/2017, abdominal CT 10/11/2017, 10/08/2017, 06/19/2017, 12/25/2016 FINDINGS: VASCULAR Aorta: Mild atherosclerotic changes of the aorta with no  significant calcifications. No aneurysm or dissection. Celiac: Celiac artery patent with no greater than 50% narrowing at the origin. SMA: Superior mesenteric artery patent with no significant atherosclerotic changes at the origin. Renals: Bilateral renal arteries are patent with no atherosclerotic changes at the origin. IMA: Inferior mesenteric artery patent. Right lower extremity: Minimal atherosclerotic changes of the right iliac system. Right iliac arterial system and proximal femoral arteries patent. Left lower extremity: Minimal atherosclerotic changes of the left iliac system. Left iliac system and the proximal left femoral arteries are patent. Veins: Unremarkable appearance of the portal system, with patency of the splenic vein, portal spleno confluence, and the portal veins. Superior mesenteric vein and inferior mesenteric vein are patent. The IVC is patent at the liver, juxtarenal IVC, and infrarenal IVC. There is compression and attenuation of the left common iliac vein at typical location of May-Thurner anatomy. Left pelvic iliac veins are partially opacified. The left external iliac vein is compressed by the pelvic mass with no appreciable contrast opacification. The left common femoral vein demonstrates nonocclusive thrombus. The right common iliac vein and external iliac vein are patent. No filling defects identified within the right-sided iliac venous system or the right common femoral vein. Collateral  venous drainage from left pelvis to right pelvis within the superficial/body wall veins. Review of the MIP images confirms the above findings. NON-VASCULAR Lower chest: No acute. Hiatal hernia with retained material within the thoracic esophagus and stomach above the hiatus. Hepatobiliary: Unremarkable appearance of liver with no focal lesion identified. Unremarkable gallbladder with no cholelithiasis. No intrahepatic or extrahepatic biliary ductal dilatation. Pancreas: Unremarkable pancreas. Spleen:  Unremarkable spleen Adrenals/Urinary Tract: Unremarkable adrenal glands. Right: Double-J ureteral stent within the right ureter, with the proximal loop formed in the hilum and the distal loop formed within decompressed urinary bladder. Since the CT dated 10/08/2017, there has been development of differential attenuation of the right-sided kidney parenchyma, with a clear demarcation of decreased perfusion/edema of the inferior cortex of the right kidney. There is mild dilation of the inferior pole moiety collecting system, with mild dilation of the proximal inferior pole ureter, which is lateral to the ureteral stent. The site of connection of the partially duplicated right-sided ureters not identified on the study. Left: Double-J ureteral stent on the left, with the proximal loop formed within the upper pole moiety collecting system and the distal loop formed in the decompressed distal ureter. Since the prior CT 10/08/2017, there is continued differential attenuation of the left-sided kidney parenchyma, with clear demarcation of decreased perfusion/edema of the superior pole moiety of the left kidney. There is mild dilation of the inferior pole moiety collecting system as well as mild dilation of the inferior pole ureter. The site at which the partially duplicated ureters join is not identified on the current study. Urinary bladder decompressed. Catheter within the urinary bladder at the anterior pelvis, displaced by the pelvic mass. Stomach/Bowel: Hiatal hernia. Retained debris/material within the thoracic esophagus and stomach. No significant dilation of small bowel. Ostomy of the left abdomen. Moderate degree of formed stool within the colon. Lymphatic: Since the prior CT there has been development of body wall anasarca/edema, as well as small volume ascites and edema throughout the abdominal mesentery/fat. Again, small lymph nodes in the periaortic/preaortic nodal stations. Reproductive: Redemonstration of  partially cystic and solid right ovarian mass/pelvic mass. The pelvic mass contributes to mass effect on the adjacent urinary bladder, bilateral ureters, and the vasculature, as above. Greatest diameter on the current CT measures 17 cm, which is increased from the comparison CT, potentially secondary to differences in technique, or actual growth. Other: Edema of the proximal left leg Musculoskeletal: Accentuated kyphotic curvature. No displaced fracture. Surgical changes of the lumbar spine. IMPRESSION: The CT venogram study is positive for nonocclusive DVT of the left common femoral vein. There is compression or potentially occlusion of the left iliac venous system secondary to mass effect from the pelvic mass. Associated collateral venous drainage in the body wall, with proximal left leg edema. Since the prior CT, there is now differential perfusion/edema of the bilateral kidneys, new on the right and similar to the left. The right kidney is affected at the inferior pole and the left kidney at the superior pole. The edema is favored to be secondary to at least partial obstruction of the right inferior pole moiety and left superior pole moiety. On the left, this would imply a blocked ureteral stent. Differential includes focal infection/pyelonephritis, or less likely focal infarction from either arterial or venous source. 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 The CT is nondiagnostic to identify whether there is complete duplication on either/both the left or the right. Single bilateral double-J ureteral stents. Both stents appear  to drain the upper pole moiety. Increasing body wall and mesenteric edema. Recommend correlation with fluid status. Developing small volume ascites. 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 Diameter measures 17 cm on axial images on today's study, increased from prior, which may be either actual  growth or differences in technique/measurement. Again, pelvic mass contributes to compression of the left iliac vasculature and the bilateral ureters. Surgical changes of colon ostomy. Electronically Signed   By: Corrie Mckusick D.O.   On: 10/27/2017 11:28    (Echo, Carotid, EGD, Colonoscopy, ERCP)    Subjective:   Discharge Exam: Vitals:   10/31/17 2100 11/01/17 0510  BP: 111/67 112/76  Pulse: 92 94  Resp: 16 18  Temp: 98.1 F (36.7 C) 98 F (36.7 C)  SpO2: 99% 100%   Vitals:   10/30/17 2041 10/31/17 0432 10/31/17 2100 11/01/17 0510  BP: 112/71 114/78 111/67 112/76  Pulse: (!) 113 100 92 94  Resp: 16 16 16 18   Temp: 98.3 F (36.8 C) 98 F (36.7 C) 98.1 F (36.7 C) 98 F (36.7 C)  TempSrc: Oral Oral Oral Oral  SpO2: 100% 100% 99% 100%  Weight:      Height:        General: Pt is alert, awake, not in acute distress Cardiovascular: RRR, S1/S2 +, no rubs, no gallops Respiratory: CTA bilaterally, no wheezing, no rhonchi Abdominal: Soft, NT, ND, bowel sounds + Extremities: no edema, no cyanosis    The results of significant diagnostics from this hospitalization (including imaging, microbiology, ancillary and laboratory) are listed below for reference.     Microbiology: Recent Results (from the past 240 hour(s))  Urine C&S     Status: Abnormal   Collection Time: 10/26/17 11:11 PM  Result Value Ref Range Status   Specimen Description URINE, CATHETERIZED  Final   Special Requests Immunocompromised  Final   Culture >=100,000 COLONIES/mL ESCHERICHIA COLI (A)  Final   Report Status 10/29/2017 FINAL  Final   Organism ID, Bacteria ESCHERICHIA COLI (A)  Final      Susceptibility   Escherichia coli - MIC*    AMPICILLIN >=32 RESISTANT Resistant     CEFAZOLIN <=4 SENSITIVE Sensitive     CEFTRIAXONE <=1 SENSITIVE Sensitive     CIPROFLOXACIN >=4 RESISTANT Resistant     GENTAMICIN <=1 SENSITIVE Sensitive     IMIPENEM <=0.25 SENSITIVE Sensitive     NITROFURANTOIN <=16  SENSITIVE Sensitive     TRIMETH/SULFA >=320 RESISTANT Resistant     AMPICILLIN/SULBACTAM 8 SENSITIVE Sensitive     PIP/TAZO <=4 SENSITIVE Sensitive     Extended ESBL NEGATIVE Sensitive     * >=100,000 COLONIES/mL ESCHERICHIA COLI  Blood Culture (routine x 2)     Status: Abnormal   Collection Time: 10/27/17  1:29 AM  Result Value Ref Range Status   Specimen Description   Final    BLOOD RIGHT HAND Performed at [facility] [address] [place], [place] [address]    Special Requests   Final    IN PEDIATRIC BOTTLE Blood Culture adequate volume Performed at [facility] [address] [place], [place] [address]    Culture  Setup Time   Final    GRAM NEGATIVE RODS IN PEDIATRIC BOTTLE CRITICAL VALUE NOTED.  VALUE IS CONSISTENT WITH PREVIOUSLY REPORTED AND CALLED VALUE.    Culture (A)  Final    ESCHERICHIA COLI SUSCEPTIBILITIES PERFORMED ON PREVIOUS CULTURE WITHIN THE LAST 5 DAYS. Performed at [facility], [place] [address] [place], [place] [address]    Report Status 10/30/2017 FINAL  Final  Blood Culture (routine x 2)     Status: Abnormal   Collection Time: 10/27/17  1:29 AM  Result Value Ref Range Status   Specimen Description BLOOD LEFT HAND  Final   Special Requests IN PEDIATRIC BOTTLE Blood Culture adequate volume  Final   Culture  Setup Time   Final    GRAM NEGATIVE RODS IN PEDIATRIC BOTTLE CRITICAL RESULT CALLED TO, READ BACK BY AND VERIFIED WITH: Otila Back [id number] 2032 MLM Performed at [facility], [place] [address], [place], [place] [address]    Culture ESCHERICHIA COLI (A)  Final   Report Status 10/29/2017 FINAL  Final   Organism ID, Bacteria ESCHERICHIA COLI  Final      Susceptibility   Escherichia coli - MIC*    AMPICILLIN >=32 RESISTANT Resistant     CEFAZOLIN <=4 SENSITIVE Sensitive     CEFEPIME <=1 SENSITIVE Sensitive     CEFTAZIDIME <=1 SENSITIVE Sensitive     CEFTRIAXONE <=1 SENSITIVE Sensitive      CIPROFLOXACIN >=4 RESISTANT Resistant     GENTAMICIN <=1 SENSITIVE Sensitive     IMIPENEM <=0.25 SENSITIVE Sensitive     TRIMETH/SULFA >=320 RESISTANT Resistant     AMPICILLIN/SULBACTAM 16 INTERMEDIATE Intermediate     PIP/TAZO <=4 SENSITIVE Sensitive     Extended ESBL NEGATIVE Sensitive     * ESCHERICHIA COLI  Blood Culture ID Panel (Reflexed)     Status: Abnormal   Collection Time: 10/27/17  1:29 AM  Result Value Ref Range Status   Enterococcus species NOT DETECTED NOT DETECTED Final   Listeria monocytogenes NOT DETECTED NOT DETECTED Final   Staphylococcus species NOT DETECTED NOT DETECTED Final   Staphylococcus aureus NOT DETECTED NOT DETECTED Final   Streptococcus species NOT DETECTED NOT DETECTED Final   Streptococcus agalactiae NOT DETECTED NOT DETECTED Final   Streptococcus pneumoniae NOT DETECTED NOT DETECTED Final   Streptococcus pyogenes NOT DETECTED NOT DETECTED Final   Acinetobacter baumannii NOT DETECTED NOT DETECTED Final   Enterobacteriaceae species DETECTED (A) NOT DETECTED Final    Comment: Enterobacteriaceae represent a large family of gram-negative bacteria, not a single organism. CRITICAL RESULT CALLED TO, READ BACK BY AND VERIFIED WITH: PJARMD J LEGGE [id number] 2032 MLM    Enterobacter cloacae complex NOT DETECTED NOT DETECTED Final   Escherichia coli DETECTED (A) NOT DETECTED Final    Comment: CRITICAL RESULT CALLED TO, READ BACK BY AND VERIFIED WITH: PHARMD J LEGGE [id number] 2032 MLM    Klebsiella oxytoca NOT DETECTED NOT DETECTED Final   Klebsiella pneumoniae NOT DETECTED NOT DETECTED Final   Proteus species NOT DETECTED NOT DETECTED Final   Serratia marcescens NOT DETECTED NOT DETECTED Final   Carbapenem resistance NOT DETECTED NOT DETECTED Final   Haemophilus influenzae NOT DETECTED NOT DETECTED Final   Neisseria meningitidis NOT DETECTED NOT DETECTED Final   Pseudomonas aeruginosa NOT DETECTED NOT DETECTED Final   Candida albicans NOT DETECTED NOT DETECTED  Final   Candida glabrata NOT DETECTED NOT DETECTED Final   Candida krusei NOT DETECTED NOT DETECTED Final   Candida parapsilosis NOT DETECTED NOT DETECTED Final   Candida tropicalis NOT DETECTED NOT DETECTED Final    Comment: Performed at [facility], [place] [address] [place], [place] [address]  Aerobic/Anaerobic Culture (surgical/deep wound)     Status: None (Preliminary result)   Collection Time: 10/30/17  1:43 PM  Result Value Ref Range Status   Specimen Description   Final    PELVIS Performed at [facility] [address] [place], [place] [address]    Special Requests   Final    NONE Performed at [facility], [place] [address], [place], [place] [address]    Gram Stain NO WBC SEEN NO ORGANISMS SEEN   Final   Culture   Final    NO GROWTH 1 DAY Performed at [facility], [place] [address], [place], [place] [address]    Report Status PENDING  Incomplete     Labs: BNP (last 3 results) Recent Labs    10/22/17 2331  BNP 91.4   Basic Metabolic Panel: Recent Labs  Lab 10/29/17 0352 10/29/17 0632 10/30/17 0259 10/31/17 0707 11/01/17 0320  NA QUESTIONABLE RESULTS, RECOMMEND RECOLLECT TO VERIFY 133* 137 137 140  K QUESTIONABLE RESULTS, RECOMMEND RECOLLECT TO VERIFY 3.5 3.6 3.7 3.3*  CL QUESTIONABLE RESULTS, RECOMMEND RECOLLECT TO VERIFY 106 108 106 105  CO2 QUESTIONABLE RESULTS, RECOMMEND RECOLLECT TO VERIFY 21* 23 26 30   GLUCOSE QUESTIONABLE RESULTS, RECOMMEND RECOLLECT TO VERIFY 63* 77 71 99  BUN QUESTIONABLE RESULTS, RECOMMEND RECOLLECT TO VERIFY 16 11 <5* <5*  CREATININE QUESTIONABLE RESULTS, RECOMMEND RECOLLECT TO VERIFY 0.76 0.58 0.43* 0.34*  CALCIUM QUESTIONABLE RESULTS, RECOMMEND RECOLLECT TO VERIFY 7.4* 7.6* 7.4* 7.7*   Liver Function Tests: No results for input(s): AST, ALT, ALKPHOS, BILITOT, PROT, ALBUMIN in the last 168 hours. No results for input(s): LIPASE, AMYLASE in the last 168 hours. No results for  input(s): AMMONIA in the last 168 hours. CBC: Recent Labs  Lab 10/26/17 2336 10/27/17 0644 10/29/17 7829 10/30/17 0259 10/31/17 0707 11/01/17 0320  WBC 1.2* 13.8* 18.8* 13.8* 6.9 6.7  NEUTROABS 1.0*  --   --   --   --   --   HGB 12.2 10.7* 9.9* 10.3* 11.4* 10.3*  HCT 36.8 31.5* 29.4* 31.1* 34.3* 30.9*  MCV 86.8 87.5 86.0 86.9 87.5 87.8  PLT 286 197 73* 81* 112* 167   Cardiac Enzymes: No results for input(s): CKTOTAL, CKMB, CKMBINDEX, TROPONINI in the last 168 hours. BNP: Invalid input(s): POCBNP CBG: No results for input(s): GLUCAP in the last 168 hours. D-Dimer No results for input(s): DDIMER in the last 72 hours. Hgb A1c No results for input(s): HGBA1C in the last 72 hours. Lipid Profile No results for input(s): CHOL, HDL, LDLCALC, TRIG, CHOLHDL, LDLDIRECT in the last 72 hours. Thyroid function studies No results for input(s): TSH, T4TOTAL, T3FREE, THYROIDAB in the last 72 hours.  Invalid input(s): FREET3 Anemia work up No results for input(s): VITAMINB12, FOLATE, FERRITIN, TIBC, IRON, RETICCTPCT in the last 72 hours. Urinalysis    Component Value Date/Time   COLORURINE AMBER (A) 10/26/2017 2310   APPEARANCEUR CLOUDY (A) 10/26/2017 2310   LABSPEC 1.010 10/26/2017 2310   LABSPEC 1.015 11/17/2016 1547   PHURINE 6.0 10/26/2017 2310   GLUCOSEU NEGATIVE 10/26/2017 2310   GLUCOSEU Negative 11/17/2016 1547   HGBUR LARGE (A) 10/26/2017 2310   BILIRUBINUR NEGATIVE 10/26/2017 2310   BILIRUBINUR Negative 11/17/2016 1547   KETONESUR NEGATIVE 10/26/2017 2310   PROTEINUR 100 (A) 10/26/2017 2310   UROBILINOGEN 0.2 11/17/2016 1547   NITRITE NEGATIVE 10/26/2017 2310   LEUKOCYTESUR LARGE (A) 10/26/2017 2310   LEUKOCYTESUR Small 11/17/2016 1547   Sepsis Labs Invalid input(s): PROCALCITONIN,  WBC,  LACTICIDVEN Microbiology Recent Results (from the past 240 hour(s))  Urine C&S     Status: Abnormal  Collection Time: 10/26/17 11:11 PM  Result Value Ref Range Status    Specimen Description URINE, CATHETERIZED  Final   Special Requests Immunocompromised  Final   Culture >=100,000 COLONIES/mL ESCHERICHIA COLI (A)  Final   Report Status 10/29/2017 FINAL  Final   Organism ID, Bacteria ESCHERICHIA COLI (A)  Final      Susceptibility   Escherichia coli - MIC*    AMPICILLIN >=32 RESISTANT Resistant     CEFAZOLIN <=4 SENSITIVE Sensitive     CEFTRIAXONE <=1 SENSITIVE Sensitive     CIPROFLOXACIN >=4 RESISTANT Resistant     GENTAMICIN <=1 SENSITIVE Sensitive     IMIPENEM <=0.25 SENSITIVE Sensitive     NITROFURANTOIN <=16 SENSITIVE Sensitive     TRIMETH/SULFA >=320 RESISTANT Resistant     AMPICILLIN/SULBACTAM 8 SENSITIVE Sensitive     PIP/TAZO <=4 SENSITIVE Sensitive     Extended ESBL NEGATIVE Sensitive     * >=100,000 COLONIES/mL ESCHERICHIA COLI  Blood Culture (routine x 2)     Status: Abnormal   Collection Time: 10/27/17  1:29 AM  Result Value Ref Range Status   Specimen Description   Final    BLOOD RIGHT HAND Performed at [facility] [address] [place], [place] [address]    Special Requests   Final    IN PEDIATRIC BOTTLE Blood Culture adequate volume Performed at [facility] [address] [place], [place] [address]    Culture  Setup Time   Final    GRAM NEGATIVE RODS IN PEDIATRIC BOTTLE CRITICAL VALUE NOTED.  VALUE IS CONSISTENT WITH PREVIOUSLY REPORTED AND CALLED VALUE.    Culture (A)  Final    ESCHERICHIA COLI SUSCEPTIBILITIES PERFORMED ON PREVIOUS CULTURE WITHIN THE LAST 5 DAYS. Performed at [facility], [place] [address], [place], [place] [address]    Report Status 10/30/2017 FINAL  Final  Blood Culture (routine x 2)     Status: Abnormal   Collection Time: 10/27/17  1:29 AM  Result Value Ref Range Status   Specimen Description BLOOD LEFT HAND  Final   Special Requests IN PEDIATRIC BOTTLE Blood Culture adequate volume  Final   Culture  Setup Time   Final    GRAM NEGATIVE RODS IN  PEDIATRIC BOTTLE CRITICAL RESULT CALLED TO, READ BACK BY AND VERIFIED WITH: Otila Back [id number] 2032 MLM Performed at [facility], [place] [address], [place], [place] [address]    Culture ESCHERICHIA COLI (A)  Final   Report Status 10/29/2017 FINAL  Final   Organism ID, Bacteria ESCHERICHIA COLI  Final      Susceptibility   Escherichia coli - MIC*    AMPICILLIN >=32 RESISTANT Resistant     CEFAZOLIN <=4 SENSITIVE Sensitive     CEFEPIME <=1 SENSITIVE Sensitive     CEFTAZIDIME <=1 SENSITIVE Sensitive     CEFTRIAXONE <=1 SENSITIVE Sensitive     CIPROFLOXACIN >=4 RESISTANT Resistant     GENTAMICIN <=1 SENSITIVE Sensitive     IMIPENEM <=0.25 SENSITIVE Sensitive     TRIMETH/SULFA >=320 RESISTANT Resistant     AMPICILLIN/SULBACTAM 16 INTERMEDIATE Intermediate     PIP/TAZO <=4 SENSITIVE Sensitive     Extended ESBL NEGATIVE Sensitive     * ESCHERICHIA COLI  Blood Culture ID Panel (Reflexed)     Status: Abnormal   Collection Time: 10/27/17  1:29 AM  Result Value Ref Range Status   Enterococcus species NOT DETECTED NOT DETECTED Final   Listeria monocytogenes NOT DETECTED NOT  DETECTED Final   Staphylococcus species NOT DETECTED NOT DETECTED Final   Staphylococcus aureus NOT DETECTED NOT DETECTED Final   Streptococcus species NOT DETECTED NOT DETECTED Final   Streptococcus agalactiae NOT DETECTED NOT DETECTED Final   Streptococcus pneumoniae NOT DETECTED NOT DETECTED Final   Streptococcus pyogenes NOT DETECTED NOT DETECTED Final   Acinetobacter baumannii NOT DETECTED NOT DETECTED Final   Enterobacteriaceae species DETECTED (A) NOT DETECTED Final    Comment: Enterobacteriaceae represent a large family of gram-negative bacteria, not a single organism. CRITICAL RESULT CALLED TO, READ BACK BY AND VERIFIED WITH: PJARMD J LEGGE [id number] 2032 MLM    Enterobacter cloacae complex NOT DETECTED NOT DETECTED Final   Escherichia coli DETECTED (A) NOT DETECTED Final    Comment: CRITICAL RESULT  CALLED TO, READ BACK BY AND VERIFIED WITH: PHARMD J LEGGE [id number] 2032 MLM    Klebsiella oxytoca NOT DETECTED NOT DETECTED Final   Klebsiella pneumoniae NOT DETECTED NOT DETECTED Final   Proteus species NOT DETECTED NOT DETECTED Final   Serratia marcescens NOT DETECTED NOT DETECTED Final   Carbapenem resistance NOT DETECTED NOT DETECTED Final   Haemophilus influenzae NOT DETECTED NOT DETECTED Final   Neisseria meningitidis NOT DETECTED NOT DETECTED Final   Pseudomonas aeruginosa NOT DETECTED NOT DETECTED Final   Candida albicans NOT DETECTED NOT DETECTED Final   Candida glabrata NOT DETECTED NOT DETECTED Final   Candida krusei NOT DETECTED NOT DETECTED Final   Candida parapsilosis NOT DETECTED NOT DETECTED Final   Candida tropicalis NOT DETECTED NOT DETECTED Final    Comment: Performed at [facility], [place] [address], [place], [place] [address]  Aerobic/Anaerobic Culture (surgical/deep wound)     Status: None (Preliminary result)   Collection Time: 10/30/17  1:43 PM  Result Value Ref Range Status   Specimen Description   Final    PELVIS Performed at [facility] [address], [place], [place] [address]    Special Requests   Final    NONE Performed at [facility], [place] [address], [place], [place] [address]    Gram Stain NO WBC SEEN NO ORGANISMS SEEN   Final   Culture   Final    NO GROWTH 1 DAY Performed at [facility], [place] [address] [place], [place] [address]    Report Status PENDING  Incomplete     Time coordinating discharge: Over 30 minutes  SIGNED:   Georgette Shell, MD  [facility] 11/01/2017, 10:41 AM Pager   If 7PM-7AM, please contact night-coverage [email or web address] Password [email or web address]

## 2017-11-01 NOTE — Care Management Note (Signed)
Case Management Note  Patient Details  Name: NAYSA PUSKAS MRN: [id number] Date of Birth: [birth year]  Subjective/Objective:     Sepsis, UTI, Bilateral PE, rectal cancer with mets               Action/Plan: Spoke to pt and explained [facility] may not offer start pack for Xarelto. She could utilize [facility] on [address] for Xarelto. They do have in stock. Pt was provided Xarelto 30 day free trial card and copay card by Pharmacy.   PCP Marton Redwood MD  Expected Discharge Date:  11/01/17               Expected Discharge Plan:  Home/Self Care  In-House Referral:  NA  Discharge planning Services  CM Consult  Post Acute Care Choice:  NA Choice offered to:  NA  DME Arranged:  N/A DME Agency:  NA  HH Arranged:  NA HH Agency:  NA  Status of Service:  Completed, signed off  If discussed at [place] of Stay Meetings, dates discussed:    Additional Comments:  Erenest Rasher, RN 11/01/2017, 11:36 AM

## 2017-11-02 ENCOUNTER — Other Ambulatory Visit: Payer: BLUE CROSS/BLUE SHIELD

## 2017-11-02 ENCOUNTER — Ambulatory Visit: Payer: BLUE CROSS/BLUE SHIELD | Admitting: Oncology

## 2017-11-02 ENCOUNTER — Ambulatory Visit: Payer: BLUE CROSS/BLUE SHIELD

## 2017-11-02 DIAGNOSIS — Z86711 Personal history of pulmonary embolism: Secondary | ICD-10-CM

## 2017-11-02 DIAGNOSIS — Z515 Encounter for palliative care: Secondary | ICD-10-CM

## 2017-11-02 DIAGNOSIS — Z7901 Long term (current) use of anticoagulants: Secondary | ICD-10-CM

## 2017-11-02 DIAGNOSIS — G893 Neoplasm related pain (acute) (chronic): Secondary | ICD-10-CM

## 2017-11-02 LAB — BASIC METABOLIC PANEL
Anion gap: 5 (ref 5–15)
CALCIUM: 7.9 mg/dL — AB (ref 8.9–10.3)
CO2: 31 mmol/L (ref 22–32)
Chloride: 100 mmol/L — ABNORMAL LOW (ref 101–111)
Creatinine, Ser: 0.42 mg/dL — ABNORMAL LOW (ref 0.44–1.00)
GFR calc Af Amer: >60 mL/min (ref >60)
GLUCOSE: 122 mg/dL — AB (ref 65–99)
Potassium: 3.5 mmol/L (ref 3.5–5.1)
Sodium: 136 mmol/L (ref 135–145)

## 2017-11-02 LAB — CBC
HCT: 33.7 % — ABNORMAL LOW (ref 36.0–46.0)
Hemoglobin: 10.9 g/dL — ABNORMAL LOW (ref 12.0–15.0)
MCH: 28.7 pg (ref 26.0–34.0)
MCHC: 32.3 g/dL (ref 30.0–36.0)
MCV: 88.7 fL (ref 78.0–100.0)
PLATELETS: 306 10*3/uL (ref 150–400)
RBC: 3.8 MIL/uL — ABNORMAL LOW (ref 3.87–5.11)
RDW: 16.3 % — AB (ref 11.5–15.5)
WBC: 6.7 10*3/uL (ref 4.0–10.5)

## 2017-11-02 NOTE — Progress Notes (Signed)
Palliative care progress note  Reason for consult: Pain management for metastatic disease  I met this morning with Ms. Provencher this AM.  Husband and daughter also at the bedside.  She reports that her pain has been well controlled.  Developed bleeding from catheter and discharge held yesterday.  She is hopeful to go home today.  I checked on her today for signs of sedation related to methadone and she is tolerating current dose of 10MG TID well.  We reviewed plan for pain medications on discharge and her husband reports understanding regimen as well.  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  For breakthrough pain, plan on MSIR 15-13m prn.  Her husband is aware of her difficulty taking medication as prescribed, and he will help her to manage to ensure she is taking as directed once she leaves.  I was able to call and [facility] and they were in agreement.  She does have a follow-up appointment scheduled with her outpatient pain management clinic on February 13.  She understands to call their office with concerns regarding pain regimen once she discharges.  Total time: 25 minutes Greater than 50%  of this time was spent counseling and coordinating care related to the above assessment and plan.  GMicheline Rough MD [place] [phone]

## 2017-11-02 NOTE — Progress Notes (Signed)
Patient discharged, discharge instructions given and explained to patient. Patient waiting to see her oncologist before she leaves.

## 2017-11-02 NOTE — Progress Notes (Signed)
Patient discharged home, discharge instructions given and explained to patient/husband and they verbalized understanding, patient denies any pain/distress, no wound noted, skin intact. Accompanied home by husband.

## 2017-11-02 NOTE — Progress Notes (Signed)
PT-recc HHC.Patient declines [place]. No further CM needs.

## 2017-11-02 NOTE — Progress Notes (Addendum)
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  10/29/2017-she denies any nausea or vomiting today however her appetite is diminished. Still have abdominal pain and distention.     Assessment & Plan:   Principal Problem:   Lower urinary tract infectious disease Active Problems:   Malnutrition of moderate degree   Rectal cancer (HCC)   Metastatic colorectal cancer (HCC)   Bilateral pulmonary embolism (HCC)   Tachycardia   Urinary retention   Chemotherapy-induced neutropenia (HCC)   UTI (urinary tract infection)   Left femoral vein DVT (HCC)   Sepsis (HCC)   Acute pyelonephritis   Sepsis from E. coli bacteremia, pyelonephritis Case was discussed with urology, they felt givenbilateral ureteralStents in place on CT venogram, no intervention needed at this time.leukocytosis worse.monitor. -Will start Vantin twice a day  coMplex cystic pelvic mass-significant interval enlargement from 5 x 4 cm to 13 x 13 cm. Appears more cystic ovarian neoplasm than colon cancer.  Status post IR drainage 900 cc.   Chronic abdominal pain -Continue methadone -per dr Domingo Cocking   Sinus tachycardia Chronic  Newly diagnosed bilateral pulmonary embolism Femoral DVT Start Xarelto today     DVT prophylaxis: Xarelto  Code Status: Full code Family Communication no family available Disposition Plan: Monitor for further bleeding  and follow-up H&H stable for discharge tomorrow. Consultants:  Oncology and interventional radiology Procedures: IR drainage with Antimicrobials: vantin Subjective: Concerned about the urine color turning with pain.  Objective: Vitals:   11/01/17 0510 11/01/17 1352 11/01/17 2102 11/02/17 0537  BP: 112/76 108/72 121/68 116/72  Pulse: 94 91 96 97  Resp: 18 18 18 18   Temp: 98 F (36.7 C) 98.8 F (37.1 C) 98.9 F (37.2 C) 98.8 F (37.1 C)  TempSrc: Oral Oral Oral Oral  SpO2: 100% 100% 100% 100%  Weight:      Height:        Intake/Output Summary (Last 24 hours) at 11/02/2017 1046 Last data filed at 11/02/2017 0538 Gross per 24 hour  Intake -  Output 3450 ml  Net -3450 ml   Filed Weights   10/26/17 2138 10/27/17 0301  Weight: 62.6 kg (138 lb) 64.1 kg (141 lb 5 oz)    Examination:  General exam: Appears calm and comfortable  Respiratory system: Clear to auscultation. Respiratory effort normal. Cardiovascular system: S1 & S2 heard, RRR. No JVD, murmurs, rubs, gallops or clicks. No pedal edema. Gastrointestinal system: Abdomen is nondistended colostomy , soft and nontender. No organomegaly or masses felt. Normal bowel sounds heard. Central nervous system: Alert and oriented. No focal neurological deficits. Extremities: Symmetric 5 x 5 power. Skin: No rashes, lesions or ulcers Psychiatry: Judgement and insight appear normal. Mood & affect appropriate.     Data Reviewed: I have personally reviewed following labs and imaging studies  CBC: Recent Labs  Lab 10/26/17 2336  10/30/17 0259 10/31/17 0707 11/01/17 0320 11/01/17 1355 11/02/17 0907  WBC 1.2*   < > 13.8* 6.9 6.7 6.3 6.7  NEUTROABS 1.0*  --   --   --   --   --   --  HGB 12.2   < > 10.3* 11.4* 10.3* 10.5* 10.9*  HCT 36.8   < > 31.1* 34.3* 30.9* 31.6* 33.7*  MCV 86.8   < > 86.9 87.5 87.8 87.3 88.7  PLT 286   < > 81* 112* 167 211 306   < > = values in this interval not displayed.   Basic Metabolic  Panel: Recent Labs  Lab 10/29/17 0632 10/30/17 0259 10/31/17 0707 11/01/17 0320 11/02/17 0907  NA 133* 137 137 140 136  K 3.5 3.6 3.7 3.3* 3.5  CL 106 108 106 105 100*  CO2 21* 23 26 30 31   GLUCOSE 63* 77 71 99 122*  BUN 16 11 <5* <5* <5*  CREATININE 0.76 0.58 0.43* 0.34* 0.42*  CALCIUM 7.4* 7.6* 7.4* 7.7* 7.9*   GFR: Estimated Creatinine Clearance: 67.3 mL/min (A) (by C-G formula based on SCr of 0.42 mg/dL (L)). Liver Function Tests: No results for input(s): AST, ALT, ALKPHOS, BILITOT, PROT, ALBUMIN in the last 168 hours. No results for input(s): LIPASE, AMYLASE in the last 168 hours. No results for input(s): AMMONIA in the last 168 hours. Coagulation Profile: Recent Labs  Lab 10/30/17 0259  INR 0.97   Cardiac Enzymes: No results for input(s): CKTOTAL, CKMB, CKMBINDEX, TROPONINI in the last 168 hours. BNP (last 3 results) No results for input(s): PROBNP in the last 8760 hours. HbA1C: No results for input(s): HGBA1C in the last 72 hours. CBG: No results for input(s): GLUCAP in the last 168 hours. Lipid Profile: No results for input(s): CHOL, HDL, LDLCALC, TRIG, CHOLHDL, LDLDIRECT in the last 72 hours. Thyroid Function Tests: No results for input(s): TSH, T4TOTAL, FREET4, T3FREE, THYROIDAB in the last 72 hours. Anemia Panel: No results for input(s): VITAMINB12, FOLATE, FERRITIN, TIBC, IRON, RETICCTPCT in the last 72 hours. Sepsis Labs: Recent Labs  Lab 10/27/17 0142  LATICACIDVEN 1.50    Recent Results (from the past 240 hour(s))  Urine C&S     Status: Abnormal   Collection Time: 10/26/17 11:11 PM  Result Value Ref Range Status   Specimen Description URINE, CATHETERIZED  Final   Special Requests Immunocompromised  Final   Culture >=100,000 COLONIES/mL ESCHERICHIA COLI (A)  Final   Report Status 10/29/2017 FINAL  Final   Organism ID, Bacteria ESCHERICHIA COLI (A)  Final      Susceptibility   Escherichia coli - MIC*    AMPICILLIN >=32 RESISTANT Resistant      CEFAZOLIN <=4 SENSITIVE Sensitive     CEFTRIAXONE <=1 SENSITIVE Sensitive     CIPROFLOXACIN >=4 RESISTANT Resistant     GENTAMICIN <=1 SENSITIVE Sensitive     IMIPENEM <=0.25 SENSITIVE Sensitive     NITROFURANTOIN <=16 SENSITIVE Sensitive     TRIMETH/SULFA >=320 RESISTANT Resistant     AMPICILLIN/SULBACTAM 8 SENSITIVE Sensitive     PIP/TAZO <=4 SENSITIVE Sensitive     Extended ESBL NEGATIVE Sensitive     * >=100,000 COLONIES/mL ESCHERICHIA COLI  Blood Culture (routine x 2)     Status: Abnormal   Collection Time: 10/27/17  1:29 AM  Result Value Ref Range Status   Specimen Description   Final    BLOOD RIGHT HAND Performed at [facility] [address] [place], [place] [address]    Special Requests   Final    IN PEDIATRIC BOTTLE Blood Culture adequate volume Performed at [facility] [address] [place], [place] [address]    Culture  Setup Time   Final    GRAM NEGATIVE  RODS IN PEDIATRIC BOTTLE CRITICAL VALUE NOTED.  VALUE IS CONSISTENT WITH PREVIOUSLY REPORTED AND CALLED VALUE.    Culture (A)  Final    ESCHERICHIA COLI SUSCEPTIBILITIES PERFORMED ON PREVIOUS CULTURE WITHIN THE LAST 5 DAYS. Performed at [facility], [place] [address], [place], [place] [address]    Report Status 10/30/2017 FINAL  Final  Blood Culture (routine x 2)     Status: Abnormal   Collection Time: 10/27/17  1:29 AM  Result Value Ref Range Status   Specimen Description BLOOD LEFT HAND  Final   Special Requests IN PEDIATRIC BOTTLE Blood Culture adequate volume  Final   Culture  Setup Time   Final    GRAM NEGATIVE RODS IN PEDIATRIC BOTTLE CRITICAL RESULT CALLED TO, READ BACK BY AND VERIFIED WITH: Otila Back [id number] 2032 MLM Performed at [facility], [place] [address], [place], [place] [address]    Culture ESCHERICHIA COLI (A)  Final   Report Status 10/29/2017 FINAL  Final   Organism ID, Bacteria ESCHERICHIA COLI  Final      Susceptibility    Escherichia coli - MIC*    AMPICILLIN >=32 RESISTANT Resistant     CEFAZOLIN <=4 SENSITIVE Sensitive     CEFEPIME <=1 SENSITIVE Sensitive     CEFTAZIDIME <=1 SENSITIVE Sensitive     CEFTRIAXONE <=1 SENSITIVE Sensitive     CIPROFLOXACIN >=4 RESISTANT Resistant     GENTAMICIN <=1 SENSITIVE Sensitive     IMIPENEM <=0.25 SENSITIVE Sensitive     TRIMETH/SULFA >=320 RESISTANT Resistant     AMPICILLIN/SULBACTAM 16 INTERMEDIATE Intermediate     PIP/TAZO <=4 SENSITIVE Sensitive     Extended ESBL NEGATIVE Sensitive     * ESCHERICHIA COLI  Blood Culture ID Panel (Reflexed)     Status: Abnormal   Collection Time: 10/27/17  1:29 AM  Result Value Ref Range Status   Enterococcus species NOT DETECTED NOT DETECTED Final   Listeria monocytogenes NOT DETECTED NOT DETECTED Final   Staphylococcus species NOT DETECTED NOT DETECTED Final   Staphylococcus aureus NOT DETECTED NOT DETECTED Final   Streptococcus species NOT DETECTED NOT DETECTED Final   Streptococcus agalactiae NOT DETECTED NOT DETECTED Final   Streptococcus pneumoniae NOT DETECTED NOT DETECTED Final   Streptococcus pyogenes NOT DETECTED NOT DETECTED Final   Acinetobacter baumannii NOT DETECTED NOT DETECTED Final   Enterobacteriaceae species DETECTED (A) NOT DETECTED Final    Comment: Enterobacteriaceae represent a large family of gram-negative bacteria, not a single organism. CRITICAL RESULT CALLED TO, READ BACK BY AND VERIFIED WITH: PJARMD J LEGGE [id number] 2032 MLM    Enterobacter cloacae complex NOT DETECTED NOT DETECTED Final   Escherichia coli DETECTED (A) NOT DETECTED Final    Comment: CRITICAL RESULT CALLED TO, READ BACK BY AND VERIFIED WITH: PHARMD J LEGGE [id number] 2032 MLM    Klebsiella oxytoca NOT DETECTED NOT DETECTED Final   Klebsiella pneumoniae NOT DETECTED NOT DETECTED Final   Proteus species NOT DETECTED NOT DETECTED Final   Serratia marcescens NOT DETECTED NOT DETECTED Final   Carbapenem resistance NOT DETECTED NOT DETECTED  Final   Haemophilus influenzae NOT DETECTED NOT DETECTED Final   Neisseria meningitidis NOT DETECTED NOT DETECTED Final   Pseudomonas aeruginosa NOT DETECTED NOT DETECTED Final   Candida albicans NOT DETECTED NOT DETECTED Final   Candida glabrata NOT DETECTED NOT DETECTED Final   Candida krusei NOT DETECTED NOT DETECTED Final   Candida parapsilosis NOT DETECTED NOT DETECTED Final   Candida tropicalis NOT  DETECTED NOT DETECTED Final    Comment: Performed at [facility], [place] [address], [place], [place] [address]  Aerobic/Anaerobic Culture (surgical/deep wound)     Status: None (Preliminary result)   Collection Time: 10/30/17  1:43 PM  Result Value Ref Range Status   Specimen Description   Final    PELVIS Performed at [facility] [address], [place], [place] [address]    Special Requests   Final    NONE Performed at [facility], [place] [name], [place], [place] [address]    Gram Stain NO WBC SEEN NO ORGANISMS SEEN   Final   Culture   Final    NO GROWTH 2 DAYS NO ANAEROBES ISOLATED; CULTURE IN PROGRESS FOR 5 DAYS Performed at [facility] [address] [place], [place] [address]    Report Status PENDING  Incomplete         Radiology Studies: No results found.      Scheduled Meds: . cefpodoxime  400 mg Oral Q12H  . DULoxetine  60 mg Oral QPM  . methadone  10 mg Oral Q8H  . multivitamin with minerals  1 tablet Oral Daily  . potassium chloride  10 mEq Oral BID  . Rivaroxaban  15 mg Oral BID WC  . [START ON 11/21/2017] rivaroxaban  20 mg Oral Q supper   Continuous Infusions:   LOS: 6 days     Georgette Shell, MD [facility]  If 7PM-7AM, please contact night-coverage [email or web address] Password [email or web address] 11/02/2017, 10:46 AM

## 2017-11-02 NOTE — Progress Notes (Signed)
IP PROGRESS NOTE  Subjective:   Carolyn Barton went CT-guided aspiration of the cystic pelvic mass on 10/30/2017.  She reports marked improvement in the left leg edema and pain following this procedure.  Her pain is now under good control with methadone and MSIR. She remained hospitalized over the weekend after developing gross hematuria.  This has improved.  She is scheduled for discharge today.  The Foley catheter remains in place.  Objective: Vital signs in last 24 hours: Blood pressure 116/72, pulse 97, temperature 98.8 F (37.1 C), temperature source Oral, resp. rate 18, height _0  (1.651 m), weight 141 lb 5 oz (64.1 kg), SpO2 100 %.  Intake/Output from previous day: 02/03 0701 - 02/04 0700 In: -  Out: 3450 [Urine:3450]  Physical Exam:  HEENT: No thrush Cardiac: Regular rate and rhythm Lungs: Decreased breath sounds with inspiratory rhonchi at the lower posterior chest bilaterally, no respiratory distress Abdomen: Left lower quadrant colostomy, soft and nontender  Extremities: No leg edema  Portacath/PICC-without erythema  Lab Results: Recent Labs    11/01/17 1355 11/02/17 0907  WBC 6.3 6.7  HGB 10.5* 10.9*  HCT 31.6* 33.7*  PLT 211 306    BMET Recent Labs    11/01/17 0320 11/02/17 0907  NA 140 136  K 3.3* 3.5  CL 105 100*  CO2 30 31  GLUCOSE 99 122*  BUN <5* <5*  CREATININE 0.34* 0.42*  CALCIUM 7.7* 7.9*    Lab Results  Component Value Date   CEA1 1.32 10/12/2017    Studies/Results: No results found.  Medications: I have reviewed the patient's current medications.  Assessment/Plan:  1.Metastatic colorectal cancer-status post an exploratory laparotomy 03/03/2016 revealing a proximal rectal mass, peritoneal carcinomatosis, and liver metastases  Biopsy of peritoneal nodules 03/03/2016 confirmed metastatic adenocarcinoma consistent with a colon primary  Foundation 1 testing-MSI-stable, tumor mutation burden-low, no BRAF NRAS or KRAS  mutation  Cycle 1 FOLFIRI/PANITUMUMAB 04/14/2016  Cycle 2 FOLFIRI/panitumumab 04/28/2016  Cycle 3 FOLFIRI/panitumumab 05/12/2016  Cycle 4 FOLFIRI/panitumumab 05/27/2016  Cycle 5 FOLFIRI/panitumumab 06/09/2016  Restaging CT abdomen/pelvis 06/20/2016-no evidence of disease progression, decreased left hepatic lesion  Cycle 6 FOLFIRI/panitumumab 06/23/2016  Cycle 7 FOLFIRI/panitumumab 07/07/2016  Cycle 8 FOLFIRI/panitumumab 07/21/2016  Cycle 9 FOLFIRI/PANITUMUMAB 08/04/2016  Cycle 10 FOLFIRI/panitumumab 08/18/2016  Restaging CT 08/29/2016 with interval decrease in the size of the medial segment left liver lesion. Lesion identified previously in the rectosigmoid colon not evident on the current study.  5-FU/PANITUMUMAB 09/01/2016  Xeloda 7 days on/7 days off and PANITUMUMAB every 3 weeks 09/15/2016 (awaiting insurance approval for Xeloda 09/15/2016)  CT abdomen/pelvis 12/25/2016-unchanged 5 mm right hepatic lesion, resolution of medial segment left liver lesion, no new liver lesion. Residual soft tissue fullness in the sigmoid , persistent distal esophageal wall thickening  Continuationof PANITUMUMAB every 3 weeks and Xeloda 7 days on/7 days off  CT 06/19/2017-new cystic/solid lesion in the left adnexa  Xeloda/panitumumab continued  CT 10/08/2016-progression of cystic pelvic mass  Cycle 1 FOLFIRI/Panitumumab 10/21/2017  CT aspiration of cystic pelvic mass 10/30/2017  2.History of aBowel obstruction secondary to #1  3. Chronic back pain  4. Essential thrombocytosis  5. Early obstruction of the left ureter noted at the time of surgery 03/03/2016-Dr. Tannenbaum placed a left double-J stent 03/18/2016  New onset right hydronephrosis 10/11/2017  Right ureter stent placement 10/13/2017  6. Abdominal wall cellulitis 03/10/2016, blood cultures positive for coagulase negative staphylococcus-methicillin-resistant, status post treatment with vancomycin and  Bactrim   8. Port-A-Cath placement 04/10/2016, interventional radiology  9.  Iron deficiency anemia. Feraheme 11/05/2015 , 11/12/2015,and 04/14/2016-persistent anemia and red cell microcytosis, oral iron resumed 11/17/2016-stable  10. Pain/tenderness left posterior iliac region-resolved  11. Hypokalemia-started on potassium replacement 06/09/2016  12.  History of skin rashand paronychiasecondary to [facility], she continues minocycline, moisturizers, and fluticasone as needed  13.Acute onset right low back pain 10/11/2017-likely secondary to obstruction of the right kidney;left ureter stent exchange and right ureter stent placement 10/13/2017  14.Left leg pain and edema-bilateral lower extremity venous Doppler negative for DVT 10/13/2017 and 10/19/2017. I suspect she has a pelvic DVT  15.  Admission 10/22/2017 with bilateral pulmonary embolism, heparin drip initiated, converted to Lovenox 10/23/2017  16.  Admission 10/27/2017 with increased back/leg pain and a fever-blood and urine cultures positive for a resistant E. coli   Carolyn Barton appears well today.  She underwent aspiration of the pelvic mass 10/30/2017.  The left leg edema has resolved and her pain appears significantly improved.  She is now maintained on Xarelto anticoagulation. She will contact urology to discuss removal of the Foley catheter.  Carolyn Barton is scheduled for the next cycle of FOLFIRI chemotherapy 11/04/2017.  She would like to delay the cycle of chemotherapy until 11/09/2017 so that she can spend a few days at home.   Recommendations: 1.  Continue antibiotics for the E. coli bacteremia 2.  Xarelto anticoagulation for the DVT/pulmonary emboli 3.  T new methadone and MSIR for pain 4.  Outpatient follow-up will be scheduled at the Cancer center for an office visit and chemotherapy 11/09/2017.       LOS: 6 days   [name] Coder, MD   11/02/2017, 1:47 PM

## 2017-11-03 ENCOUNTER — Other Ambulatory Visit: Payer: Self-pay | Admitting: Urology

## 2017-11-03 ENCOUNTER — Telehealth: Payer: Self-pay | Admitting: Oncology

## 2017-11-03 NOTE — Telephone Encounter (Signed)
Scheduled appt per 2/4 sch message - Patient is aware of appt date  And time.

## 2017-11-04 ENCOUNTER — Ambulatory Visit: Payer: BLUE CROSS/BLUE SHIELD

## 2017-11-04 LAB — AEROBIC/ANAEROBIC CULTURE W GRAM STAIN (SURGICAL/DEEP WOUND): Gram Stain: NONE SEEN

## 2017-11-04 LAB — AEROBIC/ANAEROBIC CULTURE (SURGICAL/DEEP WOUND): CULTURE: NO GROWTH

## 2017-11-09 ENCOUNTER — Inpatient Hospital Stay (HOSPITAL_BASED_OUTPATIENT_CLINIC_OR_DEPARTMENT_OTHER): Payer: BLUE CROSS/BLUE SHIELD | Admitting: Oncology

## 2017-11-09 ENCOUNTER — Inpatient Hospital Stay: Payer: BLUE CROSS/BLUE SHIELD | Attending: Oncology

## 2017-11-09 ENCOUNTER — Telehealth: Payer: Self-pay | Admitting: Oncology

## 2017-11-09 ENCOUNTER — Inpatient Hospital Stay: Payer: BLUE CROSS/BLUE SHIELD

## 2017-11-09 VITALS — BP 129/80 | HR 99 | Temp 97.8°F | Resp 17 | Ht 65.0 in | Wt 127.0 lb

## 2017-11-09 DIAGNOSIS — C20 Malignant neoplasm of rectum: Secondary | ICD-10-CM | POA: Diagnosis not present

## 2017-11-09 DIAGNOSIS — I2699 Other pulmonary embolism without acute cor pulmonale: Secondary | ICD-10-CM | POA: Diagnosis not present

## 2017-11-09 DIAGNOSIS — C19 Malignant neoplasm of rectosigmoid junction: Secondary | ICD-10-CM

## 2017-11-09 DIAGNOSIS — Z7901 Long term (current) use of anticoagulants: Secondary | ICD-10-CM | POA: Diagnosis not present

## 2017-11-09 DIAGNOSIS — Z5112 Encounter for antineoplastic immunotherapy: Secondary | ICD-10-CM | POA: Diagnosis present

## 2017-11-09 DIAGNOSIS — C786 Secondary malignant neoplasm of retroperitoneum and peritoneum: Secondary | ICD-10-CM | POA: Diagnosis not present

## 2017-11-09 DIAGNOSIS — C787 Secondary malignant neoplasm of liver and intrahepatic bile duct: Secondary | ICD-10-CM

## 2017-11-09 DIAGNOSIS — D509 Iron deficiency anemia, unspecified: Secondary | ICD-10-CM

## 2017-11-09 DIAGNOSIS — M545 Low back pain: Secondary | ICD-10-CM | POA: Diagnosis not present

## 2017-11-09 DIAGNOSIS — R600 Localized edema: Secondary | ICD-10-CM

## 2017-11-09 DIAGNOSIS — Z452 Encounter for adjustment and management of vascular access device: Secondary | ICD-10-CM | POA: Diagnosis not present

## 2017-11-09 DIAGNOSIS — Z5111 Encounter for antineoplastic chemotherapy: Secondary | ICD-10-CM | POA: Insufficient documentation

## 2017-11-09 LAB — CMP (CANCER CENTER ONLY)
ALT: 9 U/L (ref 0–55)
AST: 13 U/L (ref 5–34)
Albumin: 2.8 g/dL — ABNORMAL LOW (ref 3.5–5.0)
Alkaline Phosphatase: 90 U/L (ref 40–150)
Anion gap: 9 (ref 3–11)
BUN: 11 mg/dL (ref 7–26)
CO2: 27 mmol/L (ref 22–29)
Calcium: 9 mg/dL (ref 8.4–10.4)
Chloride: 99 mmol/L (ref 98–109)
Creatinine: 0.62 mg/dL (ref 0.60–1.10)
GFR, Est AFR Am: >60 mL/min
GFR, Estimated: >60 mL/min
Glucose, Bld: 108 mg/dL (ref 70–140)
Potassium: 4 mmol/L (ref 3.5–5.1)
Sodium: 135 mmol/L — ABNORMAL LOW (ref 136–145)
Total Bilirubin: 0.4 mg/dL (ref 0.2–1.2)
Total Protein: 6.2 g/dL — ABNORMAL LOW (ref 6.4–8.3)

## 2017-11-09 LAB — CBC WITH DIFFERENTIAL (CANCER CENTER ONLY)
Basophils Absolute: 0.2 K/uL — ABNORMAL HIGH (ref 0.0–0.1)
Basophils Relative: 2 %
Eosinophils Absolute: 0.4 K/uL (ref 0.0–0.5)
Eosinophils Relative: 4 %
HCT: 36.8 % (ref 34.8–46.6)
Hemoglobin: 11.8 g/dL (ref 11.6–15.9)
Lymphocytes Relative: 11 %
Lymphs Abs: 1.2 K/uL (ref 0.9–3.3)
MCH: 28.8 pg (ref 25.1–34.0)
MCHC: 32.1 g/dL (ref 31.5–36.0)
MCV: 89.8 fL (ref 79.5–101.0)
Monocytes Absolute: 0.8 K/uL (ref 0.1–0.9)
Monocytes Relative: 7 %
Neutro Abs: 8.3 K/uL — ABNORMAL HIGH (ref 1.5–6.5)
Neutrophils Relative %: 76 %
Platelet Count: 769 K/uL — ABNORMAL HIGH (ref 145–400)
RBC: 4.1 MIL/uL (ref 3.70–5.45)
RDW: 16.6 % — ABNORMAL HIGH (ref 11.2–14.5)
WBC Count: 10.9 K/uL — ABNORMAL HIGH (ref 3.9–10.3)

## 2017-11-09 LAB — CEA (IN HOUSE-CHCC): CEA (CHCC-In House): 3.44 ng/mL (ref 0.00–5.00)

## 2017-11-09 MED ORDER — LEUCOVORIN CALCIUM INJECTION 350 MG
400.0000 mg/m2 | Freq: Once | INTRAVENOUS | Status: AC
  Administered 2017-11-09: 652 mg via INTRAVENOUS
  Filled 2017-11-09: qty 32.6

## 2017-11-09 MED ORDER — PALONOSETRON HCL INJECTION 0.25 MG/5ML
0.2500 mg | Freq: Once | INTRAVENOUS | Status: AC
  Administered 2017-11-09: 0.25 mg via INTRAVENOUS

## 2017-11-09 MED ORDER — DEXAMETHASONE SODIUM PHOSPHATE 10 MG/ML IJ SOLN
10.0000 mg | Freq: Once | INTRAMUSCULAR | Status: AC
  Administered 2017-11-09: 10 mg via INTRAVENOUS

## 2017-11-09 MED ORDER — SODIUM CHLORIDE 0.9 % IV SOLN
Freq: Once | INTRAVENOUS | Status: AC
  Administered 2017-11-09: 11:00:00 via INTRAVENOUS

## 2017-11-09 MED ORDER — DEXAMETHASONE SODIUM PHOSPHATE 10 MG/ML IJ SOLN
INTRAMUSCULAR | Status: AC
  Filled 2017-11-09: qty 1

## 2017-11-09 MED ORDER — ATROPINE SULFATE 1 MG/ML IJ SOLN
0.5000 mg | Freq: Once | INTRAMUSCULAR | Status: AC | PRN
  Administered 2017-11-09: 0.5 mg via INTRAVENOUS

## 2017-11-09 MED ORDER — PANITUMUMAB CHEMO INJECTION 100 MG/5ML
5.8000 mg/kg | Freq: Once | INTRAVENOUS | Status: AC
  Administered 2017-11-09: 300 mg via INTRAVENOUS
  Filled 2017-11-09: qty 15

## 2017-11-09 MED ORDER — FLUOROURACIL CHEMO INJECTION 5 GM/100ML
2400.0000 mg/m2 | INTRAVENOUS | Status: DC
  Administered 2017-11-09: 3900 mg via INTRAVENOUS
  Filled 2017-11-09: qty 78

## 2017-11-09 MED ORDER — FLUOROURACIL CHEMO INJECTION 2.5 GM/50ML
400.0000 mg/m2 | Freq: Once | INTRAVENOUS | Status: AC
  Administered 2017-11-09: 650 mg via INTRAVENOUS
  Filled 2017-11-09: qty 13

## 2017-11-09 MED ORDER — PALONOSETRON HCL INJECTION 0.25 MG/5ML
INTRAVENOUS | Status: AC
  Filled 2017-11-09: qty 5

## 2017-11-09 MED ORDER — ATROPINE SULFATE 1 MG/ML IJ SOLN
INTRAMUSCULAR | Status: AC
  Filled 2017-11-09: qty 1

## 2017-11-09 MED ORDER — IRINOTECAN HCL CHEMO INJECTION 100 MG/5ML
180.0000 mg/m2 | Freq: Once | INTRAVENOUS | Status: AC
  Administered 2017-11-09: 300 mg via INTRAVENOUS
  Filled 2017-11-09: qty 15

## 2017-11-09 NOTE — Telephone Encounter (Signed)
Gave avs and calendar for February and march °

## 2017-11-09 NOTE — Patient Instructions (Signed)
[facility] Discharge Instructions for Patients Receiving Chemotherapy  Today you received the following chemotherapy agents:  Vectibix, Irinotecan, Leucovorin, and 5FU.  To help prevent nausea and vomiting after your treatment, we encourage you to take your nausea medication as directed.   If you develop nausea and vomiting that is not controlled by your nausea medication, call the clinic.   BELOW ARE SYMPTOMS THAT SHOULD BE REPORTED IMMEDIATELY:  *FEVER GREATER THAN 100.5 F  *CHILLS WITH OR WITHOUT FEVER  NAUSEA AND VOMITING THAT IS NOT CONTROLLED WITH YOUR NAUSEA MEDICATION  *UNUSUAL SHORTNESS OF BREATH  *UNUSUAL BRUISING OR BLEEDING  TENDERNESS IN MOUTH AND THROAT WITH OR WITHOUT PRESENCE OF ULCERS  *URINARY PROBLEMS  *BOWEL PROBLEMS  UNUSUAL RASH Items with * indicate a potential emergency and should be followed up as soon as possible.  Feel free to call the clinic should you have any questions or concerns. The clinic phone number is [phone].  Please show the CHEMO ALERT CARD at check-in to the Emergency Department and triage nurse.   

## 2017-11-09 NOTE — Progress Notes (Signed)
[place] OFFICE PROGRESS NOTE   Diagnosis: Colon cancer  INTERVAL HISTORY:   Carolyn Barton returns as scheduled.  She was discharged from the hospital 11/02/2017 after admission with E. coli bacteremia.  She completed an outpatient course of antibiotics.  She reports the Foley catheter has been removed. The left leg pain is much improved since undergoing drainage of the cystic pelvic fluid collection.  Mild edema persists in the left leg.  The lower back pain is also improved. She reports a good appetite.  She is having bowel movements.  She had recent episodes of nausea, unrelieved with Compazine.  Objective:  Vital signs in last 24 hours:  Blood pressure 129/80, pulse 99, temperature 97.8 F (36.6 C), temperature source Oral, resp. rate 17, height '5\' 5"'$  (1.651 m), weight 127 lb (57.6 kg), SpO2 100 %.    HEENT: No thrush or ulcers Resp: Lungs clear bilaterally Cardio: Regular rate and rhythm GI: No hepatomegaly, left lower quadrant colostomy, slight fullness in the low abdomen Vascular: Trace edema throughout the left leg   Portacath/PICC-without erythema  Lab Results:  Lab Results  Component Value Date   WBC 10.9 (H) 11/09/2017   HGB 10.9 (L) 11/02/2017   HCT 36.8 11/09/2017   MCV 89.8 11/09/2017   PLT 769 (H) 11/09/2017   NEUTROABS 8.3 (H) 11/09/2017    CMP     Component Value Date/Time   NA 135 (L) 11/09/2017 0902   NA 139 09/21/2017 0746   K 4.0 11/09/2017 0902   K 3.3 (L) 09/21/2017 0746   CL 99 11/09/2017 0902   CL 101 12/01/2012 0924   CO2 27 11/09/2017 0902   CO2 28 09/21/2017 0746   GLUCOSE 108 11/09/2017 0902   GLUCOSE 92 09/21/2017 0746   GLUCOSE 94 12/01/2012 0924   BUN 11 11/09/2017 0902   BUN 9.3 09/21/2017 0746   CREATININE 0.62 11/09/2017 0902   CREATININE 0.6 09/21/2017 0746   CALCIUM 9.0 11/09/2017 0902   CALCIUM 8.7 09/21/2017 0746   PROT 6.2 (L) 11/09/2017 0902   PROT 6.2 (L) 09/21/2017 0746   ALBUMIN 2.8 (L) 11/09/2017  0902   ALBUMIN 3.2 (L) 09/21/2017 0746   AST 13 11/09/2017 0902   AST 19 09/21/2017 0746   ALT 9 11/09/2017 0902   ALT 12 09/21/2017 0746   ALKPHOS 90 11/09/2017 0902   ALKPHOS 105 09/21/2017 0746   BILITOT 0.4 11/09/2017 0902   BILITOT 0.41 09/21/2017 0746   GFRNONAA >60 11/09/2017 0902   GFRAA >60 11/09/2017 0902    Lab Results  Component Value Date   CEA1 1.32 10/12/2017    Medications: I have reviewed the patient's current medications.   Assessment/Plan: 1.Metastatic colorectal cancer-status post an exploratory laparotomy 03/03/2016 revealing a proximal rectal mass, peritoneal carcinomatosis, and liver metastases  Biopsy of peritoneal nodules 03/03/2016 confirmed metastatic adenocarcinoma consistent with a colon primary  Foundation 1 testing-MSI-stable, tumor mutation burden-low, no BRAF NRAS or KRAS mutation  Cycle 1 FOLFIRI/PANITUMUMAB 04/14/2016  Cycle 2 FOLFIRI/panitumumab 04/28/2016  Cycle 3 FOLFIRI/panitumumab 05/12/2016  Cycle 4 FOLFIRI/panitumumab 05/27/2016  Cycle 5 FOLFIRI/panitumumab 06/09/2016  Restaging CT abdomen/pelvis 06/20/2016-no evidence of disease progression, decreased left hepatic lesion  Cycle 6 FOLFIRI/panitumumab 06/23/2016  Cycle 7 FOLFIRI/panitumumab 07/07/2016  Cycle 8 FOLFIRI/panitumumab 07/21/2016  Cycle 9 FOLFIRI/PANITUMUMAB 08/04/2016  Cycle 10 FOLFIRI/panitumumab 08/18/2016  Restaging CT 08/29/2016 with interval decrease in the size of the medial segment left liver lesion. Lesion identified previously in the rectosigmoid colon not evident on the current study.  5-FU/PANITUMUMAB 09/01/2016  Xeloda 7 days on/7 days off and PANITUMUMAB every 3 weeks 09/15/2016 (awaiting insurance approval for Xeloda 09/15/2016)  CT abdomen/pelvis 12/25/2016-unchanged 5 mm right hepatic lesion, resolution of medial segment left liver lesion, no new liver lesion. Residual soft tissue fullness in the sigmoid , persistent distal esophageal wall  thickening  Continuationof PANITUMUMAB every 3 weeks and Xeloda 7 days on/7 days off  CT 06/19/2017-new cystic/solid lesion in the left adnexa  Xeloda/panitumumab continued  CT 10/08/2016-progression of cystic pelvic mass  Cycle 1 FOLFIRI/Panitumumab 10/21/2017  Cystic pelvic mass aspirated 10/30/2017  Cycle 2 FOLFIRI/panitumumab 11/09/2017  2.History of aBowel obstruction secondary to #1  3. Chronic back pain-maintained on methadone  4. Essential thrombocytosis  5. Early obstruction of the left ureter noted at the time of surgery 03/03/2016-Dr. Tannenbaum placed a left double-J stent 03/18/2016  New onset right hydronephrosis 10/11/2017  Right ureter stent placement 10/13/2017  6. Abdominal wall cellulitis 03/10/2016, blood cultures positive for coagulase negative staphylococcus-methicillin-resistant, status post treatment with vancomycin and Bactrim   8. Port-A-Cath placement 04/10/2016, interventional radiology  9. Iron deficiency anemia. Feraheme 11/05/2015 , 11/12/2015,and 04/14/2016-persistent anemia and red cell microcytosis, oral iron resumed 11/17/2016-stable  10. Pain/tenderness left posterior iliac region-resolved  11. Hypokalemia-started on potassium replacement 06/09/2016  12.  History of skin rashand paronychiasecondary to [facility], she continues minocycline, moisturizers, and fluticasone as needed  13.Acute onset right low back pain 10/11/2017-likely secondary to obstruction of the right kidney;left ureter stent exchange and right ureter stent placement 10/13/2017  14.Left leg pain and edema-bilateral lower extremity venous Doppler negative for DVT1/15/2019 and 10/19/2017. I suspect she has a pelvic DVT  15. Admission 10/22/2017 with bilateral pulmonary embolism, heparin drip initiated, converted to Lovenox 10/23/2017, converted to Xarelto during the 10/27/2017 hospital admission  16.  Admission 10/27/2017 with increased  back/leg pain and a fever-blood and urine cultures positive for a resistant E. coli  Disposition: Her overall status appears improved compared to the hospital admission 10/27/2017.  The left leg pain and edema improved after aspiration of the pelvic mass 10/30/2017.  The plan is to continue FOLFIRI/panitumumab chemotherapy beginning with a second cycle today.  She will undergo a restaging CT evaluation after 4-5 cycles.  We will consider switching to oxalic platinum based therapy if there is no response to the FOLFIRI/panitumumab.  Carolyn Barton will contact us for nausea that is not relieved with Zofran or Compazine.  She will continue Xarelto anticoagulation.  She will return for an office visit and chemotherapy in 2 weeks.  25 minutes were spent with the patient today.  The majority of the time was used for counseling and coordination of care.  [name] Coder, MD  11/09/2017  10:25 AM

## 2017-11-09 NOTE — Progress Notes (Signed)
During irinotecan/leucovorin infusion, patient began c/o nausea. Infusions paused, atropine administered, patient quickly returned to baseline, and infusions resumed without further incident.

## 2017-11-09 NOTE — Addendum Note (Signed)
Addended by: [name] Coder B on: 11/09/2017 10:43 AM   Modules accepted: Orders

## 2017-11-11 ENCOUNTER — Inpatient Hospital Stay: Payer: BLUE CROSS/BLUE SHIELD

## 2017-11-11 VITALS — BP 115/80 | HR 95 | Temp 98.0°F | Resp 20

## 2017-11-11 DIAGNOSIS — C20 Malignant neoplasm of rectum: Secondary | ICD-10-CM

## 2017-11-11 MED ORDER — SODIUM CHLORIDE 0.9% FLUSH
10.0000 mL | INTRAVENOUS | Status: DC | PRN
  Administered 2017-11-11: 10 mL
  Filled 2017-11-11: qty 10

## 2017-11-11 MED ORDER — HEPARIN SOD (PORK) LOCK FLUSH 100 UNIT/ML IV SOLN
500.0000 [IU] | Freq: Once | INTRAVENOUS | Status: AC | PRN
  Administered 2017-11-11: 500 [IU]
  Filled 2017-11-11: qty 5

## 2017-11-11 NOTE — Patient Instructions (Signed)
Implanted Port Home Guide An implanted port is a type of central line that is placed under the skin. Central lines are used to provide IV access when treatment or nutrition needs to be given through a person's veins. Implanted ports are used for long-term IV access. An implanted port may be placed because:  You need IV medicine that would be irritating to the small veins in your hands or arms.  You need long-term IV medicines, such as antibiotics.  You need IV nutrition for a long period.  You need frequent blood draws for lab tests.  You need dialysis.  Implanted ports are usually placed in the chest area, but they can also be placed in the upper arm, the abdomen, or the leg. An implanted port has two main parts:  Reservoir. The reservoir is round and will appear as a small, raised area under your skin. The reservoir is the part where a needle is inserted to give medicines or draw blood.  Catheter. The catheter is a thin, flexible tube that extends from the reservoir. The catheter is placed into a large vein. Medicine that is inserted into the reservoir goes into the catheter and then into the vein.  How will I care for my incision site? Do not get the incision site wet. Bathe or shower as directed by your health care provider. How is my port accessed? Special steps must be taken to access the port:  Before the port is accessed, a numbing cream can be placed on the skin. This helps numb the skin over the port site.  Your health care provider uses a sterile technique to access the port. ? Your health care provider must put on a mask and sterile gloves. ? The skin over your port is cleaned carefully with an antiseptic and allowed to dry. ? The port is gently pinched between sterile gloves, and a needle is inserted into the port.  Only "non-coring" port needles should be used to access the port. Once the port is accessed, a blood return should be checked. This helps ensure that the port  is in the vein and is not clogged.  If your port needs to remain accessed for a constant infusion, a clear (transparent) bandage will be placed over the needle site. The bandage and needle will need to be changed every week, or as directed by your health care provider.  Keep the bandage covering the needle clean and dry. Do not get it wet. Follow your health care provider's instructions on how to take a shower or bath while the port is accessed.  If your port does not need to stay accessed, no bandage is needed over the port.  What is flushing? Flushing helps keep the port from getting clogged. Follow your health care provider's instructions on how and when to flush the port. Ports are usually flushed with saline solution or a medicine called heparin. The need for flushing will depend on how the port is used.  If the port is used for intermittent medicines or blood draws, the port will need to be flushed: ? After medicines have been given. ? After blood has been drawn. ? As part of routine maintenance.  If a constant infusion is running, the port may not need to be flushed.  How long will my port stay implanted? The port can stay in for as long as your health care provider thinks it is needed. When it is time for the port to come out, surgery will be   done to remove it. The procedure is similar to the one performed when the port was put in. When should I seek immediate medical care? When you have an implanted port, you should seek immediate medical care if:  You notice a bad smell coming from the incision site.  You have swelling, redness, or drainage at the incision site.  You have more swelling or pain at the port site or the surrounding area.  You have a fever that is not controlled with medicine.  This information is not intended to replace advice given to you by your health care provider. Make sure you discuss any questions you have with your health care provider. Document  Released: 09/15/2005 Document Revised: 02/21/2016 Document Reviewed: 05/23/2013 Elsevier Interactive Patient Education  2017 [employer]  

## 2017-11-12 ENCOUNTER — Ambulatory Visit: Payer: BLUE CROSS/BLUE SHIELD

## 2017-11-12 ENCOUNTER — Other Ambulatory Visit: Payer: BLUE CROSS/BLUE SHIELD

## 2017-11-12 ENCOUNTER — Ambulatory Visit: Payer: BLUE CROSS/BLUE SHIELD | Admitting: Oncology

## 2017-11-13 ENCOUNTER — Encounter: Payer: Self-pay | Admitting: General Practice

## 2017-11-13 NOTE — Progress Notes (Signed)
[facility] Spiritual Care Note  Attempted f/u call, but voicemail full. Will f/u on campus.   [place], [place], [facility] Pager [phone] Voicemail [phone]

## 2017-11-16 ENCOUNTER — Other Ambulatory Visit: Payer: BLUE CROSS/BLUE SHIELD

## 2017-11-16 ENCOUNTER — Ambulatory Visit: Payer: BLUE CROSS/BLUE SHIELD | Admitting: Nurse Practitioner

## 2017-11-16 ENCOUNTER — Ambulatory Visit: Payer: BLUE CROSS/BLUE SHIELD

## 2017-11-22 ENCOUNTER — Other Ambulatory Visit: Payer: Self-pay | Admitting: Oncology

## 2017-11-23 ENCOUNTER — Inpatient Hospital Stay: Payer: BLUE CROSS/BLUE SHIELD

## 2017-11-23 ENCOUNTER — Telehealth: Payer: Self-pay

## 2017-11-23 ENCOUNTER — Encounter: Payer: Self-pay | Admitting: General Practice

## 2017-11-23 ENCOUNTER — Inpatient Hospital Stay (HOSPITAL_BASED_OUTPATIENT_CLINIC_OR_DEPARTMENT_OTHER): Payer: BLUE CROSS/BLUE SHIELD | Admitting: Oncology

## 2017-11-23 ENCOUNTER — Encounter: Payer: Self-pay | Admitting: Oncology

## 2017-11-23 VITALS — BP 123/75 | HR 98 | Temp 98.1°F | Resp 18 | Ht 65.0 in | Wt 135.1 lb

## 2017-11-23 DIAGNOSIS — I82412 Acute embolism and thrombosis of left femoral vein: Secondary | ICD-10-CM | POA: Diagnosis not present

## 2017-11-23 DIAGNOSIS — C19 Malignant neoplasm of rectosigmoid junction: Secondary | ICD-10-CM

## 2017-11-23 DIAGNOSIS — E8809 Other disorders of plasma-protein metabolism, not elsewhere classified: Secondary | ICD-10-CM

## 2017-11-23 DIAGNOSIS — C20 Malignant neoplasm of rectum: Secondary | ICD-10-CM

## 2017-11-23 DIAGNOSIS — R1900 Intra-abdominal and pelvic swelling, mass and lump, unspecified site: Secondary | ICD-10-CM

## 2017-11-23 DIAGNOSIS — Z933 Colostomy status: Secondary | ICD-10-CM | POA: Diagnosis not present

## 2017-11-23 DIAGNOSIS — C787 Secondary malignant neoplasm of liver and intrahepatic bile duct: Secondary | ICD-10-CM

## 2017-11-23 DIAGNOSIS — D509 Iron deficiency anemia, unspecified: Secondary | ICD-10-CM

## 2017-11-23 DIAGNOSIS — C786 Secondary malignant neoplasm of retroperitoneum and peritoneum: Secondary | ICD-10-CM

## 2017-11-23 DIAGNOSIS — R600 Localized edema: Secondary | ICD-10-CM | POA: Diagnosis not present

## 2017-11-23 DIAGNOSIS — Z95828 Presence of other vascular implants and grafts: Secondary | ICD-10-CM

## 2017-11-23 LAB — CMP (CANCER CENTER ONLY)
ALT: 9 U/L (ref 0–55)
ANION GAP: 7 (ref 3–11)
AST: 13 U/L (ref 5–34)
Albumin: 2.2 g/dL — ABNORMAL LOW (ref 3.5–5.0)
Alkaline Phosphatase: 69 U/L (ref 40–150)
BILIRUBIN TOTAL: 0.2 mg/dL (ref 0.2–1.2)
BUN: 16 mg/dL (ref 7–26)
CALCIUM: 8.4 mg/dL (ref 8.4–10.4)
CO2: 28 mmol/L (ref 22–29)
Chloride: 100 mmol/L (ref 98–109)
Creatinine: 0.57 mg/dL — ABNORMAL LOW (ref 0.60–1.10)
GFR, Estimated: >60 mL/min (ref >60)
Glucose, Bld: 92 mg/dL (ref 70–140)
POTASSIUM: 3.9 mmol/L (ref 3.5–5.1)
SODIUM: 135 mmol/L — AB (ref 136–145)
TOTAL PROTEIN: 4.8 g/dL — AB (ref 6.4–8.3)

## 2017-11-23 LAB — CBC WITH DIFFERENTIAL (CANCER CENTER ONLY)
BASOS PCT: 1 %
Basophils Absolute: 0.1 10*3/uL (ref 0.0–0.1)
EOS ABS: 0.5 10*3/uL (ref 0.0–0.5)
Eosinophils Relative: 7 %
HCT: 33.6 % — ABNORMAL LOW (ref 34.8–46.6)
HEMOGLOBIN: 10.6 g/dL — AB (ref 11.6–15.9)
LYMPHS ABS: 1 10*3/uL (ref 0.9–3.3)
Lymphocytes Relative: 14 %
MCH: 27.9 pg (ref 25.1–34.0)
MCHC: 31.5 g/dL (ref 31.5–36.0)
MCV: 88.4 fL (ref 79.5–101.0)
MONO ABS: 0.7 10*3/uL (ref 0.1–0.9)
MONOS PCT: 9 %
Neutro Abs: 5.1 10*3/uL (ref 1.5–6.5)
Neutrophils Relative %: 69 %
Platelet Count: 506 10*3/uL — ABNORMAL HIGH (ref 145–400)
RBC: 3.8 MIL/uL (ref 3.70–5.45)
RDW: 16.4 % — AB (ref 11.2–14.5)
WBC Count: 7.3 10*3/uL (ref 3.9–10.3)

## 2017-11-23 LAB — MAGNESIUM: MAGNESIUM: 1.6 mg/dL (ref 1.5–2.5)

## 2017-11-23 MED ORDER — SODIUM CHLORIDE 0.9 % IJ SOLN
10.0000 mL | INTRAMUSCULAR | Status: DC | PRN
  Administered 2017-11-23: 10 mL via INTRAVENOUS
  Filled 2017-11-23: qty 10

## 2017-11-23 MED ORDER — ATROPINE SULFATE 1 MG/ML IJ SOLN
INTRAMUSCULAR | Status: AC
  Filled 2017-11-23: qty 1

## 2017-11-23 MED ORDER — PALONOSETRON HCL INJECTION 0.25 MG/5ML
0.2500 mg | Freq: Once | INTRAVENOUS | Status: AC
  Administered 2017-11-23: 0.25 mg via INTRAVENOUS

## 2017-11-23 MED ORDER — DEXAMETHASONE SODIUM PHOSPHATE 10 MG/ML IJ SOLN
10.0000 mg | Freq: Once | INTRAMUSCULAR | Status: AC
  Administered 2017-11-23: 10 mg via INTRAVENOUS

## 2017-11-23 MED ORDER — ATROPINE SULFATE 1 MG/ML IJ SOLN
0.5000 mg | Freq: Once | INTRAMUSCULAR | Status: AC | PRN
  Administered 2017-11-23: 0.5 mg via INTRAVENOUS

## 2017-11-23 MED ORDER — PALONOSETRON HCL INJECTION 0.25 MG/5ML
INTRAVENOUS | Status: AC
Start: 2017-11-23 — End: ?
  Filled 2017-11-23: qty 5

## 2017-11-23 MED ORDER — PANITUMUMAB CHEMO INJECTION 100 MG/5ML
300.0000 mg | Freq: Once | INTRAVENOUS | Status: AC
  Administered 2017-11-23: 300 mg via INTRAVENOUS
  Filled 2017-11-23: qty 15

## 2017-11-23 MED ORDER — DEXAMETHASONE SODIUM PHOSPHATE 10 MG/ML IJ SOLN
INTRAMUSCULAR | Status: AC
  Filled 2017-11-23: qty 1

## 2017-11-23 MED ORDER — DEXTROSE 5 % IV SOLN
400.0000 mg/m2 | Freq: Once | INTRAVENOUS | Status: AC
  Administered 2017-11-23: 652 mg via INTRAVENOUS
  Filled 2017-11-23: qty 32.6

## 2017-11-23 MED ORDER — FLUOROURACIL CHEMO INJECTION 2.5 GM/50ML
400.0000 mg/m2 | Freq: Once | INTRAVENOUS | Status: AC
  Administered 2017-11-23: 650 mg via INTRAVENOUS
  Filled 2017-11-23: qty 13

## 2017-11-23 MED ORDER — IRINOTECAN HCL CHEMO INJECTION 100 MG/5ML
180.0000 mg/m2 | Freq: Once | INTRAVENOUS | Status: AC
  Administered 2017-11-23: 300 mg via INTRAVENOUS
  Filled 2017-11-23: qty 15

## 2017-11-23 MED ORDER — SODIUM CHLORIDE 0.9 % IV SOLN
Freq: Once | INTRAVENOUS | Status: AC
  Administered 2017-11-23: 12:00:00 via INTRAVENOUS

## 2017-11-23 MED ORDER — SODIUM CHLORIDE 0.9 % IV SOLN
2400.0000 mg/m2 | INTRAVENOUS | Status: DC
  Administered 2017-11-23: 3900 mg via INTRAVENOUS
  Filled 2017-11-23: qty 78

## 2017-11-23 NOTE — Telephone Encounter (Signed)
Printed avs and calender of upcoming appointment. Per 2/25 los 

## 2017-11-23 NOTE — Progress Notes (Signed)
[place] OFFICE PROGRESS NOTE   Diagnosis: Colon cancer  INTERVAL HISTORY:   [name] returns as scheduled.  She completed a cycle of FOLFIRI/panitumumab 11/09/2017.  She tolerated the chemotherapy well.  She has mild nausea.  She reports increased leg swelling on the left greater than right.  The pain is adequately controlled with methadone and MSIR.  No bleeding.  Objective:  Vital signs in last 24 hours:  Blood pressure 123/75, pulse 98, temperature 98.1 F (36.7 C), temperature source Oral, resp. rate 18, height _0  (1.651 m), weight 135 lb 1.6 oz (61.3 kg), SpO2 100 %.    HEENT: No thrush or ulcers Resp: Lungs clear bilaterally Cardio: Regular rate and rhythm GI: No hepatomegaly, nondistended, no mass, left lower quadrant colostomy Vascular: 1-2+ edema throughout the left leg, 1+ pitting edema at the right lower leg  Skin: Chronic stasis change with superficial dry desquamation at the lower leg bilaterally  Portacath/PICC-without erythema  Lab Results:  Lab Results  Component Value Date   WBC 7.3 11/23/2017   HGB 10.9 (L) 11/02/2017   HCT 33.6 (L) 11/23/2017   MCV 88.4 11/23/2017   PLT 506 (H) 11/23/2017   NEUTROABS 5.1 11/23/2017    CMP     Component Value Date/Time   NA 135 (L) 11/23/2017 0905   NA 139 09/21/2017 0746   K 3.9 11/23/2017 0905   K 3.3 (L) 09/21/2017 0746   CL 100 11/23/2017 0905   CL 101 12/01/2012 0924   CO2 28 11/23/2017 0905   CO2 28 09/21/2017 0746   GLUCOSE 92 11/23/2017 0905   GLUCOSE 92 09/21/2017 0746   GLUCOSE 94 12/01/2012 0924   BUN 16 11/23/2017 0905   BUN 9.3 09/21/2017 0746   CREATININE 0.57 (L) 11/23/2017 0905   CREATININE 0.6 09/21/2017 0746   CALCIUM 8.4 11/23/2017 0905   CALCIUM 8.7 09/21/2017 0746   PROT 4.8 (L) 11/23/2017 0905   PROT 6.2 (L) 09/21/2017 0746   ALBUMIN 2.2 (L) 11/23/2017 0905   ALBUMIN 3.2 (L) 09/21/2017 0746   AST 13 11/23/2017 0905   AST 19 09/21/2017 0746   ALT 9  11/23/2017 0905   ALT 12 09/21/2017 0746   ALKPHOS 69 11/23/2017 0905   ALKPHOS 105 09/21/2017 0746   BILITOT 0.2 11/23/2017 0905   BILITOT 0.41 09/21/2017 0746   GFRNONAA >60 11/23/2017 0905   GFRAA >60 11/23/2017 0905    Lab Results  Component Value Date   CEA1 3.44 11/09/2017    Medications: I have reviewed the patient's current medications.   Assessment/Plan:  1.Metastatic colorectal cancer-status post an exploratory laparotomy 03/03/2016 revealing a proximal rectal mass, peritoneal carcinomatosis, and liver metastases  Biopsy of peritoneal nodules 03/03/2016 confirmed metastatic adenocarcinoma consistent with a colon primary  Foundation 1 testing-MSI-stable, tumor mutation burden-low, no BRAF NRAS or KRAS mutation  Cycle 1 FOLFIRI/PANITUMUMAB 04/14/2016  Cycle 2 FOLFIRI/panitumumab 04/28/2016  Cycle 3 FOLFIRI/panitumumab 05/12/2016  Cycle 4 FOLFIRI/panitumumab 05/27/2016  Cycle 5 FOLFIRI/panitumumab 06/09/2016  Restaging CT abdomen/pelvis 06/20/2016-no evidence of disease progression, decreased left hepatic lesion  Cycle 6 FOLFIRI/panitumumab 06/23/2016  Cycle 7 FOLFIRI/panitumumab 07/07/2016  Cycle 8 FOLFIRI/panitumumab 07/21/2016  Cycle 9 FOLFIRI/PANITUMUMAB 08/04/2016  Cycle 10 FOLFIRI/panitumumab 08/18/2016  Restaging CT 08/29/2016 with interval decrease in the size of the medial segment left liver lesion. Lesion identified previously in the rectosigmoid colon not evident on the current study.  5-FU/PANITUMUMAB 09/01/2016  Xeloda 7 days on/7 days off and PANITUMUMAB every 3 weeks 09/15/2016 (awaiting insurance approval  for Xeloda 09/15/2016)  CT abdomen/pelvis 12/25/2016-unchanged 5 mm right hepatic lesion, resolution of medial segment left liver lesion, no new liver lesion. Residual soft tissue fullness in the sigmoid , persistent distal esophageal wall thickening  Continuationof PANITUMUMAB every 3 weeks and Xeloda 7 days on/7 days off  CT  06/19/2017-new cystic/solid lesion in the left adnexa  Xeloda/panitumumab continued  CT 10/08/2016-progression of cystic pelvic mass  Cycle 1 FOLFIRI/Panitumumab 10/21/2017  Cystic pelvic mass aspirated 10/30/2017  Cycle 2 FOLFIRI/panitumumab 11/09/2017  Cycle 3 FOLFIRI/panitumumab 11/23/2017  2.History of aBowel obstruction secondary to #1  3. Chronic back pain-maintained on methadone  4. Essential thrombocytosis  5. Early obstruction of the left ureter noted at the time of surgery 03/03/2016-Dr. Tannenbaum placed a left double-J stent 03/18/2016  New onset right hydronephrosis 10/11/2017  Right ureter stent placement 10/13/2017  6. Abdominal wall cellulitis 03/10/2016, blood cultures positive for coagulase negative staphylococcus-methicillin-resistant, status post treatment with vancomycin and Bactrim   8. Port-A-Cath placement 04/10/2016, interventional radiology  9. Iron deficiency anemia. Feraheme 11/05/2015 , 11/12/2015,and 04/14/2016-persistent anemia and red cell microcytosis, oral iron resumed 11/17/2016-stable  10. Pain/tenderness left posterior iliac region-resolved  11. Hypokalemia-started on potassium replacement 06/09/2016  12. History of skin rashand paronychiasecondary to [facility], she continues minocycline, moisturizers, and fluticasone as needed  13.Acute onset right low back pain 10/11/2017-likely secondary to obstruction of the right kidney;left ureter stent exchange and right ureter stent placement 10/13/2017  14.Left leg pain and edema-bilateral lower extremity venous Doppler negative for DVT1/15/2019 and 10/19/2017. Left common femoral DVT noted on CT venogram 10/27/2017  15. Admission 10/22/2017 with bilateral pulmonary embolism, heparin drip initiated, converted to Lovenox 10/23/2017, converted to Xarelto during the 10/27/2017 hospital admission  16. Admission 10/27/2017 with increased back/leg pain and a  fever-blood and urine cultures positive fora resistant E. coli    Disposition: [name] has completed 2 cycles of salvage therapy with FOLFIRI/panitumumab.  Her pain is better controlled.  She continues to have left greater than right leg edema.  The edema is secondary to the pelvic tumor burden, a left leg DVT, and hypoalbuminemia.  She will begin a trial of low-dose Lasix.  She will contact us later this week if the Lasix does not help.  The plan is to complete 4-5 cycles of FOLFIRI/panitumumab prior to a restaging CT evaluation.  We will consider referring her for repeat aspiration of the cystic pelvic mass if the leg edema does not improve.  She had significant improvement in leg edema when the mass was aspirated 10/30/2017.  She will elevate the legs.  25 minutes were spent with the patient today.  The majority of the time was used for counseling and coordination of care.  [name] Coder, MD  11/23/2017  10:44 AM

## 2017-11-23 NOTE — Progress Notes (Signed)
[place] Spiritual Care Note  Followed up with Carolyn Barton in infusion today. She is so relieved to have her pain managed well now, and is planning a joy-filled week, starting with celebrating her 62st birthday tomorrow. Her daughters are coming to spend the night, and more family will visit over the weekend; she is planning to pace herself with rest in between. Per pt, next week or after she plans to contact me to make a 1:1 spiritual care appt.   [place], [place], [facility] Pager [phone] Voicemail [phone]

## 2017-11-23 NOTE — Patient Instructions (Signed)
[facility] Discharge Instructions for Patients Receiving Chemotherapy  Today you received the following chemotherapy agents leucovorin/irinotecan/florouracil   To help prevent nausea and vomiting after your treatment, we encourage you to take your nausea medication as directed  If you develop nausea and vomiting that is not controlled by your nausea medication, call the clinic.   BELOW ARE SYMPTOMS THAT SHOULD BE REPORTED IMMEDIATELY:  *FEVER GREATER THAN 100.5 F  *CHILLS WITH OR WITHOUT FEVER  NAUSEA AND VOMITING THAT IS NOT CONTROLLED WITH YOUR NAUSEA MEDICATION  *UNUSUAL SHORTNESS OF BREATH  *UNUSUAL BRUISING OR BLEEDING  TENDERNESS IN MOUTH AND THROAT WITH OR WITHOUT PRESENCE OF ULCERS  *URINARY PROBLEMS  *BOWEL PROBLEMS  UNUSUAL RASH Items with * indicate a potential emergency and should be followed up as soon as possible.  Feel free to call the clinic you have any questions or concerns. The clinic phone number is [phone].  

## 2017-11-24 ENCOUNTER — Other Ambulatory Visit: Payer: Self-pay

## 2017-11-24 ENCOUNTER — Other Ambulatory Visit: Payer: Self-pay | Admitting: *Deleted

## 2017-11-24 DIAGNOSIS — I2699 Other pulmonary embolism without acute cor pulmonale: Secondary | ICD-10-CM

## 2017-11-24 MED ORDER — RIVAROXABAN (XARELTO) VTE STARTER PACK (15 & 20 MG): ORAL_TABLET | ORAL | 0 refills | Status: DC

## 2017-11-24 MED ORDER — RIVAROXABAN 20 MG PO TABS: 20.0000 mg | ORAL_TABLET | Freq: Every day | ORAL | 1 refills | Status: DC

## 2017-11-24 MED ORDER — FUROSEMIDE 20 MG PO TABS: 20.0000 mg | ORAL_TABLET | Freq: Every day | ORAL | 0 refills | Status: DC

## 2017-11-25 ENCOUNTER — Inpatient Hospital Stay: Payer: BLUE CROSS/BLUE SHIELD

## 2017-11-25 VITALS — BP 120/70 | HR 96 | Temp 98.0°F | Resp 18

## 2017-11-25 DIAGNOSIS — C20 Malignant neoplasm of rectum: Secondary | ICD-10-CM

## 2017-11-25 MED ORDER — HEPARIN SOD (PORK) LOCK FLUSH 100 UNIT/ML IV SOLN
500.0000 [IU] | Freq: Once | INTRAVENOUS | Status: AC | PRN
  Administered 2017-11-25: 500 [IU]
  Filled 2017-11-25: qty 5

## 2017-11-25 MED ORDER — SODIUM CHLORIDE 0.9% FLUSH
10.0000 mL | INTRAVENOUS | Status: DC | PRN
  Administered 2017-11-25: 10 mL
  Filled 2017-11-25: qty 10

## 2017-12-03 ENCOUNTER — Other Ambulatory Visit: Payer: Self-pay | Admitting: Oncology

## 2017-12-03 ENCOUNTER — Encounter: Payer: Self-pay | Admitting: *Deleted

## 2017-12-07 ENCOUNTER — Other Ambulatory Visit: Payer: BLUE CROSS/BLUE SHIELD

## 2017-12-07 ENCOUNTER — Inpatient Hospital Stay (HOSPITAL_BASED_OUTPATIENT_CLINIC_OR_DEPARTMENT_OTHER): Payer: BLUE CROSS/BLUE SHIELD | Admitting: Nurse Practitioner

## 2017-12-07 ENCOUNTER — Telehealth: Payer: Self-pay

## 2017-12-07 ENCOUNTER — Inpatient Hospital Stay: Payer: BLUE CROSS/BLUE SHIELD

## 2017-12-07 ENCOUNTER — Inpatient Hospital Stay: Payer: BLUE CROSS/BLUE SHIELD | Attending: Oncology

## 2017-12-07 ENCOUNTER — Ambulatory Visit: Payer: BLUE CROSS/BLUE SHIELD

## 2017-12-07 ENCOUNTER — Encounter: Payer: Self-pay | Admitting: Nurse Practitioner

## 2017-12-07 VITALS — BP 116/82 | HR 92 | Temp 98.3°F | Resp 18 | Ht 65.0 in | Wt 135.2 lb

## 2017-12-07 DIAGNOSIS — R600 Localized edema: Secondary | ICD-10-CM

## 2017-12-07 DIAGNOSIS — Z452 Encounter for adjustment and management of vascular access device: Secondary | ICD-10-CM | POA: Insufficient documentation

## 2017-12-07 DIAGNOSIS — C19 Malignant neoplasm of rectosigmoid junction: Secondary | ICD-10-CM

## 2017-12-07 DIAGNOSIS — E8809 Other disorders of plasma-protein metabolism, not elsewhere classified: Secondary | ICD-10-CM

## 2017-12-07 DIAGNOSIS — C20 Malignant neoplasm of rectum: Secondary | ICD-10-CM

## 2017-12-07 DIAGNOSIS — M549 Dorsalgia, unspecified: Secondary | ICD-10-CM | POA: Insufficient documentation

## 2017-12-07 DIAGNOSIS — Z5112 Encounter for antineoplastic immunotherapy: Secondary | ICD-10-CM | POA: Diagnosis present

## 2017-12-07 DIAGNOSIS — C786 Secondary malignant neoplasm of retroperitoneum and peritoneum: Secondary | ICD-10-CM

## 2017-12-07 DIAGNOSIS — C787 Secondary malignant neoplasm of liver and intrahepatic bile duct: Secondary | ICD-10-CM | POA: Insufficient documentation

## 2017-12-07 DIAGNOSIS — I82412 Acute embolism and thrombosis of left femoral vein: Secondary | ICD-10-CM

## 2017-12-07 DIAGNOSIS — Z95828 Presence of other vascular implants and grafts: Secondary | ICD-10-CM

## 2017-12-07 DIAGNOSIS — D509 Iron deficiency anemia, unspecified: Secondary | ICD-10-CM

## 2017-12-07 DIAGNOSIS — Z933 Colostomy status: Secondary | ICD-10-CM | POA: Insufficient documentation

## 2017-12-07 DIAGNOSIS — Z5111 Encounter for antineoplastic chemotherapy: Secondary | ICD-10-CM | POA: Diagnosis present

## 2017-12-07 DIAGNOSIS — I2699 Other pulmonary embolism without acute cor pulmonale: Secondary | ICD-10-CM

## 2017-12-07 LAB — CBC WITH DIFFERENTIAL/PLATELET
BASOS ABS: 0.1 10*3/uL (ref 0.0–0.1)
BASOS PCT: 1 %
EOS ABS: 0.4 10*3/uL (ref 0.0–0.5)
EOS PCT: 6 %
HCT: 32.3 % — ABNORMAL LOW (ref 34.8–46.6)
Hemoglobin: 10.5 g/dL — ABNORMAL LOW (ref 11.6–15.9)
Lymphocytes Relative: 14 %
Lymphs Abs: 0.9 10*3/uL (ref 0.9–3.3)
MCH: 27.3 pg (ref 25.1–34.0)
MCHC: 32.4 g/dL (ref 31.5–36.0)
MCV: 84.4 fL (ref 79.5–101.0)
MONO ABS: 0.6 10*3/uL (ref 0.1–0.9)
Monocytes Relative: 10 %
Neutro Abs: 4.3 10*3/uL (ref 1.5–6.5)
Neutrophils Relative %: 69 %
PLATELETS: 563 10*3/uL — AB (ref 145–400)
RBC: 3.83 MIL/uL (ref 3.70–5.45)
RDW: 17.7 % — AB (ref 11.2–14.5)
WBC: 6.2 10*3/uL (ref 3.9–10.3)

## 2017-12-07 LAB — MAGNESIUM: Magnesium: 1.8 mg/dL (ref 1.7–2.4)

## 2017-12-07 LAB — COMPREHENSIVE METABOLIC PANEL
ALBUMIN: 2.1 g/dL — AB (ref 3.5–5.0)
ALK PHOS: 62 U/L (ref 40–150)
ALT: 9 U/L (ref 0–55)
AST: 11 U/L (ref 5–34)
Anion gap: 6 (ref 3–11)
BILIRUBIN TOTAL: 0.3 mg/dL (ref 0.2–1.2)
BUN: 13 mg/dL (ref 7–26)
CALCIUM: 8.2 mg/dL — AB (ref 8.4–10.4)
CO2: 27 mmol/L (ref 22–29)
Chloride: 100 mmol/L (ref 98–109)
Creatinine, Ser: 0.52 mg/dL — ABNORMAL LOW (ref 0.60–1.10)
GFR calc Af Amer: >60 mL/min (ref >60)
GFR calc non Af Amer: >60 mL/min (ref >60)
GLUCOSE: 102 mg/dL (ref 70–140)
Potassium: 3.5 mmol/L (ref 3.5–5.1)
Sodium: 133 mmol/L — ABNORMAL LOW (ref 136–145)
TOTAL PROTEIN: 4.7 g/dL — AB (ref 6.4–8.3)

## 2017-12-07 LAB — CEA (IN HOUSE-CHCC): CEA (CHCC-IN HOUSE): 2.01 ng/mL (ref 0.00–5.00)

## 2017-12-07 MED ORDER — LEUCOVORIN CALCIUM INJECTION 350 MG
400.0000 mg/m2 | Freq: Once | INTRAVENOUS | Status: AC
  Administered 2017-12-07: 652 mg via INTRAVENOUS
  Filled 2017-12-07: qty 32.6

## 2017-12-07 MED ORDER — DEXAMETHASONE SODIUM PHOSPHATE 10 MG/ML IJ SOLN
INTRAMUSCULAR | Status: AC
  Filled 2017-12-07: qty 1

## 2017-12-07 MED ORDER — PALONOSETRON HCL INJECTION 0.25 MG/5ML
INTRAVENOUS | Status: AC
  Filled 2017-12-07: qty 5

## 2017-12-07 MED ORDER — DEXAMETHASONE SODIUM PHOSPHATE 10 MG/ML IJ SOLN
10.0000 mg | Freq: Once | INTRAMUSCULAR | Status: AC
  Administered 2017-12-07: 10 mg via INTRAVENOUS

## 2017-12-07 MED ORDER — SODIUM CHLORIDE 0.9 % IJ SOLN
10.0000 mL | INTRAMUSCULAR | Status: DC | PRN
  Administered 2017-12-07: 10 mL via INTRAVENOUS
  Filled 2017-12-07: qty 10

## 2017-12-07 MED ORDER — ATROPINE SULFATE 1 MG/ML IJ SOLN
0.5000 mg | Freq: Once | INTRAMUSCULAR | Status: AC | PRN
  Administered 2017-12-07: 0.5 mg via INTRAVENOUS

## 2017-12-07 MED ORDER — SODIUM CHLORIDE 0.9 % IV SOLN
5.5000 mg/kg | Freq: Once | INTRAVENOUS | Status: AC
  Administered 2017-12-07: 300 mg via INTRAVENOUS
  Filled 2017-12-07: qty 15

## 2017-12-07 MED ORDER — SODIUM CHLORIDE 0.9 % IV SOLN
Freq: Once | INTRAVENOUS | Status: AC
  Administered 2017-12-07: 11:00:00 via INTRAVENOUS

## 2017-12-07 MED ORDER — FLUOROURACIL CHEMO INJECTION 2.5 GM/50ML
400.0000 mg/m2 | Freq: Once | INTRAVENOUS | Status: AC
  Administered 2017-12-07: 650 mg via INTRAVENOUS
  Filled 2017-12-07: qty 13

## 2017-12-07 MED ORDER — SODIUM CHLORIDE 0.9 % IV SOLN
2400.0000 mg/m2 | INTRAVENOUS | Status: DC
  Administered 2017-12-07: 3900 mg via INTRAVENOUS
  Filled 2017-12-07: qty 78

## 2017-12-07 MED ORDER — ATROPINE SULFATE 1 MG/ML IJ SOLN
INTRAMUSCULAR | Status: AC
  Filled 2017-12-07: qty 1

## 2017-12-07 MED ORDER — IRINOTECAN HCL CHEMO INJECTION 100 MG/5ML
180.0000 mg/m2 | Freq: Once | INTRAVENOUS | Status: AC
  Administered 2017-12-07: 300 mg via INTRAVENOUS
  Filled 2017-12-07: qty 15

## 2017-12-07 MED ORDER — PALONOSETRON HCL INJECTION 0.25 MG/5ML
0.2500 mg | Freq: Once | INTRAVENOUS | Status: AC
  Administered 2017-12-07: 0.25 mg via INTRAVENOUS

## 2017-12-07 NOTE — Progress Notes (Signed)
[facility] OFFICE PROGRESS NOTE   Diagnosis: Colon cancer  INTERVAL HISTORY:   Carolyn Barton returns as scheduled.  She completed cycle 3 FOLFIRI/Panitumumab 11/23/2017.  She denies significant nausea/vomiting.  No mouth sores.  No diarrhea.  No significant rash.  She notes dry skin.  She also notes some cracks at the fingertips.  She continues to have leg swelling.  Pain is well controlled with methadone, typically twice a day.  She rarely takes immediate release morphine.  Objective:  Vital signs in last 24 hours:  Blood pressure 116/82, pulse 92, temperature 98.3 F (36.8 C), temperature source Oral, resp. rate 18, height '5\' 5"'$  (1.651 m), weight 135 lb 3.2 oz (61.3 kg), SpO2 100 %.    HEENT: No thrush or ulcers. Resp: Lungs clear bilaterally. Cardio: Regular rate and rhythm. GI: Abdomen soft and nontender.  No hepatomegaly.  Left lower quadrant colostomy. Vascular: Edema throughout the left leg; edema right leg below the knee.  Chronic stasis change at the lower legs bilaterally. Neuro: Alert and oriented. Skin: Skin in general has a dry appearance.  Palms without erythema.  A few linear breaks in the skin at the fingertips. Port-A-Cath without erythema.   Lab Results:  Lab Results  Component Value Date   WBC 6.2 12/07/2017   HGB 10.5 (L) 12/07/2017   HCT 32.3 (L) 12/07/2017   MCV 84.4 12/07/2017   PLT 563 (H) 12/07/2017   NEUTROABS 4.3 12/07/2017    Imaging:  No results found.  Medications: I have reviewed the patient's current medications.  Assessment/Plan:  1.Metastatic colorectal cancer-status post an exploratory laparotomy 03/03/2016 revealing a proximal rectal mass, peritoneal carcinomatosis, and liver metastases  Biopsy of peritoneal nodules 03/03/2016 confirmed metastatic adenocarcinoma consistent with a colon primary  Foundation 1 testing-MSI-stable, tumor mutation burden-low, no BRAF NRAS or KRAS mutation  Cycle 1 FOLFIRI/PANITUMUMAB  04/14/2016  Cycle 2 FOLFIRI/panitumumab 04/28/2016  Cycle 3 FOLFIRI/panitumumab 05/12/2016  Cycle 4 FOLFIRI/panitumumab 05/27/2016  Cycle 5 FOLFIRI/panitumumab 06/09/2016  Restaging CT abdomen/pelvis 06/20/2016-no evidence of disease progression, decreased left hepatic lesion  Cycle 6 FOLFIRI/panitumumab 06/23/2016  Cycle 7 FOLFIRI/panitumumab 07/07/2016  Cycle 8 FOLFIRI/panitumumab 07/21/2016  Cycle 9 FOLFIRI/PANITUMUMAB 08/04/2016  Cycle 10 FOLFIRI/panitumumab 08/18/2016  Restaging CT 08/29/2016 with interval decrease in the size of the medial segment left liver lesion. Lesion identified previously in the rectosigmoid colon not evident on the current study.  5-FU/PANITUMUMAB 09/01/2016  Xeloda 7 days on/7 days off and PANITUMUMAB every 3 weeks 09/15/2016 (awaiting insurance approval for Xeloda 09/15/2016)  CT abdomen/pelvis 12/25/2016-unchanged 5 mm right hepatic lesion, resolution of medial segment left liver lesion, no new liver lesion. Residual soft tissue fullness in the sigmoid , persistent distal esophageal wall thickening  Continuationof PANITUMUMAB every 3 weeks and Xeloda 7 days on/7 days off  CT 06/19/2017-new cystic/solid lesion in the left adnexa  Xeloda/panitumumab continued  CT 10/08/2016-progression of cystic pelvic mass  Cycle 1 FOLFIRI/Panitumumab 10/21/2017  Cystic pelvic mass aspirated 10/30/2017  Cycle 2 FOLFIRI/panitumumab 11/09/2017  Cycle 3 FOLFIRI/panitumumab 11/23/2017  Cycle 4 FOLFIRI/Panitumumab 12/07/2017  2.History of aBowel obstruction secondary to #1  3. Chronic back pain-maintained on methadone  4. Essential thrombocytosis  5. Early obstruction of the left ureter noted at the time of surgery 03/03/2016-Dr. Tannenbaum placed a left double-J stent 03/18/2016  New onset right hydronephrosis 10/11/2017  Right ureter stent placement 10/13/2017  6. Abdominal wall cellulitis 03/10/2016, blood cultures positive for coagulase  negative staphylococcus-methicillin-resistant, status post treatment with vancomycin and Bactrim   8. Port-A-Cath  placement 04/10/2016, interventional radiology  9. Iron deficiency anemia. Feraheme 11/05/2015 , 11/12/2015,and 04/14/2016-persistent anemia and red cell microcytosis, oral iron resumed 11/17/2016-stable  10. Pain/tenderness left posterior iliac region-resolved  11. Hypokalemia-started on potassium replacement 06/09/2016  12. History of skin rashand paronychiasecondary to [facility], she continues minocycline, moisturizers, and fluticasone as needed  13.Acute onset right low back pain 10/11/2017-likely secondary to obstruction of the right kidney;left ureter stent exchange and right ureter stent placement 10/13/2017  14.Left leg pain and edema-bilateral lower extremity venous Doppler negative for DVT1/15/2019 and 10/19/2017. Left common femoral DVT noted on CT venogram 10/27/2017  15. Admission 10/22/2017 with bilateral pulmonary embolism, heparin drip initiated, converted to Lovenox 10/23/2017, converted to Xarelto during the 10/27/2017 hospital admission  16. Admission 10/27/2017 with increased back/leg pain and a fever-blood and urine cultures positive fora resistant E. coli    [name] appears unchanged.  She has completed 3 cycles of FOLFIRI/Panitumumab.  Plan to proceed with cycle 4 today as scheduled.  We will refer her for restaging CT scans after she has completed 5 cycles.  She continues to have lower extremity edema secondary to pelvic tumor, left leg DVT and hypoalbuminemia.  Her pain is well controlled.  She will continue methadone with immediate release morphine as needed.  She will return for lab, follow-up and cycle 5 FOLFIRI/Panitumumab in 2 weeks.  She will contact the office in the interim with any problems.    Ned Card ANP/GNP-BC   12/07/2017  10:52 AM

## 2017-12-07 NOTE — Telephone Encounter (Signed)
Printed avs and calender of upcoming appointment. Per 3/11 los 

## 2017-12-07 NOTE — Patient Instructions (Signed)
[facility] Discharge Instructions for Patients Receiving Chemotherapy  Today you received the following chemotherapy agents Irinotecan, Leucovorin, 5FU, and Vectibix.   To help prevent nausea and vomiting after your treatment, we encourage you to take your nausea medication as directed.   If you develop nausea and vomiting that is not controlled by your nausea medication, call the clinic.   BELOW ARE SYMPTOMS THAT SHOULD BE REPORTED IMMEDIATELY:  *FEVER GREATER THAN 100.5 F  *CHILLS WITH OR WITHOUT FEVER  NAUSEA AND VOMITING THAT IS NOT CONTROLLED WITH YOUR NAUSEA MEDICATION  *UNUSUAL SHORTNESS OF BREATH  *UNUSUAL BRUISING OR BLEEDING  TENDERNESS IN MOUTH AND THROAT WITH OR WITHOUT PRESENCE OF ULCERS  *URINARY PROBLEMS  *BOWEL PROBLEMS  UNUSUAL RASH Items with * indicate a potential emergency and should be followed up as soon as possible.  Feel free to call the clinic should you have any questions or concerns. The clinic phone number is [phone].  Please show the [facility] at check-in to the Emergency Department and triage nurse.

## 2017-12-09 ENCOUNTER — Inpatient Hospital Stay: Payer: BLUE CROSS/BLUE SHIELD

## 2017-12-09 VITALS — BP 111/74 | HR 89 | Temp 98.1°F | Resp 16

## 2017-12-09 DIAGNOSIS — C20 Malignant neoplasm of rectum: Secondary | ICD-10-CM

## 2017-12-09 MED ORDER — SODIUM CHLORIDE 0.9% FLUSH
10.0000 mL | INTRAVENOUS | Status: DC | PRN
  Administered 2017-12-09: 10 mL
  Filled 2017-12-09: qty 10

## 2017-12-09 MED ORDER — HEPARIN SOD (PORK) LOCK FLUSH 100 UNIT/ML IV SOLN
500.0000 [IU] | Freq: Once | INTRAVENOUS | Status: AC | PRN
  Administered 2017-12-09: 500 [IU]
  Filled 2017-12-09: qty 5

## 2017-12-16 ENCOUNTER — Encounter: Payer: Self-pay | Admitting: Nurse Practitioner

## 2017-12-16 ENCOUNTER — Other Ambulatory Visit: Payer: Self-pay | Admitting: Nurse Practitioner

## 2017-12-16 ENCOUNTER — Telehealth: Payer: Self-pay

## 2017-12-16 ENCOUNTER — Inpatient Hospital Stay (HOSPITAL_BASED_OUTPATIENT_CLINIC_OR_DEPARTMENT_OTHER): Payer: BLUE CROSS/BLUE SHIELD | Admitting: Nurse Practitioner

## 2017-12-16 VITALS — BP 123/70 | HR 95 | Temp 98.5°F | Resp 20 | Ht 65.0 in | Wt 143.2 lb

## 2017-12-16 DIAGNOSIS — R600 Localized edema: Secondary | ICD-10-CM | POA: Diagnosis not present

## 2017-12-16 DIAGNOSIS — I82412 Acute embolism and thrombosis of left femoral vein: Secondary | ICD-10-CM | POA: Diagnosis not present

## 2017-12-16 DIAGNOSIS — M549 Dorsalgia, unspecified: Secondary | ICD-10-CM | POA: Diagnosis not present

## 2017-12-16 DIAGNOSIS — C786 Secondary malignant neoplasm of retroperitoneum and peritoneum: Secondary | ICD-10-CM | POA: Diagnosis not present

## 2017-12-16 DIAGNOSIS — C787 Secondary malignant neoplasm of liver and intrahepatic bile duct: Secondary | ICD-10-CM

## 2017-12-16 DIAGNOSIS — E8809 Other disorders of plasma-protein metabolism, not elsewhere classified: Secondary | ICD-10-CM

## 2017-12-16 DIAGNOSIS — Z933 Colostomy status: Secondary | ICD-10-CM

## 2017-12-16 DIAGNOSIS — C20 Malignant neoplasm of rectum: Secondary | ICD-10-CM | POA: Diagnosis not present

## 2017-12-16 DIAGNOSIS — C19 Malignant neoplasm of rectosigmoid junction: Secondary | ICD-10-CM

## 2017-12-16 MED ORDER — SULFAMETHOXAZOLE-TRIMETHOPRIM 800-160 MG PO TABS: 1.0000 | ORAL_TABLET | Freq: Two times a day (BID) | ORAL | 0 refills | Status: DC

## 2017-12-16 NOTE — Progress Notes (Signed)
[facility] OFFICE PROGRESS NOTE   Diagnosis:  Colon cancer  INTERVAL HISTORY:   Ms. Keegan returns prior to scheduled follow-up for evaluation of left leg edema.  She completed cycle 4 FOLFIRI/Panitumumab 12/07/2017.  She reports increased bilateral leg edema left greater than right beginning 2-3 days ago.  She feels the swelling is similar to that she experienced prior to having the pelvic mass aspirated on 10/30/2017.  She notes redness at both lower legs.  No fever or shaking chills.  Her pain is well controlled with methadone.  She rarely takes MSIR.  Objective:  Vital signs in last 24 hours:  Blood pressure 123/70, pulse 95, temperature 98.5 F (36.9 C), temperature source Oral, resp. rate 20, height _0  (1.651 m), weight 143 lb 3.2 oz (65 kg), SpO2 100 %.    HEENT: No thrush or ulcers. Resp: Lungs clear bilaterally. Cardio: Regular rate and rhythm. GI: Abdomen soft and nontender.  No hepatomegaly.  Left lower quadrant colostomy. Vascular: Edema throughout the left leg; edema right leg below the knee.  Chronic stasis change at the lower legs bilaterally.  Erythema at the lower legs bilaterally.  Left pretibial region is the most erythematous area.  Some areas of weeping left lower leg. Neuro: Alert and oriented. Port-A-Cath without erythema.   Lab Results:  Lab Results  Component Value Date   WBC 6.2 12/07/2017   HGB 10.5 (L) 12/07/2017   HCT 32.3 (L) 12/07/2017   MCV 84.4 12/07/2017   PLT 563 (H) 12/07/2017   NEUTROABS 4.3 12/07/2017    Imaging:  No results found.  Medications: I have reviewed the patient's current medications.  Assessment/Plan: 1.Metastatic colorectal cancer-status post an exploratory laparotomy 03/03/2016 revealing a proximal rectal mass, peritoneal carcinomatosis, and liver metastases  Biopsy of peritoneal nodules 03/03/2016 confirmed metastatic adenocarcinoma consistent with a colon primary  Foundation 1 testing-MSI-stable,  tumor mutation burden-low, no BRAF NRAS or KRAS mutation  Cycle 1 FOLFIRI/PANITUMUMAB 04/14/2016  Cycle 2 FOLFIRI/panitumumab 04/28/2016  Cycle 3 FOLFIRI/panitumumab 05/12/2016  Cycle 4 FOLFIRI/panitumumab 05/27/2016  Cycle 5 FOLFIRI/panitumumab 06/09/2016  Restaging CT abdomen/pelvis 06/20/2016-no evidence of disease progression, decreased left hepatic lesion  Cycle 6 FOLFIRI/panitumumab 06/23/2016  Cycle 7 FOLFIRI/panitumumab 07/07/2016  Cycle 8 FOLFIRI/panitumumab 07/21/2016  Cycle 9 FOLFIRI/PANITUMUMAB 08/04/2016  Cycle 10 FOLFIRI/panitumumab 08/18/2016  Restaging CT 08/29/2016 with interval decrease in the size of the medial segment left liver lesion. Lesion identified previously in the rectosigmoid colon not evident on the current study.  5-FU/PANITUMUMAB 09/01/2016  Xeloda 7 days on/7 days off and PANITUMUMAB every 3 weeks 09/15/2016 (awaiting insurance approval for Xeloda 09/15/2016)  CT abdomen/pelvis 12/25/2016-unchanged 5 mm right hepatic lesion, resolution of medial segment left liver lesion, no new liver lesion. Residual soft tissue fullness in the sigmoid , persistent distal esophageal wall thickening  Continuationof PANITUMUMAB every 3 weeks and Xeloda 7 days on/7 days off  CT 06/19/2017-new cystic/solid lesion in the left adnexa  Xeloda/panitumumab continued  CT 10/08/2016-progression of cystic pelvic mass  Cycle 1 FOLFIRI/Panitumumab 10/21/2017  Cystic pelvic mass aspirated 10/30/2017  Cycle 2 FOLFIRI/panitumumab 11/09/2017  Cycle 3 FOLFIRI/panitumumab 11/23/2017  Cycle 4 FOLFIRI/Panitumumab 12/07/2017  2.History of aBowel obstruction secondary to #1  3. Chronic back pain-maintained on methadone  4. Essential thrombocytosis  5. Early obstruction of the left ureter noted at the time of surgery 03/03/2016-Dr. Tannenbaum placed a left double-J stent 03/18/2016  New onset right hydronephrosis 10/11/2017  Right ureter stent placement  10/13/2017  6. Abdominal wall cellulitis 03/10/2016, blood cultures  positive for coagulase negative staphylococcus-methicillin-resistant, status post treatment with vancomycin and Bactrim   8. Port-A-Cath placement 04/10/2016, interventional radiology  9. Iron deficiency anemia. Feraheme 11/05/2015 , 11/12/2015,and 04/14/2016-persistent anemia and red cell microcytosis, oral iron resumed 11/17/2016-stable  10. Pain/tenderness left posterior iliac region-resolved  11. Hypokalemia-started on potassium replacement 06/09/2016  12. History of skin rashand paronychiasecondary to [facility], she continues minocycline, moisturizers, and fluticasone as needed  13.Acute onset right low back pain 10/11/2017-likely secondary to obstruction of the right kidney;left ureter stent exchange and right ureter stent placement 10/13/2017  14.Left leg pain and edema-bilateral lower extremity venous Doppler negative for DVT1/15/2019 and 10/19/2017. Left common femoral DVT noted on CT venogram 10/27/2017; CT aspiration of pelvic cystic mass 10/30/2017 with subsequent marked improvement of leg edema.  15. Admission 10/22/2017 with bilateral pulmonary embolism, heparin drip initiated, converted to Lovenox 10/23/2017, converted to Xarelto during the 10/27/2017 hospital admission  16. Admission 10/27/2017 with increased back/leg pain and a fever-blood and urine cultures positive fora resistant E. coli     Disposition: Ms. Coor is a 62 year old woman with metastatic colorectal cancer currently on active treatment with FOLFIRI/Panitumumab, cycle 4 completed 12/07/2017.  She presents today with recurrent bilateral lower extremity edema.  The edema is likely secondary to a combination of pelvic tumor, left leg DVT and hypoalbuminemia.  The edema improved significantly following aspiration of the pelvic mass in early February.  We are referring her for a repeat CT aspiration of the pelvic mass.   She will also begin a 7-day course of Septra DS due to concern for cellulitis left lower leg.  She will return for follow-up as scheduled 12/21/2017.  She understands to contact the office prior to that visit with worsening of current symptoms, specifically increased erythema, fever.  Plan reviewed with Dr. Benay Spice.    Ned Card ANP/GNP-BC   12/16/2017  3:47 PM

## 2017-12-16 NOTE — Telephone Encounter (Signed)
Returned call to patient. Pt reports that left leg is "very swollen, towards ankle its very red and then ive got bumps that look like mosquito bites on lower leg. Doesn't itch or hurt, just hard to walk". Pt denies pain and heat to extremity. Noted that leg has been more swollen in last couple of days. Will consult Ned Card, NP. Per Ned Card pt can come in for appt this afternoon, pt agreed.

## 2017-12-19 ENCOUNTER — Other Ambulatory Visit: Payer: Self-pay | Admitting: Oncology

## 2017-12-21 ENCOUNTER — Other Ambulatory Visit: Payer: BLUE CROSS/BLUE SHIELD

## 2017-12-21 ENCOUNTER — Inpatient Hospital Stay: Payer: BLUE CROSS/BLUE SHIELD

## 2017-12-21 ENCOUNTER — Other Ambulatory Visit: Payer: Self-pay | Admitting: Radiology

## 2017-12-21 ENCOUNTER — Inpatient Hospital Stay (HOSPITAL_BASED_OUTPATIENT_CLINIC_OR_DEPARTMENT_OTHER): Payer: BLUE CROSS/BLUE SHIELD | Admitting: Oncology

## 2017-12-21 VITALS — BP 125/69 | HR 106 | Temp 98.6°F | Resp 19 | Ht 65.0 in | Wt 149.1 lb

## 2017-12-21 DIAGNOSIS — C787 Secondary malignant neoplasm of liver and intrahepatic bile duct: Secondary | ICD-10-CM | POA: Diagnosis not present

## 2017-12-21 DIAGNOSIS — D509 Iron deficiency anemia, unspecified: Secondary | ICD-10-CM

## 2017-12-21 DIAGNOSIS — Z7901 Long term (current) use of anticoagulants: Secondary | ICD-10-CM

## 2017-12-21 DIAGNOSIS — C20 Malignant neoplasm of rectum: Secondary | ICD-10-CM

## 2017-12-21 DIAGNOSIS — L03116 Cellulitis of left lower limb: Secondary | ICD-10-CM

## 2017-12-21 DIAGNOSIS — M549 Dorsalgia, unspecified: Secondary | ICD-10-CM

## 2017-12-21 DIAGNOSIS — I2699 Other pulmonary embolism without acute cor pulmonale: Secondary | ICD-10-CM | POA: Diagnosis not present

## 2017-12-21 DIAGNOSIS — I82412 Acute embolism and thrombosis of left femoral vein: Secondary | ICD-10-CM

## 2017-12-21 DIAGNOSIS — C786 Secondary malignant neoplasm of retroperitoneum and peritoneum: Secondary | ICD-10-CM | POA: Diagnosis not present

## 2017-12-21 DIAGNOSIS — C19 Malignant neoplasm of rectosigmoid junction: Secondary | ICD-10-CM

## 2017-12-21 DIAGNOSIS — N9489 Other specified conditions associated with female genital organs and menstrual cycle: Secondary | ICD-10-CM

## 2017-12-21 DIAGNOSIS — R600 Localized edema: Secondary | ICD-10-CM

## 2017-12-21 DIAGNOSIS — Z95828 Presence of other vascular implants and grafts: Secondary | ICD-10-CM

## 2017-12-21 LAB — CBC WITH DIFFERENTIAL (CANCER CENTER ONLY)
BASOS ABS: 0 10*3/uL (ref 0.0–0.1)
Basophils Relative: 1 %
EOS ABS: 0.4 10*3/uL (ref 0.0–0.5)
EOS PCT: 5 %
HCT: 28.2 % — ABNORMAL LOW (ref 34.8–46.6)
Hemoglobin: 9 g/dL — ABNORMAL LOW (ref 11.6–15.9)
LYMPHS ABS: 0.8 10*3/uL — AB (ref 0.9–3.3)
LYMPHS PCT: 11 %
MCH: 26.2 pg (ref 25.1–34.0)
MCHC: 31.9 g/dL (ref 31.5–36.0)
MCV: 82.2 fL (ref 79.5–101.0)
MONO ABS: 1.1 10*3/uL — AB (ref 0.1–0.9)
Monocytes Relative: 15 %
Neutro Abs: 5.3 10*3/uL (ref 1.5–6.5)
Neutrophils Relative %: 68 %
PLATELETS: 576 10*3/uL — AB (ref 145–400)
RBC: 3.43 MIL/uL — AB (ref 3.70–5.45)
RDW: 17.2 % — AB (ref 11.2–14.5)
WBC Count: 7.5 10*3/uL (ref 3.9–10.3)

## 2017-12-21 LAB — CMP (CANCER CENTER ONLY)
ALT: 9 U/L (ref 0–55)
ANION GAP: 8 (ref 3–11)
AST: 13 U/L (ref 5–34)
Albumin: 1.9 g/dL — ABNORMAL LOW (ref 3.5–5.0)
Alkaline Phosphatase: 61 U/L (ref 40–150)
BUN: 16 mg/dL (ref 7–26)
CHLORIDE: 98 mmol/L (ref 98–109)
CO2: 24 mmol/L (ref 22–29)
Calcium: 8.1 mg/dL — ABNORMAL LOW (ref 8.4–10.4)
Creatinine: 0.63 mg/dL (ref 0.60–1.10)
GFR, Est AFR Am: >60 mL/min (ref >60)
GFR, Estimated: >60 mL/min (ref >60)
Glucose, Bld: 116 mg/dL (ref 70–140)
POTASSIUM: 3.3 mmol/L — AB (ref 3.5–5.1)
Sodium: 130 mmol/L — ABNORMAL LOW (ref 136–145)
TOTAL PROTEIN: 4.6 g/dL — AB (ref 6.4–8.3)
Total Bilirubin: 0.3 mg/dL (ref 0.2–1.2)

## 2017-12-21 LAB — MAGNESIUM: MAGNESIUM: 1.6 mg/dL (ref 1.5–2.5)

## 2017-12-21 MED ORDER — DEXTROSE 5 % IV SOLN
400.0000 mg/m2 | Freq: Once | INTRAVENOUS | Status: AC
  Administered 2017-12-21: 652 mg via INTRAVENOUS
  Filled 2017-12-21: qty 32.6

## 2017-12-21 MED ORDER — SODIUM CHLORIDE 0.9 % IV SOLN
2400.0000 mg/m2 | INTRAVENOUS | Status: DC
  Administered 2017-12-21: 3900 mg via INTRAVENOUS
  Filled 2017-12-21: qty 78

## 2017-12-21 MED ORDER — ATROPINE SULFATE 1 MG/ML IJ SOLN
INTRAMUSCULAR | Status: AC
  Filled 2017-12-21: qty 1

## 2017-12-21 MED ORDER — FLUOROURACIL CHEMO INJECTION 2.5 GM/50ML
400.0000 mg/m2 | Freq: Once | INTRAVENOUS | Status: AC
  Administered 2017-12-21: 650 mg via INTRAVENOUS
  Filled 2017-12-21: qty 13

## 2017-12-21 MED ORDER — PALONOSETRON HCL INJECTION 0.25 MG/5ML
INTRAVENOUS | Status: AC
Start: 2017-12-21 — End: ?
  Filled 2017-12-21: qty 5

## 2017-12-21 MED ORDER — ATROPINE SULFATE 1 MG/ML IJ SOLN
0.5000 mg | Freq: Once | INTRAMUSCULAR | Status: AC | PRN
Start: 2017-12-21 — End: 2017-12-21
  Administered 2017-12-21: 0.5 mg via INTRAVENOUS

## 2017-12-21 MED ORDER — SODIUM CHLORIDE 0.9 % IV SOLN
Freq: Once | INTRAVENOUS | Status: AC
  Administered 2017-12-21: 10:00:00 via INTRAVENOUS

## 2017-12-21 MED ORDER — IRINOTECAN HCL CHEMO INJECTION 100 MG/5ML
180.0000 mg/m2 | Freq: Once | INTRAVENOUS | Status: AC
  Administered 2017-12-21: 300 mg via INTRAVENOUS
  Filled 2017-12-21: qty 15

## 2017-12-21 MED ORDER — SODIUM CHLORIDE 0.9 % IJ SOLN
10.0000 mL | INTRAMUSCULAR | Status: DC | PRN
  Administered 2017-12-21: 10 mL via INTRAVENOUS
  Filled 2017-12-21: qty 10

## 2017-12-21 MED ORDER — DEXAMETHASONE SODIUM PHOSPHATE 10 MG/ML IJ SOLN
INTRAMUSCULAR | Status: AC
Start: 2017-12-21 — End: ?
  Filled 2017-12-21: qty 1

## 2017-12-21 MED ORDER — DEXAMETHASONE SODIUM PHOSPHATE 10 MG/ML IJ SOLN
10.0000 mg | Freq: Once | INTRAMUSCULAR | Status: AC
  Administered 2017-12-21: 10 mg via INTRAVENOUS

## 2017-12-21 MED ORDER — PALONOSETRON HCL INJECTION 0.25 MG/5ML
0.2500 mg | Freq: Once | INTRAVENOUS | Status: AC
  Administered 2017-12-21: 0.25 mg via INTRAVENOUS

## 2017-12-21 MED ORDER — SODIUM CHLORIDE 0.9 % IV SOLN
300.0000 mg | Freq: Once | INTRAVENOUS | Status: AC
  Administered 2017-12-21: 300 mg via INTRAVENOUS
  Filled 2017-12-21: qty 15

## 2017-12-21 NOTE — Progress Notes (Signed)
[facility] OFFICE PROGRESS NOTE   Diagnosis: Colon cancer  INTERVAL HISTORY:   [name] returns as scheduled.  She completed another cycle of FOLFIRI/panitumumab on 12/07/2017.  No mouth sores, or diarrhea.  She has noted increased leg edema.  She is completing a course of Septra for a left lower extremity cellulitis.  She reports improvement in erythema.  Objective:  Vital signs in last 24 hours:  Blood pressure 125/69, pulse (!) 106, temperature 98.6 F (37 C), temperature source Oral, resp. rate 19, height '5\' 5"'$  (1.651 m), weight 149 lb 1.6 oz (67.6 kg), SpO2 100 %.    HEENT: No thrush or ulcers Resp: Lungs clear bilaterally Cardio: Regular rate and rhythm GI: No hepatomegaly, left lower quadrant colostomy, slight soft fullness in the mid lower abdomen Vascular: Edema throughout both legs with chronic stasis change at the low leg bilaterally Skin: Bandages over areas of paronychia at the fingers  Portacath/PICC-without erythema  Lab Results:  Lab Results  Component Value Date   WBC 7.5 12/21/2017   HGB 10.5 (L) 12/07/2017   HCT 28.2 (L) 12/21/2017   MCV 82.2 12/21/2017   PLT 576 (H) 12/21/2017   NEUTROABS 5.3 12/21/2017    CMP     Component Value Date/Time   NA 133 (L) 12/07/2017 0940   NA 139 09/21/2017 0746   K 3.5 12/07/2017 0940   K 3.3 (L) 09/21/2017 0746   CL 100 12/07/2017 0940   CL 101 12/01/2012 0924   CO2 27 12/07/2017 0940   CO2 28 09/21/2017 0746   GLUCOSE 102 12/07/2017 0940   GLUCOSE 92 09/21/2017 0746   GLUCOSE 94 12/01/2012 0924   BUN 13 12/07/2017 0940   BUN 9.3 09/21/2017 0746   CREATININE 0.52 (L) 12/07/2017 0940   CREATININE 0.57 (L) 11/23/2017 0905   CREATININE 0.6 09/21/2017 0746   CALCIUM 8.2 (L) 12/07/2017 0940   CALCIUM 8.7 09/21/2017 0746   PROT 4.7 (L) 12/07/2017 0940   PROT 6.2 (L) 09/21/2017 0746   ALBUMIN 2.1 (L) 12/07/2017 0940   ALBUMIN 3.2 (L) 09/21/2017 0746   AST 11 12/07/2017 0940   AST 13  11/23/2017 0905   AST 19 09/21/2017 0746   ALT 9 12/07/2017 0940   ALT 9 11/23/2017 0905   ALT 12 09/21/2017 0746   ALKPHOS 62 12/07/2017 0940   ALKPHOS 105 09/21/2017 0746   BILITOT 0.3 12/07/2017 0940   BILITOT 0.2 11/23/2017 0905   BILITOT 0.41 09/21/2017 0746   GFRNONAA >60 12/07/2017 0940   GFRNONAA >60 11/23/2017 0905   GFRAA >60 12/07/2017 0940   GFRAA >60 11/23/2017 0905    Lab Results  Component Value Date   CEA1 2.01 12/07/2017    Medications: I have reviewed the patient's current medications.   Assessment/Plan:  1.Metastatic colorectal cancer-status post an exploratory laparotomy 03/03/2016 revealing a proximal rectal mass, peritoneal carcinomatosis, and liver metastases  Biopsy of peritoneal nodules 03/03/2016 confirmed metastatic adenocarcinoma consistent with a colon primary  Foundation 1 testing-MSI-stable, tumor mutation burden-low, no BRAF NRAS or KRAS mutation  Cycle 1 FOLFIRI/PANITUMUMAB 04/14/2016  Cycle 2 FOLFIRI/panitumumab 04/28/2016  Cycle 3 FOLFIRI/panitumumab 05/12/2016  Cycle 4 FOLFIRI/panitumumab 05/27/2016  Cycle 5 FOLFIRI/panitumumab 06/09/2016  Restaging CT abdomen/pelvis 06/20/2016-no evidence of disease progression, decreased left hepatic lesion  Cycle 6 FOLFIRI/panitumumab 06/23/2016  Cycle 7 FOLFIRI/panitumumab 07/07/2016  Cycle 8 FOLFIRI/panitumumab 07/21/2016  Cycle 9 FOLFIRI/PANITUMUMAB 08/04/2016  Cycle 10 FOLFIRI/panitumumab 08/18/2016  Restaging CT 08/29/2016 with interval decrease in the size of the medial  segment left liver lesion. Lesion identified previously in the rectosigmoid colon not evident on the current study.  5-FU/PANITUMUMAB 09/01/2016  Xeloda 7 days on/7 days off and PANITUMUMAB every 3 weeks 09/15/2016 (awaiting insurance approval for Xeloda 09/15/2016)  CT abdomen/pelvis 12/25/2016-unchanged 5 mm right hepatic lesion, resolution of medial segment left liver lesion, no new liver lesion. Residual soft  tissue fullness in the sigmoid , persistent distal esophageal wall thickening  Continuationof PANITUMUMAB every 3 weeks and Xeloda 7 days on/7 days off  CT 06/19/2017-new cystic/solid lesion in the left adnexa  Xeloda/panitumumab continued  CT 10/08/2016-progression of cystic pelvic mass  Cycle 1 FOLFIRI/Panitumumab 10/21/2017  Cystic pelvic mass aspirated 10/30/2017  Cycle 2 FOLFIRI/panitumumab 11/09/2017  Cycle 3 FOLFIRI/panitumumab 11/23/2017  Cycle 4 FOLFIRI/Panitumumab 12/07/2017  Cycle 5 FOLFIRI/panitumumab 12/21/2017  2.History of aBowel obstruction secondary to #1  3. Chronic back pain-maintained on methadone  4. Essential thrombocytosis  5. Early obstruction of the left ureter noted at the time of surgery 03/03/2016-Dr. Tannenbaum placed a left double-J stent 03/18/2016  New onset right hydronephrosis 10/11/2017  Right ureter stent placement 10/13/2017  6. Abdominal wall cellulitis 03/10/2016, blood cultures positive for coagulase negative staphylococcus-methicillin-resistant, status post treatment with vancomycin and Bactrim   8. Port-A-Cath placement 04/10/2016, interventional radiology  9. Iron deficiency anemia. Feraheme 11/05/2015 , 11/12/2015,and 04/14/2016-persistent anemia and red cell microcytosis, oral iron resumed 11/17/2016-stable  10. Pain/tenderness left posterior iliac region-resolved  11. Hypokalemia-started on potassium replacement 06/09/2016  12. History of skin rashand paronychiasecondary to [facility], she continues minocycline, moisturizers, and fluticasone as needed  13.Acute onset right low back pain 10/11/2017-likely secondary to obstruction of the right kidney;left ureter stent exchange and right ureter stent placement 10/13/2017  14.Left leg pain and edema-bilateral lower extremity venous Doppler negative for DVT1/15/2019 and 10/19/2017. Left common femoral DVT noted on CT venogram 10/27/2017; CT  aspiration of pelvic cystic mass 10/30/2017 with subsequent marked improvement of leg edema.  15. Admission 10/22/2017 with bilateral pulmonary embolism, heparin drip initiated, converted to Lovenox 10/23/2017, converted to Xarelto during the 10/27/2017 hospital admission  16. Admission 10/27/2017 with increased back/leg pain and a fever-blood and urine cultures positive fora resistant E. coli   Disposition: Ms. Arata has completed 4 cycles of salvage therapy with FOLFIRI/panitumumab.  Her overall status is unchanged, but she has developed increased leg edema.  She is scheduled to undergo aspiration of the cystic pelvic mass on 12/23/2017.  Anticoagulation therapy is on hold as of today in anticipation of this procedure.  She will undergo a restaging abdomen/pelvis CT on 12/23/2017.  We will contact her with the CT result.  She will return for an office visit and planned cycle 6 FOLFIRI/panitumumab 01/04/2018.  25 minutes were spent with the patient today.  The majority of the time was used for counseling and coordination of care.  [name] Coder, MD  12/21/2017  9:23 AM

## 2017-12-21 NOTE — Patient Instructions (Signed)
[facility] Discharge Instructions for Patients Receiving Chemotherapy  Today you received the following chemotherapy agents Irinotecan, Leucovorin, Adrucil  To help prevent nausea and vomiting after your treatment, we encourage you to take your nausea medication as directed.   If you develop nausea and vomiting that is not controlled by your nausea medication, call the clinic.   BELOW ARE SYMPTOMS THAT SHOULD BE REPORTED IMMEDIATELY:  *FEVER GREATER THAN 100.5 F  *CHILLS WITH OR WITHOUT FEVER  NAUSEA AND VOMITING THAT IS NOT CONTROLLED WITH YOUR NAUSEA MEDICATION  *UNUSUAL SHORTNESS OF BREATH  *UNUSUAL BRUISING OR BLEEDING  TENDERNESS IN MOUTH AND THROAT WITH OR WITHOUT PRESENCE OF ULCERS  *URINARY PROBLEMS  *BOWEL PROBLEMS  UNUSUAL RASH Items with * indicate a potential emergency and should be followed up as soon as possible.  Feel free to call the clinic should you have any questions or concerns. The clinic phone number is [phone].  Please show the CHEMO ALERT CARD at check-in to the Emergency Department and triage nurse.   

## 2017-12-23 ENCOUNTER — Ambulatory Visit (HOSPITAL_COMMUNITY)
Admission: RE | Admit: 2017-12-23 | Discharge: 2017-12-23 | Disposition: A | Discharge status: Home / Self Care | Payer: BLUE CROSS/BLUE SHIELD | Source: Ambulatory Visit | Attending: Nurse Practitioner | Admitting: Nurse Practitioner

## 2017-12-23 ENCOUNTER — Telehealth: Payer: Self-pay | Admitting: *Deleted

## 2017-12-23 ENCOUNTER — Encounter (HOSPITAL_COMMUNITY): Payer: Self-pay

## 2017-12-23 ENCOUNTER — Inpatient Hospital Stay: Payer: BLUE CROSS/BLUE SHIELD

## 2017-12-23 VITALS — BP 118/62 | HR 89 | Temp 98.2°F | Resp 18

## 2017-12-23 DIAGNOSIS — G8929 Other chronic pain: Secondary | ICD-10-CM | POA: Diagnosis not present

## 2017-12-23 DIAGNOSIS — K56609 Unspecified intestinal obstruction, unspecified as to partial versus complete obstruction: Secondary | ICD-10-CM | POA: Diagnosis not present

## 2017-12-23 DIAGNOSIS — C19 Malignant neoplasm of rectosigmoid junction: Secondary | ICD-10-CM

## 2017-12-23 DIAGNOSIS — Z8505 Personal history of malignant neoplasm of liver: Secondary | ICD-10-CM | POA: Insufficient documentation

## 2017-12-23 DIAGNOSIS — R600 Localized edema: Secondary | ICD-10-CM | POA: Insufficient documentation

## 2017-12-23 DIAGNOSIS — N9489 Other specified conditions associated with female genital organs and menstrual cycle: Secondary | ICD-10-CM | POA: Insufficient documentation

## 2017-12-23 DIAGNOSIS — Z79899 Other long term (current) drug therapy: Secondary | ICD-10-CM | POA: Diagnosis not present

## 2017-12-23 DIAGNOSIS — C189 Malignant neoplasm of colon, unspecified: Secondary | ICD-10-CM

## 2017-12-23 DIAGNOSIS — Z7901 Long term (current) use of anticoagulants: Secondary | ICD-10-CM | POA: Diagnosis not present

## 2017-12-23 DIAGNOSIS — Z8579 Personal history of other malignant neoplasms of lymphoid, hematopoietic and related tissues: Secondary | ICD-10-CM | POA: Insufficient documentation

## 2017-12-23 DIAGNOSIS — C20 Malignant neoplasm of rectum: Secondary | ICD-10-CM

## 2017-12-23 DIAGNOSIS — T17998A Other foreign object in respiratory tract, part unspecified causing other injury, initial encounter: Secondary | ICD-10-CM

## 2017-12-23 HISTORY — DX: Other foreign object in respiratory tract, part unspecified causing other injury, initial encounter: T17.998A

## 2017-12-23 LAB — CBC
HCT: 29.2 % — ABNORMAL LOW (ref 36.0–46.0)
Hemoglobin: 9.3 g/dL — ABNORMAL LOW (ref 12.0–15.0)
MCH: 26.1 pg (ref 26.0–34.0)
MCHC: 31.8 g/dL (ref 30.0–36.0)
MCV: 81.8 fL (ref 78.0–100.0)
PLATELETS: 525 10*3/uL — AB (ref 150–400)
RBC: 3.57 MIL/uL — ABNORMAL LOW (ref 3.87–5.11)
RDW: 17.5 % — AB (ref 11.5–15.5)
WBC: 4.3 10*3/uL (ref 4.0–10.5)

## 2017-12-23 LAB — PROTIME-INR
INR: 1.26
Prothrombin Time: 15.7 seconds — ABNORMAL HIGH (ref 11.4–15.2)

## 2017-12-23 LAB — APTT: aPTT: 31 seconds (ref 24–36)

## 2017-12-23 MED ORDER — SODIUM CHLORIDE 0.9% FLUSH
10.0000 mL | INTRAVENOUS | Status: DC | PRN
  Administered 2017-12-23: 10 mL
  Filled 2017-12-23: qty 10

## 2017-12-23 MED ORDER — LIDOCAINE HCL (PF) 1 % IJ SOLN
INTRAMUSCULAR | Status: AC | PRN
  Administered 2017-12-23: 30 mL

## 2017-12-23 MED ORDER — IOPAMIDOL (ISOVUE-300) INJECTION 61%
INTRAVENOUS | Status: AC
  Filled 2017-12-23: qty 100

## 2017-12-23 MED ORDER — PROCHLORPERAZINE MALEATE 10 MG PO TABS: 10.0000 mg | ORAL_TABLET | Freq: Four times a day (QID) | ORAL | 2 refills | Status: DC | PRN

## 2017-12-23 MED ORDER — SODIUM CHLORIDE 0.9 % IV SOLN
INTRAVENOUS | Status: AC
  Filled 2017-12-23: qty 250

## 2017-12-23 MED ORDER — IOPAMIDOL (ISOVUE-300) INJECTION 61%
100.0000 mL | Freq: Once | INTRAVENOUS | Status: AC | PRN
  Administered 2017-12-23: 80 mL via INTRAVENOUS

## 2017-12-23 MED ORDER — HEPARIN SOD (PORK) LOCK FLUSH 100 UNIT/ML IV SOLN
500.0000 [IU] | Freq: Once | INTRAVENOUS | Status: AC | PRN
  Administered 2017-12-23: 500 [IU]
  Filled 2017-12-23: qty 5

## 2017-12-23 MED ORDER — FENTANYL CITRATE (PF) 100 MCG/2ML IJ SOLN
INTRAMUSCULAR | Status: AC
  Filled 2017-12-23: qty 4

## 2017-12-23 MED ORDER — MIDAZOLAM HCL 2 MG/2ML IJ SOLN
INTRAMUSCULAR | Status: AC
  Filled 2017-12-23: qty 4

## 2017-12-23 MED ORDER — MIDAZOLAM HCL 2 MG/2ML IJ SOLN
INTRAMUSCULAR | Status: AC | PRN
  Administered 2017-12-23 (×3): 1 mg via INTRAVENOUS

## 2017-12-23 MED ORDER — ONDANSETRON HCL 8 MG PO TABS: 8.0000 mg | ORAL_TABLET | Freq: Three times a day (TID) | ORAL | 2 refills | Status: AC | PRN

## 2017-12-23 MED ORDER — SODIUM CHLORIDE 0.9 % IV SOLN
INTRAVENOUS | Status: DC
  Administered 2017-12-23: 08:00:00 via INTRAVENOUS

## 2017-12-23 MED ORDER — FENTANYL CITRATE (PF) 100 MCG/2ML IJ SOLN
INTRAMUSCULAR | Status: AC | PRN
  Administered 2017-12-23 (×2): 50 ug via INTRAVENOUS

## 2017-12-23 NOTE — Sedation Documentation (Signed)
550cc of fluid aspirated from pelvic cyst

## 2017-12-23 NOTE — Discharge Instructions (Signed)
Moderate Conscious Sedation, Adult, Care After These instructions provide you with information about caring for yourself after your procedure. Your health care provider may also give you more specific instructions. Your treatment has been planned according to current medical practices, but problems sometimes occur. Call your health care provider if you have any problems or questions after your procedure. What can I expect after the procedure? After your procedure, it is common:  To feel sleepy for several hours.  To feel clumsy and have poor balance for several hours.  To have poor judgment for several hours.  To vomit if you eat too soon.  Follow these instructions at home: For at least 24 hours after the procedure:   Do not: ? Participate in activities where you could fall or become injured. ? Drive. ? Use heavy machinery. ? Drink alcohol. ? Take sleeping pills or medicines that cause drowsiness. ? Make important decisions or sign legal documents. ? Take care of children on your own.  Rest. Eating and drinking  Follow the diet recommended by your health care provider.  If you vomit: ? Drink water, juice, or soup when you can drink without vomiting. ? Make sure you have little or no nausea before eating solid foods. General instructions  Have a responsible adult stay with you until you are awake and alert.  Take over-the-counter and prescription medicines only as told by your health care provider.  If you smoke, do not smoke without supervision.  Keep all follow-up visits as told by your health care provider. This is important. Contact a health care provider if:  You keep feeling nauseous or you keep vomiting.  You feel light-headed.  You develop a rash.  You have a fever. Get help right away if:  You have trouble breathing. This information is not intended to replace advice given to you by your health care provider. Make sure you discuss any questions you have  with your health care provider. Document Released: 07/06/2013 Document Revised: 02/18/2016 Document Reviewed: 01/05/2016 Elsevier Interactive Patient Education  2018 [place] dressing lower back tomorrow after 1000, any problems follow-up with doctor.

## 2017-12-23 NOTE — Telephone Encounter (Signed)
FYI "[facility] does not have my nausea medications that were to be refilled on Monday.  Can you help me?"   No refills identified with 12-21-2017 F/U visit.   "I do not have the bottle to know which one I need but refill both.  I have one pill left." Anti-emetic refills sent eRx to [facility] at this time.

## 2017-12-23 NOTE — H&P (Signed)
Referring Physician(s): Sherrill,B  Supervising Physician: Daryll Brod  Patient Status:  [facility] OP  Chief Complaint:  "I'm getting my cyst drained"  Subjective: Patient familiar to IR service from prior Port-A-Cath in 2017 and pelvic cyst aspiration on 10/30/17 which revealed malignant cells consistent with adenocarcinoma.  Cultures were negative.  She has a history of metastatic colon cancer and presents again today for repeat pelvic cyst aspiration.  She currently denies high fever, headache, chest pain, worsening dyspnea, cough, significant abdominal pain, abnormal bleeding.  She does have some urinary incontinence, chronic back pain , intermittent nausea as well as significant lower extremity edema. Past Medical History:  Diagnosis Date  . Chronic back pain    lumbar    . Colostomy in place [facility])   . 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 History of chemotherapy last chemo 09-21-17  . Hypokalemia    takes potassium  . Iron deficiency anemia 04/14/2016   treated w/ Iron infusions  . Liver metastasis ([place])    secondary to colorectal cancer  . Metastatic colorectal cancer [facility]) dx 03/03/2016--- oncologist-- dr Benay Spice   metastatic colorectal carcinoma--  s/p  exp. lap. for bowel obstruction--  rectal mass, peritoneal carcinomatosis, liver mets---  chemotherapy  . Spinal headache 12/21/2012  . Tachycardia    persistant since discharged from hospital 06/ 2017 due to anemia and deconditioning;  as of 09-02-2016 per pt no issues w/ heart racing in the past few weeks  . Ureteral obstruction, left   . Wears contact lenses      Allergies: Patient has no known allergies.  Medications: Prior to Admission medications   Medication Sig Start Date End Date Taking? Authorizing Provider  DULoxetine (CYMBALTA) 60 MG capsule Take 1 capsule (60 mg total) by mouth every evening. 06/15/17  Yes Ladell Pier, MD  ferrous sulfate 325 (65 FE) MG EC tablet Take 1 tablet  (325 mg total) by mouth daily. 11/17/16  Yes Ladell Pier, MD  fluticasone (CUTIVATE) 0.05 % cream Apply topically 2 (two) times daily. 11/17/16  Yes Ladell Pier, MD  furosemide (LASIX) 20 MG tablet Take 1 tablet (20 mg total) by mouth daily. 11/24/17  Yes Ladell Pier, MD  methadone (DOLOPHINE) 10 MG tablet Take 10 mg by mouth 3 (three) times daily.    Yes [provider]  minocycline (MINOCIN) 100 MG capsule Take 1 capsule (100 mg total) by mouth 2 (two) times daily. 12/08/16  Yes Ladell Pier, MD  morphine (MSIR) 15 MG tablet Take 1-2 tablets (15-30 mg total) by mouth every 4 (four) hours as needed for severe pain. 11/01/17  Yes Georgette Shell, MD  ondansetron (ZOFRAN) 8 MG tablet Take 1 tablet (8 mg total) by mouth every 8 (eight) hours as needed for nausea or vomiting. 04/24/17  Yes Owens Shark, NP  phenazopyridine (PYRIDIUM) 200 MG tablet Take 1 tablet (200 mg total) by mouth 3 (three) times daily as needed for pain. 10/13/17 10/13/18 Yes Ceasar Mons, MD  polyethylene glycol [facility] Floria Raveling) packet Take 17 g by mouth daily as needed for mild constipation.    Yes [provider]  potassium chloride (K-DUR) 10 MEQ tablet Take 1 tablet (10 mEq total) by mouth 2 (two) times daily. 09/21/17  Yes Ladell Pier, MD  lidocaine-prilocaine (EMLA) cream Apply to port site one hour prior to use. Do not rub in. Cover with plastic. 03/02/17   Owens Shark, NP  Meth-Hyo-M Bl-Na Phos-Ph Sal (URIBEL) 118 MG CAPS Take 1 capsule (118 mg total) by mouth 3 times/day as needed-between meals & bedtime. 03/16/17   Carolan Clines, MD  Multiple Vitamin (MULTIVITAMIN WITH MINERALS) TABS tablet Take 1 tablet by mouth daily. 11/01/17   Georgette Shell, MD  omeprazole (PRILOSEC) 20 MG capsule Take 1 capsule (20 mg total) by mouth daily. Patient taking differently: Take 20 mg by mouth daily as needed (reflux).  10/21/17   Ladell Pier, MD  prochlorperazine  (COMPAZINE) 10 MG tablet Take 1 tablet (10 mg total) by mouth every 6 (six) hours as needed for nausea or vomiting. 09/15/16   Owens Shark, NP  rivaroxaban (XARELTO) 20 MG TABS tablet Take 1 tablet (20 mg total) by mouth daily with supper. 11/24/17   Ladell Pier, MD  sorbitol 70 % solution Take 30 cc twice today, then 30 cc daily 10/19/17   Owens Shark, NP  sulfamethoxazole-trimethoprim (BACTRIM DS,SEPTRA DS) 800-160 MG tablet Take 1 tablet by mouth 2 (two) times daily. 12/16/17   Owens Shark, NP     Vital Signs: BP 122/86   Pulse 99   Temp 98.4 F (36.9 C) (Oral)   Resp (!) 22   Ht 5\' 5"  (1.651 m)   Wt 149 lb 1.6 oz (67.6 kg)   SpO2 100%   BMI 24.81 kg/m   Physical Exam awake, alert.  Chest clear to auscultation bilaterally.  Clean, intact right chest wall Port-A-Cath.  Heart with regular rate and rhythm.  Abdomen soft, left lower quadrant colostomy intact, some fullness in the mid lower abdominal region, positive bowel sounds.  Significant bilateral lower extremity edema.  Imaging: No results found.  Labs:  CBC: Recent Labs    11/01/17 1355 11/02/17 0907  11/23/17 0905 12/07/17 0940 12/21/17 0816 12/23/17 0719  WBC 6.3 6.7   < > 7.3 6.2 7.5 4.3  HGB 10.5* 10.9*  --   --  10.5*  --  9.3*  HCT 31.6* 33.7*   < > 33.6* 32.3* 28.2* 29.2*  PLT 211 306   < > 506* 563* 576* 525*   < > = values in this interval not displayed.    COAGS: Recent Labs    10/30/17 0259 12/23/17 0719  INR 0.97 1.26  APTT  --  31    BMP: Recent Labs    11/09/17 0902 11/23/17 0905 12/07/17 0940 12/21/17 0816  NA 135* 135* 133* 130*  K 4.0 3.9 3.5 3.3*  CL 99 100 100 98  CO2 27 28 27 24   GLUCOSE 108 92 102 116  BUN 11 16 13 16   CALCIUM 9.0 8.4 8.2* 8.1*  CREATININE 0.62 0.57* 0.52* 0.63  GFRNONAA >60 >60 >60 >60  GFRAA >60 >60 >60 >60    LIVER FUNCTION TESTS: Recent Labs    11/09/17 0902 11/23/17 0905 12/07/17 0940 12/21/17 0816  BILITOT 0.4 0.2 0.3 0.3  AST 13  13 11 13   ALT 9 9 9 9   ALKPHOS 90 69 62 61  PROT 6.2* 4.8* 4.7* 4.6*  ALBUMIN 2.8* 2.2* 2.1* 1.9*    Assessment and Plan: Patient with history of metastatic colon cancer and prior aspiration of a pelvic cyst on 10/30/17 revealing adenocarcinoma.  She presents again today for CT of the pelvis with possible repeat CT-guided aspiration of the pelvic cyst.  Details/risks of procedure, including but not limited to, internal bleeding, infection, injury to adjacent structures, discussed with patient and spouse with their understanding  and consent.   Electronically Signed: D. Rowe Robert, PA-C 12/23/2017, 8:28 AM   I spent a total of 20 minutes at the the patient's bedside AND on the patient's hospital floor or unit, greater than 50% of which was counseling/coordinating care for CT-guided aspiration of pelvic cyst

## 2017-12-23 NOTE — Procedures (Signed)
Met colon ca  S/p CT PELVIC COMPLEX CYSTIC MASS ASPIRATION   550CC BLOODLY DEBRIS FILLED FLUID ASPIRATED  No comp Stable Full report in pacs

## 2017-12-28 ENCOUNTER — Telehealth: Payer: Self-pay | Admitting: *Deleted

## 2017-12-28 NOTE — Telephone Encounter (Signed)
Call from pt reporting BLE swelling and "oozing" persists. Reviewed with Dr. Benay Spice: Schedule office visit for 12/30/17. Called pt with appt.

## 2017-12-30 ENCOUNTER — Inpatient Hospital Stay: Payer: BLUE CROSS/BLUE SHIELD | Attending: Oncology | Admitting: Oncology

## 2017-12-30 VITALS — BP 142/85 | HR 117 | Temp 98.4°F | Resp 16 | Ht 65.0 in | Wt 148.7 lb

## 2017-12-30 DIAGNOSIS — Z5112 Encounter for antineoplastic immunotherapy: Secondary | ICD-10-CM | POA: Diagnosis present

## 2017-12-30 DIAGNOSIS — R110 Nausea: Secondary | ICD-10-CM | POA: Insufficient documentation

## 2017-12-30 DIAGNOSIS — C20 Malignant neoplasm of rectum: Secondary | ICD-10-CM | POA: Diagnosis not present

## 2017-12-30 DIAGNOSIS — C19 Malignant neoplasm of rectosigmoid junction: Secondary | ICD-10-CM

## 2017-12-30 DIAGNOSIS — Z7189 Other specified counseling: Secondary | ICD-10-CM | POA: Insufficient documentation

## 2017-12-30 DIAGNOSIS — L03116 Cellulitis of left lower limb: Secondary | ICD-10-CM | POA: Insufficient documentation

## 2017-12-30 DIAGNOSIS — Z933 Colostomy status: Secondary | ICD-10-CM | POA: Diagnosis not present

## 2017-12-30 DIAGNOSIS — R1909 Other intra-abdominal and pelvic swelling, mass and lump: Secondary | ICD-10-CM

## 2017-12-30 DIAGNOSIS — C786 Secondary malignant neoplasm of retroperitoneum and peritoneum: Secondary | ICD-10-CM | POA: Diagnosis not present

## 2017-12-30 DIAGNOSIS — R601 Generalized edema: Secondary | ICD-10-CM | POA: Insufficient documentation

## 2017-12-30 DIAGNOSIS — N1330 Unspecified hydronephrosis: Secondary | ICD-10-CM | POA: Insufficient documentation

## 2017-12-30 DIAGNOSIS — D509 Iron deficiency anemia, unspecified: Secondary | ICD-10-CM | POA: Diagnosis not present

## 2017-12-30 DIAGNOSIS — C787 Secondary malignant neoplasm of liver and intrahepatic bile duct: Secondary | ICD-10-CM | POA: Diagnosis not present

## 2017-12-30 DIAGNOSIS — Z5111 Encounter for antineoplastic chemotherapy: Secondary | ICD-10-CM | POA: Diagnosis present

## 2017-12-30 NOTE — Progress Notes (Signed)
DISCONTINUE OFF PATHWAY REGIMEN - Colorectal   [id number]:FOLFIRI (q14d):   A cycle is every 14 days:     Irinotecan      Leucovorin      5-Fluorouracil      5-Fluorouracil   **Always confirm dose/schedule in your pharmacy ordering system**    REASON: Disease Progression PRIOR TREATMENT: Off Pathway: FOLFIRI (q14d) TREATMENT RESPONSE: Progressive Disease (PD)  START ON PATHWAY REGIMEN - Colorectal     A cycle is every 14 days:     Oxaliplatin      Leucovorin      5-Fluorouracil      5-Fluorouracil      Bevacizumab   **Always confirm dose/schedule in your pharmacy ordering system**    Patient Characteristics: Metastatic Colorectal, Second Line, KRAS/NRAS Wild-Type, BRAF Wild-Type/Unknown, Prior Anti-EGFR Therapy, Bevacizumab Eligible Current evidence of distant metastases<= Yes AJCC T Category: Staged < 8th Ed. AJCC N Category: Staged < 8th Ed. AJCC M Category: Staged < 8th Ed. AJCC 8 Stage Grouping: Staged < 8th Ed. BRAF Mutation Status: Wild Type (no mutation) KRAS/NRAS Mutation Status: Wild Type (no mutation) Line of therapy: Second Line  Intent of Therapy: Non-Curative / Palliative Intent, Discussed with Patient

## 2017-12-30 NOTE — Progress Notes (Signed)
[place] OFFICE PROGRESS NOTE   Diagnosis: Colon cancer  INTERVAL HISTORY:   Carolyn Barton returns prior to a scheduled visit.  She has increased leg edema with erythema at the lower legs and weeping of fluid.  No fever.  She reports a good appetite.  She was last treated with FOLFIRI/panitumumab on 12/21/2017.  She has intermittent nausea, relieved with ondansetron. CT of the pelvis 12/23/2017 feels a complex cystic mass in the central pelvis with mass-effect on the iliac veins bilaterally.  Right hydronephrosis with bilateral ureter stents.  Extensive body wall edema. She underwent aspiration of the cystic mass on 12/23/2017.  550 cc of fluid were removed. The leg edema did not improve after this procedure. Objective:  Vital signs in last 24 hours:  Blood pressure (!) 142/85, pulse (!) 117, temperature 98.4 F (36.9 C), temperature source Oral, resp. rate 16, height '5\' 5"'$  (1.651 m), weight 148 lb 11.2 oz (67.4 kg), SpO2 100 %.    HEENT: No thrush or ulcers Resp: Clear bilaterally Cardio: Regular rate and rhythm GI: No hepatomegaly, left lower quadrant colostomy, nontender Vascular: Tense edema throughout the legs bilaterally  Skin: Erythema with weeping of fluid at the low leg bilaterally  Portacath/PICC-without erythema  Lab Results:  Lab Results  Component Value Date   WBC 4.3 12/23/2017   HGB 9.3 (L) 12/23/2017   HCT 29.2 (L) 12/23/2017   MCV 81.8 12/23/2017   PLT 525 (H) 12/23/2017   NEUTROABS 5.3 12/21/2017    CMP     Component Value Date/Time   NA 130 (L) 12/21/2017 0816   NA 139 09/21/2017 0746   K 3.3 (L) 12/21/2017 0816   K 3.3 (L) 09/21/2017 0746   CL 98 12/21/2017 0816   CL 101 12/01/2012 0924   CO2 24 12/21/2017 0816   CO2 28 09/21/2017 0746   GLUCOSE 116 12/21/2017 0816   GLUCOSE 92 09/21/2017 0746   GLUCOSE 94 12/01/2012 0924   BUN 16 12/21/2017 0816   BUN 9.3 09/21/2017 0746   CREATININE 0.63 12/21/2017 0816   CREATININE 0.6  09/21/2017 0746   CALCIUM 8.1 (L) 12/21/2017 0816   CALCIUM 8.7 09/21/2017 0746   PROT 4.6 (L) 12/21/2017 0816   PROT 6.2 (L) 09/21/2017 0746   ALBUMIN 1.9 (L) 12/21/2017 0816   ALBUMIN 3.2 (L) 09/21/2017 0746   AST 13 12/21/2017 0816   AST 19 09/21/2017 0746   ALT 9 12/21/2017 0816   ALT 12 09/21/2017 0746   ALKPHOS 61 12/21/2017 0816   ALKPHOS 105 09/21/2017 0746   BILITOT 0.3 12/21/2017 0816   BILITOT 0.41 09/21/2017 0746   GFRNONAA >60 12/21/2017 0816   GFRAA >60 12/21/2017 0816    Lab Results  Component Value Date   CEA1 2.01 12/07/2017    Lab Results  Component Value Date   INR 1.26 12/23/2017     Medications: I have reviewed the patient's current medications.   Assessment/Plan: 1.Metastatic colorectal cancer-status post an exploratory laparotomy 03/03/2016 revealing a proximal rectal mass, peritoneal carcinomatosis, and liver metastases  Biopsy of peritoneal nodules 03/03/2016 confirmed metastatic adenocarcinoma consistent with a colon primary  Foundation 1 testing-MSI-stable, tumor mutation burden-low, no BRAF NRAS or KRAS mutation  Cycle 1 FOLFIRI/PANITUMUMAB 04/14/2016  Cycle 2 FOLFIRI/panitumumab 04/28/2016  Cycle 3 FOLFIRI/panitumumab 05/12/2016  Cycle 4 FOLFIRI/panitumumab 05/27/2016  Cycle 5 FOLFIRI/panitumumab 06/09/2016  Restaging CT abdomen/pelvis 06/20/2016-no evidence of disease progression, decreased left hepatic lesion  Cycle 6 FOLFIRI/panitumumab 06/23/2016  Cycle 7 FOLFIRI/panitumumab 07/07/2016  Cycle  8 FOLFIRI/panitumumab 07/21/2016  Cycle 9 FOLFIRI/PANITUMUMAB 08/04/2016  Cycle 10 FOLFIRI/panitumumab 08/18/2016  Restaging CT 08/29/2016 with interval decrease in the size of the medial segment left liver lesion. Lesion identified previously in the rectosigmoid colon not evident on the current study.  5-FU/PANITUMUMAB 09/01/2016  Xeloda 7 days on/7 days off and PANITUMUMAB every 3 weeks 09/15/2016 (awaiting insurance approval  for Xeloda 09/15/2016)  CT abdomen/pelvis 12/25/2016-unchanged 5 mm right hepatic lesion, resolution of medial segment left liver lesion, no new liver lesion. Residual soft tissue fullness in the sigmoid , persistent distal esophageal wall thickening  Continuationof PANITUMUMAB every 3 weeks and Xeloda 7 days on/7 days off  CT 06/19/2017-new cystic/solid lesion in the left adnexa  Xeloda/panitumumab continued  CT 10/08/2016-progression of cystic pelvic mass  Cycle 1 FOLFIRI/Panitumumab 10/21/2017  Cystic pelvic mass aspirated 10/30/2017  Cycle 2 FOLFIRI/panitumumab 11/09/2017  Cycle 3 FOLFIRI/panitumumab 11/23/2017  Cycle 4 FOLFIRI/Panitumumab 12/07/2017  Cycle 5 FOLFIRI/panitumumab 12/21/2017  CT pelvis 12/23/2017- complex cystic pelvic mass with mass-effect on the pelvic vasculature  CT aspiration of cystic pelvic mass 12/23/2017  2.History of aBowel obstruction secondary to #1  3. Chronic back pain-maintained on methadone  4. Essential thrombocytosis  5. Early obstruction of the left ureter noted at the time of surgery 03/03/2016-Dr. Tannenbaum placed a left double-J stent 03/18/2016  New onset right hydronephrosis 10/11/2017  Right ureter stent placement 10/13/2017  6. Abdominal wall cellulitis 03/10/2016, blood cultures positive for coagulase negative staphylococcus-methicillin-resistant, status post treatment with vancomycin and Bactrim   8. Port-A-Cath placement 04/10/2016, interventional radiology  9. Iron deficiency anemia. Feraheme 11/05/2015 , 11/12/2015,and 04/14/2016-persistent anemia and red cell microcytosis, oral iron resumed 11/17/2016-stable  10. Pain/tenderness left posterior iliac region-resolved  11. Hypokalemia-started on potassium replacement 06/09/2016  12. History of skin rashand paronychiasecondary to [facility], she continues minocycline, moisturizers, and fluticasone as needed  13.Acute onset right low back pain  10/11/2017-likely secondary to obstruction of the right kidney;left ureter stent exchange and right ureter stent placement 10/13/2017  14.Left leg pain and edema-bilateral lower extremity venous Doppler negative for DVT1/15/2019 and 10/19/2017. Left common femoral DVT noted on CT venogram 10/27/2017;CT aspiration of pelvic cystic mass 10/30/2017 with subsequent marked improvement of leg edema.  15. Admission 10/22/2017 with bilateral pulmonary embolism, heparin drip initiated, converted to Lovenox 10/23/2017, converted to Xarelto during the 10/27/2017 hospital admission  16. Admission 10/27/2017 with increased back/leg pain and a fever-blood and urine cultures positive fora resistant E. coli   [name] has metastatic colorectal cancer.  She has completed 5 cycles of FOLFIRI/panitumumab since resuming treatment in January.  The leg edema has progressed.  She did not gain significant relief of edema following aspiration of the cystic pelvic mass last week.  I suspect the edema is secondary to vascular/lymphatic obstruction by pelvic tumor.  I discussed treatment options with Carolyn Barton and her family.  She will elevate the legs.  She plans to obtain a lift chair as soon as possible.  We will refer her to the physical therapy lymphedema clinic to see if she may be a candidate for support stockings, manual wrapping, or mechanical compression.  We decided to discontinue the FOLFIRI/panitumumab and switch to FOLFOX/Avastin.  We reviewed the potential toxicities associated with oxaliplatin including the chance of an allergic reaction and neuropathy.  We discussed the allergic reaction, hypertension, bleeding, thromboembolic disease, bowel perforation, renal toxicity, and CNS toxicity associated with Avastin.  She agrees to proceed.  Carolyn Barton will return for FOLFIRI/Avastin and an office visit 01/04/2018.  40 minutes were spent with the patient today.  The majority of the time was used for  counseling and coordination of care.  [name] Coder, MD  12/30/2017  11:31 AM

## 2017-12-30 NOTE — Progress Notes (Signed)
Called [facility], scheduled lymphedema clinic appointment for 01/01/18 @ 1015. Pt aware.

## 2018-01-01 ENCOUNTER — Encounter: Payer: Self-pay | Admitting: Physical Therapy

## 2018-01-01 ENCOUNTER — Ambulatory Visit: Payer: BLUE CROSS/BLUE SHIELD | Attending: Oncology | Admitting: Physical Therapy

## 2018-01-01 ENCOUNTER — Telehealth: Payer: Self-pay | Admitting: *Deleted

## 2018-01-01 ENCOUNTER — Other Ambulatory Visit: Payer: Self-pay

## 2018-01-01 DIAGNOSIS — R293 Abnormal posture: Secondary | ICD-10-CM | POA: Diagnosis present

## 2018-01-01 DIAGNOSIS — M6281 Muscle weakness (generalized): Secondary | ICD-10-CM

## 2018-01-01 DIAGNOSIS — I890 Lymphedema, not elsewhere classified: Secondary | ICD-10-CM

## 2018-01-01 NOTE — Therapy (Signed)
[place], [place], [address] Phone: [phone]   Fax:  [phone]  Physical Therapy Evaluation  Patient Details  Name: Carolyn Barton MRN: [id number] Date of Birth: [birth year] Referring Provider: Dr. Benay Spice    Encounter Date: 01/01/2018  PT End of Session - 01/01/18 1323    Visit Number  1    Number of Visits  17    Date for PT Re-Evaluation  03/03/18    PT Start Time  1025    PT Stop Time  1115    PT Time Calculation (min)  50 min    Activity Tolerance  Patient tolerated treatment well    Behavior During Therapy  [facility] for tasks assessed/performed       Past Medical History:  Diagnosis Date  . Chronic back pain    lumbar    . Colostomy in place [facility])   . 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 History of chemotherapy last chemo 09-21-17  . Hypokalemia    takes potassium  . Iron deficiency anemia 04/14/2016   treated w/ Iron infusions  . Liver metastasis ([place])    secondary to colorectal cancer  . Metastatic colorectal cancer [facility]) dx 03/03/2016--- oncologist-- dr Benay Spice   metastatic colorectal carcinoma--  s/p  exp. lap. for bowel obstruction--  rectal mass, peritoneal carcinomatosis, liver mets---  chemotherapy  . Spinal headache 12/21/2012  . Tachycardia    persistant since discharged from hospital 06/ 2017 due to anemia and deconditioning;  as of 09-02-2016 per pt no issues w/ heart racing in the past few weeks  . Ureteral obstruction, left   . Wears contact lenses     Past Surgical History:  Procedure Laterality Date  . BIOPSY N/A 03/03/2016   Procedure: BIOPSY OF PERITONEAL NODULE;  Surgeon: Erroll Luna, MD;  Location: [place];  Service: General;  Laterality: N/A;  . COLOSTOMY N/A 03/03/2016   Procedure: DIVERTING SIGMOID COLOSTOMY;  Surgeon: Erroll Luna, MD;  Location: [place];  Service: General;  Laterality: N/A;  . CYSTOSCOPY W/ RETROGRADES Right 10/13/2017    Procedure: CYSTOSCOPY WITH RETROGRADE PYELOGRAM/ STENT PLACEMENT;  Surgeon: Ceasar Mons, MD;  Location: [facility];  Service: Urology;  Laterality: Right;  ONLY NEEDS 30 MIN FOR BOTH PROCEDURES  . CYSTOSCOPY W/ URETERAL STENT PLACEMENT Left 10/09/2016   Procedure: CYSTOSCOPY WITH LEFT STENT REPLACEMENT 23F POLARIS STENT 24CM;  Surgeon: Carolan Clines, MD;  Location: [facility];  Service: Urology;  Laterality: Left;  . CYSTOSCOPY W/ URETERAL STENT PLACEMENT Left 03/16/2017   Procedure: CYSTOSCOPY WITH RETROGRADE PYELOGRAM WITH LEFT  STENT REMOVAL AND REPLACEMENT;  Surgeon: Carolan Clines, MD;  Location: [facility];  Service: Urology;  Laterality: Left;  . CYSTOSCOPY W/ URETERAL STENT PLACEMENT Left 10/13/2017   Procedure: CYSTOSCOPY WITH STENT REPLACEMENT;  Surgeon: Ceasar Mons, MD;  [facility];  Service: Urology;  Laterality: Left;  . CYSTOSCOPY WITH FULGERATION N/A 10/13/2017   Procedure: CYSTOSCOPY WITH FULGERATION;  Surgeon: Ceasar Mons, MD;  [facility];  Service: Urology;  Laterality: N/A;  . CYSTOSCOPY WITH RETROGRADE PYELOGRAM, URETEROSCOPY AND STENT PLACEMENT Left 03/18/2016   Procedure: CYSTOSCOPY WITH LEFT  RETROGRADE PYELOGRAM,  AND  POLARIS STENT PLACEMENT;  Surgeon: Carolan Clines, MD;  Location: [facility];  Service: Urology;  Laterality: Left;  . LAPAROTOMY N/A 03/03/2016   Procedure: EXPLORATORY LAPAROTOMY;  Surgeon: Erroll Luna, MD;  Location:  [facility];  Service: General;  Laterality: N/A;  . LUMBAR LAMINECTOMY  11/11/2012   L4-5  and Resection synovial cyst  . MAXIMUM ACCESS (MAS)POSTERIOR LUMBAR INTERBODY FUSION (PLIF) 1 LEVEL N/A 01/11/2014   Procedure: FOR MAXIMUM ACCESS (MAS) POSTERIOR LUMBAR INTERBODY FUSION Lumbar Five Sacral One;  Surgeon: [name], MD;  Location: [place];  Service: Neurosurgery;  Laterality: N/A;  FOR  MAXIMUM ACCESS (MAS) POSTERIOR LUMBAR INTERBODY FUSION Lumbar Five Sacral One  . PORTACATH PLACEMENT  04/10/2016  . REPAIR CEREBROSPINAL FLUID LEAK POST RESECTION SYNOVIAL CYST  12/21/2012  . REVISION LUMBAR HARDWARE  01/26/2014  . TRANSTHORACIC ECHOCARDIOGRAM  06/17/2016   grade 1 diastolic function , ef 04-88%/  trivial TR  . WISDOM TOOTH EXTRACTION      There were no vitals filed for this visit.   Subjective Assessment - 01/01/18 1030    Subjective  changed in chemotherapy in January and that seemed to increase the swelling in her legs.  She has a mass in her abdomen that is pressing on the vein that goes to her legs her legs. She will be be changing her chemotherapy again on Monday      Patient is accompained by:  Family member    Pertinent History  Colon cancer diagnosed in June 2017 with abdominal surgery with colostomy, and chemotherapy now with complex cystic mass in central pelvis with mass effect on veins bilaerally.  she recently has an aspiration of 550 cc of fluid from mass that did not improve lymphedema in legs.  She has been elevating them with little benefit.     Patient Stated Goals  to get help with decreasing her swelling. " I want to get rid of all this oozing"     Currently in Pain?  No/denies         [facility] PT Assessment - 01/01/18 0001      Assessment   Medical Diagnosis  colon cancer     Referring Provider  Dr. Benay Spice       Precautions   Precautions  None      Restrictions   Weight Bearing Restrictions  No      Balance Screen   Has the patient fallen in the past 6 months  No    Has the patient had a decrease in activity level because of a fear of falling?   No swelling has really slowed her down    Is the patient reluctant to leave their home because of a fear of falling?   No      Home Environment   Living Environment  Private residence    Living Arrangements  Spouse/significant other    Available Help at Discharge  Available 24 hours/day    Type  of [place] to enter    Entrance Stairs-Number of Steps  3    Entrance Stairs-Rails  Can reach both      Prior Function   Level of Independence  Needs assistance with ADLs;Other (comment)      Cognition   Overall Cognitive Status  Within Functional Limits for tasks assessed      Observation/Other Assessments   Observations  Pt with visible edema in both legs , Left > right with copious weeking of clear fluid from lower left leg.  Her sock and shoe are soaked from the drainage and the skin on her foot is macerated, but no open wound are visible  Skin Integrity  red map like area on lower leg with weeping and maceration on left leg,  redness and firnmess on right lower leg. Firnessness behind left knee and up into thigh    Other Surveys   -- Lymphedema Life Impact Scale 35.29      Sensation   Light Touch  Not tested      Coordination   Gross Motor Movements are Fluid and Coordinated  No limited by heaviness in her legs       Posture/Postural Control   Posture/Postural Control  Postural limitations    Postural Limitations  Rounded Shoulders;Forward head      ROM / Strength   AROM / PROM / Strength  Strength      Strength   Overall Strength Comments   pt struggles to move due to the weight of her legs, especially her left leg     Right/Left Hip  Right;Left    Right Hip Flexion  3/5    Left Hip Flexion  2/5    Right/Left Knee  Right;Left    Right Knee Extension  3/5    Left Knee Extension  2/5    Right/Left Ankle  Right;Left    Right Ankle Dorsiflexion  3/5    Left Ankle Dorsiflexion  3/5        LYMPHEDEMA/ONCOLOGY QUESTIONNAIRE - 01/01/18 1050      Right Lower Extremity Lymphedema   10 cm Proximal to Suprapatella  50 cm    At Midpatella/Popliteal Crease  48 cm    30 cm Proximal to Floor at Lateral Plantar Foot  41 cm    20 cm Proximal to Floor at Lateral Plantar Foot  37 1    10 cm Proximal to Floor at Lateral Malleoli  27.4 cm    5 cm  Proximal to 1st MTP Joint  24.5 cm    Across MTP Joint  22.5 cm    Around Proximal Great Toe  8.4 cm      Left Lower Extremity Lymphedema   10 cm Proximal to Suprapatella  52.5 cm    At Midpatella/Popliteal Crease  48 cm    30 cm Proximal to Floor at Lateral Plantar Foot  41.5 cm    20 cm Proximal to Floor at Lateral Plantar Foot  35 cm    10 cm Proximal to Floor at Lateral Malleoli  31 cm    5 cm Proximal to 1st MTP Joint  24 cm    Across MTP Joint  25.5 cm    Around Proximal Great Toe  8.2 cm             Outpatient Re[facility]01/[facility]  Lymphedema Life Impact Scale Total Score  35.29 %      Objective measurements completed on examination: See above findings.      [place] Adult PT Treatment/Exercise - 01/01/18 0001      Ambulation/Gait   Ambulation/Gait  Yes    Ambulation/Gait Assistance  4: Min assist    Ambulation Distance (Feet)  50 Feet    Gait Comments  pt state she uses a straight cane at home       Self-Care   Self-Care  Other Self-Care Comments    Other Self-Care Comments   contacted Dr. Gearldine Shown office for presciption for CircAid reduction kit for both lower legs.  2 Allevyn dressings applied to left anterior and large tg soft applied to both legs to provide some compression.  Pt  and husband instructed to get newborn baby diapers or incontinence pads to place under circaid reduction kits to absorb the drainage              PT Education - 01/01/18 1323    Education provided  Yes    Education Details  use of pads to absorb fluid and where to go to get circaid reduction kits     Person(s) Educated  Patient;Spouse    Methods  Explanation;Handout    Comprehension  Verbalized understanding       PT Short Term Goals - 01/01/18 1336      PT SHORT TERM GOAL #1   Title  Pt and husband have a way to manage lymphedema at home with MLD and compression     Time  4    Period  Weeks    Status  New      PT  SHORT TERM GOAL #2   Title  Pt will decrease left leg circumference to 28 cm at 10 cm proximal to lateral foot     Baseline  31 cm    Time  4    Period  Weeks    Status  New        PT Long Term Goals - 01/01/18 1337      PT LONG TERM GOAL #1   Title  Pt will report she is able to independently manage lymphedema at home    Time  8    Period  Weeks    Status  New      PT LONG TERM GOAL #2   Title  Pt willi increase her hip flexor strengthto 3+/5 so that she is  able to lift her leg so that she can get in and out of the car by herself     Time  8    Period  Weeks    Status  New      PT LONG TERM GOAL #3   Title  Pt will be able to walk > 100 feet independently without device so that she can manage better at home     Baseline  50 feet with min assist     Time  8    Period  Weeks    Status  New             Plan - 01/01/18 1328    Clinical Impression Statement  Pt presents with lymphedema in both legs with map like redness on both lower legs and copious weeping of clear fluid from the left leg She has more lymphedema in her left leg that extends up to her thigh. Immediate need is to control the fluid so she was given and Allevyn to try with tg soft and will get a Velcro adjustable garment and use baby diapers or incontinene pads for now .  We will need to progress cautiously with edema management as there is venous compression caussing it. Pt and husband know to use light compression with the velcro.     History and Personal Factors relevant to plan of care:  ongoing treatment for colon cancer, limited mobility due to weight of legs.     Clinical Presentation  Evolving    Clinical Presentation due to:  ongoing chemo, uncontolled lymphedema with weeping and skin maceration     Clinical Decision Making  Moderate    Clinical Impairments Affecting Rehab Potential  Pelvic mass causing venous constriction     PT Frequency  2x /  week    PT Duration  8 weeks    PT  Treatment/Interventions  ADLs/Self Care Home Management;DME Instruction;Therapeutic exercise;Therapeutic activities;Patient/family education;Manual lymph drainage;Compression bandaging;Manual techniques    PT Next Visit Plan  reassess especaily to see how reduction kits are working and circumference of left thigh, determine need for reduction kit to thigh, MLD and exercise.  Later set functional mobility goals      Consulted and Agree with Plan of Care  Patient       Patient will benefit from skilled therapeutic intervention in order to improve the following deficits and impairments:  Decreased knowledge of use of DME, Decreased skin integrity, Postural dysfunction, Increased edema, Decreased knowledge of precautions, Decreased strength  Visit Diagnosis: Lymphedema, not elsewhere classified - Plan: PT plan of care cert/re-cert  Abnormal posture - Plan: PT plan of care cert/re-cert  Muscle weakness (generalized) - Plan: PT plan of care cert/re-cert     Problem List Patient Active Problem List   Diagnosis Date Noted  . Goals of care, counseling/discussion 12/30/2017  . Palliative care encounter   . Neoplasm related pain   . Tachycardia 10/27/2017  . Urinary retention 10/27/2017  . Lower urinary tract infectious disease 10/27/2017  . Chemotherapy-induced neutropenia ([place]) 10/27/2017  . UTI (urinary tract infection) 10/27/2017  . Left femoral vein DVT ([place]) 10/27/2017  . Sepsis ([place]) 10/27/2017  . Acute pyelonephritis 10/27/2017  . Chronic pain 10/23/2017  . Acute pulmonary embolism ([place]) 10/23/2017  . Bilateral pulmonary embolism ([place]) 10/22/2017  . Port-A-Cath in place 07/27/2017  . Port catheter in place 04/14/2016  . Iron deficiency anemia 04/14/2016  . Rectal cancer ([place]) 03/20/2016  . Leukocytosis   . Cellulitis 03/10/2016  . Malnutrition of moderate degree 03/07/2016  . Metastatic colorectal cancer ([place]) 03/03/2016  . Bowel obstruction ([place]) 02/29/2016  . Mass of colon  02/29/2016  . Nausea & vomiting 02/29/2016  . Lumbar radiculopathy 01/26/2014  . Essential thrombocytosis ([place]) 11/10/2011   Donato Heinz. Owens Shark PT  Norwood Levo 01/01/2018, 1:46 PM  [place], [place], [address] Phone: [phone]   Fax:  [phone]  Name: Carolyn Barton MRN: [id number] Date of Birth: 12-24-1955

## 2018-01-01 NOTE — Telephone Encounter (Signed)
Message from Maudry Diego, PT requesting order for bilateral Circaid reduction kits to be sent to "[facility] Reviewed with Dr. Benay Spice. Order received and faxed.

## 2018-01-03 ENCOUNTER — Other Ambulatory Visit: Payer: Self-pay | Admitting: Oncology

## 2018-01-04 ENCOUNTER — Inpatient Hospital Stay: Payer: BLUE CROSS/BLUE SHIELD

## 2018-01-04 ENCOUNTER — Encounter: Payer: Self-pay | Admitting: General Practice

## 2018-01-04 ENCOUNTER — Inpatient Hospital Stay (HOSPITAL_BASED_OUTPATIENT_CLINIC_OR_DEPARTMENT_OTHER): Payer: BLUE CROSS/BLUE SHIELD | Admitting: Oncology

## 2018-01-04 VITALS — BP 104/66 | HR 93 | Temp 98.4°F | Resp 17 | Ht 65.0 in | Wt 147.6 lb

## 2018-01-04 DIAGNOSIS — R600 Localized edema: Secondary | ICD-10-CM | POA: Diagnosis not present

## 2018-01-04 DIAGNOSIS — Z7901 Long term (current) use of anticoagulants: Secondary | ICD-10-CM | POA: Diagnosis not present

## 2018-01-04 DIAGNOSIS — C20 Malignant neoplasm of rectum: Secondary | ICD-10-CM | POA: Diagnosis not present

## 2018-01-04 DIAGNOSIS — I82412 Acute embolism and thrombosis of left femoral vein: Secondary | ICD-10-CM | POA: Diagnosis not present

## 2018-01-04 DIAGNOSIS — D509 Iron deficiency anemia, unspecified: Secondary | ICD-10-CM

## 2018-01-04 DIAGNOSIS — I2699 Other pulmonary embolism without acute cor pulmonale: Secondary | ICD-10-CM | POA: Diagnosis not present

## 2018-01-04 DIAGNOSIS — C9000 Multiple myeloma not having achieved remission: Secondary | ICD-10-CM

## 2018-01-04 DIAGNOSIS — C787 Secondary malignant neoplasm of liver and intrahepatic bile duct: Secondary | ICD-10-CM

## 2018-01-04 DIAGNOSIS — C786 Secondary malignant neoplasm of retroperitoneum and peritoneum: Secondary | ICD-10-CM

## 2018-01-04 DIAGNOSIS — C19 Malignant neoplasm of rectosigmoid junction: Secondary | ICD-10-CM

## 2018-01-04 LAB — CMP (CANCER CENTER ONLY)
ALBUMIN: 1.8 g/dL — AB (ref 3.5–5.0)
ALT: 10 U/L (ref 0–55)
ANION GAP: 5 (ref 3–11)
AST: 13 U/L (ref 5–34)
Alkaline Phosphatase: 70 U/L (ref 40–150)
BUN: 11 mg/dL (ref 7–26)
CHLORIDE: 103 mmol/L (ref 98–109)
CO2: 26 mmol/L (ref 22–29)
Calcium: 8.2 mg/dL — ABNORMAL LOW (ref 8.4–10.4)
Creatinine: 0.58 mg/dL — ABNORMAL LOW (ref 0.60–1.10)
GFR, Est AFR Am: >60 mL/min (ref >60)
GFR, Estimated: >60 mL/min (ref >60)
GLUCOSE: 100 mg/dL (ref 70–140)
Potassium: 3.9 mmol/L (ref 3.5–5.1)
SODIUM: 134 mmol/L — AB (ref 136–145)
TOTAL PROTEIN: 4.7 g/dL — AB (ref 6.4–8.3)
Total Bilirubin: <0.2 mg/dL — ABNORMAL LOW (ref 0.2–1.2)

## 2018-01-04 LAB — TOTAL PROTEIN, URINE DIPSTICK: Protein, ur: 300 mg/dL — AB

## 2018-01-04 LAB — CBC WITH DIFFERENTIAL (CANCER CENTER ONLY)
BASOS ABS: 0.1 10*3/uL (ref 0.0–0.1)
Basophils Relative: 1 %
Eosinophils Absolute: 0.3 10*3/uL (ref 0.0–0.5)
Eosinophils Relative: 7 %
HCT: 25.8 % — ABNORMAL LOW (ref 34.8–46.6)
Hemoglobin: 8.2 g/dL — ABNORMAL LOW (ref 11.6–15.9)
LYMPHS ABS: 0.8 10*3/uL — AB (ref 0.9–3.3)
LYMPHS PCT: 17 %
MCH: 25.3 pg (ref 25.1–34.0)
MCHC: 31.8 g/dL (ref 31.5–36.0)
MCV: 79.6 fL (ref 79.5–101.0)
MONO ABS: 0.6 10*3/uL (ref 0.1–0.9)
Monocytes Relative: 13 %
NEUTROS ABS: 2.9 10*3/uL (ref 1.5–6.5)
Neutrophils Relative %: 62 %
Platelet Count: 735 10*3/uL — ABNORMAL HIGH (ref 145–400)
RBC: 3.24 MIL/uL — ABNORMAL LOW (ref 3.70–5.45)
RDW: 18.8 % — AB (ref 11.2–14.5)
WBC Count: 4.6 10*3/uL (ref 3.9–10.3)

## 2018-01-04 LAB — URINALYSIS, COMPLETE (UACMP) WITH MICROSCOPIC
BILIRUBIN URINE: NEGATIVE
Ketones, ur: NEGATIVE mg/dL
Nitrite: POSITIVE — AB
PH: 6 (ref 5.0–8.0)
Protein, ur: 100 mg/dL — AB
Specific Gravity, Urine: 1.014 (ref 1.005–1.030)
Squamous Epithelial / LPF: NONE SEEN

## 2018-01-04 LAB — MAGNESIUM: Magnesium: 1.6 mg/dL (ref 1.5–2.5)

## 2018-01-04 LAB — CEA (IN HOUSE-CHCC): CEA (CHCC-In House): 2.29 ng/mL (ref 0.00–5.00)

## 2018-01-04 MED ORDER — FLUOROURACIL CHEMO INJECTION 2.5 GM/50ML
400.0000 mg/m2 | Freq: Once | INTRAVENOUS | Status: AC
  Administered 2018-01-04: 700 mg via INTRAVENOUS
  Filled 2018-01-04: qty 14

## 2018-01-04 MED ORDER — PALONOSETRON HCL INJECTION 0.25 MG/5ML
INTRAVENOUS | Status: AC
  Filled 2018-01-04: qty 5

## 2018-01-04 MED ORDER — OXALIPLATIN CHEMO INJECTION 100 MG/20ML
85.0000 mg/m2 | Freq: Once | INTRAVENOUS | Status: AC
  Administered 2018-01-04: 150 mg via INTRAVENOUS
  Filled 2018-01-04: qty 20

## 2018-01-04 MED ORDER — DEXAMETHASONE SODIUM PHOSPHATE 10 MG/ML IJ SOLN
10.0000 mg | Freq: Once | INTRAMUSCULAR | Status: AC
  Administered 2018-01-04: 10 mg via INTRAVENOUS

## 2018-01-04 MED ORDER — DEXTROSE 5 % IV SOLN
Freq: Once | INTRAVENOUS | Status: AC
  Administered 2018-01-04: 13:00:00 via INTRAVENOUS

## 2018-01-04 MED ORDER — LEUCOVORIN CALCIUM INJECTION 350 MG
400.0000 mg/m2 | Freq: Once | INTRAVENOUS | Status: AC
  Administered 2018-01-04: 704 mg via INTRAVENOUS
  Filled 2018-01-04: qty 35.2

## 2018-01-04 MED ORDER — SODIUM CHLORIDE 0.9 % IV SOLN
5.0000 mg/kg | Freq: Once | INTRAVENOUS | Status: AC
  Administered 2018-01-04: 350 mg via INTRAVENOUS
  Filled 2018-01-04: qty 14

## 2018-01-04 MED ORDER — SODIUM CHLORIDE 0.9 % IV SOLN
2400.0000 mg/m2 | INTRAVENOUS | Status: DC
  Administered 2018-01-04: 4200 mg via INTRAVENOUS
  Filled 2018-01-04: qty 84

## 2018-01-04 MED ORDER — PALONOSETRON HCL INJECTION 0.25 MG/5ML
0.2500 mg | Freq: Once | INTRAVENOUS | Status: AC
  Administered 2018-01-04: 0.25 mg via INTRAVENOUS

## 2018-01-04 MED ORDER — DEXAMETHASONE SODIUM PHOSPHATE 10 MG/ML IJ SOLN
INTRAMUSCULAR | Status: AC
  Filled 2018-01-04: qty 1

## 2018-01-04 NOTE — Patient Instructions (Signed)
Implanted Port Home Guide An implanted port is a type of central line that is placed under the skin. Central lines are used to provide IV access when treatment or nutrition needs to be given through a person's veins. Implanted ports are used for long-term IV access. An implanted port may be placed because:  You need IV medicine that would be irritating to the small veins in your hands or arms.  You need long-term IV medicines, such as antibiotics.  You need IV nutrition for a long period.  You need frequent blood draws for lab tests.  You need dialysis.  Implanted ports are usually placed in the chest area, but they can also be placed in the upper arm, the abdomen, or the leg. An implanted port has two main parts:  Reservoir. The reservoir is round and will appear as a small, raised area under your skin. The reservoir is the part where a needle is inserted to give medicines or draw blood.  Catheter. The catheter is a thin, flexible tube that extends from the reservoir. The catheter is placed into a large vein. Medicine that is inserted into the reservoir goes into the catheter and then into the vein.  How will I care for my incision site? Do not get the incision site wet. Bathe or shower as directed by your health care provider. How is my port accessed? Special steps must be taken to access the port:  Before the port is accessed, a numbing cream can be placed on the skin. This helps numb the skin over the port site.  Your health care provider uses a sterile technique to access the port. ? Your health care provider must put on a mask and sterile gloves. ? The skin over your port is cleaned carefully with an antiseptic and allowed to dry. ? The port is gently pinched between sterile gloves, and a needle is inserted into the port.  Only "non-coring" port needles should be used to access the port. Once the port is accessed, a blood return should be checked. This helps ensure that the port  is in the vein and is not clogged.  If your port needs to remain accessed for a constant infusion, a clear (transparent) bandage will be placed over the needle site. The bandage and needle will need to be changed every week, or as directed by your health care provider.  Keep the bandage covering the needle clean and dry. Do not get it wet. Follow your health care provider's instructions on how to take a shower or bath while the port is accessed.  If your port does not need to stay accessed, no bandage is needed over the port.  What is flushing? Flushing helps keep the port from getting clogged. Follow your health care provider's instructions on how and when to flush the port. Ports are usually flushed with saline solution or a medicine called heparin. The need for flushing will depend on how the port is used.  If the port is used for intermittent medicines or blood draws, the port will need to be flushed: ? After medicines have been given. ? After blood has been drawn. ? As part of routine maintenance.  If a constant infusion is running, the port may not need to be flushed.  How long will my port stay implanted? The port can stay in for as long as your health care provider thinks it is needed. When it is time for the port to come out, surgery will be   done to remove it. The procedure is similar to the one performed when the port was put in. When should I seek immediate medical care? When you have an implanted port, you should seek immediate medical care if:  You notice a bad smell coming from the incision site.  You have swelling, redness, or drainage at the incision site.  You have more swelling or pain at the port site or the surrounding area.  You have a fever that is not controlled with medicine.  This information is not intended to replace advice given to you by your health care provider. Make sure you discuss any questions you have with your health care provider. Document  Released: 09/15/2005 Document Revised: 02/21/2016 Document Reviewed: 05/23/2013 Elsevier Interactive Patient Education  2017 [employer]  

## 2018-01-04 NOTE — Progress Notes (Signed)
[facility] Spiritual Care Note  Followed up with [name] in infusion today. She was in good spirits and states that, aside from leg edema and pain, she is overall feeling pretty good. [name] is an active, socially connected person, so limited mobility and the need for elevating her legs can detract from QOL. She reports good support from her husband Purcell Nails, brother [name] (who is a Clinical biochemist resident at [employer]), and her children.  [name] finds meaning and encouragement in periodic Spiritual Care check-ins. We plan to f/u when she is on campus, but please also page if needs arise or circumstances change. Thank you.   [place], [place], [facility] Pager [phone] Voicemail [phone]

## 2018-01-04 NOTE — Progress Notes (Signed)
Carolyn Card, NP has s/w Dr. Benay Spice & given ok for tx w/ Avastin today given upcoming cystoscopy 01/13/18. Kennith Center, Pharm.D., CPP 01/04/2018@11 :42 AM

## 2018-01-04 NOTE — Addendum Note (Signed)
Addended by: Sinda Du on: 01/04/2018 05:23 PM   Modules accepted: Orders

## 2018-01-04 NOTE — Patient Instructions (Addendum)
[facility] Discharge Instructions for Patients Receiving Chemotherapy  Today you received the following chemotherapy agents:  Avastin, Oxaliplatin, Leucovorin, and 5FU.  To help prevent nausea and vomiting after your treatment, we encourage you to take your nausea medication as directed.   If you develop nausea and vomiting that is not controlled by your nausea medication, call the clinic.   BELOW ARE SYMPTOMS THAT SHOULD BE REPORTED IMMEDIATELY:  *FEVER GREATER THAN 100.5 F  *CHILLS WITH OR WITHOUT FEVER  NAUSEA AND VOMITING THAT IS NOT CONTROLLED WITH YOUR NAUSEA MEDICATION  *UNUSUAL SHORTNESS OF BREATH  *UNUSUAL BRUISING OR BLEEDING  TENDERNESS IN MOUTH AND THROAT WITH OR WITHOUT PRESENCE OF ULCERS  *URINARY PROBLEMS  *BOWEL PROBLEMS  UNUSUAL RASH Items with * indicate a potential emergency and should be followed up as soon as possible.  Feel free to call the clinic should you have any questions or concerns. The clinic phone number is [phone].  Please show the [facility] at check-in to the Emergency Department and triage nurse.  Oxaliplatin Injection What is this medicine? OXALIPLATIN (ox AL i PLA tin) is a chemotherapy drug. It targets fast dividing cells, like cancer cells, and causes these cells to die. This medicine is used to treat cancers of the colon and rectum, and many other cancers. This medicine may be used for other purposes; ask your health care provider or pharmacist if you have questions. COMMON BRAND NAME(S): Eloxatin What should I tell my health care provider before I take this medicine? They need to know if you have any of these conditions: -kidney disease -an unusual or allergic reaction to oxaliplatin, other chemotherapy, other medicines, foods, dyes, or preservatives -pregnant or trying to get pregnant -breast-feeding How should I use this medicine? This drug is given as an infusion into a vein. It is administered in a  hospital or clinic by a specially trained health care professional. Talk to your pediatrician regarding the use of this medicine in children. Special care may be needed. Overdosage: If you think you have taken too much of this medicine contact a poison control center or emergency room at once. NOTE: This medicine is only for you. Do not share this medicine with others. What if I miss a dose? It is important not to miss a dose. Call your doctor or health care professional if you are unable to keep an appointment. What may interact with this medicine? -medicines to increase blood counts like filgrastim, pegfilgrastim, sargramostim -probenecid -some antibiotics like amikacin, gentamicin, neomycin, polymyxin B, streptomycin, tobramycin -zalcitabine Talk to your doctor or health care professional before taking any of these medicines: -acetaminophen -aspirin -ibuprofen -ketoprofen -naproxen This list may not describe all possible interactions. Give your health care provider a list of all the medicines, herbs, non-prescription drugs, or dietary supplements you use. Also tell them if you smoke, drink alcohol, or use illegal drugs. Some items may interact with your medicine. What should I watch for while using this medicine? Your condition will be monitored carefully while you are receiving this medicine. You will need important blood work done while you are taking this medicine. This medicine can make you more sensitive to cold. Do not drink cold drinks or use ice. Cover exposed skin before coming in contact with cold temperatures or cold objects. When out in cold weather wear warm clothing and cover your mouth and nose to warm the air that goes into your lungs. Tell your doctor if you get sensitive to  the cold. This drug may make you feel generally unwell. This is not uncommon, as chemotherapy can affect healthy cells as well as cancer cells. Report any side effects. Continue your course of treatment  even though you feel ill unless your doctor tells you to stop. In some cases, you may be given additional medicines to help with side effects. Follow all directions for their use. Call your doctor or health care professional for advice if you get a fever, chills or sore throat, or other symptoms of a cold or flu. Do not treat yourself. This drug decreases your body's ability to fight infections. Try to avoid being around people who are sick. This medicine may increase your risk to bruise or bleed. Call your doctor or health care professional if you notice any unusual bleeding. Be careful brushing and flossing your teeth or using a toothpick because you may get an infection or bleed more easily. If you have any dental work done, tell your dentist you are receiving this medicine. Avoid taking products that contain aspirin, acetaminophen, ibuprofen, naproxen, or ketoprofen unless instructed by your doctor. These medicines may hide a fever. Do not become pregnant while taking this medicine. Women should inform their doctor if they wish to become pregnant or think they might be pregnant. There is a potential for serious side effects to an unborn child. Talk to your health care professional or pharmacist for more information. Do not breast-feed an infant while taking this medicine. Call your doctor or health care professional if you get diarrhea. Do not treat yourself. What side effects may I notice from receiving this medicine? Side effects that you should report to your doctor or health care professional as soon as possible: -allergic reactions like skin rash, itching or hives, swelling of the face, lips, or tongue -low blood counts - This drug may decrease the number of white blood cells, red blood cells and platelets. You may be at increased risk for infections and bleeding. -signs of infection - fever or chills, cough, sore throat, pain or difficulty passing urine -signs of decreased platelets or  bleeding - bruising, pinpoint red spots on the skin, black, tarry stools, nosebleeds -signs of decreased red blood cells - unusually weak or tired, fainting spells, lightheadedness -breathing problems -chest pain, pressure -cough -diarrhea -jaw tightness -mouth sores -nausea and vomiting -pain, swelling, redness or irritation at the injection site -pain, tingling, numbness in the hands or feet -problems with balance, talking, walking -redness, blistering, peeling or loosening of the skin, including inside the mouth -trouble passing urine or change in the amount of urine Side effects that usually do not require medical attention (report to your doctor or health care professional if they continue or are bothersome): -changes in vision -constipation -hair loss -loss of appetite -metallic taste in the mouth or changes in taste -stomach pain This list may not describe all possible side effects. Call your doctor for medical advice about side effects. You may report side effects to FDA at 1-800-FDA-1088. Where should I keep my medicine? This drug is given in a hospital or clinic and will not be stored at home. NOTE: This sheet is a summary. It may not cover all possible information. If you have questions about this medicine, talk to your doctor, pharmacist, or health care provider.  2018 Elsevier/Gold Standard (2008-04-11 17:22:47)  Bevacizumab injection What is this medicine? BEVACIZUMAB (be va SIZ yoo mab) is a monoclonal antibody. It is used to treat many types of cancer. This medicine  may be used for other purposes; ask your health care provider or pharmacist if you have questions. COMMON BRAND NAME(S): Avastin What should I tell my health care provider before I take this medicine? They need to know if you have any of these conditions: -diabetes -heart disease -high blood pressure -history of coughing up blood -prior anthracycline chemotherapy (e.g., doxorubicin, daunorubicin,  epirubicin) -recent or ongoing radiation therapy -recent or planning to have surgery -stroke -an unusual or allergic reaction to bevacizumab, hamster proteins, mouse proteins, other medicines, foods, dyes, or preservatives -pregnant or trying to get pregnant -breast-feeding How should I use this medicine? This medicine is for infusion into a vein. It is given by a health care professional in a hospital or clinic setting. Talk to your pediatrician regarding the use of this medicine in children. Special care may be needed. Overdosage: If you think you have taken too much of this medicine contact a poison control center or emergency room at once. NOTE: This medicine is only for you. Do not share this medicine with others. What if I miss a dose? It is important not to miss your dose. Call your doctor or health care professional if you are unable to keep an appointment. What may interact with this medicine? Interactions are not expected. This list may not describe all possible interactions. Give your health care provider a list of all the medicines, herbs, non-prescription drugs, or dietary supplements you use. Also tell them if you smoke, drink alcohol, or use illegal drugs. Some items may interact with your medicine. What should I watch for while using this medicine? Your condition will be monitored carefully while you are receiving this medicine. You will need important blood work and urine testing done while you are taking this medicine. This medicine may increase your risk to bruise or bleed. Call your doctor or health care professional if you notice any unusual bleeding. This medicine should be started at least 28 days following major surgery and the site of the surgery should be totally healed. Check with your doctor before scheduling dental work or surgery while you are receiving this treatment. Talk to your doctor if you have recently had surgery or if you have a wound that has not  healed. Do not become pregnant while taking this medicine or for 6 months after stopping it. Women should inform their doctor if they wish to become pregnant or think they might be pregnant. There is a potential for serious side effects to an unborn child. Talk to your health care professional or pharmacist for more information. Do not breast-feed an infant while taking this medicine and for 6 months after the last dose. This medicine has caused ovarian failure in some women. This medicine may interfere with the ability to have a child. You should talk to your doctor or health care professional if you are concerned about your fertility. What side effects may I notice from receiving this medicine? Side effects that you should report to your doctor or health care professional as soon as possible: -allergic reactions like skin rash, itching or hives, swelling of the face, lips, or tongue -chest pain or chest tightness -chills -coughing up blood -high fever -seizures -severe constipation -signs and symptoms of bleeding such as bloody or black, tarry stools; red or dark-brown urine; spitting up blood or brown material that looks like coffee grounds; red spots on the skin; unusual bruising or bleeding from the eye, gums, or nose -signs and symptoms of a blood clot such  as breathing problems; chest pain; severe, sudden headache; pain, swelling, warmth in the leg -signs and symptoms of a stroke like changes in vision; confusion; trouble speaking or understanding; severe headaches; sudden numbness or weakness of the face, arm or leg; trouble walking; dizziness; loss of balance or coordination -stomach pain -sweating -swelling of legs or ankles -vomiting -weight gain Side effects that usually do not require medical attention (report to your doctor or health care professional if they continue or are bothersome): -back pain -changes in taste -decreased appetite -dry skin -nausea -tiredness This list  may not describe all possible side effects. Call your doctor for medical advice about side effects. You may report side effects to FDA at 1-800-FDA-1088. Where should I keep my medicine? This drug is given in a hospital or clinic and will not be stored at home. NOTE: This sheet is a summary. It may not cover all possible information. If you have questions about this medicine, talk to your doctor, pharmacist, or health care provider.  2018 Elsevier/Gold Standard (2016-09-12 14:33:29)

## 2018-01-04 NOTE — Progress Notes (Signed)
Per Wylene Simmer RN, Dr. Benay Spice aware of current urine protein and advised ok to proceed with bevacizumab today.

## 2018-01-04 NOTE — Progress Notes (Signed)
[facility] OFFICE PROGRESS NOTE   Diagnosis: Colon cancer  INTERVAL HISTORY:   Ms. Kage returns as scheduled.  She saw physical therapy last week.  She reports partial improvement in leg edema with a lymphedema wrap.  She is in the process of buying an electric chair.  She is scheduled for follow-up and physical therapy later this week.  Objective:  Vital signs in last 24 hours:  There were no vitals taken for this visit.    HEENT: No thrush or ulcers Resp: Lungs clear bilaterally Cardio: Regular rate and rhythm GI: No hepatomegaly, left lower quadrant colostomy, nondistended Vascular: Tense edema throughout the left greater than right leg   Portacath/PICC-without erythema  Lab Results:  Lab Results  Component Value Date   WBC 4.3 12/23/2017   HGB 9.3 (L) 12/23/2017   HCT 29.2 (L) 12/23/2017   MCV 81.8 12/23/2017   PLT 525 (H) 12/23/2017   NEUTROABS 5.3 12/21/2017    CMP     Component Value Date/Time   NA 130 (L) 12/21/2017 0816   NA 139 09/21/2017 0746   K 3.3 (L) 12/21/2017 0816   K 3.3 (L) 09/21/2017 0746   CL 98 12/21/2017 0816   CL 101 12/01/2012 0924   CO2 24 12/21/2017 0816   CO2 28 09/21/2017 0746   GLUCOSE 116 12/21/2017 0816   GLUCOSE 92 09/21/2017 0746   GLUCOSE 94 12/01/2012 0924   BUN 16 12/21/2017 0816   BUN 9.3 09/21/2017 0746   CREATININE 0.63 12/21/2017 0816   CREATININE 0.6 09/21/2017 0746   CALCIUM 8.1 (L) 12/21/2017 0816   CALCIUM 8.7 09/21/2017 0746   PROT 4.6 (L) 12/21/2017 0816   PROT 6.2 (L) 09/21/2017 0746   ALBUMIN 1.9 (L) 12/21/2017 0816   ALBUMIN 3.2 (L) 09/21/2017 0746   AST 13 12/21/2017 0816   AST 19 09/21/2017 0746   ALT 9 12/21/2017 0816   ALT 12 09/21/2017 0746   ALKPHOS 61 12/21/2017 0816   ALKPHOS 105 09/21/2017 0746   BILITOT 0.3 12/21/2017 0816   BILITOT 0.41 09/21/2017 0746   GFRNONAA >60 12/21/2017 0816   GFRAA >60 12/21/2017 0816    Lab Results  Component Value Date   CEA1 2.01  12/07/2017     Medications: I have reviewed the patient's current medications.   Assessment/Plan: 1.Metastatic colorectal cancer-status post an exploratory laparotomy 03/03/2016 revealing a proximal rectal mass, peritoneal carcinomatosis, and liver metastases  Biopsy of peritoneal nodules 03/03/2016 confirmed metastatic adenocarcinoma consistent with a colon primary  Foundation 1 testing-MSI-stable, tumor mutation burden-low, no BRAF NRAS or KRAS mutation  Cycle 1 FOLFIRI/PANITUMUMAB 04/14/2016  Cycle 2 FOLFIRI/panitumumab 04/28/2016  Cycle 3 FOLFIRI/panitumumab 05/12/2016  Cycle 4 FOLFIRI/panitumumab 05/27/2016  Cycle 5 FOLFIRI/panitumumab 06/09/2016  Restaging CT abdomen/pelvis 06/20/2016-no evidence of disease progression, decreased left hepatic lesion  Cycle 6 FOLFIRI/panitumumab 06/23/2016  Cycle 7 FOLFIRI/panitumumab 07/07/2016  Cycle 8 FOLFIRI/panitumumab 07/21/2016  Cycle 9 FOLFIRI/PANITUMUMAB 08/04/2016  Cycle 10 FOLFIRI/panitumumab 08/18/2016  Restaging CT 08/29/2016 with interval decrease in the size of the medial segment left liver lesion. Lesion identified previously in the rectosigmoid colon not evident on the current study.  5-FU/PANITUMUMAB 09/01/2016  Xeloda 7 days on/7 days off and PANITUMUMAB every 3 weeks 09/15/2016 (awaiting insurance approval for Xeloda 09/15/2016)  CT abdomen/pelvis 12/25/2016-unchanged 5 mm right hepatic lesion, resolution of medial segment left liver lesion, no new liver lesion. Residual soft tissue fullness in the sigmoid , persistent distal esophageal wall thickening  Continuationof PANITUMUMAB every 3 weeks and Xeloda  7 days on/7 days off  CT 06/19/2017-new cystic/solid lesion in the left adnexa  Xeloda/panitumumab continued  CT 10/08/2016-progression of cystic pelvic mass  Cycle 1 FOLFIRI/Panitumumab 10/21/2017  Cystic pelvic mass aspirated 10/30/2017  Cycle 2 FOLFIRI/panitumumab 11/09/2017  Cycle 3  FOLFIRI/panitumumab 11/23/2017  Cycle 4 FOLFIRI/Panitumumab 12/07/2017  Cycle 5 FOLFIRI/panitumumab 12/21/2017  CT pelvis 12/23/2017- complex cystic pelvic mass with mass-effect on the pelvic vasculature  CT aspiration of cystic pelvic mass 12/23/2017  Cycle 1 FOLFOX/Avastin 01/04/2018  2.History of aBowel obstruction secondary to #1  3. Chronic back pain-maintained on methadone  4. Essential thrombocytosis  5. Early obstruction of the left ureter noted at the time of surgery 03/03/2016-Dr. Tannenbaum placed a left double-J stent 03/18/2016  New onset right hydronephrosis 10/11/2017  Right ureter stent placement 10/13/2017  6. Abdominal wall cellulitis 03/10/2016, blood cultures positive for coagulase negative staphylococcus-methicillin-resistant, status post treatment with vancomycin and Bactrim   8. Port-A-Cath placement 04/10/2016, interventional radiology  9. Iron deficiency anemia. Feraheme 11/05/2015 , 11/12/2015,and 04/14/2016-persistent anemia and red cell microcytosis, oral iron resumed 11/17/2016-stable  10. Pain/tenderness left posterior iliac region-resolved  11. Hypokalemia-started on potassium replacement 06/09/2016  12. History of skin rashand paronychiasecondary to [facility], she continues minocycline, moisturizers, and fluticasone as needed  13.Acute onset right low back pain 10/11/2017-likely secondary to obstruction of the right kidney;left ureter stent exchange and right ureter stent placement 10/13/2017  14.Left leg pain and edema-bilateral lower extremity venous Doppler negative for DVT1/15/2019 and 10/19/2017. Left common femoral DVT noted on CT venogram 10/27/2017;CT aspiration of pelvic cystic mass 10/30/2017 with subsequent marked improvement of leg edema.  15. Admission 10/22/2017 with bilateral pulmonary embolism, heparin drip initiated, converted to Lovenox 10/23/2017, converted to Xarelto during the 10/27/2017 hospital  admission  16. Admission 10/27/2017 with increased back/leg pain and a fever-blood and urine cultures positive fora resistant E. coli    Disposition: Ms. Gronau appears unchanged.  She has persistent lower extremity edema, likely secondary to obstructing tumor in the pelvis.  The plan is to begin salvage therapy with FOLFOX/Avastin today.  We reviewed potential toxicities associated with this regimen.  She agrees to proceed.  She will continue follow-up in the physical therapy clinic for management of the bilateral leg edema.  Ms. Parmar will return for an office visit and cycle 2 FOLFOX/Avastin in 2 weeks.  [name] Coder, MD  01/04/2018  7:22 AM

## 2018-01-05 ENCOUNTER — Other Ambulatory Visit: Payer: Self-pay

## 2018-01-05 ENCOUNTER — Encounter (HOSPITAL_BASED_OUTPATIENT_CLINIC_OR_DEPARTMENT_OTHER): Payer: Self-pay

## 2018-01-05 NOTE — Addendum Note (Signed)
Addended by: Sinda Du on: 01/05/2018 03:38 PM   Modules accepted: Orders

## 2018-01-05 NOTE — Progress Notes (Signed)
Spoke with: Grayland Ormond NPO:  After Midnight, no gum, candy, or mints   Arrival time: 0630AM Labs: None  AM medications: Methadone, Morphine if needed, Omeprazole, Ondanestron or Compazine if needed Pre op orders: Yes Ride home:  Purcell Nails (husband) [phone]

## 2018-01-06 ENCOUNTER — Inpatient Hospital Stay: Payer: BLUE CROSS/BLUE SHIELD

## 2018-01-06 ENCOUNTER — Ambulatory Visit: Payer: BLUE CROSS/BLUE SHIELD

## 2018-01-06 VITALS — BP 129/77 | HR 98 | Temp 98.2°F | Resp 18

## 2018-01-06 DIAGNOSIS — C20 Malignant neoplasm of rectum: Secondary | ICD-10-CM | POA: Diagnosis not present

## 2018-01-06 DIAGNOSIS — I890 Lymphedema, not elsewhere classified: Secondary | ICD-10-CM

## 2018-01-06 MED ORDER — HEPARIN SOD (PORK) LOCK FLUSH 100 UNIT/ML IV SOLN
500.0000 [IU] | Freq: Once | INTRAVENOUS | Status: AC | PRN
  Administered 2018-01-06: 500 [IU]
  Filled 2018-01-06: qty 5

## 2018-01-06 MED ORDER — SODIUM CHLORIDE 0.9% FLUSH
10.0000 mL | INTRAVENOUS | Status: DC | PRN
  Administered 2018-01-06: 10 mL
  Filled 2018-01-06: qty 10

## 2018-01-06 NOTE — Therapy (Signed)
[place], [place], [address] Phone: [phone]   Fax:  [phone]  Physical Therapy Treatment  Patient Details  Name: Carolyn Barton MRN: [id number] Date of Birth: November 13, 1955 Referring Provider: Dr. Benay Spice    Encounter Date: 01/06/2018  PT End of Session - 01/06/18 1216    Visit Number  2    Number of Visits  17    Date for PT Re-Evaluation  03/03/18    PT Start Time  1104    PT Stop Time  1200    PT Time Calculation (min)  56 min    Activity Tolerance  Patient tolerated treatment well    Behavior During Therapy  [facility] for tasks assessed/performed       Past Medical History:  Diagnosis Date  . Abdominal wall cellulitis 03/10/2016  . Anxiety   . Ascites 10/27/2017   Small volume noted on CT  . Aspiration of liquid 12/23/2017   pelvic cyst/mass  . Bowel obstruction ([place])   . Chronic back pain    lumbar    . Colostomy in place [facility])   . Depression   . DVT (deep venous thrombosis) ([place]) 10/27/2017   Non occlusive left common femoral vein  . Dyspnea   . 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 GERD (gastroesophageal reflux disease)   . Grade I diastolic dysfunction 48/88/9169   ECHO   . History of chemotherapy last chemo 09-21-17  . History of hiatal hernia   . History of pelvic mass 11/2017   cyst  . History of pulmonary embolism 09/2017   Bilateral  . Hydronephrosis    right  . Hypokalemia    takes potassium  . Iron deficiency anemia 04/14/2016   treated w/ Iron infusions  . Liver metastasis ([place])    secondary to colorectal cancer  . Lower leg edema   . Metastatic colorectal cancer [facility]) dx 03/03/2016--- oncologist-- dr Benay Spice   metastatic colorectal carcinoma--  s/p  exp. lap. for bowel obstruction--  rectal mass, peritoneal carcinomatosis, liver mets---  chemotherapy  . Scoliosis   . Skin rash    on legs seeing wound care specialist  . Spinal headache  12/21/2012  . Tachycardia    persistant since discharged from hospital 06/ 2017 due to anemia and deconditioning;  as of 09-02-2016 per pt no issues w/ heart racing in the past few weeks  . Ureteral obstruction, left   . Wears contact lenses     Past Surgical History:  Procedure Laterality Date  . BIOPSY N/A 03/03/2016   Procedure: BIOPSY OF PERITONEAL NODULE;  Surgeon: Erroll Luna, MD;  Location: [place];  Service: General;  Laterality: N/A;  . COLOSTOMY N/A 03/03/2016   Procedure: DIVERTING SIGMOID COLOSTOMY;  Surgeon: Erroll Luna, MD;  Location: [place];  Service: General;  Laterality: N/A;  . CYSTOSCOPY W/ RETROGRADES Right 10/13/2017   Procedure: CYSTOSCOPY WITH RETROGRADE PYELOGRAM/ STENT PLACEMENT;  Surgeon: Ceasar Mons, MD;  Location: [facility];  Service: Urology;  Laterality: Right;  ONLY NEEDS 30 MIN FOR BOTH PROCEDURES  . CYSTOSCOPY W/ URETERAL STENT PLACEMENT Left 10/09/2016   Procedure: CYSTOSCOPY WITH LEFT STENT REPLACEMENT 21F POLARIS STENT 24CM;  Surgeon: Carolan Clines, MD;  Location: [facility];  Service: Urology;  Laterality: Left;  . CYSTOSCOPY W/ URETERAL STENT PLACEMENT Left 03/16/2017   Procedure: CYSTOSCOPY WITH RETROGRADE PYELOGRAM WITH LEFT  STENT REMOVAL AND REPLACEMENT;  Surgeon: Carolan Clines, MD;  Location: [place];  Service: Urology;  Laterality: Left;  . CYSTOSCOPY W/ URETERAL STENT PLACEMENT Left 10/13/2017   Procedure: CYSTOSCOPY WITH STENT REPLACEMENT;  Surgeon: Ceasar Mons, MD;  Location: [facility];  Service: Urology;  Laterality: Left;  . CYSTOSCOPY WITH FULGERATION N/A 10/13/2017   Procedure: CYSTOSCOPY WITH FULGERATION;  Surgeon: Ceasar Mons, MD;  Location: [facility];  Service: Urology;  Laterality: N/A;  . CYSTOSCOPY WITH RETROGRADE PYELOGRAM, URETEROSCOPY AND STENT PLACEMENT Left 03/18/2016   Procedure: CYSTOSCOPY WITH LEFT   RETROGRADE PYELOGRAM,  AND  POLARIS STENT PLACEMENT;  Surgeon: Carolan Clines, MD;  [facility];  Service: Urology;  Laterality: Left;  . LAPAROTOMY N/A 03/03/2016   Procedure: EXPLORATORY LAPAROTOMY;  Surgeon: Erroll Luna, MD;  Location: [place];  Service: General;  Laterality: N/A;  . LUMBAR LAMINECTOMY  11/11/2012   L4-5  and Resection synovial cyst  . MAXIMUM ACCESS (MAS)POSTERIOR LUMBAR INTERBODY FUSION (PLIF) 1 LEVEL N/A 01/11/2014   Procedure: FOR MAXIMUM ACCESS (MAS) POSTERIOR LUMBAR INTERBODY FUSION Lumbar Five Sacral One;  Surgeon: [name], MD;  Location: [place] [facility];  Service: Neurosurgery;  Laterality: N/A;  FOR MAXIMUM ACCESS (MAS) POSTERIOR LUMBAR INTERBODY FUSION Lumbar Five Sacral One  . PORTACATH PLACEMENT  04/10/2016  . REPAIR CEREBROSPINAL FLUID LEAK POST RESECTION SYNOVIAL CYST  12/21/2012  . REVISION LUMBAR HARDWARE  01/26/2014  . TRANSTHORACIC ECHOCARDIOGRAM  06/17/2016   grade 1 diastolic function , ef 40-97%/  trivial TR  . WISDOM TOOTH EXTRACTION      There were no vitals filed for this visit.  Subjective Assessment - 01/06/18 1107    Subjective  I've been wearing the velcro compression garments about every other day and my husband has been good about making me elevate my legs as much as able. The drainage is improved when I'm able to elevate my legs.      Pertinent History  Colon cancer diagnosed in June 2017 with abdominal surgery with colostomy, and chemotherapy now with complex cystic mass in central pelvis with mass effect on veins bilaerally.  she recently has an aspiration of 550 cc of fluid from mass that did not improve lymphedema in legs.  She has been elevating them with little benefit.     Patient Stated Goals  to get help with decreasing her swelling. " I want to get rid of all this oozing"     Currently in Pain?  No/denies                  [facility]patient [facility]  Lymphedema  Life Impact Scale Total Score  35.29 %           [facility] Adult PT Treatment/Exercise - 01/06/18 0001      Manual Therapy   Manual Therapy  Manual Lymphatic Drainage (MLD);Edema management    Edema Management  Pt forgot new velcro garments but did assess her legs. Her legs are much imprved per pt from last session. Only has 2 open areas with clear drainage. So after manual lymph drainage applied non adhesive bandage here kept in place by TG soft.     Manual Lymphatic Drainage (MLD)  in Supine with HOB elevated: Short neck, 5 diaphragmatic breaths, Lt axilla nodes and brief modified upper quadrant series as able avoiding colostomy bag, Lt inguino-axillary anastomosis, then Lt uper leg and briefly to anterior medial ower leg avoiding 2 open areas.  PT Short Term Goals - 01/01/18 1336      PT SHORT TERM GOAL #1   Title  Pt and husband have a way to manage lymphedema at home with MLD and compression     Time  4    Period  Weeks    Status  New      PT SHORT TERM GOAL #2   Title  Pt will decrease left leg circumference to 28 cm at 10 cm proximal to lateral foot     Baseline  31 cm    Time  4    Period  Weeks    Status  New        PT Long Term Goals - 01/01/18 1337      PT LONG TERM GOAL #1   Title  Pt will report she is able to independently manage lymphedema at home    Time  8    Period  Weeks    Status  New      PT LONG TERM GOAL #2   Title  Pt willi increase her hip flexor strengthto 3+/5 so that she is  able to lift her leg so that she can get in and out of the car by herself     Time  8    Period  Weeks    Status  New      PT LONG TERM GOAL #3   Title  Pt will be able to walk > 100 feet independently without device so that she can manage better at home     Baseline  50 feet with min assist     Time  8    Period  Weeks    Status  New            Plan - 01/06/18 1218    Clinical Impression Statement  Began instruction of manual lymph  drainage (MLD) today with basic instruction of anatomy of lymph system and principles of MLD. Though Lt anterior leg still very sensitive to touch at open areas she tolerated MLD very well and reported feeling some relief in her Lt leg after session. Issued new TG soft knee high for bil LE's as both of hers were saturated from drainage.     Clinical Impairments Affecting Rehab Potential  Pelvic mass causing venous constriction     PT Frequency  2x / week    PT Duration  8 weeks    PT Treatment/Interventions  ADLs/Self Care Home Management;DME Instruction;Therapeutic exercise;Therapeutic activities;Patient/family education;Manual lymph drainage;Compression bandaging;Manual techniques    PT Next Visit Plan  Instruct husband in MLD; reassess especially to see how reduction kits are working and circumference of left thigh, determine need for reduction kit to thigh, MLD and exercise.  Later set functional mobility goals      Consulted and Agree with Plan of Care  Patient       Patient will benefit from skilled therapeutic intervention in order to improve the following deficits and impairments:  Decreased knowledge of use of DME, Decreased skin integrity, Postural dysfunction, Increased edema, Decreased knowledge of precautions, Decreased strength  Visit Diagnosis: Lymphedema, not elsewhere classified     Problem List Patient Active Problem List   Diagnosis Date Noted  . Goals of care, counseling/discussion 12/30/2017  . Palliative care encounter   . Neoplasm related pain   . Tachycardia 10/27/2017  . Urinary retention 10/27/2017  . Lower urinary tract infectious disease 10/27/2017  . Chemotherapy-induced neutropenia ([place]) 10/27/2017  .  UTI (urinary tract infection) 10/27/2017  . Left femoral vein DVT ([place]) 10/27/2017  . Sepsis ([place]) 10/27/2017  . Acute pyelonephritis 10/27/2017  . Chronic pain 10/23/2017  . Acute pulmonary embolism ([place]) 10/23/2017  . Bilateral pulmonary embolism ([place])  10/22/2017  . Port-A-Cath in place 07/27/2017  . Port catheter in place 04/14/2016  . Iron deficiency anemia 04/14/2016  . Rectal cancer ([place]) 03/20/2016  . Leukocytosis   . Cellulitis 03/10/2016  . Malnutrition of moderate degree 03/07/2016  . Metastatic colorectal cancer ([place]) 03/03/2016  . Bowel obstruction ([place]) 02/29/2016  . Mass of colon 02/29/2016  . Nausea & vomiting 02/29/2016  . Lumbar radiculopathy 01/26/2014  . Essential thrombocytosis ([place]) 11/10/2011    Otelia Limes, PTA 01/06/2018, 12:27 PM  [place], [place], [address] Phone: [phone]   Fax:  [phone]  Name: ELIANY MCCARTER MRN: [id number] Date of Birth: 02/19/1956

## 2018-01-06 NOTE — Patient Instructions (Signed)
Implanted Port Home Guide An implanted port is a type of central line that is placed under the skin. Central lines are used to provide IV access when treatment or nutrition needs to be given through a person's veins. Implanted ports are used for long-term IV access. An implanted port may be placed because:  You need IV medicine that would be irritating to the small veins in your hands or arms.  You need long-term IV medicines, such as antibiotics.  You need IV nutrition for a long period.  You need frequent blood draws for lab tests.  You need dialysis.  Implanted ports are usually placed in the chest area, but they can also be placed in the upper arm, the abdomen, or the leg. An implanted port has two main parts:  Reservoir. The reservoir is round and will appear as a small, raised area under your skin. The reservoir is the part where a needle is inserted to give medicines or draw blood.  Catheter. The catheter is a thin, flexible tube that extends from the reservoir. The catheter is placed into a large vein. Medicine that is inserted into the reservoir goes into the catheter and then into the vein.  How will I care for my incision site? Do not get the incision site wet. Bathe or shower as directed by your health care provider. How is my port accessed? Special steps must be taken to access the port:  Before the port is accessed, a numbing cream can be placed on the skin. This helps numb the skin over the port site.  Your health care provider uses a sterile technique to access the port. ? Your health care provider must put on a mask and sterile gloves. ? The skin over your port is cleaned carefully with an antiseptic and allowed to dry. ? The port is gently pinched between sterile gloves, and a needle is inserted into the port.  Only "non-coring" port needles should be used to access the port. Once the port is accessed, a blood return should be checked. This helps ensure that the port  is in the vein and is not clogged.  If your port needs to remain accessed for a constant infusion, a clear (transparent) bandage will be placed over the needle site. The bandage and needle will need to be changed every week, or as directed by your health care provider.  Keep the bandage covering the needle clean and dry. Do not get it wet. Follow your health care provider's instructions on how to take a shower or bath while the port is accessed.  If your port does not need to stay accessed, no bandage is needed over the port.  What is flushing? Flushing helps keep the port from getting clogged. Follow your health care provider's instructions on how and when to flush the port. Ports are usually flushed with saline solution or a medicine called heparin. The need for flushing will depend on how the port is used.  If the port is used for intermittent medicines or blood draws, the port will need to be flushed: ? After medicines have been given. ? After blood has been drawn. ? As part of routine maintenance.  If a constant infusion is running, the port may not need to be flushed.  How long will my port stay implanted? The port can stay in for as long as your health care provider thinks it is needed. When it is time for the port to come out, surgery will be   done to remove it. The procedure is similar to the one performed when the port was put in. When should I seek immediate medical care? When you have an implanted port, you should seek immediate medical care if:  You notice a bad smell coming from the incision site.  You have swelling, redness, or drainage at the incision site.  You have more swelling or pain at the port site or the surrounding area.  You have a fever that is not controlled with medicine.  This information is not intended to replace advice given to you by your health care provider. Make sure you discuss any questions you have with your health care provider. Document  Released: 09/15/2005 Document Revised: 02/21/2016 Document Reviewed: 05/23/2013 Elsevier Interactive Patient Education  2017 [employer]  

## 2018-01-08 ENCOUNTER — Telehealth: Payer: Self-pay

## 2018-01-08 DIAGNOSIS — C20 Malignant neoplasm of rectum: Secondary | ICD-10-CM

## 2018-01-08 LAB — URINE CULTURE: Culture: 60000 — AB

## 2018-01-08 MED ORDER — AMOXICILLIN-POT CLAVULANATE 500-125 MG PO TABS: 500.0000 mg | ORAL_TABLET | Freq: Two times a day (BID) | ORAL | 0 refills | Status: AC

## 2018-01-08 NOTE — Telephone Encounter (Addendum)
Pt voiced understanding of message below. Medication sent to pharmacy   Pt questioned when to stop taking blood thinner before stent procedure. Per Dr. Benay Spice, pt to take last dose Sunday before procedure Wednesday.   ----- Message from Ladell Pier, MD sent at 01/07/2018  8:03 AM EDT ----- Please call patient, urine culture growing e.coli, prescribe augmentin 500mg  bid for 5 days

## 2018-01-11 ENCOUNTER — Ambulatory Visit: Payer: BLUE CROSS/BLUE SHIELD | Admitting: Physical Therapy

## 2018-01-11 DIAGNOSIS — I890 Lymphedema, not elsewhere classified: Secondary | ICD-10-CM | POA: Diagnosis not present

## 2018-01-11 NOTE — Therapy (Signed)
[place], [place], [address] Phone: [phone]   Fax:  [phone]  Physical Therapy Treatment  Patient Details  Name: Carolyn Barton MRN: [id number] Date of Birth: 10/03/1955 Referring Provider: Dr. Benay Spice    Encounter Date: 01/11/2018  PT End of Session - 01/11/18 1705    Visit Number  3    Number of Visits  17    Date for PT Re-Evaluation  03/03/18    PT Start Time  1602    PT Stop Time  1656    PT Time Calculation (min)  54 min    Activity Tolerance  Patient tolerated treatment well she does have some tender spots at mid-left calf    Behavior During Therapy  [facility] for tasks assessed/performed       Past Medical History:  Diagnosis Date  . Abdominal wall cellulitis 03/10/2016  . Anxiety   . Ascites 10/27/2017   Small volume noted on CT  . Aspiration of liquid 12/23/2017   pelvic cyst/mass  . Bowel obstruction ([place])   . Chronic back pain    lumbar    . Colostomy in place [facility])   . Depression   . DVT (deep venous thrombosis) ([place]) 10/27/2017   Non occlusive left common femoral vein  . Dyspnea   . 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 GERD (gastroesophageal reflux disease)   . Grade I diastolic dysfunction 14/97/0263   ECHO   . History of chemotherapy last chemo 09-21-17  . History of hiatal hernia   . History of pelvic mass 11/2017   cyst  . History of pulmonary embolism 09/2017   Bilateral  . Hydronephrosis    right  . Hypokalemia    takes potassium  . Iron deficiency anemia 04/14/2016   treated w/ Iron infusions  . Liver metastasis ([place])    secondary to colorectal cancer  . Lower leg edema   . Metastatic colorectal cancer [facility]) dx 03/03/2016--- oncologist-- dr Benay Spice   metastatic colorectal carcinoma--  s/p  exp. lap. for bowel obstruction--  rectal mass, peritoneal carcinomatosis, liver mets---  chemotherapy  . Scoliosis   . Skin rash    on legs  seeing wound care specialist  . Spinal headache 12/21/2012  . Tachycardia    persistant since discharged from hospital 06/ 2017 due to anemia and deconditioning;  as of 09-02-2016 per pt no issues w/ heart racing in the past few weeks  . Ureteral obstruction, left   . Wears contact lenses     Past Surgical History:  Procedure Laterality Date  . BIOPSY N/A 03/03/2016   Procedure: BIOPSY OF PERITONEAL NODULE;  Surgeon: Erroll Luna, MD;  Location: [place];  Service: General;  Laterality: N/A;  . COLOSTOMY N/A 03/03/2016   Procedure: DIVERTING SIGMOID COLOSTOMY;  Surgeon: Erroll Luna, MD;  Location: [place];  Service: General;  Laterality: N/A;  . CYSTOSCOPY W/ RETROGRADES Right 10/13/2017   Procedure: CYSTOSCOPY WITH RETROGRADE PYELOGRAM/ STENT PLACEMENT;  Surgeon: Ceasar Mons, MD;  Location: [facility];  Service: Urology;  Laterality: Right;  ONLY NEEDS 30 MIN FOR BOTH PROCEDURES  . CYSTOSCOPY W/ URETERAL STENT PLACEMENT Left 10/09/2016   Procedure: CYSTOSCOPY WITH LEFT STENT REPLACEMENT 92F POLARIS STENT 24CM;  Surgeon: Carolan Clines, MD;  Location: [facility];  Service: Urology;  Laterality: Left;  . CYSTOSCOPY W/ URETERAL STENT PLACEMENT Left 03/16/2017   Procedure: CYSTOSCOPY WITH RETROGRADE PYELOGRAM WITH LEFT  STENT REMOVAL AND REPLACEMENT;  Surgeon: Carolan Clines, MD;  [facility];  Service: Urology;  Laterality: Left;  . CYSTOSCOPY W/ URETERAL STENT PLACEMENT Left 10/13/2017   Procedure: CYSTOSCOPY WITH STENT REPLACEMENT;  Surgeon: Ceasar Mons, MD;  Location: [facility];  Service: Urology;  Laterality: Left;  . CYSTOSCOPY WITH FULGERATION N/A 10/13/2017   Procedure: CYSTOSCOPY WITH FULGERATION;  Surgeon: Ceasar Mons, MD;  Location: [facility];  Service: Urology;  Laterality: N/A;  . CYSTOSCOPY WITH RETROGRADE PYELOGRAM, URETEROSCOPY AND STENT PLACEMENT  Left 03/18/2016   Procedure: CYSTOSCOPY WITH LEFT  RETROGRADE PYELOGRAM,  AND  POLARIS STENT PLACEMENT;  Surgeon: Carolan Clines, MD;  [facility];  Service: Urology;  Laterality: Left;  . LAPAROTOMY N/A 03/03/2016   Procedure: EXPLORATORY LAPAROTOMY;  Surgeon: Erroll Luna, MD;  Location: [place];  Service: General;  Laterality: N/A;  . LUMBAR LAMINECTOMY  11/11/2012   L4-5  and Resection synovial cyst  . MAXIMUM ACCESS (MAS)POSTERIOR LUMBAR INTERBODY FUSION (PLIF) 1 LEVEL N/A 01/11/2014   Procedure: FOR MAXIMUM ACCESS (MAS) POSTERIOR LUMBAR INTERBODY FUSION Lumbar Five Sacral One;  Surgeon: [name], MD;  Location: [place] [facility];  Service: Neurosurgery;  Laterality: N/A;  FOR MAXIMUM ACCESS (MAS) POSTERIOR LUMBAR INTERBODY FUSION Lumbar Five Sacral One  . PORTACATH PLACEMENT  04/10/2016  . REPAIR CEREBROSPINAL FLUID LEAK POST RESECTION SYNOVIAL CYST  12/21/2012  . REVISION LUMBAR HARDWARE  01/26/2014  . TRANSTHORACIC ECHOCARDIOGRAM  06/17/2016   grade 1 diastolic function , ef 73-53%/  trivial TR  . WISDOM TOOTH EXTRACTION      There were no vitals filed for this visit.  Subjective Assessment - 01/11/18 1609    Subjective  "I got a UTI and they put me on an antibiotic."  "If I've kept my feet propped up and then I put them down to walk,they just kill me.  I've started to put them down for a little while before I walk and that feels better." I'm working on getting a lift chair.      Pertinent History  Colon cancer diagnosed in June 2017 with abdominal surgery with colostomy, and chemotherapy now with complex cystic mass in central pelvis with mass effect on veins bilaerally.  she recently has an aspiration of 550 cc of fluid from mass that did not improve lymphedema in legs.  She has been elevating them with little benefit.     Patient Stated Goals  to get help with decreasing her swelling. " I want to get rid of all this oozing"     Currently in Pain?  No/denies         [facility] PT  Assessment - 01/11/18 0001      Observation/Other Assessments   Skin Integrity  left lower leg is macerated and oozing clear fluid along most of anterior aspect today              [facility]patient [facility]  Lymphedema Life Impact Scale Total Score  35.29 %           [facility] Adult PT Treatment/Exercise - 01/11/18 0001      Manual Therapy   Manual Therapy  Compression Bandaging    Edema Management  Removed medium-weight stockinette from both legs.  Left stockinette was quite wet, especially at distal leg, from wounds oozing. Washed legs with soap and water, gently debriding of dead skin. Lotion applied to both lower legs and feet, except  wound area on left anterior leg.    Manual Lymphatic Drainage (MLD)  In supine with wedge to elevate trunk and head: 5 diaphragmatic breaths, short neck, left axilla and inguino-axillary anastomosis, left thigh and knee; then lower leg, at medial aspect with leg in er and at posterior aspect with knee bent and foot flat on mat; then left ankle; after that, retraced steps up to left axilla.    Compression Bandaging  Laid an ABD pad across left anterior calf, and it did cover all oozing areas, then applied size medium TG soft to left left from foot to mid-thigh.  Put foot in two surgical booties because patient's shoe had gotten wet from oozing of wounds.               PT Short Term Goals - 01/01/18 1336      PT SHORT TERM GOAL #1   Title  Pt and husband have a way to manage lymphedema at home with MLD and compression     Time  4    Period  Weeks    Status  New      PT SHORT TERM GOAL #2   Title  Pt will decrease left leg circumference to 28 cm at 10 cm proximal to lateral foot     Baseline  31 cm    Time  4    Period  Weeks    Status  New        PT Long Term Goals - 01/01/18 1337      PT LONG TERM GOAL #1   Title  Pt will report she is able to independently manage lymphedema  at home    Time  8    Period  Weeks    Status  New      PT LONG TERM GOAL #2   Title  Pt willi increase her hip flexor strengthto 3+/5 so that she is  able to lift her leg so that she can get in and out of the car by herself     Time  8    Period  Weeks    Status  New      PT LONG TERM GOAL #3   Title  Pt will be able to walk > 100 feet independently without device so that she can manage better at home     Baseline  50 feet with min assist     Time  8    Period  Weeks    Status  New            Plan - 01/11/18 1706    Clinical Impression Statement  Left leg looked worse today, according to patient, in terms of oozing wounds. She said she has used the velcro compression, but not regularly.  I educated her about the fact that compression should help decrease oozing. I asked her to wear or bring the velcro wraps in next time so that we can get a look at how they fit, and she agreed. Needed to use an ABD pad on anterior left lower leg today because most of the length of that was weeping clear fluid.    Clinical Impairments Affecting Rehab Potential  Pelvic mass causing venous constriction     PT Frequency  2x / week    PT Duration  8 weeks    PT Treatment/Interventions  ADLs/Self Care Home Management;DME Instruction;Therapeutic exercise;Therapeutic activities;Patient/family education;Manual lymph drainage;Compression bandaging;Manual techniques    PT Next Visit Plan  Instruct husband in MLD; reassess especially to see how reduction kits are working and circumference of left thigh, determine need for reduction kit to thigh, MLD and exercise.  Later set functional mobility goals      Consulted and Agree with Plan of Care  Patient       Patient will benefit from skilled therapeutic intervention in order to improve the following deficits and impairments:  Decreased knowledge of use of DME, Decreased skin integrity, Postural dysfunction, Increased edema, Decreased knowledge of precautions,  Decreased strength  Visit Diagnosis: Lymphedema, not elsewhere classified     Problem List Patient Active Problem List   Diagnosis Date Noted  . Goals of care, counseling/discussion 12/30/2017  . Palliative care encounter   . Neoplasm related pain   . Tachycardia 10/27/2017  . Urinary retention 10/27/2017  . Lower urinary tract infectious disease 10/27/2017  . Chemotherapy-induced neutropenia ([place]) 10/27/2017  . UTI (urinary tract infection) 10/27/2017  . Left femoral vein DVT ([place]) 10/27/2017  . Sepsis ([place]) 10/27/2017  . Acute pyelonephritis 10/27/2017  . Chronic pain 10/23/2017  . Acute pulmonary embolism ([place]) 10/23/2017  . Bilateral pulmonary embolism ([place]) 10/22/2017  . Port-A-Cath in place 07/27/2017  . Port catheter in place 04/14/2016  . Iron deficiency anemia 04/14/2016  . Rectal cancer ([place]) 03/20/2016  . Leukocytosis   . Cellulitis 03/10/2016  . Malnutrition of moderate degree 03/07/2016  . Metastatic colorectal cancer ([place]) 03/03/2016  . Bowel obstruction ([place]) 02/29/2016  . Mass of colon 02/29/2016  . Nausea & vomiting 02/29/2016  . Lumbar radiculopathy 01/26/2014  . Essential thrombocytosis ([place]) 11/10/2011    Mamye Bolds 01/11/2018, 5:09 PM  [place], [place], [address] Phone: [phone]   Fax:  [phone]  Name: Carolyn Barton MRN: [id number] Date of Birth: 09/12/1956  Serafina Royals, PT 01/11/18 5:09 PM

## 2018-01-12 ENCOUNTER — Telehealth: Payer: Self-pay

## 2018-01-12 NOTE — H&P (Signed)
Urology Preoperative H&P   Chief Complaint: Ureteral obstruction  History of Present Illness: Carolyn Barton is a 62 y.o. female with a history of metastatic colon cancer causing extrinsic compression of both ureters. She is status post cystoscopy with bilateral JJ stent placement in January 2019. Postoperatively, she had issues with urinary retention that required an indwelling Foley catheter along with leakage around the catheter. She has recently started on oxybutynin 5 mg twice a day with little improvement in her leakage around the catheter. She was noted to have a very small bladder capacity 2/2 extrinsic compression from her pelvic mass.    Past Medical History:  Diagnosis Date  . Abdominal wall cellulitis 03/10/2016  . Anxiety   . Ascites 10/27/2017   Small volume noted on CT  . Aspiration of liquid 12/23/2017   pelvic cyst/mass  . Bowel obstruction ([place])   . Chronic back pain    lumbar    . Colostomy in place [facility])   . Depression   . DVT (deep venous thrombosis) ([place]) 10/27/2017   Non occlusive left common femoral vein  . Dyspnea   . 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 GERD (gastroesophageal reflux disease)   . Grade I diastolic dysfunction 54/00/8676   ECHO   . History of chemotherapy last chemo 09-21-17  . History of hiatal hernia   . History of pelvic mass 11/2017   cyst  . History of pulmonary embolism 09/2017   Bilateral  . Hydronephrosis    right  . Hypokalemia    takes potassium  . Iron deficiency anemia 04/14/2016   treated w/ Iron infusions  . Liver metastasis ([place])    secondary to colorectal cancer  . Lower leg edema   . Metastatic colorectal cancer [facility]) dx 03/03/2016--- oncologist-- dr Benay Spice   metastatic colorectal carcinoma--  s/p  exp. lap. for bowel obstruction--  rectal mass, peritoneal carcinomatosis, liver mets---  chemotherapy  . Scoliosis   . Skin rash    on legs seeing wound care specialist  . Spinal headache  12/21/2012  . Tachycardia    persistant since discharged from hospital 06/ 2017 due to anemia and deconditioning;  as of 09-02-2016 per pt no issues w/ heart racing in the past few weeks  . Ureteral obstruction, left   . Wears contact lenses     Past Surgical History:  Procedure Laterality Date  . BIOPSY N/A 03/03/2016   Procedure: BIOPSY OF PERITONEAL NODULE;  Surgeon: Erroll Luna, MD;  Location: [address];  Service: General;  Laterality: N/A;  . COLOSTOMY N/A 03/03/2016   Procedure: DIVERTING SIGMOID COLOSTOMY;  Surgeon: Erroll Luna, MD;  Location: [place];  Service: General;  Laterality: N/A;  . CYSTOSCOPY W/ RETROGRADES Right 10/13/2017   Procedure: CYSTOSCOPY WITH RETROGRADE PYELOGRAM/ STENT PLACEMENT;  Surgeon: Ceasar Mons, MD;  Location: [facility];  Service: Urology;  Laterality: Right;  ONLY NEEDS 30 MIN FOR BOTH PROCEDURES  . CYSTOSCOPY W/ URETERAL STENT PLACEMENT Left 10/09/2016   Procedure: CYSTOSCOPY WITH LEFT STENT REPLACEMENT 38F POLARIS STENT 24CM;  Surgeon: Carolan Clines, MD;  Location: [facility];  Service: Urology;  Laterality: Left;  . CYSTOSCOPY W/ URETERAL STENT PLACEMENT Left 03/16/2017   Procedure: CYSTOSCOPY WITH RETROGRADE PYELOGRAM WITH LEFT  STENT REMOVAL AND REPLACEMENT;  Surgeon: Carolan Clines, MD;  Location: [facility];  Service: Urology;  Laterality: Left;  . CYSTOSCOPY W/ URETERAL STENT PLACEMENT Left 10/13/2017   Procedure: CYSTOSCOPY WITH  STENT REPLACEMENT;  Surgeon: Ceasar Mons, MD;  Location: [facility];  Service: Urology;  Laterality: Left;  . CYSTOSCOPY WITH FULGERATION N/A 10/13/2017   Procedure: CYSTOSCOPY WITH FULGERATION;  Surgeon: Ceasar Mons, MD;  Location: [facility];  Service: Urology;  Laterality: N/A;  . CYSTOSCOPY WITH RETROGRADE PYELOGRAM, URETEROSCOPY AND STENT PLACEMENT Left 03/18/2016   Procedure: CYSTOSCOPY WITH LEFT   RETROGRADE PYELOGRAM,  AND  POLARIS STENT PLACEMENT;  Surgeon: Carolan Clines, MD;  [facility];  Service: Urology;  Laterality: Left;  . LAPAROTOMY N/A 03/03/2016   Procedure: EXPLORATORY LAPAROTOMY;  Surgeon: Erroll Luna, MD;  Location: [place];  Service: General;  Laterality: N/A;  . LUMBAR LAMINECTOMY  11/11/2012   L4-5  and Resection synovial cyst  . MAXIMUM ACCESS (MAS)POSTERIOR LUMBAR INTERBODY FUSION (PLIF) 1 LEVEL N/A 01/11/2014   Procedure: FOR MAXIMUM ACCESS (MAS) POSTERIOR LUMBAR INTERBODY FUSION Lumbar Five Sacral One;  Surgeon: [name], MD;  Location: [place] [facility];  Service: Neurosurgery;  Laterality: N/A;  FOR MAXIMUM ACCESS (MAS) POSTERIOR LUMBAR INTERBODY FUSION Lumbar Five Sacral One  . PORTACATH PLACEMENT  04/10/2016  . REPAIR CEREBROSPINAL FLUID LEAK POST RESECTION SYNOVIAL CYST  12/21/2012  . REVISION LUMBAR HARDWARE  01/26/2014  . TRANSTHORACIC ECHOCARDIOGRAM  06/17/2016   grade 1 diastolic function , ef 22-29%/  trivial TR  . WISDOM TOOTH EXTRACTION      Allergies: No Known Allergies  Family History  Problem Relation Age of Onset  . Heart disease Mother   . Melanoma Mother   . Colon cancer Neg Hx   . Colon polyps Neg Hx   . Rectal cancer Neg Hx   . Stomach cancer Neg Hx   . Esophageal cancer Neg Hx     Social History:  reports that she quit smoking about 39 years ago. Her smoking use included cigarettes. She has a 0.25 pack-year smoking history. She has never used smokeless tobacco. She reports that she does not drink alcohol or use drugs.  ROS: A complete review of systems was performed.  All systems are negative except for pertinent findings as noted.  Physical Exam:  Vital signs in last 24 hours:   Constitutional:  Alert and oriented, No acute distress Cardiovascular: Regular rate and rhythm, No JVD Respiratory: Normal respiratory effort, Lungs clear bilaterally GI: Abdomen is soft, nontender, nondistended, no abdominal masses GU: No CVA  tenderness Lymphatic: No lymphadenopathy Neurologic: Grossly intact, no focal deficits Psychiatric: Normal mood and affect  Laboratory Data:  No results for input(s): WBC, HGB, HCT, PLT in the last 72 hours.  No results for input(s): NA, K, CL, GLUCOSE, BUN, CALCIUM, CREATININE in the last 72 hours.  Invalid input(s): CO3   No results found for this or any previous visit (from the past 24 hour(s)). Recent Results (from the past 240 hour(s))  Urine culture     Status: Abnormal   Collection Time: 01/04/18 10:43 AM  Result Value Ref Range Status   Specimen Description   Final    URINE, CLEAN CATCH Performed at [facility], [address], [place], [place] [address]    Special Requests   Final    NONE Performed at [facility], [place] [address], [place], [place] [address]    Culture (A)  Final    60,000 COLONIES/mL ESCHERICHIA COLI >=100,000 COLONIES/mL ESCHERICHIA COLI Susceptibility patterns are not the same. Performed at [facility], [place] [address], [place], [place]  [address]    Report Status 01/08/2018 FINAL  Final   Organism ID, Bacteria ESCHERICHIA COLI (A)  Final   Organism ID, Bacteria ESCHERICHIA COLI (A)  Final      Susceptibility   Escherichia coli - MIC*    AMPICILLIN >=32 RESISTANT Resistant     CEFAZOLIN <=4 SENSITIVE Sensitive     CEFTRIAXONE <=1 SENSITIVE Sensitive     CIPROFLOXACIN >=4 RESISTANT Resistant     GENTAMICIN <=1 SENSITIVE Sensitive     IMIPENEM <=0.25 SENSITIVE Sensitive     NITROFURANTOIN <=16 SENSITIVE Sensitive     TRIMETH/SULFA >=320 RESISTANT Resistant     AMPICILLIN/SULBACTAM 4 SENSITIVE Sensitive     PIP/TAZO <=4 SENSITIVE Sensitive     Extended ESBL NEGATIVE Sensitive    Escherichia coli - MIC*    AMPICILLIN >=32 RESISTANT Resistant     CEFAZOLIN <=4 SENSITIVE Sensitive     CEFTRIAXONE <=1 SENSITIVE Sensitive     CIPROFLOXACIN >=4 RESISTANT Resistant     GENTAMICIN <=1  SENSITIVE Sensitive     IMIPENEM 0.5 SENSITIVE Sensitive     NITROFURANTOIN 32 SENSITIVE Sensitive     TRIMETH/SULFA >=320 RESISTANT Resistant     AMPICILLIN/SULBACTAM >=32 RESISTANT Resistant     PIP/TAZO 8 SENSITIVE Sensitive     Extended ESBL NEGATIVE Sensitive     * 60,000 COLONIES/mL ESCHERICHIA COLI    >=100,000 COLONIES/mL ESCHERICHIA COLI    Renal Function: No results for input(s): CREATININE in the last 168 hours. Estimated Creatinine Clearance: 66.5 mL/min (A) (by C-G formula based on SCr of 0.58 mg/dL (L)).  Radiologic Imaging: No results found.  I independently reviewed the above imaging studies.  Assessment and Plan Carolyn Barton is a 62 y.o. female with a history of stage IV colon cancer causing bilateral ureteral obstruction  -The risks, benefits and alternatives of cystoscopy with bilateral stent exchange was discussed with the patient.  Risks include, but are not limited to, bleeding, infection, stent related pain, stent migration, ureteral injury, ureteral stricture formation and the inherent risks with general anesthesia.  She voices understanding and wishes to proceed.   Ellison Hughs, MD 04/09/18, 1:07 PM [facility] Pager: [phone]

## 2018-01-12 NOTE — Telephone Encounter (Signed)
Pt called with concern about pain in upper L leg. Pt states "my leg pain was so severe it woke me up early this morning". Pt denies redness or increased swelling to site. States "PT may have massaged me to hard today". Pt also states that prescribed pain medications have helped to lessen the pain throughout the day. Pt also called to inform MD that she will be rescheduling her stent procedure to when she feels better. This RN voiced understanding, made MD aware. Per Dr. Benay Spice pt to continue prescribed pain medication and call clinic with any worsening symptoms. Pt voiced understanding.

## 2018-01-13 ENCOUNTER — Ambulatory Visit (HOSPITAL_BASED_OUTPATIENT_CLINIC_OR_DEPARTMENT_OTHER): Admission: RE | Admit: 2018-01-13 | Payer: BLUE CROSS/BLUE SHIELD | Source: Ambulatory Visit | Admitting: Urology

## 2018-01-13 HISTORY — DX: Personal history of pulmonary embolism: Z86.711

## 2018-01-13 HISTORY — DX: Other foreign object in respiratory tract, part unspecified causing other injury, initial encounter: T17.998A

## 2018-01-13 HISTORY — DX: Personal history of other diseases of the digestive system: Z87.19

## 2018-01-13 HISTORY — DX: Anxiety disorder, unspecified: F41.9

## 2018-01-13 HISTORY — DX: Rash and other nonspecific skin eruption: R21

## 2018-01-13 HISTORY — DX: Depression, unspecified: F32.A

## 2018-01-13 HISTORY — DX: Dyspnea, unspecified: R06.00

## 2018-01-13 HISTORY — DX: Major depressive disorder, single episode, unspecified: F32.9

## 2018-01-13 HISTORY — DX: Scoliosis, unspecified: M41.9

## 2018-01-13 HISTORY — DX: Gastro-esophageal reflux disease without esophagitis: K21.9

## 2018-01-13 SURGERY — CYSTOSCOPY, FLEXIBLE, WITH STENT REPLACEMENT
Anesthesia: General | Laterality: Bilateral

## 2018-01-14 ENCOUNTER — Encounter: Payer: Self-pay | Admitting: Physical Therapy

## 2018-01-14 ENCOUNTER — Inpatient Hospital Stay (HOSPITAL_BASED_OUTPATIENT_CLINIC_OR_DEPARTMENT_OTHER): Payer: BLUE CROSS/BLUE SHIELD | Admitting: Medical

## 2018-01-14 ENCOUNTER — Telehealth: Payer: Self-pay

## 2018-01-14 ENCOUNTER — Ambulatory Visit: Payer: BLUE CROSS/BLUE SHIELD | Admitting: Physical Therapy

## 2018-01-14 VITALS — BP 132/72 | HR 115 | Temp 98.2°F | Resp 18 | Ht 65.0 in | Wt 149.4 lb

## 2018-01-14 DIAGNOSIS — I890 Lymphedema, not elsewhere classified: Secondary | ICD-10-CM | POA: Diagnosis not present

## 2018-01-14 DIAGNOSIS — C20 Malignant neoplasm of rectum: Secondary | ICD-10-CM

## 2018-01-14 DIAGNOSIS — R600 Localized edema: Secondary | ICD-10-CM

## 2018-01-14 DIAGNOSIS — C786 Secondary malignant neoplasm of retroperitoneum and peritoneum: Secondary | ICD-10-CM

## 2018-01-14 DIAGNOSIS — L03116 Cellulitis of left lower limb: Secondary | ICD-10-CM

## 2018-01-14 DIAGNOSIS — C787 Secondary malignant neoplasm of liver and intrahepatic bile duct: Secondary | ICD-10-CM

## 2018-01-14 MED ORDER — CEPHALEXIN 500 MG PO CAPS: 500.0000 mg | ORAL_CAPSULE | Freq: Four times a day (QID) | ORAL | 0 refills | Status: DC

## 2018-01-14 NOTE — Patient Instructions (Signed)

## 2018-01-14 NOTE — Progress Notes (Signed)
Pt coming from PT today, therapist reports severe weeping of L leg and pain causing limit in therapy.  Pt reporting sharp pain in L leg starting Tuesday morning and increased swelling/weeping of L leg with chronic lymphadema.  Leg wrapped in bandages so UTA skin/swelling at this time.  PA Lucianne Lei will assess.  Pt taking morphine in addition to methadone for pain management.

## 2018-01-14 NOTE — Therapy (Signed)
[place], [place], [address] Phone: [phone]   Fax:  [phone]  Physical Therapy Treatment  Patient Details  Name: Carolyn Barton MRN: [id number] Date of Birth: 08-21-1956 Referring Provider: Dr. Benay Spice    Encounter Date: 01/14/2018  PT End of Session - 01/14/18 1424    Visit Number  4    Number of Visits  17    Date for PT Re-Evaluation  03/03/18    PT Start Time  1350    PT Stop Time  1422    PT Time Calculation (min)  32 min    Activity Tolerance  Patient tolerated treatment well    Behavior During Therapy  [facility] for tasks assessed/performed       Past Medical History:  Diagnosis Date  . Abdominal wall cellulitis 03/10/2016  . Anxiety   . Ascites 10/27/2017   Small volume noted on CT  . Aspiration of liquid 12/23/2017   pelvic cyst/mass  . Bowel obstruction ([place])   . Chronic back pain    lumbar    . Colostomy in place [facility])   . Depression   . DVT (deep venous thrombosis) ([place]) 10/27/2017   Non occlusive left common femoral vein  . Dyspnea   . 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 GERD (gastroesophageal reflux disease)   . Grade I diastolic dysfunction 43/32/9518   ECHO   . History of chemotherapy last chemo 09-21-17  . History of hiatal hernia   . History of pelvic mass 11/2017   cyst  . History of pulmonary embolism 09/2017   Bilateral  . Hydronephrosis    right  . Hypokalemia    takes potassium  . Iron deficiency anemia 04/14/2016   treated w/ Iron infusions  . Liver metastasis ([place])    secondary to colorectal cancer  . Lower leg edema   . Metastatic colorectal cancer [facility]) dx 03/03/2016--- oncologist-- dr Benay Spice   metastatic colorectal carcinoma--  s/p  exp. lap. for bowel obstruction--  rectal mass, peritoneal carcinomatosis, liver mets---  chemotherapy  . Scoliosis   . Skin rash    on legs seeing wound care specialist  . Spinal headache  12/21/2012  . Tachycardia    persistant since discharged from hospital 06/ 2017 due to anemia and deconditioning;  as of 09-02-2016 per pt no issues w/ heart racing in the past few weeks  . Ureteral obstruction, left   . Wears contact lenses     Past Surgical History:  Procedure Laterality Date  . BIOPSY N/A 03/03/2016   Procedure: BIOPSY OF PERITONEAL NODULE;  Surgeon: Erroll Luna, MD;  Location: [place];  Service: General;  Laterality: N/A;  . COLOSTOMY N/A 03/03/2016   Procedure: DIVERTING SIGMOID COLOSTOMY;  Surgeon: Erroll Luna, MD;  Location: [place];  Service: General;  Laterality: N/A;  . CYSTOSCOPY W/ RETROGRADES Right 10/13/2017   Procedure: CYSTOSCOPY WITH RETROGRADE PYELOGRAM/ STENT PLACEMENT;  Surgeon: Ceasar Mons, MD;  Location: [facility];  Service: Urology;  Laterality: Right;  ONLY NEEDS 30 MIN FOR BOTH PROCEDURES  . CYSTOSCOPY W/ URETERAL STENT PLACEMENT Left 10/09/2016   Procedure: CYSTOSCOPY WITH LEFT STENT REPLACEMENT 24F POLARIS STENT 24CM;  Surgeon: Carolan Clines, MD;  Location: [facility];  Service: Urology;  Laterality: Left;  . CYSTOSCOPY W/ URETERAL STENT PLACEMENT Left 03/16/2017   Procedure: CYSTOSCOPY WITH RETROGRADE PYELOGRAM WITH LEFT  STENT REMOVAL AND REPLACEMENT;  Surgeon: Carolan Clines, MD;  Location: [place];  Service: Urology;  Laterality: Left;  . CYSTOSCOPY W/ URETERAL STENT PLACEMENT Left 10/13/2017   Procedure: CYSTOSCOPY WITH STENT REPLACEMENT;  Surgeon: Ceasar Mons, MD;  [facility];  Service: Urology;  Laterality: Left;  . CYSTOSCOPY WITH FULGERATION N/A 10/13/2017   Procedure: CYSTOSCOPY WITH FULGERATION;  Surgeon: Ceasar Mons, MD;  Location: [facility];  Service: Urology;  Laterality: N/A;  . CYSTOSCOPY WITH RETROGRADE PYELOGRAM, URETEROSCOPY AND STENT PLACEMENT Left 03/18/2016   Procedure: CYSTOSCOPY WITH LEFT   RETROGRADE PYELOGRAM,  AND  POLARIS STENT PLACEMENT;  Surgeon: Carolan Clines, MD;  [facility];  Service: Urology;  Laterality: Left;  . LAPAROTOMY N/A 03/03/2016   Procedure: EXPLORATORY LAPAROTOMY;  Surgeon: Erroll Luna, MD;  Location: [place];  Service: General;  Laterality: N/A;  . LUMBAR LAMINECTOMY  11/11/2012   L4-5  and Resection synovial cyst  . MAXIMUM ACCESS (MAS)POSTERIOR LUMBAR INTERBODY FUSION (PLIF) 1 LEVEL N/A 01/11/2014   Procedure: FOR MAXIMUM ACCESS (MAS) POSTERIOR LUMBAR INTERBODY FUSION Lumbar Five Sacral One;  Surgeon: [name], MD;  Location: [place] [facility];  Service: Neurosurgery;  Laterality: N/A;  FOR MAXIMUM ACCESS (MAS) POSTERIOR LUMBAR INTERBODY FUSION Lumbar Five Sacral One  . PORTACATH PLACEMENT  04/10/2016  . REPAIR CEREBROSPINAL FLUID LEAK POST RESECTION SYNOVIAL CYST  12/21/2012  . REVISION LUMBAR HARDWARE  01/26/2014  . TRANSTHORACIC ECHOCARDIOGRAM  06/17/2016   grade 1 diastolic function , ef 46-50%/  trivial TR  . WISDOM TOOTH EXTRACTION      There were no vitals filed for this visit.  Subjective Assessment - 01/14/18 1353    Subjective  I was here Monday and I was supposed to have stents placed in my kidneys on Wednesday and I woke up with horrible pain in my upper left leg. I decided to postpone my surgery.   (Pended)     Pertinent History  Colon cancer diagnosed in June 2017 with abdominal surgery with colostomy, and chemotherapy now with complex cystic mass in central pelvis with mass effect on veins bilaerally.  she recently has an aspiration of 550 cc of fluid from mass that did not improve lymphedema in legs.  She has been elevating them with little benefit.   (Pended)     Patient Stated Goals  to get help with decreasing her swelling. " I want to get rid of all this oozing"   (Pended)     Currently in Pain?  Yes  (Pended)     Pain Score  5   (Pended)     Pain Location  Leg  (Pended)     Pain Orientation  Left;Upper  (Pended)                    Outpatient Rehab from 01/01/2018 in [place]  Lymphedema Life Impact Scale Total Score  35.29 %           [facility] Adult PT Treatment/Exercise - 01/14/18 0001      Manual Therapy   Edema Management  Pt arrived with extremely saturated TG soft on LLE. There was a saturated dish towel around her ankle and the TG soft on her leg was completely saturated as well as her shoe and sock.     Compression Bandaging  Laid an ABD pad across left anterior calf, and it did cover all oozing areas, used 2 rolls of artiflex from foot to knee, then applied size medium TG soft  to left left from foot to knee.  Put foot in two surgical booties because patient's shoe had gotten wet from oozing of wounds. Also removed right sock and applied 2 shoe covers to this area               PT Short Term Goals - 01/01/18 1336      PT SHORT TERM GOAL #1   Title  Pt and husband have a way to manage lymphedema at home with MLD and compression     Time  4    Period  Weeks    Status  New      PT SHORT TERM GOAL #2   Title  Pt will decrease left leg circumference to 28 cm at 10 cm proximal to lateral foot     Baseline  31 cm    Time  4    Period  Weeks    Status  New        PT Long Term Goals - 01/01/18 1337      PT LONG TERM GOAL #1   Title  Pt will report she is able to independently manage lymphedema at home    Time  8    Period  Weeks    Status  New      PT LONG TERM GOAL #2   Title  Pt willi increase her hip flexor strengthto 3+/5 so that she is  able to lift her leg so that she can get in and out of the car by herself     Time  8    Period  Weeks    Status  New      PT LONG TERM GOAL #3   Title  Pt will be able to walk > 100 feet independently without device so that she can manage better at home     Baseline  50 feet with min assist     Time  8    Period  Weeks    Status  New            Plan - 01/14/18 1425    Clinical  Impression Statement  Pt arrived to clinic with extremely saturated dressings on LLE. She reports she was supposed to have a stent placed in her kidney today but woke up Tuesday morning at 3 am with stabbing pain in her left upper leg and cancelled her surgery. She has not been to the doctor for this pain yet. Removed saturated dressings from LLE and pt was oozing constantly. Her shoes and socks were saturated. Called pt's oncologist and spoke with Dr. Gearldine Shown nurse. She educated pt to come to [facility] as soon as she left the office. Reapplied an ABC pad and 2 rolls of artiflex as well as thich stockinette from foot to knee. Covered both feet with 2 shoe covers since pt's shoes were saturated with wound drainage. Educated pt not to put on any compression garments until she finds out if she has a DVT and it has resolved enough to do so.     Clinical Impairments Affecting Rehab Potential  Pelvic mass causing venous constriction     PT Frequency  2x / week    PT Duration  8 weeks    PT Treatment/Interventions  ADLs/Self Care Home Management;DME Instruction;Therapeutic exercise;Therapeutic activities;Patient/family education;Manual lymph drainage;Compression bandaging;Manual techniques    PT Next Visit Plan  see if pt had a DVT in left upper thigh, Instruct husband in MLD; reassess especially  to see how reduction kits are working and circumference of left thigh, determine need for reduction kit to thigh, MLD and exercise.  Later set functional mobility goals      Consulted and Agree with Plan of Care  Patient       Patient will benefit from skilled therapeutic intervention in order to improve the following deficits and impairments:  Decreased knowledge of use of DME, Decreased skin integrity, Postural dysfunction, Increased edema, Decreased knowledge of precautions, Decreased strength  Visit Diagnosis: Lymphedema, not elsewhere classified     Problem List Patient Active Problem List    Diagnosis Date Noted  . Goals of care, counseling/discussion 12/30/2017  . Palliative care encounter   . Neoplasm related pain   . Tachycardia 10/27/2017  . Urinary retention 10/27/2017  . Lower urinary tract infectious disease 10/27/2017  . Chemotherapy-induced neutropenia ([place]) 10/27/2017  . UTI (urinary tract infection) 10/27/2017  . Left femoral vein DVT ([place]) 10/27/2017  . Sepsis ([place]) 10/27/2017  . Acute pyelonephritis 10/27/2017  . Chronic pain 10/23/2017  . Acute pulmonary embolism ([place]) 10/23/2017  . Bilateral pulmonary embolism ([place]) 10/22/2017  . Port-A-Cath in place 07/27/2017  . Port catheter in place 04/14/2016  . Iron deficiency anemia 04/14/2016  . Rectal cancer ([place]) 03/20/2016  . Leukocytosis   . Cellulitis 03/10/2016  . Malnutrition of moderate degree 03/07/2016  . Metastatic colorectal cancer ([place]) 03/03/2016  . Bowel obstruction ([place]) 02/29/2016  . Mass of colon 02/29/2016  . Nausea & vomiting 02/29/2016  . Lumbar radiculopathy 01/26/2014  . Essential thrombocytosis ([place]) 11/10/2011    Allyson Sabal [facility] 01/14/2018, 2:29 PM  [place], [place], [address] Phone: [phone]   Fax:  [phone]  Name: Carolyn Barton MRN: [id number] Date of Birth: 1955/11/26  Manus Gunning, PT 01/14/18 2:30 PM

## 2018-01-14 NOTE — Telephone Encounter (Signed)
Call from [place] with PT to report "increased swelling and weeping of leg". Spoke with Sandi Mealy, PA. Pt to be seen in office. Benjie Karvonen made pt aware.

## 2018-01-15 NOTE — Progress Notes (Signed)
[facility] Progress Note   Carolyn Barton [id number] 01-17-1956 62 y.o.  Carolyn Barton is managed by Dr. Dominica Severin B. Sherrill  Actively treated with chemotherapy: yes  Current Therapy: FOLFOX  Last Treated: 04 / 08 / 2019 (cycle 1, day 1)  Assessment: Plan:    Cellulitis of left lower extremity - Plan: cephALEXin (KEFLEX) 500 MG capsule   Cellulitis of the left lower extremity: The patient was seen and examined with Dr. Dominica Severin B. Sherrill.  She was placed on Keflex 500 mg p.o. 4 times daily x7 days.  Please see After Visit Summary for patient specific instructions.  Future Appointments  Date Time Provider [place]  01/18/2018  8:15 AM [facility] LAB 2 [facility] None  01/18/2018  8:30 AM [facility] FLUSH NURSE [facility] None  01/18/2018  9:00 AM Ladell Pier, MD [facility] None  01/18/2018  9:45 AM [facility] I25 DNS [facility] None  01/20/2018 11:00 AM Collie Siad A, PTA [facility] None  01/20/2018  2:00 PM [facility] FLUSH NURSE 2 [facility] None  01/25/2018 11:00 AM Suanne Marker, PTA [facility] None  01/27/2018 11:00 AM Suanne Marker, PTA [facility] None  02/01/2018  8:15 AM [facility] LAB 4 [facility] None  02/01/2018  8:30 AM [facility] FLUSH NURSE 2 [facility] None  02/01/2018  9:00 AM Ladell Pier, MD [facility] None  02/01/2018  9:45 AM [facility] B5 [facility] None  02/03/2018  2:00 PM [facility] INJ NURSE [facility] None    No orders of the defined types were placed in this encounter.      Subjective:   Patient ID:  Carolyn Barton is a 62 y.o. (DOB 04-03-1956) female.  Chief Complaint:  Chief Complaint  Patient presents with  . Lymphadema    HPI Carolyn Barton is a 62 year old female with a history of a metastatic colorectal cancer who is status post an exploratory laparotomy in June 2017 which showed a proximal rectal mass, peritoneal carcinomatosis, and liver metastasis.  She has a history  of bilateral lower extremity edema secondary to bulky disease.  She was most recently treated with salvage FOLFOX which was dosed on 01/04/2018.  She was seen by physical therapy today and was referred back to our clinic to be seen due to pain in her anterior medial left thigh.  The patient continues to have bilateral lower extremity edema which is stable by her report.  She is having weeping of serous fluid from her legs due to the extent of her lymphedema.  She denies fevers, chills, or sweats.  She has compression dressings on her bilateral lower extremities over her calves.  She is awaiting a serial compression device for lymphatic drainage.  She has been approved for a chair to elevate her lower extremities.  She is awaiting  delivery.  Medications: I have reviewed the patient's current medications.  Allergies: No Known Allergies  Past Medical History:  Diagnosis Date  . Abdominal wall cellulitis 03/10/2016  . Anxiety   . Ascites 10/27/2017   Small volume noted on CT  . Aspiration of liquid 12/23/2017   pelvic cyst/mass  . Bowel obstruction ([place])   . Chronic back pain    lumbar    . Colostomy in place [facility])   . Depression   . DVT (deep venous thrombosis) ([place]) 10/27/2017   Non occlusive left common femoral vein  . Dyspnea   . 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 GERD (gastroesophageal reflux disease)   . Grade  I diastolic dysfunction 29/51/8841   ECHO   . History of chemotherapy last chemo 09-21-17  . History of hiatal hernia   . History of pelvic mass 11/2017   cyst  . History of pulmonary embolism 09/2017   Bilateral  . Hydronephrosis    right  . Hypokalemia    takes potassium  . Iron deficiency anemia 04/14/2016   treated w/ Iron infusions  . Liver metastasis ([place])    secondary to colorectal cancer  . Lower leg edema   . Metastatic colorectal cancer [facility]) dx 03/03/2016--- oncologist-- dr Benay Spice   metastatic colorectal carcinoma--  s/p  exp.  lap. for bowel obstruction--  rectal mass, peritoneal carcinomatosis, liver mets---  chemotherapy  . Scoliosis   . Skin rash    on legs seeing wound care specialist  . Spinal headache 12/21/2012  . Tachycardia    persistant since discharged from hospital 06/ 2017 due to anemia and deconditioning;  as of 09-02-2016 per pt no issues w/ heart racing in the past few weeks  . Ureteral obstruction, left   . Wears contact lenses     Past Surgical History:  Procedure Laterality Date  . BIOPSY N/A 03/03/2016   Procedure: BIOPSY OF PERITONEAL NODULE;  Surgeon: Erroll Luna, MD;  Location: [place];  Service: General;  Laterality: N/A;  . COLOSTOMY N/A 03/03/2016   Procedure: DIVERTING SIGMOID COLOSTOMY;  Surgeon: Erroll Luna, MD;  Location: [place];  Service: General;  Laterality: N/A;  . CYSTOSCOPY W/ RETROGRADES Right 10/13/2017   Procedure: CYSTOSCOPY WITH RETROGRADE PYELOGRAM/ STENT PLACEMENT;  Surgeon: Ceasar Mons, MD;  Location: [facility];  Service: Urology;  Laterality: Right;  ONLY NEEDS 30 MIN FOR BOTH PROCEDURES  . CYSTOSCOPY W/ URETERAL STENT PLACEMENT Left 10/09/2016   Procedure: CYSTOSCOPY WITH LEFT STENT REPLACEMENT 61F POLARIS STENT 24CM;  Surgeon: Carolan Clines, MD;  Location: [facility];  Service: Urology;  Laterality: Left;  . CYSTOSCOPY W/ URETERAL STENT PLACEMENT Left 03/16/2017   Procedure: CYSTOSCOPY WITH RETROGRADE PYELOGRAM WITH LEFT  STENT REMOVAL AND REPLACEMENT;  Surgeon: Carolan Clines, MD;  Location[facility];  Service: Urology;  Laterality: Left;  . CYSTOSCOPY W/ URETERAL STENT PLACEMENT Left 10/13/2017   Procedure: CYSTOSCOPY WITH STENT REPLACEMENT;  Surgeon: Ceasar Mons, MD;  Location: [facility];  Service: Urology;  Laterality: Left;  . CYSTOSCOPY WITH FULGERATION N/A 10/13/2017   Procedure: CYSTOSCOPY WITH FULGERATION;  Surgeon: Ceasar Mons, MD;  Location:  [facility];  Service: Urology;  Laterality: N/A;  . CYSTOSCOPY WITH RETROGRADE PYELOGRAM, URETEROSCOPY AND STENT PLACEMENT Left 03/18/2016   Procedure: CYSTOSCOPY WITH LEFT  RETROGRADE PYELOGRAM,  AND  POLARIS STENT PLACEMENT;  Surgeon: Carolan Clines, MD;  Location: [facility];  Service: Urology;  Laterality: Left;  . LAPAROTOMY N/A 03/03/2016   Procedure: EXPLORATORY LAPAROTOMY;  Surgeon: Erroll Luna, MD;  Location: [place];  Service: General;  Laterality: N/A;  . LUMBAR LAMINECTOMY  11/11/2012   L4-5  and Resection synovial cyst  . MAXIMUM ACCESS (MAS)POSTERIOR LUMBAR INTERBODY FUSION (PLIF) 1 LEVEL N/A 01/11/2014   Procedure: FOR MAXIMUM ACCESS (MAS) POSTERIOR LUMBAR INTERBODY FUSION Lumbar Five Sacral One;  Surgeon: [name], MD;  Location: [place] [facility];  Service: Neurosurgery;  Laterality: N/A;  FOR MAXIMUM ACCESS (MAS) POSTERIOR LUMBAR INTERBODY FUSION Lumbar Five Sacral One  . PORTACATH PLACEMENT  04/10/2016  . REPAIR CEREBROSPINAL FLUID LEAK POST RESECTION SYNOVIAL CYST  12/21/2012  . REVISION LUMBAR HARDWARE  01/26/2014  . TRANSTHORACIC ECHOCARDIOGRAM  06/17/2016   grade 1 diastolic function , ef 32-20%/  trivial TR  . WISDOM TOOTH EXTRACTION      Family History  Problem Relation Age of Onset  . Heart disease Mother   . Melanoma Mother   . Colon cancer Neg Hx   . Colon polyps Neg Hx   . Rectal cancer Neg Hx   . Stomach cancer Neg Hx   . Esophageal cancer Neg Hx     Social History   Socioeconomic History  . Marital status: Married    Spouse name: Purcell Nails  . Number of children: 2  . Years of education: Not on file  . Highest education level: Not on file  Occupational History  . Not on file  Social Needs  . Financial resource strain: Not on file  . Food insecurity:    Worry: Not on file    Inability: Not on file  . Transportation needs:    Medical: Not on file    Non-medical: Not on file  Tobacco Use  . Smoking status: Former Smoker     Packs/day: 0.25    Years: 1.00    Pack years: 0.25    Types: Cigarettes    Last attempt to quit: 10/03/1978    Years since quitting: 39.3  . Smokeless tobacco: Never Used  Substance and Sexual Activity  . Alcohol use: No    Alcohol/week: 0.0 oz  . Drug use: No  . Sexual activity: Yes    Birth control/protection: Post-menopausal  Lifestyle  . Physical activity:    Days per week: Not on file    Minutes per session: Not on file  . Stress: Not on file  Relationships  . Social connections:    Talks on phone: Not on file    Gets together: Not on file    Attends religious service: Not on file    Active member of club or organization: Not on file    Attends meetings of clubs or organizations: Not on file    Relationship status: Not on file  . Intimate partner violence:    Fear of current or ex partner: Not on file    Emotionally abused: Not on file    Physically abused: Not on file    Forced sexual activity: Not on file  Other Topics Concern  . Not on file  Social History Narrative   03/20/16: Married, husband Purcell Nails for 33+ years   #2 children-grown    Past Medical History, Surgical history, Social history, and Family history were reviewed and updated as appropriate.   Please see review of systems for further details on the patient's review from today.   Review of Systems:  Review of Systems  Constitutional: Negative for chills, diaphoresis and fever.  Respiratory: Negative for cough, choking, chest tightness, shortness of breath and wheezing.   Cardiovascular: Positive for leg swelling. Negative for chest pain and palpitations.  Musculoskeletal: Positive for gait problem (The patient is ambulating with the use of a rolling walker.).  Skin:       Weeping of the bilateral lower extremities secondary to profound lymphedema. Erythema, warmth, and tenderness of the left medial anterior thigh.    Objective:   Physical Exam:  BP 132/72 (BP Location: Left Arm, Patient  Position: Sitting)   Pulse (!) 115   Temp 98.2 F (36.8 C) (Oral)   Resp 18   Ht 5\' 5"  (1.651 m)   Wt 149 lb 6.4 oz (67.8  kg)   SpO2 100%   BMI 24.86 kg/m  ECOG: 2  Physical Exam  Constitutional: No distress.  HENT:  Head: Normocephalic and atraumatic.  Cardiovascular: S1 normal and S2 normal. Tachycardia present.  Pulmonary/Chest: Effort normal and breath sounds normal. No stridor. No respiratory distress. She has no wheezes. She has no rales.  Neurological: She is alert. Coordination (The patient is ambulating with the use of a rolling walker.) abnormal.  Skin: She is not diaphoretic.  1+ pitting edema to the proximal bilateral thighs. Erythema, increased warmth, and tenderness of the left medial anterior thigh.  Psychiatric: She has a normal mood and affect. Her behavior is normal. Judgment and thought content normal.    Lab Review:     Component Value Date/Time   NA 134 (L) 01/04/2018 0847   NA 139 09/21/2017 0746   K 3.9 01/04/2018 0847   K 3.3 (L) 09/21/2017 0746   CL 103 01/04/2018 0847   CL 101 12/01/2012 0924   CO2 26 01/04/2018 0847   CO2 28 09/21/2017 0746   GLUCOSE 100 01/04/2018 0847   GLUCOSE 92 09/21/2017 0746   GLUCOSE 94 12/01/2012 0924   BUN 11 01/04/2018 0847   BUN 9.3 09/21/2017 0746   CREATININE 0.58 (L) 01/04/2018 0847   CREATININE 0.6 09/21/2017 0746   CALCIUM 8.2 (L) 01/04/2018 0847   CALCIUM 8.7 09/21/2017 0746   PROT 4.7 (L) 01/04/2018 0847   PROT 6.2 (L) 09/21/2017 0746   ALBUMIN 1.8 (L) 01/04/2018 0847   ALBUMIN 3.2 (L) 09/21/2017 0746   AST 13 01/04/2018 0847   AST 19 09/21/2017 0746   ALT 10 01/04/2018 0847   ALT 12 09/21/2017 0746   ALKPHOS 70 01/04/2018 0847   ALKPHOS 105 09/21/2017 0746   BILITOT <0.2 (L) 01/04/2018 0847   BILITOT 0.41 09/21/2017 0746   GFRNONAA >60 01/04/2018 0847   GFRAA >60 01/04/2018 0847       Component Value Date/Time   WBC 4.6 01/04/2018 0847   WBC 4.3 12/23/2017 0719   RBC 3.24 (L) 01/04/2018  0847   HGB 9.3 (L) 12/23/2017 0719   HGB 13.6 09/21/2017 0746   HCT 25.8 (L) 01/04/2018 0847   HCT 41.7 09/21/2017 0746   PLT 735 (H) 01/04/2018 0847   PLT 357 09/21/2017 0746   MCV 79.6 01/04/2018 0847   MCV 89.5 09/21/2017 0746   MCH 25.3 01/04/2018 0847   MCHC 31.8 01/04/2018 0847   RDW 18.8 (H) 01/04/2018 0847   RDW 17.1 (H) 09/21/2017 0746   LYMPHSABS 0.8 (L) 01/04/2018 0847   LYMPHSABS 1.0 09/21/2017 0746   MONOABS 0.6 01/04/2018 0847   MONOABS 0.5 09/21/2017 0746   EOSABS 0.3 01/04/2018 0847   EOSABS 0.3 09/21/2017 0746   BASOSABS 0.1 01/04/2018 0847   BASOSABS 0.0 09/21/2017 0746   -------------------------------  Imaging from last 24 hours (if applicable):  Radiology interpretation: Ct Pelvis W Contrast  Result Date: 12/23/2017 CLINICAL DATA:  Cystic pelvic mass with leg edema. EXAM: CT PELVIS WITH CONTRAST TECHNIQUE: Multidetector CT imaging of the pelvis was performed using the standard protocol following the bolus administration of intravenous contrast. CONTRAST:  34mL ISOVUE-300 IOPAMIDOL (ISOVUE-300) INJECTION 61% COMPARISON:  None. FINDINGS: Urinary Tract: Urinary bladder is distorted due to the presence of a large central pelvic mass. Lower portion of the right kidney is visualized, demonstrating moderate hydronephrosis. Left intrarenal collecting system not visualized. The lower portion of bilateral internal ureteral stents are visualized. Bowel: Left lower quadrant stoma compatible with descending  colostomy and mucous fistula. Vascular/Lymphatic: There is marked mass-effect on the external iliac veins bilaterally due to the pelvic mass. Patency of the left common iliac vein between the sidewall in the mass lesion cannot be confirmed. Right external iliac vein is markedly attenuated but does appear patent as it passes between the pelvic sidewall and the mass. Reproductive:  Not well seen. Other:  No substantial free fluid within the visualized pelvis. Musculoskeletal:  Diffuse body wall edema. Status post lumbar fusion. IMPRESSION: 1. Large complex cystic mass filling the central pelvis. This generates substantial mass-effect on the external iliac veins bilaterally. Left external iliac vein patency cannot be confirmed on this study as mass-effect from the mass appears to completely attenuate the vessel. Right external iliac vein appears markedly attenuated by mass-effect, but does appear to remain patent. 2. Extensive body wall edema. 3. Right hydronephrosis with incompletely visualized bilateral ureteral stents. Electronically Signed   By: Misty Stanley M.D.   On: 12/23/2017 11:51   Ct Aspiration  Result Date: 12/23/2017 INDICATION: Metastatic colon cancer recurrent large pelvic cystic mass resulting in iliac venous compression and lower extremity symptomatic edema. EXAM: CT-GUIDED ASPIRATION OF THE RECURRENT LARGE PELVIC COMPLEX CYSTIC MASS MEDICATIONS: 1% LIDOCAINE LOCAL ANESTHESIA/SEDATION: 3.0 mg IV Versed 100 mcg IV Fentanyl Moderate Sedation Time:  41 MINUTES The patient was continuously monitored during the procedure by the interventional radiology nurse under my direct supervision. COMPLICATIONS: None immediate. TECHNIQUE: Informed written consent was obtained from the patient after a thorough discussion of the procedural risks, benefits and alternatives. All questions were addressed. Maximal Sterile Barrier Technique was utilized including caps, mask, sterile gowns, sterile gloves, sterile drape, hand hygiene and skin antiseptic. A timeout was performed prior to the initiation of the procedure. PROCEDURE: Previous imaging reviewed. Patient positioned prone. Noncontrast localization CT performed. The large recurrent complex septated cystic mass was localized. Overlying skin marked for a right posterior trans gluteal approach. Under sterile conditions and local anesthesia, a 17 gauge 11.8 cm access needle was advanced into the complex cystic mass. Needle position  confirmed with CT. Syringe aspiration yielded 550 cc bloody debris-filled fluid. Postprocedure imaging demonstrates decompression of the dominant cystic component. Several loculations remain. Needle removed. No immediate complication. Patient tolerated the procedure well. FINDINGS: Imaging confirms right trans gluteal needle access for aspiration of the recurrent complex pelvic cystic mass IMPRESSION: Successful CT-guided aspiration of the cystic pelvic mass yielding 550 cc blood-tinged debris-filled fluid. Electronically Signed   By: Jerilynn Mages.  Shick M.D.   On: 12/23/2017 11:44        This patient was seen with Dr. Benay Spice with my treatment plan reviewed with him. He expressed agreement with my medical management of this patient.  This was a shared visit with Sandi Mealy.  Ms. Grilliot was interviewed and examined.  She will be treated for a left lower extremity cellulitis.  She will return as scheduled for the next cycle of FOLFOX on 01/18/2018.  [name], MD

## 2018-01-17 ENCOUNTER — Other Ambulatory Visit: Payer: Self-pay | Admitting: Oncology

## 2018-01-18 ENCOUNTER — Inpatient Hospital Stay: Payer: BLUE CROSS/BLUE SHIELD

## 2018-01-18 ENCOUNTER — Other Ambulatory Visit: Payer: Self-pay

## 2018-01-18 ENCOUNTER — Telehealth: Payer: Self-pay | Admitting: Oncology

## 2018-01-18 ENCOUNTER — Inpatient Hospital Stay (HOSPITAL_BASED_OUTPATIENT_CLINIC_OR_DEPARTMENT_OTHER): Payer: BLUE CROSS/BLUE SHIELD | Admitting: Oncology

## 2018-01-18 VITALS — BP 111/65 | HR 99 | Temp 98.0°F | Resp 16

## 2018-01-18 VITALS — BP 106/61 | HR 104 | Temp 98.5°F | Resp 17 | Ht 65.0 in

## 2018-01-18 DIAGNOSIS — C787 Secondary malignant neoplasm of liver and intrahepatic bile duct: Secondary | ICD-10-CM

## 2018-01-18 DIAGNOSIS — R600 Localized edema: Secondary | ICD-10-CM

## 2018-01-18 DIAGNOSIS — D509 Iron deficiency anemia, unspecified: Secondary | ICD-10-CM

## 2018-01-18 DIAGNOSIS — L03116 Cellulitis of left lower limb: Secondary | ICD-10-CM

## 2018-01-18 DIAGNOSIS — C786 Secondary malignant neoplasm of retroperitoneum and peritoneum: Secondary | ICD-10-CM | POA: Diagnosis not present

## 2018-01-18 DIAGNOSIS — M79604 Pain in right leg: Secondary | ICD-10-CM

## 2018-01-18 DIAGNOSIS — Z95828 Presence of other vascular implants and grafts: Secondary | ICD-10-CM

## 2018-01-18 DIAGNOSIS — I2699 Other pulmonary embolism without acute cor pulmonale: Secondary | ICD-10-CM | POA: Diagnosis not present

## 2018-01-18 DIAGNOSIS — C20 Malignant neoplasm of rectum: Secondary | ICD-10-CM | POA: Diagnosis not present

## 2018-01-18 DIAGNOSIS — Z7901 Long term (current) use of anticoagulants: Secondary | ICD-10-CM | POA: Diagnosis not present

## 2018-01-18 DIAGNOSIS — D473 Essential (hemorrhagic) thrombocythemia: Secondary | ICD-10-CM

## 2018-01-18 DIAGNOSIS — I82412 Acute embolism and thrombosis of left femoral vein: Secondary | ICD-10-CM | POA: Diagnosis not present

## 2018-01-18 DIAGNOSIS — M79605 Pain in left leg: Secondary | ICD-10-CM

## 2018-01-18 LAB — CBC WITH DIFFERENTIAL (CANCER CENTER ONLY)
BASOS ABS: 0 10*3/uL (ref 0.0–0.1)
BASOS PCT: 0 %
Eosinophils Absolute: 0.1 10*3/uL (ref 0.0–0.5)
Eosinophils Relative: 1 %
HEMATOCRIT: 21 % — AB (ref 34.8–46.6)
Hemoglobin: 6.4 g/dL — CL (ref 11.6–15.9)
LYMPHS PCT: 9 %
Lymphs Abs: 0.6 10*3/uL — ABNORMAL LOW (ref 0.9–3.3)
MCH: 23.8 pg — ABNORMAL LOW (ref 25.1–34.0)
MCHC: 30.5 g/dL — ABNORMAL LOW (ref 31.5–36.0)
MCV: 78.1 fL — AB (ref 79.5–101.0)
MONO ABS: 1.4 10*3/uL — AB (ref 0.1–0.9)
Monocytes Relative: 20 %
NEUTROS ABS: 4.8 10*3/uL (ref 1.5–6.5)
NEUTROS PCT: 70 %
Platelet Count: 597 10*3/uL — ABNORMAL HIGH (ref 145–400)
RBC: 2.69 MIL/uL — AB (ref 3.70–5.45)
RDW: 19.3 % — AB (ref 11.2–14.5)
WBC: 7 10*3/uL (ref 3.9–10.3)

## 2018-01-18 LAB — CMP (CANCER CENTER ONLY)
ALBUMIN: 1.5 g/dL — AB (ref 3.5–5.0)
ALT: 8 U/L (ref 0–55)
ANION GAP: 5 (ref 3–11)
AST: 11 U/L (ref 5–34)
Alkaline Phosphatase: 67 U/L (ref 40–150)
BUN: 12 mg/dL (ref 7–26)
CALCIUM: 8.2 mg/dL — AB (ref 8.4–10.4)
CO2: 23 mmol/L (ref 22–29)
Chloride: 104 mmol/L (ref 98–109)
Creatinine: 0.59 mg/dL — ABNORMAL LOW (ref 0.60–1.10)
GLUCOSE: 118 mg/dL (ref 70–140)
Potassium: 3.9 mmol/L (ref 3.5–5.1)
Sodium: 132 mmol/L — ABNORMAL LOW (ref 136–145)
TOTAL PROTEIN: 4.9 g/dL — AB (ref 6.4–8.3)

## 2018-01-18 LAB — PREPARE RBC (CROSSMATCH)

## 2018-01-18 LAB — ABO/RH: ABO/RH(D): A NEG

## 2018-01-18 LAB — MAGNESIUM: Magnesium: 1.8 mg/dL (ref 1.7–2.4)

## 2018-01-18 MED ORDER — PALONOSETRON HCL INJECTION 0.25 MG/5ML
0.2500 mg | Freq: Once | INTRAVENOUS | Status: AC
  Administered 2018-01-18: 0.25 mg via INTRAVENOUS

## 2018-01-18 MED ORDER — SODIUM CHLORIDE 0.9 % IV SOLN
2400.0000 mg/m2 | INTRAVENOUS | Status: DC
  Administered 2018-01-18: 4200 mg via INTRAVENOUS
  Filled 2018-01-18: qty 84

## 2018-01-18 MED ORDER — DEXAMETHASONE SODIUM PHOSPHATE 10 MG/ML IJ SOLN
INTRAMUSCULAR | Status: AC
  Filled 2018-01-18: qty 1

## 2018-01-18 MED ORDER — SODIUM CHLORIDE 0.9 % IJ SOLN
10.0000 mL | INTRAMUSCULAR | Status: DC | PRN
  Administered 2018-01-18: 10 mL via INTRAVENOUS
  Filled 2018-01-18: qty 10

## 2018-01-18 MED ORDER — LEUCOVORIN CALCIUM INJECTION 350 MG
400.0000 mg/m2 | Freq: Once | INTRAVENOUS | Status: AC
  Administered 2018-01-18: 704 mg via INTRAVENOUS
  Filled 2018-01-18: qty 35.2

## 2018-01-18 MED ORDER — DEXTROSE 5 % IV SOLN
Freq: Once | INTRAVENOUS | Status: AC
  Administered 2018-01-18: 10:00:00 via INTRAVENOUS

## 2018-01-18 MED ORDER — OXALIPLATIN CHEMO INJECTION 100 MG/20ML
85.0000 mg/m2 | Freq: Once | INTRAVENOUS | Status: AC
  Administered 2018-01-18: 150 mg via INTRAVENOUS
  Filled 2018-01-18: qty 20

## 2018-01-18 MED ORDER — PALONOSETRON HCL INJECTION 0.25 MG/5ML
INTRAVENOUS | Status: AC
Start: 2018-01-18 — End: ?
  Filled 2018-01-18: qty 5

## 2018-01-18 MED ORDER — DEXAMETHASONE SODIUM PHOSPHATE 10 MG/ML IJ SOLN
10.0000 mg | Freq: Once | INTRAMUSCULAR | Status: AC
  Administered 2018-01-18: 10 mg via INTRAVENOUS

## 2018-01-18 MED ORDER — SODIUM CHLORIDE 0.9 % IV SOLN
5.0000 mg/kg | Freq: Once | INTRAVENOUS | Status: AC
  Administered 2018-01-18: 350 mg via INTRAVENOUS
  Filled 2018-01-18: qty 14

## 2018-01-18 MED ORDER — FLUOROURACIL CHEMO INJECTION 2.5 GM/50ML
400.0000 mg/m2 | Freq: Once | INTRAVENOUS | Status: AC
  Administered 2018-01-18: 700 mg via INTRAVENOUS
  Filled 2018-01-18: qty 14

## 2018-01-18 MED ORDER — SODIUM CHLORIDE 0.9 % IV SOLN
250.0000 mL | Freq: Once | INTRAVENOUS | Status: AC
  Administered 2018-01-18: 250 mL via INTRAVENOUS

## 2018-01-18 NOTE — Progress Notes (Signed)
hgb of 6.4 reported by lab. MD made  aware. Ok to treat with urine protein of 100 from 4/8 per Dr. Benay Spice

## 2018-01-18 NOTE — Patient Instructions (Signed)
[facility] Discharge Instructions for Patients Receiving Chemotherapy  Today you received the following chemotherapy agents Oxaliplatin, Leucovorin, 5FU & Avastin. To help prevent nausea and vomiting after your treatment, we encourage you to take your nausea medication as directed   If you develop nausea and vomiting that is not controlled by your nausea medication, call the clinic.   BELOW ARE SYMPTOMS THAT SHOULD BE REPORTED IMMEDIATELY:  *FEVER GREATER THAN 100.5 F  *CHILLS WITH OR WITHOUT FEVER  NAUSEA AND VOMITING THAT IS NOT CONTROLLED WITH YOUR NAUSEA MEDICATION  *UNUSUAL SHORTNESS OF BREATH  *UNUSUAL BRUISING OR BLEEDING  TENDERNESS IN MOUTH AND THROAT WITH OR WITHOUT PRESENCE OF ULCERS  *URINARY PROBLEMS  *BOWEL PROBLEMS  UNUSUAL RASH Items with * indicate a potential emergency and should be followed up as soon as possible.  Feel free to call the clinic should you have any questions or concerns. The clinic phone number is [phone].  Please show the [facility] at check-in to the Emergency Department and triage nurse.   Blood Transfusion, Adult A blood transfusion is a procedure in which you receive donated blood, including plasma, platelets, and red blood cells, through an IV tube. You may need a blood transfusion because of illness, surgery, or injury. The blood may come from a donor. You may also be able to donate blood for yourself (autologous blood donation) before a surgery if you know that you might require a blood transfusion. The blood given in a transfusion is made up of different types of cells. You may receive:  Red blood cells. These carry oxygen to the cells in the body.  White blood cells. These help you fight infections.  Platelets. These help your blood to clot.  Plasma. This is the liquid part of your blood and it helps with fluid imbalances.  If you have hemophilia or another clotting disorder, you may also receive  other types of blood products. Tell a health care provider about:  Any allergies you have.  All medicines you are taking, including vitamins, herbs, eye drops, creams, and over-the-counter medicines.  Any problems you or family members have had with anesthetic medicines.  Any blood disorders you have.  Any surgeries you have had.  Any medical conditions you have, including any recent fever or cold symptoms.  Whether you are pregnant or may be pregnant.  Any previous reactions you have had during a blood transfusion. What are the risks? Generally, this is a safe procedure. However, problems may occur, including:  Having an allergic reaction to something in the donated blood. Hives and itching may be symptoms of this type of reaction.  Fever. This may be a reaction to the white blood cells in the transfused blood. Nausea or chest pain may accompany a fever.  Iron overload. This can happen from having many transfusions.  Transfusion-related acute lung injury (TRALI). This is a rare reaction that causes lung damage. The cause is not known.TRALI can occur within hours of a transfusion or several days later.  Sudden (acute) or delayed hemolytic reactions. This happens if your blood does not match the cells in your transfusion. Your body's defense system (immune system) may try to attack the new cells. This complication is rare. The symptoms include fever, chills, nausea, and low back pain or chest pain.  Infection or disease transmission. This is rare.  What happens before the procedure?  You will have a blood test to determine your blood type. This is necessary to know  what kind of blood your body will accept and to match it to the donor blood.  If you are going to have a planned surgery, you may be able to do an autologous blood donation. This may be done in case you need to have a transfusion.  If you have had an allergic reaction to a transfusion in the past, you may be given  medicine to help prevent a reaction. This medicine may be given to you by mouth or through an IV tube.  You will have your temperature, blood pressure, and pulse monitored before the transfusion.  Follow instructions from your health care provider about eating and drinking restrictions.  Ask your health care provider about: ? Changing or stopping your regular medicines. This is especially important if you are taking diabetes medicines or blood thinners. ? Taking medicines such as aspirin and ibuprofen. These medicines can thin your blood. Do not take these medicines before your procedure if your health care provider instructs you not to. What happens during the procedure?  An IV tube will be inserted into one of your veins.  The bag of donated blood will be attached to your IV tube. The blood will then enter through your vein.  Your temperature, blood pressure, and pulse will be monitored regularly during the transfusion. This monitoring is done to detect early signs of a transfusion reaction.  If you have any signs or symptoms of a reaction, your transfusion will be stopped and you may be given medicine.  When the transfusion is complete, your IV tube will be removed.  Pressure may be applied to the IV site for a few minutes.  A bandage (dressing) will be applied. The procedure may vary among health care providers and hospitals. What happens after the procedure?  Your temperature, blood pressure, heart rate, breathing rate, and blood oxygen level will be monitored often.  Your blood may be tested to see how you are responding to the transfusion.  You may be warmed with fluids or blankets to maintain a normal body temperature. Summary  A blood transfusion is a procedure in which you receive donated blood, including plasma, platelets, and red blood cells, through an IV tube.  Your temperature, blood pressure, and pulse will be monitored before, during, and after the  transfusion.  Your blood may be tested after the transfusion to see how your body has responded. This information is not intended to replace advice given to you by your health care provider. Make sure you discuss any questions you have with your health care provider. Document Released: 09/12/2000 Document Revised: 06/12/2016 Document Reviewed: 06/12/2016 Elsevier Interactive Patient Education  [employer].

## 2018-01-18 NOTE — Telephone Encounter (Signed)
Scheduled appt per 4/22 los - patient is aware of appt date and time. No print out wanted .

## 2018-01-18 NOTE — Progress Notes (Signed)
Ok to tx today with HGB 6.4, pt to get 2 units PRBC, 1 today 01/18/2018 and 1 Wednesday 01/20/2018

## 2018-01-18 NOTE — Progress Notes (Signed)
[facility] OFFICE PROGRESS NOTE   Diagnosis: Colon cancer  INTERVAL HISTORY:   Ms. Rubalcava returns as scheduled.  She was seen on 01/14/2018 with persistent leg edema and increased left leg discomfort.  She was diagnosed with cellulitis of the left upper thigh.  She was prescribed Keflex.  She reports improvement in the left upper thigh pain and erythema, but she continues to have severe pain in the lower legs.  The leg edema persist.  This limits her ability to ambulate.  It is becoming harder for her family to care for her in the home.  No neuropathy symptoms.  No bleeding.  Objective:  Vital signs in last 24 hours:  Blood pressure 106/61, pulse (!) 104, temperature 98.5 F (36.9 C), temperature source Oral, resp. rate 17, height '5\' 5"'$  (1.651 m), SpO2 100 %.    HEENT: No thrush or ulcers Resp: Lungs clear bilaterally Cardio: Regular rate and rhythm GI: No hepatomegaly, nontender, left lower quadrant colostomy Vascular: Tense edema throughout the left greater than right leg.  Wrappings in place at the lower leg bilaterally  Skin: Decreased erythema at the left upper thigh  Portacath/PICC-without erythema  Lab Results: Hemoglobin 6.4, platelets 597,000, white count 7, ANC 4.8  CMP     Component Value Date/Time   NA 134 (L) 01/04/2018 0847   NA 139 09/21/2017 0746   K 3.9 01/04/2018 0847   K 3.3 (L) 09/21/2017 0746   CL 103 01/04/2018 0847   CL 101 12/01/2012 0924   CO2 26 01/04/2018 0847   CO2 28 09/21/2017 0746   GLUCOSE 100 01/04/2018 0847   GLUCOSE 92 09/21/2017 0746   GLUCOSE 94 12/01/2012 0924   BUN 11 01/04/2018 0847   BUN 9.3 09/21/2017 0746   CREATININE 0.58 (L) 01/04/2018 0847   CREATININE 0.6 09/21/2017 0746   CALCIUM 8.2 (L) 01/04/2018 0847   CALCIUM 8.7 09/21/2017 0746   PROT 4.7 (L) 01/04/2018 0847   PROT 6.2 (L) 09/21/2017 0746   ALBUMIN 1.8 (L) 01/04/2018 0847   ALBUMIN 3.2 (L) 09/21/2017 0746   AST 13 01/04/2018 0847   AST 19  09/21/2017 0746   ALT 10 01/04/2018 0847   ALT 12 09/21/2017 0746   ALKPHOS 70 01/04/2018 0847   ALKPHOS 105 09/21/2017 0746   BILITOT <0.2 (L) 01/04/2018 0847   BILITOT 0.41 09/21/2017 0746   GFRNONAA >60 01/04/2018 0847   GFRAA >60 01/04/2018 0847    Lab Results  Component Value Date   CEA1 2.29 01/04/2018     Medications: I have reviewed the patient's current medications.   Assessment/Plan: 1.Metastatic colorectal cancer-status post an exploratory laparotomy 03/03/2016 revealing a proximal rectal mass, peritoneal carcinomatosis, and liver metastases  Biopsy of peritoneal nodules 03/03/2016 confirmed metastatic adenocarcinoma consistent with a colon primary  Foundation 1 testing-MSI-stable, tumor mutation burden-low, no BRAF NRAS or KRAS mutation  Cycle 1 FOLFIRI/PANITUMUMAB 04/14/2016  Cycle 2 FOLFIRI/panitumumab 04/28/2016  Cycle 3 FOLFIRI/panitumumab 05/12/2016  Cycle 4 FOLFIRI/panitumumab 05/27/2016  Cycle 5 FOLFIRI/panitumumab 06/09/2016  Restaging CT abdomen/pelvis 06/20/2016-no evidence of disease progression, decreased left hepatic lesion  Cycle 6 FOLFIRI/panitumumab 06/23/2016  Cycle 7 FOLFIRI/panitumumab 07/07/2016  Cycle 8 FOLFIRI/panitumumab 07/21/2016  Cycle 9 FOLFIRI/PANITUMUMAB 08/04/2016  Cycle 10 FOLFIRI/panitumumab 08/18/2016  Restaging CT 08/29/2016 with interval decrease in the size of the medial segment left liver lesion. Lesion identified previously in the rectosigmoid colon not evident on the current study.  5-FU/PANITUMUMAB 09/01/2016  Xeloda 7 days on/7 days off and PANITUMUMAB every 3 weeks  09/15/2016 (awaiting insurance approval for Xeloda 09/15/2016)  CT abdomen/pelvis 12/25/2016-unchanged 5 mm right hepatic lesion, resolution of medial segment left liver lesion, no new liver lesion. Residual soft tissue fullness in the sigmoid , persistent distal esophageal wall thickening  Continuationof PANITUMUMAB every 3 weeks and Xeloda 7  days on/7 days off  CT 06/19/2017-new cystic/solid lesion in the left adnexa  Xeloda/panitumumab continued  CT 10/08/2016-progression of cystic pelvic mass  Cycle 1 FOLFIRI/Panitumumab 10/21/2017  Cystic pelvic mass aspirated 10/30/2017  Cycle 2 FOLFIRI/panitumumab 11/09/2017  Cycle 3 FOLFIRI/panitumumab 11/23/2017  Cycle 4 FOLFIRI/Panitumumab 12/07/2017  Cycle 5 FOLFIRI/panitumumab 12/21/2017  CT pelvis 12/23/2017- complex cystic pelvic mass with mass-effect on the pelvic vasculature  CT aspiration of cystic pelvic mass 12/23/2017  Cycle 1 FOLFOX/Avastin 01/04/2018  Cycle 2 FOLFOX/Avastin 01/18/2018  2.History of aBowel obstruction secondary to #1  3. Chronic back pain-maintained on methadone  4. Essential thrombocytosis  5. Early obstruction of the left ureter noted at the time of surgery 03/03/2016-Dr. Tannenbaum placed a left double-J stent 03/18/2016  New onset right hydronephrosis 10/11/2017  Right ureter stent placement 10/13/2017  6. Abdominal wall cellulitis 03/10/2016, blood cultures positive for coagulase negative staphylococcus-methicillin-resistant, status post treatment with vancomycin and Bactrim   8. Port-A-Cath placement 04/10/2016, interventional radiology  9. Iron deficiency anemia. Feraheme 11/05/2015 , 11/12/2015,and 04/14/2016-persistent anemia and red cell microcytosis, oral iron resumed 11/17/2016-stable  10. Pain/tenderness left posterior iliac region-resolved  11. Hypokalemia-started on potassium replacement 06/09/2016  12. History of skin rashand paronychiasecondary to [facility], she continues minocycline, moisturizers, and fluticasone as needed  13.Acute onset right low back pain 10/11/2017-likely secondary to obstruction of the right kidney;left ureter stent exchange and right ureter stent placement 10/13/2017  14.Left leg pain and edema-bilateral lower extremity venous Doppler negative for DVT1/15/2019 and  10/19/2017. Left common femoral DVT noted on CT venogram 10/27/2017;CT aspiration of pelvic cystic mass 10/30/2017 with subsequent marked improvement of leg edema.  15. Admission 10/22/2017 with bilateral pulmonary embolism, heparin drip initiated, converted to Lovenox 10/23/2017, converted to Xarelto during the 10/27/2017 hospital admission  16. Admission 10/27/2017 with increased back/leg pain and a fever-blood and urine cultures positive fora resistant E. coli   Disposition: Ms. Schuermann has metastatic colorectal cancer.  She will complete cycles 2 of salvage FOLFOX/Avastin therapy today.  I discussed treatment options and management of the leg edema in detail with Ms. Shawhan and her family.  She is symptomatic with severe bilateral leg edema causing pain, limited mobility, and weeping of the skin.  She is currently completing antibiotics for treatment of a left thigh cellulitis.  The plan is to continue leg elevation, lymphedema wraps, and I will consult with physical therapy to discuss other treatment options.  She gained significant relief of the leg edema when she underwent aspiration of a pelvic mass in February.  I reviewed the most recent CT images and discussed the case with interventional radiologist.  She will be scheduled for repeat aspiration of the pelvic mass this week.  Her husband plans to obtain help caring for her in the home.  Ms. Mckeon will return for an office visit on 02/01/2018.  30 minutes were spent with the patient today.  The majority of the time was used for counseling and coordination of care.  [name] Coder, MD  01/18/2018  9:12 AM

## 2018-01-19 ENCOUNTER — Telehealth: Payer: Self-pay

## 2018-01-19 NOTE — Telephone Encounter (Signed)
Spoke to pt to confirm CT appt. CT scheduled. Also informed pt that Dr. Benay Spice spoke with PT and patient will meet with them tomorrow to discuss options for home therapy.

## 2018-01-20 ENCOUNTER — Ambulatory Visit: Payer: BLUE CROSS/BLUE SHIELD

## 2018-01-20 ENCOUNTER — Inpatient Hospital Stay: Payer: BLUE CROSS/BLUE SHIELD

## 2018-01-20 VITALS — BP 123/79 | HR 88 | Temp 97.9°F | Resp 16

## 2018-01-20 DIAGNOSIS — I890 Lymphedema, not elsewhere classified: Secondary | ICD-10-CM

## 2018-01-20 DIAGNOSIS — C20 Malignant neoplasm of rectum: Secondary | ICD-10-CM

## 2018-01-20 DIAGNOSIS — D473 Essential (hemorrhagic) thrombocythemia: Secondary | ICD-10-CM

## 2018-01-20 MED ORDER — SODIUM CHLORIDE 0.9% FLUSH
10.0000 mL | INTRAVENOUS | Status: DC | PRN
  Filled 2018-01-20: qty 10

## 2018-01-20 MED ORDER — SODIUM CHLORIDE 0.9 % IV SOLN
250.0000 mL | Freq: Once | INTRAVENOUS | Status: AC
  Administered 2018-01-20: 250 mL via INTRAVENOUS

## 2018-01-20 MED ORDER — HEPARIN SOD (PORK) LOCK FLUSH 100 UNIT/ML IV SOLN
500.0000 [IU] | Freq: Once | INTRAVENOUS | Status: AC | PRN
  Administered 2018-01-20: 500 [IU]
  Filled 2018-01-20: qty 5

## 2018-01-20 MED ORDER — SODIUM CHLORIDE 0.9% FLUSH
10.0000 mL | INTRAVENOUS | Status: AC | PRN
  Administered 2018-01-20: 10 mL
  Filled 2018-01-20: qty 10

## 2018-01-20 NOTE — Therapy (Signed)
[place], [place], [address] Phone: [phone]   Fax:  [phone]  Physical Therapy Treatment  Patient Details  Name: Carolyn Barton MRN: [id number] Date of Birth: [birth year] Referring Provider: Dr. Benay Spice    Encounter Date: 01/20/2018  PT End of Session - 01/20/18 1300    Visit Number  5    Number of Visits  17    Date for PT Re-Evaluation  03/03/18    PT Start Time  1107    PT Stop Time  1202    PT Time Calculation (min)  55 min    Activity Tolerance  Patient tolerated treatment well    Behavior During Therapy  [facility] for tasks assessed/performed       Past Medical History:  Diagnosis Date  . Abdominal wall cellulitis 03/10/2016  . Anxiety   . Ascites 10/27/2017   Small volume noted on CT  . Aspiration of liquid 12/23/2017   pelvic cyst/mass  . Bowel obstruction ([place])   . Chronic back pain    lumbar    . Colostomy in place [facility])   . Depression   . DVT (deep venous thrombosis) ([place]) 10/27/2017   Non occlusive left common femoral vein  . Dyspnea   . 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 GERD (gastroesophageal reflux disease)   . Grade I diastolic dysfunction 76/28/3151   ECHO   . History of chemotherapy last chemo 09-21-17  . History of hiatal hernia   . History of pelvic mass 11/2017   cyst  . History of pulmonary embolism 09/2017   Bilateral  . Hydronephrosis    right  . Hypokalemia    takes potassium  . Iron deficiency anemia 04/14/2016   treated w/ Iron infusions  . Liver metastasis ([place])    secondary to colorectal cancer  . Lower leg edema   . Metastatic colorectal cancer [facility]) dx 03/03/2016--- oncologist-- dr Benay Spice   metastatic colorectal carcinoma--  s/p  exp. lap. for bowel obstruction--  rectal mass, peritoneal carcinomatosis, liver mets---  chemotherapy  . Scoliosis   . Skin rash    on legs seeing wound care specialist  . Spinal headache  12/21/2012  . Tachycardia    persistant since discharged from hospital 06/ 2017 due to anemia and deconditioning;  as of 09-02-2016 per pt no issues w/ heart racing in the past few weeks  . Ureteral obstruction, left   . Wears contact lenses     Past Surgical History:  Procedure Laterality Date  . BIOPSY N/A 03/03/2016   Procedure: BIOPSY OF PERITONEAL NODULE;  Surgeon: Erroll Luna, MD;  Location: [place];  Service: General;  Laterality: N/A;  . COLOSTOMY N/A 03/03/2016   Procedure: DIVERTING SIGMOID COLOSTOMY;  Surgeon: Erroll Luna, MD;  Location: [place];  Service: General;  Laterality: N/A;  . CYSTOSCOPY W/ RETROGRADES Right 10/13/2017   Procedure: CYSTOSCOPY WITH RETROGRADE PYELOGRAM/ STENT PLACEMENT;  Surgeon: Ceasar Mons, MD;  Location: [facility];  Service: Urology;  Laterality: Right;  ONLY NEEDS 30 MIN FOR BOTH PROCEDURES  . CYSTOSCOPY W/ URETERAL STENT PLACEMENT Left 10/09/2016   Procedure: CYSTOSCOPY WITH LEFT STENT REPLACEMENT 51F POLARIS STENT 24CM;  Surgeon: Carolan Clines, MD;  Location: [facility];  Service: Urology;  Laterality: Left;  . CYSTOSCOPY W/ URETERAL STENT PLACEMENT Left 03/16/2017   Procedure: CYSTOSCOPY WITH RETROGRADE PYELOGRAM WITH LEFT  STENT REMOVAL AND REPLACEMENT;  Surgeon: Carolan Clines, MD;  Location: [place];  Service: Urology;  Laterality: Left;  . CYSTOSCOPY W/ URETERAL STENT PLACEMENT Left 10/13/2017   Procedure: CYSTOSCOPY WITH STENT REPLACEMENT;  Surgeon: Ceasar Mons, MD;  Location: [facility];  Service: Urology;  Laterality: Left;  . CYSTOSCOPY WITH FULGERATION N/A 10/13/2017   Procedure: CYSTOSCOPY WITH FULGERATION;  Surgeon: Ceasar Mons, MD;  Location: [facility];  Service: Urology;  Laterality: N/A;  . CYSTOSCOPY WITH RETROGRADE PYELOGRAM, URETEROSCOPY AND STENT PLACEMENT Left 03/18/2016   Procedure: CYSTOSCOPY WITH LEFT   RETROGRADE PYELOGRAM,  AND  POLARIS STENT PLACEMENT;  Surgeon: Carolan Clines, MD;  Location: [facility];  Service: Urology;  Laterality: Left;  . LAPAROTOMY N/A 03/03/2016   Procedure: EXPLORATORY LAPAROTOMY;  Surgeon: Erroll Luna, MD;  Location: [place];  Service: General;  Laterality: N/A;  . LUMBAR LAMINECTOMY  11/11/2012   L4-5  and Resection synovial cyst  . MAXIMUM ACCESS (MAS)POSTERIOR LUMBAR INTERBODY FUSION (PLIF) 1 LEVEL N/A 01/11/2014   Procedure: FOR MAXIMUM ACCESS (MAS) POSTERIOR LUMBAR INTERBODY FUSION Lumbar Five Sacral One;  Surgeon: [name], MD;  Location: [place] [facility];  Service: Neurosurgery;  Laterality: N/A;  FOR MAXIMUM ACCESS (MAS) POSTERIOR LUMBAR INTERBODY FUSION Lumbar Five Sacral One  . PORTACATH PLACEMENT  04/10/2016  . REPAIR CEREBROSPINAL FLUID LEAK POST RESECTION SYNOVIAL CYST  12/21/2012  . REVISION LUMBAR HARDWARE  01/26/2014  . TRANSTHORACIC ECHOCARDIOGRAM  06/17/2016   grade 1 diastolic function , ef 42-59%/  trivial TR  . WISDOM TOOTH EXTRACTION      There were no vitals filed for this visit.  Subjective Assessment - 01/20/18 1123    Subjective  I ended up not having a DVT but instead having cellulitis in my Lt upper thigh so I've been on an antibiotic for that since last Thursday and I haven't finished taking it yet. The weeping fluid has been much improved since then. I had a blood transfusion Monday and they plan on doing another one today as my Hemoglobin had dropped to 6 when I went Monday, but they still gave me treatment. I did get a lift chair on order and it should arrive in about 2 weeks.     Pertinent History  Colon cancer diagnosed in June 2017 with abdominal surgery with colostomy, and chemotherapy now with complex cystic mass in central pelvis with mass effect on veins bilaerally.  she recently has an aspiration of 550 cc of fluid from mass that did not improve lymphedema in legs.  She has been elevating them with little benefit.     Patient  Stated Goals  to get help with decreasing her swelling. " I want to get rid of all this oozing"     Currently in Pain?  Yes    Pain Score  5     Pain Location  Leg    Pain Orientation  Left;Lower                  Outpatient Re[facility]  Lymphedema Life Impact Scale Total Score  35.29 %           [facility] Adult PT Treatment/Exercise - 01/20/18 0001      Manual Therapy   Edema Management  Pt arrived wearing thick stockinettes on bil LE's and reports these have been on since Sunday. Pt had gauze over most reddened area of lateral lower leg and this was extremely painful to get off for pt. Moistened  gauze repetively to help loosen so as not to pull off healthy tissue. Once finlly able to remove all dressing pts leg here was pink and had a few areas that were bleeding, one in at most superior aspect with white tissue showing. Pictures taken, see note. Overall, though stockinettes were damp from drainage, her LE very minimally drainaed during session today when left open to air.    Manual Lymphatic Drainage (MLD)  In Supine with HOB elevated: Short neck, 5 diaphragamtic breaths, then Rt axillary nodes, Rt inguino-axillary anastomosis and Rt upper leg and knee as time allowed, then retraced steps. Did not treat Lt LE today as pt is on antibiotics for cellultits.     Compression Bandaging  At end of session placed 3 Tefla dressings over open, bleeding tissue and ABD pad over pinkened area then wrapped lower leg with Artiflex and placed this stockinette to hold all in place.                   PT Education - 01/20/18 1256    Education provided  Yes    Education Details  Issued wrapping of Telfa and ABD pads to pts daughter for her to get at [employer] store today. Also instructed them on importance of removing dressings daily and to wash leg somehow, for now can place foot in bucket filled with water and witha cup just  run water over leg. Then allow to air dry for awhile before replacing all dressings. And to definitely not leave everything on for days as they did this time to allow for a better healing environment for the compromised tissue. And to also remove dressings if she has soaked through them again.    Person(s) Educated  Patient;Child(ren)    Methods  Explanation;Demonstration    Comprehension  Verbalized understanding;Need further instruction       PT Short Term Goals - 01/01/18 1336      PT SHORT TERM GOAL #1   Title  Pt and husband have a way to manage lymphedema at home with MLD and compression     Time  4    Period  Weeks    Status  New      PT SHORT TERM GOAL #2   Title  Pt will decrease left leg circumference to 28 cm at 10 cm proximal to lateral foot     Baseline  31 cm    Time  4    Period  Weeks    Status  New        PT Long Term Goals - 01/01/18 1337      PT LONG TERM GOAL #1   Title  Pt will report she is able to independently manage lymphedema at home    Time  8    Period  Weeks    Status  New      PT LONG TERM GOAL #2   Title  Pt willi increase her hip flexor strengthto 3+/5 so that she is  able to lift her leg so that she can get in and out of the car by herself     Time  8    Period  Weeks    Status  New      PT LONG TERM GOAL #3   Title  Pt will be able to walk > 100 feet independently without device so that she can manage better at home     Baseline  50 feet with min assist  Time  8    Period  Weeks    Status  New            Plan - 01/20/18 1301    Clinical Impression Statement  Pt reports her drainage has been much improved since starting antibiotics for cellulitis after last visit on Thursday. See flowsheet treatment of Lt LE. Discussed with pt possibilities for future visits as she and husband have recently reported increased difficulty with transportation as husband is becoming overwhelmed, and they would like to pursue home health PT for a  time if able. Also, pt did not bring velcro compression garments so could not assess proper wear but did instruct daughter who is visiting briefly in donning and to try this when pt gets home.     Clinical Impairments Affecting Rehab Potential  Pelvic mass causing venous constriction     PT Frequency  2x / week    PT Duration  8 weeks    PT Treatment/Interventions  ADLs/Self Care Home Management;DME Instruction;Therapeutic exercise;Therapeutic activities;Patient/family education;Manual lymph drainage;Compression bandaging;Manual techniques    PT Next Visit Plan  Assess Lt lower leg drainage and openings, reapply Telfa and ABD. If pt brings velcro compression garments assess independence with these. See what needs to be done to for pt to transition to [place].     Consulted and Agree with Plan of Care  Patient       Patient will benefit from skilled therapeutic intervention in order to improve the following deficits and impairments:  Decreased knowledge of use of DME, Decreased skin integrity, Postural dysfunction, Increased edema, Decreased knowledge of precautions, Decreased strength  Visit Diagnosis: Lymphedema, not elsewhere classified     Problem List Patient Active Problem List   Diagnosis Date Noted  . Goals of care, counseling/discussion 12/30/2017  . Palliative care encounter   . Neoplasm related pain   . Tachycardia 10/27/2017  . Urinary retention 10/27/2017  . Lower urinary tract infectious disease 10/27/2017  . Chemotherapy-induced neutropenia ([place]) 10/27/2017  . UTI (urinary tract infection) 10/27/2017  . Left femoral vein DVT ([place]) 10/27/2017  . Sepsis ([place]) 10/27/2017  . Acute pyelonephritis 10/27/2017  . Chronic pain 10/23/2017  . Acute pulmonary embolism ([place]) 10/23/2017  . Bilateral pulmonary embolism ([place]) 10/22/2017  . Port-A-Cath in place 07/27/2017  . Port catheter in place 04/14/2016  . Iron deficiency anemia 04/14/2016  . Rectal cancer ([place]) 03/20/2016  .  Leukocytosis   . Cellulitis 03/10/2016  . Malnutrition of moderate degree 03/07/2016  . Metastatic colorectal cancer ([place]) 03/03/2016  . Bowel obstruction ([place]) 02/29/2016  . Mass of colon 02/29/2016  . Nausea & vomiting 02/29/2016  . Lumbar radiculopathy 01/26/2014  . Essential thrombocytosis ([facility]) 11/10/2011    Otelia Limes, PTA 01/20/2018, 1:33 PM  [place], [place], [address] Phone: [phone]   Fax:  [phone]  Name: AUDRIS SPEAKER MRN: [id number] Date of Birth: 04-06-1956

## 2018-01-20 NOTE — Patient Instructions (Signed)
Blood Transfusion, Adult, Care After This sheet gives you information about how to care for yourself after your procedure. Your health care provider may also give you more specific instructions. If you have problems or questions, contact your health care provider. What can I expect after the procedure? After your procedure, it is common to have:  Bruising and soreness where the IV tube was inserted.  Headache.  Follow these instructions at home:  Take over-the-counter and prescription medicines only as told by your health care provider.  Return to your normal activities as told by your health care provider.  Follow instructions from your health care provider about how to take care of your IV insertion site. Make sure you: ? Wash your hands with soap and water before you change your bandage (dressing). If soap and water are not available, use hand sanitizer. ? Change your dressing as told by your health care provider.  Check your IV insertion site every day for signs of infection. Check for: ? More redness, swelling, or pain. ? More fluid or blood. ? Warmth. ? Pus or a bad smell. Contact a health care provider if:  You have more redness, swelling, or pain around the IV insertion site.  You have more fluid or blood coming from the IV insertion site.  Your IV insertion site feels warm to the touch.  You have pus or a bad smell coming from the IV insertion site.  Your urine turns pink, red, or brown.  You feel weak after doing your normal activities. Get help right away if:  You have signs of a serious allergic or immune system reaction, including: ? Itchiness. ? Hives. ? Trouble breathing. ? Anxiety. ? Chest or lower back pain. ? Fever, flushing, and chills. ? Rapid pulse. ? Rash. ? Diarrhea. ? Vomiting. ? Dark urine. ? Serious headache. ? Dizziness. ? Stiff neck. ? Yellow coloration of the face or the white parts of the eyes (jaundice). This information is not  intended to replace advice given to you by your health care provider. Make sure you discuss any questions you have with your health care provider. Document Released: 10/06/2014 Document Revised: 05/14/2016 Document Reviewed: 03/31/2016 Elsevier Interactive Patient Education  2018 [employer]  

## 2018-01-21 ENCOUNTER — Other Ambulatory Visit: Payer: Self-pay

## 2018-01-21 ENCOUNTER — Ambulatory Visit: Payer: BLUE CROSS/BLUE SHIELD | Admitting: Physical Therapy

## 2018-01-21 DIAGNOSIS — I890 Lymphedema, not elsewhere classified: Secondary | ICD-10-CM

## 2018-01-21 LAB — TYPE AND SCREEN
ABO/RH(D): A NEG
ANTIBODY SCREEN: NEGATIVE
UNIT DIVISION: 0
Unit division: 0
Unit division: 0
Unit division: 0

## 2018-01-21 LAB — BPAM RBC
BLOOD PRODUCT EXPIRATION DATE: 201905212359
BLOOD PRODUCT EXPIRATION DATE: 201905212359
Blood Product Expiration Date: 201904262359
Blood Product Expiration Date: 201905152359
ISSUE DATE / TIME: 201904241117
ISSUE DATE / TIME: 201904221125
ISSUE DATE / TIME: 201904241447
UNIT TYPE AND RH: 600
Unit Type and Rh: 600
Unit Type and Rh: 600
Unit Type and Rh: 600

## 2018-01-21 NOTE — Therapy (Signed)
[place], [place], [address] Phone: [phone]   Fax:  [phone]  Physical Therapy Treatment  Patient Details  Name: Carolyn Barton MRN: [id number] Date of Birth: May 24, 1956 Referring Provider: Dr. Benay Spice    Encounter Date: 01/21/2018  PT End of Session - 01/21/18 1407    Visit Number  6    Number of Visits  17    Date for PT Re-Evaluation  03/03/18    PT Start Time  1300    PT Stop Time  1358    PT Time Calculation (min)  58 min    Activity Tolerance  Patient tolerated treatment well    Behavior During Therapy  [facility] for tasks assessed/performed       Past Medical History:  Diagnosis Date  . Abdominal wall cellulitis 03/10/2016  . Anxiety   . Ascites 10/27/2017   Small volume noted on CT  . Aspiration of liquid 12/23/2017   pelvic cyst/mass  . Bowel obstruction ([place])   . Chronic back pain    lumbar    . Colostomy in place [facility])   . Depression   . DVT (deep venous thrombosis) ([place]) 10/27/2017   Non occlusive left common femoral vein  . Dyspnea   . 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 GERD (gastroesophageal reflux disease)   . Grade I diastolic dysfunction 91/69/4503   ECHO   . History of chemotherapy last chemo 09-21-17  . History of hiatal hernia   . History of pelvic mass 11/2017   cyst  . History of pulmonary embolism 09/2017   Bilateral  . Hydronephrosis    right  . Hypokalemia    takes potassium  . Iron deficiency anemia 04/14/2016   treated w/ Iron infusions  . Liver metastasis ([place])    secondary to colorectal cancer  . Lower leg edema   . Metastatic colorectal cancer [facility]) dx 03/03/2016--- oncologist-- dr Benay Spice   metastatic colorectal carcinoma--  s/p  exp. lap. for bowel obstruction--  rectal mass, peritoneal carcinomatosis, liver mets---  chemotherapy  . Scoliosis   . Skin rash    on legs seeing wound care specialist  . Spinal headache  12/21/2012  . Tachycardia    persistant since discharged from hospital 06/ 2017 due to anemia and deconditioning;  as of 09-02-2016 per pt no issues w/ heart racing in the past few weeks  . Ureteral obstruction, left   . Wears contact lenses     Past Surgical History:  Procedure Laterality Date  . BIOPSY N/A 03/03/2016   Procedure: BIOPSY OF PERITONEAL NODULE;  Surgeon: Erroll Luna, MD;  Location: [place];  Service: General;  Laterality: N/A;  . COLOSTOMY N/A 03/03/2016   Procedure: DIVERTING SIGMOID COLOSTOMY;  Surgeon: Erroll Luna, MD;  Location: [place];  Service: General;  Laterality: N/A;  . CYSTOSCOPY W/ RETROGRADES Right 10/13/2017   Procedure: CYSTOSCOPY WITH RETROGRADE PYELOGRAM/ STENT PLACEMENT;  Surgeon: Ceasar Mons, MD;  Location: [facility];  Service: Urology;  Laterality: Right;  ONLY NEEDS 30 MIN FOR BOTH PROCEDURES  . CYSTOSCOPY W/ URETERAL STENT PLACEMENT Left 10/09/2016   Procedure: CYSTOSCOPY WITH LEFT STENT REPLACEMENT 64F POLARIS STENT 24CM;  Surgeon: Carolan Clines, MD;  Location: [facility];  Service: Urology;  Laterality: Left;  . CYSTOSCOPY W/ URETERAL STENT PLACEMENT Left 03/16/2017   Procedure: CYSTOSCOPY WITH RETROGRADE PYELOGRAM WITH LEFT  STENT REMOVAL AND REPLACEMENT;  Surgeon: Carolan Clines, MD;  Location: [place];  Service: Urology;  Laterality: Left;  . CYSTOSCOPY W/ URETERAL STENT PLACEMENT Left 10/13/2017   Procedure: CYSTOSCOPY WITH STENT REPLACEMENT;  Surgeon: Ceasar Mons, MD;  Location[facility];  Service: Urology;  Laterality: Left;  . CYSTOSCOPY WITH FULGERATION N/A 10/13/2017   Procedure: CYSTOSCOPY WITH FULGERATION;  Surgeon: Ceasar Mons, MD;  Location: [facility];  Service: Urology;  Laterality: N/A;  . CYSTOSCOPY WITH RETROGRADE PYELOGRAM, URETEROSCOPY AND STENT PLACEMENT Left 03/18/2016   Procedure: CYSTOSCOPY WITH LEFT   RETROGRADE PYELOGRAM,  AND  POLARIS STENT PLACEMENT;  Surgeon: Carolan Clines, MD;  [facility];  Service: Urology;  Laterality: Left;  . LAPAROTOMY N/A 03/03/2016   Procedure: EXPLORATORY LAPAROTOMY;  Surgeon: Erroll Luna, MD;  Location: [place];  Service: General;  Laterality: N/A;  . LUMBAR LAMINECTOMY  11/11/2012   L4-5  and Resection synovial cyst  . MAXIMUM ACCESS (MAS)POSTERIOR LUMBAR INTERBODY FUSION (PLIF) 1 LEVEL N/A 01/11/2014   Procedure: FOR MAXIMUM ACCESS (MAS) POSTERIOR LUMBAR INTERBODY FUSION Lumbar Five Sacral One;  Surgeon: [name], MD;  Location: [place] [facility];  Service: Neurosurgery;  Laterality: N/A;  FOR MAXIMUM ACCESS (MAS) POSTERIOR LUMBAR INTERBODY FUSION Lumbar Five Sacral One  . PORTACATH PLACEMENT  04/10/2016  . REPAIR CEREBROSPINAL FLUID LEAK POST RESECTION SYNOVIAL CYST  12/21/2012  . REVISION LUMBAR HARDWARE  01/26/2014  . TRANSTHORACIC ECHOCARDIOGRAM  06/17/2016   grade 1 diastolic function , ef 16-10%/  trivial TR  . WISDOM TOOTH EXTRACTION      There were no vitals filed for this visit.  Subjective Assessment - 01/21/18 1303    Subjective  She didn't put anything on this right leg yesterday and I think it's doing better.  It feels better. Left leg feels better today too.    Pertinent History  Colon cancer diagnosed in June 2017 with abdominal surgery with colostomy, and chemotherapy now with complex cystic mass in central pelvis with mass effect on veins bilaerally.  she recently has an aspiration of 550 cc of fluid from mass that did not improve lymphedema in legs.  She has been elevating them with little benefit.     Patient Stated Goals  to get help with decreasing her swelling. " I want to get rid of all this oozing"     Currently in Pain?  No/denies         [facility] PT Assessment - 01/21/18 0001      Assessment   Medical Diagnosis           LYMPHEDEMA/ONCOLOGY QUESTIONNAIRE - 01/21/18 1333      Left Lower Extremity Lymphedema   10 cm  Proximal to Suprapatella  50.4 cm    10 cm Proximal to Floor at Lateral Malleoli  26.3 cm    Other  measured in supine           Outpatient Rehab from 01/01/2018 in [place]  Lymphedema Life Impact Scale Total Score  35.29 %           [facility] Adult PT Treatment/Exercise - 01/21/18 0001      Self-Care   Other Self-Care Comments   Instructed patient and her husband, who was with her today, to remove bandages any time if they soak through, but otherwise to remove them tomorrow and each day, wash, and reapply bandages and garments.      Manual Therapy   Edema Management  Bandages from yesterday had exudate on  them, but were not soaking wet. Wounds looked similar to yesterday. Gently washed patient's left leg with gentle debridement of dead skin. Left skin to air dry while other things were done today. a couple of circumference measurements taken of left leg    Manual Lymphatic Drainage (MLD)  In Supine with HOB elevated: Short neck, 5 diaphragamtic breaths, then Rt axillary nodes, Rt inguino-axillary anastomosis and Rt upper leg, knee, lower leg and dorsal foot, then retraced steps. Same to left LE but to knee only because of open wounds.     Compression Bandaging  At end of session placed 3 Tefla dressings over open, bleeding tissue and ABD pad over pinkened area of left leg, then TG soft, then 1/2 roll of Artiflex for cushioning, and Circaid reduction kit on left lower leg.  Applied lotion to right lower leg and foot, then TG soft and Circaid reduction kit to right lower leg.  Left leg reduction kit was very gently snug, right just a little bit snugger. Pt. reported both felt good at end of session.             PT Education - 01/21/18 1407    Education provided  Yes    Education Details  to remove wraps and bandages daily, to wash left leg, and to reapply them as done today.    Person(s) Educated  Patient;Spouse    Methods   Explanation;Demonstration    Comprehension  Verbalized understanding       PT Short Term Goals - 01/21/18 1412      PT SHORT TERM GOAL #1   Title  Pt and husband have a way to manage lymphedema at home with MLD and compression     Status  On-going      PT SHORT TERM GOAL #2   Title  Pt will decrease left leg circumference to 28 cm at 10 cm proximal to lateral foot     Status  Achieved        PT Long Term Goals - 01/01/18 1337      PT LONG TERM GOAL #1   Title  Pt will report she is able to independently manage lymphedema at home    Time  8    Period  Weeks    Status  New      PT LONG TERM GOAL #2   Title  Pt willi increase her hip flexor strengthto 3+/5 so that she is  able to lift her leg so that she can get in and out of the car by herself     Time  8    Period  Weeks    Status  New      PT LONG TERM GOAL #3   Title  Pt will be able to walk > 100 feet independently without device so that she can manage better at home     Baseline  50 feet with min assist     Time  8    Period  Weeks    Status  New            Plan - 01/21/18 1407    Clinical Impression Statement  Patient's left leg wounds had seeped some but not a lot since yesterday, and looked about the same today as they did yesterday. Circumference measurements at two levels of left left showed nice reductions, and patient and her husband could see reductions on both legs. Pt. brought velcro wraps today so these were used; pt. and  her husband understood instruction to change bandages daily. Informed them about plan being put in place for pt. to get home health. Pt. says she is to have aspiration of tumor attempted next Wednesday by interventional radiology.    Clinical Impairments Affecting Rehab Potential  Pelvic mass causing venous constriction     PT Frequency  2x / week    PT Duration  8 weeks    PT Treatment/Interventions  ADLs/Self Care Home Management;DME Instruction;Therapeutic exercise;Therapeutic  activities;Patient/family education;Manual lymph drainage;Compression bandaging;Manual techniques    PT Next Visit Plan  Pt. was to schedule two appointments next week here, but will cancel if home health therapy is set up and begins sooner. Assess whether pt./family changed bandages daily. Assess Lt lower leg drainage and openings, reapply Telfa and ABD. If pt brings velcro compression garments assess independence with these.     Consulted and Agree with Plan of Care  Patient;Family member/caregiver husband       Patient will benefit from skilled therapeutic intervention in order to improve the following deficits and impairments:  Decreased knowledge of use of DME, Decreased skin integrity, Postural dysfunction, Increased edema, Decreased knowledge of precautions, Decreased strength  Visit Diagnosis: Lymphedema, not elsewhere classified     Problem List Patient Active Problem List   Diagnosis Date Noted  . Goals of care, counseling/discussion 12/30/2017  . Palliative care encounter   . Neoplasm related pain   . Tachycardia 10/27/2017  . Urinary retention 10/27/2017  . Lower urinary tract infectious disease 10/27/2017  . Chemotherapy-induced neutropenia ([place]) 10/27/2017  . UTI (urinary tract infection) 10/27/2017  . Left femoral vein DVT ([place]) 10/27/2017  . Sepsis ([place]) 10/27/2017  . Acute pyelonephritis 10/27/2017  . Chronic pain 10/23/2017  . Acute pulmonary embolism ([place]) 10/23/2017  . Bilateral pulmonary embolism ([place]) 10/22/2017  . Port-A-Cath in place 07/27/2017  . Port catheter in place 04/14/2016  . Iron deficiency anemia 04/14/2016  . Rectal cancer ([place]) 03/20/2016  . Leukocytosis   . Cellulitis 03/10/2016  . Malnutrition of moderate degree 03/07/2016  . Metastatic colorectal cancer ([place]) 03/03/2016  . Bowel obstruction ([place]) 02/29/2016  . Mass of colon 02/29/2016  . Nausea & vomiting 02/29/2016  . Lumbar radiculopathy 01/26/2014  . Essential thrombocytosis ([place])  11/10/2011    Elainah Rhyne 01/21/2018, 2:13 PM  [place], [place], [address] Phone: [phone]   Fax:  [phone]  Name: ASLEY [place] MRN: [id number] Date of Birth: [birth year]  Serafina Royals, PT 01/21/18 2:13 PM

## 2018-01-22 ENCOUNTER — Other Ambulatory Visit: Payer: Self-pay | Admitting: Radiology

## 2018-01-22 ENCOUNTER — Telehealth: Payer: Self-pay | Admitting: *Deleted

## 2018-01-22 ENCOUNTER — Encounter: Payer: BLUE CROSS/BLUE SHIELD | Admitting: Physical Therapy

## 2018-01-22 NOTE — Telephone Encounter (Signed)
Voicemail received requesting "I'm for a procedure on Tuesday.  I honestly don't remember when to take the last dose of my medication.  Call me [phone]  to let me know when."  Message forwarded to collaborative.  Further patient communication from collaborative nurse.

## 2018-01-22 NOTE — Telephone Encounter (Signed)
Called patient informed her per Dr. Benay Spice do not take Xarelto on Sunday 4/28, Monday 4/29 and Tuesday 4/30, resume on Wednesday 01/27/18 after procedure.  Patient verbalized an understanding.

## 2018-01-25 ENCOUNTER — Other Ambulatory Visit: Payer: Self-pay | Admitting: Student

## 2018-01-25 ENCOUNTER — Ambulatory Visit: Payer: BLUE CROSS/BLUE SHIELD

## 2018-01-25 DIAGNOSIS — I890 Lymphedema, not elsewhere classified: Secondary | ICD-10-CM

## 2018-01-25 NOTE — Therapy (Signed)
[place], [place], [address] Phone: [phone]   Fax:  [phone]  Physical Therapy Treatment  Patient Details  Name: Carolyn Barton MRN: [id number] Date of Birth: [birth year] Referring Provider: Dr. Benay Spice    Encounter Date: 01/25/2018  PT End of Session - 01/25/18 1206    Visit Number  7    Number of Visits  17    Date for PT Re-Evaluation  03/03/18    PT Start Time  1104    PT Stop Time  1150    PT Time Calculation (min)  46 min    Activity Tolerance  Patient tolerated treatment well    Behavior During Therapy  [facility] for tasks assessed/performed       Past Medical History:  Diagnosis Date  . Abdominal wall cellulitis 03/10/2016  . Anxiety   . Ascites 10/27/2017   Small volume noted on CT  . Aspiration of liquid 12/23/2017   pelvic cyst/mass  . Bowel obstruction ([place])   . Chronic back pain    lumbar    . Colostomy in place [facility])   . Depression   . DVT (deep venous thrombosis) ([place]) 10/27/2017   Non occlusive left common femoral vein  . Dyspnea   . 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 GERD (gastroesophageal reflux disease)   . Grade I diastolic dysfunction 32/95/1884   ECHO   . History of chemotherapy last chemo 09-21-17  . History of hiatal hernia   . History of pelvic mass 11/2017   cyst  . History of pulmonary embolism 09/2017   Bilateral  . Hydronephrosis    right  . Hypokalemia    takes potassium  . Iron deficiency anemia 04/14/2016   treated w/ Iron infusions  . Liver metastasis ([place])    secondary to colorectal cancer  . Lower leg edema   . Metastatic colorectal cancer [facility]) dx 03/03/2016--- oncologist-- dr Benay Spice   metastatic colorectal carcinoma--  s/p  exp. lap. for bowel obstruction--  rectal mass, peritoneal carcinomatosis, liver mets---  chemotherapy  . Scoliosis   . Skin rash    on legs seeing wound care specialist  . Spinal headache  12/21/2012  . Tachycardia    persistant since discharged from hospital 06/ 2017 due to anemia and deconditioning;  as of 09-02-2016 per pt no issues w/ heart racing in the past few weeks  . Ureteral obstruction, left   . Wears contact lenses     Past Surgical History:  Procedure Laterality Date  . BIOPSY N/A 03/03/2016   Procedure: BIOPSY OF PERITONEAL NODULE;  Surgeon: Erroll Luna, MD;  Location: [place];  Service: General;  Laterality: N/A;  . COLOSTOMY N/A 03/03/2016   Procedure: DIVERTING SIGMOID COLOSTOMY;  Surgeon: Erroll Luna, MD;  Location: [place];  Service: General;  Laterality: N/A;  . CYSTOSCOPY W/ RETROGRADES Right 10/13/2017   Procedure: CYSTOSCOPY WITH RETROGRADE PYELOGRAM/ STENT PLACEMENT;  Surgeon: Ceasar Mons, MD;  Location: [facility];  Service: Urology;  Laterality: Right;  ONLY NEEDS 30 MIN FOR BOTH PROCEDURES  . CYSTOSCOPY W/ URETERAL STENT PLACEMENT Left 10/09/2016   Procedure: CYSTOSCOPY WITH LEFT STENT REPLACEMENT 79F POLARIS STENT 24CM;  Surgeon: Carolan Clines, MD;  Location: [facility];  Service: Urology;  Laterality: Left;  . CYSTOSCOPY W/ URETERAL STENT PLACEMENT Left 03/16/2017   Procedure: CYSTOSCOPY WITH RETROGRADE PYELOGRAM WITH LEFT  STENT REMOVAL AND REPLACEMENT;  Surgeon: Carolan Clines, MD;  Location: [place];  Service: Urology;  Laterality: Left;  . CYSTOSCOPY W/ URETERAL STENT PLACEMENT Left 10/13/2017   Procedure: CYSTOSCOPY WITH STENT REPLACEMENT;  Surgeon: Ceasar Mons, MD;  Location: [facility];  Service: Urology;  Laterality: Left;  . CYSTOSCOPY WITH FULGERATION N/A 10/13/2017   Procedure: CYSTOSCOPY WITH FULGERATION;  Surgeon: Ceasar Mons, MD;  Location: [facility];  Service: Urology;  Laterality: N/A;  . CYSTOSCOPY WITH RETROGRADE PYELOGRAM, URETEROSCOPY AND STENT PLACEMENT Left 03/18/2016   Procedure: CYSTOSCOPY WITH LEFT   RETROGRADE PYELOGRAM,  AND  POLARIS STENT PLACEMENT;  Surgeon: Carolan Clines, MD;  [facility];  Service: Urology;  Laterality: Left;  . LAPAROTOMY N/A 03/03/2016   Procedure: EXPLORATORY LAPAROTOMY;  Surgeon: Erroll Luna, MD;  Location: [place];  Service: General;  Laterality: N/A;  . LUMBAR LAMINECTOMY  11/11/2012   L4-5  and Resection synovial cyst  . MAXIMUM ACCESS (MAS)POSTERIOR LUMBAR INTERBODY FUSION (PLIF) 1 LEVEL N/A 01/11/2014   Procedure: FOR MAXIMUM ACCESS (MAS) POSTERIOR LUMBAR INTERBODY FUSION Lumbar Five Sacral One;  Surgeon: [name], MD;  Location: [place] [facility];  Service: Neurosurgery;  Laterality: N/A;  FOR MAXIMUM ACCESS (MAS) POSTERIOR LUMBAR INTERBODY FUSION Lumbar Five Sacral One  . PORTACATH PLACEMENT  04/10/2016  . REPAIR CEREBROSPINAL FLUID LEAK POST RESECTION SYNOVIAL CYST  12/21/2012  . REVISION LUMBAR HARDWARE  01/26/2014  . TRANSTHORACIC ECHOCARDIOGRAM  06/17/2016   grade 1 diastolic function , ef 29-79%/  trivial TR  . WISDOM TOOTH EXTRACTION      There were no vitals filed for this visit.  Subjective Assessment - 01/25/18 1111    Subjective  We changed the bandages every day and that went pretty good. The drainage has definitely improved and my husband has been putting the velcro compression garments on me daily as well to the Lt LE. Haven't worn anything on the Rt since last week.     Pertinent History  Colon cancer diagnosed in June 2017 with abdominal surgery with colostomy, and chemotherapy now with complex cystic mass in central pelvis with mass effect on veins bilaerally.  she recently has an aspiration of 550 cc of fluid from mass that did not improve lymphedema in legs.  She has been elevating them with little benefit.     Patient Stated Goals  to get help with decreasing her swelling. " I want to get rid of all this oozing"     Currently in Pain?  No/denies                  Outpatient [facility] from [facility]/01/[facility] [facility]  Lymphedema Life Impact Scale Total Score  35.29 %           [facility] Adult PT Treatment/Exercise - 01/25/18 0001      Manual Therapy   Manual Lymphatic Drainage (MLD)  In Supine with HOB elevated: Short neck, 5 diaphragamtic breaths, then Rt axillary nodes, Rt inguino-axillary anastomosis and Rt upper leg, knee, lower leg and dorsal foot, then retraced steps.    Compression Bandaging  At end of session placed 3 Tefla dressings over open, bleeding tissue and ABD pad over pinkened area of left leg, then Artiflex to hold inplace and cushion, then TG soft. Could not reapply pts Circaid reduction garment as this was soiled from draiange but husband reports will put other one as soon as they get home (did not bring it with them) and will wash soiled one.  Pt. reported Lt LE felt good at end of session. Requested of pt and husband to please bring back one of the washed stockinettes to next appt with garment and they verbalized understanding.               PT Short Term Goals - 01/21/18 1412      PT SHORT TERM GOAL #1   Title  Pt and husband have a way to manage lymphedema at home with MLD and compression     Status  On-going      PT SHORT TERM GOAL #2   Title  Pt will decrease left leg circumference to 28 cm at 10 cm proximal to lateral foot     Status  Achieved        PT Long Term Goals - 01/01/18 1337      PT LONG TERM GOAL #1   Title  Pt will report she is able to independently manage lymphedema at home    Time  8    Period  Weeks    Status  New      PT LONG TERM GOAL #2   Title  Pt willi increase her hip flexor strengthto 3+/5 so that she is  able to lift her leg so that she can get in and out of the car by herself     Time  8    Period  Weeks    Status  New      PT LONG TERM GOAL #3   Title  Pt will be able to walk > 100 feet independently without device so that she can manage better at home     Baseline  50 feet with min assist     Time   8    Period  Weeks    Status  New            Plan - 01/25/18 1207    Clinical Impression Statement  Pt and husband report they have changed the dressings each day and her drainage is much improved over past week since the week before. She did not have and ABD pad over the Tefla today so her stockinette and Garment were soiled since dressing change last night. Pt reports daughter went and bought all suggested dressing last week but they could have missed it in the home so will look for ABD pads and get them if not. Pt still with open wounds on Lt lower leg and increased sensitivity. Gently washed around pinkened adge and ran water over open wounds then allowed to air dry while performing manual lymph drainage to Rt LE. Pt and husband hope to here from home health agency this week. They have another appt for Wednesday though if they haven't heard from agency by then.     Clinical Impairments Affecting Rehab Potential  Pelvic mass causing venous constriction     PT Frequency  2x / week    PT Duration  8 weeks    PT Treatment/Interventions  ADLs/Self Care Home Management;DME Instruction;Therapeutic exercise;Therapeutic activities;Patient/family education;Manual lymph drainage;Compression bandaging;Manual techniques    PT Next Visit Plan  Pt. has one more appointment scheduled for this week here, but will cancel if home health therapy is set up and begins sooner. Assess whether pt./family changed bandages daily. Reapply Telfa and ABD. If pt brings velcro compression garments assess independence with these and frequencey of wear.    Consulted and Agree with Plan of Care  Patient;Family member/caregiver    Family  Member Consulted  Husband       Patient will benefit from skilled therapeutic intervention in order to improve the following deficits and impairments:  Decreased knowledge of use of DME, Decreased skin integrity, Postural dysfunction, Increased edema, Decreased knowledge of precautions,  Decreased strength  Visit Diagnosis: Lymphedema, not elsewhere classified     Problem List Patient Active Problem List   Diagnosis Date Noted  . Goals of care, counseling/discussion 12/30/2017  . Palliative care encounter   . Neoplasm related pain   . Tachycardia 10/27/2017  . Urinary retention 10/27/2017  . Lower urinary tract infectious disease 10/27/2017  . Chemotherapy-induced neutropenia ([place]) 10/27/2017  . UTI (urinary tract infection) 10/27/2017  . Left femoral vein DVT ([place]) 10/27/2017  . Sepsis ([place]) 10/27/2017  . Acute pyelonephritis 10/27/2017  . Chronic pain 10/23/2017  . Acute pulmonary embolism ([place]) 10/23/2017  . Bilateral pulmonary embolism ([place]) 10/22/2017  . Port-A-Cath in place 07/27/2017  . Port catheter in place 04/14/2016  . Iron deficiency anemia 04/14/2016  . Rectal cancer ([place]) 03/20/2016  . Leukocytosis   . Cellulitis 03/10/2016  . Malnutrition of moderate degree 03/07/2016  . Metastatic colorectal cancer ([place]) 03/03/2016  . Bowel obstruction ([place]) 02/29/2016  . Mass of colon 02/29/2016  . Nausea & vomiting 02/29/2016  . Lumbar radiculopathy 01/26/2014  . Essential thrombocytosis ([place]) 11/10/2011    Otelia Limes, PTA 01/25/2018, 12:16 PM  [place], [place], [address] Phone: [phone]   Fax:  [phone]  Name: Carolyn Barton MRN: [id number] Date of Birth: [birth year]

## 2018-01-26 ENCOUNTER — Ambulatory Visit (HOSPITAL_COMMUNITY)
Admission: RE | Admit: 2018-01-26 | Discharge: 2018-01-26 | Disposition: A | Discharge status: Home / Self Care | Payer: BLUE CROSS/BLUE SHIELD | Source: Ambulatory Visit | Attending: Oncology | Admitting: Oncology

## 2018-01-26 ENCOUNTER — Encounter (HOSPITAL_COMMUNITY): Payer: Self-pay

## 2018-01-26 DIAGNOSIS — C20 Malignant neoplasm of rectum: Secondary | ICD-10-CM | POA: Diagnosis present

## 2018-01-26 LAB — CBC
HCT: 27.2 % — ABNORMAL LOW (ref 36.0–46.0)
Hemoglobin: 8.4 g/dL — ABNORMAL LOW (ref 12.0–15.0)
MCH: 24.9 pg — ABNORMAL LOW (ref 26.0–34.0)
MCHC: 30.9 g/dL (ref 30.0–36.0)
MCV: 80.7 fL (ref 78.0–100.0)
PLATELETS: 460 10*3/uL — AB (ref 150–400)
RBC: 3.37 MIL/uL — ABNORMAL LOW (ref 3.87–5.11)
RDW: 20.1 % — AB (ref 11.5–15.5)
WBC: 7 10*3/uL (ref 4.0–10.5)

## 2018-01-26 LAB — PROTIME-INR
INR: 1.03
PROTHROMBIN TIME: 13.4 s (ref 11.4–15.2)

## 2018-01-26 LAB — APTT: APTT: 29 s (ref 24–36)

## 2018-01-26 MED ORDER — HYDROCODONE-ACETAMINOPHEN 5-325 MG PO TABS: 1.0000 | ORAL_TABLET | ORAL | Status: DC | PRN

## 2018-01-26 MED ORDER — SODIUM CHLORIDE 0.9 % IV SOLN
INTRAVENOUS | Status: AC | PRN
  Administered 2018-01-26: 10 mL/h via INTRAVENOUS

## 2018-01-26 MED ORDER — MIDAZOLAM HCL 2 MG/2ML IJ SOLN
INTRAMUSCULAR | Status: AC
  Filled 2018-01-26: qty 2

## 2018-01-26 MED ORDER — FENTANYL CITRATE (PF) 100 MCG/2ML IJ SOLN
INTRAMUSCULAR | Status: AC | PRN
  Administered 2018-01-26: 25 ug via INTRAVENOUS
  Administered 2018-01-26: 50 ug via INTRAVENOUS

## 2018-01-26 MED ORDER — LIDOCAINE HCL 1 % IJ SOLN
INTRAMUSCULAR | Status: AC
  Filled 2018-01-26: qty 20

## 2018-01-26 MED ORDER — FENTANYL CITRATE (PF) 100 MCG/2ML IJ SOLN
INTRAMUSCULAR | Status: AC
  Filled 2018-01-26: qty 2

## 2018-01-26 MED ORDER — MIDAZOLAM HCL 2 MG/2ML IJ SOLN
INTRAMUSCULAR | Status: AC | PRN
  Administered 2018-01-26 (×2): 1 mg via INTRAVENOUS

## 2018-01-26 NOTE — Sedation Documentation (Signed)
Patient is resting comfortably. 

## 2018-01-26 NOTE — Discharge Instructions (Signed)
Needle Aspiration, Care After Refer to this sheet in the next few weeks. These instructions provide you with information about caring for yourself after your procedure. Your health care provider may also give you more specific instructions. Your treatment has been planned according to current medical practices, but problems sometimes occur. Call your health care provider if you have any problems or questions after your procedure. What can I expect after the procedure? After your procedure, it is common to have soreness, bruising, or mild pain at the biopsy site. This should go away in a few days. Follow these instructions at home:  Rest as directed by your health care provider.  Take medicines only as directed by your health care provider.  There are many different ways to close and cover the biopsy site, including stitches (sutures), skin glue, and adhesive strips. Follow your health care provider's instructions about: ? Biopsy site care. ? Bandage (dressing) changes and removal. ? Biopsy site closure removal.  Check your biopsy site every day for signs of infection. Watch for: ? Redness, swelling, or pain. ? Fluid, blood, or pus. Contact a health care provider if:  You have a fever.  You have redness, swelling, or pain at the biopsy site that lasts longer than a few days.  You have fluid, blood, or pus coming from the biopsy site.  You feel nauseous.  You vomit. Get help right away if:  You have shortness of breath.  You have trouble breathing.  You have chest pain.  You feel dizzy or you faint.  You have bleeding that does not stop with pressure or a bandage.  You cough up blood.  You have pain in your abdomen. This information is not intended to replace advice given to you by your health care provider. Make sure you discuss any questions you have with your health care provider. Document Released: 01/30/2015 Document Revised: 02/21/2016 Document Reviewed:  09/11/2014 Elsevier Interactive Patient Education  [employer].

## 2018-01-26 NOTE — H&P (Addendum)
Chief Complaint: Patient was seen in consultation today for pelvic cyst aspiration/possible sclerosing at the request of Sherrill,Gary B  Referring Physician(s): Ladell Pier  Supervising Physician: Arne Cleveland  Patient Status: [facility] - Out-pt  History of Present Illness: Carolyn Barton is a 62 y.o. 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 Abdominal wall cellulitis 03/10/2016  . Anxiety   . Ascites 10/27/2017   Small volume noted on CT  . Aspiration of liquid 12/23/2017   pelvic cyst/mass  . Bowel obstruction ([place])   . Chronic back pain    lumbar    . Colostomy in place [facility])   . Depression   . DVT (deep venous thrombosis) ([place]) 10/27/2017   Non occlusive left common femoral vein  . Dyspnea   . 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 GERD (gastroesophageal reflux disease)   . Grade I diastolic dysfunction 02/58/5277   ECHO   . History of chemotherapy last chemo 09-21-17  . History of hiatal hernia   . History of pelvic mass 11/2017   cyst  . History of pulmonary embolism 09/2017   Bilateral  . Hydronephrosis    right  . Hypokalemia    takes potassium  . Iron deficiency anemia 04/14/2016   treated w/ Iron infusions  . Liver metastasis ([place])    secondary to colorectal cancer  . Lower leg edema   . Metastatic colorectal cancer [facility]) dx 03/03/2016--- oncologist-- dr Benay Spice   metastatic colorectal carcinoma--  s/p  exp. lap. for bowel obstruction--  rectal mass, peritoneal carcinomatosis, liver mets---  chemotherapy  . Scoliosis   . Skin rash    on legs seeing wound care specialist  . Spinal headache 12/21/2012  . Tachycardia    persistant since  discharged from hospital 06/ 2017 due to anemia and deconditioning;  as of 09-02-2016 per pt no issues w/ heart racing in the past few weeks  . Ureteral obstruction, left   . Wears contact lenses     Past Surgical History:  Procedure Laterality Date  . BIOPSY N/A 03/03/2016   Procedure: BIOPSY OF PERITONEAL NODULE;  Surgeon: Erroll Luna, MD;  Location: [place];  Service: General;  Laterality: N/A;  . COLOSTOMY N/A 03/03/2016   Procedure: DIVERTING SIGMOID COLOSTOMY;  Surgeon: Erroll Luna, MD;  Location: [place];  Service: General;  Laterality: N/A;  . CYSTOSCOPY W/ RETROGRADES Right 10/13/2017   Procedure: CYSTOSCOPY WITH RETROGRADE PYELOGRAM/ STENT PLACEMENT;  Surgeon: Ceasar Mons, MD;  Location: [facility];  Service: Urology;  Laterality: Right;  ONLY NEEDS 30 MIN FOR BOTH PROCEDURES  . CYSTOSCOPY W/ URETERAL STENT PLACEMENT Left 10/09/2016   Procedure: CYSTOSCOPY WITH LEFT STENT REPLACEMENT 38F POLARIS STENT 24CM;  Surgeon: Carolan Clines, MD;  Location: [facility];  Service: Urology;  Laterality: Left;  . CYSTOSCOPY W/ URETERAL STENT PLACEMENT Left 03/16/2017   Procedure: CYSTOSCOPY WITH RETROGRADE PYELOGRAM WITH LEFT  STENT REMOVAL AND REPLACEMENT;  Surgeon: Carolan Clines, MD;  Location: [facility];  Service: Urology;  Laterality: Left;  . CYSTOSCOPY W/ URETERAL STENT PLACEMENT Left 10/13/2017   Procedure: CYSTOSCOPY WITH STENT REPLACEMENT;  Surgeon: Lovena Neighbours,  Conception Oms, MD;  Location: [facility];  Service: Urology;  Laterality: Left;  . CYSTOSCOPY WITH FULGERATION N/A 10/13/2017   Procedure: CYSTOSCOPY WITH FULGERATION;  Surgeon: Ceasar Mons, MD;  Location: [facility];  Service: Urology;  Laterality: N/A;  . CYSTOSCOPY WITH RETROGRADE PYELOGRAM, URETEROSCOPY AND STENT PLACEMENT Left 03/18/2016   Procedure: CYSTOSCOPY WITH LEFT  RETROGRADE PYELOGRAM,  AND  POLARIS STENT  PLACEMENT;  Surgeon: Carolan Clines, MD;  Location: [facility];  Service: Urology;  Laterality: Left;  . LAPAROTOMY N/A 03/03/2016   Procedure: EXPLORATORY LAPAROTOMY;  Surgeon: Erroll Luna, MD;  Location: [place];  Service: General;  Laterality: N/A;  . LUMBAR LAMINECTOMY  11/11/2012   L4-5  and Resection synovial cyst  . MAXIMUM ACCESS (MAS)POSTERIOR LUMBAR INTERBODY FUSION (PLIF) 1 LEVEL N/A 01/11/2014   Procedure: FOR MAXIMUM ACCESS (MAS) POSTERIOR LUMBAR INTERBODY FUSION Lumbar Five Sacral One;  Surgeon: [name], MD;  Location: [place] [facility];  Service: Neurosurgery;  Laterality: N/A;  FOR MAXIMUM ACCESS (MAS) POSTERIOR LUMBAR INTERBODY FUSION Lumbar Five Sacral One  . PORTACATH PLACEMENT  04/10/2016  . REPAIR CEREBROSPINAL FLUID LEAK POST RESECTION SYNOVIAL CYST  12/21/2012  . REVISION LUMBAR HARDWARE  01/26/2014  . TRANSTHORACIC ECHOCARDIOGRAM  06/17/2016   grade 1 diastolic function , ef 12-45%/  trivial TR  . WISDOM TOOTH EXTRACTION      Allergies: Patient has no known allergies.  Medications: Prior to Admission medications   Medication Sig Start Date End Date Taking? Authorizing Provider  cephALEXin (KEFLEX) 500 MG capsule Take 1 capsule (500 mg total) by mouth 4 (four) times daily. 01/14/18  Yes Tanner, Lyndon Code., PA-C  DULoxetine (CYMBALTA) 60 MG capsule Take 1 capsule (60 mg total) by mouth every evening. 06/15/17  Yes Ladell Pier, MD  ferrous sulfate 325 (65 FE) MG EC tablet Take 1 tablet (325 mg total) by mouth daily. 11/17/16  Yes Ladell Pier, MD  lidocaine-prilocaine (EMLA) cream Apply to port site one hour prior to use. Do not rub in. Cover with plastic. Patient taking differently: Apply 1 application topically daily as needed (prior to port site being accessed.). Apply to port site one hour prior to use. Do not rub in. Cover with plastic. 03/02/17  Yes Owens Shark, NP  methadone (DOLOPHINE) 10 MG tablet Take 10 mg by mouth 3 (three) times daily.    Yes [provider]  morphine (MSIR) 15 MG tablet Take 1-2 tablets (15-30 mg total) by mouth every 4 (four) hours as needed for severe pain. 11/01/17  Yes Georgette Shell, MD  omeprazole (PRILOSEC) 20 MG capsule Take 1 capsule (20 mg total) by mouth daily. 10/21/17  Yes Ladell Pier, MD  ondansetron (ZOFRAN) 8 MG tablet Take 1 tablet (8 mg total) by mouth every 8 (eight) hours as needed for nausea or vomiting. 12/23/17  Yes Ladell Pier, MD  polyethylene glycol (MIRALAX / GLYCOLAX) packet Take 17 g by mouth daily as needed for mild constipation.    Yes [provider]  potassium chloride (K-DUR) 10 MEQ tablet Take 1 tablet (10 mEq total) by mouth 2 (two) times daily. 09/21/17  Yes Ladell Pier, MD  prochlorperazine (COMPAZINE) 10 MG tablet Take 1 tablet (10 mg total) by mouth every 6 (six) hours as needed for nausea or vomiting. 12/23/17  Yes Ladell Pier, MD  rivaroxaban (XARELTO) 20 MG TABS tablet Take 1 tablet (20 mg total) by mouth daily with supper. 11/24/17  Yes Ladell Pier, MD  furosemide (LASIX) 20 MG tablet Take 1 tablet (20 mg total) by mouth daily. Patient not taking: Reported on 01/22/2018 11/24/17   Ladell Pier, MD  sulfamethoxazole-trimethoprim (BACTRIM DS,SEPTRA DS) 800-160 MG tablet Take 1 tablet by mouth 2 (two) times daily. Patient not taking: Reported on 01/14/2018 12/16/17   Owens Shark, NP     Family History  Problem Relation Age of Onset  . Heart disease Mother   . Melanoma Mother   . Colon cancer Neg Hx   . Colon polyps Neg Hx   . Rectal cancer Neg Hx   . Stomach cancer Neg Hx   . Esophageal cancer Neg Hx     Social History   Socioeconomic History  . Marital status: Married    Spouse name: Purcell Nails  . Number of children: 2  . Years of education: Not on file  . Highest education level: Not on file  Occupational History  . Not on file  Social Needs  . Financial resource strain: Not on file  . Food insecurity:    Worry: Not on file     Inability: Not on file  . Transportation needs:    Medical: Not on file    Non-medical: Not on file  Tobacco Use  . Smoking status: Former Smoker    Packs/day: 0.25    Years: 1.00    Pack years: 0.25    Types: Cigarettes    Last attempt to quit: 10/03/1978    Years since quitting: 39.3  . Smokeless tobacco: Never Used  Substance and Sexual Activity  . Alcohol use: No    Alcohol/week: 0.0 oz  . Drug use: No  . Sexual activity: Yes    Birth control/protection: Post-menopausal  Lifestyle  . Physical activity:    Days per week: Not on file    Minutes per session: Not on file  . Stress: Not on file  Relationships  . Social connections:    Talks on phone: Not on file    Gets together: Not on file    Attends religious service: Not on file    Active member of club or organization: Not on file    Attends meetings of clubs or organizations: Not on file    Relationship status: Not on file  Other Topics Concern  . Not on file  Social History Narrative   03/20/16: Married, husband Purcell Nails for 33+ years   #2 children-grown    Review of Systems: A 12 point ROS discussed and pertinent positives are indicated in the HPI above.  All other systems are negative.  Review of Systems  Constitutional: Positive for activity change and fatigue. Negative for fever.  Respiratory: Negative for shortness of breath.   Cardiovascular: Negative for chest pain.  Gastrointestinal: Negative for abdominal pain.  Musculoskeletal: Positive for back pain and gait problem.  Neurological: Positive for weakness.  Psychiatric/Behavioral: Negative for behavioral problems and confusion.    Vital Signs: BP 138/88   Pulse 94   Temp 98.2 F (36.8 C)   Resp 18   Ht 5\' 5"  (1.651 m)   Wt 146 lb (66.2 kg)   SpO2 100%   BMI 24.30 kg/m   Physical Exam  Constitutional: She is oriented to person, place, and time.  Cardiovascular: Normal rate and regular rhythm.  Pulmonary/Chest: Effort normal and breath  sounds normal.  Abdominal: Soft. Bowel sounds are normal.  Musculoskeletal: Normal range of motion. She exhibits edema and tenderness.  Neurological:  She is alert and oriented to person, place, and time.  Skin: Skin is warm and dry.  Psychiatric: She has a normal mood and affect. Her behavior is normal. Judgment and thought content normal.  Nursing note and vitals reviewed.   Imaging: No results found.  Labs:  CBC: Recent Labs    12/21/17 0816 12/23/17 0719 01/04/18 0847 01/18/18 0852  WBC 7.5 4.3 4.6 7.0  HGB 9.0* 9.3* 8.2* 6.4*  HCT 28.2* 29.2* 25.8* 21.0*  PLT 576* 525* 735* 597*    COAGS: Recent Labs    10/30/17 0259 12/23/17 0719  INR 0.97 1.26  APTT  --  31    BMP: Recent Labs    12/07/17 0940 12/21/17 0816 01/04/18 0847 01/18/18 0852  NA 133* 130* 134* 132*  K 3.5 3.3* 3.9 3.9  CL 100 98 103 104  CO2 27 24 26 23   GLUCOSE 102 116 100 118  BUN 13 16 11 12   CALCIUM 8.2* 8.1* 8.2* 8.2*  CREATININE 0.52* 0.63 0.58* 0.59*  GFRNONAA >60 >60 >60 >60  GFRAA >60 >60 >60 >60    LIVER FUNCTION TESTS: Recent Labs    12/07/17 0940 12/21/17 0816 01/04/18 0847 01/18/18 0852  BILITOT 0.3 0.3 <0.2* <0.2*  AST 11 13 13 11   ALT 9 9 10 8   ALKPHOS 62 61 70 67  PROT 4.7* 4.6* 4.7* 4.9*  ALBUMIN 2.1* 1.9* 1.8* 1.5*    TUMOR MARKERS: No results for input(s): AFPTM, CEA, CA199, CHROMGRNA in the last 8760 hours.  Assessment and Plan:  Colorectal cancer Recurrent pelvis cyst causing Bilat leg edema Aspiration of same x 2 so far Now scheduled for 3rd aspiration with possible sclerosing Risks and benefits discussed with the patient including, but not limited to bleeding, infection, damage to adjacent structures or low yield requiring additional tests.  All of the patient's questions were answered, patient is agreeable to proceed. Consent signed and in chart.  Thank you for this interesting consult.  I greatly enjoyed meeting MISCHELL BRANFORD and look  forward to participating in their care.  A copy of this report was sent to the requesting provider on this date.  Electronically Signed: Lavonia Drafts, PA-C 01/26/2018, 10:39 AM   I spent a total of    25 Minutes in face to face in clinical consultation, greater than 50% of which was counseling/coordinating care for pelvic cyst aspiration

## 2018-01-26 NOTE — Procedures (Signed)
  Procedure: CTaspiration cystic pelvic mass 664ml EBL:   minimal Complications:  none immediate  See full dictation in [facility].  Dillard Cannon MD Main # [phone] Pager  [phone]

## 2018-01-27 ENCOUNTER — Telehealth: Payer: Self-pay | Admitting: *Deleted

## 2018-01-27 ENCOUNTER — Ambulatory Visit: Payer: BLUE CROSS/BLUE SHIELD | Attending: Oncology

## 2018-01-27 DIAGNOSIS — R293 Abnormal posture: Secondary | ICD-10-CM | POA: Insufficient documentation

## 2018-01-27 DIAGNOSIS — I890 Lymphedema, not elsewhere classified: Secondary | ICD-10-CM

## 2018-01-27 DIAGNOSIS — M6281 Muscle weakness (generalized): Secondary | ICD-10-CM | POA: Insufficient documentation

## 2018-01-27 NOTE — Telephone Encounter (Signed)
Called [facility] with referral for PT for BLE edema and weakness. Requested skilled nursing eval and treatment as well for weeping legs.

## 2018-01-27 NOTE — Therapy (Signed)
[place], [place], [address] Phone: [phone]   Fax:  [phone]  Physical Therapy Treatment  Patient Details  Name: Carolyn Barton MRN: [id number] Date of Birth: [birth year] Referring Provider: Dr. Benay Spice    Encounter Date: 01/27/2018  PT End of Session - 01/27/18 1309    Visit Number  8    Number of Visits  17    Date for PT Re-Evaluation  03/03/18    PT Start Time  1106    PT Stop Time  1201    PT Time Calculation (min)  55 min    Activity Tolerance  Patient tolerated treatment well    Behavior During Therapy  [facility] for tasks assessed/performed       Past Medical History:  Diagnosis Date  . Abdominal wall cellulitis 03/10/2016  . Anxiety   . Ascites 10/27/2017   Small volume noted on CT  . Aspiration of liquid 12/23/2017   pelvic cyst/mass  . Bowel obstruction ([place])   . Chronic back pain    lumbar    . Colostomy in place [facility])   . Depression   . DVT (deep venous thrombosis) ([place]) 10/27/2017   Non occlusive left common femoral vein  . Dyspnea   . 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 GERD (gastroesophageal reflux disease)   . Grade I diastolic dysfunction 24/23/5361   ECHO   . History of chemotherapy last chemo 09-21-17  . History of hiatal hernia   . History of pelvic mass 11/2017   cyst  . History of pulmonary embolism 09/2017   Bilateral  . Hydronephrosis    right  . Hypokalemia    takes potassium  . Iron deficiency anemia 04/14/2016   treated w/ Iron infusions  . Liver metastasis ([place])    secondary to colorectal cancer  . Lower leg edema   . Metastatic colorectal cancer [facility]) dx 03/03/2016--- oncologist-- dr Benay Spice   metastatic colorectal carcinoma--  s/p  exp. lap. for bowel obstruction--  rectal mass, peritoneal carcinomatosis, liver mets---  chemotherapy  . Scoliosis   . Skin rash    on legs seeing wound care specialist  . Spinal headache  12/21/2012  . Tachycardia    persistant since discharged from hospital 06/ 2017 due to anemia and deconditioning;  as of 09-02-2016 per pt no issues w/ heart racing in the past few weeks  . Ureteral obstruction, left   . Wears contact lenses     Past Surgical History:  Procedure Laterality Date  . BIOPSY N/A 03/03/2016   Procedure: BIOPSY OF PERITONEAL NODULE;  Surgeon: Erroll Luna, MD;  Location: [place];  Service: General;  Laterality: N/A;  . COLOSTOMY N/A 03/03/2016   Procedure: DIVERTING SIGMOID COLOSTOMY;  Surgeon: Erroll Luna, MD;  Location: [place];  Service: General;  Laterality: N/A;  . CYSTOSCOPY W/ RETROGRADES Right 10/13/2017   Procedure: CYSTOSCOPY WITH RETROGRADE PYELOGRAM/ STENT PLACEMENT;  Surgeon: Ceasar Mons, MD;  Location[facility];  Service: Urology;  Laterality: Right;  ONLY NEEDS 30 MIN FOR BOTH PROCEDURES  . CYSTOSCOPY W/ URETERAL STENT PLACEMENT Left 10/09/2016   Procedure: CYSTOSCOPY WITH LEFT STENT REPLACEMENT 8F POLARIS STENT 24CM;  Surgeon: Carolan Clines, MD;  Location: [facility];  Service: Urology;  Laterality: Left;  . CYSTOSCOPY W/ URETERAL STENT PLACEMENT Left 03/16/2017   Procedure: CYSTOSCOPY WITH RETROGRADE PYELOGRAM WITH LEFT  STENT REMOVAL AND REPLACEMENT;  Surgeon: Carolan Clines, MD;  Location: [place];  Service: Urology;  Laterality: Left;  . CYSTOSCOPY W/ URETERAL STENT PLACEMENT Left 10/13/2017   Procedure: CYSTOSCOPY WITH STENT REPLACEMENT;  Surgeon: Ceasar Mons, MD;  Location: [facility];  Service: Urology;  Laterality: Left;  . CYSTOSCOPY WITH FULGERATION N/A 10/13/2017   Procedure: CYSTOSCOPY WITH FULGERATION;  Surgeon: Ceasar Mons, MD;  Location: [facility];  Service: Urology;  Laterality: N/A;  . CYSTOSCOPY WITH RETROGRADE PYELOGRAM, URETEROSCOPY AND STENT PLACEMENT Left 03/18/2016   Procedure: CYSTOSCOPY WITH LEFT   RETROGRADE PYELOGRAM,  AND  POLARIS STENT PLACEMENT;  Surgeon: Carolan Clines, MD;  Location: [facility];  Service: Urology;  Laterality: Left;  . LAPAROTOMY N/A 03/03/2016   Procedure: EXPLORATORY LAPAROTOMY;  Surgeon: Erroll Luna, MD;  Location: [place];  Service: General;  Laterality: N/A;  . LUMBAR LAMINECTOMY  11/11/2012   L4-5  and Resection synovial cyst  . MAXIMUM ACCESS (MAS)POSTERIOR LUMBAR INTERBODY FUSION (PLIF) 1 LEVEL N/A 01/11/2014   Procedure: FOR MAXIMUM ACCESS (MAS) POSTERIOR LUMBAR INTERBODY FUSION Lumbar Five Sacral One;  Surgeon: [name], MD;  Location: [facility] [facility];  Service: Neurosurgery;  Laterality: N/A;  FOR MAXIMUM ACCESS (MAS) POSTERIOR LUMBAR INTERBODY FUSION Lumbar Five Sacral One  . PORTACATH PLACEMENT  04/10/2016  . REPAIR CEREBROSPINAL FLUID LEAK POST RESECTION SYNOVIAL CYST  12/21/2012  . REVISION LUMBAR HARDWARE  01/26/2014  . TRANSTHORACIC ECHOCARDIOGRAM  06/17/2016   grade 1 diastolic function , ef 36-64%/  trivial TR  . WISDOM TOOTH EXTRACTION      There were no vitals filed for this visit.  Subjective Assessment - 01/27/18 1116    Subjective  I had the cyst drained yesterday and I think I can already tell an improvement in the leg swelling. My lift chair is supposed to arrive tomorrow and I know that will be a big help as well. We didn't get to change the dressings you applied yesterday as I was busy with the doctors appointment. I did bring a clean stockinette though to replace the soiled one I'm wearing now.     Pertinent History  Colon cancer diagnosed in June 2017 with abdominal surgery with colostomy, and chemotherapy now with complex cystic mass in central pelvis with mass effect on veins bilaerally.  she recently has an aspiration of 550 cc of fluid from mass that did not improve lymphedema in legs.  She has been elevating them with little benefit.     Patient Stated Goals  to get help with decreasing her swelling. " I want to get rid of all this  oozing"     Currently in Pain?  No/denies            LYMPHEDEMA/ONCOLOGY QUESTIONNAIRE - 01/27/18 1130      Right Lower Extremity Lymphedema   10 cm Proximal to Suprapatella  46.6 cm    At Midpatella/Popliteal Crease  44.7 cm    30 cm Proximal to Floor at Lateral Plantar Foot  40 cm    20 cm Proximal to Floor at Lateral Plantar Foot  34.2 1    10  cm Proximal to Floor at Lateral Malleoli  26.4 cm    5 cm Proximal to 1st MTP Joint  24.4 cm    Across MTP Joint  24.1 cm    Around Proximal Great Toe  8.3 cm      Left Lower Extremity Lymphedema   10 cm Proximal to Suprapatella  48.6 cm    At Midpatella/Popliteal  Crease  44.4 cm    10 cm Proximal to Floor at Lateral Malleoli  27.6 cm           Outpatient Re[facility]01/[facility]  Lymphedema Life Impact Scale Total Score  35.29 %           [facility] Adult PT Treatment/Exercise - 01/27/18 0001      Manual Therapy   Edema Management  Removed dressings from Mondays session which were saturated and dried to her leg so were painful to remove. Reminded pt these need to be changed daily for best case healing.     Manual Lymphatic Drainage (MLD)  In Supine with HOB elevated: Short neck, 5 diaphragamtic breaths, then Rt axillary nodes, Rt inguino-axillary anastomosis and Rt upper leg and knee then retracing all steps (did not do lower leg due to wounds).    Compression Bandaging  At end of session applied biotone to Rt and partially to Lt, lower legs; then placed 3 Tefla dressings over open, bleeding tissue and ABD pad over pinkened area of left leg, then Artiflex to hold inplace and cushion, then TG soft. Did not apply pts Circaid reduction garment as pt was going to run errands and did not feel comfortable having compression on her leg as this will cause friction on wounds. So instructed pt to apply both LE garments when she gets home with legs elevated. Pt reported Lt LE felt good at end of  session.                PT Short Term Goals - 01/21/18 1412      PT SHORT TERM GOAL #1   Title  Pt and husband have a way to manage lymphedema at home with MLD and compression     Status  On-going      PT SHORT TERM GOAL #2   Title  Pt will decrease left leg circumference to 28 cm at 10 cm proximal to lateral foot     Status  Achieved        PT Long Term Goals - 01/01/18 1337      PT LONG TERM GOAL #1   Title  Pt will report she is able to independently manage lymphedema at home    Time  8    Period  Weeks    Status  New      PT LONG TERM GOAL #2   Title  Pt willi increase her hip flexor strengthto 3+/5 so that she is  able to lift her leg so that she can get in and out of the car by herself     Time  8    Period  Weeks    Status  New      PT LONG TERM GOAL #3   Title  Pt will be able to walk > 100 feet independently without device so that she can manage better at home     Baseline  50 feet with min assist     Time  8    Period  Weeks    Status  New            Plan - 01/27/18 1310    Clinical Impression Statement  Pt did not change dressings yesterday reporting feeling too fatigued from procedure yesterday. But reinforced importance of allowing husband or family member to allow her to change these daily to allow for best wound healing bed. Pt has yet to hear from  home health agency so we will follow up with nurse navigator Dawn today. Focused on Lt LE MLD today as pts circumference is well reduced so wanted to further assist wound healing by trying to get more fluid out of LE.     Clinical Impairments Affecting Rehab Potential  Pelvic mass causing venous constriction     PT Frequency  2x / week    PT Duration  8 weeks    PT Treatment/Interventions  ADLs/Self Care Home Management;DME Instruction;Therapeutic exercise;Therapeutic activities;Patient/family education;Manual lymph drainage;Compression bandaging;Manual techniques    PT Next Visit Plan  Seeing pt  until home health begins. Assess whether pt./family changed bandages daily. Reapply Telfa and ABD. If pt brings velcro compression garments assess independence with these and frequencey of wear.    Consulted and Agree with Plan of Care  Patient       Patient will benefit from skilled therapeutic intervention in order to improve the following deficits and impairments:  Decreased knowledge of use of DME, Decreased skin integrity, Postural dysfunction, Increased edema, Decreased knowledge of precautions, Decreased strength  Visit Diagnosis: Lymphedema, not elsewhere classified     Problem List Patient Active Problem List   Diagnosis Date Noted  . Goals of care, counseling/discussion 12/30/2017  . Palliative care encounter   . Neoplasm related pain   . Tachycardia 10/27/2017  . Urinary retention 10/27/2017  . Lower urinary tract infectious disease 10/27/2017  . Chemotherapy-induced neutropenia ([place]) 10/27/2017  . UTI (urinary tract infection) 10/27/2017  . Left femoral vein DVT ([place]) 10/27/2017  . Sepsis ([place]) 10/27/2017  . Acute pyelonephritis 10/27/2017  . Chronic pain 10/23/2017  . Acute pulmonary embolism ([place]) 10/23/2017  . Bilateral pulmonary embolism ([place]) 10/22/2017  . Port-A-Cath in place 07/27/2017  . Port catheter in place 04/14/2016  . Iron deficiency anemia 04/14/2016  . Rectal cancer ([place]) 03/20/2016  . Leukocytosis   . Cellulitis 03/10/2016  . Malnutrition of moderate degree 03/07/2016  . Metastatic colorectal cancer ([place]) 03/03/2016  . Bowel obstruction ([place]) 02/29/2016  . Mass of colon 02/29/2016  . Nausea & vomiting 02/29/2016  . Lumbar radiculopathy 01/26/2014  . Essential thrombocytosis ([place]) 11/10/2011    Otelia Limes, PTA 01/27/2018, 1:24 PM  [place], [place], [address] Phone: [phone]   Fax:  [phone]  Name: Carolyn Barton MRN: [id number] Date of  Birth: 1956/05/22

## 2018-01-27 NOTE — Telephone Encounter (Signed)
Message from [place]ny Doon in intake at [facility]: They are unable to accept pt due to staffing.

## 2018-01-27 NOTE — Telephone Encounter (Signed)
-----   Message from Arna Snipe, RN sent at 01/27/2018  1:22 PM EDT ----- Regarding: FW: home health for this patient Have you heard anything about the referral to [place]?  Dawn ----- Message ----- From: Jomarie Longs, PT Sent: 01/27/2018   1:07 PM To: Suanne Marker, PTA, Dawn Virgina Evener, RN, # Subject: RE: home health for this patient               Hi Dawn,  Any news on getting Carolyn Barton in to see Carolyn Barton? She was in today and it sounds like they haven't heard anything.  Just checking-- Butch Penny   ----- Message ----- From: Arna Snipe, RN Sent: 01/21/2018   9:09 AM To: Jomarie Longs, PT, # Subject: RE: home health for this patient               Thank you Butch Penny. I walked down and shared this message with Dr. Gearldine Shown nurse and we will work on this.  Dawn ----- Message ----- From: Jomarie Longs, PT Sent: 01/20/2018   4:02 PM To: Suanne Marker, PTA, Arna Snipe, RN, # Subject: home health for this patient                   Hi Dr. Benay Spice,  Carolyn Barton, PTA, worked with Carolyn Barton today.  I had talked to Val in advance about the issues we discussed on the phone yesterday. It so happens that Val has a friend who does home health OT and is a lymphedema therapist. She is the only therapist we know who is lymphedema certified and works in home health.  Carolyn Barton would definitely like home health therapy because, as you said yesterday, it is so difficult for her to get into the car. Patients can't be seen by OT alone in home health, but Carolyn Barton would qualify for nursing care for her wounds, and that enables her to get OT as well.  So, if you are willing to order home health, this is the way to do it for Carolyn Barton to get lymphedema treatment by this OT:  The agency is [facility]. Please order the following: Skilled nursing eval and treat (perhaps mention wound care) OT for lymphedema treatment with Leta Speller PT for  weakness  I hope you don't mind the specifics we are suggesting. Thanks for working together on her care.  I will be seeing her again tomorrow.  I did look in on her with Val today.  Her wounds are better than they were last week, perhaps because she has been on the antibiotic. For the record, her daughter was with her today.  Feel free to call or reply with questions or concerns.  Best, Butch Penny

## 2018-01-31 ENCOUNTER — Other Ambulatory Visit: Payer: Self-pay | Admitting: Oncology

## 2018-02-01 ENCOUNTER — Telehealth: Payer: Self-pay

## 2018-02-01 ENCOUNTER — Inpatient Hospital Stay: Payer: BLUE CROSS/BLUE SHIELD | Attending: Oncology | Admitting: Oncology

## 2018-02-01 ENCOUNTER — Inpatient Hospital Stay: Payer: BLUE CROSS/BLUE SHIELD

## 2018-02-01 ENCOUNTER — Ambulatory Visit: Payer: BLUE CROSS/BLUE SHIELD

## 2018-02-01 VITALS — BP 121/77 | HR 96 | Temp 98.6°F | Resp 18 | Ht 65.0 in | Wt 139.8 lb

## 2018-02-01 DIAGNOSIS — I2699 Other pulmonary embolism without acute cor pulmonale: Secondary | ICD-10-CM | POA: Diagnosis not present

## 2018-02-01 DIAGNOSIS — C787 Secondary malignant neoplasm of liver and intrahepatic bile duct: Secondary | ICD-10-CM | POA: Insufficient documentation

## 2018-02-01 DIAGNOSIS — R1909 Other intra-abdominal and pelvic swelling, mass and lump: Secondary | ICD-10-CM

## 2018-02-01 DIAGNOSIS — Z5112 Encounter for antineoplastic immunotherapy: Secondary | ICD-10-CM | POA: Diagnosis present

## 2018-02-01 DIAGNOSIS — Z5111 Encounter for antineoplastic chemotherapy: Secondary | ICD-10-CM | POA: Insufficient documentation

## 2018-02-01 DIAGNOSIS — C20 Malignant neoplasm of rectum: Secondary | ICD-10-CM

## 2018-02-01 DIAGNOSIS — C19 Malignant neoplasm of rectosigmoid junction: Secondary | ICD-10-CM

## 2018-02-01 DIAGNOSIS — C786 Secondary malignant neoplasm of retroperitoneum and peritoneum: Secondary | ICD-10-CM | POA: Insufficient documentation

## 2018-02-01 DIAGNOSIS — Z7901 Long term (current) use of anticoagulants: Secondary | ICD-10-CM | POA: Insufficient documentation

## 2018-02-01 DIAGNOSIS — R600 Localized edema: Secondary | ICD-10-CM | POA: Diagnosis not present

## 2018-02-01 DIAGNOSIS — I82412 Acute embolism and thrombosis of left femoral vein: Secondary | ICD-10-CM | POA: Diagnosis not present

## 2018-02-01 NOTE — Telephone Encounter (Signed)
Printed avs and calender of upcoming appointment. Per 5/6 los also added pump stop due care plan orders

## 2018-02-01 NOTE — Progress Notes (Signed)
[place] OFFICE PROGRESS NOTE   Diagnosis: Colon cancer  INTERVAL HISTORY:   Carolyn Barton returns as scheduled.  She underwent CT aspiration of the pelvic cyst on 01/27/2018.  600 cc of fluid were removed.  She reports marked improvement in the bilateral leg edema.  She continues follow-up at the physical therapy and wound clinics.   Objective:  Vital signs in last 24 hours:  Blood pressure 121/77, pulse 96, temperature 98.6 F (37 C), temperature source Oral, resp. rate 18, height '5\' 5"'$  (1.651 m), weight 139 lb 12.8 oz (63.4 kg), SpO2 100 %.    Resp: Lungs clear bilaterally Cardio: Regular rate and rhythm GI: No hepatomegaly, nontender, left lower quadrant colostomy Vascular: 1-2+ edema throughout both legs, wrap in place at the left lower leg    Portacath/PICC-without erythema  Lab Results:  Lab Results  Component Value Date   WBC 7.0 01/26/2018   HGB 8.4 (L) 01/26/2018   HCT 27.2 (L) 01/26/2018   MCV 80.7 01/26/2018   PLT 460 (H) 01/26/2018   NEUTROABS 4.8 01/18/2018    CMP     Component Value Date/Time   NA 132 (L) 01/18/2018 0852   NA 139 09/21/2017 0746   K 3.9 01/18/2018 0852   K 3.3 (L) 09/21/2017 0746   CL 104 01/18/2018 0852   CL 101 12/01/2012 0924   CO2 23 01/18/2018 0852   CO2 28 09/21/2017 0746   GLUCOSE 118 01/18/2018 0852   GLUCOSE 92 09/21/2017 0746   GLUCOSE 94 12/01/2012 0924   BUN 12 01/18/2018 0852   BUN 9.3 09/21/2017 0746   CREATININE 0.59 (L) 01/18/2018 0852   CREATININE 0.6 09/21/2017 0746   CALCIUM 8.2 (L) 01/18/2018 0852   CALCIUM 8.7 09/21/2017 0746   PROT 4.9 (L) 01/18/2018 0852   PROT 6.2 (L) 09/21/2017 0746   ALBUMIN 1.5 (L) 01/18/2018 0852   ALBUMIN 3.2 (L) 09/21/2017 0746   AST 11 01/18/2018 0852   AST 19 09/21/2017 0746   ALT 8 01/18/2018 0852   ALT 12 09/21/2017 0746   ALKPHOS 67 01/18/2018 0852   ALKPHOS 105 09/21/2017 0746   BILITOT <0.2 (L) 01/18/2018 0852   BILITOT 0.41 09/21/2017 0746   GFRNONAA >60 01/18/2018 0852   GFRAA >60 01/18/2018 0852    Lab Results  Component Value Date   CEA1 2.29 01/04/2018    Lab Results  Component Value Date   INR 1.03 01/26/2018    Imaging:  No results found.  Medications: I have reviewed the patient's current medications.   Assessment/Plan: 1.Metastatic colorectal cancer-status post an exploratory laparotomy 03/03/2016 revealing a proximal rectal mass, peritoneal carcinomatosis, and liver metastases  Biopsy of peritoneal nodules 03/03/2016 confirmed metastatic adenocarcinoma consistent with a colon primary  Foundation 1 testing-MSI-stable, tumor mutation burden-low, no BRAF NRAS or KRAS mutation  Cycle 1 FOLFIRI/PANITUMUMAB 04/14/2016  Cycle 2 FOLFIRI/panitumumab 04/28/2016  Cycle 3 FOLFIRI/panitumumab 05/12/2016  Cycle 4 FOLFIRI/panitumumab 05/27/2016  Cycle 5 FOLFIRI/panitumumab 06/09/2016  Restaging CT abdomen/pelvis 06/20/2016-no evidence of disease progression, decreased left hepatic lesion  Cycle 6 FOLFIRI/panitumumab 06/23/2016  Cycle 7 FOLFIRI/panitumumab 07/07/2016  Cycle 8 FOLFIRI/panitumumab 07/21/2016  Cycle 9 FOLFIRI/PANITUMUMAB 08/04/2016  Cycle 10 FOLFIRI/panitumumab 08/18/2016  Restaging CT 08/29/2016 with interval decrease in the size of the medial segment left liver lesion. Lesion identified previously in the rectosigmoid colon not evident on the current study.  5-FU/PANITUMUMAB 09/01/2016  Xeloda 7 days on/7 days off and PANITUMUMAB every 3 weeks 09/15/2016 (awaiting insurance approval for Xeloda 09/15/2016)  CT abdomen/pelvis 12/25/2016-unchanged 5 mm right hepatic lesion, resolution of medial segment left liver lesion, no new liver lesion. Residual soft tissue fullness in the sigmoid , persistent distal esophageal wall thickening  Continuationof PANITUMUMAB every 3 weeks and Xeloda 7 days on/7 days off  CT 06/19/2017-new cystic/solid lesion in the left adnexa  Xeloda/panitumumab  continued  CT 10/08/2016-progression of cystic pelvic mass  Cycle 1 FOLFIRI/Panitumumab 10/21/2017  Cystic pelvic mass aspirated 10/30/2017  Cycle 2 FOLFIRI/panitumumab 11/09/2017  Cycle 3 FOLFIRI/panitumumab 11/23/2017  Cycle 4 FOLFIRI/Panitumumab 12/07/2017  Cycle 5 FOLFIRI/panitumumab 12/21/2017  CT pelvis 12/23/2017- complex cystic pelvic mass with mass-effect on the pelvic vasculature  CT aspiration of cystic pelvic mass 12/23/2017  Cycle 1 FOLFOX/Avastin 01/04/2018  Cycle 2 FOLFOX/Avastin 01/18/2018  2.History of aBowel obstruction secondary to #1  3. Chronic back pain-maintained on methadone  4. Essential thrombocytosis  5. Early obstruction of the left ureter noted at the time of surgery 03/03/2016-Dr. Tannenbaum placed a left double-J stent 03/18/2016  New onset right hydronephrosis 10/11/2017  Right ureter stent placement 10/13/2017  6. Abdominal wall cellulitis 03/10/2016, blood cultures positive for coagulase negative staphylococcus-methicillin-resistant, status post treatment with vancomycin and Bactrim   8. Port-A-Cath placement 04/10/2016, interventional radiology  9. Iron deficiency anemia. Feraheme 11/05/2015 , 11/12/2015,and 04/14/2016-persistent anemia and red cell microcytosis, oral iron resumed 11/17/2016-stable  10. Pain/tenderness left posterior iliac region-resolved  11. Hypokalemia-started on potassium replacement 06/09/2016  12. History of skin rashand paronychiasecondary to [facility], she continues minocycline, moisturizers, and fluticasone as needed  13.Acute onset right low back pain 10/11/2017-likely secondary to obstruction of the right kidney;left ureter stent exchange and right ureter stent placement 10/13/2017  14.Left leg pain and edema-bilateral lower extremity venous Doppler negative for DVT1/15/2019 and 10/19/2017. Left common femoral DVT noted on CT venogram 10/27/2017;CT aspiration of pelvic cystic  mass 10/30/2017 with subsequent marked improvement of leg edema.  15. Admission 10/22/2017 with bilateral pulmonary embolism, heparin drip initiated, converted to Lovenox 10/23/2017, converted to Xarelto during the 10/27/2017 hospital admission  16. Admission 10/27/2017 with increased back/leg pain and a fever-blood and urine cultures positive fora resistant E. coli     [name] has completed 2 cycles of salvage therapy with FOLFOX/Avastin.  She underwent CT drainage of the pelvic cystic lesion on 01/27/2018.  The leg edema is much improved today.  She will continue follow-up at the physical therapy clinic for management of leg edema.  She is also followed at the wound clinic.  She will arrange for exchange of the ureter stents.  Carolyn Barton will return for an office visit and the next scheduled cycle of FOLFOX/Avastin on 02/08/2018.  We will refer her for another cyst drainage if the leg edema progresses.  [name] Coder, MD  02/01/2018  10:02 AM

## 2018-02-02 ENCOUNTER — Ambulatory Visit: Payer: BLUE CROSS/BLUE SHIELD | Admitting: Physical Therapy

## 2018-02-02 ENCOUNTER — Other Ambulatory Visit: Payer: Self-pay

## 2018-02-02 DIAGNOSIS — I890 Lymphedema, not elsewhere classified: Secondary | ICD-10-CM | POA: Diagnosis not present

## 2018-02-02 DIAGNOSIS — M79605 Pain in left leg: Secondary | ICD-10-CM

## 2018-02-02 DIAGNOSIS — R293 Abnormal posture: Secondary | ICD-10-CM

## 2018-02-02 DIAGNOSIS — L03116 Cellulitis of left lower limb: Secondary | ICD-10-CM

## 2018-02-02 DIAGNOSIS — M6281 Muscle weakness (generalized): Secondary | ICD-10-CM

## 2018-02-02 NOTE — Therapy (Signed)
[place], [place], [address] Phone: [phone]   Fax:  [phone]  Physical Therapy Treatment  Patient Details  Name: Carolyn Barton MRN: [id number] Date of Birth: [birth year] Referring Provider: Dr. Benay Spice    Encounter Date: 02/02/2018  PT End of Session - 02/02/18 1712    Visit Number  9    Number of Visits  17    Date for PT Re-Evaluation  03/03/18    PT Start Time  1435    PT Stop Time  1517    PT Time Calculation (min)  42 min    Activity Tolerance  Patient tolerated treatment well    Behavior During Therapy  [facility] for tasks assessed/performed       Past Medical History:  Diagnosis Date  . Abdominal wall cellulitis 03/10/2016  . Anxiety   . Ascites 10/27/2017   Small volume noted on CT  . Aspiration of liquid 12/23/2017   pelvic cyst/mass  . Bowel obstruction ([place])   . Chronic back pain    lumbar    . Colostomy in place [facility])   . Depression   . DVT (deep venous thrombosis) ([place]) 10/27/2017   Non occlusive left common femoral vein  . Dyspnea   . 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 GERD (gastroesophageal reflux disease)   . Grade I diastolic dysfunction 53/29/9242   ECHO   . History of chemotherapy last chemo 09-21-17  . History of hiatal hernia   . History of pelvic mass 11/2017   cyst  . History of pulmonary embolism 09/2017   Bilateral  . Hydronephrosis    right  . Hypokalemia    takes potassium  . Iron deficiency anemia 04/14/2016   treated w/ Iron infusions  . Liver metastasis ([place])    secondary to colorectal cancer  . Lower leg edema   . Metastatic colorectal cancer [facility]) dx 03/03/2016--- oncologist-- dr Benay Spice   metastatic colorectal carcinoma--  s/p  exp. lap. for bowel obstruction--  rectal mass, peritoneal carcinomatosis, liver mets---  chemotherapy  . Scoliosis   . Skin rash    on legs seeing wound care specialist  . Spinal headache  12/21/2012  . Tachycardia    persistant since discharged from hospital 06/ 2017 due to anemia and deconditioning;  as of 09-02-2016 per pt no issues w/ heart racing in the past few weeks  . Ureteral obstruction, left   . Wears contact lenses     Past Surgical History:  Procedure Laterality Date  . BIOPSY N/A 03/03/2016   Procedure: BIOPSY OF PERITONEAL NODULE;  Surgeon: Erroll Luna, MD;  Location: [place];  Service: General;  Laterality: N/A;  . COLOSTOMY N/A 03/03/2016   Procedure: DIVERTING SIGMOID COLOSTOMY;  Surgeon: Erroll Luna, MD;  Location: [place];  Service: General;  Laterality: N/A;  . CYSTOSCOPY W/ RETROGRADES Right 10/13/2017   Procedure: CYSTOSCOPY WITH RETROGRADE PYELOGRAM/ STENT PLACEMENT;  Surgeon: Ceasar Mons, MD;  [facility];  Service: Urology;  Laterality: Right;  ONLY NEEDS 30 MIN FOR BOTH PROCEDURES  . CYSTOSCOPY W/ URETERAL STENT PLACEMENT Left 10/09/2016   Procedure: CYSTOSCOPY WITH LEFT STENT REPLACEMENT 36F POLARIS STENT 24CM;  Surgeon: Carolan Clines, MD;  Location: [facility];  Service: Urology;  Laterality: Left;  . CYSTOSCOPY W/ URETERAL STENT PLACEMENT Left 03/16/2017   Procedure: CYSTOSCOPY WITH RETROGRADE PYELOGRAM WITH LEFT  STENT REMOVAL AND REPLACEMENT;  Surgeon: Carolan Clines, MD;  Location: [place];  Service: Urology;  Laterality: Left;  . CYSTOSCOPY W/ URETERAL STENT PLACEMENT Left 10/13/2017   Procedure: CYSTOSCOPY WITH STENT REPLACEMENT;  Surgeon: Ceasar Mons, MD;  Location: [facility];  Service: Urology;  Laterality: Left;  . CYSTOSCOPY WITH FULGERATION N/A 10/13/2017   Procedure: CYSTOSCOPY WITH FULGERATION;  Surgeon: Ceasar Mons, MD;  Location: [facility];  Service: Urology;  Laterality: N/A;  . CYSTOSCOPY WITH RETROGRADE PYELOGRAM, URETEROSCOPY AND STENT PLACEMENT Left 03/18/2016   Procedure: CYSTOSCOPY WITH LEFT   RETROGRADE PYELOGRAM,  AND  POLARIS STENT PLACEMENT;  Surgeon: Carolan Clines, MD;  [facility];  Service: Urology;  Laterality: Left;  . LAPAROTOMY N/A 03/03/2016   Procedure: EXPLORATORY LAPAROTOMY;  Surgeon: Erroll Luna, MD;  Location: [place];  Service: General;  Laterality: N/A;  . LUMBAR LAMINECTOMY  11/11/2012   L4-5  and Resection synovial cyst  . MAXIMUM ACCESS (MAS)POSTERIOR LUMBAR INTERBODY FUSION (PLIF) 1 LEVEL N/A 01/11/2014   Procedure: FOR MAXIMUM ACCESS (MAS) POSTERIOR LUMBAR INTERBODY FUSION Lumbar Five Sacral One;  Surgeon: [name], MD;  Location: [place] [facility];  Service: Neurosurgery;  Laterality: N/A;  FOR MAXIMUM ACCESS (MAS) POSTERIOR LUMBAR INTERBODY FUSION Lumbar Five Sacral One  . PORTACATH PLACEMENT  04/10/2016  . REPAIR CEREBROSPINAL FLUID LEAK POST RESECTION SYNOVIAL CYST  12/21/2012  . REVISION LUMBAR HARDWARE  01/26/2014  . TRANSTHORACIC ECHOCARDIOGRAM  06/17/2016   grade 1 diastolic function , ef 97-35%/  trivial TR  . WISDOM TOOTH EXTRACTION      There were no vitals filed for this visit.  Subjective Assessment - 02/02/18 1436    Subjective  "I'm doing a thousand times better.  I got my lift chair for one, so I can keep my legs propped up." The aspiration procedure also helped.  My husband changes the bandage everyday but somehow we just didn't get it done yesterday. T he plan is to have the aspiration done every couple of months.    Pertinent History  Colon cancer diagnosed in June 2017 with abdominal surgery with colostomy, and chemotherapy now with complex cystic mass in central pelvis with mass effect on veins bilaerally.  she recently has an aspiration of 550 cc of fluid from mass that did not improve lymphedema in legs.  She has been elevating them with little benefit.     Currently in Pain?  No/denies                  Outpatient [facility]  Lymphedema Life Impact Scale Total  Score  35.29 %           [facility] Adult PT Treatment/Exercise - 02/02/18 0001      Manual Therapy   Edema Management  Removed dressings from last session which showed some wound drainage, but leg wounds looked much better than previously to this therapist, and patient exclaimed several times about how much better they looked.  There was little weeping today from about six discreet spots on left anterior leg.    Manual Lymphatic Drainage (MLD)  In Supine with HOB elevated: Short neck, 5 diaphragamtic breaths, then Rt axillary nodes, Rt inguino-axillary anastomosis and Rt upper leg, knee, lower leg and dorsal foot, then retraced steps. Same to left LE but to knee only because of open wounds.     Compression Bandaging  lotion applied to posterior left lower leg; three Telfa pads, one abd pad and Artiflex applied  at left leg, kept in place by thick stockinette                  PT Short Term Goals - 01/21/18 1412      PT SHORT TERM GOAL #1   Title  Pt and husband have a way to manage lymphedema at home with MLD and compression     Status  On-going      PT SHORT TERM GOAL #2   Title  Pt will decrease left leg circumference to 28 cm at 10 cm proximal to lateral foot     Status  Achieved        PT Long Term Goals - 01/01/18 1337      PT LONG TERM GOAL #1   Title  Pt will report she is able to independently manage lymphedema at home    Time  8    Period  Weeks    Status  New      PT LONG TERM GOAL #2   Title  Pt willi increase her hip flexor strengthto 3+/5 so that she is  able to lift her leg so that she can get in and out of the car by herself     Time  8    Period  Weeks    Status  New      PT LONG TERM GOAL #3   Title  Pt will be able to walk > 100 feet independently without device so that she can manage better at home     Baseline  50 feet with min assist     Time  8    Period  Weeks    Status  New            Plan - 02/02/18 1713    Clinical Impression  Statement  Pt. looked better and felt much better today, with less fluid in her legs and less weeping from the left leg wounds. We discussed the hope that home health could come in to help heal wounds, and then patient might return here if she needs further lymphedema treatment. The aspiration of fluid from the pelvic tumor seems to make a big difference to her swelling and how she feels.    Clinical Impairments Affecting Rehab Potential  Pelvic mass causing venous constriction     PT Frequency  2x / week    PT Duration  8 weeks    PT Treatment/Interventions  ADLs/Self Care Home Management;DME Instruction;Therapeutic exercise;Therapeutic activities;Patient/family education;Manual lymph drainage;Compression bandaging;Manual techniques    PT Next Visit Plan  Check goals. Seeing pt until home health begins. Assess whether pt./family changed bandages daily. Reapply Telfa and ABD. If pt brings velcro compression garments assess independence with these and frequencey of wear.    Consulted and Agree with Plan of Care  Patient       Patient will benefit from skilled therapeutic intervention in order to improve the following deficits and impairments:  Decreased knowledge of use of DME, Decreased skin integrity, Postural dysfunction, Increased edema, Decreased knowledge of precautions, Decreased strength  Visit Diagnosis: Lymphedema, not elsewhere classified  Abnormal posture  Muscle weakness (generalized)     Problem List Patient Active Problem List   Diagnosis Date Noted  . Goals of care, counseling/discussion 12/30/2017  . Palliative care encounter   . Neoplasm related pain   . Tachycardia 10/27/2017  . Urinary retention 10/27/2017  . Lower urinary tract infectious disease 10/27/2017  . Chemotherapy-induced neutropenia ([place]) 10/27/2017  .  UTI (urinary tract infection) 10/27/2017  . Left femoral vein DVT ([place]) 10/27/2017  . Sepsis ([place]) 10/27/2017  . Acute pyelonephritis 10/27/2017  .  Chronic pain 10/23/2017  . Acute pulmonary embolism ([place]) 10/23/2017  . Bilateral pulmonary embolism ([place]) 10/22/2017  . Port-A-Cath in place 07/27/2017  . Port catheter in place 04/14/2016  . Iron deficiency anemia 04/14/2016  . Rectal cancer ([place]) 03/20/2016  . Leukocytosis   . Cellulitis 03/10/2016  . Malnutrition of moderate degree 03/07/2016  . Metastatic colorectal cancer ([place]) 03/03/2016  . Bowel obstruction ([place]) 02/29/2016  . Mass of colon 02/29/2016  . Nausea & vomiting 02/29/2016  . Lumbar radiculopathy 01/26/2014  . Essential thrombocytosis ([place]) 11/10/2011    Emaleigh Guimond 02/02/2018, 5:17 PM  [place], [place], [address] Phone: [phone]   Fax:  [phone]  Name: Carolyn Barton MRN: [id number] Date of Birth: November 14, 1955  Serafina Royals, PT 02/02/18 5:17 PM

## 2018-02-03 ENCOUNTER — Ambulatory Visit: Payer: BLUE CROSS/BLUE SHIELD

## 2018-02-03 DIAGNOSIS — I890 Lymphedema, not elsewhere classified: Secondary | ICD-10-CM | POA: Diagnosis not present

## 2018-02-03 DIAGNOSIS — M6281 Muscle weakness (generalized): Secondary | ICD-10-CM

## 2018-02-03 DIAGNOSIS — R293 Abnormal posture: Secondary | ICD-10-CM

## 2018-02-03 NOTE — Therapy (Addendum)
Progress Note Reporting Period 01/01/18 to 02/03/18  See note below for Objective Data and Assessment of Progress/Goals.       [place], [place], [address] Phone: [phone]   Fax:  [phone]  Physical Therapy Treatment  Patient Details  Name: Carolyn Barton MRN: [id number] Date of Birth: 02-01-1956 Referring Provider: Dr. Benay Spice    Encounter Date: 02/03/2018  PT End of Session - 02/03/18 1612    Visit Number  10    Number of Visits  17    Date for PT Re-Evaluation  03/03/18    PT Start Time  1522    PT Stop Time  1609    PT Time Calculation (min)  47 min    Activity Tolerance  Patient tolerated treatment well    Behavior During Therapy  [facility] for tasks assessed/performed       Past Medical History:  Diagnosis Date  . Abdominal wall cellulitis 03/10/2016  . Anxiety   . Ascites 10/27/2017   Small volume noted on CT  . Aspiration of liquid 12/23/2017   pelvic cyst/mass  . Bowel obstruction ([place])   . Chronic back pain    lumbar    . Colostomy in place [facility])   . Depression   . DVT (deep venous thrombosis) ([place]) 10/27/2017   Non occlusive left common femoral vein  . Dyspnea   . 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 GERD (gastroesophageal reflux disease)   . Grade I diastolic dysfunction 33/54/5625   ECHO   . History of chemotherapy last chemo 09-21-17  . History of hiatal hernia   . History of pelvic mass 11/2017   cyst  . History of pulmonary embolism 09/2017   Bilateral  . Hydronephrosis    right  . Hypokalemia    takes potassium  . Iron deficiency anemia 04/14/2016   treated w/ Iron infusions  . Liver metastasis ([place])    secondary to colorectal cancer  . Lower leg edema   . Metastatic colorectal cancer [facility]) dx 03/03/2016--- oncologist-- dr Benay Spice   metastatic colorectal carcinoma--  s/p  exp. lap. for bowel obstruction--  rectal mass, peritoneal  carcinomatosis, liver mets---  chemotherapy  . Scoliosis   . Skin rash    on legs seeing wound care specialist  . Spinal headache 12/21/2012  . Tachycardia    persistant since discharged from hospital 06/ 2017 due to anemia and deconditioning;  as of 09-02-2016 per pt no issues w/ heart racing in the past few weeks  . Ureteral obstruction, left   . Wears contact lenses     Past Surgical History:  Procedure Laterality Date  . BIOPSY N/A 03/03/2016   Procedure: BIOPSY OF PERITONEAL NODULE;  Surgeon: Erroll Luna, MD;  Location: [place];  Service: General;  Laterality: N/A;  . COLOSTOMY N/A 03/03/2016   Procedure: DIVERTING SIGMOID COLOSTOMY;  Surgeon: Erroll Luna, MD;  Location: [place];  Service: General;  Laterality: N/A;  . CYSTOSCOPY W/ RETROGRADES Right 10/13/2017   Procedure: CYSTOSCOPY WITH RETROGRADE PYELOGRAM/ STENT PLACEMENT;  Surgeon: Ceasar Mons, MD;  [facility];  Service: Urology;  Laterality: Right;  ONLY NEEDS 30 MIN FOR BOTH PROCEDURES  . CYSTOSCOPY W/ URETERAL STENT PLACEMENT Left 10/09/2016   Procedure: CYSTOSCOPY WITH LEFT STENT REPLACEMENT 16F POLARIS STENT 24CM;  Surgeon: Carolan Clines, MD;  Location: [facility];  Service: Urology;  Laterality: Left;  . CYSTOSCOPY W/  URETERAL STENT PLACEMENT Left 03/16/2017   Procedure: CYSTOSCOPY WITH RETROGRADE PYELOGRAM WITH LEFT  STENT REMOVAL AND REPLACEMENT;  Surgeon: Carolan Clines, MD;  Location: [facility];  Service: Urology;  Laterality: Left;  . CYSTOSCOPY W/ URETERAL STENT PLACEMENT Left 10/13/2017   Procedure: CYSTOSCOPY WITH STENT REPLACEMENT;  Surgeon: Ceasar Mons, MD;  Location: [facility];  Service: Urology;  Laterality: Left;  . CYSTOSCOPY WITH FULGERATION N/A 10/13/2017   Procedure: CYSTOSCOPY WITH FULGERATION;  Surgeon: Ceasar Mons, MD;  Location: [facility];  Service: Urology;   Laterality: N/A;  . CYSTOSCOPY WITH RETROGRADE PYELOGRAM, URETEROSCOPY AND STENT PLACEMENT Left 03/18/2016   Procedure: CYSTOSCOPY WITH LEFT  RETROGRADE PYELOGRAM,  AND  POLARIS STENT PLACEMENT;  Surgeon: Carolan Clines, MD;  [facility];  Service: Urology;  Laterality: Left;  . LAPAROTOMY N/A 03/03/2016   Procedure: EXPLORATORY LAPAROTOMY;  Surgeon: Erroll Luna, MD;  Location: [place];  Service: General;  Laterality: N/A;  . LUMBAR LAMINECTOMY  11/11/2012   L4-5  and Resection synovial cyst  . MAXIMUM ACCESS (MAS)POSTERIOR LUMBAR INTERBODY FUSION (PLIF) 1 LEVEL N/A 01/11/2014   Procedure: FOR MAXIMUM ACCESS (MAS) POSTERIOR LUMBAR INTERBODY FUSION Lumbar Five Sacral One;  Surgeon: [name], MD;  Location: [place] [facility];  Service: Neurosurgery;  Laterality: N/A;  FOR MAXIMUM ACCESS (MAS) POSTERIOR LUMBAR INTERBODY FUSION Lumbar Five Sacral One  . PORTACATH PLACEMENT  04/10/2016  . REPAIR CEREBROSPINAL FLUID LEAK POST RESECTION SYNOVIAL CYST  12/21/2012  . REVISION LUMBAR HARDWARE  01/26/2014  . TRANSTHORACIC ECHOCARDIOGRAM  06/17/2016   grade 1 diastolic function , ef 06-23%/  trivial TR  . WISDOM TOOTH EXTRACTION      There were no vitals filed for this visit.  Subjective Assessment - 02/03/18 1526    Subjective  Nothing much new since yesterday.     Pertinent History  Colon cancer diagnosed in June 2017 with abdominal surgery with colostomy, and chemotherapy now with complex cystic mass in central pelvis with mass effect on veins bilaerally.  she recently has an aspiration of 550 cc of fluid from mass that did not improve lymphedema in legs.  She has been elevating them with little benefit.     Patient Stated Goals  to get help with decreasing her swelling. " I want to get rid of all this oozing"     Currently in Pain?  No/denies         [facility] PT Assessment - 02/03/18 0001      Strength   Right Hip Flexion  3/5    Left Hip Flexion  3-/5              Outpatient Rehab  from [facility]/01/[facility]  Lymphedema Life Impact Scale Total Score  35.29 %           [facility] Adult PT Treatment/Exercise - 02/03/18 0001      Manual Therapy   Edema Management  Removed dressings from last session which showed some wound drainage, but leg wounds looked much better than previously to this therapist, and patient exclaimed several times about how much better they looked.  There was little weeping today from about six discreet spots on left anterior leg.    Manual Lymphatic Drainage (MLD)  In Supine with HOB elevated: Short neck, 5 diaphragamtic breaths, then Rt axillary nodes, Rt inguino-axillary anastomosis and Rt upper leg, knee, lower leg and dorsal foot, then retraced steps. Same to left LE  but to knee only because of open wounds.     Compression Bandaging  lotion applied to posterior left lower leg; three Telfa pads, one abd pad and Artiflex applied at left leg, kept in place by thick stockinette, then to Rt lower leg, lotion, thick stockinette and velcro compression garment. Daughter to apply Lt leg when they get home and pts legs elevated               PT Short Term Goals - 01/21/18 1412      PT SHORT TERM GOAL #1   Title  Pt and husband have a way to manage lymphedema at home with MLD and compression     Status  On-going      PT SHORT TERM GOAL #2   Title  Pt will decrease left leg circumference to 28 cm at 10 cm proximal to lateral foot     Status  Achieved        PT Long Term Goals - 02/03/18 1533      PT LONG TERM GOAL #1   Title  Pt will report she is able to independently manage lymphedema at home    Baseline  Pt has lift chair now so elevates her legs more frequently than was able to before, also has velcro compression garments to wear at home-02/03/18    Status  Achieved      PT LONG TERM GOAL #2   Title  Pt willi increase her hip flexor strengthto 3+/5 so that she is  able to lift her leg so that she can  get in and out of the car by herself     Baseline  Pt reports this much improved, just needs min A intermittently as leg swelling has improved (Rt LE 3-/5, Lt LE 3/5)-02/03/18    Status  On-going      PT LONG TERM GOAL #3   Title  Pt will be able to walk > 100 feet independently without device so that she can manage better at home     Baseline  50 feet with min assist; pt and daugther report pt is walking more than 100 ft now without assist-02/03/18    Status  Achieved            Plan - 02/03/18 1632    Clinical Impression Statement  Pt continued to look better today as when this therapist saw her last. Her wounds seem to be healing well as her pelvic tumow was aspirated about 2 weeks ago (600 cc removed) and this seems to be making a big difference. She also attributes alot to her recently received lift chair. Daughter also reports noting improvements with her mom from when she last visited (lives in [place]). Also that she is able to walk much further distances now without assistive device (goal met). Pt also reports getting in/out of car some easier (Rt LE strength improved, progress toward goal).  Pt hasn't heard from home health yet, plans to stop outpt once home health begins for her wounds.     Clinical Impairments Affecting Rehab Potential  Pelvic mass causing venous constriction     PT Frequency  2x / week    PT Duration  8 weeks    PT Treatment/Interventions  ADLs/Self Care Home Management;DME Instruction;Therapeutic exercise;Therapeutic activities;Patient/family education;Manual lymph drainage;Compression bandaging;Manual techniques    PT Next Visit Plan  Seeing pt until home health begins. Assess whether pt./family changed bandages daily. Reapply Telfa and ABD. If pt brings velcro compression  garments assess independence with these and frequencey of wear.    Consulted and Agree with Plan of Care  Patient       Patient will benefit from skilled therapeutic intervention in order to  improve the following deficits and impairments:  Decreased knowledge of use of DME, Decreased skin integrity, Postural dysfunction, Increased edema, Decreased knowledge of precautions, Decreased strength  Visit Diagnosis: Lymphedema, not elsewhere classified  Abnormal posture  Muscle weakness (generalized)     Problem List Patient Active Problem List   Diagnosis Date Noted  . Goals of care, counseling/discussion 12/30/2017  . Palliative care encounter   . Neoplasm related pain   . Tachycardia 10/27/2017  . Urinary retention 10/27/2017  . Lower urinary tract infectious disease 10/27/2017  . Chemotherapy-induced neutropenia ([place]) 10/27/2017  . UTI (urinary tract infection) 10/27/2017  . Left femoral vein DVT ([place]) 10/27/2017  . Sepsis ([place]) 10/27/2017  . Acute pyelonephritis 10/27/2017  . Chronic pain 10/23/2017  . Acute pulmonary embolism ([place]) 10/23/2017  . Bilateral pulmonary embolism ([place]) 10/22/2017  . Port-A-Cath in place 07/27/2017  . Port catheter in place 04/14/2016  . Iron deficiency anemia 04/14/2016  . Rectal cancer ([place]) 03/20/2016  . Leukocytosis   . Cellulitis 03/10/2016  . Malnutrition of moderate degree 03/07/2016  . Metastatic colorectal cancer ([place]) 03/03/2016  . Bowel obstruction ([place]) 02/29/2016  . Mass of colon 02/29/2016  . Nausea & vomiting 02/29/2016  . Lumbar radiculopathy 01/26/2014  . Essential thrombocytosis ([place]) 11/10/2011    Otelia Limes, PTA 02/03/2018, 5:16 PM  [place], [place], [address] Phone: [phone]   Fax:  [phone]  Name: Carolyn Barton MRN: [id number] Date of Birth: 1956/08/06  Serafina Royals, PT 02/03/18 5:32 PM

## 2018-02-05 ENCOUNTER — Other Ambulatory Visit: Payer: Self-pay | Admitting: Oncology

## 2018-02-05 DIAGNOSIS — I2699 Other pulmonary embolism without acute cor pulmonale: Secondary | ICD-10-CM

## 2018-02-07 ENCOUNTER — Other Ambulatory Visit: Payer: Self-pay | Admitting: Oncology

## 2018-02-08 ENCOUNTER — Inpatient Hospital Stay (HOSPITAL_BASED_OUTPATIENT_CLINIC_OR_DEPARTMENT_OTHER): Payer: BLUE CROSS/BLUE SHIELD | Admitting: Oncology

## 2018-02-08 ENCOUNTER — Encounter: Payer: Self-pay | Admitting: General Practice

## 2018-02-08 ENCOUNTER — Inpatient Hospital Stay: Payer: BLUE CROSS/BLUE SHIELD

## 2018-02-08 ENCOUNTER — Telehealth: Payer: Self-pay | Admitting: Oncology

## 2018-02-08 VITALS — BP 123/82 | HR 77 | Temp 97.7°F | Resp 15 | Ht 65.0 in | Wt 143.9 lb

## 2018-02-08 VITALS — BP 129/79

## 2018-02-08 DIAGNOSIS — D509 Iron deficiency anemia, unspecified: Secondary | ICD-10-CM

## 2018-02-08 DIAGNOSIS — C787 Secondary malignant neoplasm of liver and intrahepatic bile duct: Secondary | ICD-10-CM | POA: Diagnosis not present

## 2018-02-08 DIAGNOSIS — C786 Secondary malignant neoplasm of retroperitoneum and peritoneum: Secondary | ICD-10-CM | POA: Diagnosis not present

## 2018-02-08 DIAGNOSIS — G8929 Other chronic pain: Secondary | ICD-10-CM

## 2018-02-08 DIAGNOSIS — I2699 Other pulmonary embolism without acute cor pulmonale: Secondary | ICD-10-CM

## 2018-02-08 DIAGNOSIS — I82412 Acute embolism and thrombosis of left femoral vein: Secondary | ICD-10-CM

## 2018-02-08 DIAGNOSIS — M549 Dorsalgia, unspecified: Secondary | ICD-10-CM | POA: Diagnosis not present

## 2018-02-08 DIAGNOSIS — Z7901 Long term (current) use of anticoagulants: Secondary | ICD-10-CM | POA: Diagnosis not present

## 2018-02-08 DIAGNOSIS — C19 Malignant neoplasm of rectosigmoid junction: Secondary | ICD-10-CM

## 2018-02-08 DIAGNOSIS — C20 Malignant neoplasm of rectum: Secondary | ICD-10-CM

## 2018-02-08 DIAGNOSIS — R600 Localized edema: Secondary | ICD-10-CM | POA: Diagnosis not present

## 2018-02-08 DIAGNOSIS — Z95828 Presence of other vascular implants and grafts: Secondary | ICD-10-CM

## 2018-02-08 LAB — CMP (CANCER CENTER ONLY)
ALBUMIN: 1.9 g/dL — AB (ref 3.5–5.0)
ALK PHOS: 63 U/L (ref 40–150)
ALT: 10 U/L (ref 0–55)
AST: 12 U/L (ref 5–34)
Anion gap: 5 (ref 3–11)
BUN: 9 mg/dL (ref 7–26)
CALCIUM: 7.8 mg/dL — AB (ref 8.4–10.4)
CO2: 26 mmol/L (ref 22–29)
CREATININE: 0.5 mg/dL — AB (ref 0.60–1.10)
Chloride: 107 mmol/L (ref 98–109)
GFR, Est AFR Am: >60 mL/min (ref >60)
GFR, Estimated: >60 mL/min (ref >60)
GLUCOSE: 78 mg/dL (ref 70–140)
Potassium: 4 mmol/L (ref 3.5–5.1)
Sodium: 138 mmol/L (ref 136–145)
Total Bilirubin: <0.2 mg/dL — ABNORMAL LOW (ref 0.2–1.2)
Total Protein: 4.7 g/dL — ABNORMAL LOW (ref 6.4–8.3)

## 2018-02-08 LAB — CBC WITH DIFFERENTIAL (CANCER CENTER ONLY)
Basophils Absolute: 0 10*3/uL (ref 0.0–0.1)
Basophils Relative: 1 %
EOS ABS: 0.4 10*3/uL (ref 0.0–0.5)
Eosinophils Relative: 7 %
HEMATOCRIT: 27.7 % — AB (ref 34.8–46.6)
Hemoglobin: 8.3 g/dL — ABNORMAL LOW (ref 11.6–15.9)
LYMPHS ABS: 1.2 10*3/uL (ref 0.9–3.3)
Lymphocytes Relative: 24 %
MCH: 23.6 pg — AB (ref 25.1–34.0)
MCHC: 30 g/dL — AB (ref 31.5–36.0)
MCV: 78.7 fL — ABNORMAL LOW (ref 79.5–101.0)
MONO ABS: 1.1 10*3/uL — AB (ref 0.1–0.9)
MONOS PCT: 22 %
NEUTROS ABS: 2.4 10*3/uL (ref 1.5–6.5)
Neutrophils Relative %: 46 %
Platelet Count: 474 10*3/uL — ABNORMAL HIGH (ref 145–400)
RBC: 3.52 MIL/uL — ABNORMAL LOW (ref 3.70–5.45)
RDW: 19.7 % — AB (ref 11.2–14.5)
WBC Count: 5.2 10*3/uL (ref 3.9–10.3)

## 2018-02-08 MED ORDER — PALONOSETRON HCL INJECTION 0.25 MG/5ML
0.2500 mg | Freq: Once | INTRAVENOUS | Status: AC
  Administered 2018-02-08: 0.25 mg via INTRAVENOUS

## 2018-02-08 MED ORDER — DEXTROSE 5 % IV SOLN
Freq: Once | INTRAVENOUS | Status: AC
  Administered 2018-02-08: 12:00:00 via INTRAVENOUS

## 2018-02-08 MED ORDER — SODIUM CHLORIDE 0.9 % IJ SOLN
10.0000 mL | INTRAMUSCULAR | Status: DC | PRN
  Administered 2018-02-08: 10 mL via INTRAVENOUS
  Filled 2018-02-08: qty 10

## 2018-02-08 MED ORDER — DEXAMETHASONE SODIUM PHOSPHATE 10 MG/ML IJ SOLN
INTRAMUSCULAR | Status: AC
  Filled 2018-02-08: qty 1

## 2018-02-08 MED ORDER — DEXTROSE 5 % IV SOLN
85.0000 mg/m2 | Freq: Once | INTRAVENOUS | Status: AC
  Administered 2018-02-08: 150 mg via INTRAVENOUS
  Filled 2018-02-08: qty 30

## 2018-02-08 MED ORDER — FLUOROURACIL CHEMO INJECTION 2.5 GM/50ML
400.0000 mg/m2 | Freq: Once | INTRAVENOUS | Status: AC
  Administered 2018-02-08: 700 mg via INTRAVENOUS
  Filled 2018-02-08: qty 14

## 2018-02-08 MED ORDER — DEXAMETHASONE SODIUM PHOSPHATE 10 MG/ML IJ SOLN
10.0000 mg | Freq: Once | INTRAMUSCULAR | Status: AC
  Administered 2018-02-08: 10 mg via INTRAVENOUS

## 2018-02-08 MED ORDER — PALONOSETRON HCL INJECTION 0.25 MG/5ML
INTRAVENOUS | Status: AC
  Filled 2018-02-08: qty 5

## 2018-02-08 MED ORDER — LEUCOVORIN CALCIUM INJECTION 350 MG
400.0000 mg/m2 | Freq: Once | INTRAMUSCULAR | Status: AC
  Administered 2018-02-08: 704 mg via INTRAVENOUS
  Filled 2018-02-08: qty 35.2

## 2018-02-08 MED ORDER — SODIUM CHLORIDE 0.9 % IV SOLN
5.0000 mg/kg | Freq: Once | INTRAVENOUS | Status: AC
  Administered 2018-02-08: 350 mg via INTRAVENOUS
  Filled 2018-02-08: qty 14

## 2018-02-08 MED ORDER — SODIUM CHLORIDE 0.9 % IV SOLN
INTRAVENOUS | Status: DC
  Administered 2018-02-08: 10:00:00 via INTRAVENOUS

## 2018-02-08 MED ORDER — SODIUM CHLORIDE 0.9 % IV SOLN
2400.0000 mg/m2 | INTRAVENOUS | Status: DC
  Administered 2018-02-08: 4200 mg via INTRAVENOUS
  Filled 2018-02-08: qty 84

## 2018-02-08 NOTE — Progress Notes (Signed)
[place] OFFICE PROGRESS NOTE   Diagnosis: Colon cancer  INTERVAL HISTORY:   Carolyn Barton returns as scheduled.  She was able to attend her daughter's graduation over the weekend.  The leg edema remains improved.  No pain.  No neuropathy symptoms.  She reports the skin breakdown at the legs is improving with wound care.  Objective:  Vital signs in last 24 hours:  Blood pressure 123/82, pulse 77, temperature 97.7 F (36.5 C), temperature source Oral, resp. rate 15, height '5\' 5"'$  (1.651 m), weight 143 lb 14.4 oz (65.3 kg), SpO2 100 %.    HEENT: No thrush or ulcers Resp: Lungs clear bilaterally Cardio: Regular rate and rhythm GI: No hepatomegaly, left lower quadrant colostomy Vascular: Tense edema throughout the left greater than right leg  Skin: Wrapping in place at the left lower leg  Portacath/PICC-without erythema  Lab Results:  Lab Results  Component Value Date   WBC 5.2 02/08/2018   HGB 8.3 (L) 02/08/2018   HCT 27.7 (L) 02/08/2018   MCV 78.7 (L) 02/08/2018   PLT 474 (H) 02/08/2018   NEUTROABS 2.4 02/08/2018    CMP     Component Value Date/Time   NA 132 (L) 01/18/2018 0852   NA 139 09/21/2017 0746   K 3.9 01/18/2018 0852   K 3.3 (L) 09/21/2017 0746   CL 104 01/18/2018 0852   CL 101 12/01/2012 0924   CO2 23 01/18/2018 0852   CO2 28 09/21/2017 0746   GLUCOSE 118 01/18/2018 0852   GLUCOSE 92 09/21/2017 0746   GLUCOSE 94 12/01/2012 0924   BUN 12 01/18/2018 0852   BUN 9.3 09/21/2017 0746   CREATININE 0.59 (L) 01/18/2018 0852   CREATININE 0.6 09/21/2017 0746   CALCIUM 8.2 (L) 01/18/2018 0852   CALCIUM 8.7 09/21/2017 0746   PROT 4.9 (L) 01/18/2018 0852   PROT 6.2 (L) 09/21/2017 0746   ALBUMIN 1.5 (L) 01/18/2018 0852   ALBUMIN 3.2 (L) 09/21/2017 0746   AST 11 01/18/2018 0852   AST 19 09/21/2017 0746   ALT 8 01/18/2018 0852   ALT 12 09/21/2017 0746   ALKPHOS 67 01/18/2018 0852   ALKPHOS 105 09/21/2017 0746   BILITOT <0.2 (L) 01/18/2018 0852    BILITOT 0.41 09/21/2017 0746   GFRNONAA >60 01/18/2018 0852   GFRAA >60 01/18/2018 0852    Lab Results  Component Value Date   CEA1 2.29 01/04/2018     Medications: I have reviewed the patient's current medications.   Assessment/Plan: 1.Metastatic colorectal cancer-status post an exploratory laparotomy 03/03/2016 revealing a proximal rectal mass, peritoneal carcinomatosis, and liver metastases  Biopsy of peritoneal nodules 03/03/2016 confirmed metastatic adenocarcinoma consistent with a colon primary  Foundation 1 testing-MSI-stable, tumor mutation burden-low, no BRAF NRAS or KRAS mutation  Cycle 1 FOLFIRI/PANITUMUMAB 04/14/2016  Cycle 2 FOLFIRI/panitumumab 04/28/2016  Cycle 3 FOLFIRI/panitumumab 05/12/2016  Cycle 4 FOLFIRI/panitumumab 05/27/2016  Cycle 5 FOLFIRI/panitumumab 06/09/2016  Restaging CT abdomen/pelvis 06/20/2016-no evidence of disease progression, decreased left hepatic lesion  Cycle 6 FOLFIRI/panitumumab 06/23/2016  Cycle 7 FOLFIRI/panitumumab 07/07/2016  Cycle 8 FOLFIRI/panitumumab 07/21/2016  Cycle 9 FOLFIRI/PANITUMUMAB 08/04/2016  Cycle 10 FOLFIRI/panitumumab 08/18/2016  Restaging CT 08/29/2016 with interval decrease in the size of the medial segment left liver lesion. Lesion identified previously in the rectosigmoid colon not evident on the current study.  5-FU/PANITUMUMAB 09/01/2016  Xeloda 7 days on/7 days off and PANITUMUMAB every 3 weeks 09/15/2016 (awaiting insurance approval for Xeloda 09/15/2016)  CT abdomen/pelvis 12/25/2016-unchanged 5 mm right hepatic lesion, resolution of  medial segment left liver lesion, no new liver lesion. Residual soft tissue fullness in the sigmoid , persistent distal esophageal wall thickening  Continuationof PANITUMUMAB every 3 weeks and Xeloda 7 days on/7 days off  CT 06/19/2017-new cystic/solid lesion in the left adnexa  Xeloda/panitumumab continued  CT 10/08/2016-progression of cystic pelvic  mass  Cycle 1 FOLFIRI/Panitumumab 10/21/2017  Cystic pelvic mass aspirated 10/30/2017  Cycle 2 FOLFIRI/panitumumab 11/09/2017  Cycle 3 FOLFIRI/panitumumab 11/23/2017  Cycle 4 FOLFIRI/Panitumumab 12/07/2017  Cycle 5 FOLFIRI/panitumumab 12/21/2017  CT pelvis 12/23/2017- complex cystic pelvic mass with mass-effect on the pelvic vasculature  CT aspiration of cystic pelvic mass 12/23/2017  Cycle 1 FOLFOX/Avastin 01/04/2018  Cycle 2 FOLFOX/Avastin 01/18/2018  Cycle 3 FOLFOX/Avastin 02/08/2018  2.History of aBowel obstruction secondary to #1  3. Chronic back pain-maintained on methadone  4. Essential thrombocytosis  5. Early obstruction of the left ureter noted at the time of surgery 03/03/2016-Dr. Tannenbaum placed a left double-J stent 03/18/2016  New onset right hydronephrosis 10/11/2017  Right ureter stent placement 10/13/2017  6. Abdominal wall cellulitis 03/10/2016, blood cultures positive for coagulase negative staphylococcus-methicillin-resistant, status post treatment with vancomycin and Bactrim   8. Port-A-Cath placement 04/10/2016, interventional radiology  9. Iron deficiency anemia. Feraheme 11/05/2015 , 11/12/2015,and 04/14/2016-persistent anemia and red cell microcytosis, oral iron resumed 11/17/2016-stable  10. Pain/tenderness left posterior iliac region-resolved  11. Hypokalemia-started on potassium replacement 06/09/2016  12. History of skin rashand paronychiasecondary to [facility], she continues minocycline, moisturizers, and fluticasone as needed  13.Acute onset right low back pain 10/11/2017-likely secondary to obstruction of the right kidney;left ureter stent exchange and right ureter stent placement 10/13/2017  14.Left leg pain and edema-bilateral lower extremity venous Doppler negative for DVT1/15/2019 and 10/19/2017. Left common femoral DVT noted on CT venogram 10/27/2017;CT aspiration of pelvic cystic mass 10/30/2017 with  subsequent marked improvement of leg edema.  15. Admission 10/22/2017 with bilateral pulmonary embolism, heparin drip initiated, converted to Lovenox 10/23/2017, converted to Xarelto during the 10/27/2017 hospital admission  16. Admission 10/27/2017 with increased back/leg pain and a fever-blood and urine cultures positive fora resistant E. coli   [name] appears stable.  The plan is to proceed with another cycle of FOLFOX/Avastin today.  She will return for an office visit in the next cycle of chemotherapy in 3 weeks. The plan is to schedule a restaging CT evaluation after 5 cycles of FOLFOX/Avastin.  [name] Coder, MD  02/08/2018  10:16 AM

## 2018-02-08 NOTE — Telephone Encounter (Signed)
Scheduled appt per 5/13 los - patient is aware of appt date and time - no print out wanted per patient request - my chart active.

## 2018-02-08 NOTE — Progress Notes (Signed)
[place] Spiritual Care Note  Followed up with Carolyn Barton in infusion. She was joyful and relieved that she was able to celebrate her younger daughter Carolyn Barton's graduation from [employer] with family this weekend, has a self-care/rest plan for the next couple of days after tx, and then awaits small daily hightlights followed by a quick mother-daughter beach trip next week. Keeping up with family and friends is a core of Carolyn Barton's meaning-making and enjoyment. She reports much relief now that the edema in her legs is better managed, affording her more comfort and mobility.  \We plan to connect when she is on campus, and she knows to contact [place] whenever desired, but please also page if immediate needs arise or circumstances change. Thank you.  [place], [place], [facility] Pager [phone] Voicemail [phone]

## 2018-02-08 NOTE — Patient Instructions (Signed)
Implanted Port Home Guide An implanted port is a type of central line that is placed under the skin. Central lines are used to provide IV access when treatment or nutrition needs to be given through a person's veins. Implanted ports are used for long-term IV access. An implanted port may be placed because:  You need IV medicine that would be irritating to the small veins in your hands or arms.  You need long-term IV medicines, such as antibiotics.  You need IV nutrition for a long period.  You need frequent blood draws for lab tests.  You need dialysis.  Implanted ports are usually placed in the chest area, but they can also be placed in the upper arm, the abdomen, or the leg. An implanted port has two main parts:  Reservoir. The reservoir is round and will appear as a small, raised area under your skin. The reservoir is the part where a needle is inserted to give medicines or draw blood.  Catheter. The catheter is a thin, flexible tube that extends from the reservoir. The catheter is placed into a large vein. Medicine that is inserted into the reservoir goes into the catheter and then into the vein.  How will I care for my incision site? Do not get the incision site wet. Bathe or shower as directed by your health care provider. How is my port accessed? Special steps must be taken to access the port:  Before the port is accessed, a numbing cream can be placed on the skin. This helps numb the skin over the port site.  Your health care provider uses a sterile technique to access the port. ? Your health care provider must put on a mask and sterile gloves. ? The skin over your port is cleaned carefully with an antiseptic and allowed to dry. ? The port is gently pinched between sterile gloves, and a needle is inserted into the port.  Only "non-coring" port needles should be used to access the port. Once the port is accessed, a blood return should be checked. This helps ensure that the port  is in the vein and is not clogged.  If your port needs to remain accessed for a constant infusion, a clear (transparent) bandage will be placed over the needle site. The bandage and needle will need to be changed every week, or as directed by your health care provider.  Keep the bandage covering the needle clean and dry. Do not get it wet. Follow your health care provider's instructions on how to take a shower or bath while the port is accessed.  If your port does not need to stay accessed, no bandage is needed over the port.  What is flushing? Flushing helps keep the port from getting clogged. Follow your health care provider's instructions on how and when to flush the port. Ports are usually flushed with saline solution or a medicine called heparin. The need for flushing will depend on how the port is used.  If the port is used for intermittent medicines or blood draws, the port will need to be flushed: ? After medicines have been given. ? After blood has been drawn. ? As part of routine maintenance.  If a constant infusion is running, the port may not need to be flushed.  How long will my port stay implanted? The port can stay in for as long as your health care provider thinks it is needed. When it is time for the port to come out, surgery will be   done to remove it. The procedure is similar to the one performed when the port was put in. When should I seek immediate medical care? When you have an implanted port, you should seek immediate medical care if:  You notice a bad smell coming from the incision site.  You have swelling, redness, or drainage at the incision site.  You have more swelling or pain at the port site or the surrounding area.  You have a fever that is not controlled with medicine.  This information is not intended to replace advice given to you by your health care provider. Make sure you discuss any questions you have with your health care provider. Document  Released: 09/15/2005 Document Revised: 02/21/2016 Document Reviewed: 05/23/2013 Elsevier Interactive Patient Education  2017 [employer]  

## 2018-02-08 NOTE — Patient Instructions (Signed)
[place] Discharge Instructions for Patients Receiving Chemotherapy  Today you received the following chemotherapy agents Oxaliplatin, Leucovorin, 5FU & Avastin. To help prevent nausea and vomiting after your treatment, we encourage you to take your nausea medication as directed   If you develop nausea and vomiting that is not controlled by your nausea medication, call the clinic.   BELOW ARE SYMPTOMS THAT SHOULD BE REPORTED IMMEDIATELY:  *FEVER GREATER THAN 100.5 F  *CHILLS WITH OR WITHOUT FEVER  NAUSEA AND VOMITING THAT IS NOT CONTROLLED WITH YOUR NAUSEA MEDICATION  *UNUSUAL SHORTNESS OF BREATH  *UNUSUAL BRUISING OR BLEEDING  TENDERNESS IN MOUTH AND THROAT WITH OR WITHOUT PRESENCE OF ULCERS  *URINARY PROBLEMS  *BOWEL PROBLEMS  UNUSUAL RASH Items with * indicate a potential emergency and should be followed up as soon as possible.  Feel free to call the clinic should you have any questions or concerns. The clinic phone number is [phone].  Please show the [place] at check-in to the Emergency Department and triage nurse.   Blood Transfusion, Adult A blood transfusion is a procedure in which you receive donated blood, including plasma, platelets, and red blood cells, through an IV tube. You may need a blood transfusion because of illness, surgery, or injury. The blood may come from a donor. You may also be able to donate blood for yourself (autologous blood donation) before a surgery if you know that you might require a blood transfusion. The blood given in a transfusion is made up of different types of cells. You may receive:  Red blood cells. These carry oxygen to the cells in the body.  White blood cells. These help you fight infections.  Platelets. These help your blood to clot.  Plasma. This is the liquid part of your blood and it helps with fluid imbalances.  If you have hemophilia or another clotting disorder, you may also receive  other types of blood products. Tell a health care provider about:  Any allergies you have.  All medicines you are taking, including vitamins, herbs, eye drops, creams, and over-the-counter medicines.  Any problems you or family members have had with anesthetic medicines.  Any blood disorders you have.  Any surgeries you have had.  Any medical conditions you have, including any recent fever or cold symptoms.  Whether you are pregnant or may be pregnant.  Any previous reactions you have had during a blood transfusion. What are the risks? Generally, this is a safe procedure. However, problems may occur, including:  Having an allergic reaction to something in the donated blood. Hives and itching may be symptoms of this type of reaction.  Fever. This may be a reaction to the white blood cells in the transfused blood. Nausea or chest pain may accompany a fever.  Iron overload. This can happen from having many transfusions.  Transfusion-related acute lung injury (TRALI). This is a rare reaction that causes lung damage. The cause is not known.TRALI can occur within hours of a transfusion or several days later.  Sudden (acute) or delayed hemolytic reactions. This happens if your blood does not match the cells in your transfusion. Your body's defense system (immune system) may try to attack the new cells. This complication is rare. The symptoms include fever, chills, nausea, and low back pain or chest pain.  Infection or disease transmission. This is rare.  What happens before the procedure?  You will have a blood test to determine your blood type. This is necessary to know  what kind of blood your body will accept and to match it to the donor blood.  If you are going to have a planned surgery, you may be able to do an autologous blood donation. This may be done in case you need to have a transfusion.  If you have had an allergic reaction to a transfusion in the past, you may be given  medicine to help prevent a reaction. This medicine may be given to you by mouth or through an IV tube.  You will have your temperature, blood pressure, and pulse monitored before the transfusion.  Follow instructions from your health care provider about eating and drinking restrictions.  Ask your health care provider about: ? Changing or stopping your regular medicines. This is especially important if you are taking diabetes medicines or blood thinners. ? Taking medicines such as aspirin and ibuprofen. These medicines can thin your blood. Do not take these medicines before your procedure if your health care provider instructs you not to. What happens during the procedure?  An IV tube will be inserted into one of your veins.  The bag of donated blood will be attached to your IV tube. The blood will then enter through your vein.  Your temperature, blood pressure, and pulse will be monitored regularly during the transfusion. This monitoring is done to detect early signs of a transfusion reaction.  If you have any signs or symptoms of a reaction, your transfusion will be stopped and you may be given medicine.  When the transfusion is complete, your IV tube will be removed.  Pressure may be applied to the IV site for a few minutes.  A bandage (dressing) will be applied. The procedure may vary among health care providers and hospitals. What happens after the procedure?  Your temperature, blood pressure, heart rate, breathing rate, and blood oxygen level will be monitored often.  Your blood may be tested to see how you are responding to the transfusion.  You may be warmed with fluids or blankets to maintain a normal body temperature. Summary  A blood transfusion is a procedure in which you receive donated blood, including plasma, platelets, and red blood cells, through an IV tube.  Your temperature, blood pressure, and pulse will be monitored before, during, and after the  transfusion.  Your blood may be tested after the transfusion to see how your body has responded. This information is not intended to replace advice given to you by your health care provider. Make sure you discuss any questions you have with your health care provider. Document Released: 09/12/2000 Document Revised: 06/12/2016 Document Reviewed: 06/12/2016 Elsevier Interactive Patient Education  [employer].

## 2018-02-08 NOTE — Progress Notes (Signed)
MD not really following Urine Prot since pt has urinary stents in place.  He will follow [facility] closely. Kennith Center, Pharm.D., CPP 02/08/2018@10 :40 AM

## 2018-02-10 ENCOUNTER — Inpatient Hospital Stay: Payer: BLUE CROSS/BLUE SHIELD

## 2018-02-10 ENCOUNTER — Ambulatory Visit: Payer: BLUE CROSS/BLUE SHIELD

## 2018-02-10 VITALS — BP 128/69 | HR 77 | Temp 98.2°F | Resp 16

## 2018-02-10 DIAGNOSIS — I890 Lymphedema, not elsewhere classified: Secondary | ICD-10-CM

## 2018-02-10 DIAGNOSIS — C20 Malignant neoplasm of rectum: Secondary | ICD-10-CM

## 2018-02-10 DIAGNOSIS — Z95828 Presence of other vascular implants and grafts: Secondary | ICD-10-CM

## 2018-02-10 DIAGNOSIS — D509 Iron deficiency anemia, unspecified: Secondary | ICD-10-CM

## 2018-02-10 MED ORDER — HEPARIN SOD (PORK) LOCK FLUSH 100 UNIT/ML IV SOLN
500.0000 [IU] | Freq: Once | INTRAVENOUS | Status: AC | PRN
  Administered 2018-02-10: 500 [IU] via INTRAVENOUS
  Filled 2018-02-10: qty 5

## 2018-02-10 MED ORDER — SODIUM CHLORIDE 0.9 % IJ SOLN
10.0000 mL | INTRAMUSCULAR | Status: DC | PRN
Start: 2018-02-10 — End: 2018-02-10
  Administered 2018-02-10: 10 mL via INTRAVENOUS
  Filled 2018-02-10: qty 10

## 2018-02-10 NOTE — Therapy (Signed)
[place], [place], [address] Phone: [phone]   Fax:  [phone]  Physical Therapy Treatment  Patient Details  Name: Carolyn Barton MRN: [id number] Date of Birth: 27-Jan-1956 Referring Provider: Dr. Benay Spice    Encounter Date: 02/10/2018  PT End of Session - 02/10/18 1025    Visit Number  11    Number of Visits  17    Date for PT Re-Evaluation  03/03/18    PT Start Time  0851    PT Stop Time  0935    PT Time Calculation (min)  44 min    Activity Tolerance  Patient tolerated treatment well    Behavior During Therapy  [facility] for tasks assessed/performed       Past Medical History:  Diagnosis Date  . Abdominal wall cellulitis 03/10/2016  . Anxiety   . Ascites 10/27/2017   Small volume noted on CT  . Aspiration of liquid 12/23/2017   pelvic cyst/mass  . Bowel obstruction ([place])   . Chronic back pain    lumbar    . Colostomy in place [facility])   . Depression   . DVT (deep venous thrombosis) ([place]) 10/27/2017   Non occlusive left common femoral vein  . Dyspnea   . 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 GERD (gastroesophageal reflux disease)   . Grade I diastolic dysfunction 94/85/4627   ECHO   . History of chemotherapy last chemo 09-21-17  . History of hiatal hernia   . History of pelvic mass 11/2017   cyst  . History of pulmonary embolism 09/2017   Bilateral  . Hydronephrosis    right  . Hypokalemia    takes potassium  . Iron deficiency anemia 04/14/2016   treated w/ Iron infusions  . Liver metastasis ([place])    secondary to colorectal cancer  . Lower leg edema   . Metastatic colorectal cancer [facility]) dx 03/03/2016--- oncologist-- dr Benay Spice   metastatic colorectal carcinoma--  s/p  exp. lap. for bowel obstruction--  rectal mass, peritoneal carcinomatosis, liver mets---  chemotherapy  . Scoliosis   . Skin rash    on legs seeing wound care specialist  . Spinal  headache 12/21/2012  . Tachycardia    persistant since discharged from hospital 06/ 2017 due to anemia and deconditioning;  as of 09-02-2016 per pt no issues w/ heart racing in the past few weeks  . Ureteral obstruction, left   . Wears contact lenses     Past Surgical History:  Procedure Laterality Date  . BIOPSY N/A 03/03/2016   Procedure: BIOPSY OF PERITONEAL NODULE;  Surgeon: Erroll Luna, MD;  Location: [facility];  Service: General;  Laterality: N/A;  . COLOSTOMY N/A 03/03/2016   Procedure: DIVERTING SIGMOID COLOSTOMY;  Surgeon: Erroll Luna, MD;  Location: [place];  Service: General;  Laterality: N/A;  . CYSTOSCOPY W/ RETROGRADES Right 10/13/2017   Procedure: CYSTOSCOPY WITH RETROGRADE PYELOGRAM/ STENT PLACEMENT;  Surgeon: Ceasar Mons, MD;  Location: [facility];  Service: Urology;  Laterality: Right;  ONLY NEEDS 30 MIN FOR BOTH PROCEDURES  . CYSTOSCOPY W/ URETERAL STENT PLACEMENT Left 10/09/2016   Procedure: CYSTOSCOPY WITH LEFT STENT REPLACEMENT 65F POLARIS STENT 24CM;  Surgeon: Carolan Clines, MD;  Location: [facility];  Service: Urology;  Laterality: Left;  . CYSTOSCOPY W/ URETERAL STENT PLACEMENT Left 03/16/2017   Procedure: CYSTOSCOPY WITH RETROGRADE PYELOGRAM WITH LEFT  STENT REMOVAL AND REPLACEMENT;  Surgeon: Carolan Clines, MD;  Location: [place];  Service: Urology;  Laterality: Left;  . CYSTOSCOPY W/ URETERAL STENT PLACEMENT Left 10/13/2017   Procedure: CYSTOSCOPY WITH STENT REPLACEMENT;  Surgeon: Ceasar Mons, MD;  Location: [facility];  Service: Urology;  Laterality: Left;  . CYSTOSCOPY WITH FULGERATION N/A 10/13/2017   Procedure: CYSTOSCOPY WITH FULGERATION;  Surgeon: Ceasar Mons, MD;  Location: [facility];  Service: Urology;  Laterality: N/A;  . CYSTOSCOPY WITH RETROGRADE PYELOGRAM, URETEROSCOPY AND STENT PLACEMENT Left 03/18/2016   Procedure: CYSTOSCOPY  WITH LEFT  RETROGRADE PYELOGRAM,  AND  POLARIS STENT PLACEMENT;  Surgeon: Carolan Clines, MD;  Location: [facility];  Service: Urology;  Laterality: Left;  . LAPAROTOMY N/A 03/03/2016   Procedure: EXPLORATORY LAPAROTOMY;  Surgeon: Erroll Luna, MD;  Location: [place];  Service: General;  Laterality: N/A;  . LUMBAR LAMINECTOMY  11/11/2012   L4-5  and Resection synovial cyst  . MAXIMUM ACCESS (MAS)POSTERIOR LUMBAR INTERBODY FUSION (PLIF) 1 LEVEL N/A 01/11/2014   Procedure: FOR MAXIMUM ACCESS (MAS) POSTERIOR LUMBAR INTERBODY FUSION Lumbar Five Sacral One;  Surgeon: [name], MD;  Location: [facility];  Service: Neurosurgery;  Laterality: N/A;  FOR MAXIMUM ACCESS (MAS) POSTERIOR LUMBAR INTERBODY FUSION Lumbar Five Sacral One  . PORTACATH PLACEMENT  04/10/2016  . REPAIR CEREBROSPINAL FLUID LEAK POST RESECTION SYNOVIAL CYST  12/21/2012  . REVISION LUMBAR HARDWARE  01/26/2014  . TRANSTHORACIC ECHOCARDIOGRAM  06/17/2016   grade 1 diastolic function , ef 25-49%/  trivial TR  . WISDOM TOOTH EXTRACTION      There were no vitals filed for this visit.  Subjective Assessment - 02/10/18 0853    Subjective  I haven't heard anything about home health yet. My daughter graduated from college this weekend and I was able to go to all the activities. My Lt leg is bothering me some this morning, but I'm guessing that's from being on it so much this weekend.     Pertinent History  Colon cancer diagnosed in June 2017 with abdominal surgery with colostomy, and chemotherapy now with complex cystic mass in central pelvis with mass effect on veins bilaerally.  she recently has an aspiration of 550 cc of fluid from mass that did not improve lymphedema in legs.  She has been elevating them with little benefit.     Patient Stated Goals  to get help with decreasing her swelling. " I want to get rid of all this oozing"     Currently in Pain?  Yes    Pain Score  4     Pain Location  Leg    Pain Orientation  Left    Pain  Descriptors / Indicators  Aching    Pain Type  Chronic pain    Pain Onset  In the past 7 days    Aggravating Factors   Probably just being up more than usual this weekend    Pain Relieving Factors  Keeping bandages changed            LYMPHEDEMA/ONCOLOGY QUESTIONNAIRE - 02/10/18 0904      Right Lower Extremity Lymphedema   10 cm Proximal to Suprapatella  48.2 cm    At Midpatella/Popliteal Crease  42.6 cm    30 cm Proximal to Floor at Lateral Plantar Foot  39.6 cm    20 cm Proximal to Floor at Lateral Plantar Foot  34._0 cm Proximal to Floor at Lateral Malleoli  25.1 cm    5 cm  Proximal to 1st MTP Joint  23.5 cm    Across MTP Joint  22.4 cm    Around Proximal Great Toe  8.1 cm      Left Lower Extremity Lymphedema   10 cm Proximal to Suprapatella  50.2 cm    At Midpatella/Popliteal Crease  44.2 cm    30 cm Proximal to Floor at Lateral Plantar Foot  39.1 cm tissue over wounds    20 cm Proximal to Floor at Lateral Plantar Foot  33.7 cm tissue over wounds    10 cm Proximal to Floor at Lateral Malleoli  27.9 cm    5 cm Proximal to 1st MTP Joint  23.2 cm    Across MTP Joint  22.9 cm    Around Proximal Great Toe  7.9 cm           [facility]patient [facility]01/[facility]  Lymphedema Life Impact Scale Total Score  35.29 %           [facility] Adult PT Treatment/Exercise - 02/10/18 0001      Manual Therapy   Edema Management  Applied velcro compression garment to Lt LE after trimming garment to accomodate for reductions attained. She did not bring extra velcro strap for other garment so just applied lotion and thick stockinette here, they are to don that garment when they get home.     Manual Lymphatic Drainage (MLD)  In Supine with HOB elevated: Short neck, 5 diaphragamtic breaths, then Lt axillary nodes, Lt inguino-axillary anastomosis and Lt upper leg, knee, then retraced steps.     Compression Bandaging  lotion applied to  posterior Rt leg; to Lt: three Telfa pads, one abd pad and Artiflex applied at left leg, kept in place by thick stockinette, then velcro compression garment appied.                  PT Short Term Goals - 01/21/18 1412      PT SHORT TERM GOAL #1   Title  Pt and husband have a way to manage lymphedema at home with MLD and compression     Status  On-going      PT SHORT TERM GOAL #2   Title  Pt will decrease left leg circumference to 28 cm at 10 cm proximal to lateral foot     Status  Achieved        PT Long Term Goals - 02/03/18 1533      PT LONG TERM GOAL #1   Title  Pt will report she is able to independently manage lymphedema at home    Baseline  Pt has lift chair now so elevates her legs more frequently than was able to before, also has velcro compression garments to wear at home-02/03/18    Status  Achieved      PT LONG TERM GOAL #2   Title  Pt willi increase her hip flexor strengthto 3+/5 so that she is  able to lift her leg so that she can get in and out of the car by herself     Baseline  Pt reports this much improved, just needs min A intermittently as leg swelling has improved (Rt LE 3-/5, Lt LE 3/5)-02/03/18    Status  On-going      PT LONG TERM GOAL #3   Title  Pt will be able to walk > 100 feet independently without device so that she can manage better at home  Baseline  50 feet with min assist; pt and daugther report pt is walking more than 100 ft now without assist-02/03/18    Status  Achieved            Plan - 02/10/18 1029    Clinical Impression Statement  Pts wound continue to be on the mend. Pts tenderness has greatly improved over the past 2 weeks and her drainage has diminshed considerably. Pt and her husband reports they have been changing bandages daily, but she hasn't been as compliant with wearing her velcro garments recently, partly due to increased activity this weekend with daughter graduating from college. So encouraged her that;s okay but  now she has time again so should be wearing them as able and they verbalized understanding. Discussed with husband and pt (after brief discussion with Maudry Diego, PT) possibility of pt being on hold with [place] until home health begins as they are maintaining her condition well and her lymphedema is doing well also per circumference measurements taken today (were reduced from a few weeks ago even with not wearing them this weekend) and their reports of doing well with managing at home. She has one more appointment scheduled for next Friday and would like to keep that until she knows for sure home health is lined up. Also instructed pt that they could always come back to therapy in future, with a new doctors order, if needed for strengthening once chemo isn't as frequent. Pt and husband very agreeable to this plan.     Clinical Impairments Affecting Rehab Potential  Pelvic mass causing venous constriction     PT Frequency  2x / week    PT Duration  8 weeks    PT Treatment/Interventions  ADLs/Self Care Home Management;DME Instruction;Therapeutic exercise;Therapeutic activities;Patient/family education;Manual lymph drainage;Compression bandaging;Manual techniques    PT Next Visit Plan  Pt has one more scheduled visit but plans to call and cancel if home health begins, then will D/C at that time. If pt returns for another visit continue observation of wounds (see pic from todays visit), assessing compliance at home with changing bandages and wearing velcro reduction kit garments to bil LE's, and MLD.     Consulted and Agree with Plan of Care  Patient    Family Member Consulted  Husband       Patient will benefit from skilled therapeutic intervention in order to improve the following deficits and impairments:  Decreased knowledge of use of DME, Decreased skin integrity, Postural dysfunction, Increased edema, Decreased knowledge of precautions, Decreased strength  Visit Diagnosis: Lymphedema, not elsewhere  classified     Problem List Patient Active Problem List   Diagnosis Date Noted  . Goals of care, counseling/discussion 12/30/2017  . Palliative care encounter   . Neoplasm related pain   . Tachycardia 10/27/2017  . Urinary retention 10/27/2017  . Lower urinary tract infectious disease 10/27/2017  . Chemotherapy-induced neutropenia ([place]) 10/27/2017  . UTI (urinary tract infection) 10/27/2017  . Left femoral vein DVT ([place]) 10/27/2017  . Sepsis ([place]) 10/27/2017  . Acute pyelonephritis 10/27/2017  . Chronic pain 10/23/2017  . Acute pulmonary embolism ([place]) 10/23/2017  . Bilateral pulmonary embolism ([place]) 10/22/2017  . Port-A-Cath in place 07/27/2017  . Port catheter in place 04/14/2016  . Iron deficiency anemia 04/14/2016  . Rectal cancer ([place]) 03/20/2016  . Leukocytosis   . Cellulitis 03/10/2016  . Malnutrition of moderate degree 03/07/2016  . Metastatic colorectal cancer ([place]) 03/03/2016  . Bowel obstruction ([place]) 02/29/2016  . Mass  of colon 02/29/2016  . Nausea & vomiting 02/29/2016  . Lumbar radiculopathy 01/26/2014  . Essential thrombocytosis ([place]) 11/10/2011    Otelia Limes, PTA 02/10/2018, 10:39 AM  [place], [place], [address] Phone: [phone]   Fax:  [phone]  Name: Carolyn Barton MRN: [id number] Date of Birth: [birth year]

## 2018-02-16 ENCOUNTER — Telehealth: Payer: Self-pay

## 2018-02-16 NOTE — Telephone Encounter (Signed)
Call from [facility] with surgery scheduling at [facility] to ask permission from MD for pt to hold Xarelto 2-3 days prior to [facility] stent surgery. Per Dr. Benay Spice, ok to hold Xarelto. LVM with Marlowe Kays.

## 2018-02-19 ENCOUNTER — Ambulatory Visit: Payer: BLUE CROSS/BLUE SHIELD | Admitting: Physical Therapy

## 2018-02-19 ENCOUNTER — Other Ambulatory Visit: Payer: Self-pay

## 2018-02-19 ENCOUNTER — Encounter: Payer: Self-pay | Admitting: Physical Therapy

## 2018-02-19 DIAGNOSIS — I890 Lymphedema, not elsewhere classified: Secondary | ICD-10-CM | POA: Diagnosis not present

## 2018-02-19 DIAGNOSIS — L03116 Cellulitis of left lower limb: Secondary | ICD-10-CM

## 2018-02-19 NOTE — Progress Notes (Signed)
Called in referral to Anabel Halon with [facility]

## 2018-02-19 NOTE — Therapy (Addendum)
[place], [place], [address] Phone: [phone]   Fax:  [phone]  Physical Therapy Treatment  Patient Details  Name: Carolyn Barton MRN: [id number] Date of Birth: May 21, 1956 Referring Provider: Dr. Benay Spice    Encounter Date: 02/19/2018  PT End of Session - 02/19/18 1219    Visit Number  12    Number of Visits  17    Date for PT Re-Evaluation  03/03/18    PT Start Time  0805    PT Stop Time  0900    PT Time Calculation (min)  55 min    Activity Tolerance  Patient tolerated treatment well    Behavior During Therapy  [facility] for tasks assessed/performed       Past Medical History:  Diagnosis Date  . Abdominal wall cellulitis 03/10/2016  . Anxiety   . Ascites 10/27/2017   Small volume noted on CT  . Aspiration of liquid 12/23/2017   pelvic cyst/mass  . Bowel obstruction ([place])   . Chronic back pain    lumbar    . Colostomy in place [facility])   . Depression   . DVT (deep venous thrombosis) ([place]) 10/27/2017   Non occlusive left common femoral vein  . Dyspnea   . 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 GERD (gastroesophageal reflux disease)   . Grade I diastolic dysfunction 70/62/3762   ECHO   . History of chemotherapy last chemo 09-21-17  . History of hiatal hernia   . History of pelvic mass 11/2017   cyst  . History of pulmonary embolism 09/2017   Bilateral  . Hydronephrosis    right  . Hypokalemia    takes potassium  . Iron deficiency anemia 04/14/2016   treated w/ Iron infusions  . Liver metastasis ([place])    secondary to colorectal cancer  . Lower leg edema   . Metastatic colorectal cancer [facility]) dx 03/03/2016--- oncologist-- dr Benay Spice   metastatic colorectal carcinoma--  s/p  exp. lap. for bowel obstruction--  rectal mass, peritoneal carcinomatosis, liver mets---  chemotherapy  . Scoliosis   . Skin rash    on legs seeing wound care specialist  . Spinal  headache 12/21/2012  . Tachycardia    persistant since discharged from hospital 06/ 2017 due to anemia and deconditioning;  as of 09-02-2016 per pt no issues w/ heart racing in the past few weeks  . Ureteral obstruction, left   . Wears contact lenses     Past Surgical History:  Procedure Laterality Date  . BIOPSY N/A 03/03/2016   Procedure: BIOPSY OF PERITONEAL NODULE;  Surgeon: Erroll Luna, MD;  Location: [place];  Service: General;  Laterality: N/A;  . COLOSTOMY N/A 03/03/2016   Procedure: DIVERTING SIGMOID COLOSTOMY;  Surgeon: Erroll Luna, MD;  Location: [place];  Service: General;  Laterality: N/A;  . CYSTOSCOPY W/ RETROGRADES Right 10/13/2017   Procedure: CYSTOSCOPY WITH RETROGRADE PYELOGRAM/ STENT PLACEMENT;  Surgeon: Ceasar Mons, MD;  Location[facility];  Service: Urology;  Laterality: Right;  ONLY NEEDS 30 MIN FOR BOTH PROCEDURES  . CYSTOSCOPY W/ URETERAL STENT PLACEMENT Left 10/09/2016   Procedure: CYSTOSCOPY WITH LEFT STENT REPLACEMENT 39F POLARIS STENT 24CM;  Surgeon: Carolan Clines, MD;  Location: [facility];  Service: Urology;  Laterality: Left;  . CYSTOSCOPY W/ URETERAL STENT PLACEMENT Left 03/16/2017   Procedure: CYSTOSCOPY WITH RETROGRADE PYELOGRAM WITH LEFT  STENT REMOVAL AND REPLACEMENT;  Surgeon: Carolan Clines, MD;  Location: [place];  Service: Urology;  Laterality: Left;  . CYSTOSCOPY W/ URETERAL STENT PLACEMENT Left 10/13/2017   Procedure: CYSTOSCOPY WITH STENT REPLACEMENT;  Surgeon: Ceasar Mons, MD;  [facility];  Service: Urology;  Laterality: Left;  . CYSTOSCOPY WITH FULGERATION N/A 10/13/2017   Procedure: CYSTOSCOPY WITH FULGERATION;  Surgeon: Ceasar Mons, MD;  Location: [facility];  Service: Urology;  Laterality: N/A;  . CYSTOSCOPY WITH RETROGRADE PYELOGRAM, URETEROSCOPY AND STENT PLACEMENT Left 03/18/2016   Procedure: CYSTOSCOPY  WITH LEFT  RETROGRADE PYELOGRAM,  AND  POLARIS STENT PLACEMENT;  Surgeon: Carolan Clines, MD;  Location: [facility];  Service: Urology;  Laterality: Left;  . LAPAROTOMY N/A 03/03/2016   Procedure: EXPLORATORY LAPAROTOMY;  Surgeon: Erroll Luna, MD;  Location: [place];  Service: General;  Laterality: N/A;  . LUMBAR LAMINECTOMY  11/11/2012   L4-5  and Resection synovial cyst  . MAXIMUM ACCESS (MAS)POSTERIOR LUMBAR INTERBODY FUSION (PLIF) 1 LEVEL N/A 01/11/2014   Procedure: FOR MAXIMUM ACCESS (MAS) POSTERIOR LUMBAR INTERBODY FUSION Lumbar Five Sacral One;  Surgeon: [name], MD;  Location: [facility];  Service: Neurosurgery;  Laterality: N/A;  FOR MAXIMUM ACCESS (MAS) POSTERIOR LUMBAR INTERBODY FUSION Lumbar Five Sacral One  . PORTACATH PLACEMENT  04/10/2016  . REPAIR CEREBROSPINAL FLUID LEAK POST RESECTION SYNOVIAL CYST  12/21/2012  . REVISION LUMBAR HARDWARE  01/26/2014  . TRANSTHORACIC ECHOCARDIOGRAM  06/17/2016   grade 1 diastolic function , ef 94-76%/  trivial TR  . WISDOM TOOTH EXTRACTION      There were no vitals filed for this visit.  Subjective Assessment - 02/19/18 0806    Subjective  My leg is so much better now. I have not heard from home health. I am good about changing the bandages every day. My left leg is still leaking some.     Pertinent History  Colon cancer diagnosed in June 2017 with abdominal surgery with colostomy, and chemotherapy now with complex cystic mass in central pelvis with mass effect on veins bilaerally.  she recently has an aspiration of 550 cc of fluid from mass that did not improve lymphedema in legs.  She has been elevating them with little benefit.     Patient Stated Goals  to get help with decreasing her swelling. " I want to get rid of all this oozing"     Currently in Pain?  No/denies    Pain Score  0-No pain            LYMPHEDEMA/ONCOLOGY QUESTIONNAIRE - 02/19/18 0808      Left Lower Extremity Lymphedema   10 cm Proximal to Suprapatella  51  cm    At Midpatella/Popliteal Crease  45 cm    30 cm Proximal to Floor at Lateral Plantar Foot  39.8 cm    20 cm Proximal to Floor at Lateral Plantar Foot  33.8 cm    10 cm Proximal to Floor at Lateral Malleoli  28.5 cm    5 cm Proximal to 1st MTP Joint  22.8 cm    Across MTP Joint  22.5 cm    Around Proximal Great Toe  8.4 cm           [facility]patient [facility]  Lymphedema Life Impact Scale Total Score  35.29 %           [facility] Adult PT Treatment/Exercise - 02/19/18 0001      Manual Therapy   Edema Management  Applied velcro compression garment to Lt LE after trimming garment to accomodate for reductions attained. She did not bring extra velcro strap for other garment so just applied lotion and thick stockinette here, they are to don that garment when they get home.     Manual Lymphatic Drainage (MLD)  In Supine with HOB elevated: Short neck, 5 diaphragamtic breaths, then Lt axillary nodes, Lt inguino-axillary anastomosis and Lt upper leg, knee, then retraced steps.     Compression Bandaging  LLE washed with soap and water, then lotion applied to posterior Lt leg; to Lt: three Telfa pads, one abd pad and Artiflex applied at left leg, kept in place by thick stockinette, then velcro compression garment appied.               PT Short Term Goals - 01/21/18 1412      PT SHORT TERM GOAL #1   Title  Pt and husband have a way to manage lymphedema at home with MLD and compression     Status  On-going      PT SHORT TERM GOAL #2   Title  Pt will decrease left leg circumference to 28 cm at 10 cm proximal to lateral foot     Status  Achieved        PT Long Term Goals - 02/03/18 1533      PT LONG TERM GOAL #1   Title  Pt will report she is able to independently manage lymphedema at home    Baseline  Pt has lift chair now so elevates her legs more frequently than was able to before, also has velcro compression garments to  wear at home-02/03/18    Status  Achieved      PT LONG TERM GOAL #2   Title  Pt willi increase her hip flexor strengthto 3+/5 so that she is  able to lift her leg so that she can get in and out of the car by herself     Baseline  Pt reports this much improved, just needs min A intermittently as leg swelling has improved (Rt LE 3-/5, Lt LE 3/5)-02/03/18    Status  On-going      PT LONG TERM GOAL #3   Title  Pt will be able to walk > 100 feet independently without device so that she can manage better at home     Baseline  50 feet with min assist; pt and daugther report pt is walking more than 100 ft now without assist-02/03/18    Status  Achieved            Plan - 02/19/18 1220    Clinical Impression Statement  Pt is still waiting to hear from home health. Therapist called nurse for referring physician about home health beginning and they were going to contact home health agency. Pt still has open, draining and macerated areas on anterior shin of LLE but they have improved greatly from several weeks ago. Pt reports she has been changing the wound dressings every day. Continued with MLD to LLE compression garments while pt is awaiting referral to home health.     Clinical Impairments Affecting Rehab Potential  Pelvic mass causing venous constriction     PT Frequency  2x / week    PT Duration  8 weeks    PT Treatment/Interventions  ADLs/Self Care Home Management;DME Instruction;Therapeutic exercise;Therapeutic activities;Patient/family education;Manual lymph drainage;Compression bandaging;Manual techniques    PT Next Visit Plan  Pt has one more scheduled visit but  plans to call and cancel if home health begins, then will D/C at that time. If pt returns for another visit continue observation of wounds (see pic from todays visit), assessing compliance at home with changing bandages and wearing velcro reduction kit garments to bil LE's, and MLD.     Consulted and Agree with Plan of Care  Patient        Patient will benefit from skilled therapeutic intervention in order to improve the following deficits and impairments:  Decreased knowledge of use of DME, Decreased skin integrity, Postural dysfunction, Increased edema, Decreased knowledge of precautions, Decreased strength  Visit Diagnosis: Lymphedema, not elsewhere classified     Problem List Patient Active Problem List   Diagnosis Date Noted  . Goals of care, counseling/discussion 12/30/2017  . Palliative care encounter   . Neoplasm related pain   . Tachycardia 10/27/2017  . Urinary retention 10/27/2017  . Lower urinary tract infectious disease 10/27/2017  . Chemotherapy-induced neutropenia ([place]) 10/27/2017  . UTI (urinary tract infection) 10/27/2017  . Left femoral vein DVT ([place]) 10/27/2017  . Sepsis ([place]) 10/27/2017  . Acute pyelonephritis 10/27/2017  . Chronic pain 10/23/2017  . Acute pulmonary embolism ([place]) 10/23/2017  . Bilateral pulmonary embolism ([place]) 10/22/2017  . Port-A-Cath in place 07/27/2017  . Port catheter in place 04/14/2016  . Iron deficiency anemia 04/14/2016  . Rectal cancer ([place]) 03/20/2016  . Leukocytosis   . Cellulitis 03/10/2016  . Malnutrition of moderate degree 03/07/2016  . Metastatic colorectal cancer ([place]) 03/03/2016  . Bowel obstruction ([place]) 02/29/2016  . Mass of colon 02/29/2016  . Nausea & vomiting 02/29/2016  . Lumbar radiculopathy 01/26/2014  . Essential thrombocytosis ([place]) 11/10/2011    Allyson Sabal [facility] 02/19/2018, 12:22 PM  [place], [place], [address] Phone: [phone]   Fax:  [phone]  Name: Carolyn Barton MRN: [id number] Date of Birth: 07-Apr-1956  Manus Gunning, PT 02/19/18 12:23 PM  PHYSICAL THERAPY DISCHARGE SUMMARY  Visits from Start of Care: 12  Current functional level related to goals / functional outcomes: See above   Remaining deficits: See above    Education / Equipment: Compression garments, HEP  Plan: Patient agrees to discharge.  Patient goals were partially met. Patient is being discharged due to not returning since the last visit.  ?????    Allyson Sabal [place], [place] 07/08/18 10:57 AM

## 2018-02-22 ENCOUNTER — Other Ambulatory Visit: Payer: Self-pay | Admitting: Oncology

## 2018-02-23 ENCOUNTER — Encounter: Payer: BLUE CROSS/BLUE SHIELD | Admitting: Rehabilitation

## 2018-02-26 ENCOUNTER — Encounter: Payer: BLUE CROSS/BLUE SHIELD | Admitting: Rehabilitation

## 2018-03-01 ENCOUNTER — Inpatient Hospital Stay: Payer: BLUE CROSS/BLUE SHIELD | Attending: Oncology | Admitting: Nurse Practitioner

## 2018-03-01 ENCOUNTER — Inpatient Hospital Stay: Payer: BLUE CROSS/BLUE SHIELD

## 2018-03-01 ENCOUNTER — Encounter: Payer: Self-pay | Admitting: Nurse Practitioner

## 2018-03-01 ENCOUNTER — Other Ambulatory Visit: Payer: Self-pay | Admitting: Nurse Practitioner

## 2018-03-01 ENCOUNTER — Telehealth: Payer: Self-pay | Admitting: Oncology

## 2018-03-01 ENCOUNTER — Encounter: Payer: Self-pay | Admitting: General Practice

## 2018-03-01 VITALS — BP 115/66 | HR 72

## 2018-03-01 VITALS — BP 135/87 | HR 89 | Temp 98.4°F | Resp 18 | Ht 65.0 in | Wt 154.2 lb

## 2018-03-01 DIAGNOSIS — M7989 Other specified soft tissue disorders: Secondary | ICD-10-CM | POA: Insufficient documentation

## 2018-03-01 DIAGNOSIS — Z933 Colostomy status: Secondary | ICD-10-CM | POA: Insufficient documentation

## 2018-03-01 DIAGNOSIS — Z95828 Presence of other vascular implants and grafts: Secondary | ICD-10-CM

## 2018-03-01 DIAGNOSIS — R339 Retention of urine, unspecified: Secondary | ICD-10-CM | POA: Insufficient documentation

## 2018-03-01 DIAGNOSIS — C786 Secondary malignant neoplasm of retroperitoneum and peritoneum: Secondary | ICD-10-CM | POA: Diagnosis not present

## 2018-03-01 DIAGNOSIS — C20 Malignant neoplasm of rectum: Secondary | ICD-10-CM | POA: Diagnosis not present

## 2018-03-01 DIAGNOSIS — R1900 Intra-abdominal and pelvic swelling, mass and lump, unspecified site: Secondary | ICD-10-CM | POA: Diagnosis not present

## 2018-03-01 DIAGNOSIS — I82412 Acute embolism and thrombosis of left femoral vein: Secondary | ICD-10-CM | POA: Diagnosis not present

## 2018-03-01 DIAGNOSIS — Z5112 Encounter for antineoplastic immunotherapy: Secondary | ICD-10-CM | POA: Insufficient documentation

## 2018-03-01 DIAGNOSIS — C19 Malignant neoplasm of rectosigmoid junction: Secondary | ICD-10-CM

## 2018-03-01 DIAGNOSIS — C787 Secondary malignant neoplasm of liver and intrahepatic bile duct: Secondary | ICD-10-CM | POA: Diagnosis not present

## 2018-03-01 DIAGNOSIS — Z7901 Long term (current) use of anticoagulants: Secondary | ICD-10-CM | POA: Insufficient documentation

## 2018-03-01 DIAGNOSIS — D649 Anemia, unspecified: Secondary | ICD-10-CM | POA: Insufficient documentation

## 2018-03-01 DIAGNOSIS — B965 Pseudomonas (aeruginosa) (mallei) (pseudomallei) as the cause of diseases classified elsewhere: Secondary | ICD-10-CM | POA: Insufficient documentation

## 2018-03-01 DIAGNOSIS — R600 Localized edema: Secondary | ICD-10-CM | POA: Insufficient documentation

## 2018-03-01 DIAGNOSIS — L97921 Non-pressure chronic ulcer of unspecified part of left lower leg limited to breakdown of skin: Secondary | ICD-10-CM | POA: Insufficient documentation

## 2018-03-01 DIAGNOSIS — D509 Iron deficiency anemia, unspecified: Secondary | ICD-10-CM

## 2018-03-01 DIAGNOSIS — M549 Dorsalgia, unspecified: Secondary | ICD-10-CM | POA: Insufficient documentation

## 2018-03-01 DIAGNOSIS — Z5111 Encounter for antineoplastic chemotherapy: Secondary | ICD-10-CM | POA: Insufficient documentation

## 2018-03-01 DIAGNOSIS — I2699 Other pulmonary embolism without acute cor pulmonale: Secondary | ICD-10-CM | POA: Diagnosis not present

## 2018-03-01 LAB — CBC WITH DIFFERENTIAL (CANCER CENTER ONLY)
BASOS PCT: 1 %
Basophils Absolute: 0.1 10*3/uL (ref 0.0–0.1)
Eosinophils Absolute: 0.2 10*3/uL (ref 0.0–0.5)
Eosinophils Relative: 3 %
HEMATOCRIT: 25.6 % — AB (ref 34.8–46.6)
Hemoglobin: 7.5 g/dL — ABNORMAL LOW (ref 11.6–15.9)
Lymphocytes Relative: 19 %
Lymphs Abs: 0.9 10*3/uL (ref 0.9–3.3)
MCH: 21.7 pg — ABNORMAL LOW (ref 25.1–34.0)
MCHC: 29.3 g/dL — ABNORMAL LOW (ref 31.5–36.0)
MCV: 74 fL — ABNORMAL LOW (ref 79.5–101.0)
MONO ABS: 1.5 10*3/uL — AB (ref 0.1–0.9)
MONOS PCT: 31 %
NEUTROS ABS: 2.2 10*3/uL (ref 1.5–6.5)
Neutrophils Relative %: 46 %
Platelet Count: 564 10*3/uL — ABNORMAL HIGH (ref 145–400)
RBC: 3.46 MIL/uL — ABNORMAL LOW (ref 3.70–5.45)
RDW: 19.5 % — ABNORMAL HIGH (ref 11.2–14.5)
WBC Count: 4.8 10*3/uL (ref 3.9–10.3)

## 2018-03-01 LAB — CMP (CANCER CENTER ONLY)
ALBUMIN: 2.1 g/dL — AB (ref 3.5–5.0)
ALT: 6 U/L (ref 0–55)
ANION GAP: 6 (ref 3–11)
AST: 13 U/L (ref 5–34)
Alkaline Phosphatase: 69 U/L (ref 40–150)
BILIRUBIN TOTAL: 0.2 mg/dL (ref 0.2–1.2)
BUN: 14 mg/dL (ref 7–26)
CO2: 24 mmol/L (ref 22–29)
Calcium: 8.1 mg/dL — ABNORMAL LOW (ref 8.4–10.4)
Chloride: 103 mmol/L (ref 98–109)
Creatinine: 0.63 mg/dL (ref 0.60–1.10)
GFR, Est AFR Am: >60 mL/min (ref >60)
GLUCOSE: 92 mg/dL (ref 70–140)
Potassium: 3.9 mmol/L (ref 3.5–5.1)
Sodium: 133 mmol/L — ABNORMAL LOW (ref 136–145)
TOTAL PROTEIN: 5.4 g/dL — AB (ref 6.4–8.3)

## 2018-03-01 MED ORDER — LEUCOVORIN CALCIUM INJECTION 350 MG
400.0000 mg/m2 | Freq: Once | INTRAVENOUS | Status: AC
  Administered 2018-03-01: 704 mg via INTRAVENOUS
  Filled 2018-03-01: qty 35.2

## 2018-03-01 MED ORDER — DEXTROSE 5 % IV SOLN
Freq: Once | INTRAVENOUS | Status: AC
  Administered 2018-03-01: 12:00:00 via INTRAVENOUS

## 2018-03-01 MED ORDER — DEXAMETHASONE SODIUM PHOSPHATE 10 MG/ML IJ SOLN
10.0000 mg | Freq: Once | INTRAMUSCULAR | Status: AC
  Administered 2018-03-01: 10 mg via INTRAVENOUS

## 2018-03-01 MED ORDER — PALONOSETRON HCL INJECTION 0.25 MG/5ML
0.2500 mg | Freq: Once | INTRAVENOUS | Status: AC
  Administered 2018-03-01: 0.25 mg via INTRAVENOUS

## 2018-03-01 MED ORDER — FLUOROURACIL CHEMO INJECTION 2.5 GM/50ML
400.0000 mg/m2 | Freq: Once | INTRAVENOUS | Status: AC
  Administered 2018-03-01: 700 mg via INTRAVENOUS
  Filled 2018-03-01: qty 14

## 2018-03-01 MED ORDER — SODIUM CHLORIDE 0.9 % IV SOLN
2400.0000 mg/m2 | INTRAVENOUS | Status: DC
  Administered 2018-03-01: 4200 mg via INTRAVENOUS
  Filled 2018-03-01: qty 84

## 2018-03-01 MED ORDER — DEXTROSE 5 % IV SOLN
85.0000 mg/m2 | Freq: Once | INTRAVENOUS | Status: AC
  Administered 2018-03-01: 150 mg via INTRAVENOUS
  Filled 2018-03-01: qty 20

## 2018-03-01 MED ORDER — SODIUM CHLORIDE 0.9 % IJ SOLN
10.0000 mL | INTRAMUSCULAR | Status: DC | PRN
  Administered 2018-03-01: 10 mL via INTRAVENOUS
  Filled 2018-03-01: qty 10

## 2018-03-01 MED ORDER — BEVACIZUMAB CHEMO INJECTION 400 MG/16ML
5.0000 mg/kg | Freq: Once | INTRAVENOUS | Status: AC
  Administered 2018-03-01: 350 mg via INTRAVENOUS
  Filled 2018-03-01: qty 14

## 2018-03-01 NOTE — Progress Notes (Addendum)
[place] OFFICE PROGRESS NOTE   Diagnosis: Colon cancer  INTERVAL HISTORY:   Carolyn Barton returns as scheduled.  She completed cycle 3 FOLFOX/Avastin 02/08/2018.  She denies nausea/vomiting.  No mouth sores.  No diarrhea.  Cold sensitivity lasted a few days.  No persistent neuropathy symptoms.  She denies any bleeding.  No shortness of breath or chest pain.  Leg edema has recurred over the past 3 to 4 days.  Objective:  Vital signs in last 24 hours:  Blood pressure 135/87, pulse 89, temperature 98.4 F (36.9 C), temperature source Oral, resp. rate 18, height '5\' 5"'$  (1.651 m), weight 154 lb 3.2 oz (69.9 kg), SpO2 100 %.    HEENT: No thrush or ulcers. Resp: Lungs clear bilaterally. Cardio: Regular rate and rhythm. GI: No hepatomegaly.  Left lower quadrant colostomy. Vascular: Tense edema throughout both legs left greater than right.  Skin: Wrapping in place at the left lower leg. Port-A-Cath without erythema.  Lab Results:  Lab Results  Component Value Date   WBC 4.8 03/01/2018   HGB 7.5 (L) 03/01/2018   HCT 25.6 (L) 03/01/2018   MCV 74.0 (L) 03/01/2018   PLT 564 (H) 03/01/2018   NEUTROABS 2.2 03/01/2018    Imaging:  No results found.  Medications: I have reviewed the patient's current medications.  Assessment/Plan: 1.Metastatic colorectal cancer-status post an exploratory laparotomy 03/03/2016 revealing a proximal rectal mass, peritoneal carcinomatosis, and liver metastases  Biopsy of peritoneal nodules 03/03/2016 confirmed metastatic adenocarcinoma consistent with a colon primary  Foundation 1 testing-MSI-stable, tumor mutation burden-low, no BRAF NRAS or KRAS mutation  Cycle 1 FOLFIRI/PANITUMUMAB 04/14/2016  Cycle 2 FOLFIRI/panitumumab 04/28/2016  Cycle 3 FOLFIRI/panitumumab 05/12/2016  Cycle 4 FOLFIRI/panitumumab 05/27/2016  Cycle 5 FOLFIRI/panitumumab 06/09/2016  Restaging CT abdomen/pelvis 06/20/2016-no evidence of disease progression,  decreased left hepatic lesion  Cycle 6 FOLFIRI/panitumumab 06/23/2016  Cycle 7 FOLFIRI/panitumumab 07/07/2016  Cycle 8 FOLFIRI/panitumumab 07/21/2016  Cycle 9 FOLFIRI/PANITUMUMAB 08/04/2016  Cycle 10 FOLFIRI/panitumumab 08/18/2016  Restaging CT 08/29/2016 with interval decrease in the size of the medial segment left liver lesion. Lesion identified previously in the rectosigmoid colon not evident on the current study.  5-FU/PANITUMUMAB 09/01/2016  Xeloda 7 days on/7 days off and PANITUMUMAB every 3 weeks 09/15/2016 (awaiting insurance approval for Xeloda 09/15/2016)  CT abdomen/pelvis 12/25/2016-unchanged 5 mm right hepatic lesion, resolution of medial segment left liver lesion, no new liver lesion. Residual soft tissue fullness in the sigmoid , persistent distal esophageal wall thickening  Continuationof PANITUMUMAB every 3 weeks and Xeloda 7 days on/7 days off  CT 06/19/2017-new cystic/solid lesion in the left adnexa  Xeloda/panitumumab continued  CT 10/08/2016-progression of cystic pelvic mass  Cycle 1 FOLFIRI/Panitumumab 10/21/2017  Cystic pelvic mass aspirated 10/30/2017  Cycle 2 FOLFIRI/panitumumab 11/09/2017  Cycle 3 FOLFIRI/panitumumab 11/23/2017  Cycle 4 FOLFIRI/Panitumumab 12/07/2017  Cycle 5 FOLFIRI/panitumumab 12/21/2017  CT pelvis 12/23/2017- complex cystic pelvic mass with mass-effect on the pelvic vasculature  CT aspiration of cystic pelvic mass 12/23/2017  Cycle 1 FOLFOX/Avastin 01/04/2018  Cycle 2 FOLFOX/Avastin 01/18/2018  Cycle 3 FOLFOX/Avastin 02/08/2018  Cycle 4 FOLFOX/Avastin 03/01/2018  2.History of aBowel obstruction secondary to #1  3. Chronic back pain-maintained on methadone  4. Essential thrombocytosis  5. Early obstruction of the left ureter noted at the time of surgery 03/03/2016-Dr. Tannenbaum placed a left double-J stent 03/18/2016  New onset right hydronephrosis 10/11/2017  Right ureter stent placement 10/13/2017  6. Abdominal  wall cellulitis 03/10/2016, blood cultures positive for coagulase negative staphylococcus-methicillin-resistant, status post treatment with vancomycin  and Bactrim   8. Port-A-Cath placement 04/10/2016, interventional radiology  9. Iron deficiency anemia. Feraheme 11/05/2015 , 11/12/2015,and 04/14/2016-persistent anemia and red cell microcytosis, oral iron resumed 11/17/2016-stable  10. Pain/tenderness left posterior iliac region-resolved  11. Hypokalemia-started on potassium replacement 06/09/2016  12. History of skin rashand paronychiasecondary to [facility], she continues minocycline, moisturizers, and fluticasone as needed  13.Acute onset right low back pain 10/11/2017-likely secondary to obstruction of the right kidney;left ureter stent exchange and right ureter stent placement 10/13/2017  14.Left leg pain and edema-bilateral lower extremity venous Doppler negative for DVT1/15/2019 and 10/19/2017. Left common femoral DVT noted on CT venogram 10/27/2017;CT aspiration of pelvic cystic mass 10/30/2017 with subsequent marked improvement of leg edema.  15. Admission 10/22/2017 with bilateral pulmonary embolism, heparin drip initiated, converted to Lovenox 10/23/2017, converted to Xarelto during the 10/27/2017 hospital admission  16. Admission 10/27/2017 with increased back/leg pain and a fever-blood and urine cultures positive fora resistant E. coli    [name] appears stable.  She has completed 3 cycles of FOLFOX/Avastin.  Plan to proceed with cycle 4 today as scheduled.  She will be referred for restaging CT scans after completion of 5 cycles of FOLFOX/Avastin.  She has recurrent lower extremity edema.  In the past aspiration of the pelvic cystic mass has improved this considerably.  We are referring her to radiology for aspiration of the pelvic mass.  She has progressive anemia.  We will arrange for a blood transfusion this week.  She will  return for lab, follow-up and cycle 5 FOLFOX/Avastin in 3 weeks.  She will contact the office in the interim with any problems.  Patient seen with Dr. Benay Spice.    Ned Card ANP/GNP-BC   03/01/2018  11:26 AM This was a shared visit with Ned Card.  Carolyn Barton has progressive leg edema.  We will refer her for a repeat aspiration of the cystic pelvic mass as this has relieved her edema in the past.  She will continue FOLFOX/Avastin with the plan to schedule a restaging CT after cycle 5.  25 minutes were spent with the patient today.  The majority of the time was used for counseling and coordination of care.

## 2018-03-01 NOTE — Telephone Encounter (Signed)
Appointments scheduled  Per 6/3 los/  I have tried calling patient [place]- (NO ANS) to schedule transfusions 2 units for this week. Patient husband will stop back by scheduling to pick up schedule

## 2018-03-01 NOTE — Progress Notes (Signed)
[place] Spiritual Care Note  Followed up briefly with Carolyn Barton and husband [name]--and, later, brother [name], a chaplain resident-- in infusion to provide emotional/spiritual support. As she often does, Carolyn Barton presented as upbeat and interested in all the goings-on around her. (She is interested in people's emotional and social wellbeing; this is part of what brings meaning and purpose to her life.) She is hopeful that another round of draining will relieve the pressure on her legs again, contributing to more QOL. Deferred to [facility] to support family time together.  Following for support, but please also page if needs arise or circumstances change. Thank you.   [place], [place], [facility] Pager [phone] Voicemail [phone]

## 2018-03-01 NOTE — Patient Instructions (Addendum)
[facility] Discharge Instructions for Patients Receiving Chemotherapy  Today you received the following chemotherapy agents Oxaliplatin,leucovorin,Adrucil,Avastin  To help prevent nausea and vomiting after your treatment, we encourage you to take your nausea medication as directed   If you develop nausea and vomiting that is not controlled by your nausea medication, call the clinic.   BELOW ARE SYMPTOMS THAT SHOULD BE REPORTED IMMEDIATELY:  *FEVER GREATER THAN 100.5 F  *CHILLS WITH OR WITHOUT FEVER  NAUSEA AND VOMITING THAT IS NOT CONTROLLED WITH YOUR NAUSEA MEDICATION  *UNUSUAL SHORTNESS OF BREATH  *UNUSUAL BRUISING OR BLEEDING  TENDERNESS IN MOUTH AND THROAT WITH OR WITHOUT PRESENCE OF ULCERS  *URINARY PROBLEMS  *BOWEL PROBLEMS  UNUSUAL RASH Items with * indicate a potential emergency and should be followed up as soon as possible.  Feel free to call the clinic should you have any questions or concerns. The clinic phone number is [phone].  Please show the [place] at check-in to the Emergency Department and triage nurse.

## 2018-03-03 ENCOUNTER — Inpatient Hospital Stay: Payer: BLUE CROSS/BLUE SHIELD

## 2018-03-03 VITALS — BP 125/77 | HR 82 | Temp 98.2°F | Resp 20

## 2018-03-03 DIAGNOSIS — C20 Malignant neoplasm of rectum: Secondary | ICD-10-CM

## 2018-03-03 DIAGNOSIS — C19 Malignant neoplasm of rectosigmoid junction: Secondary | ICD-10-CM

## 2018-03-03 LAB — SAMPLE TO BLOOD BANK

## 2018-03-03 LAB — IRON AND TIBC
Iron: 38 ug/dL — ABNORMAL LOW (ref 41–142)
SATURATION RATIOS: 14 % — AB (ref 21–57)
TIBC: 278 ug/dL (ref 236–444)
UIBC: 240 ug/dL

## 2018-03-03 LAB — FERRITIN: FERRITIN: 52 ng/mL (ref 9–269)

## 2018-03-03 MED ORDER — SODIUM CHLORIDE 0.9% FLUSH
10.0000 mL | INTRAVENOUS | Status: DC | PRN
  Administered 2018-03-03: 10 mL
  Filled 2018-03-03: qty 10

## 2018-03-03 MED ORDER — HEPARIN SOD (PORK) LOCK FLUSH 100 UNIT/ML IV SOLN
500.0000 [IU] | Freq: Once | INTRAVENOUS | Status: AC | PRN
  Administered 2018-03-03: 500 [IU]
  Filled 2018-03-03: qty 5

## 2018-03-03 NOTE — Patient Instructions (Signed)
Implanted Port Home Guide An implanted port is a type of central line that is placed under the skin. Central lines are used to provide IV access when treatment or nutrition needs to be given through a person's veins. Implanted ports are used for long-term IV access. An implanted port may be placed because:  You need IV medicine that would be irritating to the small veins in your hands or arms.  You need long-term IV medicines, such as antibiotics.  You need IV nutrition for a long period.  You need frequent blood draws for lab tests.  You need dialysis.  Implanted ports are usually placed in the chest area, but they can also be placed in the upper arm, the abdomen, or the leg. An implanted port has two main parts:  Reservoir. The reservoir is round and will appear as a small, raised area under your skin. The reservoir is the part where a needle is inserted to give medicines or draw blood.  Catheter. The catheter is a thin, flexible tube that extends from the reservoir. The catheter is placed into a large vein. Medicine that is inserted into the reservoir goes into the catheter and then into the vein.  How will I care for my incision site? Do not get the incision site wet. Bathe or shower as directed by your health care provider. How is my port accessed? Special steps must be taken to access the port:  Before the port is accessed, a numbing cream can be placed on the skin. This helps numb the skin over the port site.  Your health care provider uses a sterile technique to access the port. ? Your health care provider must put on a mask and sterile gloves. ? The skin over your port is cleaned carefully with an antiseptic and allowed to dry. ? The port is gently pinched between sterile gloves, and a needle is inserted into the port.  Only "non-coring" port needles should be used to access the port. Once the port is accessed, a blood return should be checked. This helps ensure that the port  is in the vein and is not clogged.  If your port needs to remain accessed for a constant infusion, a clear (transparent) bandage will be placed over the needle site. The bandage and needle will need to be changed every week, or as directed by your health care provider.  Keep the bandage covering the needle clean and dry. Do not get it wet. Follow your health care provider's instructions on how to take a shower or bath while the port is accessed.  If your port does not need to stay accessed, no bandage is needed over the port.  What is flushing? Flushing helps keep the port from getting clogged. Follow your health care provider's instructions on how and when to flush the port. Ports are usually flushed with saline solution or a medicine called heparin. The need for flushing will depend on how the port is used.  If the port is used for intermittent medicines or blood draws, the port will need to be flushed: ? After medicines have been given. ? After blood has been drawn. ? As part of routine maintenance.  If a constant infusion is running, the port may not need to be flushed.  How long will my port stay implanted? The port can stay in for as long as your health care provider thinks it is needed. When it is time for the port to come out, surgery will be   done to remove it. The procedure is similar to the one performed when the port was put in. When should I seek immediate medical care? When you have an implanted port, you should seek immediate medical care if:  You notice a bad smell coming from the incision site.  You have swelling, redness, or drainage at the incision site.  You have more swelling or pain at the port site or the surrounding area.  You have a fever that is not controlled with medicine.  This information is not intended to replace advice given to you by your health care provider. Make sure you discuss any questions you have with your health care provider. Document  Released: 09/15/2005 Document Revised: 02/21/2016 Document Reviewed: 05/23/2013 Elsevier Interactive Patient Education  2017 [employer]  

## 2018-03-04 ENCOUNTER — Ambulatory Visit (HOSPITAL_COMMUNITY)
Admission: RE | Admit: 2018-03-04 | Discharge: 2018-03-04 | Disposition: A | Discharge status: Home / Self Care | Payer: BLUE CROSS/BLUE SHIELD | Source: Ambulatory Visit | Attending: Oncology | Admitting: Oncology

## 2018-03-04 ENCOUNTER — Other Ambulatory Visit: Payer: Self-pay

## 2018-03-04 ENCOUNTER — Other Ambulatory Visit: Payer: Self-pay | Admitting: Student

## 2018-03-04 DIAGNOSIS — C19 Malignant neoplasm of rectosigmoid junction: Secondary | ICD-10-CM

## 2018-03-04 DIAGNOSIS — C785 Secondary malignant neoplasm of large intestine and rectum: Secondary | ICD-10-CM | POA: Diagnosis not present

## 2018-03-04 LAB — PREPARE RBC (CROSSMATCH)

## 2018-03-04 MED ORDER — HEPARIN SOD (PORK) LOCK FLUSH 100 UNIT/ML IV SOLN
500.0000 [IU] | Freq: Every day | INTRAVENOUS | Status: AC | PRN
  Administered 2018-03-04: 500 [IU]
  Filled 2018-03-04: qty 5

## 2018-03-04 MED ORDER — SODIUM CHLORIDE 0.9 % IV SOLN: 250.0000 mL | Freq: Once | INTRAVENOUS | Status: DC

## 2018-03-04 MED ORDER — SODIUM CHLORIDE 0.9% FLUSH
10.0000 mL | INTRAVENOUS | Status: AC | PRN
  Administered 2018-03-04: 10 mL

## 2018-03-04 NOTE — Progress Notes (Signed)
            Patient Care Center Note   Diagnosis: Anemia    Provider: Edrick Kins B     Procedure: 2 Units of PRBC    Note: Carolyn Barton came into the [name] today received 2 units of PRBC. Port was accessed and deaccessed with heparin. Tolerated well. Vitals stable. Discharge instructions reviewed, verbalized understanding. Alert, oriented and ambulatory at time of discharge. Discharged with family member.   Otho Bellows, RN

## 2018-03-04 NOTE — Discharge Instructions (Signed)

## 2018-03-05 ENCOUNTER — Encounter (HOSPITAL_COMMUNITY): Payer: Self-pay

## 2018-03-05 ENCOUNTER — Ambulatory Visit (HOSPITAL_COMMUNITY)
Admission: RE | Admit: 2018-03-05 | Discharge: 2018-03-05 | Disposition: A | Discharge status: Home / Self Care | Payer: BLUE CROSS/BLUE SHIELD | Source: Ambulatory Visit | Attending: Nurse Practitioner | Admitting: Nurse Practitioner

## 2018-03-05 ENCOUNTER — Other Ambulatory Visit: Payer: Self-pay | Admitting: Nurse Practitioner

## 2018-03-05 DIAGNOSIS — Z79899 Other long term (current) drug therapy: Secondary | ICD-10-CM | POA: Diagnosis not present

## 2018-03-05 DIAGNOSIS — D473 Essential (hemorrhagic) thrombocythemia: Secondary | ICD-10-CM | POA: Diagnosis not present

## 2018-03-05 DIAGNOSIS — R188 Other ascites: Secondary | ICD-10-CM | POA: Diagnosis not present

## 2018-03-05 DIAGNOSIS — F419 Anxiety disorder, unspecified: Secondary | ICD-10-CM | POA: Insufficient documentation

## 2018-03-05 DIAGNOSIS — C787 Secondary malignant neoplasm of liver and intrahepatic bile duct: Secondary | ICD-10-CM | POA: Insufficient documentation

## 2018-03-05 DIAGNOSIS — R1900 Intra-abdominal and pelvic swelling, mass and lump, unspecified site: Secondary | ICD-10-CM | POA: Insufficient documentation

## 2018-03-05 DIAGNOSIS — Z86711 Personal history of pulmonary embolism: Secondary | ICD-10-CM | POA: Diagnosis not present

## 2018-03-05 DIAGNOSIS — E876 Hypokalemia: Secondary | ICD-10-CM | POA: Diagnosis not present

## 2018-03-05 DIAGNOSIS — K449 Diaphragmatic hernia without obstruction or gangrene: Secondary | ICD-10-CM | POA: Diagnosis not present

## 2018-03-05 DIAGNOSIS — C785 Secondary malignant neoplasm of large intestine and rectum: Secondary | ICD-10-CM | POA: Insufficient documentation

## 2018-03-05 DIAGNOSIS — C19 Malignant neoplasm of rectosigmoid junction: Secondary | ICD-10-CM

## 2018-03-05 DIAGNOSIS — M419 Scoliosis, unspecified: Secondary | ICD-10-CM | POA: Diagnosis not present

## 2018-03-05 DIAGNOSIS — Z933 Colostomy status: Secondary | ICD-10-CM | POA: Insufficient documentation

## 2018-03-05 DIAGNOSIS — M549 Dorsalgia, unspecified: Secondary | ICD-10-CM | POA: Insufficient documentation

## 2018-03-05 DIAGNOSIS — N9489 Other specified conditions associated with female genital organs and menstrual cycle: Secondary | ICD-10-CM | POA: Diagnosis not present

## 2018-03-05 DIAGNOSIS — Z7901 Long term (current) use of anticoagulants: Secondary | ICD-10-CM | POA: Insufficient documentation

## 2018-03-05 DIAGNOSIS — K56609 Unspecified intestinal obstruction, unspecified as to partial versus complete obstruction: Secondary | ICD-10-CM | POA: Diagnosis not present

## 2018-03-05 DIAGNOSIS — K219 Gastro-esophageal reflux disease without esophagitis: Secondary | ICD-10-CM | POA: Diagnosis not present

## 2018-03-05 DIAGNOSIS — F329 Major depressive disorder, single episode, unspecified: Secondary | ICD-10-CM | POA: Insufficient documentation

## 2018-03-05 DIAGNOSIS — R600 Localized edema: Secondary | ICD-10-CM | POA: Diagnosis not present

## 2018-03-05 LAB — BPAM RBC
BLOOD PRODUCT EXPIRATION DATE: 201906232359
BLOOD PRODUCT EXPIRATION DATE: 201906232359
ISSUE DATE / TIME: 201906060919
ISSUE DATE / TIME: 201906060919
UNIT TYPE AND RH: 600
Unit Type and Rh: 600

## 2018-03-05 LAB — TYPE AND SCREEN
ABO/RH(D): A NEG
Antibody Screen: NEGATIVE
UNIT DIVISION: 0
UNIT DIVISION: 0

## 2018-03-05 LAB — PROTIME-INR
INR: 1
Prothrombin Time: 13.1 seconds (ref 11.4–15.2)

## 2018-03-05 LAB — CBC
HEMATOCRIT: 34.3 % — AB (ref 36.0–46.0)
Hemoglobin: 10.6 g/dL — ABNORMAL LOW (ref 12.0–15.0)
MCH: 23 pg — ABNORMAL LOW (ref 26.0–34.0)
MCHC: 30.9 g/dL (ref 30.0–36.0)
MCV: 74.4 fL — AB (ref 78.0–100.0)
Platelets: 534 10*3/uL — ABNORMAL HIGH (ref 150–400)
RBC: 4.61 MIL/uL (ref 3.87–5.11)
RDW: 20.2 % — AB (ref 11.5–15.5)
WBC: 6.8 10*3/uL (ref 4.0–10.5)

## 2018-03-05 LAB — APTT: aPTT: 28 seconds (ref 24–36)

## 2018-03-05 MED ORDER — SODIUM CHLORIDE 0.9 % IV SOLN: INTRAVENOUS | Status: DC

## 2018-03-05 MED ORDER — FENTANYL CITRATE (PF) 100 MCG/2ML IJ SOLN
INTRAMUSCULAR | Status: AC | PRN
  Administered 2018-03-05 (×3): 50 ug via INTRAVENOUS

## 2018-03-05 MED ORDER — MIDAZOLAM HCL 2 MG/2ML IJ SOLN
INTRAMUSCULAR | Status: AC
  Filled 2018-03-05: qty 4

## 2018-03-05 MED ORDER — SODIUM CHLORIDE 0.9 % IV SOLN
INTRAVENOUS | Status: DC
Start: 2018-03-05 — End: 2018-03-06
  Administered 2018-03-05: 08:00:00 via INTRAVENOUS

## 2018-03-05 MED ORDER — FENTANYL CITRATE (PF) 100 MCG/2ML IJ SOLN
INTRAMUSCULAR | Status: AC
  Filled 2018-03-05: qty 4

## 2018-03-05 MED ORDER — MIDAZOLAM HCL 2 MG/2ML IJ SOLN
INTRAMUSCULAR | Status: AC | PRN
  Administered 2018-03-05: 1 mg via INTRAVENOUS
  Administered 2018-03-05: 0.5 mg via INTRAVENOUS
  Administered 2018-03-05: 1 mg via INTRAVENOUS

## 2018-03-05 MED ORDER — LIDOCAINE-EPINEPHRINE (PF) 1 %-1:200000 IJ SOLN
INTRAMUSCULAR | Status: AC | PRN
  Administered 2018-03-05 (×2): 10 mL via INTRADERMAL

## 2018-03-05 NOTE — H&P (Signed)
Referring Physician(s): Sherrill,B  Supervising Physician: Sandi Mariscal  Patient Status:  [facility]  Chief Complaint:  "I'm having my cyst aspirated again"  Subjective: Patient familiar to IR service from prior Port-A-Cath placement in 2017 as well as cystic pelvic mass aspiration on 10/30/2017, 12/23/2017 and 01/27/2018.  Previous cytology has revealed adenocarcinoma consistent with colon primary.  She presents today with recurrent symptomatic pelvic cyst and associated bilateral lower extremity edema.  Request received for re-aspiration of the pelvic cyst.  She currently denies fever, headache, chest pain, dyspnea, cough, abdominal/back pain, nausea, vomiting or bleeding.  She does have fatigue. Past Medical History:  Diagnosis Date  . Abdominal wall cellulitis 03/10/2016  . Anxiety   . Ascites 10/27/2017   Small volume noted on CT  . Aspiration of liquid 12/23/2017   pelvic cyst/mass  . Bowel obstruction ([place])   . Chronic back pain    lumbar    . Colostomy in place [facility])   . Depression   . DVT (deep venous thrombosis) ([place]) 10/27/2017   Non occlusive left common femoral vein  . Dyspnea   . 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 GERD (gastroesophageal reflux disease)   . Grade I diastolic dysfunction 09/32/3557   ECHO   . History of chemotherapy last chemo 09-21-17  . History of hiatal hernia   . History of pelvic mass 11/2017   cyst  . History of pulmonary embolism 09/2017   Bilateral  . Hydronephrosis    right  . Hypokalemia    takes potassium  . Iron deficiency anemia 04/14/2016   treated w/ Iron infusions  . Liver metastasis ([place])    secondary to colorectal cancer  . Lower leg edema   . Metastatic colorectal cancer [facility]) dx 03/03/2016--- oncologist-- dr Benay Spice   metastatic colorectal carcinoma--  s/p  exp. lap. for bowel obstruction--  rectal mass, peritoneal carcinomatosis, liver mets---  chemotherapy  . Scoliosis   . Skin rash    on legs  seeing wound care specialist  . Spinal headache 12/21/2012  . Tachycardia    persistant since discharged from hospital 06/ 2017 due to anemia and deconditioning;  as of 09-02-2016 per pt no issues w/ heart racing in the past few weeks  . Ureteral obstruction, left   . Wears contact lenses    Past Surgical History:  Procedure Laterality Date  . BIOPSY N/A 03/03/2016   Procedure: BIOPSY OF PERITONEAL NODULE;  Surgeon: Erroll Luna, MD;  Location: [place];  Service: General;  Laterality: N/A;  . COLOSTOMY N/A 03/03/2016   Procedure: DIVERTING SIGMOID COLOSTOMY;  Surgeon: Erroll Luna, MD;  Location: [place];  Service: General;  Laterality: N/A;  . CYSTOSCOPY W/ RETROGRADES Right 10/13/2017   Procedure: CYSTOSCOPY WITH RETROGRADE PYELOGRAM/ STENT PLACEMENT;  Surgeon: Ceasar Mons, MD;  Location: [facility];  Service: Urology;  Laterality: Right;  ONLY NEEDS 30 MIN FOR BOTH PROCEDURES  . CYSTOSCOPY W/ URETERAL STENT PLACEMENT Left 10/09/2016   Procedure: CYSTOSCOPY WITH LEFT STENT REPLACEMENT 32F POLARIS STENT 24CM;  Surgeon: Carolan Clines, MD;  Location: [facility];  Service: Urology;  Laterality: Left;  . CYSTOSCOPY W/ URETERAL STENT PLACEMENT Left 03/16/2017   Procedure: CYSTOSCOPY WITH RETROGRADE PYELOGRAM WITH LEFT  STENT REMOVAL AND REPLACEMENT;  Surgeon: Carolan Clines, MD;  Location: [facility];  Service: Urology;  Laterality: Left;  . CYSTOSCOPY W/ URETERAL STENT PLACEMENT Left 10/13/2017   Procedure: CYSTOSCOPY WITH STENT  REPLACEMENT;  Surgeon: Ceasar Mons, MD;  Location[facility];  Service: Urology;  Laterality: Left;  . CYSTOSCOPY WITH FULGERATION N/A 10/13/2017   Procedure: CYSTOSCOPY WITH FULGERATION;  Surgeon: Ceasar Mons, MD;  Location: [facility];  Service: Urology;  Laterality: N/A;  . CYSTOSCOPY WITH RETROGRADE PYELOGRAM, URETEROSCOPY AND STENT PLACEMENT Left  03/18/2016   Procedure: CYSTOSCOPY WITH LEFT  RETROGRADE PYELOGRAM,  AND  POLARIS STENT PLACEMENT;  Surgeon: Carolan Clines, MD;  Location: [facility];  Service: Urology;  Laterality: Left;  . LAPAROTOMY N/A 03/03/2016   Procedure: EXPLORATORY LAPAROTOMY;  Surgeon: Erroll Luna, MD;  Location: [place];  Service: General;  Laterality: N/A;  . LUMBAR LAMINECTOMY  11/11/2012   L4-5  and Resection synovial cyst  . MAXIMUM ACCESS (MAS)POSTERIOR LUMBAR INTERBODY FUSION (PLIF) 1 LEVEL N/A 01/11/2014   Procedure: FOR MAXIMUM ACCESS (MAS) POSTERIOR LUMBAR INTERBODY FUSION Lumbar Five Sacral One;  Surgeon: [name], MD;  Location: [place] [facility];  Service: Neurosurgery;  Laterality: N/A;  FOR MAXIMUM ACCESS (MAS) POSTERIOR LUMBAR INTERBODY FUSION Lumbar Five Sacral One  . PORTACATH PLACEMENT  04/10/2016  . REPAIR CEREBROSPINAL FLUID LEAK POST RESECTION SYNOVIAL CYST  12/21/2012  . REVISION LUMBAR HARDWARE  01/26/2014  . TRANSTHORACIC ECHOCARDIOGRAM  06/17/2016   grade 1 diastolic function , ef 49-70%/  trivial TR  . WISDOM TOOTH EXTRACTION        Allergies: Patient has no known allergies.  Medications: Prior to Admission medications   Medication Sig Start Date End Date Taking? Authorizing Provider  DULoxetine (CYMBALTA) 60 MG capsule Take 1 capsule (60 mg total) by mouth every evening. 06/15/17  Yes Ladell Pier, MD  ferrous sulfate 325 (65 FE) MG EC tablet Take 1 tablet (325 mg total) by mouth daily. 11/17/16  Yes Ladell Pier, MD  lidocaine-prilocaine (EMLA) cream Apply to port site one hour prior to use. Do not rub in. Cover with plastic. Patient taking differently: Apply 1 application topically daily as needed (prior to port site being accessed.). Apply to port site one hour prior to use. Do not rub in. Cover with plastic. 03/02/17  Yes Owens Shark, NP  methadone (DOLOPHINE) 10 MG tablet Take 10 mg by mouth 3 (three) times daily.    Yes [provider]  morphine (MSIR) 15 MG  tablet Take 1-2 tablets (15-30 mg total) by mouth every 4 (four) hours as needed for severe pain. 11/01/17  Yes Georgette Shell, MD  omeprazole (PRILOSEC) 20 MG capsule Take 1 capsule (20 mg total) by mouth daily. 10/21/17  Yes Ladell Pier, MD  ondansetron (ZOFRAN) 8 MG tablet Take 1 tablet (8 mg total) by mouth every 8 (eight) hours as needed for nausea or vomiting. 12/23/17  Yes Ladell Pier, MD  potassium chloride (K-DUR) 10 MEQ tablet Take 1 tablet (10 mEq total) by mouth 2 (two) times daily. 09/21/17  Yes Ladell Pier, MD  furosemide (LASIX) 20 MG tablet Take 1 tablet (20 mg total) by mouth daily. 11/24/17   Ladell Pier, MD  polyethylene glycol (MIRALAX / Floria Raveling) packet Take 17 g by mouth daily as needed for mild constipation.     [provider]  prochlorperazine (COMPAZINE) 10 MG tablet Take 1 tablet (10 mg total) by mouth every 6 (six) hours as needed for nausea or vomiting. Patient not taking: Reported on 03/01/2018 12/23/17   Ladell Pier, MD  XARELTO 20 MG TABS tablet TAKE 1 TABLET DAILY  WITH SUPPER. 02/08/18   Ladell Pier, MD     Vital Signs: BP (!) 141/95 (BP Location: Right Arm)   Pulse 90   Temp 98.8 F (37.1 C) (Oral)   Resp 18   SpO2 100%   Physical Exam awake, alert.  Chest clear to auscultation bilaterally.  Intact right chest wall port a cath. Heart with regular rate and rhythm.  Abdomen firm, positive bowels, intact left colostomy, nontender.  Significant bilateral lower extremity edema.   Imaging: No results found.  Labs:  CBC: Recent Labs    01/26/18 1027 02/08/18 0906 03/01/18 1004 03/05/18 0729  WBC 7.0 5.2 4.8 6.8  HGB 8.4* 8.3* 7.5* 10.6*  HCT 27.2* 27.7* 25.6* 34.3*  PLT 460* 474* 564* 534*    COAGS: Recent Labs    10/30/17 0259 12/23/17 0719 01/26/18 1027 03/05/18 0729  INR 0.97 1.26 1.03 1.00  APTT  --  31 29 28     BMP: Recent Labs    01/04/18 0847 01/18/18 0852 02/08/18 0906 03/01/18 1004  NA  134* 132* 138 133*  K 3.9 3.9 4.0 3.9  CL 103 104 107 103  CO2 26 23 26 24   GLUCOSE 100 118 78 92  BUN 11 12 9 14   CALCIUM 8.2* 8.2* 7.8* 8.1*  CREATININE 0.58* 0.59* 0.50* 0.63  GFRNONAA >60 >60 >60 >60  GFRAA >60 >60 >60 >60    LIVER FUNCTION TESTS: Recent Labs    01/04/18 0847 01/18/18 0852 02/08/18 0906 03/01/18 1004  BILITOT <0.2* <0.2* <0.2* 0.2  AST 13 11 12 13   ALT 10 8 10 6   ALKPHOS 70 67 63 69  PROT 4.7* 4.9* 4.7* 5.4*  ALBUMIN 1.8* 1.5* 1.9* 2.1*    Assessment and Plan: Patient with history of metastatic colon carcinoma recurrent symptomatic cystic pelvic mass associated bilateral lower extremity edema.  Is undergone previous aspiration of this cyst in February, March and May of this year.  She presents again today for repeat image guided aspiration of this pelvic cystic mass.Risks and benefits discussed with the patient/spouse including, but not limited to bleeding, infection, damage to adjacent structures or low yield requiring additional tests.  All of the patient's questions were answered, patient is agreeable to proceed. Consent signed and in chart.     Electronically Signed: D. Rowe Robert, PA-C 03/05/2018, 8:14 AM   I spent a total of 25 minutes at the the patient's bedside AND on the patient's hospital floor or unit, greater than 50% of which was counseling/coordinating care for image guided aspiration of pelvic cystic mass

## 2018-03-05 NOTE — Discharge Instructions (Signed)

## 2018-03-05 NOTE — Procedures (Signed)
Pre procedural Dx: Recurrent symptomatic cystic pelvic mass Post procedural Dx: Same  Technically successful CT guided aspiration of a total of 750 cc of complex cystic pelvic mass.  EBL: None  Complications: None immediate  Ronny Bacon, MD Pager #: [phone]

## 2018-03-17 ENCOUNTER — Telehealth: Payer: Self-pay

## 2018-03-17 ENCOUNTER — Inpatient Hospital Stay: Payer: BLUE CROSS/BLUE SHIELD

## 2018-03-17 ENCOUNTER — Inpatient Hospital Stay (HOSPITAL_BASED_OUTPATIENT_CLINIC_OR_DEPARTMENT_OTHER): Payer: BLUE CROSS/BLUE SHIELD | Admitting: Medical

## 2018-03-17 VITALS — BP 132/79 | HR 94 | Temp 99.0°F | Resp 18 | Ht 65.0 in | Wt 149.8 lb

## 2018-03-17 DIAGNOSIS — C20 Malignant neoplasm of rectum: Secondary | ICD-10-CM | POA: Diagnosis not present

## 2018-03-17 DIAGNOSIS — C19 Malignant neoplasm of rectosigmoid junction: Secondary | ICD-10-CM

## 2018-03-17 DIAGNOSIS — R600 Localized edema: Secondary | ICD-10-CM | POA: Diagnosis not present

## 2018-03-17 DIAGNOSIS — L97921 Non-pressure chronic ulcer of unspecified part of left lower leg limited to breakdown of skin: Secondary | ICD-10-CM

## 2018-03-17 DIAGNOSIS — C787 Secondary malignant neoplasm of liver and intrahepatic bile duct: Secondary | ICD-10-CM

## 2018-03-17 DIAGNOSIS — C786 Secondary malignant neoplasm of retroperitoneum and peritoneum: Secondary | ICD-10-CM | POA: Diagnosis not present

## 2018-03-17 DIAGNOSIS — Z87891 Personal history of nicotine dependence: Secondary | ICD-10-CM

## 2018-03-17 LAB — CBC WITH DIFFERENTIAL (CANCER CENTER ONLY)
BASOS PCT: 1 %
Basophils Absolute: 0.1 10*3/uL (ref 0.0–0.1)
EOS ABS: 0.2 10*3/uL (ref 0.0–0.5)
Eosinophils Relative: 6 %
HCT: 29.9 % — ABNORMAL LOW (ref 34.8–46.6)
HEMOGLOBIN: 9 g/dL — AB (ref 11.6–15.9)
Lymphocytes Relative: 26 %
Lymphs Abs: 1.1 10*3/uL (ref 0.9–3.3)
MCH: 22.9 pg — ABNORMAL LOW (ref 25.1–34.0)
MCHC: 30.1 g/dL — AB (ref 31.5–36.0)
MCV: 76.1 fL — ABNORMAL LOW (ref 79.5–101.0)
MONOS PCT: 22 %
Monocytes Absolute: 0.9 10*3/uL (ref 0.1–0.9)
NEUTROS PCT: 45 %
Neutro Abs: 1.8 10*3/uL (ref 1.5–6.5)
Platelet Count: 425 10*3/uL — ABNORMAL HIGH (ref 145–400)
RBC: 3.93 MIL/uL (ref 3.70–5.45)
RDW: 22.1 % — AB (ref 11.2–14.5)
WBC Count: 4 10*3/uL (ref 3.9–10.3)

## 2018-03-17 MED ORDER — SULFAMETHOXAZOLE-TRIMETHOPRIM 800-160 MG PO TABS: 1.0000 | ORAL_TABLET | Freq: Two times a day (BID) | ORAL | 0 refills | Status: DC

## 2018-03-17 NOTE — Telephone Encounter (Signed)
Call from Nehemiah Settle, RN with [facility] reporting that pt's LLE now has "spots of sloughing and green drainage". She also reports that the pt has a temp of "99.3" and requests that she be seen in [facility] to "rule out infection". Ok per Sandi Mealy, PA. Spoke with pt and alerted to come in.   [facility] nurse also requesting verbal order ABI LLE , ok per Dr. Benay Spice. Antony Madura, RN to coordinate with wound and ostomy nurse.

## 2018-03-18 ENCOUNTER — Telehealth: Payer: Self-pay

## 2018-03-18 ENCOUNTER — Telehealth: Payer: Self-pay | Admitting: Medical

## 2018-03-18 NOTE — Progress Notes (Signed)
[facility]pt[facility] Progress Note   PIXIE BURGENER [id number] [birth year] 62 y.o.  Carolyn Barton is managed by Dr. Dominica Severin B. 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  The patient was placed on Bactrim DS p.o. twice daily x7 days.  Culture results are pending.     Metastatic colorectal cancer: The patient is status post cycle 4, day 1 of FOLFOX and Avastin dosed on 03/01/2018.  The patient is to return on 03/22/2018 for consideration of her next cycle of chemotherapy and to see Dr. Benay Spice.  Please see After Visit Summary for patient specific instructions.  Future Appointments  Date Time Provider [place]  03/22/2018  8:30 AM [facility] LAB 2 [facility] None  03/22/2018  8:45 AM [facility] J32 DNS [facility] None  03/22/2018  9:00 AM Ladell Pier, MD [facility] None  03/22/2018 10:15 AM [facility] F21 [facility] None  03/24/2018  1:30 PM [facility] FLUSH NURSE [facility] None  04/12/2018  8:15 AM [facility] LAB 4 [facility] None  04/12/2018  8:30 AM [facility] [place] FLUSH [facility] None  04/12/2018  9:00 AM Ladell Pier, MD [facility] None  04/12/2018 10:00 AM [facility] INFUSION [facility] None  04/14/2018 12:30 PM [facility] [place] FLUSH [facility] None    Orders Placed This Encounter  Procedures  . Culture, Aerobic  . Aerobic Culture (superficial specimen)       Subjective:   Patient ID:  THERESSA PIEDRA is a 62 y.o. (DOB  [birth year]) female.  Chief Complaint: No chief complaint on file.   HPI HARBOUR NORDMEYER is a 62 year old female who is managed by Dr. Dominica Severin B. Sherrill for a metastatic colorectal cancer.  She is status post cycle 4 of FOLFOX and Avastin which was last dose on 03/01/2018.  She has ongoing bilateral lower extremity edema secondary to bulky disease.  She is followed by home health/wound clinic for her bilateral lower extremity edema and continues to have dressings changed we believe by home health/wound clinic.  She otherwise changes the dressings by herself.  She presents to the clinic today after home health reported that they noted a greenish discharge from several superficial proximal anterior left lower extremity ulcerations.  She denies fevers, chills, or sweats.  She was told by home health that her temperature today was slightly over 99.  She was able to drive herself to her appointment today.  Medications: I have reviewed the patient's current medications.  Allergies: No Known Allergies  Past Medical History:  Diagnosis Date  . Abdominal wall cellulitis 03/10/2016  . Anxiety   . Ascites 10/27/2017   Small volume noted on CT  . Aspiration of liquid 12/23/2017   pelvic cyst/mass  . Bowel obstruction ([place])   . Chronic back pain    lumbar    . Colostomy in place [facility])   . Depression   . DVT (deep venous thrombosis) ([place]) 10/27/2017   Non occlusive left common femoral vein  . Dyspnea   . 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 GERD (gastroesophageal reflux disease)   .  Grade I diastolic dysfunction 31/49/7026   ECHO   . History of chemotherapy last chemo 09-21-17  . History of hiatal hernia   . History of pelvic mass 11/2017   cyst  . History of pulmonary embolism 09/2017   Bilateral  . Hydronephrosis    right  . Hypokalemia    takes potassium  . Iron deficiency anemia 04/14/2016   treated w/ Iron infusions  . Liver metastasis ([place])    secondary to  colorectal cancer  . Lower leg edema   . Metastatic colorectal cancer [facility]) dx 03/03/2016--- oncologist-- dr Benay Spice   metastatic colorectal carcinoma--  s/p  exp. lap. for bowel obstruction--  rectal mass, peritoneal carcinomatosis, liver mets---  chemotherapy  . Scoliosis   . Skin rash    on legs seeing wound care specialist  . Spinal headache 12/21/2012  . Tachycardia    persistant since discharged from hospital 06/ 2017 due to anemia and deconditioning;  as of 09-02-2016 per pt no issues w/ heart racing in the past few weeks  . Ureteral obstruction, left   . Wears contact lenses     Past Surgical History:  Procedure Laterality Date  . BIOPSY N/A 03/03/2016   Procedure: BIOPSY OF PERITONEAL NODULE;  Surgeon: Erroll Luna, MD;  Location: [place];  Service: General;  Laterality: N/A;  . COLOSTOMY N/A 03/03/2016   Procedure: DIVERTING SIGMOID COLOSTOMY;  Surgeon: Erroll Luna, MD;  Location: [place];  Service: General;  Laterality: N/A;  . CYSTOSCOPY W/ RETROGRADES Right 10/13/2017   Procedure: CYSTOSCOPY WITH RETROGRADE PYELOGRAM/ STENT PLACEMENT;  Surgeon: Ceasar Mons, MD;  Location: [facility]  Service: Urology;  Laterality: Right;  ONLY NEEDS 30 MIN FOR BOTH PROCEDURES  . CYSTOSCOPY W/ URETERAL STENT PLACEMENT Left 10/09/2016   Procedure: CYSTOSCOPY WITH LEFT STENT REPLACEMENT 64F POLARIS STENT 24CM;  Surgeon: Carolan Clines, MD;  Location: [facility];  Service: Urology;  Laterality: Left;  . CYSTOSCOPY W/ URETERAL STENT PLACEMENT Left 03/16/2017   Procedure: CYSTOSCOPY WITH RETROGRADE PYELOGRAM WITH LEFT  STENT REMOVAL AND REPLACEMENT;  Surgeon: Carolan Clines, MD;  Location: [facility];  Service: Urology;  Laterality: Left;  . CYSTOSCOPY W/ URETERAL STENT PLACEMENT Left 10/13/2017   Procedure: CYSTOSCOPY WITH STENT REPLACEMENT;  Surgeon: Ceasar Mons, MD;  Location: [facility];  Service:  Urology;  Laterality: Left;  . CYSTOSCOPY WITH FULGERATION N/A 10/13/2017   Procedure: CYSTOSCOPY WITH FULGERATION;  Surgeon: Ceasar Mons, MD;  Location: [facility];  Service: Urology;  Laterality: N/A;  . CYSTOSCOPY WITH RETROGRADE PYELOGRAM, URETEROSCOPY AND STENT PLACEMENT Left 03/18/2016   Procedure: CYSTOSCOPY WITH LEFT  RETROGRADE PYELOGRAM,  AND  POLARIS STENT PLACEMENT;  Surgeon: Carolan Clines, MD;  Location: [facility];  Service: Urology;  Laterality: Left;  . LAPAROTOMY N/A 03/03/2016   Procedure: EXPLORATORY LAPAROTOMY;  Surgeon: Erroll Luna, MD;  Location: [place];  Service: General;  Laterality: N/A;  . LUMBAR LAMINECTOMY  11/11/2012   L4-5  and Resection synovial cyst  . MAXIMUM ACCESS (MAS)POSTERIOR LUMBAR INTERBODY FUSION (PLIF) 1 LEVEL N/A 01/11/2014   Procedure: FOR MAXIMUM ACCESS (MAS) POSTERIOR LUMBAR INTERBODY FUSION Lumbar Five Sacral One;  Surgeon: [name], MD;  Location: [place] [facility];  Service: Neurosurgery;  Laterality: N/A;  FOR MAXIMUM ACCESS (MAS) POSTERIOR LUMBAR INTERBODY FUSION Lumbar Five Sacral One  . PORTACATH PLACEMENT  04/10/2016  . REPAIR CEREBROSPINAL FLUID LEAK POST RESECTION SYNOVIAL CYST  12/21/2012  . REVISION LUMBAR  HARDWARE  01/26/2014  . TRANSTHORACIC ECHOCARDIOGRAM  06/17/2016   grade 1 diastolic function , ef 57-84%/  trivial TR  . WISDOM TOOTH EXTRACTION      Family History  Problem Relation Age of Onset  . Heart disease Mother   . Melanoma Mother   . Colon cancer Neg Hx   . Colon polyps Neg Hx   . Rectal cancer Neg Hx   . Stomach cancer Neg Hx   . Esophageal cancer Neg Hx     Social History   Socioeconomic History  . Marital status: Married    Spouse name: Purcell Nails  . Number of children: 2  . Years of education: Not on file  . Highest education level: Not on file  Occupational History  . Not on file  Social Needs  . Financial resource strain: Not on file  . Food insecurity:    Worry: Not on  file    Inability: Not on file  . Transportation needs:    Medical: Not on file    Non-medical: Not on file  Tobacco Use  . Smoking status: Former Smoker    Packs/day: 0.25    Years: 1.00    Pack years: 0.25    Types: Cigarettes    Last attempt to quit: 10/03/1978    Years since quitting: 39.4  . Smokeless tobacco: Never Used  Substance and Sexual Activity  . Alcohol use: No    Alcohol/week: 0.0 oz  . Drug use: No  . Sexual activity: Yes    Birth control/protection: Post-menopausal  Lifestyle  . Physical activity:    Days per week: Not on file    Minutes per session: Not on file  . Stress: Not on file  Relationships  . Social connections:    Talks on phone: Not on file    Gets together: Not on file    Attends religious service: Not on file    Active member of club or organization: Not on file    Attends meetings of clubs or organizations: Not on file    Relationship status: Not on file  . Intimate partner violence:    Fear of current or ex partner: Not on file    Emotionally abused: Not on file    Physically abused: Not on file    Forced sexual activity: Not on file  Other Topics Concern  . Not on file  Social History Narrative   03/20/16: Married, husband Purcell Nails for 33+ years   #2 children-grown    Past Medical History, Surgical history, Social history, and Family history were reviewed and updated as appropriate.   Please see review of systems for further details on the patient's review from today.   Review of Systems:  Review of Systems  Constitutional: Negative for chills, diaphoresis and fever.  Cardiovascular: Positive for leg swelling.  Skin: Positive for wound.       Anterior proximal left lower extremity superficial wounds with bilateral lower extremity lymphedema greater on the left than right.    Objective:   Physical Exam:  BP 132/79 (BP Location: Left Arm, Patient Position: Sitting)   Pulse 94   Temp 99 F (37.2 C) (Oral)   Resp 18   Ht 5'  5" (1.651 m)   Wt 149 lb 12.8 oz (67.9 kg)   SpO2 100%   BMI 24.93 kg/m  ECOG: 1  Physical Exam  Constitutional: No distress.  HENT:  Head: Normocephalic and atraumatic.  Cardiovascular: Normal rate, regular rhythm and normal  heart sounds. Exam reveals no friction rub.  No murmur heard. Pulmonary/Chest: Effort normal and breath sounds normal. No stridor. No respiratory distress. She has no wheezes. She has no rales.  Neurological: She is alert. Coordination normal.  Skin: Skin is warm and dry. She is not diaphoretic.  Bilateral lower extremity edema with multiple proximal anterior lower extremity superficial ulcerations.  No increased warmth, erythema, or exudate noted.  Psychiatric: She has a normal mood and affect. Her behavior is normal. Judgment and thought content normal.    Lab Review:     Component Value Date/Time   NA 133 (L) 03/01/2018 1004   NA 139 09/21/2017 0746   K 3.9 03/01/2018 1004   K 3.3 (L) 09/21/2017 0746   CL 103 03/01/2018 1004   CL 101 12/01/2012 0924   CO2 24 03/01/2018 1004   CO2 28 09/21/2017 0746   GLUCOSE 92 03/01/2018 1004   GLUCOSE 92 09/21/2017 0746   GLUCOSE 94 12/01/2012 0924   BUN 14 03/01/2018 1004   BUN 9.3 09/21/2017 0746   CREATININE 0.63 03/01/2018 1004   CREATININE 0.6 09/21/2017 0746   CALCIUM 8.1 (L) 03/01/2018 1004   CALCIUM 8.7 09/21/2017 0746   PROT 5.4 (L) 03/01/2018 1004   PROT 6.2 (L) 09/21/2017 0746   ALBUMIN 2.1 (L) 03/01/2018 1004   ALBUMIN 3.2 (L) 09/21/2017 0746   AST 13 03/01/2018 1004   AST 19 09/21/2017 0746   ALT 6 03/01/2018 1004   ALT 12 09/21/2017 0746   ALKPHOS 69 03/01/2018 1004   ALKPHOS 105 09/21/2017 0746   BILITOT 0.2 03/01/2018 1004   BILITOT 0.41 09/21/2017 0746   GFRNONAA >60 03/01/2018 1004   GFRAA >60 03/01/2018 1004       Component Value Date/Time   WBC 4.0 03/17/2018 1517   WBC 6.8 03/05/2018 0729   RBC 3.93 03/17/2018 1517   HGB 9.0 (L) 03/17/2018 1517   HGB 13.6 09/21/2017 0746    HCT 29.9 (L) 03/17/2018 1517   HCT 41.7 09/21/2017 0746   PLT 425 (H) 03/17/2018 1517   PLT 357 09/21/2017 0746   MCV 76.1 (L) 03/17/2018 1517   MCV 89.5 09/21/2017 0746   MCH 22.9 (L) 03/17/2018 1517   MCHC 30.1 (L) 03/17/2018 1517   RDW 22.1 (H) 03/17/2018 1517   RDW 17.1 (H) 09/21/2017 0746   LYMPHSABS 1.1 03/17/2018 1517   LYMPHSABS 1.0 09/21/2017 0746   MONOABS 0.9 03/17/2018 1517   MONOABS 0.5 09/21/2017 0746   EOSABS 0.2 03/17/2018 1517   EOSABS 0.3 09/21/2017 0746   BASOSABS 0.1 03/17/2018 1517   BASOSABS 0.0 09/21/2017 0746   -------------------------------  Imaging from last 24 hours (if applicable):  Radiology interpretation: Ct Aspiration  Result Date: 03/05/2018 INDICATION: 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 Patient presents today for repeat CT-guided aspiration for symptomatic relief. Note, this is the patient fourth CT-guided pelvic cyst aspiration - 01/26/2018 (yielding 600 cc); 12/23/2017 (yielding 550 cc); 10/30/2017 (yielding 900 cc) EXAM: CT GUIDANCE NEEDLE PLACEMENT; CT CORE BIOPSY RENAL COMPARISON:  CT-guided pelvic cyst aspiration - 01/26/2018 (yielding 600 cc); 12/23/2017 (yielding 550 cc); 10/30/2017 (yielding 900 cc); pelvic CT-12/23/2017 MEDICATIONS: None ANESTHESIA/SEDATION: Moderate (conscious) sedation was employed during this procedure. A total of Versed 2.5 mg and Fentanyl 150 mcg was administered intravenously. Moderate Sedation Time: 29 minutes. The patient's level of consciousness and vital signs were monitored continuously by radiology nursing throughout the procedure under my direct supervision. CONTRAST:  None COMPLICATIONS:  None immediate. PROCEDURE: Informed written consent was obtained from the patient after a discussion of the risks, benefits and alternatives to treatment. The patient was placed prone on the CT gantry and a pre procedural CT was performed re-demonstrating the known complex  pelvic cyst measuring approximately 15.5 x 11.5 cm (image 22, series 2). The procedure was planned. A timeout was performed prior to the initiation of the procedure. The skin overlying the left buttocks was prepped and draped in the usual sterile fashion. The overlying soft tissues were anesthetized with 1% lidocaine with epinephrine. Appropriate trajectory was planned with the use of a 22 gauge spinal needle. An 18 gauge trocar needle was advanced into the dominant component of the pelvic fluid collection and a short Amplatz super stiff wire was coiled within the collection. Appropriate positioning was confirmed with a limited CT scan. The trocar needle was exchanged for a 15 cm Yueh sheath catheter. Approximately 500 cc of dark red fluid was aspirated from this dominant component of the complex cystic mass. Ultimately 2 additional complex cystic components were separately targeted utilizing the above technique yielding another 250 cc of similar-appearing dark red fluid. Postprocedural imaging was obtained demonstrated significant reduction in the overall size of the complex cystic mass, measuring approximately 12.1 x 8.1 cm on postprocedural CT scan (image 9, series 6). Superficial hemostasis was achieved with manual compression. A dressing was placed. The patient tolerated the procedure well without immediate post procedural complication. IMPRESSION: Successful CT-guided aspiration of a total of approximately 750 cc of dark red fluid from 3 dominant separate components of complex symptomatic cystic pelvic mass. Patient again reported marked improvement in her pelvic and lower extremity pain following the pelvic cyst aspiration. Electronically Signed   By: Sandi Mariscal M.D.   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 Patient presents today for repeat CT-guided aspiration for symptomatic  relief. Note, this is the patient fourth CT-guided pelvic cyst aspiration - 01/26/2018 (yielding 600 cc); 12/23/2017 (yielding 550 cc); 10/30/2017 (yielding 900 cc) EXAM: CT GUIDANCE NEEDLE PLACEMENT; CT CORE BIOPSY RENAL COMPARISON:  CT-guided pelvic cyst aspiration - 01/26/2018 (yielding 600 cc); 12/23/2017 (yielding 550 cc); 10/30/2017 (yielding 900 cc); pelvic CT-12/23/2017 MEDICATIONS: None ANESTHESIA/SEDATION: Moderate (conscious) sedation was employed during this procedure. A total of Versed 2.5 mg and Fentanyl 150 mcg was administered intravenously. Moderate Sedation Time: 29 minutes. The patient's level of consciousness and vital signs were monitored continuously by radiology nursing throughout the procedure under my direct supervision. CONTRAST:  None COMPLICATIONS: None immediate. PROCEDURE: Informed written consent was obtained from the patient after a discussion of the risks, benefits and alternatives to treatment. The patient was placed prone on the CT gantry and a pre procedural CT was performed re-demonstrating the known complex pelvic cyst measuring approximately 15.5 x 11.5 cm (image 22, series 2). The procedure was planned. A timeout was performed prior to the initiation of the procedure. The skin overlying the left buttocks was prepped and draped in the usual sterile fashion. The overlying soft tissues were anesthetized with 1% lidocaine with epinephrine. Appropriate trajectory was planned with the use of a 22 gauge spinal needle. An 18 gauge trocar needle was advanced into the dominant component of the pelvic fluid collection and a short Amplatz super stiff wire was coiled within the collection. Appropriate positioning was confirmed with a limited CT scan. The trocar needle was exchanged for a 15  cm Yueh sheath catheter. Approximately 500 cc of dark red fluid was aspirated from this dominant component of the complex cystic mass. Ultimately 2 additional complex cystic components were separately  targeted utilizing the above technique yielding another 250 cc of similar-appearing dark red fluid. Postprocedural imaging was obtained demonstrated significant reduction in the overall size of the complex cystic mass, measuring approximately 12.1 x 8.1 cm on postprocedural CT scan (image 9, series 6). Superficial hemostasis was achieved with manual compression. A dressing was placed. The patient tolerated the procedure well without immediate post procedural complication. IMPRESSION: Successful CT-guided aspiration of a total of approximately 750 cc of dark red fluid from 3 dominant separate components of complex symptomatic cystic pelvic mass. Patient again reported marked improvement in her pelvic and lower extremity pain following the pelvic cyst aspiration. Electronically Signed   By: Sandi Mariscal M.D.   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 Patient presents today for repeat CT-guided aspiration for symptomatic relief. Note, this is the patient fourth CT-guided pelvic cyst aspiration - 01/26/2018 (yielding 600 cc); 12/23/2017 (yielding 550 cc); 10/30/2017 (yielding 900 cc) EXAM: CT GUIDANCE NEEDLE PLACEMENT; CT CORE BIOPSY RENAL COMPARISON:  CT-guided pelvic cyst aspiration - 01/26/2018 (yielding 600 cc); 12/23/2017 (yielding 550 cc); 10/30/2017 (yielding 900 cc); pelvic CT-12/23/2017 MEDICATIONS: None ANESTHESIA/SEDATION: Moderate (conscious) sedation was employed during this procedure. A total of Versed 2.5 mg and Fentanyl 150 mcg was administered intravenously. Moderate Sedation Time: 29 minutes. The patient's level of consciousness and vital signs were monitored continuously by radiology nursing throughout the procedure under my direct supervision. CONTRAST:  None COMPLICATIONS: None immediate. PROCEDURE: Informed written consent was  obtained from the patient after a discussion of the risks, benefits and alternatives to treatment. The patient was placed prone on the CT gantry and a pre procedural CT was performed re-demonstrating the known complex pelvic cyst measuring approximately 15.5 x 11.5 cm (image 22, series 2). The procedure was planned. A timeout was performed prior to the initiation of the procedure. The skin overlying the left buttocks was prepped and draped in the usual sterile fashion. The overlying soft tissues were anesthetized with 1% lidocaine with epinephrine. Appropriate trajectory was planned with the use of a 22 gauge spinal needle. An 18 gauge trocar needle was advanced into the dominant component of the pelvic fluid collection and a short Amplatz super stiff wire was coiled within the collection. Appropriate positioning was confirmed with a limited CT scan. The trocar needle was exchanged for a 15 cm Yueh sheath catheter. Approximately 500 cc of dark red fluid was aspirated from this dominant component of the complex cystic mass. Ultimately 2 additional complex cystic components were separately targeted utilizing the above technique yielding another 250 cc of similar-appearing dark red fluid. Postprocedural imaging was obtained demonstrated significant reduction in the overall size of the complex cystic mass, measuring approximately 12.1 x 8.1 cm on postprocedural CT scan (image 9, series 6). Superficial hemostasis was achieved with manual compression. A dressing was placed. The patient tolerated the procedure well without immediate post procedural complication. IMPRESSION: Successful CT-guided aspiration of a total of approximately 750 cc of dark red fluid from 3 dominant separate components of complex symptomatic cystic pelvic mass. Patient again reported marked improvement in her pelvic and lower extremity pain following the pelvic cyst aspiration. Electronically Signed   By: Jenny Reichmann  Watts M.D.   On: 03/05/2018 13:26          This case was discussed with Dr. Benay Spice.

## 2018-03-18 NOTE — Progress Notes (Signed)
These preliminary result these preliminary results were noted.  Awaiting final report.

## 2018-03-18 NOTE — Telephone Encounter (Signed)
Returned call to [employer], Therapist, sports with [facility]. Per Dr. Benay Spice ok to extend the orders for nursing and wound eval at home. Ubaldo Glassing, RN voiced understanding.

## 2018-03-18 NOTE — Telephone Encounter (Signed)
No 6/19 los °

## 2018-03-20 LAB — AEROBIC CULTURE W GRAM STAIN (SUPERFICIAL SPECIMEN): Gram Stain: NONE SEEN

## 2018-03-20 LAB — AEROBIC CULTURE  (SUPERFICIAL SPECIMEN)

## 2018-03-21 ENCOUNTER — Other Ambulatory Visit: Payer: Self-pay | Admitting: Oncology

## 2018-03-22 ENCOUNTER — Inpatient Hospital Stay: Payer: BLUE CROSS/BLUE SHIELD

## 2018-03-22 ENCOUNTER — Inpatient Hospital Stay (HOSPITAL_BASED_OUTPATIENT_CLINIC_OR_DEPARTMENT_OTHER): Payer: BLUE CROSS/BLUE SHIELD | Admitting: Oncology

## 2018-03-22 ENCOUNTER — Telehealth: Payer: Self-pay | Admitting: Oncology

## 2018-03-22 VITALS — BP 147/96 | HR 120 | Temp 98.2°F | Resp 19 | Ht 65.0 in | Wt 153.7 lb

## 2018-03-22 DIAGNOSIS — R339 Retention of urine, unspecified: Secondary | ICD-10-CM

## 2018-03-22 DIAGNOSIS — Z7901 Long term (current) use of anticoagulants: Secondary | ICD-10-CM

## 2018-03-22 DIAGNOSIS — Z95828 Presence of other vascular implants and grafts: Secondary | ICD-10-CM

## 2018-03-22 DIAGNOSIS — C786 Secondary malignant neoplasm of retroperitoneum and peritoneum: Secondary | ICD-10-CM

## 2018-03-22 DIAGNOSIS — B965 Pseudomonas (aeruginosa) (mallei) (pseudomallei) as the cause of diseases classified elsewhere: Secondary | ICD-10-CM | POA: Diagnosis not present

## 2018-03-22 DIAGNOSIS — Z933 Colostomy status: Secondary | ICD-10-CM

## 2018-03-22 DIAGNOSIS — I82412 Acute embolism and thrombosis of left femoral vein: Secondary | ICD-10-CM

## 2018-03-22 DIAGNOSIS — I2699 Other pulmonary embolism without acute cor pulmonale: Secondary | ICD-10-CM

## 2018-03-22 DIAGNOSIS — R1900 Intra-abdominal and pelvic swelling, mass and lump, unspecified site: Secondary | ICD-10-CM

## 2018-03-22 DIAGNOSIS — C20 Malignant neoplasm of rectum: Secondary | ICD-10-CM

## 2018-03-22 DIAGNOSIS — M549 Dorsalgia, unspecified: Secondary | ICD-10-CM | POA: Diagnosis not present

## 2018-03-22 DIAGNOSIS — D509 Iron deficiency anemia, unspecified: Secondary | ICD-10-CM

## 2018-03-22 DIAGNOSIS — C19 Malignant neoplasm of rectosigmoid junction: Secondary | ICD-10-CM

## 2018-03-22 DIAGNOSIS — R300 Dysuria: Secondary | ICD-10-CM

## 2018-03-22 DIAGNOSIS — M7989 Other specified soft tissue disorders: Secondary | ICD-10-CM

## 2018-03-22 DIAGNOSIS — C787 Secondary malignant neoplasm of liver and intrahepatic bile duct: Secondary | ICD-10-CM

## 2018-03-22 HISTORY — DX: Retention of urine, unspecified: R33.9

## 2018-03-22 LAB — CMP (CANCER CENTER ONLY)
ALT: <6 U/L (ref 0–55)
ANION GAP: 8 (ref 3–11)
AST: 16 U/L (ref 5–34)
Albumin: 2.4 g/dL — ABNORMAL LOW (ref 3.5–5.0)
Alkaline Phosphatase: 81 U/L (ref 40–150)
BUN: 13 mg/dL (ref 7–26)
CO2: 23 mmol/L (ref 22–29)
Calcium: 8.6 mg/dL (ref 8.4–10.4)
Chloride: 101 mmol/L (ref 98–109)
Creatinine: 0.74 mg/dL (ref 0.60–1.10)
GFR, Est AFR Am: >60 mL/min (ref >60)
GFR, Estimated: >60 mL/min (ref >60)
GLUCOSE: 124 mg/dL (ref 70–140)
POTASSIUM: 4.1 mmol/L (ref 3.5–5.1)
SODIUM: 132 mmol/L — AB (ref 136–145)
TOTAL PROTEIN: 6 g/dL — AB (ref 6.4–8.3)
Total Bilirubin: <0.2 mg/dL — ABNORMAL LOW (ref 0.2–1.2)

## 2018-03-22 LAB — CBC WITH DIFFERENTIAL (CANCER CENTER ONLY)
BASOS ABS: 0 10*3/uL (ref 0.0–0.1)
Basophils Relative: 1 %
Eosinophils Absolute: 0 10*3/uL (ref 0.0–0.5)
Eosinophils Relative: 0 %
HEMATOCRIT: 31.6 % — AB (ref 34.8–46.6)
HEMOGLOBIN: 9.7 g/dL — AB (ref 11.6–15.9)
LYMPHS PCT: 10 %
Lymphs Abs: 0.5 10*3/uL — ABNORMAL LOW (ref 0.9–3.3)
MCH: 22.8 pg — ABNORMAL LOW (ref 25.1–34.0)
MCHC: 30.7 g/dL — ABNORMAL LOW (ref 31.5–36.0)
MCV: 74.4 fL — AB (ref 79.5–101.0)
MONOS PCT: 20 %
Monocytes Absolute: 1 10*3/uL — ABNORMAL HIGH (ref 0.1–0.9)
NEUTROS ABS: 3.3 10*3/uL (ref 1.5–6.5)
Neutrophils Relative %: 69 %
Platelet Count: 539 10*3/uL — ABNORMAL HIGH (ref 145–400)
RBC: 4.25 MIL/uL (ref 3.70–5.45)
RDW: 22.2 % — ABNORMAL HIGH (ref 11.2–14.5)
WBC Count: 4.9 10*3/uL (ref 3.9–10.3)

## 2018-03-22 MED ORDER — SODIUM CHLORIDE 0.9 % IJ SOLN
10.0000 mL | INTRAMUSCULAR | Status: DC | PRN
  Administered 2018-03-22: 10 mL via INTRAVENOUS
  Filled 2018-03-22: qty 10

## 2018-03-22 NOTE — Patient Instructions (Signed)
Implanted Port Home Guide An implanted port is a type of central line that is placed under the skin. Central lines are used to provide IV access when treatment or nutrition needs to be given through a person's veins. Implanted ports are used for long-term IV access. An implanted port may be placed because:  You need IV medicine that would be irritating to the small veins in your hands or arms.  You need long-term IV medicines, such as antibiotics.  You need IV nutrition for a long period.  You need frequent blood draws for lab tests.  You need dialysis.  Implanted ports are usually placed in the chest area, but they can also be placed in the upper arm, the abdomen, or the leg. An implanted port has two main parts:  Reservoir. The reservoir is round and will appear as a small, raised area under your skin. The reservoir is the part where a needle is inserted to give medicines or draw blood.  Catheter. The catheter is a thin, flexible tube that extends from the reservoir. The catheter is placed into a large vein. Medicine that is inserted into the reservoir goes into the catheter and then into the vein.  How will I care for my incision site? Do not get the incision site wet. Bathe or shower as directed by your health care provider. How is my port accessed? Special steps must be taken to access the port:  Before the port is accessed, a numbing cream can be placed on the skin. This helps numb the skin over the port site.  Your health care provider uses a sterile technique to access the port. ? Your health care provider must put on a mask and sterile gloves. ? The skin over your port is cleaned carefully with an antiseptic and allowed to dry. ? The port is gently pinched between sterile gloves, and a needle is inserted into the port.  Only "non-coring" port needles should be used to access the port. Once the port is accessed, a blood return should be checked. This helps ensure that the port  is in the vein and is not clogged.  If your port needs to remain accessed for a constant infusion, a clear (transparent) bandage will be placed over the needle site. The bandage and needle will need to be changed every week, or as directed by your health care provider.  Keep the bandage covering the needle clean and dry. Do not get it wet. Follow your health care provider's instructions on how to take a shower or bath while the port is accessed.  If your port does not need to stay accessed, no bandage is needed over the port.  What is flushing? Flushing helps keep the port from getting clogged. Follow your health care provider's instructions on how and when to flush the port. Ports are usually flushed with saline solution or a medicine called heparin. The need for flushing will depend on how the port is used.  If the port is used for intermittent medicines or blood draws, the port will need to be flushed: ? After medicines have been given. ? After blood has been drawn. ? As part of routine maintenance.  If a constant infusion is running, the port may not need to be flushed.  How long will my port stay implanted? The port can stay in for as long as your health care provider thinks it is needed. When it is time for the port to come out, surgery will be   done to remove it. The procedure is similar to the one performed when the port was put in. When should I seek immediate medical care? When you have an implanted port, you should seek immediate medical care if:  You notice a bad smell coming from the incision site.  You have swelling, redness, or drainage at the incision site.  You have more swelling or pain at the port site or the surrounding area.  You have a fever that is not controlled with medicine.  This information is not intended to replace advice given to you by your health care provider. Make sure you discuss any questions you have with your health care provider. Document  Released: 09/15/2005 Document Revised: 02/21/2016 Document Reviewed: 05/23/2013 Elsevier Interactive Patient Education  2017 [employer]  

## 2018-03-22 NOTE — Telephone Encounter (Signed)
Appointments scheduled AVS/Calendar printed per 6/24 los °

## 2018-03-22 NOTE — Progress Notes (Signed)
[place] OFFICE PROGRESS NOTE   Diagnosis: Colon cancer  INTERVAL HISTORY:   Carolyn Barton returns as scheduled.  She completed another cycle of FOLFOX/Avastin on 03/01/2018.  She tolerated the chemotherapy well.  No neuropathy symptoms. She underwent CT aspiration of the pelvic mass on 03/05/2018 for 750 cc of fluid.  She reports improvement in leg pain following this procedure.  The leg swelling has not improved.  She has developed increased back and leg pain over the past week. She has been unable to urinate today.  She feels like she needs to urinate. She was seen in the symptom management clinic on 03/17/2018.  She was placed on Bactrim for areas of skin breakdown at the left lower leg.  A culture returned positive for pseudomonas aeruginosa.  She was transfused with packed red blood cells on 03/04/2018 Objective:  Vital signs in last 24 hours:  Blood pressure (!) 147/96, pulse (!) 120, temperature 98.2 F (36.8 C), temperature source Oral, resp. rate 19, height _0  (1.651 m), weight 153 lb 11.2 oz (69.7 kg), SpO2 100 %.    Resp: Lungs clear bilaterally Cardio: Regular rate and rhythm GI: Marked distention of the lower abdomen.  Left lower quadrant colostomy Vascular: Edema throughout the left greater than right leg  Skin: Several areas of superficial skin breakdown at the left tibia.  No erythema.  Portacath/PICC-without erythema  Lab Results:  Lab Results  Component Value Date   WBC 4.9 03/22/2018   HGB 9.7 (L) 03/22/2018   HCT 31.6 (L) 03/22/2018   MCV 74.4 (L) 03/22/2018   PLT 539 (H) 03/22/2018   NEUTROABS 3.3 03/22/2018    CMP  Lab Results  Component Value Date   NA 133 (L) 03/01/2018   K 3.9 03/01/2018   CL 103 03/01/2018   CO2 24 03/01/2018   GLUCOSE 92 03/01/2018   BUN 14 03/01/2018   CREATININE 0.63 03/01/2018   CALCIUM 8.1 (L) 03/01/2018   PROT 5.4 (L) 03/01/2018   ALBUMIN 2.1 (L) 03/01/2018   AST 13 03/01/2018   ALT 6 03/01/2018   ALKPHOS 69 03/01/2018   BILITOT 0.2 03/01/2018   GFRNONAA >60 03/01/2018   GFRAA >60 03/01/2018    Lab Results  Component Value Date   CEA1 2.29 01/04/2018     Medications: I have reviewed the patient's current medications.   Assessment/Plan: 1.Metastatic colorectal cancer-status post an exploratory laparotomy 03/03/2016 revealing a proximal rectal mass, peritoneal carcinomatosis, and liver metastases  Biopsy of peritoneal nodules 03/03/2016 confirmed metastatic adenocarcinoma consistent with a colon primary  Foundation 1 testing-MSI-stable, tumor mutation burden-low, no BRAF NRAS or KRAS mutation  Cycle 1 FOLFIRI/PANITUMUMAB 04/14/2016  Cycle 2 FOLFIRI/panitumumab 04/28/2016  Cycle 3 FOLFIRI/panitumumab 05/12/2016  Cycle 4 FOLFIRI/panitumumab 05/27/2016  Cycle 5 FOLFIRI/panitumumab 06/09/2016  Restaging CT abdomen/pelvis 06/20/2016-no evidence of disease progression, decreased left hepatic lesion  Cycle 6 FOLFIRI/panitumumab 06/23/2016  Cycle 7 FOLFIRI/panitumumab 07/07/2016  Cycle 8 FOLFIRI/panitumumab 07/21/2016  Cycle 9 FOLFIRI/PANITUMUMAB 08/04/2016  Cycle 10 FOLFIRI/panitumumab 08/18/2016  Restaging CT 08/29/2016 with interval decrease in the size of the medial segment left liver lesion. Lesion identified previously in the rectosigmoid colon not evident on the current study.  5-FU/PANITUMUMAB 09/01/2016  Xeloda 7 days on/7 days off and PANITUMUMAB every 3 weeks 09/15/2016 (awaiting insurance approval for Xeloda 09/15/2016)  CT abdomen/pelvis 12/25/2016-unchanged 5 mm right hepatic lesion, resolution of medial segment left liver lesion, no new liver lesion. Residual soft tissue fullness in the sigmoid , persistent distal esophageal wall thickening  Continuationof PANITUMUMAB every 3 weeks and Xeloda 7 days on/7 days off  CT 06/19/2017-new cystic/solid lesion in the left adnexa  Xeloda/panitumumab continued  CT 10/08/2016-progression of cystic pelvic  mass  Cycle 1 FOLFIRI/Panitumumab 10/21/2017  Cystic pelvic mass aspirated 10/30/2017  Cycle 2 FOLFIRI/panitumumab 11/09/2017  Cycle 3 FOLFIRI/panitumumab 11/23/2017  Cycle 4 FOLFIRI/Panitumumab 12/07/2017  Cycle 5 FOLFIRI/panitumumab 12/21/2017  CT pelvis 12/23/2017- complex cystic pelvic mass with mass-effect on the pelvic vasculature  CT aspiration of cystic pelvic mass 12/23/2017  Cycle 1 FOLFOX/Avastin 01/04/2018  Cycle 2 FOLFOX/Avastin 01/18/2018  Cycle 3 FOLFOX/Avastin 02/08/2018  Cycle 4 FOLFOX/Avastin 03/01/2018  2.History of aBowel obstruction secondary to #1  3. Chronic back pain-maintained on methadone  4. Essential thrombocytosis  5. Early obstruction of the left ureter noted at the time of surgery 03/03/2016-Dr. Tannenbaum placed a left double-J stent 03/18/2016  New onset right hydronephrosis 10/11/2017  Right ureter stent placement 10/13/2017  6. Abdominal wall cellulitis 03/10/2016, blood cultures positive for coagulase negative staphylococcus-methicillin-resistant, status post treatment with vancomycin and Bactrim   8. Port-A-Cath placement 04/10/2016, interventional radiology  9. Iron deficiency anemia. Feraheme 11/05/2015 , 11/12/2015,and 04/14/2016-persistent anemia and red cell microcytosis, oral iron resumed 11/17/2016-stable  10. Pain/tenderness left posterior iliac region-resolved  11. Hypokalemia-started on potassium replacement 06/09/2016  12. History of skin rashand paronychiasecondary to [facility], she continues minocycline, moisturizers, and fluticasone as needed  13.Acute onset right low back pain 10/11/2017-likely secondary to obstruction of the right kidney;left ureter stent exchange and right ureter stent placement 10/13/2017  14.Left leg pain and edema-bilateral lower extremity venous Doppler negative for DVT1/15/2019 and 10/19/2017. Left common femoral DVT noted on CT venogram 10/27/2017;CT aspiration of  pelvic cystic mass 10/30/2017 with subsequent marked improvement of leg edema.  15. Admission 10/22/2017 with bilateral pulmonary embolism, heparin drip initiated, converted to Lovenox 10/23/2017, converted to Xarelto during the 10/27/2017 hospital admission  16. Admission 10/27/2017 with increased back/leg pain and a fever-blood and urine cultures positive fora resistant E. coli    [name] has metastatic colon cancer.  She has completed 4 cycles of FOLFOX chemotherapy.  The pelvic mass has not resolved and her clinical status has worsened over the past few weeks.  We held chemotherapy today and she will be referred for a restaging CT.  She has urinary retention today.  This could be related to a neurogenic bladder or outlet obstruction from the pelvic mass.  The oncology RN was unable to place a Foley catheter as she met resistance.  We will refer her to the urology clinic today.  Carolyn Barton will return for an office visit on 03/29/2018.  25 minutes were spent with the patient today.  The majority of the time was used for counseling and coordination of care.  [name] Coder, MD  03/22/2018  10:10 AM

## 2018-03-24 ENCOUNTER — Inpatient Hospital Stay: Payer: BLUE CROSS/BLUE SHIELD

## 2018-03-25 ENCOUNTER — Telehealth: Payer: Self-pay | Admitting: *Deleted

## 2018-03-25 NOTE — Telephone Encounter (Signed)
Telephone call to patient for an update on previous triage call. Patient reports she took 1/2 bottle of the mag citrate and is currently drinking the second half. No bm as of now. Current home health nurse is caring for wounds. Advised to complete the second dose and wait 4 hours. If still no results patient should try a FLEETS enema. Patient was advised to report to ED if still no results and she is still in pain.   Spoke to nurse who reports wounds are still draining yellowish green discharge and have a foul odor. Not progressing. She recommends new referral to wound care center for further assessment and treatment.   Dr Benay Spice advised. New order written for referral to wound center. Call to home care nurse to have a PRN visit to assist patient with FLEETS enema via ostomy. Patient was advised to return to ED if pain worsens.

## 2018-03-25 NOTE — Telephone Encounter (Signed)
Pt called requesting a call back from nurse about constipation problem.  Spoke with pt and was informed that last bm was on Monday.  Stated she is taking Miralax BID, and drinking lots of fluids as tolerated.  Asked if pt was feeling bloated and/or nauseated.  Pt stated " yes , some ";  Stated took antiemetics with relief of nausea.   Instructed pt to take Magnesium Citrate - drink half bottle now , and wait 2 hours.  If no results, pt can take the rest of bottle.  Instructed pt to call office back early if still no results.   Message routed to Dr. Benay Spice;  Clarise Cruz, RN desk nurse notified. Pt's    Phone     [phone].

## 2018-03-26 ENCOUNTER — Other Ambulatory Visit: Payer: Self-pay | Admitting: *Deleted

## 2018-03-26 ENCOUNTER — Ambulatory Visit (HOSPITAL_COMMUNITY)
Admission: RE | Admit: 2018-03-26 | Discharge: 2018-03-26 | Disposition: A | Discharge status: Home / Self Care | Payer: BLUE CROSS/BLUE SHIELD | Source: Ambulatory Visit | Attending: Oncology | Admitting: Oncology

## 2018-03-26 DIAGNOSIS — C785 Secondary malignant neoplasm of large intestine and rectum: Secondary | ICD-10-CM | POA: Diagnosis not present

## 2018-03-26 DIAGNOSIS — C19 Malignant neoplasm of rectosigmoid junction: Secondary | ICD-10-CM

## 2018-03-26 DIAGNOSIS — Z960 Presence of urogenital implants: Secondary | ICD-10-CM | POA: Insufficient documentation

## 2018-03-26 DIAGNOSIS — R601 Generalized edema: Secondary | ICD-10-CM | POA: Diagnosis not present

## 2018-03-26 DIAGNOSIS — L97921 Non-pressure chronic ulcer of unspecified part of left lower leg limited to breakdown of skin: Secondary | ICD-10-CM

## 2018-03-26 DIAGNOSIS — L03116 Cellulitis of left lower limb: Secondary | ICD-10-CM

## 2018-03-26 DIAGNOSIS — N1330 Unspecified hydronephrosis: Secondary | ICD-10-CM | POA: Insufficient documentation

## 2018-03-26 MED ORDER — IOPAMIDOL (ISOVUE-300) INJECTION 61%
100.0000 mL | Freq: Once | INTRAVENOUS | Status: AC | PRN
  Administered 2018-03-26: 80 mL via INTRAVENOUS

## 2018-03-26 MED ORDER — IOPAMIDOL (ISOVUE-300) INJECTION 61%
INTRAVENOUS | Status: AC
  Filled 2018-03-26: qty 30

## 2018-03-26 MED ORDER — IOPAMIDOL (ISOVUE-300) INJECTION 61%
INTRAVENOUS | Status: AC
  Filled 2018-03-26: qty 100

## 2018-03-26 MED ORDER — IOPAMIDOL (ISOVUE-300) INJECTION 61%
30.0000 mL | Freq: Once | INTRAVENOUS | Status: AC | PRN
  Administered 2018-03-26: 30 mL via ORAL

## 2018-03-26 NOTE — Progress Notes (Signed)
Home care wound nurse has recommended patient return to wound care center. Cellulitis of left leg is draining foul odor, green discharge. This drainage has increased recently.

## 2018-03-29 ENCOUNTER — Inpatient Hospital Stay: Payer: BLUE CROSS/BLUE SHIELD | Attending: Oncology | Admitting: Nurse Practitioner

## 2018-03-29 ENCOUNTER — Encounter: Payer: Self-pay | Admitting: Nurse Practitioner

## 2018-03-29 VITALS — BP 127/90 | HR 111 | Temp 98.7°F | Resp 18 | Ht 65.0 in | Wt 139.9 lb

## 2018-03-29 DIAGNOSIS — Z8614 Personal history of Methicillin resistant Staphylococcus aureus infection: Secondary | ICD-10-CM | POA: Diagnosis not present

## 2018-03-29 DIAGNOSIS — C189 Malignant neoplasm of colon, unspecified: Secondary | ICD-10-CM | POA: Insufficient documentation

## 2018-03-29 DIAGNOSIS — Z86711 Personal history of pulmonary embolism: Secondary | ICD-10-CM | POA: Diagnosis not present

## 2018-03-29 DIAGNOSIS — Z933 Colostomy status: Secondary | ICD-10-CM | POA: Insufficient documentation

## 2018-03-29 DIAGNOSIS — Z79891 Long term (current) use of opiate analgesic: Secondary | ICD-10-CM | POA: Diagnosis not present

## 2018-03-29 DIAGNOSIS — D509 Iron deficiency anemia, unspecified: Secondary | ICD-10-CM | POA: Insufficient documentation

## 2018-03-29 DIAGNOSIS — F5089 Other specified eating disorder: Secondary | ICD-10-CM | POA: Diagnosis not present

## 2018-03-29 DIAGNOSIS — E876 Hypokalemia: Secondary | ICD-10-CM

## 2018-03-29 DIAGNOSIS — G8929 Other chronic pain: Secondary | ICD-10-CM | POA: Diagnosis not present

## 2018-03-29 DIAGNOSIS — D649 Anemia, unspecified: Secondary | ICD-10-CM | POA: Insufficient documentation

## 2018-03-29 DIAGNOSIS — M549 Dorsalgia, unspecified: Secondary | ICD-10-CM | POA: Insufficient documentation

## 2018-03-29 DIAGNOSIS — Z9221 Personal history of antineoplastic chemotherapy: Secondary | ICD-10-CM | POA: Diagnosis not present

## 2018-03-29 DIAGNOSIS — C786 Secondary malignant neoplasm of retroperitoneum and peritoneum: Secondary | ICD-10-CM | POA: Insufficient documentation

## 2018-03-29 DIAGNOSIS — R0600 Dyspnea, unspecified: Secondary | ICD-10-CM | POA: Insufficient documentation

## 2018-03-29 DIAGNOSIS — Z7901 Long term (current) use of anticoagulants: Secondary | ICD-10-CM | POA: Diagnosis not present

## 2018-03-29 DIAGNOSIS — L270 Generalized skin eruption due to drugs and medicaments taken internally: Secondary | ICD-10-CM | POA: Insufficient documentation

## 2018-03-29 DIAGNOSIS — D701 Agranulocytosis secondary to cancer chemotherapy: Secondary | ICD-10-CM | POA: Insufficient documentation

## 2018-03-29 DIAGNOSIS — K5900 Constipation, unspecified: Secondary | ICD-10-CM | POA: Diagnosis not present

## 2018-03-29 DIAGNOSIS — C787 Secondary malignant neoplasm of liver and intrahepatic bile duct: Secondary | ICD-10-CM | POA: Insufficient documentation

## 2018-03-29 DIAGNOSIS — D473 Essential (hemorrhagic) thrombocythemia: Secondary | ICD-10-CM

## 2018-03-29 DIAGNOSIS — N131 Hydronephrosis with ureteral stricture, not elsewhere classified: Secondary | ICD-10-CM | POA: Diagnosis not present

## 2018-03-29 DIAGNOSIS — C19 Malignant neoplasm of rectosigmoid junction: Secondary | ICD-10-CM

## 2018-03-29 NOTE — Progress Notes (Addendum)
[place] OFFICE PROGRESS NOTE   Diagnosis: Colon cancer  INTERVAL HISTORY:   Ms. Depriest returns as scheduled.  Reports increased left low back pain.  She is taking morphine on a more frequent basis.  She had recent constipation.  She took a laxative and then developed diarrhea.  Bowels now moving regularly overall.  Appetite is diminished.  She has lost some weight.  Leg edema overall is improved.  She continues dressing changes to the left lower leg.  Abdomen continues to be distended.  She has an indwelling Foley catheter.  She reports follow-up at the urology center later today.  Objective:  Vital signs in last 24 hours:  Blood pressure 127/90, pulse (!) 111, temperature 98.7 F (37.1 C), temperature source Oral, resp. rate 18, height '5\' 5"'$  (1.651 m), weight 139 lb 14.4 oz (63.5 kg), SpO2 100 %.    HEENT: No thrush or ulcers. Resp: Lungs clear bilaterally. Cardio: Regular rate and rhythm. GI: No hepatomegaly.  Left lower quadrant colostomy.  Soft stool in collection bag.  Lower abdomen with firm fullness. Vascular: Edema throughout the left greater than right leg.  Left lower leg is bandaged. Skin: Multiple, clean appearing, superficial ulcerations along the left pretibial region. Port-A-Cath without erythema.  Lab Results:  Lab Results  Component Value Date   WBC 4.9 03/22/2018   HGB 9.7 (L) 03/22/2018   HCT 31.6 (L) 03/22/2018   MCV 74.4 (L) 03/22/2018   PLT 539 (H) 03/22/2018   NEUTROABS 3.3 03/22/2018    Imaging:  No results found.  Medications: I have reviewed the patient's current medications.  Assessment/Plan: 1.Metastatic colorectal cancer-status post an exploratory laparotomy 03/03/2016 revealing a proximal rectal mass, peritoneal carcinomatosis, and liver metastases  Biopsy of peritoneal nodules 03/03/2016 confirmed metastatic adenocarcinoma consistent with a colon primary  Foundation 1 testing-MSI-stable, tumor mutation burden-low, no  BRAF NRAS or KRAS mutation  Cycle 1 FOLFIRI/PANITUMUMAB 04/14/2016  Cycle 2 FOLFIRI/panitumumab 04/28/2016  Cycle 3 FOLFIRI/panitumumab 05/12/2016  Cycle 4 FOLFIRI/panitumumab 05/27/2016  Cycle 5 FOLFIRI/panitumumab 06/09/2016  Restaging CT abdomen/pelvis 06/20/2016-no evidence of disease progression, decreased left hepatic lesion  Cycle 6 FOLFIRI/panitumumab 06/23/2016  Cycle 7 FOLFIRI/panitumumab 07/07/2016  Cycle 8 FOLFIRI/panitumumab 07/21/2016  Cycle 9 FOLFIRI/PANITUMUMAB 08/04/2016  Cycle 10 FOLFIRI/panitumumab 08/18/2016  Restaging CT 08/29/2016 with interval decrease in the size of the medial segment left liver lesion. Lesion identified previously in the rectosigmoid colon not evident on the current study.  5-FU/PANITUMUMAB 09/01/2016  Xeloda 7 days on/7 days off and PANITUMUMAB every 3 weeks 09/15/2016 (awaiting insurance approval for Xeloda 09/15/2016)  CT abdomen/pelvis 12/25/2016-unchanged 5 mm right hepatic lesion, resolution of medial segment left liver lesion, no new liver lesion. Residual soft tissue fullness in the sigmoid , persistent distal esophageal wall thickening  Continuationof PANITUMUMAB every 3 weeks and Xeloda 7 days on/7 days off  CT 06/19/2017-new cystic/solid lesion in the left adnexa  Xeloda/panitumumab continued  CT 10/08/2016-progression of cystic pelvic mass  Cycle 1 FOLFIRI/Panitumumab 10/21/2017  Cystic pelvic mass aspirated 10/30/2017  Cycle 2 FOLFIRI/panitumumab 11/09/2017  Cycle 3 FOLFIRI/panitumumab 11/23/2017  Cycle 4 FOLFIRI/Panitumumab 12/07/2017  Cycle 5 FOLFIRI/panitumumab 12/21/2017  CT pelvis 12/23/2017- complex cystic pelvic mass with mass-effect on the pelvic vasculature  CT aspiration of cystic pelvic mass 12/23/2017  Cycle 1 FOLFOX/Avastin 01/04/2018  Cycle 2 FOLFOX/Avastin 01/18/2018  Cycle 3 FOLFOX/Avastin 02/08/2018  Cycle 4 FOLFOX/Avastin 03/01/2018  CT abdomen/pelvis 03/26/2018-interval increase in size of solid  and cystic mass within the pelvis.  2.History of aBowel obstruction secondary  to #1  3. Chronic back pain-maintained on methadone  4. Essential thrombocytosis  5. Early obstruction of the left ureter noted at the time of surgery 03/03/2016-Dr. Tannenbaum placed a left double-J stent 03/18/2016  New onset right hydronephrosis 10/11/2017  Right ureter stent placement 10/13/2017  6. Abdominal wall cellulitis 03/10/2016, blood cultures positive for coagulase negative staphylococcus-methicillin-resistant, status post treatment with vancomycin and Bactrim   8. Port-A-Cath placement 04/10/2016, interventional radiology  9. Iron deficiency anemia. Feraheme 11/05/2015 , 11/12/2015,and 04/14/2016-persistent anemia and red cell microcytosis, oral iron resumed 11/17/2016-stable  10. Pain/tenderness left posterior iliac region-resolved  11. Hypokalemia-started on potassium replacement 06/09/2016  12. History of skin rashand paronychiasecondary to [facility], she continues minocycline, moisturizers, and fluticasone as needed  13.Acute onset right low back pain 10/11/2017-likely secondary to obstruction of the right kidney;left ureter stent exchange and right ureter stent placement 10/13/2017  14.Left leg pain and edema-bilateral lower extremity venous Doppler negative for DVT1/15/2019 and 10/19/2017. Left common femoral DVT noted on CT venogram 10/27/2017;CT aspiration of pelvic cystic mass 10/30/2017 with subsequent marked improvement of leg edema.  15. Admission 10/22/2017 with bilateral pulmonary embolism, heparin drip initiated, converted to Lovenox 10/23/2017, converted to Xarelto during the 10/27/2017 hospital admission  16. Admission 10/27/2017 with increased back/leg pain and a fever-blood and urine cultures positive fora resistant E. coli   Disposition: Ms. Kossman appears unchanged.  She continues to have a poor performance status.  Dr. Benay Spice  reviewed the recent restaging CT results with Ms. Chisum and her family.  The mass within the pelvis is larger.  Dr. Benay Spice recommends discontinuation of FOLFOX/Avastin.  We discussed other options to include Lonsurf, supportive care, referral for consideration of enrollment on a clinical trial.  She would like to proceed with Lonsurf.  We reviewed potential toxicities including nausea, diarrhea, bone marrow toxicity.  Plan that she will begin cycle 1 on 04/05/2018.  She will return for a follow-up visit in 2 weeks.  She will contact the office in the interim with any problems.  Patient seen with Dr. Benay Spice.  25 minutes were spent face-to-face at today's visit with the majority of that time involved in counseling/coordination of care.  Ned Card ANP/GNP-BC   03/29/2018  11:06 AM  This was a shared visit with Ned Card.  Ms. Fulbright was interviewed and examined.  We discussed the CT results and treatment options with Ms. Dunnigan and her family.  Her clinical status has declined while on FOLFOX.  She understands no therapy will be curative.  We discussed comfort care/hospice, standard salvage therapy, and referring her to [facility] to consider a clinical trial.  She would like to proceed with standard salvage therapy.  I recommend a trial of TAS-102.  We reviewed potential toxicities associated with this agent and she agrees to proceed.  We will follow her clinical status while on TAS-102 and plan for a restaging CT in approximately 3 months.  [name], MD

## 2018-03-31 ENCOUNTER — Telehealth: Payer: Self-pay | Admitting: Pharmacist

## 2018-03-31 DIAGNOSIS — C19 Malignant neoplasm of rectosigmoid junction: Secondary | ICD-10-CM

## 2018-03-31 MED ORDER — TRIFLURIDINE-TIPIRACIL 20-8.19 MG PO TABS: ORAL_TABLET | ORAL | 0 refills | Status: DC

## 2018-03-31 NOTE — Telephone Encounter (Signed)
Oral Oncology Pharmacist Encounter  Received notification from the pharmacy that prior authorization for Carolyn Barton is required PA has been submitted on cover my meds Key: [id number] Status is pending.  This encounter will be updated until final determination.  Johny Drilling, PharmD, BCPS, BCOP  03/31/2018 11:06 AM Oral Oncology Clinic [phone]

## 2018-03-31 NOTE — Telephone Encounter (Signed)
Oral Oncology Pharmacist Encounter  Received new prescription for Lonsurf (trifluridine / tipiracil) for the treatment of Kras wild type, metastatic colon cancer, planned duration until disease progression or unacceptable toxicity.  Patient has been treated extensively for her colon cancer She received FOLFIRI plus panitumumab x 10 cycles, July-Nov 2017 Patient was then placed on maintenance 5-FU (eventually changed to Xeloda) plus panitumumab from Dec 2017-Dec 2018 when progression was noted. Therapy was changed back to FOLFIRI plus panitumumab from Jan 2019-March 2019 when progression was again noted. Therapy was then changed to FOLFOX plus bevacizumab from April 2019-June 2019, progression is again noted.  Labs from 03/22/18 assessed, OK for treatment.  Current medication list in Epic reviewed, no DDIs with Lonsurf identified.  Prescription has been e-scribed to the [facility] for benefits analysis and approval.  Oral Oncology Clinic will continue to follow for insurance authorization, copayment issues, initial counseling and start date.  Johny Drilling, PharmD, BCPS, BCOP  03/31/2018 8:42 AM Oral Oncology Clinic [phone]

## 2018-04-02 ENCOUNTER — Other Ambulatory Visit: Payer: Self-pay | Admitting: Urology

## 2018-04-02 MED FILL — LONSURF 20 MG-8.19 MG TAB: 20-8.19 | 28 days supply | Qty: 60 | Fill #0

## 2018-04-02 NOTE — Telephone Encounter (Signed)
Oral Oncology Pharmacist Encounter  Received prior authorization approval for Lonsurf from [facility] Ref# [id number] Effective dates: 03/31/2018-03/30/2019  Johny Drilling, PharmD, BCPS, BCOP  04/02/2018 9:43 AM Oral Oncology Clinic [phone]

## 2018-04-02 NOTE — Telephone Encounter (Signed)
Oral Chemotherapy Pharmacist Encounter     I spoke with patient for overview of: Lonsurf (trifluridine/tipiracil).    Counseled patient on administration, dosing, side effects, monitoring, drug-food interactions, safe handling, storage, and disposal.   Patient will take Lonsurf 20mg  (trifluridine component) tablets, 3 tablets (60mg  trifluridine) by mouth twice daily, within 1 hour hour of finishing AM & PM meals, on days 1-5 and days 8-12, every 28 days.  Patient plans to take Lonsurf M-F, Saturday and Sunday off days, Lonsurf M-F again, then no tablets for the next 2 weeks. Patient will start D1 of each cycle on Mondays.  Lonsurf start date: 04/05/2018   Adverse effects include but are not limited to: fatigue, nausea, vomiting, diarrhea, and decreased blood counts.    Patient has anti-emetic on hand and knows to take it if nausea develops.   Patient will obtain anti diarrheal and alert the office of 4 or more loose stools above baseline.  Confirmed with patient OV for 04/12/18 (cycle 1 day 8). Patient updated about likely CBC check on Cycle 1 Day 14.   Reviewed with patient importance of keeping a medication schedule and plan for any missed doses.   Ms. Munnerlyn voiced understanding and appreciation.    All questions answered.  Medication reconciliation performed and medication/allergy list updated.  Patient will pick-up her Lonsurf today (04/02/2018) from the [facility] for copayment $0 Patient informed about their refill process.  [facility] is now in network with patient's prescription insurance coverage as of 03/29/2018. She had previously been restricted to [facility] for Xeloda dispensing.   Patient knows to call the office with questions or concerns. Oral Oncology Clinic will continue to follow.   Thank you,   Johny Drilling, PharmD, BCPS, BCOP  04/02/2018   9:45 AM Oral Oncology Clinic [phone]

## 2018-04-07 ENCOUNTER — Other Ambulatory Visit: Payer: Self-pay

## 2018-04-07 ENCOUNTER — Other Ambulatory Visit: Payer: Self-pay | Admitting: *Deleted

## 2018-04-07 ENCOUNTER — Encounter (HOSPITAL_BASED_OUTPATIENT_CLINIC_OR_DEPARTMENT_OTHER): Payer: Self-pay | Admitting: *Deleted

## 2018-04-07 MED ORDER — SORBITOL 70 % PO SOLN: ORAL | 0 refills | Status: AC

## 2018-04-07 NOTE — Progress Notes (Signed)
Spoke w/ pt via phone for pre-op interview.  Npo after mn w/ exception clear liquids until 0745 (no cream/ milk products).  Arrive at 1145.  Current lab results dated 03-22-2018 in chart and epic.  Will take methadone, prilosec, and k-dur am dos w/ sips of water and if needed take morphine.

## 2018-04-07 NOTE — Progress Notes (Signed)
Patient has not had a bm in 4 days again. She has tried the bottle of mag citrate, she had her home care nurse do an enema. She has had no results. Per Dr. Benay Spice new order was sent to [facility] for Sorbitol. See medication list. Patient verbalized an understanding of directions. She will start this afternoon. Message left for desk RN to check on her tomorrow.

## 2018-04-09 ENCOUNTER — Encounter (HOSPITAL_BASED_OUTPATIENT_CLINIC_OR_DEPARTMENT_OTHER): Payer: Self-pay

## 2018-04-09 ENCOUNTER — Ambulatory Visit (HOSPITAL_BASED_OUTPATIENT_CLINIC_OR_DEPARTMENT_OTHER): Payer: BLUE CROSS/BLUE SHIELD | Admitting: Anesthesiology

## 2018-04-09 ENCOUNTER — Encounter (HOSPITAL_BASED_OUTPATIENT_CLINIC_OR_DEPARTMENT_OTHER)
Admission: RE | Disposition: A | Discharge status: Home / Self Care | Payer: Self-pay | Source: Ambulatory Visit | Attending: Urology

## 2018-04-09 ENCOUNTER — Ambulatory Visit (HOSPITAL_BASED_OUTPATIENT_CLINIC_OR_DEPARTMENT_OTHER)
Admission: RE | Admit: 2018-04-09 | Discharge: 2018-04-09 | Disposition: A | Discharge status: Home / Self Care | Payer: BLUE CROSS/BLUE SHIELD | Source: Ambulatory Visit | Attending: Urology | Admitting: Urology

## 2018-04-09 DIAGNOSIS — C189 Malignant neoplasm of colon, unspecified: Secondary | ICD-10-CM | POA: Insufficient documentation

## 2018-04-09 DIAGNOSIS — K219 Gastro-esophageal reflux disease without esophagitis: Secondary | ICD-10-CM | POA: Insufficient documentation

## 2018-04-09 DIAGNOSIS — D473 Essential (hemorrhagic) thrombocythemia: Secondary | ICD-10-CM | POA: Insufficient documentation

## 2018-04-09 DIAGNOSIS — Z808 Family history of malignant neoplasm of other organs or systems: Secondary | ICD-10-CM | POA: Diagnosis not present

## 2018-04-09 DIAGNOSIS — G8929 Other chronic pain: Secondary | ICD-10-CM | POA: Diagnosis not present

## 2018-04-09 DIAGNOSIS — D509 Iron deficiency anemia, unspecified: Secondary | ICD-10-CM | POA: Diagnosis not present

## 2018-04-09 DIAGNOSIS — R600 Localized edema: Secondary | ICD-10-CM | POA: Diagnosis not present

## 2018-04-09 DIAGNOSIS — K449 Diaphragmatic hernia without obstruction or gangrene: Secondary | ICD-10-CM | POA: Diagnosis not present

## 2018-04-09 DIAGNOSIS — D649 Anemia, unspecified: Secondary | ICD-10-CM | POA: Insufficient documentation

## 2018-04-09 DIAGNOSIS — C787 Secondary malignant neoplasm of liver and intrahepatic bile duct: Secondary | ICD-10-CM | POA: Diagnosis not present

## 2018-04-09 DIAGNOSIS — C786 Secondary malignant neoplasm of retroperitoneum and peritoneum: Secondary | ICD-10-CM | POA: Insufficient documentation

## 2018-04-09 DIAGNOSIS — M419 Scoliosis, unspecified: Secondary | ICD-10-CM | POA: Insufficient documentation

## 2018-04-09 DIAGNOSIS — F329 Major depressive disorder, single episode, unspecified: Secondary | ICD-10-CM | POA: Diagnosis not present

## 2018-04-09 DIAGNOSIS — Z86711 Personal history of pulmonary embolism: Secondary | ICD-10-CM | POA: Diagnosis not present

## 2018-04-09 DIAGNOSIS — Z933 Colostomy status: Secondary | ICD-10-CM | POA: Diagnosis not present

## 2018-04-09 DIAGNOSIS — Z86718 Personal history of other venous thrombosis and embolism: Secondary | ICD-10-CM | POA: Insufficient documentation

## 2018-04-09 DIAGNOSIS — R000 Tachycardia, unspecified: Secondary | ICD-10-CM | POA: Insufficient documentation

## 2018-04-09 DIAGNOSIS — Z9221 Personal history of antineoplastic chemotherapy: Secondary | ICD-10-CM | POA: Insufficient documentation

## 2018-04-09 DIAGNOSIS — N131 Hydronephrosis with ureteral stricture, not elsewhere classified: Secondary | ICD-10-CM | POA: Diagnosis not present

## 2018-04-09 DIAGNOSIS — M545 Low back pain: Secondary | ICD-10-CM | POA: Diagnosis not present

## 2018-04-09 DIAGNOSIS — E876 Hypokalemia: Secondary | ICD-10-CM | POA: Diagnosis not present

## 2018-04-09 DIAGNOSIS — Z981 Arthrodesis status: Secondary | ICD-10-CM | POA: Insufficient documentation

## 2018-04-09 DIAGNOSIS — Z87891 Personal history of nicotine dependence: Secondary | ICD-10-CM | POA: Insufficient documentation

## 2018-04-09 DIAGNOSIS — Z8249 Family history of ischemic heart disease and other diseases of the circulatory system: Secondary | ICD-10-CM | POA: Insufficient documentation

## 2018-04-09 DIAGNOSIS — F419 Anxiety disorder, unspecified: Secondary | ICD-10-CM | POA: Diagnosis not present

## 2018-04-09 HISTORY — DX: Personal history of pulmonary embolism: Z86.711

## 2018-04-09 HISTORY — PX: CYSTOSCOPY W/ URETERAL STENT PLACEMENT: SHX1429

## 2018-04-09 HISTORY — DX: Personal history of other infectious and parasitic diseases: Z86.19

## 2018-04-09 HISTORY — DX: Other constipation: K59.09

## 2018-04-09 HISTORY — DX: Personal history of diseases of the skin and subcutaneous tissue: Z87.2

## 2018-04-09 HISTORY — DX: Personal history of other diseases of the digestive system: Z87.19

## 2018-04-09 HISTORY — DX: Lymphedema, not elsewhere classified: I89.0

## 2018-04-09 HISTORY — DX: Presence of other specified devices: Z97.8

## 2018-04-09 HISTORY — DX: Presence of urogenital implants: Z96.0

## 2018-04-09 HISTORY — DX: Unspecified hydronephrosis: N13.30

## 2018-04-09 HISTORY — DX: Personal history of other venous thrombosis and embolism: Z86.718

## 2018-04-09 HISTORY — DX: Duplication of ureter: Q62.5

## 2018-04-09 HISTORY — DX: Long term (current) use of anticoagulants: Z79.01

## 2018-04-09 HISTORY — DX: Retention of urine, unspecified: R33.9

## 2018-04-09 SURGERY — CYSTOSCOPY, FLEXIBLE, WITH STENT REPLACEMENT
Anesthesia: General | Site: Renal | Laterality: Bilateral

## 2018-04-09 MED ORDER — OXYCODONE HCL 5 MG PO TABS
10.0000 mg | ORAL_TABLET | Freq: Once | ORAL | Status: AC
  Administered 2018-04-09: 10 mg via ORAL
  Filled 2018-04-09: qty 2

## 2018-04-09 MED ORDER — ONDANSETRON HCL 4 MG/2ML IJ SOLN
INTRAMUSCULAR | Status: AC
  Filled 2018-04-09: qty 2

## 2018-04-09 MED ORDER — METHYLENE BLUE 0.5 % INJ SOLN
INTRAVENOUS | Status: DC | PRN
  Administered 2018-04-09: 4 mL via INTRAVENOUS

## 2018-04-09 MED ORDER — DEXAMETHASONE SODIUM PHOSPHATE 10 MG/ML IJ SOLN
INTRAMUSCULAR | Status: AC
  Filled 2018-04-09: qty 1

## 2018-04-09 MED ORDER — OXYBUTYNIN CHLORIDE 5 MG PO TABS: 5.0000 mg | ORAL_TABLET | Freq: Three times a day (TID) | ORAL | 1 refills | Status: AC | PRN

## 2018-04-09 MED ORDER — OXYBUTYNIN CHLORIDE 5 MG PO TABS
ORAL_TABLET | ORAL | Status: AC
  Filled 2018-04-09: qty 1

## 2018-04-09 MED ORDER — PROPOFOL 10 MG/ML IV BOLUS
INTRAVENOUS | Status: DC | PRN
  Administered 2018-04-09: 120 mg via INTRAVENOUS

## 2018-04-09 MED ORDER — PROPOFOL 10 MG/ML IV BOLUS
INTRAVENOUS | Status: AC
  Filled 2018-04-09: qty 20

## 2018-04-09 MED ORDER — PHENAZOPYRIDINE HCL 100 MG PO TABS
ORAL_TABLET | ORAL | Status: AC
  Filled 2018-04-09: qty 2

## 2018-04-09 MED ORDER — FENTANYL CITRATE (PF) 100 MCG/2ML IJ SOLN
INTRAMUSCULAR | Status: AC
  Filled 2018-04-09: qty 2

## 2018-04-09 MED ORDER — PHENAZOPYRIDINE HCL 200 MG PO TABS
200.0000 mg | ORAL_TABLET | Freq: Three times a day (TID) | ORAL | Status: DC
  Administered 2018-04-09: 200 mg via ORAL
  Filled 2018-04-09: qty 1

## 2018-04-09 MED ORDER — MIDAZOLAM HCL 2 MG/2ML IJ SOLN
INTRAMUSCULAR | Status: DC | PRN
  Administered 2018-04-09: 1 mg via INTRAVENOUS

## 2018-04-09 MED ORDER — LACTATED RINGERS IV SOLN
INTRAVENOUS | Status: DC
  Administered 2018-04-09: 13:00:00 via INTRAVENOUS
  Filled 2018-04-09: qty 1000

## 2018-04-09 MED ORDER — PROMETHAZINE HCL 25 MG/ML IJ SOLN
6.2500 mg | INTRAMUSCULAR | Status: DC | PRN
  Filled 2018-04-09: qty 1

## 2018-04-09 MED ORDER — OXYCODONE HCL 5 MG PO TABS
ORAL_TABLET | ORAL | Status: AC
  Filled 2018-04-09: qty 2

## 2018-04-09 MED ORDER — LIDOCAINE 2% (20 MG/ML) 5 ML SYRINGE
INTRAMUSCULAR | Status: DC | PRN
  Administered 2018-04-09: 50 mg via INTRAVENOUS

## 2018-04-09 MED ORDER — MIDAZOLAM HCL 2 MG/2ML IJ SOLN
INTRAMUSCULAR | Status: AC
  Filled 2018-04-09: qty 2

## 2018-04-09 MED ORDER — OXYBUTYNIN CHLORIDE 5 MG PO TABS
5.0000 mg | ORAL_TABLET | Freq: Three times a day (TID) | ORAL | Status: DC
  Administered 2018-04-09: 5 mg via ORAL
  Filled 2018-04-09: qty 1

## 2018-04-09 MED ORDER — LIDOCAINE 2% (20 MG/ML) 5 ML SYRINGE
INTRAMUSCULAR | Status: AC
  Filled 2018-04-09: qty 5

## 2018-04-09 MED ORDER — CIPROFLOXACIN IN D5W 400 MG/200ML IV SOLN
INTRAVENOUS | Status: AC
  Filled 2018-04-09: qty 200

## 2018-04-09 MED ORDER — CIPROFLOXACIN HCL 500 MG PO TABS: 500.0000 mg | ORAL_TABLET | Freq: Two times a day (BID) | ORAL | 0 refills | Status: AC

## 2018-04-09 MED ORDER — PHENAZOPYRIDINE HCL 200 MG PO TABS: 200.0000 mg | ORAL_TABLET | Freq: Three times a day (TID) | ORAL | 0 refills | Status: AC | PRN

## 2018-04-09 MED ORDER — FENTANYL CITRATE (PF) 100 MCG/2ML IJ SOLN
25.0000 ug | INTRAMUSCULAR | Status: DC | PRN
  Administered 2018-04-09: 25 ug via INTRAVENOUS
  Administered 2018-04-09: 50 ug via INTRAVENOUS
  Filled 2018-04-09: qty 1

## 2018-04-09 MED ORDER — ONDANSETRON HCL 4 MG/2ML IJ SOLN
INTRAMUSCULAR | Status: DC | PRN
  Administered 2018-04-09: 4 mg via INTRAVENOUS

## 2018-04-09 MED ORDER — DEXAMETHASONE SODIUM PHOSPHATE 10 MG/ML IJ SOLN
INTRAMUSCULAR | Status: DC | PRN
  Administered 2018-04-09: 4 mg via INTRAVENOUS

## 2018-04-09 MED ORDER — FENTANYL CITRATE (PF) 100 MCG/2ML IJ SOLN
INTRAMUSCULAR | Status: DC | PRN
  Administered 2018-04-09: 50 ug via INTRAVENOUS

## 2018-04-09 MED ORDER — BELLADONNA ALKALOIDS-OPIUM 16.2-60 MG RE SUPP
RECTAL | Status: AC
  Filled 2018-04-09: qty 1

## 2018-04-09 MED ORDER — CIPROFLOXACIN IN D5W 400 MG/200ML IV SOLN
400.0000 mg | Freq: Once | INTRAVENOUS | Status: AC
  Administered 2018-04-09: 400 mg via INTRAVENOUS
  Filled 2018-04-09: qty 200

## 2018-04-09 MED FILL — CIPROFLOXACIN HCL 500 MG TA: 500 | 3 days supply | Qty: 6 | Fill #0

## 2018-04-09 MED FILL — OXYBUTYNIN CHLORIDE 5 MG TA: 5 | 10 days supply | Qty: 30 | Fill #0

## 2018-04-09 SURGICAL SUPPLY — 34 items
APL SKNCLS STERI-STRIP NONHPOA (GAUZE/BANDAGES/DRESSINGS)
BAG DRAIN URO-CYSTO SKYTR STRL (DRAIN) ×3 IMPLANT
BAG DRN UROCATH (DRAIN) ×1
BASKET STONE 1.7 NGAGE (UROLOGICAL SUPPLIES) IMPLANT
BASKET ZERO TIP NITINOL 2.4FR (BASKET) ×3 IMPLANT
BENZOIN TINCTURE PRP APPL 2/3 (GAUZE/BANDAGES/DRESSINGS) IMPLANT
BSKT STON RTRVL ZERO TP 2.4FR (BASKET) ×1
CATH FOLEY 2WAY SLVR  5CC 16FR (CATHETERS)
CATH FOLEY 2WAY SLVR 5CC 16FR (CATHETERS) IMPLANT
CATH URET 5FR 28IN OPEN ENDED (CATHETERS) IMPLANT
CLOSURE WOUND 1/2 X4 (GAUZE/BANDAGES/DRESSINGS)
CLOTH BEACON ORANGE TIMEOUT ST (SAFETY) ×3 IMPLANT
FIBER LASER FLEXIVA 365 (UROLOGICAL SUPPLIES) IMPLANT
FIBER LASER TRAC TIP (UROLOGICAL SUPPLIES) IMPLANT
GLOVE BIO SURGEON STRL SZ7.5 (GLOVE) ×3 IMPLANT
GOWN STRL REUS W/TWL XL LVL3 (GOWN DISPOSABLE) ×5 IMPLANT
GUIDEWIRE ANG ZIPWIRE 038X150 (WIRE) ×2 IMPLANT
GUIDEWIRE STR DUAL SENSOR (WIRE) ×2 IMPLANT
GUIDEWIRE ZIPWRE .038 STRAIGHT (WIRE) ×5 IMPLANT
INFUSOR MANOMETER BAG 3000ML (MISCELLANEOUS) ×3 IMPLANT
IV NS 1000ML (IV SOLUTION)
IV NS 1000ML BAXH (IV SOLUTION) IMPLANT
IV NS IRRIG 3000ML ARTHROMATIC (IV SOLUTION) ×5 IMPLANT
KIT TURNOVER CYSTO (KITS) ×3 IMPLANT
MANIFOLD NEPTUNE II (INSTRUMENTS) ×3 IMPLANT
NS IRRIG 500ML POUR BTL (IV SOLUTION) ×6 IMPLANT
PACK CYSTO (CUSTOM PROCEDURE TRAY) ×3 IMPLANT
STENT CONTOUR NO GW 8FR 26CM (STENTS) ×2 IMPLANT
STENT URET 6FRX26 CONTOUR (STENTS) ×2 IMPLANT
STRIP CLOSURE SKIN 1/2X4 (GAUZE/BANDAGES/DRESSINGS) IMPLANT
SYR 10ML LL (SYRINGE) ×3 IMPLANT
TUBE CONNECTING 12'X1/4 (SUCTIONS)
TUBE CONNECTING 12X1/4 (SUCTIONS) IMPLANT
TUBING UROLOGY SET (TUBING) IMPLANT

## 2018-04-09 NOTE — OR Nursing (Signed)
PER MD, NO SCDs REQUIRED FOR PATIENT DURING SURGERY R/T BILATERAL CELLULITIS TO LOWER EXTREMITIES.

## 2018-04-09 NOTE — OR Nursing (Signed)
PATIENT'S FOLEY REMOVED AT BEGINNING OF CASE BY Rhodia Albright, RN.

## 2018-04-09 NOTE — Op Note (Signed)
Operative Note  Preoperative diagnosis:  1.  Metastatic colon cancer 2.  Bilateral ureteral obstruction secondary to extrinsic compression of the bladder by large pelvic mass  Postoperative diagnosis: 1.  Same  Procedure(s): 1.  Cystoscopy with bilateral stent exchange  Surgeon: Ellison Hughs, MD  Assistants: None  Anesthesia: General  Complications: None  EBL: Less than 5 mL  Specimens: 1.  Bilateral ureteral stents were removed intact, inspected and discarded  Drains/Catheters: 1.  Right 6 French by 26 cm JJ stent w/o tether 2.  Left 8 French by 26 cm JJ stent w/o tether 3.  16 French Foley catheter with 10 mL of the balloon  Intraoperative findings:   1.  The previously placed left sided Polaris stent migrated proximally up the ureter, but wasretrieved and removed intact 2.  Bilateral ureteral stents in good position following replacement 3.  Very low bladder capacity secondary to extrinsic compression from her pelvic mass.  I was unable to navigate the rigid scope into the lumen of the bladder due to the anterior orientation of her urethra.  A flexible scope with graspers was used to retrieve both stents  Indication:  Carolyn Barton is a 62 y.o. female with stage IV colon cancer and bilateral ureteral obstruction due to extrinsic compression of her bladder and ureters from a large pelvic mass.  She has been consented for the above procedures, voices understanding and wishes to proceed.  Description of procedure:  After informed consent was obtained, the patient was brought to the operating room and general LMA anesthesia was administered. The patient was then placed in the dorsolithotomy position and prepped and draped in usual sterile fashion. A timeout was performed. A 23 French rigid cystoscope was then inserted into the urethral meatus and advanced to the bladder neck, but due to anterior orientation of her urethra, the rigid cystoscope could not be navigated  into the actual lumen of the bladder.  The rigid scope was then replaced with a 16 French flexible cystoscope.  Her right JJ stent was identified grasped and extracted to the urethral meatus.  A wire was then advanced up the lumen of the stent and up to the right renal pelvis, under fluoroscopic guidance.  Her previously placed stent was removed intact, inspected and discarded.  A new 6 [place] by 26 cm JJ stent was then placed over the wire and into good position within the right collecting system, confirming placement via fluoroscopy.  On fluoroscopy, it appeared that her left-sided stent had migrated proximally up the left ureter making identification very difficult.  I was able to find a small strand of her Polaris stent extruding from left ureteral orifice and was able to retract the stent enough to be able to be grasped and removed.  A Glidewire was then advanced through the lumen of the stent and up to the left renal pelvis, under fluoroscopic guidance.  A new 8 [place] by 26 cm JJ stent was then advanced over the wire and into good position within the left collecting system, confirming placement via fluoroscopy.  A 16 French Foley catheter was then placed with return of clear irrigant.  She tolerated the procedure well and was transferred to the postanesthesia in stable condition.  Plan: Stent exchange in 3 months.  The patient has been instructed to removed her Foley at 6AM on 04/12/18

## 2018-04-09 NOTE — Discharge Instructions (Signed)
Post Anesthesia Home Care Instructions  Activity: Get plenty of rest for the remainder of the day. A responsible individual must stay with you for 24 hours following the procedure.  For the next 24 hours, DO NOT: -Drive a car -Paediatric nurse -Drink alcoholic beverages -Take any medication unless instructed by your physician -Make any legal decisions or sign important papers.  Meals: Start with liquid foods such as gelatin or soup. Progress to regular foods as tolerated. Avoid greasy, spicy, heavy foods. If nausea and/or vomiting occur, drink only clear liquids until the nausea and/or vomiting subsides. Call your physician if vomiting continues.  Special Instructions/Symptoms: Your throat may feel dry or sore from the anesthesia or the breathing tube placed in your throat during surgery. If this causes discomfort, gargle with warm salt water. The discomfort should disappear within 24 hours.  If you had a scopolamine patch placed behind your ear for the management of post- operative nausea and/or vomiting:  1. The medication in the patch is effective for 72 hours, after which it should be removed.  Wrap patch in a tissue and discard in the trash. Wash hands thoroughly with soap and water. 2. You may remove the patch earlier than 72 hours if you experience unpleasant side effects which may include dry mouth, dizziness or visual disturbances. 3. Avoid touching the patch. Wash your hands with soap and water after contact with the patch.    Post Anesthesia Home Care Instructions  Activity: Get plenty of rest for the remainder of the day. A responsible individual must stay with you for 24 hours following the procedure.  For the next 24 hours, DO NOT: -Drive a car -Paediatric nurse -Drink alcoholic beverages -Take any medication unless instructed by your physician -Make any legal decisions or sign important papers.  Meals: Start with liquid foods such as gelatin or soup. Progress to  regular foods as tolerated. Avoid greasy, spicy, heavy foods. If nausea and/or vomiting occur, drink only clear liquids until the nausea and/or vomiting subsides. Call your physician if vomiting continues.  Special Instructions/Symptoms: Your throat may feel dry or sore from the anesthesia or the breathing tube placed in your throat during surgery. If this causes discomfort, gargle with warm salt water. The discomfort should disappear within 24 hours.  If you had a scopolamine patch placed behind your ear for the management of post- operative nausea and/or vomiting:  1. The medication in the patch is effective for 72 hours, after which it should be removed.  Wrap patch in a tissue and discard in the trash. Wash hands thoroughly with soap and water. 2. You may remove the patch earlier than 72 hours if you experience unpleasant side effects which may include dry mouth, dizziness or visual disturbances. 3. Avoid touching the patch. Wash your hands with soap and water after contact with the patch.   [facility] [phone] Post Ureteroscopy With or Without Stent Instructions  Definitions:  Ureter: The duct that transports urine from the kidney to the bladder. Stent:   A plastic hollow tube that is placed into the ureter, from the kidney to the                 bladder to prevent the ureter from swelling shut.  GENERAL INSTRUCTIONS:  Despite the fact that no skin incisions were used, the area around the ureter and bladder is raw and irritated. The stent is a foreign body which will further irritate the bladder wall. This irritation is manifested by  increased frequency of urination, both day and night, and by an increase in the urge to urinate. In some, the urge to urinate is present almost always. Sometimes the urge is strong enough that you may not be able to stop yourself from urinating. The only real cure is to remove the stent and then give time for the bladder wall to heal  which can't be done until the danger of the ureter swelling shut has passed, which varies.  You may see some blood in your urine while the stent is in place and a few days afterwards. Do not be alarmed, even if the urine was clear for a while. Get off your feet and drink lots of fluids until clearing occurs. If you start to pass clots or don't improve, call us.  DIET: You may return to your normal diet immediately. Because of the raw surface of your bladder, alcohol, spicy foods, acid type foods and drinks with caffeine may cause irritation or frequency and should be used in moderation. To keep your urine flowing freely and to avoid constipation, drink plenty of fluids during the day ( 8-10 glasses ). Tip: Avoid cranberry juice because it is very acidic.  ACTIVITY: Your physical activity doesn't need to be restricted. However, if you are very active, you may see some blood in your urine. We suggest that you reduce your activity under these circumstances until the bleeding has stopped.  BOWELS: It is important to keep your bowels regular during the postoperative period. Straining with bowel movements can cause bleeding. A bowel movement every other day is reasonable. Use a mild laxative if needed, such as Milk of Magnesia 2-3 tablespoons, or 2 Dulcolax tablets. Call if you continue to have problems. If you have been taking narcotics for pain, before, during or after your surgery, you may be constipated. Take a laxative if necessary.   MEDICATION: You should resume your pre-surgery medications unless told not to. In addition you will often be given an antibiotic to prevent infection. These should be taken as prescribed until the bottles are finished unless you are having an unusual reaction to one of the drugs.  PROBLEMS YOU SHOULD REPORT TO [place]:  Fevers over 100.5 Fahrenheit.  Heavy bleeding, or clots ( See above notes about blood in urine ).  Inability to urinate.  Drug reactions ( hives,  rash, nausea, vomiting, diarrhea ).  Severe burning or pain with urination that is not improving.  FOLLOW-UP: You will need a follow-up appointment to monitor your progress. Call for this appointment at the number listed above. Usually the first appointment will be about three to fourteen days after your surgery.

## 2018-04-09 NOTE — Transfer of Care (Signed)
Immediate Anesthesia Transfer of Care Note  Patient: Carolyn Barton  Procedure(s) Performed: Procedure(s) (LRB): CYSTOSCOPY WITH STENT EXCHANGE (Bilateral)  Patient Location: PACU  Anesthesia Type: General  Level of Consciousness: awake, oriented, sedated and patient cooperative  Airway & Oxygen Therapy: Patient Spontanous Breathing and Patient connected to face mask oxygen  Post-op Assessment: Report given to PACU RN and Post -op Vital signs reviewed and stable  Post vital signs: Reviewed and stable  Complications: No apparent anesthesia complications Last Vitals:  Vitals Value Taken Time  BP 112/77 04/09/2018  2:56 PM  Temp    Pulse 95 04/09/2018  2:59 PM  Resp 13 04/09/2018  2:59 PM  SpO2 100 % 04/09/2018  2:59 PM  Vitals shown include unvalidated device data.  Last Pain:  Vitals:   04/09/18 1213  TempSrc:   PainSc: 6       Patients Stated Pain Goal: 5 (04/09/18 1213)

## 2018-04-09 NOTE — Anesthesia Preprocedure Evaluation (Signed)
Anesthesia Evaluation  Patient identified by MRN, date of birth, ID band Patient awake    Reviewed: Allergy & Precautions, H&P , NPO status , Patient's Chart, lab work & pertinent test results, reviewed documented beta blocker date and time   History of Anesthesia Complications (+) PONV and history of anesthetic complications  Airway Mallampati: II  TM Distance: >3 FB Neck ROM: full    Dental no notable dental hx. (+) Teeth Intact, Dental Advisory Given   Pulmonary former smoker,    Pulmonary exam normal breath sounds clear to auscultation       Cardiovascular Exercise Tolerance: Good + DVT   Rhythm:regular Rate:Normal  05/2016 Echo: Impressions:  - Normal LV systolic function; grade 1 diastolic dysfunction;   global longitudinal strain -17.1%.   Neuro/Psych  Neuromuscular disease negative psych ROS   GI/Hepatic Neg liver ROS, hiatal hernia, Liver metastasis  Metastatic colon ca   Endo/Other  negative endocrine ROS  Renal/GU negative Renal ROS     Musculoskeletal Hx of left leg swelling: evaluated by oncologist recently.   Abdominal   Peds  Hematology  (+) anemia ,   Anesthesia Other Findings   Reproductive/Obstetrics                             Anesthesia Physical  Anesthesia Plan  ASA: III  Anesthesia Plan: General   Post-op Pain Management:    Induction: Intravenous  PONV Risk Score and Plan: 4 or greater and Ondansetron, Dexamethasone, Treatment may vary due to age or medical condition, Diphenhydramine and Midazolam  Airway Management Planned: LMA  Additional Equipment: None  Intra-op Plan:   Post-operative Plan: Extubation in OR  Informed Consent: I have reviewed the patients History and Physical, chart, labs and discussed the procedure including the risks, benefits and alternatives for the proposed anesthesia with the patient or authorized representative who has  indicated his/her understanding and acceptance.   Dental advisory given  Plan Discussed with: CRNA and Surgeon  Anesthesia Plan Comments:         Anesthesia Quick Evaluation

## 2018-04-09 NOTE — H&P (Signed)
Urology Preoperative H&P   Chief Complaint: Ureteral obstruction  History of Present Illness: Carolyn Barton is a 62 y.o. female with a history of metastatic colon cancer causing extrinsic compression of both ureters. She is status post cystoscopy with bilateral JJ stent placement in January 2019. Postoperatively, she had issues with urinary retention that required an indwelling Foley catheter along with leakage around the catheter. She has recently started on oxybutynin 5 mg twice a day with little improvement in her leakage around the catheter. She was noted to have a very small bladder capacity 2/2 extrinsic compression from her pelvic mass.        Past Medical History:  Diagnosis Date  . Abdominal wall cellulitis 03/10/2016  . Anxiety   . Ascites 10/27/2017   Small volume noted on CT  . Aspiration of liquid 12/23/2017   pelvic cyst/mass  . Bowel obstruction ([place])   . Chronic back pain    lumbar    . Colostomy in place [facility])   . Depression   . DVT (deep venous thrombosis) ([place]) 10/27/2017   Non occlusive left common femoral vein  . Dyspnea   . 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 GERD (gastroesophageal reflux disease)   . Grade I diastolic dysfunction 93/23/5573   ECHO   . History of chemotherapy last chemo 09-21-17  . History of hiatal hernia   . History of pelvic mass 11/2017   cyst  . History of pulmonary embolism 09/2017   Bilateral  . Hydronephrosis    right  . Hypokalemia    takes potassium  . Iron deficiency anemia 04/14/2016   treated w/ Iron infusions  . Liver metastasis ([place])    secondary to colorectal cancer  . Lower leg edema   . Metastatic colorectal cancer [facility]) dx 03/03/2016--- oncologist-- dr Benay Spice   metastatic colorectal carcinoma--  s/p  exp. lap. for bowel obstruction--  rectal mass, peritoneal carcinomatosis, liver mets---  chemotherapy  . Scoliosis   . Skin rash    on legs seeing wound  care specialist  . Spinal headache 12/21/2012  . Tachycardia    persistant since discharged from hospital 06/ 2017 due to anemia and deconditioning;  as of 09-02-2016 per pt no issues w/ heart racing in the past few weeks  . Ureteral obstruction, left   . Wears contact lenses          Past Surgical History:  Procedure Laterality Date  . BIOPSY N/A 03/03/2016   Procedure: BIOPSY OF PERITONEAL NODULE;  Surgeon: Erroll Luna, MD;  Location: [place];  Service: General;  Laterality: N/A;  . COLOSTOMY N/A 03/03/2016   Procedure: DIVERTING SIGMOID COLOSTOMY;  Surgeon: Erroll Luna, MD;  Location: [place];  Service: General;  Laterality: N/A;  . CYSTOSCOPY W/ RETROGRADES Right 10/13/2017   Procedure: CYSTOSCOPY WITH RETROGRADE PYELOGRAM/ STENT PLACEMENT;  Surgeon: Ceasar Mons, MD;  Location: [facility];  Service: Urology;  Laterality: Right;  ONLY NEEDS 30 MIN FOR BOTH PROCEDURES  . CYSTOSCOPY W/ URETERAL STENT PLACEMENT Left 10/09/2016   Procedure: CYSTOSCOPY WITH LEFT STENT REPLACEMENT 77F POLARIS STENT 24CM;  Surgeon: Carolan Clines, MD;  Location: [facility];  Service: Urology;  Laterality: Left;  . CYSTOSCOPY W/ URETERAL STENT PLACEMENT Left 03/16/2017   Procedure: CYSTOSCOPY WITH RETROGRADE PYELOGRAM WITH LEFT  STENT REMOVAL AND REPLACEMENT;  Surgeon: Carolan Clines, MD;  Location: [facility];  Service: Urology;  Laterality: Left;  . CYSTOSCOPY W/ URETERAL  STENT PLACEMENT Left 10/13/2017   Procedure: CYSTOSCOPY WITH STENT REPLACEMENT;  Surgeon: Ceasar Mons, MD;  Location: [facility];  Service: Urology;  Laterality: Left;  . CYSTOSCOPY WITH FULGERATION N/A 10/13/2017   Procedure: CYSTOSCOPY WITH FULGERATION;  Surgeon: Ceasar Mons, MD;  Location: [facility];  Service: Urology;  Laterality: N/A;  . CYSTOSCOPY WITH RETROGRADE PYELOGRAM, URETEROSCOPY AND STENT  PLACEMENT Left 03/18/2016   Procedure: CYSTOSCOPY WITH LEFT  RETROGRADE PYELOGRAM,  AND  POLARIS STENT PLACEMENT;  Surgeon: Carolan Clines, MD;  Location: [facility];  Service: Urology;  Laterality: Left;  . LAPAROTOMY N/A 03/03/2016   Procedure: EXPLORATORY LAPAROTOMY;  Surgeon: Erroll Luna, MD;  Location: [place];  Service: General;  Laterality: N/A;  . LUMBAR LAMINECTOMY  11/11/2012   L4-5  and Resection synovial cyst  . MAXIMUM ACCESS (MAS)POSTERIOR LUMBAR INTERBODY FUSION (PLIF) 1 LEVEL N/A 01/11/2014   Procedure: FOR MAXIMUM ACCESS (MAS) POSTERIOR LUMBAR INTERBODY FUSION Lumbar Five Sacral One;  Surgeon: [name], MD;  Location: [place] [facility];  Service: Neurosurgery;  Laterality: N/A;  FOR MAXIMUM ACCESS (MAS) POSTERIOR LUMBAR INTERBODY FUSION Lumbar Five Sacral One  . PORTACATH PLACEMENT  04/10/2016  . REPAIR CEREBROSPINAL FLUID LEAK POST RESECTION SYNOVIAL CYST  12/21/2012  . REVISION LUMBAR HARDWARE  01/26/2014  . TRANSTHORACIC ECHOCARDIOGRAM  06/17/2016   grade 1 diastolic function , ef 77-41%/  trivial TR  . WISDOM TOOTH EXTRACTION      Allergies: No Known Allergies       Family History  Problem Relation Age of Onset  . Heart disease Mother   . Melanoma Mother   . Colon cancer Neg Hx   . Colon polyps Neg Hx   . Rectal cancer Neg Hx   . Stomach cancer Neg Hx   . Esophageal cancer Neg Hx     Social History:  reports that she quit smoking about 39 years ago. Her smoking use included cigarettes. She has a 0.25 pack-year smoking history. She has never used smokeless tobacco. She reports that she does not drink alcohol or use drugs.  ROS: A complete review of systems was performed.  All systems are negative except for pertinent findings as noted.  Physical Exam:  Vital signs in last 24 hours: Constitutional:  Alert and oriented, No acute distress Cardiovascular: Regular rate and rhythm, No JVD Respiratory: Normal respiratory effort, Lungs clear  bilaterally GI: Abdomen is soft, nontender, nondistended, no abdominal masses GU: No CVA tenderness Lymphatic: No lymphadenopathy Neurologic: Grossly intact, no focal deficits Psychiatric: Normal mood and affect  Laboratory Data:  RecentLabs(last2labs)  No results for input(s): WBC, HGB, HCT, PLT in the last 72 hours.     RecentLabs(last2labs)  No results for input(s): NA, K, CL, GLUCOSE, BUN, CALCIUM, CREATININE in the last 72 hours.  Invalid input(s): CO3     LabResultsLast24Hours  No results found for this or any previous visit (from the past 24 hour(s)).   Recent Results (from the past 240 hour(s))  Urine culture     Status: Abnormal   Collection Time: 01/04/18 10:43 AM  Result Value Ref Range Status   Specimen Description   Final    URINE, CLEAN CATCH Performed at [facility], [address], [place], [place] [address]    Special Requests   Final    NONE Performed at [facility], [place] [address] [place], [place] [address]    Culture (A)  Final    60,000  COLONIES/mL ESCHERICHIA COLI >=100,000 COLONIES/mL ESCHERICHIA COLI Susceptibility patterns are not the same. Performed at [facility], [place] [address] [place], [place] [address]    Report Status 01/08/2018 FINAL  Final   Organism ID, Bacteria ESCHERICHIA COLI (A)  Final   Organism ID, Bacteria ESCHERICHIA COLI (A)  Final      Susceptibility   Escherichia coli - MIC*    AMPICILLIN >=32 RESISTANT Resistant     CEFAZOLIN <=4 SENSITIVE Sensitive     CEFTRIAXONE <=1 SENSITIVE Sensitive     CIPROFLOXACIN >=4 RESISTANT Resistant     GENTAMICIN <=1 SENSITIVE Sensitive     IMIPENEM <=0.25 SENSITIVE Sensitive     NITROFURANTOIN <=16 SENSITIVE Sensitive     TRIMETH/SULFA >=320 RESISTANT Resistant     AMPICILLIN/SULBACTAM 4 SENSITIVE Sensitive     PIP/TAZO <=4 SENSITIVE Sensitive      Extended ESBL NEGATIVE Sensitive    Escherichia coli - MIC*    AMPICILLIN >=32 RESISTANT Resistant     CEFAZOLIN <=4 SENSITIVE Sensitive     CEFTRIAXONE <=1 SENSITIVE Sensitive     CIPROFLOXACIN >=4 RESISTANT Resistant     GENTAMICIN <=1 SENSITIVE Sensitive     IMIPENEM 0.5 SENSITIVE Sensitive     NITROFURANTOIN 32 SENSITIVE Sensitive     TRIMETH/SULFA >=320 RESISTANT Resistant     AMPICILLIN/SULBACTAM >=32 RESISTANT Resistant     PIP/TAZO 8 SENSITIVE Sensitive     Extended ESBL NEGATIVE Sensitive     * 60,000 COLONIES/mL ESCHERICHIA COLI    >=100,000 COLONIES/mL ESCHERICHIA COLI    Renal Function: No results for input(s): CREATININE in the last 168 hours. Estimated Creatinine Clearance: 66.5 mL/min (A) (by C-G formula based on SCr of 0.58 mg/dL (L)).  Radiologic Imaging: ImagingResults(Last48hours)  No results found.    I independently reviewed the above imaging studies.  Assessment and Plan Carolyn Barton is a 62 y.o. female with a history of stage IV colon cancer causing bilateral ureteral obstruction  -The risks, benefits and alternatives of cystoscopy with bilateral stent exchange was discussed with the patient.  Risks include, but are not limited to, bleeding, infection, stent related pain, stent migration, ureteral injury, ureteral stricture formation and the inherent risks with general anesthesia.  She voices understanding and wishes to proceed.   Ellison Hughs, MD 04/09/18, 1:07 PM [facility] Pager: [phone]

## 2018-04-09 NOTE — Anesthesia Procedure Notes (Signed)
Procedure Name: LMA Insertion Date/Time: 04/09/2018 1:34 PM Performed by: Suan Halter, CRNA Pre-anesthesia Checklist: Patient identified, Emergency Drugs available, Suction available and Patient being monitored Patient Re-evaluated:Patient Re-evaluated prior to induction Oxygen Delivery Method: Circle system utilized Preoxygenation: Pre-oxygenation with 100% oxygen Induction Type: IV induction Ventilation: Mask ventilation without difficulty LMA: LMA inserted LMA Size: 4.0 Number of attempts: 1 Airway Equipment and Method: Bite block Placement Confirmation: positive ETCO2 Tube secured with: Tape Dental Injury: Teeth and Oropharynx as per pre-operative assessment

## 2018-04-11 NOTE — Anesthesia Postprocedure Evaluation (Signed)
Anesthesia Post Note  Patient: Carolyn Barton  Procedure(s) Performed: CYSTOSCOPY WITH STENT EXCHANGE (Bilateral Renal)     Patient location during evaluation: PACU Anesthesia Type: General Level of consciousness: awake Pain management: pain level controlled Vital Signs Assessment: post-procedure vital signs reviewed and stable Respiratory status: spontaneous breathing Cardiovascular status: stable Postop Assessment: no apparent nausea or vomiting Anesthetic complications: no    Last Vitals:  Vitals:   04/09/18 1615 04/09/18 1700  BP: (!) 154/102   Pulse: 98   Resp: (!) 26   Temp:  (!) 36.3 C  SpO2: 99%     Last Pain:  Vitals:   04/09/18 1630  TempSrc:   PainSc: 5    Pain Goal: Patients Stated Pain Goal: 5 (04/09/18 1213)               [place]

## 2018-04-12 ENCOUNTER — Encounter (HOSPITAL_BASED_OUTPATIENT_CLINIC_OR_DEPARTMENT_OTHER): Payer: Self-pay | Admitting: Urology

## 2018-04-12 ENCOUNTER — Ambulatory Visit: Payer: BLUE CROSS/BLUE SHIELD

## 2018-04-12 ENCOUNTER — Inpatient Hospital Stay: Payer: BLUE CROSS/BLUE SHIELD

## 2018-04-12 ENCOUNTER — Other Ambulatory Visit: Payer: Self-pay

## 2018-04-12 ENCOUNTER — Inpatient Hospital Stay (HOSPITAL_BASED_OUTPATIENT_CLINIC_OR_DEPARTMENT_OTHER): Payer: BLUE CROSS/BLUE SHIELD | Admitting: Oncology

## 2018-04-12 VITALS — BP 137/94 | HR 81 | Temp 98.5°F | Resp 18

## 2018-04-12 DIAGNOSIS — Z933 Colostomy status: Secondary | ICD-10-CM

## 2018-04-12 DIAGNOSIS — C787 Secondary malignant neoplasm of liver and intrahepatic bile duct: Secondary | ICD-10-CM | POA: Diagnosis not present

## 2018-04-12 DIAGNOSIS — K5900 Constipation, unspecified: Secondary | ICD-10-CM

## 2018-04-12 DIAGNOSIS — C189 Malignant neoplasm of colon, unspecified: Secondary | ICD-10-CM | POA: Diagnosis not present

## 2018-04-12 DIAGNOSIS — C19 Malignant neoplasm of rectosigmoid junction: Secondary | ICD-10-CM

## 2018-04-12 DIAGNOSIS — C786 Secondary malignant neoplasm of retroperitoneum and peritoneum: Secondary | ICD-10-CM | POA: Diagnosis not present

## 2018-04-12 DIAGNOSIS — C20 Malignant neoplasm of rectum: Secondary | ICD-10-CM

## 2018-04-12 DIAGNOSIS — D649 Anemia, unspecified: Secondary | ICD-10-CM

## 2018-04-12 DIAGNOSIS — R0600 Dyspnea, unspecified: Secondary | ICD-10-CM | POA: Diagnosis not present

## 2018-04-12 DIAGNOSIS — G893 Neoplasm related pain (acute) (chronic): Secondary | ICD-10-CM

## 2018-04-12 DIAGNOSIS — D509 Iron deficiency anemia, unspecified: Secondary | ICD-10-CM

## 2018-04-12 DIAGNOSIS — Z95828 Presence of other vascular implants and grafts: Secondary | ICD-10-CM

## 2018-04-12 DIAGNOSIS — F5089 Other specified eating disorder: Secondary | ICD-10-CM

## 2018-04-12 LAB — CBC WITH DIFFERENTIAL (CANCER CENTER ONLY)
BASOS PCT: 1 %
Basophils Absolute: 0 10*3/uL (ref 0.0–0.1)
Eosinophils Absolute: 0.1 10*3/uL (ref 0.0–0.5)
Eosinophils Relative: 1 %
HEMATOCRIT: 25.8 % — AB (ref 34.8–46.6)
HEMOGLOBIN: 7.8 g/dL — AB (ref 11.6–15.9)
LYMPHS PCT: 9 %
Lymphs Abs: 0.5 10*3/uL — ABNORMAL LOW (ref 0.9–3.3)
MCH: 22 pg — ABNORMAL LOW (ref 25.1–34.0)
MCHC: 30.2 g/dL — AB (ref 31.5–36.0)
MCV: 72.7 fL — AB (ref 79.5–101.0)
MONO ABS: 0.2 10*3/uL (ref 0.1–0.9)
MONOS PCT: 4 %
NEUTROS ABS: 4.9 10*3/uL (ref 1.5–6.5)
NEUTROS PCT: 85 %
Platelet Count: 419 10*3/uL — ABNORMAL HIGH (ref 145–400)
RBC: 3.55 MIL/uL — ABNORMAL LOW (ref 3.70–5.45)
RDW: 21.9 % — AB (ref 11.2–14.5)
WBC Count: 5.8 10*3/uL (ref 3.9–10.3)

## 2018-04-12 LAB — CMP (CANCER CENTER ONLY)
ALBUMIN: 2 g/dL — AB (ref 3.5–5.0)
ALT: 8 U/L (ref 0–44)
ANION GAP: 7 (ref 5–15)
AST: 9 U/L — ABNORMAL LOW (ref 15–41)
Alkaline Phosphatase: 64 U/L (ref 38–126)
BUN: 11 mg/dL (ref 8–23)
CALCIUM: 8.9 mg/dL (ref 8.9–10.3)
CO2: 28 mmol/L (ref 22–32)
Chloride: 104 mmol/L (ref 98–111)
Creatinine: 0.63 mg/dL (ref 0.44–1.00)
GFR, Estimated: >60 mL/min (ref >60)
GLUCOSE: 111 mg/dL — AB (ref 70–99)
POTASSIUM: 3.8 mmol/L (ref 3.5–5.1)
SODIUM: 139 mmol/L (ref 135–145)
Total Bilirubin: 0.2 mg/dL — ABNORMAL LOW (ref 0.3–1.2)
Total Protein: 6.4 g/dL — ABNORMAL LOW (ref 6.5–8.1)

## 2018-04-12 LAB — SAMPLE TO BLOOD BANK

## 2018-04-12 LAB — PREPARE RBC (CROSSMATCH)

## 2018-04-12 MED ORDER — HEPARIN SOD (PORK) LOCK FLUSH 100 UNIT/ML IV SOLN
500.0000 [IU] | Freq: Every day | INTRAVENOUS | Status: AC | PRN
  Administered 2018-04-12: 500 [IU]
  Filled 2018-04-12: qty 5

## 2018-04-12 MED ORDER — SODIUM CHLORIDE 0.9% FLUSH
10.0000 mL | INTRAVENOUS | Status: AC | PRN
  Administered 2018-04-12: 10 mL
  Filled 2018-04-12: qty 10

## 2018-04-12 MED ORDER — SODIUM CHLORIDE 0.9 % IJ SOLN
10.0000 mL | INTRAMUSCULAR | Status: DC | PRN
  Administered 2018-04-12: 10 mL via INTRAVENOUS
  Filled 2018-04-12: qty 10

## 2018-04-12 MED ORDER — SODIUM CHLORIDE 0.9 % IV SOLN
250.0000 mL | Freq: Once | INTRAVENOUS | Status: AC
  Administered 2018-04-12: 250 mL via INTRAVENOUS

## 2018-04-12 NOTE — Patient Instructions (Signed)

## 2018-04-12 NOTE — Progress Notes (Signed)
[facility] OFFICE PROGRESS NOTE   Diagnosis: Colon cancer  INTERVAL HISTORY:   Carolyn Barton returns as scheduled.  She is here today with her husband and brother.  Exchange of bilateral ureter stents on 04/09/2018. She complains of constipation.  She has limited output from the ostomy despite multiple laxatives including sorbitol.  A Foley catheter remains in place.  She reports stable pain.  She has a poor appetite. She started Lonsurf on 04/05/2018.  No nausea.  She continues home wound care.  She reports intermittent dyspnea.  No cough. Objective:  Vital signs in last 24 hours:  Blood pressure (!) 137/94, pulse 81, temperature 98.5 F (36.9 C), temperature source Oral, resp. rate 18, SpO2 100 %.    HEENT: No thrush or ulcers Resp: Lungs clear bilaterally Cardio: The rate and rhythm GI: The abdomen is distended, left lower quadrant colostomy with a small amount of formed stool Vascular: Trace edema at the right lower leg, 1+ edema throughout the left leg  Skin: Superficial ulcerations at the left pretibial region  Portacath/PICC-without erythema  Lab Results:  Lab Results  Component Value Date   WBC 5.8 04/12/2018   HGB 7.8 (L) 04/12/2018   HCT 25.8 (L) 04/12/2018   MCV 72.7 (L) 04/12/2018   PLT 419 (H) 04/12/2018   NEUTROABS 4.9 04/12/2018    CMP  Lab Results  Component Value Date   NA 132 (L) 03/22/2018   K 4.1 03/22/2018   CL 101 03/22/2018   CO2 23 03/22/2018   GLUCOSE 124 03/22/2018   BUN 13 03/22/2018   CREATININE 0.74 03/22/2018   CALCIUM 8.6 03/22/2018   PROT 6.0 (L) 03/22/2018   ALBUMIN 2.4 (L) 03/22/2018   AST 16 03/22/2018   ALT <6 03/22/2018   ALKPHOS 81 03/22/2018   BILITOT <0.2 (L) 03/22/2018   GFRNONAA >60 03/22/2018   GFRAA >60 03/22/2018    Lab Results  Component Value Date   CEA1 2.29 01/04/2018   Medications: I have reviewed the patient's current medications.   Assessment/Plan: 1.Metastatic colorectal cancer-status  post an exploratory laparotomy 03/03/2016 revealing a proximal rectal mass, peritoneal carcinomatosis, and liver metastases  Biopsy of peritoneal nodules 03/03/2016 confirmed metastatic adenocarcinoma consistent with a colon primary  Foundation 1 testing-MSI-stable, tumor mutation burden-low, no BRAF NRAS or KRAS mutation  Cycle 1 FOLFIRI/PANITUMUMAB 04/14/2016  Cycle 2 FOLFIRI/panitumumab 04/28/2016  Cycle 3 FOLFIRI/panitumumab 05/12/2016  Cycle 4 FOLFIRI/panitumumab 05/27/2016  Cycle 5 FOLFIRI/panitumumab 06/09/2016  Restaging CT abdomen/pelvis 06/20/2016-no evidence of disease progression, decreased left hepatic lesion  Cycle 6 FOLFIRI/panitumumab 06/23/2016  Cycle 7 FOLFIRI/panitumumab 07/07/2016  Cycle 8 FOLFIRI/panitumumab 07/21/2016  Cycle 9 FOLFIRI/PANITUMUMAB 08/04/2016  Cycle 10 FOLFIRI/panitumumab 08/18/2016  Restaging CT 08/29/2016 with interval decrease in the size of the medial segment left liver lesion. Lesion identified previously in the rectosigmoid colon not evident on the current study.  5-FU/PANITUMUMAB 09/01/2016  Xeloda 7 days on/7 days off and PANITUMUMAB every 3 weeks 09/15/2016 (awaiting insurance approval for Xeloda 09/15/2016)  CT abdomen/pelvis 12/25/2016-unchanged 5 mm right hepatic lesion, resolution of medial segment left liver lesion, no new liver lesion. Residual soft tissue fullness in the sigmoid , persistent distal esophageal wall thickening  Continuationof PANITUMUMAB every 3 weeks and Xeloda 7 days on/7 days off  CT 06/19/2017-new cystic/solid lesion in the left adnexa  Xeloda/panitumumab continued  CT 10/08/2016-progression of cystic pelvic mass  Cycle 1 FOLFIRI/Panitumumab 10/21/2017  Cystic pelvic mass aspirated 10/30/2017  Cycle 2 FOLFIRI/panitumumab 11/09/2017  Cycle 3 FOLFIRI/panitumumab 11/23/2017  Cycle  4 FOLFIRI/Panitumumab 12/07/2017  Cycle 5 FOLFIRI/panitumumab 12/21/2017  CT pelvis 12/23/2017- complex cystic pelvic mass  with mass-effect on the pelvic vasculature  CT aspiration of cystic pelvic mass 12/23/2017  Cycle 1 FOLFOX/Avastin 01/04/2018  Cycle 2 FOLFOX/Avastin 01/18/2018  Cycle 3 FOLFOX/Avastin 02/08/2018  Cycle 4 FOLFOX/Avastin 03/01/2018  CT abdomen/pelvis 03/26/2018-interval increase in size of solid and cystic mass within the pelvis.  Cycle 1 Lonsurf 04/05/2018  2.History of aBowel obstruction secondary to #1  3. Chronic back pain-maintained on methadone  4. Essential thrombocytosis  5. Early obstruction of the left ureter noted at the time of surgery 03/03/2016-Dr. Tannenbaum placed a left double-J stent 03/18/2016  New onset right hydronephrosis 10/11/2017  Right ureter stent placement 10/13/2017  6. Abdominal wall cellulitis 03/10/2016, blood cultures positive for coagulase negative staphylococcus-methicillin-resistant, status post treatment with vancomycin and Bactrim   8. Port-A-Cath placement 04/10/2016, interventional radiology  9. Iron deficiency anemia. Feraheme 11/05/2015 , 11/12/2015,and 04/14/2016-persistent anemia and red cell microcytosis, oral iron resumed 11/17/2016-stable  10. Pain/tenderness left posterior iliac region-resolved  11. Hypokalemia-started on potassium replacement 06/09/2016  12. History of skin rashand paronychiasecondary to [facility], she continues minocycline, moisturizers, and fluticasone as needed  13.Acute onset right low back pain 10/11/2017-likely secondary to obstruction of the right kidney;left ureter stent exchange and right ureter stent placement 10/13/2017  14.Left leg pain and edema-bilateral lower extremity venous Doppler negative for DVT1/15/2019 and 10/19/2017. Left common femoral DVT noted on CT venogram 10/27/2017;CT aspiration of pelvic cystic mass 10/30/2017 with subsequent marked improvement of leg edema.  15. Admission 10/22/2017 with bilateral pulmonary embolism, heparin drip initiated, converted  to Lovenox 10/23/2017, converted to Xarelto during the 10/27/2017 hospital admission  16. Admission 10/27/2017 with increased back/leg pain and a fever-blood and urine cultures positive fora resistant E. coli   [name] has metastatic colon cancer.  She is completing a second week of cycle 1 Lonsurf.  She has multiple symptoms today including pain, constipation, anorexia, and intermittent dyspnea.  I have a low clinical suspicion for a pulmonary embolism or pneumonia.  The dyspnea is most likely related to deconditioning, anemia, and restriction from the distended abdomen. I discussed treatment options with Carolyn Barton and her family.  We discussed continuing Lonsurf versus referring her for hospice care.  She would like to continue the trial of Lonsurf.  She agrees to a home palliative care referral.  She will take twice daily MiraLAX and twice daily sorbitol for constipation.  She will be transfused 1 unit of packed red blood cells today.  She will discontinue iron.  Carolyn Barton will return for an office visit in 2 weeks.  I am available to see her in the interim as needed.  30 minutes were spent with the patient today.  The majority of the time was used for counseling and coordination of care.  [name] Coder, MD  04/12/2018  9:14 AM

## 2018-04-13 LAB — BPAM RBC
BLOOD PRODUCT EXPIRATION DATE: 201908162359
ISSUE DATE / TIME: 201907151143
Unit Type and Rh: 600

## 2018-04-13 LAB — TYPE AND SCREEN
ABO/RH(D): A NEG
Antibody Screen: NEGATIVE
UNIT DIVISION: 0

## 2018-04-14 ENCOUNTER — Telehealth: Payer: Self-pay

## 2018-04-14 NOTE — Telephone Encounter (Signed)
VM left with patient to offer to schedule visit with Palliative Care.

## 2018-04-14 NOTE — Telephone Encounter (Signed)
Oral Oncology Patient Advocate Encounter  I verified with Sharon that the [facility] was picked up by patient on 04-02-18.  [facility] Patient [place] Phone [phone] Fax [phone]

## 2018-04-15 ENCOUNTER — Telehealth: Payer: Self-pay

## 2018-04-15 NOTE — Telephone Encounter (Signed)
Received return message from patient to schedule with Palliative Care. Returned call to patient. Scheduled visit with NP on Monday 04/19/18.

## 2018-04-19 ENCOUNTER — Encounter: Payer: Self-pay | Admitting: Internal Medicine

## 2018-04-19 ENCOUNTER — Other Ambulatory Visit: Payer: BLUE CROSS/BLUE SHIELD | Admitting: Internal Medicine

## 2018-04-19 DIAGNOSIS — T402X5A Adverse effect of other opioids, initial encounter: Secondary | ICD-10-CM

## 2018-04-19 DIAGNOSIS — R531 Weakness: Secondary | ICD-10-CM

## 2018-04-19 DIAGNOSIS — Z515 Encounter for palliative care: Secondary | ICD-10-CM

## 2018-04-19 DIAGNOSIS — Z7189 Other specified counseling: Secondary | ICD-10-CM

## 2018-04-19 DIAGNOSIS — K5903 Drug induced constipation: Secondary | ICD-10-CM

## 2018-04-19 DIAGNOSIS — G893 Neoplasm related pain (acute) (chronic): Secondary | ICD-10-CM

## 2018-04-19 DIAGNOSIS — E440 Moderate protein-calorie malnutrition: Secondary | ICD-10-CM

## 2018-04-19 NOTE — Progress Notes (Signed)
PALLIATIVE CARE CONSULT VISIT   PATIENT NAME: Carolyn Barton DOB: 13-Apr-1956 MRN: [id number]  PRIMARY CARE PROVIDER:   Marton Redwood, MD, Dr. Benay Spice (oncology)  REFERRING PROVIDER:  Marton Redwood, MD [address] [place], [place] [address]  Family: [facility]) husband Lucina Betty [phone]; [name] [phone]  RECOMMENDATIONS and PLAN:  1. Advanced Care Planning: DNR forms completed this visit; left 2 copies. Recommended one onto fridge (not their preference) or in a known, easily accessible location. Carry other copy with her when travel outside the home. Encouraged family conversations regarding MOST form (copy of form and written educaitional material provided). Patient is aligned with Dr. Gearldine Shown opinion regarding palliative nature of current oral chemo regimen. We discussed Hospice eligibility criteria, which she meets, pending discontinuation of oral chemo.  2. Pain from metastatic disease; lower back: Well managed on current regimen of Methadone 10mg  tid, with 1-2 tabs/24h  MSO4 15mg  prn break through pain.   3. 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 Decreased appetite lately; bites and sips. Weight is 139 lbs, but large amount of LE edema accounts for significant fluid weight. Her rib cage is protrudant and UEs with significant wasting. Ht 5\' 5" , BMI 23.28kg/m2.   4. L LE wound: Dressing intact; changes by [facility] q week. Patient mentions area is approximately the size of her hand; is reddend with clear drainage. Mentions is stable in appearance.  5. Weakness:  Spends most of her time in [place]. Sleeps there as well. Able to stand independently.Uses walker for stability.  WC for out of  Home excersions.   6. Coping setting of life threatening illness: Patent trys to find, and focus on, the joys and gratitude of daily life. Family and friends are a great source of comfort and support; she feels she can freely share her fears and concerns with them.  Defers need for Palliative LCSW consult for now.   7. Constipation; opioid induced: Managed with  MiraLax qd -bid, Sorbitol qd. Self manages colostomy bag.  8. Follow up: NP visit Mon Aug 19th 66m; f/u MOST form. Monitor if decline/ ceassation of chemo,  would be hospice eligible.   I spent 75 minutes providing this consultation,  from 61am to 12:15pm.  More than 50% of the time in this consultation was spent coordinating communication.   HISTORY OF PRESENT ILLNESS:  Carolyn Barton is a 62 y.o. year old female with  colorectal cancer (diagnosed 02/2016)  with metastasis to the liver, and with a cystic/solid pelvic mass.  This pelvic tumor is is causing a mass effect on the pelvic vasculature (resulting in LE edema), and obstruction of the ureters resulting in hydronephrosis (s/p bilateral ureteral stents). Bowel obstruction from tumor, with subsequent colostomy placement. She has had CT aspiration of the pelvic cystic mass with transient improvement of LE edema. She had a L common femerol DVT, and bilateral PE (Xarelto). She is currently on salvage therapy with Londurf. Palliative Care was asked to help address goals of care.   CODE STATUS: DNR; form completed in home today. MOST form discussed; written resouce information provided. Family plans ongoing discussions to fine tune. PPS: Weak 40%% HOSPICE ELIGIBILITY/DIAGNOSIS: Would be eligible should she wish to discontinue any further chemo therapy.    PAST MEDICAL HISTORY:  Past Medical History:  Diagnosis Date  . Anxiety   . Aspiration of liquid 12/23/2017   pelvic cyst/mass  . Chronic back pain    lumbar    . Chronic constipation   .  Colostomy in place [facility])   . Current use of anticoagulant therapy    xarelto  . Depression   . Duplicated renal collecting system    bilaterally per CT 03-26-2018  . Dyspnea   . 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 Foley catheter in place   . GERD (gastroesophageal reflux  disease)   . History of cellulitis    04/ 2019  left leg;   2017 abdominal wall cellulitis  . History of chemotherapy last chemo 09-21-17  . History of DVT (deep vein thrombosis) 10/22/2017   non-occlusive left common femoral dvt--- on xarelto  . History of hiatal hernia   . History of pulmonary embolism 09/2017   Bilateral  . History of pulmonary embolus (PE) 10/22/2017   bilateral PE--  on xarelto  . History of sepsis 10-22-2017  admission [facility]   dx sepsis/ pyelonephritis with e.coli bacteriumia   . History of small bowel obstruction    secondary to colorectal carcinoma  s/p  surgical intervention w/ colostomy  . Hydronephrosis, bilateral    per CT 03-26-2018 persistent hydroureteronephrosis , superior renal collection system has stents with obstruction  . Hypokalemia    takes potassium  . Iron deficiency anemia 04/14/2016   treated w/ Iron infusions  . Liver metastasis ([place])    secondary to colorectal cancer  . Lymphedema of left leg   . Metastatic colorectal cancer [facility]) oncologist-- dr Benay Spice   dx 03-03-2016-- metastatic colorectal carcinoma--  s/p  exp. lap. for bowel obstruction--  rectal mass, peritoneal carcinomatosis, liver mets---  completed chemotherapy61/ 2018,  currently taking oral chemotherapy  . Scoliosis   . Skin rash    on legs seeing wound care specialist  . Spinal headache 2014   spinal anesthesia  . Urine retention 03/22/2018   due to bilateral ureteral obstruction  . Wears contact lenses     SOCIAL HX:  Social History   Tobacco Use  . Smoking status: Former Smoker    Packs/day: 0.25    Years: 1.00    Pack years: 0.25    Types: Cigarettes    Last attempt to quit: 10/03/1978    Years since quitting: 39.5  . Smokeless tobacco: Never Used  Substance Use Topics  . Alcohol use: No    Alcohol/week: 0.0 oz    ALLERGIES: No Known Allergies   PERTINENT MEDICATIONS:  Outpatient Encounter Medications as of 04/19/2018  Medication Sig  . DULoxetine  (CYMBALTA) 60 MG capsule Take 1 capsule (60 mg total) by mouth every evening.  . famotidine (PEPCID) 40 MG/5ML suspension Take 40 mg by mouth daily.  . ferrous sulfate 325 (65 FE) MG EC tablet Take 1 tablet (325 mg total) by mouth daily. (Patient taking differently: Take 325 mg by mouth every evening. )  . lidocaine-prilocaine (EMLA) cream Apply to port site one hour prior to use. Do not rub in. Cover with plastic. (Patient taking differently: Apply 1 application topically daily as needed (prior to port site being accessed.). Apply to port site one hour prior to use. Do not rub in. Cover with plastic.)  . methadone (DOLOPHINE) 10 MG tablet Take 10 mg by mouth 3 (three) times daily.   Marland Kitchen morphine (MSIR) 15 MG tablet Take 1-2 tablets (15-30 mg total) by mouth every 4 (four) hours as needed for severe pain.  Marland Kitchen omeprazole (PRILOSEC) 20 MG capsule Take 1 capsule (20 mg total) by mouth daily. (Patient taking differently: Take 20 mg by mouth daily as  needed. )  . ondansetron (ZOFRAN) 8 MG tablet Take 1 tablet (8 mg total) by mouth every 8 (eight) hours as needed for nausea or vomiting.  Marland Kitchen oxybutynin (DITROPAN) 5 MG tablet Take 1 tablet (5 mg total) by mouth every 8 (eight) hours as needed for bladder spasms.  . phenazopyridine (PYRIDIUM) 200 MG tablet Take 1 tablet (200 mg total) by mouth 3 (three) times daily as needed (for pain with urination).  . polyethylene glycol (MIRALAX / GLYCOLAX) packet Take 17 g by mouth daily as needed for mild constipation.   . potassium chloride (K-DUR) 10 MEQ tablet Take 1 tablet (10 mEq total) by mouth 2 (two) times daily.  . prochlorperazine (COMPAZINE) 10 MG tablet Take 1 tablet (10 mg total) by mouth every 6 (six) hours as needed for nausea or vomiting.  . sorbitol 70 % solution 30 mL every 4 hours until bowel movement. Then 30 mL twice daily for maintenance  . trifluridine-tipiracil (LONSURF) 20-8.19 MG tablet Take 3 tablets (60mg  trifluridine) by mouth 2 times daily,  immediately after food. Take on days 1-5 & 8-12 of each 28 day cycle  . XARELTO 20 MG TABS tablet TAKE 1 TABLET DAILY WITH SUPPER.   Facility-Administered Encounter Medications as of 04/19/2018  Medication  . alteplase (CATHFLO ACTIVASE) injection 2 mg  . sodium chloride 0.9 % injection 10 mL  . sodium chloride 0.9 % injection 10 mL    PHYSICAL EXAM:  Frail, fatigued appearing, cachectic older female sitting semi reposed in Optician, dispensing. Prominent rib cage; marked UE muscular/adipose wasting Cardiovascular: regular rate and rhythm without MRG Pulmonary: bibasilar insp crackes Abdomen: protruding, distended, tight. Hypoactive BS. Colostomy bag left abdomen GU: foley catheter intact GI: Left abdominal colostomy bad intact. Extremities: Marked LE edema to top of thighs; L > R. No joint deformities Skin: L upper chest port-a-cath intact; tubing readily seen as patient cachectic. L LE shin dressing intact.  Neurological: Weakness but otherwise nonfocal  Julianne Handler, NP

## 2018-04-20 ENCOUNTER — Encounter: Payer: Self-pay | Admitting: Internal Medicine

## 2018-04-23 ENCOUNTER — Other Ambulatory Visit: Payer: Self-pay | Admitting: Oncology

## 2018-04-23 DIAGNOSIS — C19 Malignant neoplasm of rectosigmoid junction: Secondary | ICD-10-CM

## 2018-04-26 ENCOUNTER — Other Ambulatory Visit: Payer: Self-pay | Admitting: *Deleted

## 2018-04-26 ENCOUNTER — Inpatient Hospital Stay (HOSPITAL_BASED_OUTPATIENT_CLINIC_OR_DEPARTMENT_OTHER): Payer: BLUE CROSS/BLUE SHIELD | Admitting: Oncology

## 2018-04-26 ENCOUNTER — Telehealth: Payer: Self-pay

## 2018-04-26 ENCOUNTER — Inpatient Hospital Stay: Payer: BLUE CROSS/BLUE SHIELD

## 2018-04-26 ENCOUNTER — Encounter: Payer: Self-pay | Admitting: General Practice

## 2018-04-26 VITALS — BP 133/90 | HR 112 | Temp 98.3°F | Resp 16 | Ht 65.0 in | Wt 133.3 lb

## 2018-04-26 DIAGNOSIS — C787 Secondary malignant neoplasm of liver and intrahepatic bile duct: Secondary | ICD-10-CM

## 2018-04-26 DIAGNOSIS — D509 Iron deficiency anemia, unspecified: Secondary | ICD-10-CM

## 2018-04-26 DIAGNOSIS — M549 Dorsalgia, unspecified: Secondary | ICD-10-CM

## 2018-04-26 DIAGNOSIS — D701 Agranulocytosis secondary to cancer chemotherapy: Secondary | ICD-10-CM | POA: Diagnosis not present

## 2018-04-26 DIAGNOSIS — C19 Malignant neoplasm of rectosigmoid junction: Secondary | ICD-10-CM

## 2018-04-26 DIAGNOSIS — C786 Secondary malignant neoplasm of retroperitoneum and peritoneum: Secondary | ICD-10-CM

## 2018-04-26 DIAGNOSIS — C189 Malignant neoplasm of colon, unspecified: Secondary | ICD-10-CM | POA: Diagnosis not present

## 2018-04-26 DIAGNOSIS — Z95828 Presence of other vascular implants and grafts: Secondary | ICD-10-CM

## 2018-04-26 DIAGNOSIS — Z79899 Other long term (current) drug therapy: Secondary | ICD-10-CM

## 2018-04-26 LAB — CBC WITH DIFFERENTIAL (CANCER CENTER ONLY)
BASOS ABS: 0 10*3/uL (ref 0.0–0.1)
Basophils Relative: 0 %
EOS PCT: 1 %
Eosinophils Absolute: 0 10*3/uL (ref 0.0–0.5)
HEMATOCRIT: 25.2 % — AB (ref 34.8–46.6)
Hemoglobin: 7.9 g/dL — ABNORMAL LOW (ref 11.6–15.9)
LYMPHS ABS: 0.7 10*3/uL — AB (ref 0.9–3.3)
Lymphocytes Relative: 22 %
MCH: 22.6 pg — AB (ref 25.1–34.0)
MCHC: 31.3 g/dL — AB (ref 31.5–36.0)
MCV: 72.2 fL — AB (ref 79.5–101.0)
MONOS PCT: 29 %
Monocytes Absolute: 0.9 10*3/uL (ref 0.1–0.9)
NEUTROS ABS: 1.4 10*3/uL — AB (ref 1.5–6.5)
Neutrophils Relative %: 48 %
PLATELETS: 384 10*3/uL (ref 145–400)
RBC: 3.49 MIL/uL — ABNORMAL LOW (ref 3.70–5.45)
RDW: 23.5 % — AB (ref 11.2–14.5)
WBC Count: 3 10*3/uL — ABNORMAL LOW (ref 3.9–10.3)

## 2018-04-26 LAB — CMP (CANCER CENTER ONLY)
ALBUMIN: 2 g/dL — AB (ref 3.5–5.0)
ALT: <6 U/L (ref 0–44)
ANION GAP: 8 (ref 5–15)
AST: 7 U/L — AB (ref 15–41)
Alkaline Phosphatase: 102 U/L (ref 38–126)
BILIRUBIN TOTAL: 0.4 mg/dL (ref 0.3–1.2)
BUN: 11 mg/dL (ref 8–23)
CHLORIDE: 95 mmol/L — AB (ref 98–111)
CO2: 28 mmol/L (ref 22–32)
Calcium: 9 mg/dL (ref 8.9–10.3)
Creatinine: 0.71 mg/dL (ref 0.44–1.00)
GFR, Est AFR Am: >60 mL/min (ref >60)
GFR, Estimated: >60 mL/min (ref >60)
Glucose, Bld: 91 mg/dL (ref 70–99)
POTASSIUM: 3.8 mmol/L (ref 3.5–5.1)
SODIUM: 131 mmol/L — AB (ref 135–145)
Total Protein: 6.6 g/dL (ref 6.5–8.1)

## 2018-04-26 LAB — SAMPLE TO BLOOD BANK

## 2018-04-26 MED ORDER — SODIUM CHLORIDE 0.9% FLUSH
10.0000 mL | INTRAVENOUS | Status: DC | PRN
  Administered 2018-04-26: 10 mL via INTRAVENOUS
  Filled 2018-04-26: qty 10

## 2018-04-26 MED ORDER — MORPHINE SULFATE 15 MG PO TABS: 15.0000 mg | ORAL_TABLET | ORAL | 0 refills | Status: DC | PRN

## 2018-04-26 MED ORDER — METHADONE HCL 10 MG PO TABS: ORAL_TABLET | ORAL | 0 refills | Status: DC

## 2018-04-26 MED ORDER — HEPARIN SOD (PORK) LOCK FLUSH 100 UNIT/ML IV SOLN
500.0000 [IU] | Freq: Once | INTRAVENOUS | Status: AC
  Administered 2018-04-26: 500 [IU] via INTRAVENOUS
  Filled 2018-04-26: qty 5

## 2018-04-26 NOTE — Telephone Encounter (Signed)
Printed avs and calender of upcoming appointment. Per 7/29 los 

## 2018-04-26 NOTE — Progress Notes (Signed)
[place] Spiritual Care Note  Followed up with Carolyn Barton, husband Purcell Nails, and brother [name] at [facility], accompanying them to their visit with Dr Benay Spice and providing pastoral presence, emotional support, and opportunity to process/identify goals at this point. Plan to f/u with [facility] by phone to schedule opportunity to talk personally (either in person or by phone, whichever is better for her).   [place], [place], [facility] Pager [phone] Voicemail [phone]

## 2018-04-26 NOTE — Progress Notes (Signed)
[facility] OFFICE PROGRESS NOTE   Diagnosis: Colon cancer  INTERVAL HISTORY:   Ms. Sorter returns as scheduled.  She is here with her husband and brother.  She completed a cycle of Lonsurf getting 04/05/2018.  She reports tolerating the treatment well.  No mouth sores, nausea, or diarrhea.  Her bowels are moving with sorbitol.  She has increased lower back pain.  She takes MSIR for breakthrough pain every 4 hours.  The MSIR helps.  She has bloody urine.  Leg edema has improved.  Her appetite is poor.  Objective:  Vital signs in last 24 hours:  Blood pressure 133/90, pulse (!) 112, temperature 98.3 F (36.8 C), temperature source Oral, resp. rate 16, height '5\' 5"'$  (1.651 m), weight 133 lb 4.8 oz (60.5 kg), SpO2 100 %.    HEENT: No thrush or ulcers Resp: Lungs clear bilaterally, no respiratory distress Cardio: Regular rate and rhythm GI: No hepatomegaly, left lower quadrant colostomy, mildly distended Vascular: Edema throughout the left greater than right leg.  Skin: Healing superficial ulcerations at the left pretibial area Blood-tinged urine in the Foley bag  Portacath/PICC-without erythema  Lab Results:  Lab Results  Component Value Date   WBC 3.0 (L) 04/26/2018   HGB 7.9 (L) 04/26/2018   HCT 25.2 (L) 04/26/2018   MCV 72.2 (L) 04/26/2018   PLT 384 04/26/2018   NEUTROABS 1.4 (L) 04/26/2018    CMP  Lab Results  Component Value Date   NA 131 (L) 04/26/2018   K 3.8 04/26/2018   CL 95 (L) 04/26/2018   CO2 28 04/26/2018   GLUCOSE 91 04/26/2018   BUN 11 04/26/2018   CREATININE 0.71 04/26/2018   CALCIUM 9.0 04/26/2018   PROT 6.6 04/26/2018   ALBUMIN 2.0 (L) 04/26/2018   AST 7 (L) 04/26/2018   ALT <6 04/26/2018   ALKPHOS 102 04/26/2018   BILITOT 0.4 04/26/2018   GFRNONAA >60 04/26/2018   GFRAA >60 04/26/2018    Lab Results  Component Value Date   CEA1 2.29 01/04/2018     Medications: I have reviewed the patient's current  medications.   Assessment/Plan: 1.Metastatic colorectal cancer-status post an exploratory laparotomy 03/03/2016 revealing a proximal rectal mass, peritoneal carcinomatosis, and liver metastases  Biopsy of peritoneal nodules 03/03/2016 confirmed metastatic adenocarcinoma consistent with a colon primary  Foundation 1 testing-MSI-stable, tumor mutation burden-low, no BRAF NRAS or KRAS mutation  Cycle 1 FOLFIRI/PANITUMUMAB 04/14/2016  Cycle 2 FOLFIRI/panitumumab 04/28/2016  Cycle 3 FOLFIRI/panitumumab 05/12/2016  Cycle 4 FOLFIRI/panitumumab 05/27/2016  Cycle 5 FOLFIRI/panitumumab 06/09/2016  Restaging CT abdomen/pelvis 06/20/2016-no evidence of disease progression, decreased left hepatic lesion  Cycle 6 FOLFIRI/panitumumab 06/23/2016  Cycle 7 FOLFIRI/panitumumab 07/07/2016  Cycle 8 FOLFIRI/panitumumab 07/21/2016  Cycle 9 FOLFIRI/PANITUMUMAB 08/04/2016  Cycle 10 FOLFIRI/panitumumab 08/18/2016  Restaging CT 08/29/2016 with interval decrease in the size of the medial segment left liver lesion. Lesion identified previously in the rectosigmoid colon not evident on the current study.  5-FU/PANITUMUMAB 09/01/2016  Xeloda 7 days on/7 days off and PANITUMUMAB every 3 weeks 09/15/2016 (awaiting insurance approval for Xeloda 09/15/2016)  CT abdomen/pelvis 12/25/2016-unchanged 5 mm right hepatic lesion, resolution of medial segment left liver lesion, no new liver lesion. Residual soft tissue fullness in the sigmoid , persistent distal esophageal wall thickening  Continuationof PANITUMUMAB every 3 weeks and Xeloda 7 days on/7 days off  CT 06/19/2017-new cystic/solid lesion in the left adnexa  Xeloda/panitumumab continued  CT 10/08/2016-progression of cystic pelvic mass  Cycle 1 FOLFIRI/Panitumumab 10/21/2017  Cystic pelvic mass  aspirated 10/30/2017  Cycle 2 FOLFIRI/panitumumab 11/09/2017  Cycle 3 FOLFIRI/panitumumab 11/23/2017  Cycle 4 FOLFIRI/Panitumumab 12/07/2017  Cycle 5  FOLFIRI/panitumumab 12/21/2017  CT pelvis 12/23/2017- complex cystic pelvic mass with mass-effect on the pelvic vasculature  CT aspiration of cystic pelvic mass 12/23/2017  Cycle 1 FOLFOX/Avastin 01/04/2018  Cycle 2 FOLFOX/Avastin 01/18/2018  Cycle 3 FOLFOX/Avastin 02/08/2018  Cycle 4 FOLFOX/Avastin 03/01/2018  CT abdomen/pelvis 03/26/2018-interval increase in size ofsolidand cystic mass within the pelvis.  Cycle 1 Lonsurf 04/05/2018  2.History of aBowel obstruction secondary to #1  3. Chronic back pain-maintained on methadone  4. Essential thrombocytosis  5. Early obstruction of the left ureter noted at the time of surgery 03/03/2016-Dr. Tannenbaum placed a left double-J stent 03/18/2016  New onset right hydronephrosis 10/11/2017  Right ureter stent placement 10/13/2017  6. Abdominal wall cellulitis 03/10/2016, blood cultures positive for coagulase negative staphylococcus-methicillin-resistant, status post treatment with vancomycin and Bactrim   8. Port-A-Cath placement 04/10/2016, interventional radiology  9. Iron deficiency anemia. Feraheme 11/05/2015 , 11/12/2015,and 04/14/2016-persistent anemia and red cell microcytosis, oral iron resumed 11/17/2016-stable  10. Pain/tenderness left posterior iliac region-resolved  11. Hypokalemia-started on potassium replacement 06/09/2016  12. History of skin rashand paronychiasecondary to [facility], she continues minocycline, moisturizers, and fluticasone as needed  13.Acute onset right low back pain 10/11/2017-likely secondary to obstruction of the right kidney;left ureter stent exchange and right ureter stent placement 10/13/2017  14.Left leg pain and edema-bilateral lower extremity venous Doppler negative for DVT1/15/2019 and 10/19/2017. Left common femoral DVT noted on CT venogram 10/27/2017;CT aspiration of pelvic cystic mass 10/30/2017 with subsequent marked improvement of leg edema.  15.  Admission 10/22/2017 with bilateral pulmonary embolism, heparin drip initiated, converted to Lovenox 10/23/2017, converted to Xarelto during the 10/27/2017 hospital admission  16. Admission 10/27/2017 with increased back/leg pain and a fever-blood and urine cultures positive fora resistant E. coli   Disposition: Ms. Kretz completed 1 cycle of Lonsurf.  She tolerated the treatment well.  She has mild neutropenia today.  We will check a CBC prior to starting cycle 2 Lonsurf 05/03/2018.  The increased lower back pain is likely related to tumor in the pelvis.  We adjusted the methadone dose today.  We will take over methadone and morphine prescribing as the pain is likely related to colon cancer.  We will arrange for a home hospital bed.  She has been evaluated by the home palliative care service.  We continued discussions regarding hospice care.  She decided to be placed on a no CODE BLUE status.  Ms. Shiffman will return for office visit on 05/24/2018.  We will see her sooner as needed.  25 minutes were spent with the patient today.  The majority of the time was used for counseling and coordination of care.  [name] Coder, MD  04/26/2018  1:23 PM

## 2018-04-26 NOTE — Telephone Encounter (Addendum)
Spoke with pt regarding returning to [facility] [place] for labs on next Monday. LVM for pt to return call    Pt voiced agreement with coming for labs. Scheduling message sent

## 2018-04-26 NOTE — Telephone Encounter (Signed)
Called to speak with Richarda Blade with [facility] regarding order for hospital bed at home. This RN also LVM with Dr. Maryjean Ka to inform that Dr. Benay Spice will be assuming the management of pain medication for pt.

## 2018-04-27 ENCOUNTER — Other Ambulatory Visit: Payer: Self-pay

## 2018-04-27 ENCOUNTER — Telehealth: Payer: Self-pay | Admitting: Oncology

## 2018-04-27 ENCOUNTER — Other Ambulatory Visit: Payer: BLUE CROSS/BLUE SHIELD

## 2018-04-27 ENCOUNTER — Ambulatory Visit: Payer: BLUE CROSS/BLUE SHIELD | Admitting: Nurse Practitioner

## 2018-04-27 DIAGNOSIS — C19 Malignant neoplasm of rectosigmoid junction: Secondary | ICD-10-CM

## 2018-04-27 NOTE — Telephone Encounter (Signed)
Scheduled appt per 7/30 sch message - pt aware of lab only appt for 8/5 - also verified that patient is aware of follow up appts scheduled per 7/29 los. Pt aware.

## 2018-04-28 ENCOUNTER — Encounter: Payer: Self-pay | Admitting: General Practice

## 2018-04-28 NOTE — Progress Notes (Signed)
[facility] Spiritual Care Note  LVM as planned, offering availability to meet in person or by phone, including Monday morning, when she is due to be on campus anyway. Will try [name] again if I don't hear back.   [place], [place], [facility] Pager [phone] Voicemail [phone]

## 2018-04-29 MED FILL — LONSURF 20 MG-8.19 MG TAB: 20-8.19 | 28 days supply | Qty: 60 | Fill #0

## 2018-04-30 ENCOUNTER — Encounter: Payer: Self-pay | Admitting: General Practice

## 2018-04-30 NOTE — Progress Notes (Signed)
[facility] Spiritual Care Note  Followed up with [facility] by phone, setting up an appt together following her lab+flush on Monday. We plan to meet in my office afterward (ca noon), unless she needs any infusion, in which case we'll meet in the infusion room. She is eager to receive her hospital bed later today and looks forward to an extended conversation together next week.   [place], [place], [facility] Pager [phone] Voicemail [phone]

## 2018-05-03 ENCOUNTER — Other Ambulatory Visit: Payer: Self-pay

## 2018-05-03 ENCOUNTER — Inpatient Hospital Stay: Payer: BLUE CROSS/BLUE SHIELD

## 2018-05-03 ENCOUNTER — Encounter: Payer: Self-pay | Admitting: General Practice

## 2018-05-03 ENCOUNTER — Inpatient Hospital Stay: Payer: BLUE CROSS/BLUE SHIELD | Attending: Oncology

## 2018-05-03 DIAGNOSIS — D649 Anemia, unspecified: Secondary | ICD-10-CM | POA: Insufficient documentation

## 2018-05-03 DIAGNOSIS — C786 Secondary malignant neoplasm of retroperitoneum and peritoneum: Secondary | ICD-10-CM | POA: Diagnosis not present

## 2018-05-03 DIAGNOSIS — R630 Anorexia: Secondary | ICD-10-CM | POA: Diagnosis not present

## 2018-05-03 DIAGNOSIS — Z7901 Long term (current) use of anticoagulants: Secondary | ICD-10-CM | POA: Diagnosis not present

## 2018-05-03 DIAGNOSIS — C20 Malignant neoplasm of rectum: Secondary | ICD-10-CM

## 2018-05-03 DIAGNOSIS — C787 Secondary malignant neoplasm of liver and intrahepatic bile duct: Secondary | ICD-10-CM | POA: Diagnosis not present

## 2018-05-03 DIAGNOSIS — I82409 Acute embolism and thrombosis of unspecified deep veins of unspecified lower extremity: Secondary | ICD-10-CM | POA: Insufficient documentation

## 2018-05-03 DIAGNOSIS — I2699 Other pulmonary embolism without acute cor pulmonale: Secondary | ICD-10-CM | POA: Diagnosis not present

## 2018-05-03 DIAGNOSIS — D509 Iron deficiency anemia, unspecified: Secondary | ICD-10-CM

## 2018-05-03 DIAGNOSIS — Z95828 Presence of other vascular implants and grafts: Secondary | ICD-10-CM

## 2018-05-03 DIAGNOSIS — R634 Abnormal weight loss: Secondary | ICD-10-CM | POA: Diagnosis not present

## 2018-05-03 DIAGNOSIS — C19 Malignant neoplasm of rectosigmoid junction: Secondary | ICD-10-CM

## 2018-05-03 DIAGNOSIS — R300 Dysuria: Secondary | ICD-10-CM

## 2018-05-03 LAB — CBC WITH DIFFERENTIAL (CANCER CENTER ONLY)
BASOS ABS: 0 10*3/uL (ref 0.0–0.1)
BASOS PCT: 0 %
Eosinophils Absolute: 0 10*3/uL (ref 0.0–0.5)
Eosinophils Relative: 0 %
HEMATOCRIT: 23.9 % — AB (ref 34.8–46.6)
Hemoglobin: 7.6 g/dL — ABNORMAL LOW (ref 11.6–15.9)
Lymphocytes Relative: 6 %
Lymphs Abs: 1.6 10*3/uL (ref 0.9–3.3)
MCH: 22.8 pg — ABNORMAL LOW (ref 25.1–34.0)
MCHC: 31.8 g/dL (ref 31.5–36.0)
MCV: 71.8 fL — ABNORMAL LOW (ref 79.5–101.0)
MONO ABS: 1.4 10*3/uL — AB (ref 0.1–0.9)
MONOS PCT: 5 %
Neutro Abs: 24.7 10*3/uL — ABNORMAL HIGH (ref 1.5–6.5)
Neutrophils Relative %: 89 %
PLATELETS: 485 10*3/uL — AB (ref 145–400)
RBC: 3.33 MIL/uL — ABNORMAL LOW (ref 3.70–5.45)
RDW: 24.4 % — ABNORMAL HIGH (ref 11.2–14.5)
WBC Count: 27.8 10*3/uL — ABNORMAL HIGH (ref 3.9–10.3)

## 2018-05-03 MED ORDER — SODIUM CHLORIDE 0.9 % IJ SOLN
10.0000 mL | INTRAMUSCULAR | Status: DC | PRN
  Administered 2018-05-03: 10 mL via INTRAVENOUS
  Filled 2018-05-03: qty 10

## 2018-05-03 MED ORDER — HEPARIN SOD (PORK) LOCK FLUSH 100 UNIT/ML IV SOLN
500.0000 [IU] | Freq: Once | INTRAVENOUS | Status: AC | PRN
  Administered 2018-05-03: 500 [IU] via INTRAVENOUS
  Filled 2018-05-03: qty 5

## 2018-05-03 NOTE — Progress Notes (Signed)
[facility] Spiritual Care Note  Met with [name] and husband Purcell Nails in my office as planned. Today they wanted to talk about death and dying, which is new in our pastoral relationship.   First we talked through how to talk with their daughters (23, 14) about [name]'s decline since they have seen her last, so that they are more prepared to see changes when they come to visit in the next week or two. [name] plans to speak with the girls prior to their visits, as well as to welcome them into caregiving so that they get to participate in supporting [name] and so that Purcell Nails can have a little respite from full caregiving responsibility.   Further, we talked about the sacred opportunities of connecting authentically when talking about death and dying directly, in contrast to opportunities that can be missed when EOL is/remains taboo. We also talked about common developmental tasks when facing mortality at EOL (I love you, forgive me, I forgive you, thank you, goodbye) and the healing potential in addressing anticipatory grief (vs the complicated grief that can come with sudden death). Normalized feelings, provided empathic listening, created space for them to share and risk vulnerability together, encouraged reflection and verbalizing goals/hopes, and served as supportive/affirming witness to their story.  Following for support, but please always page if immediate needs arise or circumstances change. Thank you.   [place], [place], [facility] Pager [phone] Voicemail [phone]

## 2018-05-03 NOTE — Progress Notes (Signed)
Unable to get clean urine sample from pt foley cath.  Dr. Benay Spice notified and orders received to cancel urine specimen order and stated pt is asymptomatic and does not need a UA and Culture.

## 2018-05-04 ENCOUNTER — Ambulatory Visit: Payer: BLUE CROSS/BLUE SHIELD | Admitting: Oncology

## 2018-05-04 ENCOUNTER — Telehealth: Payer: Self-pay | Admitting: Emergency Medicine

## 2018-05-04 ENCOUNTER — Other Ambulatory Visit: Payer: BLUE CROSS/BLUE SHIELD

## 2018-05-04 NOTE — Telephone Encounter (Addendum)
Pt verbalized understanding   ----- Message from Ladell Pier, MD sent at 05/03/2018  4:46 PM EDT ----- Please call patient, white count is elevated, okay to start Lonsurf, white cells are elevated-call for fever or symptoms of an infection

## 2018-05-17 ENCOUNTER — Other Ambulatory Visit: Payer: BLUE CROSS/BLUE SHIELD | Admitting: Nurse Practitioner

## 2018-05-17 ENCOUNTER — Telehealth: Payer: Self-pay | Admitting: *Deleted

## 2018-05-17 ENCOUNTER — Encounter: Payer: Self-pay | Admitting: Nurse Practitioner

## 2018-05-17 DIAGNOSIS — Z515 Encounter for palliative care: Secondary | ICD-10-CM

## 2018-05-17 DIAGNOSIS — G893 Neoplasm related pain (acute) (chronic): Secondary | ICD-10-CM

## 2018-05-17 DIAGNOSIS — C19 Malignant neoplasm of rectosigmoid junction: Secondary | ICD-10-CM

## 2018-05-17 DIAGNOSIS — E43 Unspecified severe protein-calorie malnutrition: Secondary | ICD-10-CM

## 2018-05-17 NOTE — Progress Notes (Signed)
PALLIATIVE CARE CONSULT VISIT   PATIENT NAME: Carolyn Barton DOB: 19-Dec-1955 MRN: [id number]  PRIMARY CARE PROVIDER:   Marton Redwood, MD  REFERRING PROVIDER:  Marton Redwood, MD [place],  [address]  RESPONSIBLE PARTY:   Reannon Candella (spouse) [phone]  ASSESSMENT/RECOMMENDATIONS :   Metastatic colorectal cancer s/p exp lap 03/03/2016 ; noted proximal rectal mass, peritoneal carcinomatosis and lliver mets  Left lower quadrant colostomy   -Lower back pain 2/2 cancer -lymphedema of left leg with broken skin -Followed by Dr. [name] Coder -currently receiving oral Lonsurf -no complaints of constipation; continue miralax and sorbitol -scheduled oral methadone  20qam then '10mg'$  BID; has MSIR q4hr for breakthrough pain -patient says current regimen is effective -husband changing dressing to LLL ulceration -no worsening of lymphedema  protein-calorie malnutition -GERD -Nausea and vomiting thought 2/2 chemo -appetite is poor; doesn't like supplements -continue to increase caloric intake and fluid intake -omeprazole -zofran effective  H/O DVT; bilateral pulmonary embolism -xarelto '20mg'$  qd  ACP -DNR     I spent 30 minutes providing this consultation,  from 11:00 to 11:30. More than 50% of the time in this consultation was spent coordinating communication.   HISTORY OF PRESENT ILLNESS:  Carolyn Barton is a 62 y.o. year old female with multiple medical problems including metastatic colorectal cancer with met to peritoneum and liver currently receiving oral chemotherapy, chronic lower back pain, protein-calorie malnutrition, lymphedema of left leg with ulceration,. Palliative Care was asked to help with symptom management, ongoing patient and family support and to address goals of care.   CODE STATUS: DNR  PPS: 30% HOSPICE ELIGIBILITY/DIAGNOSIS: TBD  PAST MEDICAL HISTORY:  Past Medical History:  Diagnosis Date  . Anxiety   . Aspiration of liquid  12/23/2017   pelvic cyst/mass  . Chronic back pain    lumbar    . Chronic constipation   . Colostomy in place [facility])   . Current use of anticoagulant therapy    xarelto  . Depression   . Duplicated renal collecting system    bilaterally per CT 03-26-2018  . Dyspnea   . 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 Foley catheter in place   . GERD (gastroesophageal reflux disease)   . History of cellulitis    04/ 2019  left leg;   2017 abdominal wall cellulitis  . History of chemotherapy last chemo 09-21-17  . History of DVT (deep vein thrombosis) 10/22/2017   non-occlusive left common femoral dvt--- on xarelto  . History of hiatal hernia   . History of pulmonary embolism 09/2017   Bilateral  . History of pulmonary embolus (PE) 10/22/2017   bilateral PE--  on xarelto  . History of sepsis 10-22-2017  admission [facility]   dx sepsis/ pyelonephritis with e.coli bacteriumia   . History of small bowel obstruction    secondary to colorectal carcinoma  s/p  surgical intervention w/ colostomy  . Hydronephrosis, bilateral    per CT 03-26-2018 persistent hydroureteronephrosis , superior renal collection system has stents with obstruction  . Hypokalemia    takes potassium  . Iron deficiency anemia 04/14/2016   treated w/ Iron infusions  . Liver metastasis ([place])    secondary to colorectal cancer  . Lymphedema of left leg   . Metastatic colorectal cancer [facility]) oncologist-- dr Benay Spice   dx 03-03-2016-- metastatic colorectal carcinoma--  s/p  exp. lap. for bowel obstruction--  rectal mass, peritoneal carcinomatosis, liver mets---  completed chemotherapy12/ 2018,  currently taking oral chemotherapy  . Scoliosis   . Skin rash    on legs seeing wound care specialist  . Spinal headache 2014   spinal anesthesia  . Urine retention 03/22/2018   due to bilateral ureteral obstruction  . Wears contact lenses     SOCIAL HX:  Social History   Tobacco Use  . Smoking status: Former  Smoker    Packs/day: 0.25    Years: 1.00    Pack years: 0.25    Types: Cigarettes    Last attempt to quit: 10/03/1978    Years since quitting: 39.6  . Smokeless tobacco: Never Used  Substance Use Topics  . Alcohol use: No    Alcohol/week: 0.0 standard drinks    ALLERGIES: No Known Allergies   PERTINENT MEDICATIONS:  Outpatient Encounter Medications as of 05/17/2018  Medication Sig  . DULoxetine (CYMBALTA) 60 MG capsule Take 1 capsule (60 mg total) by mouth every evening.  . famotidine (PEPCID) 40 MG/5ML suspension Take 40 mg by mouth daily.  . ferrous sulfate 325 (65 FE) MG EC tablet Take 1 tablet (325 mg total) by mouth daily. (Patient not taking: Reported on 04/19/2018)  . lidocaine-prilocaine (EMLA) cream Apply to port site one hour prior to use. Do not rub in. Cover with plastic. (Patient taking differently: Apply 1 application topically daily as needed (prior to port site being accessed.). Apply to port site one hour prior to use. Do not rub in. Cover with plastic.)  . LONSURF 20-8.19 MG tablet TAKE 3 TABLETS BY MOUTH 2 TIMES DAILY, IMMEDIATELY AFTER FOOD. TAKE ON DAYS 1-5 & 8-12 OF EACH 28 DAY CYCLE  . methadone (DOLOPHINE) 10 MG tablet Take 3 times a day. Take '20mg'$  by mouth in the AM. Take '10mg'$  by mouth subsequent times.  Marland Kitchen morphine (MSIR) 15 MG tablet Take 1-2 tablets (15-30 mg total) by mouth every 4 (four) hours as needed for severe pain.  Marland Kitchen omeprazole (PRILOSEC) 20 MG capsule Take 1 capsule (20 mg total) by mouth daily. (Patient taking differently: Take 20 mg by mouth daily as needed. )  . ondansetron (ZOFRAN) 8 MG tablet Take 1 tablet (8 mg total) by mouth every 8 (eight) hours as needed for nausea or vomiting.  Marland Kitchen oxybutynin (DITROPAN) 5 MG tablet Take 1 tablet (5 mg total) by mouth every 8 (eight) hours as needed for bladder spasms.  . phenazopyridine (PYRIDIUM) 200 MG tablet Take 1 tablet (200 mg total) by mouth 3 (three) times daily as needed (for pain with urination).  .  polyethylene glycol (MIRALAX / GLYCOLAX) packet Take 17 g by mouth daily as needed for mild constipation.   . potassium chloride (K-DUR) 10 MEQ tablet Take 1 tablet (10 mEq total) by mouth 2 (two) times daily. (Patient not taking: Reported on 04/19/2018)  . prochlorperazine (COMPAZINE) 10 MG tablet Take 1 tablet (10 mg total) by mouth every 6 (six) hours as needed for nausea or vomiting.  . sorbitol 70 % solution 30 mL every 4 hours until bowel movement. Then 30 mL twice daily for maintenance  . XARELTO 20 MG TABS tablet TAKE 1 TABLET DAILY WITH SUPPER.   Facility-Administered Encounter Medications as of 05/17/2018  Medication  . alteplase (CATHFLO ACTIVASE) injection 2 mg  . sodium chloride 0.9 % injection 10 mL  . sodium chloride 0.9 % injection 10 mL    PHYSICAL EXAM:   General: NAD, frail appearing, cachetic with temporal wasting Cardiovascular: regular rate and rhythm Pulmonary: clear ant fields Abdomen:  soft, nontender, + bowel sounds, LLQ colostomy GU: no suprapubic tenderness Extremities: +lymphedema of left leg, no joint deformities Skin: ulceration to left lower leg (not visualized) Neurological: Weakness but otherwise nonfocal  Benen Weida G [place], NP

## 2018-05-17 NOTE — Telephone Encounter (Signed)
Message left on VM in TRIAGE from pt.   In attempt to transfer message to desk nurse -message deleted by error.  Per message pt is requesting " new supply order to be faxed to [facility] so I can get them delivered to my home"  This note will be sent to MD and nurse at the desk for additional follow up with pt to coordinate order arrangements.

## 2018-05-18 ENCOUNTER — Other Ambulatory Visit: Payer: Self-pay | Admitting: Oncology

## 2018-05-18 DIAGNOSIS — I2699 Other pulmonary embolism without acute cor pulmonale: Secondary | ICD-10-CM

## 2018-05-20 ENCOUNTER — Other Ambulatory Visit: Payer: Self-pay | Admitting: Oncology

## 2018-05-20 DIAGNOSIS — C19 Malignant neoplasm of rectosigmoid junction: Secondary | ICD-10-CM

## 2018-05-24 ENCOUNTER — Inpatient Hospital Stay: Payer: BLUE CROSS/BLUE SHIELD

## 2018-05-24 ENCOUNTER — Telehealth: Payer: Self-pay | Admitting: Emergency Medicine

## 2018-05-24 ENCOUNTER — Inpatient Hospital Stay (HOSPITAL_BASED_OUTPATIENT_CLINIC_OR_DEPARTMENT_OTHER): Payer: BLUE CROSS/BLUE SHIELD | Admitting: Nurse Practitioner

## 2018-05-24 ENCOUNTER — Encounter: Payer: Self-pay | Admitting: General Practice

## 2018-05-24 ENCOUNTER — Encounter: Payer: Self-pay | Admitting: Nurse Practitioner

## 2018-05-24 VITALS — BP 104/67 | HR 81 | Temp 97.6°F | Resp 18 | Ht 65.0 in | Wt 120.1 lb

## 2018-05-24 DIAGNOSIS — R630 Anorexia: Secondary | ICD-10-CM

## 2018-05-24 DIAGNOSIS — D649 Anemia, unspecified: Secondary | ICD-10-CM | POA: Diagnosis not present

## 2018-05-24 DIAGNOSIS — Z95828 Presence of other vascular implants and grafts: Secondary | ICD-10-CM

## 2018-05-24 DIAGNOSIS — C786 Secondary malignant neoplasm of retroperitoneum and peritoneum: Secondary | ICD-10-CM

## 2018-05-24 DIAGNOSIS — I82409 Acute embolism and thrombosis of unspecified deep veins of unspecified lower extremity: Secondary | ICD-10-CM

## 2018-05-24 DIAGNOSIS — C787 Secondary malignant neoplasm of liver and intrahepatic bile duct: Secondary | ICD-10-CM

## 2018-05-24 DIAGNOSIS — I2699 Other pulmonary embolism without acute cor pulmonale: Secondary | ICD-10-CM

## 2018-05-24 DIAGNOSIS — C20 Malignant neoplasm of rectum: Secondary | ICD-10-CM

## 2018-05-24 DIAGNOSIS — Z7901 Long term (current) use of anticoagulants: Secondary | ICD-10-CM

## 2018-05-24 DIAGNOSIS — R634 Abnormal weight loss: Secondary | ICD-10-CM

## 2018-05-24 DIAGNOSIS — C19 Malignant neoplasm of rectosigmoid junction: Secondary | ICD-10-CM

## 2018-05-24 DIAGNOSIS — D509 Iron deficiency anemia, unspecified: Secondary | ICD-10-CM

## 2018-05-24 LAB — CBC WITH DIFFERENTIAL (CANCER CENTER ONLY)
BASOS ABS: 0 10*3/uL (ref 0.0–0.1)
BASOS PCT: 0 %
Eosinophils Absolute: 0.1 10*3/uL (ref 0.0–0.5)
Eosinophils Relative: 2 %
HEMATOCRIT: 17.4 % — AB (ref 34.8–46.6)
HEMOGLOBIN: 5.3 g/dL — AB (ref 11.6–15.9)
Lymphocytes Relative: 20 %
Lymphs Abs: 0.9 10*3/uL (ref 0.9–3.3)
MCH: 22.6 pg — ABNORMAL LOW (ref 25.1–34.0)
MCHC: 30.5 g/dL — ABNORMAL LOW (ref 31.5–36.0)
MCV: 74 fL — ABNORMAL LOW (ref 79.5–101.0)
MONO ABS: 0.9 10*3/uL (ref 0.1–0.9)
MONOS PCT: 20 %
NEUTROS ABS: 2.4 10*3/uL (ref 1.5–6.5)
NEUTROS PCT: 58 %
Platelet Count: 583 10*3/uL — ABNORMAL HIGH (ref 145–400)
RBC: 2.35 MIL/uL — ABNORMAL LOW (ref 3.70–5.45)
RDW: 25.2 % — AB (ref 11.2–14.5)
WBC Count: 4.3 10*3/uL (ref 3.9–10.3)

## 2018-05-24 LAB — SAMPLE TO BLOOD BANK

## 2018-05-24 LAB — CMP (CANCER CENTER ONLY)
ALBUMIN: 2 g/dL — AB (ref 3.5–5.0)
AST: 8 U/L — AB (ref 15–41)
Alkaline Phosphatase: 81 U/L (ref 38–126)
Anion gap: 8 (ref 5–15)
BUN: 20 mg/dL (ref 8–23)
CHLORIDE: 100 mmol/L (ref 98–111)
CO2: 27 mmol/L (ref 22–32)
CREATININE: 1.37 mg/dL — AB (ref 0.44–1.00)
Calcium: 9 mg/dL (ref 8.9–10.3)
GFR, Est AFR Am: 47 mL/min — ABNORMAL LOW (ref >60)
GFR, Estimated: 41 mL/min — ABNORMAL LOW (ref >60)
Glucose, Bld: 90 mg/dL (ref 70–99)
POTASSIUM: 4.8 mmol/L (ref 3.5–5.1)
SODIUM: 135 mmol/L (ref 135–145)
Total Bilirubin: 0.4 mg/dL (ref 0.3–1.2)
Total Protein: 6.8 g/dL (ref 6.5–8.1)

## 2018-05-24 LAB — PREPARE RBC (CROSSMATCH)

## 2018-05-24 MED ORDER — HEPARIN SOD (PORK) LOCK FLUSH 100 UNIT/ML IV SOLN
500.0000 [IU] | Freq: Every day | INTRAVENOUS | Status: AC | PRN
  Administered 2018-05-24: 500 [IU]
  Filled 2018-05-24: qty 5

## 2018-05-24 MED ORDER — SODIUM CHLORIDE 0.9% IV SOLUTION
250.0000 mL | Freq: Once | INTRAVENOUS | Status: AC
  Administered 2018-05-24: 250 mL via INTRAVENOUS
  Filled 2018-05-24: qty 250

## 2018-05-24 MED ORDER — SODIUM CHLORIDE 0.9% FLUSH
10.0000 mL | INTRAVENOUS | Status: AC | PRN
  Administered 2018-05-24: 10 mL
  Filled 2018-05-24: qty 10

## 2018-05-24 MED ORDER — HEPARIN SOD (PORK) LOCK FLUSH 100 UNIT/ML IV SOLN
500.0000 [IU] | Freq: Once | INTRAVENOUS | Status: AC | PRN
  Administered 2018-05-24: 500 [IU] via INTRAVENOUS
  Filled 2018-05-24: qty 5

## 2018-05-24 MED ORDER — SODIUM CHLORIDE 0.9 % IJ SOLN
10.0000 mL | INTRAMUSCULAR | Status: DC | PRN
  Administered 2018-05-24: 10 mL via INTRAVENOUS
  Filled 2018-05-24: qty 10

## 2018-05-24 NOTE — Progress Notes (Signed)
[facility] Spiritual Care Note  Accompanied [name], husband Purcell Nails, and brother [name] to her appt with Marlynn Perking and Dr Benay Spice today, providing pastoral presence, empathic listening, and pastoral reflection as they processed the change from oral chemo to hospice support. [name] has identified key family events and other conversation goals as priorities for best dedicating her energy and seeking sx mgmt for QOL. Family values chaplain support and knows to contact Spiritual Care anytime. (In fact, brother [name] has just taken a job as a Clinical biochemist with [employer], which begins in ca 1 week! He knows how to utilize chaplain support for himself as well.)   Pauline Good, [facility] Pager [phone] Voicemail [phone]

## 2018-05-24 NOTE — Progress Notes (Addendum)
[place] OFFICE PROGRESS NOTE   Diagnosis: Colon cancer  INTERVAL HISTORY:   Carolyn Barton returns as scheduled.  She completed cycle 2 Lonsurf beginning 05/03/2018.  She had several days of nausea/vomiting.  No mouth sores.  No diarrhea.  No rash.  She notes improved pain control since adjustment of the methadone dose following her last office visit.  Last night she had an episode of pain involving the right hip region.  No right hip pain today.  Appetite is poor.  She is losing weight.  Objective:  Vital signs in last 24 hours:  Blood pressure 104/67, pulse 81, temperature 97.6 F (36.4 C), temperature source Oral, resp. rate 18, height _0  (1.651 m), weight 120 lb 1.6 oz (54.5 kg).   Cachectic appearance. HEENT: No thrush or ulcers. Resp: Lungs clear bilaterally. Cardio: Regular rate and rhythm. GI: Abdomen appears mildly distended.  Left lower quadrant colostomy. Vascular: Edema throughout the left greater than right leg.  Skin: Superficial ulcerations at the left pretibial region. Port-A-Cath without erythema.   Lab Results:  Lab Results  Component Value Date   WBC 4.3 05/24/2018   HGB 5.3 (LL) 05/24/2018   HCT 17.4 (L) 05/24/2018   MCV 74.0 (L) 05/24/2018   PLT 583 (H) 05/24/2018   NEUTROABS 2.4 05/24/2018    Imaging:  No results found.  Medications: I have reviewed the patient's current medications.  Assessment/Plan: 1.Metastatic colorectal cancer-status post an exploratory laparotomy 03/03/2016 revealing a proximal rectal mass, peritoneal carcinomatosis, and liver metastases  Biopsy of peritoneal nodules 03/03/2016 confirmed metastatic adenocarcinoma consistent with a colon primary  Foundation 1 testing-MSI-stable, tumor mutation burden-low, no BRAF NRAS or KRAS mutation  Cycle 1 FOLFIRI/PANITUMUMAB 04/14/2016  Cycle 2 FOLFIRI/panitumumab 04/28/2016  Cycle 3 FOLFIRI/panitumumab 05/12/2016  Cycle 4 FOLFIRI/panitumumab  05/27/2016  Cycle 5 FOLFIRI/panitumumab 06/09/2016  Restaging CT abdomen/pelvis 06/20/2016-no evidence of disease progression, decreased left hepatic lesion  Cycle 6 FOLFIRI/panitumumab 06/23/2016  Cycle 7 FOLFIRI/panitumumab 07/07/2016  Cycle 8 FOLFIRI/panitumumab 07/21/2016  Cycle 9 FOLFIRI/PANITUMUMAB 08/04/2016  Cycle 10 FOLFIRI/panitumumab 08/18/2016  Restaging CT 08/29/2016 with interval decrease in the size of the medial segment left liver lesion. Lesion identified previously in the rectosigmoid colon not evident on the current study.  5-FU/PANITUMUMAB 09/01/2016  Xeloda 7 days on/7 days off and PANITUMUMAB every 3 weeks 09/15/2016 (awaiting insurance approval for Xeloda 09/15/2016)  CT abdomen/pelvis 12/25/2016-unchanged 5 mm right hepatic lesion, resolution of medial segment left liver lesion, no new liver lesion. Residual soft tissue fullness in the sigmoid , persistent distal esophageal wall thickening  Continuationof PANITUMUMAB every 3 weeks and Xeloda 7 days on/7 days off  CT 06/19/2017-new cystic/solid lesion in the left adnexa  Xeloda/panitumumab continued  CT 10/08/2016-progression of cystic pelvic mass  Cycle 1 FOLFIRI/Panitumumab 10/21/2017  Cystic pelvic mass aspirated 10/30/2017  Cycle 2 FOLFIRI/panitumumab 11/09/2017  Cycle 3 FOLFIRI/panitumumab 11/23/2017  Cycle 4 FOLFIRI/Panitumumab 12/07/2017  Cycle 5 FOLFIRI/panitumumab 12/21/2017  CT pelvis 12/23/2017- complex cystic pelvic mass with mass-effect on the pelvic vasculature  CT aspiration of cystic pelvic mass 12/23/2017  Cycle 1 FOLFOX/Avastin 01/04/2018  Cycle 2 FOLFOX/Avastin 01/18/2018  Cycle 3 FOLFOX/Avastin 02/08/2018  Cycle 4 FOLFOX/Avastin 03/01/2018  CT abdomen/pelvis 03/26/2018-interval increase in size ofsolidand cystic mass within the pelvis.  Cycle 1Lonsurf7/04/2018  Cycle 2 Lonsurf 05/03/2018  2.History of aBowel obstruction secondary to #1  3. Chronic back pain-maintained on  methadone  4. Essential thrombocytosis  5. Early obstruction of the left ureter noted at the time of surgery 03/03/2016-Dr. Gaynelle Arabian  placed a left double-J stent 03/18/2016  New onset right hydronephrosis 10/11/2017  Right ureter stent placement 10/13/2017  6. Abdominal wall cellulitis 03/10/2016, blood cultures positive for coagulase negative staphylococcus-methicillin-resistant, status post treatment with vancomycin and Bactrim   8. Port-A-Cath placement 04/10/2016, interventional radiology  9. Iron deficiency anemia. Feraheme 11/05/2015 , 11/12/2015,and 04/14/2016-persistent anemia and red cell microcytosis, oral iron resumed 11/17/2016-stable  10. Pain/tenderness left posterior iliac region-resolved  11. Hypokalemia-started on potassium replacement 06/09/2016  12. History of skin rashand paronychiasecondary to [facility], she continues minocycline, moisturizers, and fluticasone as needed  13.Acute onset right low back pain 10/11/2017-likely secondary to obstruction of the right kidney;left ureter stent exchange and right ureter stent placement 10/13/2017  14.Left leg pain and edema-bilateral lower extremity venous Doppler negative for DVT1/15/2019 and 10/19/2017. Left common femoral DVT noted on CT venogram 10/27/2017;CT aspiration of pelvic cystic mass 10/30/2017 with subsequent marked improvement of leg edema.  15. Admission 10/22/2017 with bilateral pulmonary embolism, heparin drip initiated, converted to Lovenox 10/23/2017, converted to Xarelto during the 10/27/2017 hospital admission  16. Admission 10/27/2017 with increased back/leg pain and a fever-blood and urine cultures positive fora resistant E. coli   [name] has completed 2 cycles of Lonsurf.  Her performance status continues to decline.  She understands this is most likely due to progression of the cancer.  Dr. Benay Spice recommends discontinuation of Lonsurf and a  referral to the [facility] program.  She agrees with this plan.  We confirmed NO CODE BLUE status with Carolyn Barton at today's visit.  The progressive anemia is likely multifactorial, and in part may be related to bleeding though she is not aware of any bleeding.  We are arranging for a blood transfusion today.  She is on Xarelto of PE/DVT.  We will repeat a CBC when she returns for her next visit and decide whether or not to continue Xarelto.  She notes improved pain control since the adjustment of her pain medication last month.  Plan to continue the same.  She will return for lab and follow-up in approximately 2 weeks.  She will contact the office in the interim with any problems.  Patient seen with Dr. Benay Spice.  25 minutes were spent face-to-face at today's visit with the majority of that time involved in counseling/coordination of care.    Ned Card ANP/GNP-BC   05/24/2018  12:47 PM   This was a shared visit with Ned Card.  Carolyn Barton was interviewed and examined.  Her clinical status appears to be declining while on Lonsurf.  She has severe anemia today.  The anemia is likely multifactorial including components related to chronic disease, bleeding, chemotherapy, and malnutrition.  She will be transfused 2 units of packed red blood cells for comfort.  We discussed the indication for continuing chemotherapy versus hospice care.  I recommend Hospice care.  She is in agreement.  She has decided on a no CODE status.  Carolyn Barton will return for an office visit in 2 weeks.  [name], MD

## 2018-05-24 NOTE — Telephone Encounter (Signed)
Longsurf rx canceled @ [facility]. Hospice referral sent to [facility].

## 2018-05-24 NOTE — Patient Instructions (Signed)

## 2018-05-25 ENCOUNTER — Telehealth: Payer: Self-pay | Admitting: Nurse Practitioner

## 2018-05-25 LAB — BPAM RBC
BLOOD PRODUCT EXPIRATION DATE: 201909142359
BLOOD PRODUCT EXPIRATION DATE: 201909172359
ISSUE DATE / TIME: 201908261329
ISSUE DATE / TIME: 201908261537
UNIT TYPE AND RH: 600
Unit Type and Rh: 600

## 2018-05-25 LAB — TYPE AND SCREEN
ABO/RH(D): A NEG
Antibody Screen: NEGATIVE
Unit division: 0
Unit division: 0

## 2018-05-25 NOTE — Telephone Encounter (Signed)
Unable to leave vmail - scheduled appt per 8/26 los - sent reminder letter in the mail with appt date and time.

## 2018-05-27 ENCOUNTER — Other Ambulatory Visit: Payer: Self-pay | Admitting: *Deleted

## 2018-05-27 DIAGNOSIS — C19 Malignant neoplasm of rectosigmoid junction: Secondary | ICD-10-CM

## 2018-05-27 MED ORDER — METHADONE HCL 10 MG PO TABS: ORAL_TABLET | ORAL | 0 refills | Status: AC

## 2018-05-27 MED ORDER — MORPHINE SULFATE 15 MG PO TABS: 15.0000 mg | ORAL_TABLET | ORAL | 0 refills | Status: AC | PRN

## 2018-06-09 ENCOUNTER — Telehealth: Payer: Self-pay

## 2018-06-09 ENCOUNTER — Inpatient Hospital Stay: Payer: BLUE CROSS/BLUE SHIELD

## 2018-06-09 ENCOUNTER — Inpatient Hospital Stay: Payer: BLUE CROSS/BLUE SHIELD | Attending: Oncology | Admitting: Oncology

## 2018-06-09 ENCOUNTER — Encounter: Payer: Self-pay | Admitting: General Practice

## 2018-06-09 DIAGNOSIS — C20 Malignant neoplasm of rectum: Secondary | ICD-10-CM | POA: Diagnosis present

## 2018-06-09 DIAGNOSIS — C19 Malignant neoplasm of rectosigmoid junction: Secondary | ICD-10-CM

## 2018-06-09 DIAGNOSIS — C786 Secondary malignant neoplasm of retroperitoneum and peritoneum: Secondary | ICD-10-CM | POA: Diagnosis not present

## 2018-06-09 DIAGNOSIS — D509 Iron deficiency anemia, unspecified: Secondary | ICD-10-CM

## 2018-06-09 DIAGNOSIS — I82409 Acute embolism and thrombosis of unspecified deep veins of unspecified lower extremity: Secondary | ICD-10-CM | POA: Insufficient documentation

## 2018-06-09 DIAGNOSIS — Z7901 Long term (current) use of anticoagulants: Secondary | ICD-10-CM | POA: Diagnosis not present

## 2018-06-09 DIAGNOSIS — C189 Malignant neoplasm of colon, unspecified: Secondary | ICD-10-CM

## 2018-06-09 DIAGNOSIS — C787 Secondary malignant neoplasm of liver and intrahepatic bile duct: Secondary | ICD-10-CM | POA: Insufficient documentation

## 2018-06-09 DIAGNOSIS — Z95828 Presence of other vascular implants and grafts: Secondary | ICD-10-CM

## 2018-06-09 DIAGNOSIS — I2699 Other pulmonary embolism without acute cor pulmonale: Secondary | ICD-10-CM | POA: Insufficient documentation

## 2018-06-09 DIAGNOSIS — Z933 Colostomy status: Secondary | ICD-10-CM | POA: Diagnosis not present

## 2018-06-09 LAB — CBC WITH DIFFERENTIAL (CANCER CENTER ONLY)
Basophils Absolute: 0 10*3/uL (ref 0.0–0.1)
Basophils Relative: 0 %
EOS ABS: 0 10*3/uL (ref 0.0–0.5)
Eosinophils Relative: 0 %
HCT: 24.3 % — ABNORMAL LOW (ref 34.8–46.6)
HEMOGLOBIN: 7.6 g/dL — AB (ref 11.6–15.9)
Lymphocytes Relative: 3 %
Lymphs Abs: 0.6 10*3/uL — ABNORMAL LOW (ref 0.9–3.3)
MCH: 25 pg — AB (ref 25.1–34.0)
MCHC: 31.3 g/dL — AB (ref 31.5–36.0)
MCV: 79.9 fL (ref 79.5–101.0)
Monocytes Absolute: 1 10*3/uL — ABNORMAL HIGH (ref 0.1–0.9)
Monocytes Relative: 5 %
NEUTROS PCT: 92 %
Neutro Abs: 16.8 10*3/uL — ABNORMAL HIGH (ref 1.5–6.5)
Platelet Count: 422 10*3/uL — ABNORMAL HIGH (ref 145–400)
RBC: 3.04 MIL/uL — ABNORMAL LOW (ref 3.70–5.45)
RDW: 23.3 % — ABNORMAL HIGH (ref 11.2–14.5)
WBC Count: 18.4 10*3/uL — ABNORMAL HIGH (ref 3.9–10.3)

## 2018-06-09 LAB — SAMPLE TO BLOOD BANK

## 2018-06-09 MED ORDER — SODIUM CHLORIDE 0.9 % IJ SOLN
10.0000 mL | INTRAMUSCULAR | Status: DC | PRN
  Administered 2018-06-09: 10 mL via INTRAVENOUS
  Filled 2018-06-09: qty 10

## 2018-06-09 MED ORDER — PROCHLORPERAZINE MALEATE 10 MG PO TABS: 10.0000 mg | ORAL_TABLET | Freq: Four times a day (QID) | ORAL | 2 refills | Status: AC | PRN

## 2018-06-09 MED ORDER — HEPARIN SOD (PORK) LOCK FLUSH 100 UNIT/ML IV SOLN
500.0000 [IU] | Freq: Once | INTRAVENOUS | Status: AC | PRN
  Administered 2018-06-09: 500 [IU] via INTRAVENOUS
  Filled 2018-06-09: qty 5

## 2018-06-09 NOTE — Telephone Encounter (Signed)
Printed avs and calender of upcoming appointment. Per 9/11 los 

## 2018-06-09 NOTE — Progress Notes (Signed)
[place] Spiritual Care Note  Followed up with Carolyn Barton and older daughter Carolyn Barton for spiritual and emotional support. 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 Carolyn Barton appeared weaker and lower energy today, participating less in conversation and nodding off during our encounter. She is thankful for ways to reduce appts because travel is now so challenging and tiring for her; we talked about conserving energy for her priorities and goals.  Following Carolyn Barton and family, including [name] (former [facility] chaplain resident, now chaplain at [facility]), but please also page when needs arise/circumstances change. Thank you.   [place], [place], [facility] Pager [phone] Voicemail [phone]

## 2018-06-09 NOTE — Progress Notes (Signed)
[place] OFFICE PROGRESS NOTE   Diagnosis: Colon cancer  INTERVAL HISTORY:   Carolyn Barton returns for a scheduled visit.  She has enrolled in the [facility] hospice program.  She says this is helping.  Her pain is under adequate control with methadone and MSIR.  Objective:  Vital signs in last 24 hours:  Blood pressure 119/79, pulse (!) 106, temperature (!) 97.5 F (36.4 C), temperature source Oral, resp. rate 18, SpO2 100 %.    HEENT: No thrush Resp: Inspiratory rhonchi at the left posterior base, no respiratory distress Cardio: Regular rate and rhythm GI: Distended, left lower quadrant colostomy with formed brown stool Vascular: Pitting edema throughout both legs  Skin: Healing ulceration at the left pretibial area  Portacath/PICC-without erythema  Lab Results:  Lab Results  Component Value Date   WBC 18.4 (H) 06/09/2018   HGB 7.6 (L) 06/09/2018   HCT 24.3 (L) 06/09/2018   MCV 79.9 06/09/2018   PLT 422 (H) 06/09/2018   NEUTROABS 16.8 (H) 06/09/2018     Medications: I have reviewed the patient's current medications.   Assessment/Plan: 1.Metastatic colorectal cancer-status post an exploratory laparotomy 03/03/2016 revealing a proximal rectal mass, peritoneal carcinomatosis, and liver metastases  Biopsy of peritoneal nodules 03/03/2016 confirmed metastatic adenocarcinoma consistent with a colon primary  Foundation 1 testing-MSI-stable, tumor mutation burden-low, no BRAF NRAS or KRAS mutation  Cycle 1 FOLFIRI/PANITUMUMAB 04/14/2016  Cycle 2 FOLFIRI/panitumumab 04/28/2016  Cycle 3 FOLFIRI/panitumumab 05/12/2016  Cycle 4 FOLFIRI/panitumumab 05/27/2016  Cycle 5 FOLFIRI/panitumumab 06/09/2016  Restaging CT abdomen/pelvis 06/20/2016-no evidence of disease progression, decreased left hepatic lesion  Cycle 6 FOLFIRI/panitumumab 06/23/2016  Cycle 7 FOLFIRI/panitumumab 07/07/2016  Cycle 8 FOLFIRI/panitumumab 07/21/2016  Cycle 9  FOLFIRI/PANITUMUMAB 08/04/2016  Cycle 10 FOLFIRI/panitumumab 08/18/2016  Restaging CT 08/29/2016 with interval decrease in the size of the medial segment left liver lesion. Lesion identified previously in the rectosigmoid colon not evident on the current study.  5-FU/PANITUMUMAB 09/01/2016  Xeloda 7 days on/7 days off and PANITUMUMAB every 3 weeks 09/15/2016 (awaiting insurance approval for Xeloda 09/15/2016)  CT abdomen/pelvis 12/25/2016-unchanged 5 mm right hepatic lesion, resolution of medial segment left liver lesion, no new liver lesion. Residual soft tissue fullness in the sigmoid , persistent distal esophageal wall thickening  Continuationof PANITUMUMAB every 3 weeks and Xeloda 7 days on/7 days off  CT 06/19/2017-new cystic/solid lesion in the left adnexa  Xeloda/panitumumab continued  CT 10/08/2016-progression of cystic pelvic mass  Cycle 1 FOLFIRI/Panitumumab 10/21/2017  Cystic pelvic mass aspirated 10/30/2017  Cycle 2 FOLFIRI/panitumumab 11/09/2017  Cycle 3 FOLFIRI/panitumumab 11/23/2017  Cycle 4 FOLFIRI/Panitumumab 12/07/2017  Cycle 5 FOLFIRI/panitumumab 12/21/2017  CT pelvis 12/23/2017- complex cystic pelvic mass with mass-effect on the pelvic vasculature  CT aspiration of cystic pelvic mass 12/23/2017  Cycle 1 FOLFOX/Avastin 01/04/2018  Cycle 2 FOLFOX/Avastin 01/18/2018  Cycle 3 FOLFOX/Avastin 02/08/2018  Cycle 4 FOLFOX/Avastin 03/01/2018  CT abdomen/pelvis 03/26/2018-interval increase in size ofsolidand cystic mass within the pelvis.  Cycle 1Lonsurf7/04/2018  Cycle 2 Lonsurf 05/03/2018  2.History of aBowel obstruction secondary to #1  3. Chronic back pain-maintained on methadone  4. Essential thrombocytosis  5. Early obstruction of the left ureter noted at the time of surgery 03/03/2016-Dr. Tannenbaum placed a left double-J stent 03/18/2016  New onset right hydronephrosis 10/11/2017  Right ureter stent placement 10/13/2017  6. Abdominal wall  cellulitis 03/10/2016, blood cultures positive for coagulase negative staphylococcus-methicillin-resistant, status post treatment with vancomycin and Bactrim   8. Port-A-Cath placement 04/10/2016, interventional radiology  9. Iron deficiency anemia. Feraheme 11/05/2015 ,  11/12/2015,and 04/14/2016-persistent anemia and red cell microcytosis, oral iron resumed 11/17/2016-stable  10. Pain/tenderness left posterior iliac region-resolved  11. Hypokalemia-started on potassium replacement 06/09/2016  12. History of skin rashand paronychiasecondary to [facility], she continues minocycline, moisturizers, and fluticasone as needed  13.Acute onset right low back pain 10/11/2017-likely secondary to obstruction of the right kidney;left ureter stent exchange and right ureter stent placement 10/13/2017  14.Left leg pain and edema-bilateral lower extremity venous Doppler negative for DVT1/15/2019 and 10/19/2017. Left common femoral DVT noted on CT venogram 10/27/2017;CT aspiration of pelvic cystic mass 10/30/2017 with subsequent marked improvement of leg edema.  15. Admission 10/22/2017 with bilateral pulmonary embolism, heparin drip initiated, converted to Lovenox 10/23/2017, converted to Xarelto during the 10/27/2017 hospital admission  16. Admission 10/27/2017 with increased back/leg pain and a fever-blood and urine cultures positive fora resistant E. coli    [name] appears unchanged.  She has enrolled in the [facility] hospice program.  She will continue close follow-up with the home hospice RN.  The hospice RN will change the Foley catheter as needed.  We discussed the risk/benefit of continuing Xarelto anticoagulation.  She will continue anticoagulation for now.  We will provide red cell transfusion support for symptomatic anemia.  She was transfused 2 units of packed red blood cells on 05/24/2018.  Ms. Baade will return for an office visit and CBC in 3  weeks.  She will contact us for symptoms of anemia in the interim.  [name] Coder, MD  06/09/2018  10:03 AM

## 2018-06-14 ENCOUNTER — Other Ambulatory Visit: Payer: BLUE CROSS/BLUE SHIELD | Admitting: Nurse Practitioner

## 2018-06-14 ENCOUNTER — Telehealth: Payer: Self-pay | Admitting: *Deleted

## 2018-06-14 NOTE — Telephone Encounter (Signed)
Received triage message from call center. Patient's hospice nurse called concerned about end of life care and pain medication. Tried numerous times to reach patient or return call to nurse. No answer. Generic message left on nurses cell phone. [phone].

## 2018-06-30 ENCOUNTER — Other Ambulatory Visit: Payer: BLUE CROSS/BLUE SHIELD

## 2018-06-30 ENCOUNTER — Ambulatory Visit: Payer: BLUE CROSS/BLUE SHIELD | Admitting: Oncology

## 2018-07-30 DEATH — deceased

## 2019-01-12 IMAGING — CT CT GUIDANCE NEEDLE PLACEMENT
1 of 5 series · 10 of 32 positions shown, 16 images · non-contrast
Comparison: CT-guided pelvic cyst aspiration - 01/26/2018 (yielding
600 cc);

INDICATION: History of symptomatic cystic pelvic neoplasm resulting in lower
extremity pain and edema.

Patient presents today for repeat CT-guided aspiration for
symptomatic relief.
Note, this is the patient fourth CT-guided pelvic cyst aspiration -
01/26/2018 (yielding 600 cc); 12/23/2017 (yielding 550 cc);
10/30/2017 (yielding 900 cc)
EXAM:
CT GUIDANCE NEEDLE PLACEMENT; CT CORE BIOPSY RENAL

[Series 2: i-spiral 5.0 b31f · axial · 0.86mm/px · z∈[-198,-65]mm · 10 of 48 slices shown, 16 images]
[im 5/48  soft-tissue]
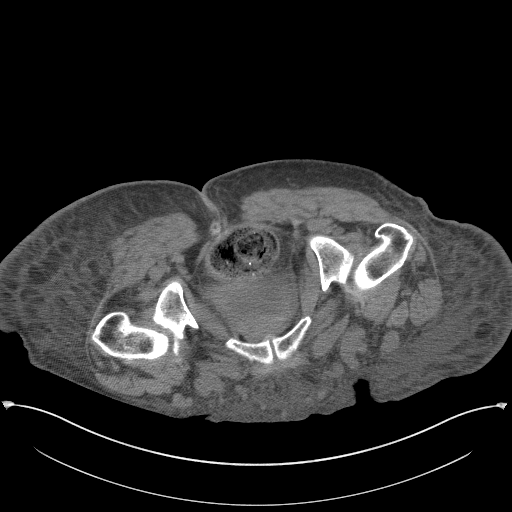
[im 5/48  bone]
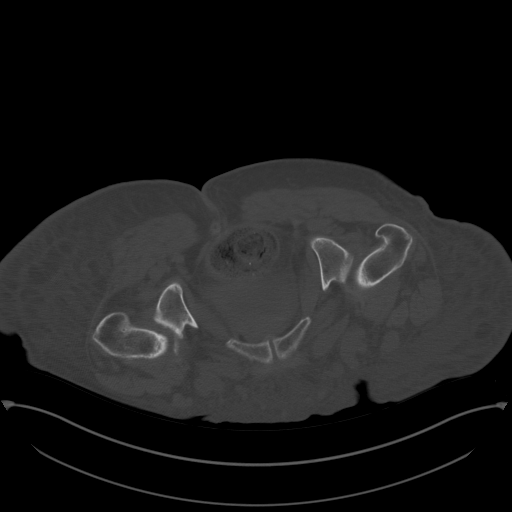
[im 9/48  soft-tissue]
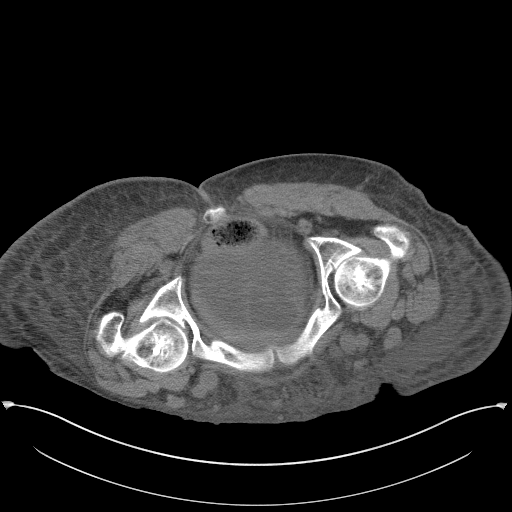
[im 13/48  soft-tissue]
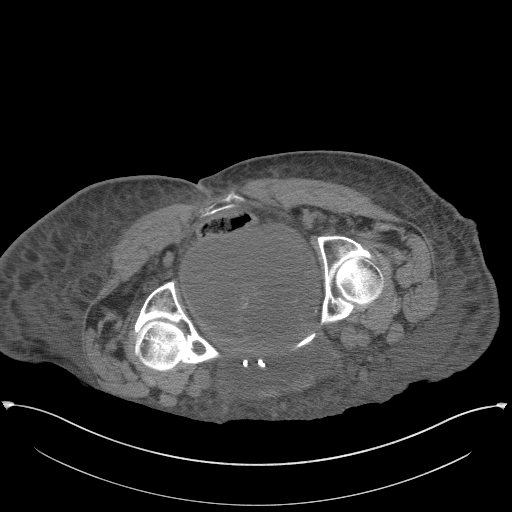
[im 18/48  soft-tissue]
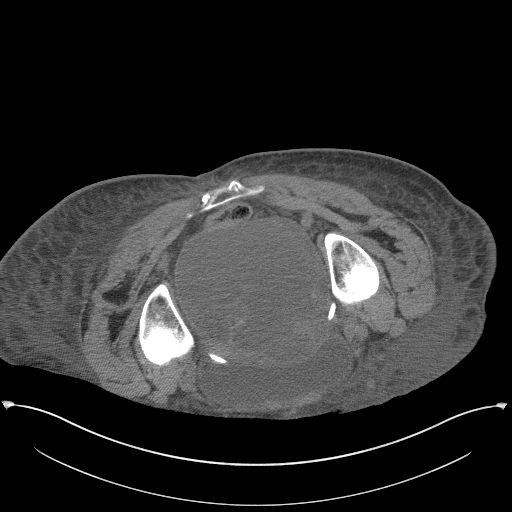
[im 22/48  soft-tissue]
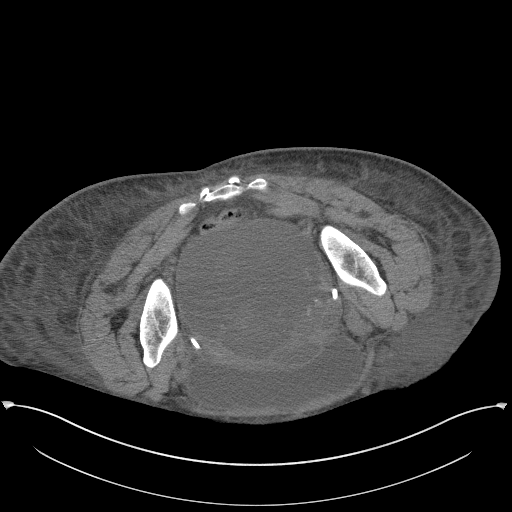
[im 26/48  soft-tissue]
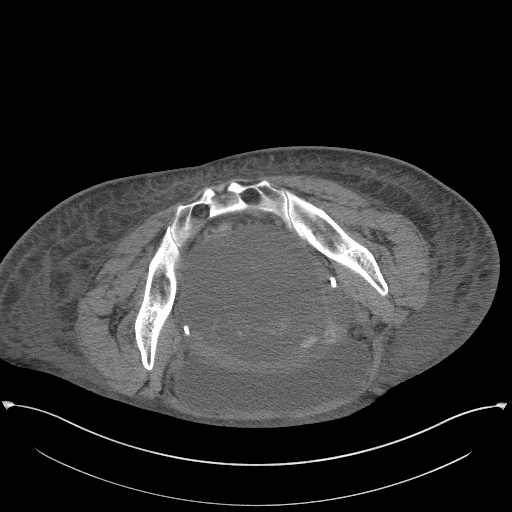
[im 30/48  soft-tissue]
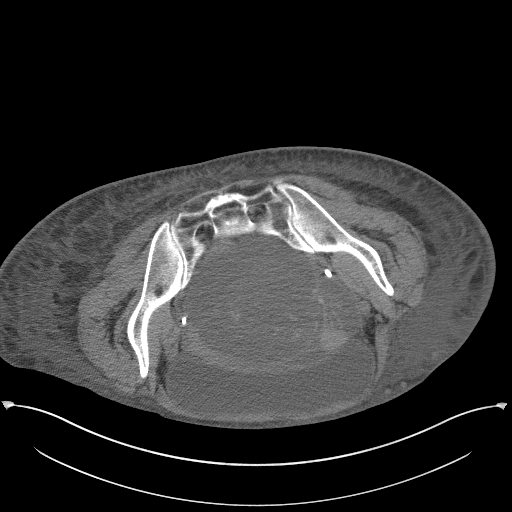
[im 30/48  lung]
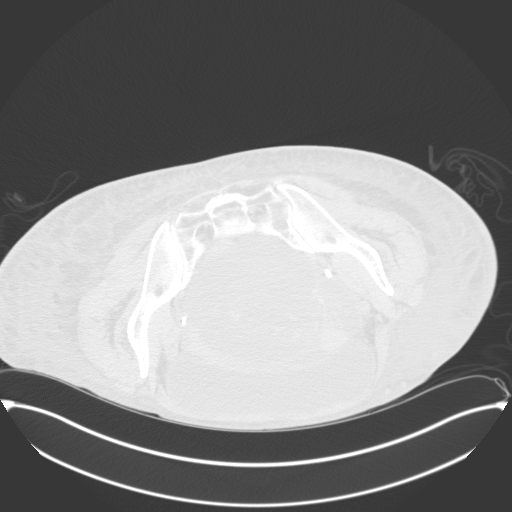
[im 35/48  soft-tissue]
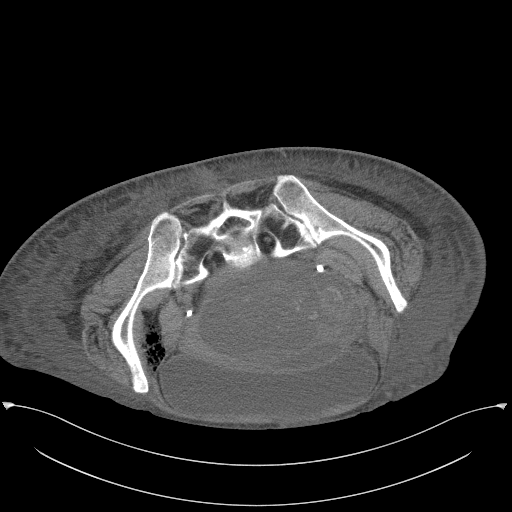
[im 35/48  lung]
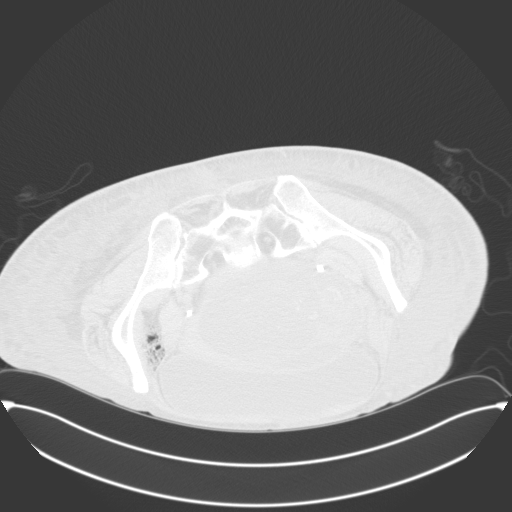
[im 39/48  soft-tissue]
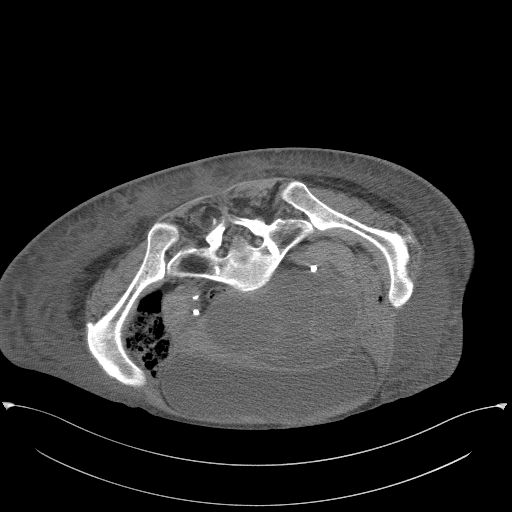
[im 39/48  lung]
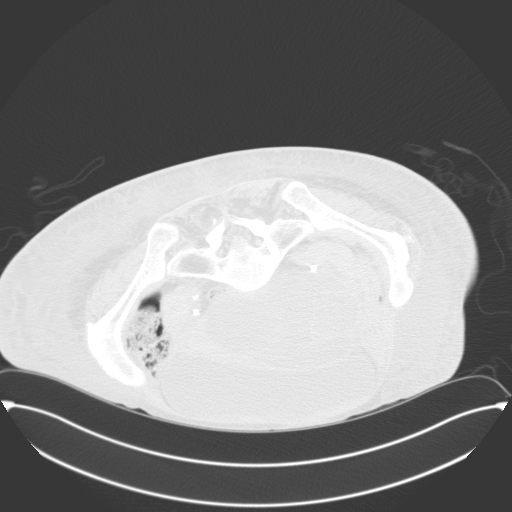
[im 39/48  bone]
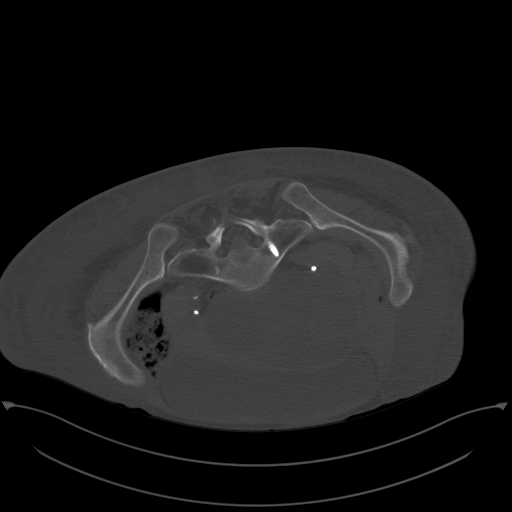
[im 43/48  soft-tissue]
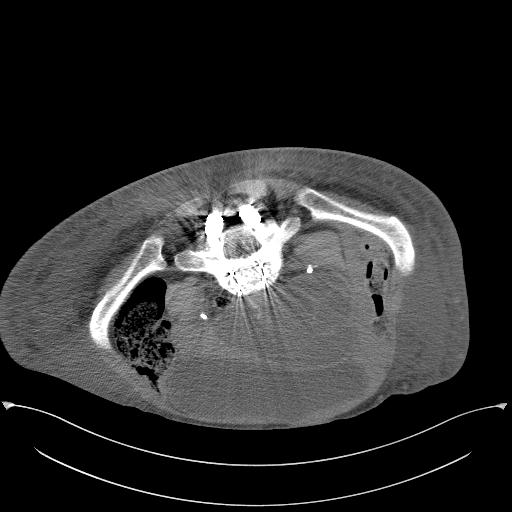
[im 43/48  lung]
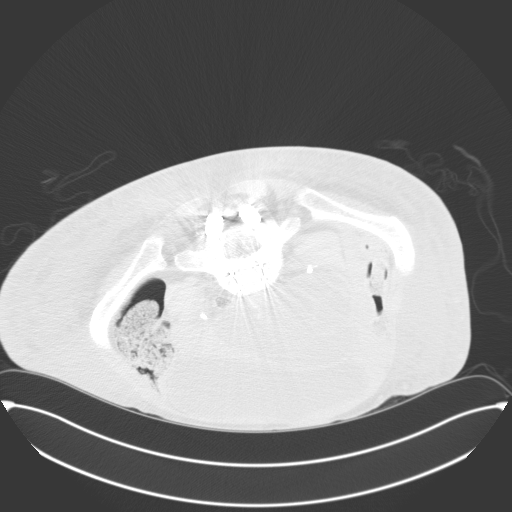

[10 of 32 positions shown; findings below may reference images not displayed]

12/23/2017 (yielding 550 cc); 10/30/2017 (yielding 900 cc);
pelvic CT-12/23/2017

MEDICATIONS:
None

ANESTHESIA/SEDATION:
Moderate (conscious) sedation was employed during this procedure. A
total of Versed 2.5 mg and Fentanyl 150 mcg was administered
intravenously.

Moderate Sedation Time: 29 minutes. The patient's level of
consciousness and vital signs were monitored continuously by
radiology nursing throughout the procedure under my direct
supervision.

CONTRAST:  None

COMPLICATIONS:
None immediate.

PROCEDURE:
Informed written consent was obtained from the patient after a
discussion of the risks, benefits and alternatives to treatment. The
patient was placed prone on the CT gantry and a pre procedural CT
was performed re-demonstrating the known complex pelvic cyst
measuring approximately 15.5 x 11.5 cm (image 22, series 2). The
procedure was planned. A timeout was performed prior to the
initiation of the procedure.

The skin overlying the left buttocks was prepped and draped in the
usual sterile fashion. The overlying soft tissues were anesthetized
with 1% lidocaine with epinephrine. Appropriate trajectory was
planned with the use of a 22 gauge spinal needle. An 18 gauge trocar
needle was advanced into the dominant component of the pelvic fluid
collection and a short Amplatz super stiff wire was coiled within
the collection. Appropriate positioning was confirmed with a limited
CT scan. The trocar needle was exchanged for a 15 cm Tanya Strachan
catheter. Approximately 500 cc of dark red fluid was aspirated from
this dominant component of the complex cystic mass.

Ultimately 2 additional complex cystic components were separately
targeted utilizing the above technique yielding another 250 cc of
similar-appearing dark red fluid.

Postprocedural imaging was obtained demonstrated significant
reduction in the overall size of the complex cystic mass, measuring
approximately 12.1 x 8.1 cm on postprocedural CT scan (image 9,
series 6). Superficial hemostasis was achieved with manual
compression. A dressing was placed. The patient tolerated the
procedure well without immediate post procedural complication.
IMPRESSION: Successful CT-guided aspiration of a total of approximately 750 cc
of dark red fluid from 3 dominant separate components of complex
symptomatic cystic pelvic mass.

Patient again reported marked improvement in her pelvic and lower
extremity pain following the pelvic cyst aspiration.
